# Patient Record
Sex: Male | Born: 1935
Health system: Southern US, Community
[De-identification: ages and names within clinical notes are randomized; demographics above are authoritative.]

## PROBLEM LIST (undated history)

## (undated) DIAGNOSIS — L309 Dermatitis, unspecified: Secondary | ICD-10-CM

## (undated) DIAGNOSIS — F419 Anxiety disorder, unspecified: Secondary | ICD-10-CM

## (undated) DIAGNOSIS — M1990 Unspecified osteoarthritis, unspecified site: Secondary | ICD-10-CM

## (undated) DIAGNOSIS — I739 Peripheral vascular disease, unspecified: Secondary | ICD-10-CM

## (undated) DIAGNOSIS — K219 Gastro-esophageal reflux disease without esophagitis: Secondary | ICD-10-CM

## (undated) DIAGNOSIS — M545 Low back pain, unspecified: Secondary | ICD-10-CM

## (undated) DIAGNOSIS — I447 Left bundle-branch block, unspecified: Secondary | ICD-10-CM

## (undated) DIAGNOSIS — C609 Malignant neoplasm of penis, unspecified: Secondary | ICD-10-CM

## (undated) DIAGNOSIS — D649 Anemia, unspecified: Secondary | ICD-10-CM

## (undated) DIAGNOSIS — H9193 Unspecified hearing loss, bilateral: Secondary | ICD-10-CM

## (undated) DIAGNOSIS — R351 Nocturia: Secondary | ICD-10-CM

## (undated) DIAGNOSIS — Z9289 Personal history of other medical treatment: Secondary | ICD-10-CM

## (undated) DIAGNOSIS — I442 Atrioventricular block, complete: Secondary | ICD-10-CM

## (undated) DIAGNOSIS — E039 Hypothyroidism, unspecified: Secondary | ICD-10-CM

## (undated) DIAGNOSIS — F329 Major depressive disorder, single episode, unspecified: Secondary | ICD-10-CM

## (undated) DIAGNOSIS — E785 Hyperlipidemia, unspecified: Secondary | ICD-10-CM

## (undated) DIAGNOSIS — S82002A Unspecified fracture of left patella, initial encounter for closed fracture: Secondary | ICD-10-CM

## (undated) DIAGNOSIS — G8929 Other chronic pain: Secondary | ICD-10-CM

## (undated) DIAGNOSIS — I10 Essential (primary) hypertension: Secondary | ICD-10-CM

## (undated) HISTORY — DX: Personal history of other medical treatment: Z92.89

## (undated) HISTORY — DX: Anemia, unspecified: D64.9

## (undated) HISTORY — DX: Unspecified fracture of left patella, initial encounter for closed fracture: S82.002A

## (undated) HISTORY — DX: Major depressive disorder, single episode, unspecified: F32.9

## (undated) HISTORY — DX: Gastro-esophageal reflux disease without esophagitis: K21.9

## (undated) HISTORY — DX: Dermatitis, unspecified: L30.9

## (undated) HISTORY — DX: Hyperlipidemia, unspecified: E78.5

## (undated) HISTORY — PX: OTHER SURGICAL HISTORY: SHX169

## (undated) HISTORY — DX: Essential (primary) hypertension: I10

## (undated) HISTORY — DX: Low back pain: M54.5

## (undated) HISTORY — PX: TONSILLECTOMY: SUR1361

## (undated) HISTORY — DX: Low back pain, unspecified: M54.50

## (undated) HISTORY — PX: INGUINAL HERNIA REPAIR: SUR1180

## (undated) HISTORY — DX: Other chronic pain: G89.29

## (undated) HISTORY — PX: INCISION AND DRAINAGE DEEP NECK ABSCESS: SHX1797

---

## 1997-11-05 ENCOUNTER — Other Ambulatory Visit: Admission: RE | Admit: 1997-11-05 | Discharge: 1997-11-05 | Payer: Self-pay | Admitting: Family Medicine

## 1998-10-31 ENCOUNTER — Other Ambulatory Visit: Admission: RE | Admit: 1998-10-31 | Discharge: 1998-10-31 | Payer: Self-pay | Admitting: Urology

## 2003-08-02 ENCOUNTER — Other Ambulatory Visit: Admission: RE | Admit: 2003-08-02 | Discharge: 2003-08-02 | Payer: Self-pay | Admitting: Dermatology

## 2004-05-29 ENCOUNTER — Ambulatory Visit: Payer: Self-pay | Admitting: Family Medicine

## 2004-06-11 ENCOUNTER — Ambulatory Visit: Payer: Self-pay | Admitting: Family Medicine

## 2004-09-17 ENCOUNTER — Ambulatory Visit: Payer: Self-pay | Admitting: Family Medicine

## 2005-04-28 ENCOUNTER — Ambulatory Visit: Payer: Self-pay | Admitting: Family Medicine

## 2005-05-01 ENCOUNTER — Ambulatory Visit: Payer: Self-pay

## 2006-06-01 ENCOUNTER — Ambulatory Visit: Payer: Self-pay | Admitting: Family Medicine

## 2006-06-01 LAB — CONVERTED CEMR LAB
ALT: 53 units/L — ABNORMAL HIGH (ref 0–40)
AST: 44 units/L — ABNORMAL HIGH (ref 0–37)
Albumin: 4.6 g/dL (ref 3.5–5.2)
Alkaline Phosphatase: 60 units/L (ref 39–117)
BUN: 17 mg/dL (ref 6–23)
Basophils Absolute: 0 10*3/uL (ref 0.0–0.1)
Basophils Relative: 0.5 % (ref 0.0–1.0)
CO2: 32 meq/L (ref 19–32)
Calcium: 9.8 mg/dL (ref 8.4–10.5)
Chloride: 101 meq/L (ref 96–112)
Chol/HDL Ratio, serum: 3.6
Cholesterol: 153 mg/dL (ref 0–200)
Creatinine, Ser: 1.3 mg/dL (ref 0.4–1.5)
Eosinophil percent: 1.5 % (ref 0.0–5.0)
GFR calc non Af Amer: 58 mL/min
Glomerular Filtration Rate, Af Am: 70 mL/min/{1.73_m2}
Glucose, Bld: 90 mg/dL (ref 70–99)
HCT: 48.5 % (ref 39.0–52.0)
HDL: 42.6 mg/dL (ref >39.0)
Hemoglobin: 16.1 g/dL (ref 13.0–17.0)
LDL Cholesterol: 85 mg/dL (ref 0–99)
Lymphocytes Relative: 19.3 % (ref 12.0–46.0)
MCHC: 33.3 g/dL (ref 30.0–36.0)
MCV: 81.1 fL (ref 78.0–100.0)
Monocytes Absolute: 0.7 10*3/uL (ref 0.2–0.7)
Monocytes Relative: 7.7 % (ref 3.0–11.0)
Neutro Abs: 6.3 10*3/uL (ref 1.4–7.7)
Neutrophils Relative %: 71 % (ref 43.0–77.0)
PSA: 3.55 ng/mL (ref 0.10–4.00)
Platelets: 166 10*3/uL (ref 150–400)
Potassium: 3.5 meq/L (ref 3.5–5.1)
RBC: 5.97 M/uL — ABNORMAL HIGH (ref 4.22–5.81)
RDW: 13.1 % (ref 11.5–14.6)
Sodium: 143 meq/L (ref 135–145)
TSH: 3.78 microintl units/mL (ref 0.35–5.50)
Total Bilirubin: 0.7 mg/dL (ref 0.3–1.2)
Total Protein: 7 g/dL (ref 6.0–8.3)
Triglyceride fasting, serum: 128 mg/dL (ref 0–149)
VLDL: 26 mg/dL (ref 0–40)
WBC: 8.8 10*3/uL (ref 4.5–10.5)

## 2006-06-09 ENCOUNTER — Ambulatory Visit: Payer: Self-pay | Admitting: Family Medicine

## 2006-06-09 ENCOUNTER — Encounter: Admission: RE | Admit: 2006-06-09 | Discharge: 2006-06-09 | Payer: Self-pay | Admitting: Family Medicine

## 2006-09-09 ENCOUNTER — Ambulatory Visit: Payer: Self-pay | Admitting: Family Medicine

## 2006-09-09 LAB — CONVERTED CEMR LAB
ALT: 40 units/L (ref 0–40)
AST: 38 units/L — ABNORMAL HIGH (ref 0–37)
Albumin: 4.3 g/dL (ref 3.5–5.2)
Alkaline Phosphatase: 56 units/L (ref 39–117)
Bilirubin, Direct: 0.2 mg/dL (ref 0.0–0.3)
Total Bilirubin: 0.8 mg/dL (ref 0.3–1.2)
Total Protein: 6.6 g/dL (ref 6.0–8.3)

## 2006-09-15 ENCOUNTER — Ambulatory Visit: Payer: Self-pay | Admitting: Family Medicine

## 2006-09-16 ENCOUNTER — Ambulatory Visit: Payer: Self-pay | Admitting: Family Medicine

## 2006-09-16 LAB — CONVERTED CEMR LAB
BUN: 15 mg/dL (ref 6–23)
Bacteria, UA: NEGATIVE
Bilirubin Urine: NEGATIVE
Creatinine, Ser: 1.3 mg/dL (ref 0.4–1.5)
Crystals: NEGATIVE
Hemoglobin, Urine: NEGATIVE
Ketones, ur: NEGATIVE mg/dL
Leukocytes, UA: NEGATIVE
Mucus, UA: NEGATIVE
Nitrite: NEGATIVE
RBC / HPF: NONE SEEN
Specific Gravity, Urine: <=1.005 (ref 1.000–1.03)
Total Protein, Urine: NEGATIVE mg/dL
Urine Glucose: NEGATIVE mg/dL
Urobilinogen, UA: 0.2 (ref 0.0–1.0)
pH: 6 (ref 5.0–8.0)

## 2006-09-17 ENCOUNTER — Ambulatory Visit: Payer: Self-pay | Admitting: Cardiology

## 2006-11-24 ENCOUNTER — Ambulatory Visit: Payer: Self-pay | Admitting: Family Medicine

## 2007-02-02 DIAGNOSIS — K219 Gastro-esophageal reflux disease without esophagitis: Secondary | ICD-10-CM | POA: Insufficient documentation

## 2007-02-02 DIAGNOSIS — E785 Hyperlipidemia, unspecified: Secondary | ICD-10-CM | POA: Insufficient documentation

## 2007-02-02 DIAGNOSIS — I10 Essential (primary) hypertension: Secondary | ICD-10-CM

## 2007-02-02 DIAGNOSIS — E782 Mixed hyperlipidemia: Secondary | ICD-10-CM | POA: Insufficient documentation

## 2007-02-02 DIAGNOSIS — F324 Major depressive disorder, single episode, in partial remission: Secondary | ICD-10-CM

## 2007-02-02 DIAGNOSIS — J309 Allergic rhinitis, unspecified: Secondary | ICD-10-CM | POA: Insufficient documentation

## 2007-02-02 DIAGNOSIS — F325 Major depressive disorder, single episode, in full remission: Secondary | ICD-10-CM | POA: Insufficient documentation

## 2007-02-02 HISTORY — DX: Essential (primary) hypertension: I10

## 2007-02-02 HISTORY — DX: Allergic rhinitis, unspecified: J30.9

## 2007-02-02 HISTORY — DX: Major depressive disorder, single episode, in partial remission: F32.4

## 2008-01-19 ENCOUNTER — Ambulatory Visit: Payer: Self-pay | Admitting: Family Medicine

## 2008-01-19 DIAGNOSIS — L2089 Other atopic dermatitis: Secondary | ICD-10-CM | POA: Insufficient documentation

## 2008-01-19 DIAGNOSIS — N410 Acute prostatitis: Secondary | ICD-10-CM | POA: Insufficient documentation

## 2008-01-19 DIAGNOSIS — R3915 Urgency of urination: Secondary | ICD-10-CM | POA: Insufficient documentation

## 2008-01-19 LAB — CONVERTED CEMR LAB
Bilirubin Urine: NEGATIVE
Blood in Urine, dipstick: NEGATIVE
Glucose, Urine, Semiquant: NEGATIVE
Ketones, urine, test strip: NEGATIVE
Nitrite: NEGATIVE
Protein, U semiquant: NEGATIVE
Specific Gravity, Urine: 1.015
Urobilinogen, UA: 0.2
WBC Urine, dipstick: NEGATIVE
pH: 7

## 2008-01-24 ENCOUNTER — Telehealth: Payer: Self-pay | Admitting: Family Medicine

## 2008-02-02 ENCOUNTER — Ambulatory Visit: Payer: Self-pay | Admitting: Family Medicine

## 2008-02-02 DIAGNOSIS — L259 Unspecified contact dermatitis, unspecified cause: Secondary | ICD-10-CM | POA: Insufficient documentation

## 2008-08-15 ENCOUNTER — Ambulatory Visit: Payer: Self-pay | Admitting: Family Medicine

## 2008-08-15 DIAGNOSIS — T50995A Adverse effect of other drugs, medicaments and biological substances, initial encounter: Secondary | ICD-10-CM | POA: Insufficient documentation

## 2008-08-15 DIAGNOSIS — D649 Anemia, unspecified: Secondary | ICD-10-CM | POA: Insufficient documentation

## 2008-08-15 DIAGNOSIS — D409 Neoplasm of uncertain behavior of male genital organ, unspecified: Secondary | ICD-10-CM | POA: Insufficient documentation

## 2008-08-15 DIAGNOSIS — E039 Hypothyroidism, unspecified: Secondary | ICD-10-CM | POA: Insufficient documentation

## 2008-08-15 DIAGNOSIS — E559 Vitamin D deficiency, unspecified: Secondary | ICD-10-CM | POA: Insufficient documentation

## 2008-08-15 DIAGNOSIS — I878 Other specified disorders of veins: Secondary | ICD-10-CM | POA: Insufficient documentation

## 2008-08-15 HISTORY — DX: Vitamin D deficiency, unspecified: E55.9

## 2008-09-26 ENCOUNTER — Telehealth: Payer: Self-pay | Admitting: Family Medicine

## 2009-08-13 ENCOUNTER — Ambulatory Visit: Payer: Self-pay | Admitting: Family Medicine

## 2009-08-13 DIAGNOSIS — H1030 Unspecified acute conjunctivitis, unspecified eye: Secondary | ICD-10-CM | POA: Insufficient documentation

## 2009-09-05 ENCOUNTER — Ambulatory Visit: Payer: Self-pay | Admitting: Family Medicine

## 2009-09-05 DIAGNOSIS — R350 Frequency of micturition: Secondary | ICD-10-CM

## 2009-09-05 HISTORY — DX: Frequency of micturition: R35.0

## 2009-10-10 ENCOUNTER — Telehealth: Payer: Self-pay | Admitting: Family Medicine

## 2009-11-20 ENCOUNTER — Telehealth: Payer: Self-pay | Admitting: Family Medicine

## 2010-03-19 ENCOUNTER — Ambulatory Visit: Payer: Self-pay | Admitting: Family Medicine

## 2010-03-19 DIAGNOSIS — K5900 Constipation, unspecified: Secondary | ICD-10-CM | POA: Insufficient documentation

## 2010-03-19 DIAGNOSIS — K1370 Unspecified lesions of oral mucosa: Secondary | ICD-10-CM | POA: Insufficient documentation

## 2010-03-19 DIAGNOSIS — M545 Low back pain: Secondary | ICD-10-CM

## 2010-03-19 DIAGNOSIS — M542 Cervicalgia: Secondary | ICD-10-CM | POA: Insufficient documentation

## 2010-03-21 DIAGNOSIS — M4802 Spinal stenosis, cervical region: Secondary | ICD-10-CM | POA: Insufficient documentation

## 2010-03-21 HISTORY — DX: Spinal stenosis, cervical region: M48.02

## 2010-04-23 ENCOUNTER — Ambulatory Visit: Payer: Self-pay | Admitting: Family Medicine

## 2010-04-23 DIAGNOSIS — B379 Candidiasis, unspecified: Secondary | ICD-10-CM | POA: Insufficient documentation

## 2010-04-23 DIAGNOSIS — R269 Unspecified abnormalities of gait and mobility: Secondary | ICD-10-CM | POA: Insufficient documentation

## 2010-04-30 ENCOUNTER — Encounter: Payer: Self-pay | Admitting: Family Medicine

## 2010-05-08 ENCOUNTER — Ambulatory Visit (HOSPITAL_BASED_OUTPATIENT_CLINIC_OR_DEPARTMENT_OTHER): Admission: RE | Admit: 2010-05-08 | Discharge: 2010-05-08 | Payer: Self-pay | Admitting: Urology

## 2010-05-09 HISTORY — PX: OTHER SURGICAL HISTORY: SHX169

## 2010-05-12 ENCOUNTER — Ambulatory Visit: Payer: Self-pay | Admitting: Family Medicine

## 2010-05-12 DIAGNOSIS — I447 Left bundle-branch block, unspecified: Secondary | ICD-10-CM | POA: Insufficient documentation

## 2010-05-13 DIAGNOSIS — C601 Malignant neoplasm of glans penis: Secondary | ICD-10-CM

## 2010-05-13 DIAGNOSIS — C609 Malignant neoplasm of penis, unspecified: Secondary | ICD-10-CM

## 2010-05-13 HISTORY — DX: Malignant neoplasm of penis, unspecified: C60.9

## 2010-05-13 HISTORY — DX: Malignant neoplasm of glans penis: C60.1

## 2010-05-13 LAB — CONVERTED CEMR LAB
ALT: 27 units/L (ref 0–53)
AST: 33 units/L (ref 0–37)
Albumin: 4.3 g/dL (ref 3.5–5.2)
Alkaline Phosphatase: 56 units/L (ref 39–117)
BUN: 20 mg/dL (ref 6–23)
Basophils Absolute: 0 10*3/uL (ref 0.0–0.1)
Basophils Relative: 0.3 % (ref 0.0–3.0)
Bilirubin, Direct: 0.1 mg/dL (ref 0.0–0.3)
CO2: 29 meq/L (ref 19–32)
Calcium: 9 mg/dL (ref 8.4–10.5)
Chloride: 98 meq/L (ref 96–112)
Creatinine, Ser: 1.3 mg/dL (ref 0.4–1.5)
Eosinophils Absolute: 0.1 10*3/uL (ref 0.0–0.7)
Eosinophils Relative: 0.8 % (ref 0.0–5.0)
GFR calc non Af Amer: 55.84 mL/min (ref >60)
Glucose, Bld: 81 mg/dL (ref 70–99)
HCT: 45.4 % (ref 39.0–52.0)
Hemoglobin: 15.7 g/dL (ref 13.0–17.0)
Lymphocytes Relative: 26.8 % (ref 12.0–46.0)
Lymphs Abs: 2.2 10*3/uL (ref 0.7–4.0)
MCHC: 34.5 g/dL (ref 30.0–36.0)
MCV: 80.3 fL (ref 78.0–100.0)
Monocytes Absolute: 0.6 10*3/uL (ref 0.1–1.0)
Monocytes Relative: 7.4 % (ref 3.0–12.0)
Neutro Abs: 5.4 10*3/uL (ref 1.4–7.7)
Neutrophils Relative %: 64.7 % (ref 43.0–77.0)
Phosphorus: 2.7 mg/dL (ref 2.3–4.6)
Platelets: 122 10*3/uL — ABNORMAL LOW (ref 150.0–400.0)
Potassium: 3.5 meq/L (ref 3.5–5.1)
RBC: 5.66 M/uL (ref 4.22–5.81)
RDW: 15 % — ABNORMAL HIGH (ref 11.5–14.6)
Sodium: 142 meq/L (ref 135–145)
TSH: 3.31 microintl units/mL (ref 0.35–5.50)
Total Bilirubin: 0.7 mg/dL (ref 0.3–1.2)
Total Protein: 6.8 g/dL (ref 6.0–8.3)
WBC: 8.3 10*3/uL (ref 4.5–10.5)

## 2010-05-14 ENCOUNTER — Encounter: Payer: Self-pay | Admitting: Family Medicine

## 2010-05-14 ENCOUNTER — Ambulatory Visit: Payer: Self-pay | Admitting: Cardiology

## 2010-05-14 DIAGNOSIS — R9431 Abnormal electrocardiogram [ECG] [EKG]: Secondary | ICD-10-CM | POA: Insufficient documentation

## 2010-05-15 ENCOUNTER — Encounter: Payer: Self-pay | Admitting: Cardiology

## 2010-05-16 ENCOUNTER — Telehealth: Payer: Self-pay | Admitting: Cardiology

## 2010-05-27 ENCOUNTER — Telehealth (INDEPENDENT_AMBULATORY_CARE_PROVIDER_SITE_OTHER): Payer: Self-pay | Admitting: *Deleted

## 2010-05-28 ENCOUNTER — Ambulatory Visit: Payer: Self-pay

## 2010-05-28 ENCOUNTER — Encounter (HOSPITAL_COMMUNITY)
Admission: RE | Admit: 2010-05-28 | Discharge: 2010-07-12 | Payer: Self-pay | Source: Home / Self Care | Attending: Cardiology | Admitting: Cardiology

## 2010-05-28 ENCOUNTER — Encounter: Payer: Self-pay | Admitting: Cardiovascular Disease

## 2010-05-28 ENCOUNTER — Ambulatory Visit: Payer: Self-pay | Admitting: Cardiovascular Disease

## 2010-05-29 ENCOUNTER — Encounter (INDEPENDENT_AMBULATORY_CARE_PROVIDER_SITE_OTHER): Payer: Self-pay | Admitting: *Deleted

## 2010-06-02 ENCOUNTER — Ambulatory Visit (HOSPITAL_BASED_OUTPATIENT_CLINIC_OR_DEPARTMENT_OTHER): Admission: RE | Admit: 2010-06-02 | Discharge: 2010-06-02 | Payer: Self-pay | Admitting: Urology

## 2010-06-02 HISTORY — PX: OTHER SURGICAL HISTORY: SHX169

## 2010-06-11 ENCOUNTER — Encounter: Payer: Self-pay | Admitting: Cardiology

## 2010-06-24 ENCOUNTER — Ambulatory Visit: Payer: Self-pay | Admitting: Internal Medicine

## 2010-06-24 DIAGNOSIS — L0390 Cellulitis, unspecified: Secondary | ICD-10-CM

## 2010-06-24 DIAGNOSIS — L0291 Cutaneous abscess, unspecified: Secondary | ICD-10-CM | POA: Insufficient documentation

## 2010-06-27 ENCOUNTER — Encounter
Admission: RE | Admit: 2010-06-27 | Discharge: 2010-06-27 | Payer: Self-pay | Source: Home / Self Care | Attending: Neurosurgery | Admitting: Neurosurgery

## 2010-07-03 ENCOUNTER — Encounter: Payer: Self-pay | Admitting: Family Medicine

## 2010-08-10 LAB — CONVERTED CEMR LAB
ALT: 31 units/L (ref 0–53)
ALT: 32 units/L (ref 0–53)
AST: 33 units/L (ref 0–37)
AST: 34 units/L (ref 0–37)
Albumin: 4.4 g/dL (ref 3.5–5.2)
Albumin: 4.9 g/dL (ref 3.5–5.2)
Alkaline Phosphatase: 55 units/L (ref 39–117)
Alkaline Phosphatase: 64 units/L (ref 39–117)
BUN: 15 mg/dL (ref 6–23)
BUN: 18 mg/dL (ref 6–23)
Basophils Absolute: 0 10*3/uL (ref 0.0–0.1)
Basophils Absolute: 0 10*3/uL (ref 0.0–0.1)
Basophils Relative: 0.2 % (ref 0.0–3.0)
Basophils Relative: 0.2 % (ref 0.0–3.0)
Bilirubin Urine: NEGATIVE
Bilirubin Urine: NEGATIVE
Bilirubin, Direct: 0.1 mg/dL (ref 0.0–0.3)
Bilirubin, Direct: 0.2 mg/dL (ref 0.0–0.3)
Blood in Urine, dipstick: NEGATIVE
Blood in Urine, dipstick: NEGATIVE
CO2: 29 meq/L (ref 19–32)
CO2: 30 meq/L (ref 19–32)
Calcium: 9.7 mg/dL (ref 8.4–10.5)
Calcium: 9.7 mg/dL (ref 8.4–10.5)
Chloride: 104 meq/L (ref 96–112)
Chloride: 105 meq/L (ref 96–112)
Cholesterol: 144 mg/dL (ref 0–200)
Cholesterol: 146 mg/dL (ref 0–200)
Creatinine, Ser: 1.3 mg/dL (ref 0.4–1.5)
Creatinine, Ser: 1.5 mg/dL (ref 0.4–1.5)
Direct LDL: 93.8 mg/dL
Eosinophils Absolute: 0.1 10*3/uL (ref 0.0–0.7)
Eosinophils Absolute: 0.1 10*3/uL (ref 0.0–0.7)
Eosinophils Relative: 0.9 % (ref 0.0–5.0)
Eosinophils Relative: 0.9 % (ref 0.0–5.0)
GFR calc Af Amer: 70 mL/min
GFR calc non Af Amer: 48.7 mL/min (ref >60)
GFR calc non Af Amer: 58 mL/min
Glucose, Bld: 116 mg/dL — ABNORMAL HIGH (ref 70–99)
Glucose, Bld: 95 mg/dL (ref 70–99)
Glucose, Urine, Semiquant: NEGATIVE
Glucose, Urine, Semiquant: NEGATIVE
HCT: 47 % (ref 39.0–52.0)
HCT: 48.6 % (ref 39.0–52.0)
HDL: 35.1 mg/dL — ABNORMAL LOW (ref >39.0)
HDL: 41.8 mg/dL (ref >39.00)
Hemoglobin: 15.7 g/dL (ref 13.0–17.0)
Hemoglobin: 16.2 g/dL (ref 13.0–17.0)
Ketones, urine, test strip: NEGATIVE
Ketones, urine, test strip: NEGATIVE
LDL Cholesterol: 73 mg/dL (ref 0–99)
Lymphocytes Relative: 19.6 % (ref 12.0–46.0)
Lymphocytes Relative: 23.8 % (ref 12.0–46.0)
Lymphs Abs: 1.6 10*3/uL (ref 0.7–4.0)
MCHC: 32.3 g/dL (ref 30.0–36.0)
MCHC: 34.6 g/dL (ref 30.0–36.0)
MCV: 78 fL (ref 78.0–100.0)
MCV: 82.6 fL (ref 78.0–100.0)
Monocytes Absolute: 0.6 10*3/uL (ref 0.1–1.0)
Monocytes Absolute: 0.7 10*3/uL (ref 0.1–1.0)
Monocytes Relative: 7.9 % (ref 3.0–12.0)
Monocytes Relative: 8.6 % (ref 3.0–12.0)
Neutro Abs: 4.7 10*3/uL (ref 1.4–7.7)
Neutro Abs: 5.7 10*3/uL (ref 1.4–7.7)
Neutrophils Relative %: 67.2 % (ref 43.0–77.0)
Neutrophils Relative %: 70.7 % (ref 43.0–77.0)
Nitrite: NEGATIVE
Nitrite: NEGATIVE
PSA: 3.21 ng/mL (ref 0.10–4.00)
PSA: 3.26 ng/mL (ref 0.10–4.00)
Platelets: 115 10*3/uL — ABNORMAL LOW (ref 150.0–400.0)
Platelets: 124 10*3/uL — ABNORMAL LOW (ref 150–400)
Potassium: 3.5 meq/L (ref 3.5–5.1)
Potassium: 3.6 meq/L (ref 3.5–5.1)
Protein, U semiquant: NEGATIVE
RBC: 5.88 M/uL — ABNORMAL HIGH (ref 4.22–5.81)
RBC: 6.03 M/uL — ABNORMAL HIGH (ref 4.22–5.81)
RDW: 13 % (ref 11.5–14.6)
RDW: 13.1 % (ref 11.5–14.6)
Sodium: 141 meq/L (ref 135–145)
Sodium: 143 meq/L (ref 135–145)
Specific Gravity, Urine: 1.015
Specific Gravity, Urine: 1.015
TSH: 2.97 microintl units/mL (ref 0.35–5.50)
TSH: 3.21 microintl units/mL (ref 0.35–5.50)
Total Bilirubin: 0.4 mg/dL (ref 0.3–1.2)
Total Bilirubin: 0.7 mg/dL (ref 0.3–1.2)
Total CHOL/HDL Ratio: 3
Total CHOL/HDL Ratio: 4.1
Total Protein: 6.8 g/dL (ref 6.0–8.3)
Total Protein: 7.4 g/dL (ref 6.0–8.3)
Triglycerides: 182 mg/dL — ABNORMAL HIGH (ref 0–149)
Triglycerides: 218 mg/dL — ABNORMAL HIGH (ref 0.0–149.0)
Urobilinogen, UA: 0.2
Urobilinogen, UA: 0.2
VLDL: 36 mg/dL (ref 0–40)
VLDL: 43.6 mg/dL — ABNORMAL HIGH (ref 0.0–40.0)
Vit D, 1,25-Dihydroxy: 29 — ABNORMAL LOW (ref 30–89)
Vit D, 25-Hydroxy: 40 ng/mL (ref 30–89)
WBC: 7.1 10*3/uL (ref 4.5–10.5)
WBC: 8.1 10*3/uL (ref 4.5–10.5)
pH: 6.5
pH: 7

## 2010-08-14 NOTE — Letter (Signed)
Summary: [facility] Office Visit Note   [facility] Office Visit Note   Imported By: Roderic Ovens 05/29/2010 16:28:34  _____________________________________________________________________  External Attachment:    Type:   Image     Comment:   External Document

## 2010-08-14 NOTE — Assessment & Plan Note (Signed)
Summary: np6/ LBBB ON EXAM -SURGERY CLEARANCE - PT HAS SECURE HORIZONE...   Visit Type:  Initial Consult   History of Present Illness: 77 year old male for evaluation of abnormal electrocardiogram and preoperatively. No prior cardiac history. Patient recently noted to have left bundle branch block on electrocardiogram. He does note increased dyspnea on exertion for one year. It is relieved with rest. It is not associated with chest pain. There is no orthopnea or PND but there is occasional mild pedal edema which improves with diuretics. He does not have exertional chest pain, palpitations or syncope. Note he may also require a urological procedure. Because of his abnormal electrocardiogram we were asked to further evaluate.  Current Medications (verified): 1)  Lorcet 10/650 10-650 Mg Tabs (Hydrocodone-Acetaminophen) .Marland Kitchen.. 1 Tablet By Mouth Every Four To Six Hours 2)  Zoloft 100 Mg  Tabs (Sertraline Hcl) .... Two Times A Day 3)  Amitriptyline Hcl 100 Mg  Tabs (Amitriptyline Hcl) .... Take 1 At Bedtime 4)  Meprobamate 400 Mg  Tabs (Meprobamate) .Marland Kitchen.. 1 By Mouth Four Times A Day As Needed 5)  Baby Aspirin 81 Mg  Chew (Aspirin) .Marland Kitchen.. 1 Once Daily 6)  Triamterene-Hctz 75-50 Mg  Tabs (Triamterene-Hctz) .Marland Kitchen.. 1 By Mouth Once Daily 7)  Prilosec 20 Mg  Cpdr (Omeprazole) .... Otc 8)  Tobramycin Sulfate 0.3 % Soln (Tobramycin Sulfate) .Marland Kitchen.. 1 Qtt in Left Eye Tid 9)  Bactroban 2 % Oint (Mupirocin) .... Apply To Skin Below Left Eye Tid 10)  Vesicare 5 Mg Tabs (Solifenacin Succinate) .Marland Kitchen.. 1 By Mouth Once Daily 11)  Valtrex 500 Mg Tabs (Valacyclovir Hcl) .... 2 Tabs By Mouth Three Times A Day  For Herpes Infection X 7 Days and Then Decrease Back To 1 Bid 12)  First-Dukes Mouthwash  Susp (Diphenhyd-Hydrocort-Nystatin) .... 5 Cc By Mouth Qid As Needed Lesions X 14 Days 13)  Ketoconazole 2 % Crea (Ketoconazole) .... Apply Two Times A Day As Needed Rash 14)  Simvastatin 40 Mg Tabs (Simvastatin) .... Take 1/2 At  Night 15)  Furosemide 20 Mg Tabs (Furosemide) .Marland Kitchen.. 1 Tab By Mouth Daily As Needed For Sob/edema/wt Gain > 3# in 24 Hr  Allergies: 1)  Sulfamethoxazole (Sulfamethoxazole)  Past History:  Past Medical History: HYPERTENSION  HYPERLIPIDEMIA LBBB  SPINAL STENOSIS, CERVICAL LOW BACK PAIN, CHRONIC ECZEMA  VITAMIN D DEFICIENCY ANEMIA  GERD  DEPRESSION  Penile lesion H[facility]  Past Surgical History: Rt wrist tumor hrnioraphy early 70s Neck abscess drained tonsillectomy  Family History: Reviewed history from 02/02/2007 and no changes required. Family History of Arthritis Family History High cholesterol Family History Hypertension Family History Other cancer-Prostate No premature CAD  Social History: Reviewed history from 02/02/2007 and no changes required. Retired Divorced Never Smoked Alcohol use-no Drug use-no Regular exercise-no  Review of Systems       Problems with back pain but no fevers or chills, productive cough, hemoptysis, dysphasia, odynophagia, melena, hematochezia, dysuria, hematuria, rash, seizure activity, orthopnea, PND,  claudication. Remaining systems are negative.   Vital Signs:  Patient profile:   77 year old male Height:      71 inches Weight:      234 pounds Pulse rate:   76 / minute Pulse rhythm:   regular BP sitting:   140 / 84  (left arm)  Vitals Entered By: Jacquelin Hawking, CMA (May 14, 2010 2:53 PM)  Physical Exam  General:  Well developed/well nourished in NAD Skin warm/dry; multiple moles Patient not depressed No peripheral clubbing Back-normal  HEENT-normal/normal eyelids Neck supple/normal carotid upstroke bilaterally; no bruits; no JVD; no thyromegaly chest - CTA/ normal expansion CV - RRR/normal S1 and S2; no murmurs, rubs or gallops;  PMI nondisplaced Abdomen -NT/ND, no HSM, no mass, + bowel sounds, no bruit 2+ femoral pulses, no bruits Ext-no edema, chords, 2+ DP Neuro-grossly  nonfocal     EKG  Procedure date:  05/14/2010  Findings:      Sinus rhythm at a rate of 78. Left bundle branch block.  Impression & Recommendations:  Problem # 1:  LBBB (ICD-426.3) Also with complaints of dyspnea. Schedule lexiscan myoview for risk stratification. His updated medication list for this problem includes:    Baby Aspirin 81 Mg Chew (Aspirin) .Marland Kitchen... 1 once daily  Problem # 2:  HYPERTENSION (ICD-401.9) Blood pressure controlled on present medications. Will continue. His updated medication list for this problem includes:    Baby Aspirin 81 Mg Chew (Aspirin) .Marland Kitchen... 1 once daily    Triamterene-hctz 75-50 Mg Tabs (Triamterene-hctz) .Marland Kitchen... 1 by mouth once daily    Furosemide 20 Mg Tabs (Furosemide) .Marland Kitchen... 1 tab by mouth daily as needed for sob/edema/wt gain > 3# in 24 hr  Orders: EKG w/ Interpretation (93000)  Problem # 3:  HYPERLIPIDEMIA (ICD-272.4) Management per primary care. His updated medication list for this problem includes:    Simvastatin 40 Mg Tabs (Simvastatin) .Marland Kitchen... Take 1/2 at night  Problem # 4:  GERD (ICD-530.81)  His updated medication list for this problem includes:    Prilosec 20 Mg Cpdr (Omeprazole) ..... Otc  His updated medication list for this problem includes:    Prilosec 20 Mg Cpdr (Omeprazole) ..... Otc  Other Orders: Nuclear Stress Test (Nuc Stress Test)  Patient Instructions: 1)  Your physician recommends that you schedule a follow-up appointment in: AS NEEDED PENDING TEST RESULTS 2)  Your physician has requested that you have an LEXISCAN myoview.  For further information please visit [email or web address]  Please follow instruction sheet, as given.

## 2010-08-14 NOTE — Assessment & Plan Note (Signed)
Summary: eye check/jlp   Vital Signs:  Patient profile:   78 year old male Weight:      245 pounds Temp:     98 degrees F BP sitting:   146 / 60  Vitals Entered By: Lynann Beaver CMA (August 13, 2009 5:17 PM) CC: Left eye pain Is Patient Diabetic? No Pain Assessment Patient in pain? no        History of Present Illness: This 77 year old white male patient with multiple problems comes in today cause of redness of the left eye as well as some pain and swollen area around the left eye and this is all occurred in the past 2 days His blood pressure has been fairly well controlled he has no other complaints at this time Discussed the fact that he needs a evaluation of his problems in the near future as necessary lab studies Is probable peripheral edema had been under good control Urinary didn't see as decreased  Allergies: 1)  Sulfamethoxazole (Sulfamethoxazole)  Past History:  Past Medical History: Last updated: 02/02/2007 Allergic rhinitis Depression GERD Hyperlipidemia Hypertension HOH-[facility]  Past Surgical History: Last updated: 02/02/2007 Rt wrist tumor  Social History: Last updated: 02/02/2007 Retired Divorced Never Smoked Alcohol use-no Drug use-no Regular exercise-no  Risk Factors: Smoking Status: never (02/02/2007)  Review of Systems  The patient denies anorexia, fever, weight loss, weight gain, vision loss, decreased hearing, hoarseness, chest pain, syncope, dyspnea on exertion, peripheral edema, prolonged cough, headaches, hemoptysis, abdominal pain, melena, hematochezia, severe indigestion/heartburn, hematuria, incontinence, genital sores, muscle weakness, suspicious skin lesions, transient blindness, difficulty walking, depression, unusual weight change, abnormal bleeding, enlarged lymph nodes, angioedema, breast masses, and testicular masses.    Physical Exam  General:  Well-developed,well-nourished,in no acute distress;  alert,appropriate and cooperative throughout examination Head:  Normocephalic and atraumatic without obvious abnormalities. No apparent alopecia or balding. Eyes:  right normal, can't talk, half of the left eye is erythematous with minimal amount of yellow drainage tissue around the left eye is swollen erythematous and tender Ears:  External ear exam shows no significant lesions or deformities.  Otoscopic examination reveals clear canals, tympanic membranes are intact bilaterally without bulging, retraction, inflammation or discharge. Hearing is grossly normal bilaterally. Nose:  External nasal examination shows no deformity or inflammation. Nasal mucosa are pink and moist without lesions or exudates. Mouth:  Oral mucosa and oropharynx without lesions or exudates.  Teeth in good repair. Lungs:  Normal respiratory effort, chest expands symmetrically. Lungs are clear to auscultation, no crackles or wheezes. Heart:  Normal rate and regular rhythm. S1 and S2 normal without gallop, murmur, click, rub or other extra sounds. Extremities:  No clubbing, cyanosis, edema, or deformity noted with normal full range of motion of all joints.     Impression & Recommendations:  Problem # 1:  CELLULITIS, FACE, LEFT (ICD-682.0) Assessment New  His updated medication list for this problem includes:    Doxycycline Hyclate 100 Mg Caps (Doxycycline hyclate) .Marland Kitchen... 1 two times a day for prostatitis    Doxycycline Hyclate 100 Mg Caps (Doxycycline hyclate) .Marland Kitchen... 1 two times a day for infection  Problem # 2:  CONJUNCTIVITIS, ACUTE, LEFT (ICD-372.00) Assessment: New  His updated medication list for this problem includes:    Tobramycin Sulfate 0.3 % Soln (Tobramycin sulfate) .Marland Kitchen... 1 qtt in left eye tid  Orders: Prescription Created Electronically [phone])  Problem # 3:  EDEMA (ICD-782.3) Assessment: Improved  His updated medication list for this problem includes:    Triamterene-hctz  75-50 Mg Tabs  (Triamterene-hctz) .Marland Kitchen... 1 by mouth once daily  Problem # 4:  HYPERTENSION (ICD-401.9) Assessment: Improved  His updated medication list for this problem includes:    Triamterene-hctz 75-50 Mg Tabs (Triamterene-hctz) .Marland Kitchen... 1 by mouth once daily  Complete Medication List: 1)  Lorcet 10/650 10-650 Mg Tabs (Hydrocodone-acetaminophen) .Marland Kitchen.. 1 tablet by mouth every four to six hours 2)  Zoloft 100 Mg Tabs (Sertraline hcl) .... Two times a day 3)  Amitriptyline Hcl 100 Mg Tabs (Amitriptyline hcl) .... Take 1 at bedtime 4)  Meprobamate 400 Mg Tabs (Meprobamate) .Marland Kitchen.. 1 by mouth four times a day 5)  Baby Aspirin 81 Mg Chew (Aspirin) .Marland Kitchen.. 1 once daily 6)  Triamterene-hctz 75-50 Mg Tabs (Triamterene-hctz) .Marland Kitchen.. 1 by mouth once daily 7)  Doxycycline Hyclate 100 Mg Caps (Doxycycline hyclate) .Marland Kitchen.. 1 two times a day for prostatitis 8)  Simvastatin 80 Mg Tabs (Simvastatin) .Marland Kitchen.. 1 hs for cholesterol elevation 9)  Prilosec 20 Mg Cpdr (Omeprazole) .Marland Kitchen.. 1 qd 10)  Lotrisone 1-0.05 % Crea (Clotrimazole-betamethasone) .... Apply to lesion two times a day for 14 days 11)  Vitamin D [address]4540 Unit Caps (Ergocalciferol) .Marland Kitchen.. 1 by mouth weekly. 12)  Detrol La 4 Mg Xr24h-cap (Tolterodine tartrate) .Marland Kitchen.. 1 by mouth once daily 13)  Tobramycin Sulfate 0.3 % Soln (Tobramycin sulfate) .Marland Kitchen.. 1 qtt in left eye tid 14)  Bactroban 2 % Oint (Mupirocin) .... Apply to skin below left eye tid 15)  Doxycycline Hyclate 100 Mg Caps (Doxycycline hyclate) .Marland Kitchen.. 1 two times a day for infection  Patient Instructions: 1)  conjunctivitis left eye and cellulitis under left eyelid 2)  tobramycin drops three times a day 3)  Bactroban oint three times a day under eye lid 4)  if swelling and rednes gets worse under eye get the doxycycline prescription filled 5)  return for full evaluation of problems Prescriptions: AMITRIPTYLINE HCL 100 MG  TABS (AMITRIPTYLINE HCL) take 1 at bedtime  #30 x 0   Entered by:   Pura Spice, RN   Authorized by:    Judithann Sheen MD   Signed by:   Pura Spice, RN on 08/22/2009   Method used:   Electronically to        [facility] (retail)       [address]       [place], [place]  [address]       Ph: [phone]       Fax: [phone]   RxID:   [id number] SIMVASTATIN 80 MG  TABS (SIMVASTATIN) 1 hs for cholesterol elevation  #30 x 1   Entered by:   Pura Spice, RN   Authorized by:   Judithann Sheen MD   Signed by:   Pura Spice, RN on 08/22/2009   Method used:   Electronically to        [facility].* (retail)       [address]       [place], [place]  [address]       Ph: [phone]       Fax: [phone]   RxID:   [id number] DOXYCYCLINE HYCLATE 100 MG CAPS (DOXYCYCLINE HYCLATE) 1 two times a day for infection  #20 x 0   Entered and Authorized by:   Judithann Sheen MD   Signed by:   Ivar Drape  Danice Goltz MD on 08/13/2009   Method used:   Print then Give to Patient   RxID:   [id number] BACTROBAN 2 % OINT (MUPIROCIN) apply to skin below left eye tid  #15 gms x 1   Entered and Authorized by:   Judithann Sheen MD   Signed by:   Judithann Sheen MD on 08/13/2009   Method used:   Electronically to        [facility] (retail)       [address]       [place], [place]  [address]       Ph: [phone]       Fax: [phone]   RxID:   [id number] TOBRAMYCIN SULFATE 0.3 % SOLN (TOBRAMYCIN SULFATE) 1 qtt in left eye tid  #5 cc x 1   Entered and Authorized by:   Judithann Sheen MD   Signed by:   Judithann Sheen MD on 08/13/2009   Method used:   Electronically to        [facility] (retail)       [address]       [place], [place]  [address]       Ph: [phone]       Fax: [phone]   RxID:   [id number]

## 2010-08-14 NOTE — Progress Notes (Signed)
Summary: nuc pre procedure  Phone Note Outgoing Call Call back at Home Phone [phone]   Call placed by: Cathlyn Parsons RN,  May 27, 2010 2:23 PM Call placed to: Patient Reason for Call: Confirm/change Appt Summary of Call: Reviewed information on Myoview Information Sheet (see scanned document for further details).  Spoke with patient.      Nuclear Med Background Indications for Stress Test: Evaluation for Ischemia, Surgical Clearance, Abnormal EKG  Indications Comments: Pending urological procedure    Symptoms: DOE    Nuclear Pre-Procedure Cardiac Risk Factors: Hypertension, LBBB, Lipids Height (in): 71

## 2010-08-14 NOTE — Progress Notes (Signed)
Summary: rx detrol la   Phone Note From Pharmacy   Caller: [facility] Summary of Call: fax report stating NDC not covered any more for vesicare and requesting to use detrol enablex oxybuturin oxytrol. Initial call taken by: Pura Spice, RN,  September 26, 2008 7:47 AM  Follow-up for Phone Call        per dr Alfonzo Feller may substitute to use detrol LA Follow-up by: Pura Spice, RN,  September 26, 2008 7:49 AM    New/Updated Medications: DETROL LA 4 MG XR24H-CAP (TOLTERODINE TARTRATE) 1 by mouth once daily   Prescriptions: DETROL LA 4 MG XR24H-CAP (TOLTERODINE TARTRATE) 1 by mouth once daily  #30 x 11   Entered by:   Pura Spice, RN   Authorized by:   Judithann Sheen MD   Signed by:   Pura Spice, RN on 09/26/2008   Method used:   Printed then faxed to ...       [facility] [address]* (retail)       [address]       [place], [place]  [address]       Ph: [phone]       Fax: [phone]   RxID:   [id number]

## 2010-08-14 NOTE — Progress Notes (Signed)
Summary: new rx needed  vesicare   Phone Note Call from Patient Call back at Home Phone [phone]   Caller: Patient-----walk in Summary of Call: Pt was on Vesicare 5mg  1qd. Would like a new rx called [facility] in [place]. Initial call taken by: Warnell Forester,  October 10, 2009 4:00 PM  Follow-up for Phone Call        OK PER DR STAFFORD. Follow-up by: Pura Spice, RN,  October 10, 2009 4:20 PM    New/Updated Medications: VESICARE 5 MG TABS (SOLIFENACIN SUCCINATE) 1 by mouth once daily Prescriptions: VESICARE 5 MG TABS (SOLIFENACIN SUCCINATE) 1 by mouth once daily  #90 x 3   Entered by:   Pura Spice, RN   Authorized by:   Judithann Sheen MD   Signed by:   Pura Spice, RN on 10/10/2009   Method used:   Electronically to        [facility] (retail)       [address]       [place], [place]  [address]       Ph: [phone]       Fax: [phone]   RxID:   [id number]

## 2010-08-14 NOTE — Consult Note (Signed)
Summary: [facility]  [facility]   Imported By: Maryln Gottron 05/06/2010 10:54:55  _____________________________________________________________________  External Attachment:    Type:   Image     Comment:   External Document

## 2010-08-14 NOTE — Assessment & Plan Note (Signed)
Summary: 2 month fup//ccm/pt rescd from bump//ccm   Vital Signs:  Patient profile:   77 year old male Height:      71 inches Weight:      230 pounds BMI:     32.19 Temp:     98.2 degrees F oral Pulse rate:   80 / minute Resp:     14 per minute BP sitting:   130 / 80  (left arm)  Vitals Entered By: Willy Eddy, LPN (June 24, 2010 1:33 PM) CC: this is a f/u from seeing dr blyth---had neck pain and was sent to dr Channing Mutters who did mri- to urgoloist for penis lesion Is Patient Diabetic? No   CC:  this is a f/u from seeing dr blyth---had neck pain and was sent to dr Channing Mutters who did mri- to urgoloist for penis lesion.  History of Present Illness: Patient presents to clinic for followup of multiple medical problems. Pt of Dr. Scotty Court.  Chart reviewed.  S/p urology procedure for [facility] of penis with reported 50month f/u for surveillance per pt.  Has concerns over healing since laser procedure. Underwent nuclear stress test interpreted as nl prior to procedure for clearance as well as evaluation of LBBB.  Currently being evaluated by neurosurgery with h/o chronic neck pain.  Reports undergoing cervical MRI with unknown results.     Preventive Screening-Counseling & Management  Alcohol-Tobacco     Smoking Status: never  Current Problems (verified): 1)  Abnormal Electrocardiogram  (ICD-794.31) 2)  Hypertension  (ICD-401.9) 3)  Hyperlipidemia  (ICD-272.4) 4)  Unspecified Pre-operative Examination  (ICD-V72.84) 5)  Penile Lesion  (ICD-236.6) 6)  Lbbb  (ICD-426.3) 7)  Candidiasis of Unspecified Site  (ICD-112.9) 8)  Gait Imbalance  (ICD-781.2) 9)  Spinal Stenosis, Cervical  (ICD-723.0) 10)  Constipation  (ICD-564.00) 11)  Low Back Pain, Chronic  (ICD-724.2) 12)  Oral Ulcer  (ICD-528.9) 13)  Neck Pain, Acute  (ICD-723.1) 14)  Urinary Frequency  (ICD-788.41) 15)  Conjunctivitis, Acute, Left  (ICD-372.00) 16)  Penile Lesion  (ICD-236.6) 17)  Edema  (ICD-782.3) 18)  Eczema   (ICD-692.9) 19)  Special Screening Malignant Neoplasm of Prostate  (ICD-V76.44) 20)  Vitamin D Deficiency  (ICD-268.9) 21)  Hypothyroidism  (ICD-244.9) 22)  Anemia  (ICD-285.9) 23)  Uns Advrs Eff Oth Rx Medicinal&biological Sbstnc  (ZOX-096.04) 24)  Contact Dermatitis  (ICD-692.9) 25)  Family Stress  (ICD-V61.9) 26)  Dermatitis, Atopic  (ICD-691.8) 27)  Acute Prostatitis  (ICD-601.0) 28)  Urinary Urgency  (ICD-788.63) 29)  Gerd  (ICD-530.81) 30)  Depression  (ICD-311) 31)  Allergic Rhinitis  (ICD-477.9)  Current Medications (verified): 1)  Lorcet 10/650 10-650 Mg Tabs (Hydrocodone-Acetaminophen) .Marland Kitchen.. 1 Tablet By Mouth Every Four To Six Hours 2)  Zoloft 100 Mg  Tabs (Sertraline Hcl) .... Two Times A Day 3)  Amitriptyline Hcl 100 Mg  Tabs (Amitriptyline Hcl) .... Take 1 At Bedtime 4)  Meprobamate 400 Mg  Tabs (Meprobamate) .Marland Kitchen.. 1 By Mouth Four Times A Day As Needed 5)  Baby Aspirin 81 Mg  Chew (Aspirin) .Marland Kitchen.. 1 Once Daily 6)  Triamterene-Hctz 75-50 Mg  Tabs (Triamterene-Hctz) .Marland Kitchen.. 1 By Mouth Once Daily 7)  Prilosec 20 Mg  Cpdr (Omeprazole) .... Otc 8)  Tobramycin Sulfate 0.3 % Soln (Tobramycin Sulfate) .Marland Kitchen.. 1 Qtt in Left Eye Tid 9)  Bactroban 2 % Oint (Mupirocin) .... Apply To Skin Below Left Eye Tid 10)  Vesicare 5 Mg Tabs (Solifenacin Succinate) .Marland Kitchen.. 1 By Mouth Once Daily 11)  Valtrex 500  Mg Tabs (Valacyclovir Hcl) .... 2 Tabs By Mouth Three Times A Day  For Herpes Infection X 7 Days and Then Decrease Back To 1 Bid 12)  First-Dukes Mouthwash  Susp (Diphenhyd-Hydrocort-Nystatin) .... 5 Cc By Mouth Qid As Needed Lesions X 14 Days 13)  Ketoconazole 2 % Crea (Ketoconazole) .... Apply Two Times A Day As Needed Rash 14)  Simvastatin 40 Mg Tabs (Simvastatin) .... Take 1/2 At Night 15)  Furosemide 20 Mg Tabs (Furosemide) .Marland Kitchen.. 1 Tab By Mouth Daily As Needed For Sob/edema/wt Gain > 3# in 24 Hr  Allergies (verified): 1)  Sulfamethoxazole (Sulfamethoxazole)  Review of Systems General:  Denies  chills and fever.  Physical Exam  General:  Well-developed,well-nourished,in no acute distress; alert,appropriate and cooperative throughout examination Head:  normocephalic.   Eyes:  pupils equal and pupils round.   Genitalia:  Examination of penis reveals mild distal shaft/head soft tissue swelling.  small suture line noted appears to be dissolvable suture material.  +small area of light tan discharge visible at wound. foreskin retractable   Impression & Recommendations:  Problem # 1:  CELLULITIS AND ABSCESS OF UNSPECIFIED SITE (ICD-682.9) Possible mild infxn at procedure site. Recommend oral course of abx and if no improvement f/u with urology for re-evaluation. Pt and family state understanding and agreement.   His updated medication list for this problem includes:    Keflex 500 Mg Caps (Cephalexin) ..... One by mouth bid  Complete Medication List: 1)  Lorcet 10/650 10-650 Mg Tabs (Hydrocodone-acetaminophen) .Marland Kitchen.. 1 tablet by mouth every four to six hours 2)  Zoloft 100 Mg Tabs (Sertraline hcl) .... Two times a day 3)  Amitriptyline Hcl 100 Mg Tabs (Amitriptyline hcl) .... Take 1 at bedtime 4)  Meprobamate 400 Mg Tabs (Meprobamate) .Marland Kitchen.. 1 by mouth four times a day as needed 5)  Baby Aspirin 81 Mg Chew (Aspirin) .Marland Kitchen.. 1 once daily 6)  Triamterene-hctz 75-50 Mg Tabs (Triamterene-hctz) .Marland Kitchen.. 1 by mouth once daily 7)  Prilosec 20 Mg Cpdr (Omeprazole) .... Otc 8)  Tobramycin Sulfate 0.3 % Soln (Tobramycin sulfate) .Marland Kitchen.. 1 qtt in left eye tid 9)  Bactroban 2 % Oint (Mupirocin) .... Apply to skin below left eye tid 10)  Vesicare 5 Mg Tabs (Solifenacin succinate) .Marland Kitchen.. 1 by mouth once daily 11)  Valtrex 500 Mg Tabs (Valacyclovir hcl) .... 2 tabs by mouth three times a day  for herpes infection x 7 days and then decrease back to 1 bid 12)  First-dukes Mouthwash Susp (Diphenhyd-hydrocort-nystatin) .... 5 cc by mouth qid as needed lesions x 14 days 13)  Ketoconazole 2 % Crea (Ketoconazole) ....  Apply two times a day as needed rash 14)  Simvastatin 40 Mg Tabs (Simvastatin) .... Take 1/2 at night 15)  Furosemide 20 Mg Tabs (Furosemide) .Marland Kitchen.. 1 tab by mouth daily as needed for sob/edema/wt gain > 3# in 24 hr 16)  Keflex 500 Mg Caps (Cephalexin) .... One by mouth bid  Patient Instructions: 1)  Please followup with Dr. Scotty Court or Dr. Rodena Medin in approximately 3 months Prescriptions: KEFLEX 500 MG CAPS (CEPHALEXIN) one by mouth bid  #14 x 0   Entered and Authorized by:   Edwyna Perfect MD   Signed by:   Edwyna Perfect MD on 06/24/2010   Method used:   Print then Give to Patient   RxID:   [id number]    Orders Added: 1)  Est. Patient Level IV [62[address]]

## 2010-08-14 NOTE — Assessment & Plan Note (Signed)
Summary: NECK PAIN (WORSE WITH MOVEMENT) // RS   Vital Signs:  Patient profile:   77 year old male Height:      71 inches (180.34 cm) Weight:      241 pounds (109.55 kg) BMI:     33.73 O2 Sat:      98 % on Room air Temp:     97.8 degrees F (36.56 degrees C) oral Pulse rate:   106 / minute BP sitting:   134 / 84  (left arm) Cuff size:   regular  Vitals Entered By: Josph Macho RMA (March 19, 2010 1:56 PM)  O2 Flow:  Room air CC: Neck pain X2 months/ CF Is Patient Diabetic? No   History of Present Illness: Patient in today with multiple complaints of. He has been struggling with left sided neck pain for about 2 months now. Initially he thought it might have been his pillow so he has changed it 3 times now but hte neck pain has not helped. He denies any trauma or falls. He also notes some recent increase in the oral ulcers he gets at times. He stopped acidic juices last week and has tried to gargle with hydrogen peroxide but this has not helped. He is also complaining of some persistent, mildly pruritic lesions on penile shaft 2 small lesions, under foreskin in a larger, red, scaly lesion roughly 3 mm in diameter and persistent despite topical treatment with Clotrimazole. Vesicare has helped his urinary frequency. No CP/palp/SOB.  Current Medications (verified): 1)  Lorcet 10/650 10-650 Mg Tabs (Hydrocodone-Acetaminophen) .Marland Kitchen.. 1 Tablet By Mouth Every Four To Six Hours 2)  Zoloft 100 Mg  Tabs (Sertraline Hcl) .... Two Times A Day 3)  Amitriptyline Hcl 100 Mg  Tabs (Amitriptyline Hcl) .... Take 1 At Bedtime 4)  Meprobamate 400 Mg  Tabs (Meprobamate) .Marland Kitchen.. 1 By Mouth Four Times A Day 5)  Baby Aspirin 81 Mg  Chew (Aspirin) .Marland Kitchen.. 1 Once Daily 6)  Triamterene-Hctz 75-50 Mg  Tabs (Triamterene-Hctz) .Marland Kitchen.. 1 By Mouth Once Daily 7)  Simvastatin 80 Mg  Tabs (Simvastatin) .Marland Kitchen.. 1 Hs For Cholesterol Elevation 8)  Prilosec 20 Mg  Cpdr (Omeprazole) .... Otc 9)  Tobramycin Sulfate 0.3 % Soln  (Tobramycin Sulfate) .Marland Kitchen.. 1 Qtt in Left Eye Tid 10)  Bactroban 2 % Oint (Mupirocin) .... Apply To Skin Below Left Eye Tid 11)  Vesicare 5 Mg Tabs (Solifenacin Succinate) .Marland Kitchen.. 1 By Mouth Once Daily 12)  Valtrex 500 Mg Tabs (Valacyclovir Hcl) .... 2 Tabs By Mouth Three Times A Day  For Herpes Infection X 7 Days and Then Decrease Back To 1 Bid 13)  First-Dukes Mouthwash  Susp (Diphenhyd-Hydrocort-Nystatin) .... 5 Cc By Mouth Qid As Needed Lesions X 14 Days 14)  Diflucan 100 Mg Tabs (Fluconazole) .Marland Kitchen.. 1 Tab By Mouth Q Wk X 2 Wk 15)  Nystatin 100000 Unit/gm Crea (Nystatin) .... Apply To Lesions Three Times A Day X 30 Days 16)  Baclofen 20 Mg Tabs (Baclofen) .Marland Kitchen.. 1 Tab Two Times A Day As Needed For Pain  Allergies (verified): 1)  Sulfamethoxazole (Sulfamethoxazole)  Past History:  Past medical history reviewed for relevance to current acute and chronic problems. Social history (including risk factors) reviewed for relevance to current acute and chronic problems.  Past Medical History: Reviewed history from 02/02/2007 and no changes required. Allergic rhinitis Depression GERD Hyperlipidemia Hypertension [facility]  Social History: Reviewed history from 02/02/2007 and no changes required. Retired Divorced Never Smoked Alcohol use-no Drug use-no  Regular exercise-no  Review of Systems      See HPI  Physical Exam  General:  Well-developed,well-nourished,in no acute distress; alert,appropriate and cooperative throughout examination Head:  Normocephalic and atraumatic without obvious abnormalities. No apparent alopecia or balding. Mouth:  dry mucus membranes, exaggerated bumps on top of tongue Neck:  muscle spasm over left sternocleidomastoid muscle.  FROM noted in neck. Lungs:  Normal respiratory effort, chest expands symmetrically. Lungs are clear to auscultation, no crackles or wheezes. Heart:  Normal rate and regular rhythm. S1 and S2 normal without gallop, murmur,  click, rub or other extra sounds. Abdomen:  Bowel sounds positive,abdomen soft and non-tender without masses, organomegaly or hernias noted. Extremities:  No clubbing, cyanosis, edema, or deformity noted  Skin:  dry scaly erythematous skin across face, 2 1-2 mm erythematous lesions on mid penile shaft and a 3-4 mm raised, erythematous lesion under foreskin, slight clear fluid oozing from site Cervical Nodes:  No lymphadenopathy noted Psych:  mildly anxiousOriented X3, memory intact for recent and remote, and normally interactive.     Impression & Recommendations:  Problem # 1:  NECK PAIN, ACUTE (ICD-723.1)  His updated medication list for this problem includes:    Lorcet 10/650 10-650 Mg Tabs (Hydrocodone-acetaminophen) .Marland Kitchen... 1 tablet by mouth every four to six hours    Baby Aspirin 81 Mg Chew (Aspirin) .Marland Kitchen... 1 once daily    Baclofen 20 Mg Tabs (Baclofen) .Marland Kitchen... 1 tab two times a day as needed for pain Add some Baclofen two times a day for the muscle spasm. Apply moist heat and stretch daily  Orders: T-Cervical Spine Comp 4 Views (72050TC) Apply moist heat with gentle stretching nightly  Problem # 2:  ORAL ULCER (ICD-528.9) Duke's Magic Mouthwash to use as needed and restart Valtrex at higher dose for one week and then reduce back to 500mg  by mouth bid  Problem # 3:  HYPERTENSION (ICD-401.9)  His updated medication list for this problem includes:    Triamterene-hctz 75-50 Mg Tabs (Triamterene-hctz) .Marland Kitchen... 1 by mouth once daily Well controlled today no change in meds  Problem # 4:  LOW BACK PAIN, CHRONIC (ICD-724.2)  His updated medication list for this problem includes:    Lorcet 10/650 10-650 Mg Tabs (Hydrocodone-acetaminophen) .Marland Kitchen... 1 tablet by mouth every four to six hours    Baby Aspirin 81 Mg Chew (Aspirin) .Marland Kitchen... 1 once daily    Baclofen 20 Mg Tabs (Baclofen) .Marland Kitchen... 1 tab two times a day as needed for pain Has been having trouble with his back for 5-6 years now since a ladder  broke and fell on him, this has not worsened recently  Problem # 5:  PENILE LESION (ICD-236.6) No response to the Lotrisone/Betamethasone despite using 2 tubes. If anything he thinks it is slightly worse no new lesions. Looks fungal in nature, Given Fluconazole and Nystatin and if no response will need referral to urology for possible circumcision  Problem # 6:  CONSTIPATION (ICD-564.00) Patient usually moves bowels well. Earlier this week he had a bout of constipation not moving his bowels for 3 days. He took a bottle of Mag Citrate and moved his bowels well, moved them again yesterday without difficulty. Add Benefiber 2 tsps by mouth daily with 8 oz of liquid.  and may use his stool softeners as needed still  Problem # 7:  URINARY FREQUENCY (ICD-788.41)  His updated medication list for this problem includes:    Vesicare 5 Mg Tabs (Solifenacin succinate) .Marland Kitchen... 1 by mouth once daily  Improved with Vesicare may continue the same  Complete Medication List: 1)  Lorcet 10/650 10-650 Mg Tabs (Hydrocodone-acetaminophen) .Marland Kitchen.. 1 tablet by mouth every four to six hours 2)  Zoloft 100 Mg Tabs (Sertraline hcl) .... Two times a day 3)  Amitriptyline Hcl 100 Mg Tabs (Amitriptyline hcl) .... Take 1 at bedtime 4)  Meprobamate 400 Mg Tabs (Meprobamate) .Marland Kitchen.. 1 by mouth four times a day 5)  Baby Aspirin 81 Mg Chew (Aspirin) .Marland Kitchen.. 1 once daily 6)  Triamterene-hctz 75-50 Mg Tabs (Triamterene-hctz) .Marland Kitchen.. 1 by mouth once daily 7)  Simvastatin 80 Mg Tabs (Simvastatin) .Marland Kitchen.. 1 hs for cholesterol elevation 8)  Prilosec 20 Mg Cpdr (Omeprazole) .... Otc 9)  Tobramycin Sulfate 0.3 % Soln (Tobramycin sulfate) .Marland Kitchen.. 1 qtt in left eye tid 10)  Bactroban 2 % Oint (Mupirocin) .... Apply to skin below left eye tid 11)  Vesicare 5 Mg Tabs (Solifenacin succinate) .Marland Kitchen.. 1 by mouth once daily 12)  Valtrex 500 Mg Tabs (Valacyclovir hcl) .... 2 tabs by mouth three times a day  for herpes infection x 7 days and then decrease back to 1  bid 13)  First-dukes Mouthwash Susp (Diphenhyd-hydrocort-nystatin) .... 5 cc by mouth qid as needed lesions x 14 days 14)  Diflucan 100 Mg Tabs (Fluconazole) .Marland Kitchen.. 1 tab by mouth q wk x 2 wk 15)  Nystatin 100000 Unit/gm Crea (Nystatin) .... Apply to lesions three times a day x 30 days 16)  Baclofen 20 Mg Tabs (Baclofen) .Marland Kitchen.. 1 tab two times a day as needed for pain  Patient Instructions: 1)  Please schedule a follow-up appointment in 1 month.  2)  Apply moist heat to neck two times a day and then gentle stretching.  3)  For bowels add Benefiber 2 tsp by mouth in liquid daily and consider a yogurt daily, may use Milk of Magnesia and stool softeners as needed Prescriptions: BACLOFEN 20 MG TABS (BACLOFEN) 1 tab two times a day as needed for pain  #60 x 1   Entered by:   Josph Macho RMA   Authorized by:   Danise Edge MD   Signed by:   Josph Macho RMA on 03/19/2010   Method used:   Faxed to ...       [facility] [address] (retail)       [address]       [place], [place]  [address]       Ph: [phone]       Fax: [phone]   RxID:   [id number] LORCET 10/650 10-650 MG TABS (HYDROCODONE-ACETAMINOPHEN) 1 tablet by mouth every four to six hours  #100 x 1   Entered and Authorized by:   Danise Edge MD   Signed by:   Danise Edge MD on 03/19/2010   Method used:   Print then Give to Patient   RxID:   [id number] LORCET 10/650 10-650 MG TABS (HYDROCODONE-ACETAMINOPHEN) 1 tablet by mouth every four to six hours  #100 x 1   Entered and Authorized by:   Danise Edge MD   Signed by:   Danise Edge MD on 03/19/2010   Method used:   Print then Give to Patient   RxID:   [id number] NYSTATIN 100000 UNIT/GM CREA (NYSTATIN) apply to lesions three times a day x 30 days  #60 gm tube x 1   Entered and Authorized by:   Danise Edge MD   Signed by:   Danise Edge  MD on 03/19/2010   Method used:   Electronically to        [facility].*  (retail)       [address]       [place], [place]  [address]       Ph: [phone]       Fax: [phone]   RxID:   [id number] DIFLUCAN 100 MG TABS (FLUCONAZOLE) 1 tab by mouth q wk x 2 wk  #2 x 0   Entered and Authorized by:   Danise Edge MD   Signed by:   Danise Edge MD on 03/19/2010   Method used:   Electronically to        [facility].* (retail)       [address]       [place], [place]  [address]       Ph: [phone]       Fax: [phone]   RxID:   [id number] FIRST-DUKES MOUTHWASH  SUSP (DIPHENHYD-HYDROCORT-NYSTATIN) 5 cc by mouth qid as needed lesions x 14 days  #1 bottle x 1   Entered and Authorized by:   Danise Edge MD   Signed by:   Danise Edge MD on 03/19/2010   Method used:   Electronically to        [facility].* (retail)       [address]       [place], [place]  [address]       Ph: [phone]       Fax: [phone]   RxID:   [id number] VALTREX 500 MG TABS (VALACYCLOVIR HCL) 2 tabs by mouth three times a day  for herpes infection x 7 days and then decrease back to 1 bid  #90 x 1   Entered and Authorized by:   Danise Edge MD   Signed by:   Danise Edge MD on 03/19/2010   Method used:   Electronically to        [facility].* (retail)       [address]       [place], [place]  [address]       Ph: [phone]       Fax: [phone]   RxID:   [id number]

## 2010-08-14 NOTE — Letter (Signed)
Summary: Clearance Letter  [facility], Main Office  [address]   [place], [place] [address]   Phone: [phone]  Fax: [phone]    May 29, 2010  Re:     [facility] Address:   [address]     [place], [place]  [address] DOB:     1934/01/27 MRN:     [id number]   Dear Dr Patsi Sears,       Mr. Jue is low risk cardiac wise for surgery. Please call with any questions or concerns.    Sincerely,  Deliah Goody, RN/Dr Brain Jens Som

## 2010-08-14 NOTE — Assessment & Plan Note (Signed)
Summary: emp-will fast/ccm   Vital Signs:  Patient profile:   77 year old male Weight:      240 pounds O2 Sat:      95 % Temp:     98.3 degrees F Pulse rate:   96 / minute Pulse rhythm:   regular BP sitting:   140 / 80  (left arm) Cuff size:   large  Vitals Entered By: Pura Spice, RN (September 05, 2009 8:47 AM) CC: go over problems fasting  refill meds   Is Patient Diabetic? No   History of Present Illness: This 77 year old white male hypertensive patient is in today to review his medical problems and refill as necessary medication. Recently he had fallen and caused some low back pain for low-grade severity but best served or hydrocodone to relieve his pain x-ray was not necessary. He had conjunctivitis 7-10 days ago and it is completely cleared after treatment The patient is very much concerned about the possibility of his having diabetes since he has a younger brother with diabetes as well as one other member of his family, all problems and will recheck Blood pressure is under good control Immunizations up to date Has had urinary urgency viral he went that for all but better with vesicare Indigestion is controlled with Prilosec on a p.r.n. basis We'll refill medication Patient has had chronic mild anxiety or stress which has been relieved with meprobamate as needed  Allergies: 1)  Sulfamethoxazole (Sulfamethoxazole)  Past History:  Past Medical History: Last updated: 02/02/2007 Allergic rhinitis Depression GERD Hyperlipidemia Hypertension [facility]  Social History: Last updated: 02/02/2007 Retired Divorced Never Smoked Alcohol use-no Drug use-no Regular exercise-no  Risk Factors: Smoking Status: never (02/02/2007)  Past Surgical History: Rt wrist tumor hrnioraphy early 70s  Review of Systems  The patient denies anorexia, fever, weight loss, weight gain, vision loss, decreased hearing, hoarseness, chest pain, syncope, dyspnea on exertion,  peripheral edema, prolonged cough, headaches, hemoptysis, abdominal pain, melena, hematochezia, severe indigestion/heartburn, hematuria, incontinence, genital sores, muscle weakness, suspicious skin lesions, transient blindness, difficulty walking, depression, unusual weight change, abnormal bleeding, enlarged lymph nodes, angioedema, breast masses, and testicular masses.    Physical Exam  General:  Well-developed,well-nourished,in no acute distress; alert,appropriate and cooperative throughout examination Head:  Normocephalic and atraumatic without obvious abnormalities. No apparent alopecia or balding. Eyes:  No corneal or conjunctival inflammation noted. EOMI. Perrla. Funduscopic exam benign, without hemorrhages, exudates or papilledema. Vision grossly normal. Ears:  External ear exam shows no significant lesions or deformities.  Otoscopic examination reveals clear canals, tympanic membranes are intact bilaterally without bulging, retraction, inflammation or discharge. Hearing is grossly normal bilaterally. Nose:  External nasal examination shows no deformity or inflammation. Nasal mucosa are pink and moist without lesions or exudates. Mouth:  Oral mucosa and oropharynx without lesions or exudates.  Teeth in good repair. Neck:  No deformities, masses, or tenderness noted. Chest Wall:  No deformities, masses, tenderness or gynecomastia noted. Breasts:  No masses or gynecomastia noted Lungs:  Normal respiratory effort, chest expands symmetrically. Lungs are clear to auscultation, no crackles or wheezes. Heart:  Normal rate and regular rhythm. S1 and S2 normal without gallop, murmur, click, rub or other extra sounds. Abdomen:  Bowel sounds positive,abdomen soft and non-tender without masses, organomegaly or hernias noted. Rectal:  No external abnormalities noted. Normal sphincter tone. No rectal masses or tenderness. Genitalia:  Testes bilaterally descended without nodularity, tenderness or masses.  No scrotal masses or lesions. No penis lesions or urethral  discharge. Prostate:  1+ enlarged.   Msk:  No deformity or scoliosis noted of thoracic or lumbar spine.   Pulses:  R and L carotid,radial,femoral,dorsalis pedis and posterior tibial pulses are full and equal bilaterally Extremities:  trace pretibial edema Neurologic:  No cranial nerve deficits noted. Station and gait are normal. Plantar reflexes are down-going bilaterally. DTRs are symmetrical throughout. Sensory, motor and coordinative functions appear intact. Skin:  Intact without suspicious lesions or rashes Cervical Nodes:  No lymphadenopathy noted Axillary Nodes:  No palpable lymphadenopathy Inguinal Nodes:  No significant adenopathy Psych:  Cognition and judgment appear intact. Alert and cooperative with normal attention span and concentration. No apparent delusions, illusions, hallucinations   Impression & Recommendations:  Problem # 1:  URINARY FREQUENCY (ICD-788.41) Assessment Deteriorated  The following medications were removed from the medication list:    Detrol La 4 Mg Xr24h-cap (Tolterodine tartrate) .Marland Kitchen... 1 by mouth once daily His updated medication list for this problem includes:    Vesicare 5 Mg Tabs (Solifenacin succinate)  Orders: UA Dipstick w/o Micro (automated)  (81003)  Problem # 2:  CONJUNCTIVITIS, ACUTE, LEFT (ICD-372.00) Assessment: Improved  His updated medication list for this problem includes:    Tobramycin Sulfate 0.3 % Soln (Tobramycin sulfate) .Marland Kitchen... 1 qtt in left eye tid  Problem # 3:  EDEMA (ICD-782.3) Assessment: Improved  His updated medication list for this problem includes:    Triamterene-hctz 75-50 Mg Tabs (Triamterene-hctz) .Marland Kitchen... 1 by mouth once daily  Orders: Prescription Created Electronically [phone])  Problem # 4:  PENILE LESION (ICD-236.6) Assessment: Improved  Problem # 5:  FAMILY STRESS (ICD-V61.9) Assessment: Unchanged  Problem # 6:  URINARY URGENCY  (JWJ-191.47) Assessment: Unchanged  Problem # 7:  HYPERTENSION (ICD-401.9) Assessment: Improved  His updated medication list for this problem includes:    Triamterene-hctz 75-50 Mg Tabs (Triamterene-hctz) .Marland Kitchen... 1 by mouth once daily  Orders: EKG w/ Interpretation (93000)  Problem # 8:  HYPERLIPIDEMIA (ICD-272.4) Assessment: Improved  His updated medication list for this problem includes:    Simvastatin 80 Mg Tabs (Simvastatin) .Marland Kitchen... 1 hs for cholesterol elevation  Orders: TLB-Lipid Panel (80061-LIPID) TLB-Hepatic/Liver Function Pnl (80076-HEPATIC)  Problem # 9:  GERD (ICD-530.81) Assessment: Improved  His updated medication list for this problem includes:    Prilosec 20 Mg Cpdr (Omeprazole) ..... Otc  Complete Medication List: 1)  Lorcet 10/650 10-650 Mg Tabs (Hydrocodone-acetaminophen) .Marland Kitchen.. 1 tablet by mouth every four to six hours 2)  Zoloft 100 Mg Tabs (Sertraline hcl) .... Two times a day 3)  Amitriptyline Hcl 100 Mg Tabs (Amitriptyline hcl) .... Take 1 at bedtime 4)  Meprobamate 400 Mg Tabs (Meprobamate) .Marland Kitchen.. 1 by mouth four times a day 5)  Baby Aspirin 81 Mg Chew (Aspirin) .Marland Kitchen.. 1 once daily 6)  Triamterene-hctz 75-50 Mg Tabs (Triamterene-hctz) .Marland Kitchen.. 1 by mouth once daily 7)  Simvastatin 80 Mg Tabs (Simvastatin) .Marland Kitchen.. 1 hs for cholesterol elevation 8)  Prilosec 20 Mg Cpdr (Omeprazole) .... Otc 9)  Lotrisone 1-0.05 % Crea (Clotrimazole-betamethasone) .... Apply to lesion two times a day for 14 days 10)  Vitamin D 82956 Unit Caps (Ergocalciferol) .Marland Kitchen.. 1 by mouth weekly. 11)  Tobramycin Sulfate 0.3 % Soln (Tobramycin sulfate) .Marland Kitchen.. 1 qtt in left eye tid 12)  Bactroban 2 % Oint (Mupirocin) .... Apply to skin below left eye tid 13)  Vesicare 5 Mg Tabs (Solifenacin succinate)  Other Orders: Venipuncture (21308) T-Vitamin D (25-Hydroxy) (65784-69629) TLB-BMP (Basic Metabolic Panel-BMET) (80048-METABOL) TLB-CBC Platelet - w/Differential (85025-CBCD) TLB-TSH (Thyroid  Stimulating Hormone) (84443-TSH) TLB-PSA (Prostate Specific Antigen) (84153-PSA)  Patient Instructions: 1)  doing very well., continue to keep calories down 2)  will call results of labs 3)  refilled medicationa 4)  Eye is well Prescriptions: SIMVASTATIN 80 MG  TABS (SIMVASTATIN) 1 hs for cholesterol elevation  #30 x 11   Entered and Authorized by:   Judithann Sheen MD   Signed by:   Judithann Sheen MD on 09/05/2009   Method used:   Electronically to        [facility].* (retail)       [address]       [place], [place]  [address]       Ph: [phone]       Fax: [phone]   RxID:   [id number] TRIAMTERENE-HCTZ 75-50 MG  TABS (TRIAMTERENE-HCTZ) 1 by mouth once daily  #180 x 3   Entered and Authorized by:   Judithann Sheen MD   Signed by:   Judithann Sheen MD on 09/05/2009   Method used:   Electronically to        [facility].* (retail)       [address]       [place], [place]  [address]       Ph: [phone]       Fax: [phone]   RxID:   [id number] MEPROBAMATE 400 MG  TABS (MEPROBAMATE) 1 by mouth four times a day  #100 x 11   Entered and Authorized by:   Judithann Sheen MD   Signed by:   Judithann Sheen MD on 09/05/2009   Method used:   Print then Give to Patient   RxID:   [id number] AMITRIPTYLINE HCL 100 MG  TABS (AMITRIPTYLINE HCL) take 1 at bedtime  #30 x 11   Entered and Authorized by:   Judithann Sheen MD   Signed by:   Judithann Sheen MD on 09/05/2009   Method used:   Electronically to        [facility].* (retail)       [address]       [place], [place]  [address]       Ph: [phone]       Fax: [phone]   RxID:   [id number] ZOLOFT 100 MG  TABS (SERTRALINE HCL) two times a day  #30 x 11   Entered and Authorized by:   Judithann Sheen MD   Signed by:   Judithann Sheen MD on 09/05/2009   Method used:   Electronically to        [facility].* (retail)       [address]       [place], [place]  [address]       Ph: [phone]       Fax: [phone]   RxID:   [id number] LORCET 10/650 10-650 MG TABS (HYDROCODONE-ACETAMINOPHEN) 1 tablet by mouth every four to six hours  #100 x 5   Entered and Authorized by:   Judithann Sheen MD   Signed by:   Judithann Sheen MD on 09/05/2009   Method used:   Print  then Give to Patient   RxID:   [id number]  [facility]  called   quesstioning quanity on meprobamate and zoloft .per dr Alfonzo Feller zoloft  #60 and meprobamate 120 .gh rn......Marland Kitchen  Laboratory Results   Urine Tests    Routine Urinalysis   Color: yellow Appearance: Clear Glucose: negative   (Normal Range: Negative) Bilirubin: negative   (Normal Range: Negative) Ketone: negative   (Normal Range: Negative) Spec. Gravity: 1.015   (Normal Range: 1.003-1.035) Blood: negative   (Normal Range: Negative) pH: 6.5   (Normal Range: 5.0-8.0) Protein: 1+   (Normal Range: Negative) Urobilinogen: 0.2   (Normal Range: 0-1) Nitrite: negative   (Normal Range: Negative) Leukocyte Esterace: trace   (Normal Range: Negative)    Comments: Rita Ohara  September 05, 2009 11:06 AM

## 2010-08-14 NOTE — Assessment & Plan Note (Signed)
Summary: Cardiology Nuclear Testing  Nuclear Med Background Indications for Stress Test: Evaluation for Ischemia, Surgical Clearance, Abnormal EKG  Indications Comments: Pending urological procedure  History: Myocardial Perfusion Study  History Comments: 06 MPS normal  Symptoms: DOE, Fatigue    Nuclear Pre-Procedure Cardiac Risk Factors: Hypertension, LBBB, Lipids Caffeine/Decaff Intake: none NPO After: 10:00 PM Lungs: clear IV 0.9% NS with Angio Cath: 22g     IV Site: R Hand IV Started by: Cathlyn Parsons, RN Chest Size (in) 44     Height (in): 71 Weight (lb): 230 BMI: 32.19  Nuclear Med Study 1 or 2 day study:  1 day     Stress Test Type:  Adenosine Reading MD:  Charlton Haws, MD     Referring MD:  B.Crenshaw Resting Radionuclide:  Technetium 57m Tetrofosmin     Resting Radionuclide Dose:  11 mCi  Stress Radionuclide:  Technetium 70m Tetrofosmin     Stress Radionuclide Dose:  33 mCi   Stress Protocol  Max Systolic BP: 144 mm HgDose of Adenosine:  58.5 mg    Stress Test Technologist:  Milana Na, EMT-P     Nuclear Technologist:  Doyne Keel, CNMT  Rest Procedure  Myocardial perfusion imaging was performed at rest 45 minutes following the intravenous administration of Technetium 56m Tetrofosmin.  Stress Procedure  The patient received IV adenosine at 140 mcg/kg/min for 4 minutes. 2nd degree avb with infusion. There were no significant changes and rare pvcs with infusion. Technetium 44m Tetrofosmin was injected at the 2 minute mark and quantitative spect images were obtained after a 45 minute delay.  QPS Raw Data Images:  Normal; no motion artifact; normal heart/lung ratio. Stress Images:  Normal homogeneous uptake in all areas of the myocardium. Rest Images:  Normal homogeneous uptake in all areas of the myocardium. Subtraction (SDS):  Normal Transient Ischemic Dilatation:  .93  (Normal <1.22)  Lung/Heart Ratio:  .42  (Normal <0.45)  Quantitative Gated  Spect Images QGS EDV:  87 ml QGS ESV:  35 ml QGS EF:  60 % QGS cine images:  normal  Findings Normal nuclear study      Overall Impression  Exercise Capacity: Adenosine study with no exercise. BP Response: Normal blood pressure response. Clinical Symptoms: Warm and Flushed ECG Impression: LBBB Overall Impression: Normal stress nuclear study.  Appended Document: Cardiology Nuclear Testing ok  Appended Document: Cardiology Nuclear Testing pt aware of results

## 2010-08-14 NOTE — Letter (Signed)
Summary: [facility]  [facility]   Imported By: Marylou Mccoy 06/30/2010 13:18:46  _____________________________________________________________________  External Attachment:    Type:   Image     Comment:   External Document

## 2010-08-14 NOTE — Progress Notes (Signed)
Summary: FYI   Phone Note From Pharmacy   Caller: cory,[facility], [phone] Call For: stafford  Summary of Call: Calling about Meprobamate.  Will treat this as new Rx because he's not had on his profile here. (#100/11rf).   Initial call taken by: Rudy Jew, RN,  January 24, 2008 12:19 PM  Follow-up for Phone Call        notified cory and vertified pt on meprobroamte Follow-up by: Pura Spice, RN,  January 24, 2008 12:45 PM

## 2010-08-14 NOTE — Assessment & Plan Note (Signed)
Summary: 1 MTH ROV // RS   Vital Signs:  Patient profile:   77 year old male Height:      71 inches (180.34 cm) Weight:      240 pounds (109.09 kg) O2 Sat:      96 % on Room air Temp:     98.1 degrees F (36.72 degrees C) oral Pulse rate:   102 / minute BP sitting:   152 / 82  (left arm) Cuff size:   regular  Vitals Entered By: Josph Macho RMA (April 23, 2010 2:16 PM)  O2 Flow:  Room air CC: 1 month follow up/ CF Is Patient Diabetic? No   History of Present Illness: patient is a 77 year old Caucasian male in today for reevaluation of multiple concerns. He continues to have chronic low back pain some intermittent radiculopathy and some episodes of recurrent falls. She is agreeing to neurosurgical evaluation today. Recent x-ray did show some spinal stenosis potentially symptomatic. No worsening of pain or symptoms since his last visit no bowel or bladder incontinence. No chest pain, palpitations, shortness of breath, GI concerns. He has been using his nystatin regularly on his candidal penile lesions but the symptoms persist and are not improved. No significant pain is associated with these lesions. He denies penile discharge, dysuria or hematuria  Current Medications (verified): 1)  Lorcet 10/650 10-650 Mg Tabs (Hydrocodone-Acetaminophen) .Marland Kitchen.. 1 Tablet By Mouth Every Four To Six Hours 2)  Zoloft 100 Mg  Tabs (Sertraline Hcl) .... Two Times A Day 3)  Amitriptyline Hcl 100 Mg  Tabs (Amitriptyline Hcl) .... Take 1 At Bedtime 4)  Meprobamate 400 Mg  Tabs (Meprobamate) .Marland Kitchen.. 1 By Mouth Four Times A Day 5)  Baby Aspirin 81 Mg  Chew (Aspirin) .Marland Kitchen.. 1 Once Daily 6)  Triamterene-Hctz 75-50 Mg  Tabs (Triamterene-Hctz) .Marland Kitchen.. 1 By Mouth Once Daily 7)  Simvastatin 80 Mg  Tabs (Simvastatin) .Marland Kitchen.. 1 Hs For Cholesterol Elevation 8)  Prilosec 20 Mg  Cpdr (Omeprazole) .... Otc 9)  Tobramycin Sulfate 0.3 % Soln (Tobramycin Sulfate) .Marland Kitchen.. 1 Qtt in Left Eye Tid 10)  Bactroban 2 % Oint (Mupirocin) ....  Apply To Skin Below Left Eye Tid 11)  Vesicare 5 Mg Tabs (Solifenacin Succinate) .Marland Kitchen.. 1 By Mouth Once Daily 12)  Valtrex 500 Mg Tabs (Valacyclovir Hcl) .... 2 Tabs By Mouth Three Times A Day  For Herpes Infection X 7 Days and Then Decrease Back To 1 Bid 13)  First-Dukes Mouthwash  Susp (Diphenhyd-Hydrocort-Nystatin) .... 5 Cc By Mouth Qid As Needed Lesions X 14 Days 14)  Diflucan 100 Mg Tabs (Fluconazole) .Marland Kitchen.. 1 Tab By Mouth Q Wk X 2 Wk 15)  Nystatin 100000 Unit/gm Crea (Nystatin) .... Apply To Lesions Three Times A Day X 30 Days 16)  Baclofen 20 Mg Tabs (Baclofen) .Marland Kitchen.. 1 Tab Two Times A Day As Needed For Pain  Allergies (verified): 1)  Sulfamethoxazole (Sulfamethoxazole)  Past History:  Past medical history reviewed for relevance to current acute and chronic problems. Social history (including risk factors) reviewed for relevance to current acute and chronic problems.  Past Medical History: Reviewed history from 02/02/2007 and no changes required. Allergic rhinitis Depression GERD Hyperlipidemia Hypertension HOH-Bil[facility]  Social History: Reviewed history from 02/02/2007 and no changes required. Retired Divorced Never Smoked Alcohol use-no Drug use-no Regular exercise-no  Review of Systems      See HPI  Physical Exam  General:  Well-developed,well-nourished,in no acute distress; alert,appropriate and cooperative throughout examination. Obese Head:  Normocephalic  and atraumatic without obvious abnormalities. No apparent alopecia or balding. Mouth:  Oral mucosa and oropharynx without lesions or exudates.  Teeth in good repair. Neck:  No deformities, masses, or tenderness noted. Lungs:  Normal respiratory effort, chest expands symmetrically. Lungs are clear to auscultation, no crackles or wheezes. Heart:  Normal rate and regular rhythm. S1 and S2 normal without gallop, click, rub or other extra sounds.extrasystoles noted and Grade  1 /6 systolic ejection murmur.     Abdomen:  Bowel sounds positive,abdomen soft and non-tender without masses, organomegaly or hernias noted. Genitalia:  foreskin is swollen and erythematous, penile shaft has a raised, scaly erythematous lesion at the dustak asoect, Extremities:  No clubbing, cyanosis, edema, or deformity noted with normal full range of motion of all joints.   Psych:  Cognition and judgment appear intact. Alert and cooperative with normal attention span and concentration. No apparent delusions, illusions, hallucinations   Impression & Recommendations:  Problem # 1:  GAIT IMBALANCE (ICD-781.2) Continues to struggle with falls intermittently recurrent weakness. Is going to see neurosurgery for further evaluation  Problem # 2:  SPINAL STENOSIS, CERVICAL (ICD-723.0) Continues to struggle with withlow back pain and radiculopathy. No change in meds today will await neurosurgery opinion.  Problem # 3:  PENILE LESION (ICD-236.6)  Orders: Urology Referral (Urology). Persistent candidal penile infection in an uncircumcised male, he will need to consider circumcision if lesions do not resolve.  Complete Medication List: 1)  Lorcet 10/650 10-650 Mg Tabs (Hydrocodone-acetaminophen) .Marland Kitchen.. 1 tablet by mouth every four to six hours 2)  Zoloft 100 Mg Tabs (Sertraline hcl) .... Two times a day 3)  Amitriptyline Hcl 100 Mg Tabs (Amitriptyline hcl) .... Take 1 at bedtime 4)  Meprobamate 400 Mg Tabs (Meprobamate) .Marland Kitchen.. 1 by mouth four times a day 5)  Baby Aspirin 81 Mg Chew (Aspirin) .Marland Kitchen.. 1 once daily 6)  Triamterene-hctz 75-50 Mg Tabs (Triamterene-hctz) .Marland Kitchen.. 1 by mouth once daily 7)  Simvastatin 80 Mg Tabs (Simvastatin) .Marland Kitchen.. 1 hs for cholesterol elevation 8)  Prilosec 20 Mg Cpdr (Omeprazole) .... Otc 9)  Tobramycin Sulfate 0.3 % Soln (Tobramycin sulfate) .Marland Kitchen.. 1 qtt in left eye tid 10)  Bactroban 2 % Oint (Mupirocin) .... Apply to skin below left eye tid 11)  Vesicare 5 Mg Tabs (Solifenacin succinate) .Marland Kitchen.. 1 by mouth once  daily 12)  Valtrex 500 Mg Tabs (Valacyclovir hcl) .... 2 tabs by mouth three times a day  for herpes infection x 7 days and then decrease back to 1 bid 13)  First-dukes Mouthwash Susp (Diphenhyd-hydrocort-nystatin) .... 5 cc by mouth qid as needed lesions x 14 days 14)  Diflucan 100 Mg Tabs (Fluconazole) .Marland Kitchen.. 1 tab by mouth q wk x 2 wk 15)  Baclofen 20 Mg Tabs (Baclofen) .Marland Kitchen.. 1 tab two times a day as needed for pain 16)  Ketoconazole 2 % Crea (Ketoconazole) .... Apply two times a day as needed rash  Patient Instructions: 1)  Please schedule a follow-up appointment in 2 months 2)  Limit your Sodium(salt) .  3)  Call if you are ready to start some physical therapy or see a neurologist 4)  Report worsening symptoms Prescriptions: KETOCONAZOLE 2 % CREA (KETOCONAZOLE) apply two times a day as needed rash  #60gm tube x 1   Entered and Authorized by:   Danise Edge MD   Signed by:   Danise Edge MD on 04/23/2010   Method used:   Electronically to        [facility] [address]* (retail)       [address]       [place], [place]  [address]       Ph: [phone]       Fax: [phone]   RxID:   [id number]   Preventive Care Screening  Last Flu Shot:    Date:  04/07/2010    Results:  historic

## 2010-08-14 NOTE — Medication Information (Signed)
Summary: PA & Approval for meprobamate  PA & Approval for meprobamate   Imported By: Maryln Gottron 07/21/2010 12:23:42  _____________________________________________________________________  External Attachment:    Type:   Image     Comment:   External Document

## 2010-08-14 NOTE — Assessment & Plan Note (Signed)
Summary: 2 WKS OV/NTA   Vital Signs:  Patient Profile:   77 Years Old Male Weight:      246 pounds Temp:     97.6 degrees F BP sitting:   136 / 80  (left arm) Cuff size:   large  Vitals Entered By: Pura Spice, RN (February 02, 2008 1:15 PM)                 Chief Complaint:  2 wk follow up .  History of Present Illness: Pt doing much better, repeat BP 138/88 Urinating easier and not as frequent, back pain subsided Need to increase enablex 15 mg. Having epigastric discomfort relieved with tums continues to be under stress    Current Allergies (reviewed today): SULFAMETHOXAZOLE (SULFAMETHOXAZOLE)     Review of Systems      See HPI  GU      Complains of urinary frequency.   Physical Exam  General:     Well-developed,well-nourished,in no acute distress; alert,appropriate and cooperative throughout examination Lungs:     Normal respiratory effort, chest expands symmetrically. Lungs are clear to auscultation, no crackles or wheezes. Heart:     Normal rate and regular rhythm. S1 and S2 normal without gallop, murmur, click, rub or other extra sounds. Abdomen:     epigastric tenderness Rectal:     No external abnormalities noted. Normal sphincter tone. No rectal masses or tenderness. Genitalia:     Testes bilaterally descended without nodularity, tenderness or masses. No scrotal masses or lesions. No penis lesions or urethral discharge. Prostate:     almost normal size , no tenderness Skin:     erythematous rash over lower extremities, appears as contac dermatitis    Impression & Recommendations:  Problem # 1:  ACUTE PROSTATITIS (ICD-601.0) Assessment: Improved  Problem # 2:  GERD (ICD-530.81) Assessment: Deteriorated  His updated medication list for this problem includes:    Prilosec 20 Mg Cpdr (Omeprazole) .Marland Kitchen... 2 per day for 2 days then 1 per day   Problem # 3:  URINARY URGENCY (ZOX-096.04) Assessment: Improved  Problem # 4:  HYPERTENSION  (ICD-401.9) Assessment: Unchanged  His updated medication list for this problem includes:    Triamterene-hctz 75-50 Mg Tabs (Triamterene-hctz) .Marland Kitchen... 1 by mouth once daily   Complete Medication List: 1)  Lorcet 10/650 10-650 Mg Tabs (Hydrocodone-acetaminophen) .Marland Kitchen.. 1 tablet by mouth every four to six hours 2)  Zoloft 100 Mg Tabs (Sertraline hcl) .... Two times a day 3)  Amitriptyline Hcl 100 Mg Tabs (Amitriptyline hcl) .... Take 1 at bedtime 4)  Meprobamate 400 Mg Tabs (Meprobamate) .Marland Kitchen.. 1 by mouth four times a day 5)  Baby Aspirin 81 Mg Chew (Aspirin) .Marland Kitchen.. 1 once daily 6)  Triamterene-hctz 75-50 Mg Tabs (Triamterene-hctz) .Marland Kitchen.. 1 by mouth once daily 7)  Doxycycline Hyclate 100 Mg Caps (Doxycycline hyclate) .Marland Kitchen.. 1 two times a day for prostatitis 8)  Simvastatin 80 Mg Tabs (Simvastatin) .Marland Kitchen.. 1 hs for cholesterol elevation 9)  Enablex 15 Mg Xr24h-tab (Darifenacin hydrobromide) .Marland Kitchen.. 1 per day 10)  Prilosec 20 Mg Cpdr (Omeprazole) .... 2 per day for 2 days then 1 per day 11)  Levaquin 500 Mg Tabs (Levofloxacin) .Marland Kitchen.. 1 qd  Other Orders: Depo- Medrol 80mg  (J1040) Admin of Therapeutic Inj  intramuscular or subcutaneous (54098)   Patient Instructions: 1)  Prostatiitis is much improved., finish Doxycycline3 thn take sple of Levaquin 500 mg for 5 days, 1 per day 2)  For urgency take enablex 15 mg once per  day 3)  Prilosec 2 per day for 2 days then 1 per day to stop ulcer pain 4)  Depomedrol 120 mg shot will help rash   Prescriptions: LEVAQUIN 500 MG  TABS (LEVOFLOXACIN) 1 qd  #5 x 0   Entered and Authorized by:   Judithann Sheen MD   Signed by:   Pura Spice, RN on 02/02/2008   Method used:   Samples Given   RxID:   Ella.Ego  ]  Medication Administration  Injection # 1:    Medication: Depo- Medrol 80mg     Diagnosis: DERMATITIS, ATOPIC (ICD-691.8)    Route: IM    Site: RUOQ gluteus    Exp Date: 02/2009    Lot #: [id number]    Mfr: sircor    Patient tolerated  injection without complications    Given by: Pura Spice, RN (February 02, 2008 2:41 PM)  Orders Added: 1)  Depo- Medrol 80mg  [J1040] 2)  Admin of Therapeutic Inj  intramuscular or subcutaneous [96372] 3)  Est. Patient Level III [13244]    Medication Administration  Injection # 1:    Medication: Depo- Medrol 80mg     Diagnosis: DERMATITIS, ATOPIC (ICD-691.8)    Route: IM    Site: RUOQ gluteus    Exp Date: 02/2009    Lot #: [id number]    Mfr: sircor    Patient tolerated injection without complications    Given by: Pura Spice, RN (February 02, 2008 2:41 PM)  Orders Added: 1)  Depo- Medrol 80mg  [J1040] 2)  Admin of Therapeutic Inj  intramuscular or subcutaneous [96372] 3)  Est. Patient Level III [66440]

## 2010-08-14 NOTE — Miscellaneous (Signed)
Summary: pi for cpx   ---- Converted from flag ---- ---- 09/05/2009 10:47 AM, Rita Ohara wrote: Patient could not leave urine. ------------------------------

## 2010-08-14 NOTE — Assessment & Plan Note (Signed)
Summary: follow up/3:15 per Gina/mhf   Vital Signs:  Patient Profile:   77 Years Old Male Weight:      251.6 pounds Pulse rate:   70 / minute Resp:     16 per minute BP sitting:   130 / 80  (left arm) Cuff size:   regular  Vitals Entered By: Pura Spice, RN (January 19, 2008 3:37 PM)                 Chief Complaint:  refill meprobamate  .  History of Present Illness: Pt returns for refill on meprobamate which he only takes ocasionally , 90 since last Oct Use occasional Lorcet but not ov=er 1 per day continues to have back pain and knee pain continues to be overweight and does not lose chronic dermatologial problem ,  under care of dermatologist who removes lesions ocasionally , no cancer Has had urinary urgency in past 2-3 monts, incintinent at times, no dysuria, to check urinalysis and prostate exam no other complaints    Current Allergies (reviewed today): SULFAMETHOXAZOLE (SULFAMETHOXAZOLE)     Review of Systems      See HPI  GU      Complains of incontinence and urinary frequency.      urgency  MS      See HPI      Complains of low back pain.      left knee is painful at times  Derm      chronic dry skin with hyperkeratotic lesions   Physical Exam  General:     Well-developed,well-nourished,in no acute distress; alert,appropriate and cooperative throughout examinationoverweight-appearing.   Head:     Normocephalic and atraumatic without obvious abnormalities. No apparent alopecia or balding. Eyes:     No corneal or conjunctival inflammation noted. EOMI. Perrla. Funduscopic exam benign, without hemorrhages, exudates or papilledema. Vision grossly normal. Ears:     decreased hearing to normal voice Nose:     External nasal examination shows no deformity or inflammation. Nasal mucosa are pink and moist without lesions or exudates. Mouth:     Oral mucosa and oropharynx without lesions or exudates.  Teeth in good repair. Lungs:     Normal respiratory  effort, chest expands symmetrically. Lungs are clear to auscultation, no crackles or wheezes. Heart:     Normal rate and regular rhythm. S1 and S2 normal without gallop, murmur, click, rub or other extra sounds. Abdomen:     obvesesoft, non-tender, no hepatomegaly, and no splenomegaly.   Prostate:     Prostate enlarge and tenderno nodules and no asymmetry.   Skin:     many hyperkeratotic lesions over body erythematous itch rash on lower legs Cervical Nodes:     No lymphadenopathy noted Inguinal Nodes:     No significant adenopathy Psych:     stressed considerably , tking care of an ill brother, who is 28 mos younger than pt    Impression & Recommendations:  Problem # 1:  URINARY URGENCY (ZOX-096.04) Assessment: New  Orders: UA Dipstick w/o Micro (manual) (54098)   Problem # 2:  ACUTE PROSTATITIS (ICD-601.0) Assessment: New  Problem # 3:  DERMATITIS, ATOPIC (ICD-691.8) Assessment: New  Problem # 4:  HYPERLIPIDEMIA (ICD-272.4) Assessment: Improved  The following medications were removed from the medication list:    Lipitor 40 Mg Tabs (Atorvastatin calcium) ..... Once daily  His updated medication list for this problem includes:    Simvastatin 80 Mg Tabs (Simvastatin) .Marland Kitchen... 1 hs for cholesterol  elevation   Complete Medication List: 1)  Lorcet 10/650 10-650 Mg Tabs (Hydrocodone-acetaminophen) .Marland Kitchen.. 1 tablet by mouth every four to six hours 2)  Zoloft 100 Mg Tabs (Sertraline hcl) .... Two times a day 3)  Amitriptyline Hcl 100 Mg Tabs (Amitriptyline hcl) .... Take 1 at bedtime 4)  Meprobamate 400 Mg Tabs (Meprobamate) .Marland Kitchen.. 1 by mouth four times a day 5)  Baby Aspirin 81 Mg Chew (Aspirin) .Marland Kitchen.. 1 once daily 6)  Triamterene-hctz 75-50 Mg Tabs (Triamterene-hctz) .Marland Kitchen.. 1 by mouth once daily 7)  Doxycycline Hyclate 100 Mg Caps (Doxycycline hyclate) .Marland Kitchen.. 1 two times a day for prostatitis 8)  Simvastatin 80 Mg Tabs (Simvastatin) .Marland Kitchen.. 1 hs for cholesterol elevation   Patient  Instructions: 1)  doxycycline  100 mg two times a day 2)   for prostatitis 3)  changed lipitor to simvastatin for high cholesterol 4)  enablex 7.5 mg each day for urinary urgency 5)  Xyal 5 mg each day for rash 6)  return 2 weeks for rechecking prostate 7)  refilled other medications   Prescriptions: LORCET 10/650 10-650 MG TABS (HYDROCODONE-ACETAMINOPHEN) 1 tablet by mouth every four to six hours  #100 x 5   Entered and Authorized by:   Judithann Sheen MD   Signed by:   Judithann Sheen MD on 01/19/2008   Method used:   Faxed to ...       [facility]       [address]       [place], [place]  [address]       Ph: [phone]       Fax: [phone]   RxID:   [id number] SIMVASTATIN 80 MG  TABS (SIMVASTATIN) 1 hs for cholesterol elevation  #30 x 11   Entered and Authorized by:   Judithann Sheen MD   Signed by:   Judithann Sheen MD on 01/19/2008   Method used:   Electronically sent to ...       [facility]       [address]       [place], [place]  [address]       Ph: [phone]       Fax: [phone]   RxID:   [id number] DOXYCYCLINE HYCLATE 100 MG  CAPS (DOXYCYCLINE HYCLATE) 1 two times a day for prostatitis  #30 x 1   Entered and Authorized by:   Judithann Sheen MD   Signed by:   Judithann Sheen MD on 01/19/2008   Method used:   Electronically sent to ...       [facility]       [address]       [place], [place]  [address]       Ph: [phone]       Fax: [phone]   RxID:   [id number] MEPROBAMATE 400 MG  TABS (MEPROBAMATE) 1 by mouth four times a day  #100 x 11   Entered and Authorized by:   Judithann Sheen MD   Signed by:   Judithann Sheen MD on 01/19/2008   Method used:   Electronically sent to ...       [facility]       [address]       [place], [place]   [address]       Ph: [phone]       Fax: [phone]   RxID:   [id number]  ]

## 2010-08-14 NOTE — Consult Note (Signed)
Summary: [facility]  [facility]   Imported By: Maryln Gottron 05/28/2010 15:33:07  _____________________________________________________________________  External Attachment:    Type:   Image     Comment:   External Document

## 2010-08-14 NOTE — Assessment & Plan Note (Signed)
Summary: EMP PATIENT FASTING/MHF   Vital Signs:  Patient Profile:   77 Years Old Male Weight:      248 pounds O2 Sat:      98 % Temp:     98 degrees F Pulse rate:   95 / minute BP sitting:   140 / 92  (left arm) Cuff size:   large  Vitals Entered By: Pura Spice, RN (August 15, 2008 10:27 AM)                 Chief Complaint:  yrly eval  .  History of Present Illness: Pt in for yearly evaluation Had accident in Nov, went to [facility] ER, had fracture left 7th rib, pain continues onset rash both lower legs has had previously which responded to tx Lesion on penis  past several weeksBP stable anxiety deopression under good controll    Current Allergies (reviewed today): SULFAMETHOXAZOLE (SULFAMETHOXAZOLE)     Review of Systems      See HPI  General      Denies chills, fatigue, fever, loss of appetite, malaise, sleep disorder, sweats, weakness, and weight loss.  Eyes      Denies blurring, discharge, double vision, eye irritation, eye pain, halos, itching, light sensitivity, red eye, vision loss-1 eye, and vision loss-both eyes.  ENT      Denies decreased hearing, difficulty swallowing, ear discharge, earache, hoarseness, nasal congestion, nosebleeds, postnasal drainage, ringing in ears, sinus pressure, and sore throat.  CV      Denies bluish discoloration of lips or nails, chest pain or discomfort, difficulty breathing at night, difficulty breathing while lying down, fainting, fatigue, leg cramps with exertion, lightheadness, near fainting, palpitations, shortness of breath with exertion, swelling of feet, swelling of hands, and weight gain.      hypertension controlled  Resp      Denies chest discomfort, chest pain with inspiration, cough, coughing up blood, excessive snoring, hypersomnolence, morning headaches, pleuritic, shortness of breath, sputum productive, and wheezing.  GI      Complains of indigestion.  GU      Complains of genital sores.   MS      Denies joint pain, joint redness, joint swelling, loss of strength, low back pain, mid back pain, muscle aches, muscle , cramps, muscle weakness, stiffness, and thoracic pain.  Derm      See HPI      Complains of rash.  Neuro      Denies brief paralysis, difficulty with concentration, disturbances in coordination, falling down, headaches, inability to speak, memory loss, numbness, poor balance, seizures, sensation of room spinning, tingling, tremors, visual disturbances, and weakness.  Psych      Complains of anxiety and depression.   Physical Exam  General:     Well-developed,well-nourished,in no acute distress; alert,appropriate and cooperative throughout examinationoverweight-appearing.   Head:     Normocephalic and atraumatic without obvious abnormalities. No apparent alopecia or balding. Eyes:     No corneal or conjunctival inflammation noted. EOMI. Perrla. Funduscopic exam benign, without hemorrhages, exudates or papilledema. Vision grossly normal. Ears:     External ear exam shows no significant lesions or deformities.  Otoscopic examination reveals clear canals, tympanic membranes are intact bilaterally without bulging, retraction, inflammation or discharge. Hearing is grossly normal bilaterally. Nose:     External nasal examination shows no deformity or inflammation. Nasal mucosa are pink and moist without lesions or exudates. Mouth:     Oral mucosa and oropharynx without lesions or exudates.  Teeth in good repair. Neck:     No deformities, masses, or tenderness noted. Chest Wall:     tenderness over lateral 7 rib on left Breasts:     No masses or gynecomastia noted Lungs:     Normal respiratory effort, chest expands symmetrically. Lungs are clear to auscultation, no crackles or wheezes. Heart:     Normal rate and regular rhythm. S1 and S2 normal without gallop, murmur, click, rub or other extra sounds. Abdomen:     Bowel sounds positive,abdomen soft and  non-tender without masses, organomegaly or hernias noted. Rectal:     No external abnormalities noted. Normal sphincter tone. No rectal masses or tenderness. Genitalia:     .5cm shallow ulceration rt base of glans, Ag nitrat5e applied Prostate:     no nodules, no asymmetry, and 1+ enlarged.   Msk:     No deformity or scoliosis noted of thoracic or lumbar spine.   Pulses:     R and L carotid,radial,femoral,dorsalis pedis and posterior tibial pulses are full and equal bilaterally Extremities:     2 plus pretibial edema Neurologic:     No cranial nerve deficits noted. Station and gait are normal. Plantar reflexes are down-going bilaterally. DTRs are symmetrical throughout. Sensory, motor and coordinative functions appear intact.    Impression & Recommendations:  Problem # 1:  ECZEMA (ICD-692.9) Assessment: New  Problem # 2:  CLOSED FRACTURE OF ONE RIB (ICD-807.01) Assessment: Improved  Problem # 3:  FAMILY STRESS (ICD-V61.9) Assessment: Unchanged  Problem # 4:  GERD (ICD-530.81) Assessment: Improved  His updated medication list for this problem includes:    Prilosec 20 Mg Cpdr (Omeprazole) .Marland Kitchen... 1 qd   Problem # 5:  HYPERTENSION (ICD-401.9) Assessment: Improved  His updated medication list for this problem includes:    Triamterene-hctz 75-50 Mg Tabs (Triamterene-hctz) .Marland Kitchen... 1 by mouth once daily  Orders: EKG w/ Interpretation (93000)   Problem # 6:  DEPRESSION (ICD-311) Assessment: Improved  Problem # 7:  PENILE LESION (ICD-236.6) Assessment: New  Complete Medication List: 1)  Lorcet 10/650 10-650 Mg Tabs (Hydrocodone-acetaminophen) .Marland Kitchen.. 1 tablet by mouth every four to six hours 2)  Zoloft 100 Mg Tabs (Sertraline hcl) .... Two times a day 3)  Amitriptyline Hcl 100 Mg Tabs (Amitriptyline hcl) .... Take 1 at bedtime 4)  Meprobamate 400 Mg Tabs (Meprobamate) .Marland Kitchen.. 1 by mouth four times a day 5)  Baby Aspirin 81 Mg Chew (Aspirin) .Marland Kitchen.. 1 once daily 6)   Triamterene-hctz 75-50 Mg Tabs (Triamterene-hctz) .Marland Kitchen.. 1 by mouth once daily 7)  Doxycycline Hyclate 100 Mg Caps (Doxycycline hyclate) .Marland Kitchen.. 1 two times a day for prostatitis 8)  Simvastatin 80 Mg Tabs (Simvastatin) .Marland Kitchen.. 1 hs for cholesterol elevation 9)  Prilosec 20 Mg Cpdr (Omeprazole) .Marland Kitchen.. 1 qd 10)  Lotrisone 1-0.05 % Crea (Clotrimazole-betamethasone) .... Apply to lesion two times a day for 14 days 11)  Vesicare 10 Mg Tabs (Solifenacin succinate) .Marland Kitchen.. 1 am and  pm for urinary urgency 12)  Vitamin D 16109 Unit Caps (Ergocalciferol) .Marland Kitchen.. 1 by mouth weekly.  Other Orders: UA Dipstick w/o Micro (automated)  (81003) Venipuncture (60454) T-Vitamin D (25-Hydroxy) (09811-91478) Tetanus Toxoid w/Dx (29562) Pneumoccal Vaccine Adm- Medicare (Z3086) Admin 1st Vaccine (57846) Admin of Any Addtl Vaccine (96295) Depo- Medrol 80mg  (J1040) Depo- Medrol 40mg  (J1030) Admin of Therapeutic Inj  intramuscular or subcutaneous (28413) TLB-Lipid Panel (80061-LIPID) TLB-BMP (Basic Metabolic Panel-BMET) (80048-METABOL) TLB-CBC Platelet - w/Differential (85025-CBCD) TLB-Hepatic/Liver Function Pnl (80076-HEPATIC) TLB-TSH (Thyroid Stimulating Hormone) (84443-TSH) TLB-PSA (  Prostate Specific Antigen) (84153-PSA)   Patient Instructions: 1)  continue medications as prescribe 2)  apply cream to penis two times a day 3)  for 14 days, return if does not heal 4)  after we get the lab reports I will tell you what to do about your rt sided pain 5)  take diuretic twice a day until edema is cleared   Prescriptions: VITAMIN D 04540 UNIT CAPS (ERGOCALCIFEROL) 1 by mouth weekly.  #12 x 0   Entered by:   Pura Spice, RN   Authorized by:   Judithann Sheen MD   Signed by:   Pura Spice, RN on 08/21/2008   Method used:   Electronically to        [facility].* (retail)       [address]       [place], [place]  [address]       Ph: [phone]       Fax: [phone]   RxID:    [id number] VESICARE 10 MG TABS (SOLIFENACIN SUCCINATE) 1 AM and  PM for urinary urgency  #60 x 11   Entered and Authorized by:   Judithann Sheen MD   Signed by:   Judithann Sheen MD on 08/15/2008   Method used:   Electronically to        [facility].* (retail)       [address]       [place], [place]  [address]       Ph: [phone]       Fax: [phone]   RxID:   [id number] LOTRISONE 1-0.05 % CREA (CLOTRIMAZOLE-BETAMETHASONE) apply to lesion two times a day for 14 days  #30 gm x 5   Entered and Authorized by:   Judithann Sheen MD   Signed by:   Judithann Sheen MD on 08/15/2008   Method used:   Electronically to        [facility].* (retail)       [address]       [place], [place]  [address]       Ph: [phone]       Fax: [phone]   RxID:   [id number] MEPROBAMATE 400 MG  TABS (MEPROBAMATE) 1 by mouth four times a day  #100 x 11   Entered and Authorized by:   Judithann Sheen MD   Signed by:   Judithann Sheen MD on 08/15/2008   Method used:   Print then Give to Patient   RxID:   [id number] PRILOSEC 20 MG  CPDR (OMEPRAZOLE) 1 qd  #30 x 11   Entered and Authorized by:   Judithann Sheen MD   Signed by:   Judithann Sheen MD on 08/15/2008   Method used:   Electronically to        [facility].* (retail)       [address]       [place], [place]  [address]       Ph: [phone]       Fax: [phone]   RxID:   [id number] SIMVASTATIN 80 MG  TABS (SIMVASTATIN) 1 hs for cholesterol elevation  #  30 x 11   Entered and Authorized by:   Judithann Sheen MD   Signed by:   Judithann Sheen MD on 08/15/2008   Method used:   Electronically to        [facility].* (retail)       [address]       [place], [place]  [address]       Ph: [phone]       Fax: [phone]    RxID:   [id number] TRIAMTERENE-HCTZ 75-50 MG  TABS (TRIAMTERENE-HCTZ) 1 by mouth once daily  #180 x 3   Entered and Authorized by:   Judithann Sheen MD   Signed by:   Judithann Sheen MD on 08/15/2008   Method used:   Electronically to        [facility].* (retail)       [address]       [place], [place]  [address]       Ph: [phone]       Fax: [phone]   RxID:   [id number] AMITRIPTYLINE HCL 100 MG  TABS (AMITRIPTYLINE HCL) take 1 at bedtime  #30 x 11   Entered and Authorized by:   Judithann Sheen MD   Signed by:   Judithann Sheen MD on 08/15/2008   Method used:   Electronically to        [facility].* (retail)       [address]       [place], [place]  [address]       Ph: [phone]       Fax: [phone]   RxID:   [id number] ZOLOFT 100 MG  TABS (SERTRALINE HCL) two times a day  #60 x 11   Entered and Authorized by:   Judithann Sheen MD   Signed by:   Judithann Sheen MD on 08/15/2008   Method used:   Electronically to        [facility].* (retail)       [address]       [place], [place]  [address]       Ph: [phone]       Fax: [phone]   RxID:   [id number] LORCET 10/650 10-650 MG TABS (HYDROCODONE-ACETAMINOPHEN) 1 tablet by mouth every four to six hours  #100 x 5   Entered and Authorized by:   Judithann Sheen MD   Signed by:   Judithann Sheen MD on 08/15/2008   Method used:   Print then Give to Patient   RxID:   [id number]    Tetanus/Td Vaccine    Vaccine Type: Td    Site: left deltoid    Mfr: Sanofi Pasteur    Dose: 0.5 ml    Route: IM    Given by: Pura Spice, RN    Exp. Date: 12/12/2009    Lot #: [id number]  Pneumovax Vaccine    Vaccine Type: Pneumovax (Medicare)    Site: right deltoid    Mfr: Merck    Dose: 0.5 ml    Route: IM    Given by: Pura Spice, RN    Exp.  Date: 08/10/2009    Lot #: [id number]   Laboratory Results   Urine Tests   Date/Time Reported: August 15, 2008 12:04 PM   Routine Urinalysis   Color: yellow Appearance: Clear Glucose: negative   (Normal Range: Negative) Bilirubin: negative   (Normal Range: Negative) Ketone: negative   (Normal Range: Negative) Spec. Gravity: 1.015   (Normal Range: 1.003-1.035) Blood: negative   (Normal Range: Negative) pH: 7.0   (Normal Range: 5.0-8.0) Protein: negative   (Normal Range: Negative) Urobilinogen: 0.2   (Normal Range: 0-1) Nitrite: negative   (Normal Range: Negative) Leukocyte Esterace: trace   (Normal Range: Negative)    Comments: Wynona Canes, CMA  August 15, 2008 12:04 PM       Medication Administration  Injection # 1:    Medication: Depo- Medrol 80mg     Diagnosis: CONTACT DERMATITIS (ICD-692.9)    Route: IM    Site: RUOQ gluteus    Exp Date: 08/2009    Lot #: [id number]    Mfr: teva    Comments: 120 mg given    Patient tolerated injection without complications    Given by: Pura Spice, RN (August 15, 2008 1:46 PM)  Injection # 2:    Medication: Depo- Medrol 40mg     Diagnosis: CONTACT DERMATITIS (ICD-692.9)    Route: IM    Site: RUOQ gluteus    Exp Date: 08/2009    Lot #: [id number]    Mfr: teva    Patient tolerated injection without complications    Given by: Pura Spice, RN (August 15, 2008 1:47 PM)  Orders Added: 1)  UA Dipstick w/o Micro (automated)  [81003] 2)  EKG w/ Interpretation [93000] 3)  Venipuncture [14782] 4)  T-Vitamin D (25-Hydroxy) [95621-30865] 5)  Tetanus Toxoid w/Dx [78469] 6)  Pneumoccal Vaccine Adm- Medicare [G0009] 7)  Admin 1st Vaccine [90471] 8)  Admin of Any Addtl Vaccine [90472] 9)  Depo- Medrol 80mg  [J1040] 10)  Depo- Medrol 40mg  [J1030] 11)  Admin of Therapeutic Inj  intramuscular or subcutaneous [96372] 12)  TLB-Lipid Panel [80061-LIPID] 13)  TLB-BMP (Basic Metabolic Panel-BMET) [80048-METABOL] 14)  TLB-CBC  Platelet - w/Differential [85025-CBCD] 15)  TLB-Hepatic/Liver Function Pnl [80076-HEPATIC] 16)  TLB-TSH (Thyroid Stimulating Hormone) [84443-TSH] 17)  TLB-PSA (Prostate Specific Antigen) [84153-PSA] 18)  Est. Patient Level IV [62952]

## 2010-08-14 NOTE — Assessment & Plan Note (Signed)
Summary: F/U POST (BIOPSY) // RS   Vital Signs:  Patient profile:   77 year old male Height:      71 inches (180.34 cm) Weight:      238 pounds (108.18 kg) O2 Sat:      97 % on Room air Temp:     98.3 degrees F (36.83 degrees C) oral Pulse rate:   92 / minute BP sitting:   138 / 78  (left arm) Cuff size:   large  Vitals Entered By: Josph Macho RMA (May 12, 2010 11:49 AM)  O2 Flow:  Room air CC: Follow-up visit on biopsy/ CF Is Patient Diabetic? No   History of Present Illness: this 76 year old Caucasian male in today for followup after seeing urology last week. He had some persistent penile lesion lesions and was uncircumcised so was sent to urology where he actually underwent a penile biopsy. Results are still pending but they're concerned he will need to undergo a circumcision if not further excision of both lesions once results are available. On EKG prior to procedure they did see a left bundle branch block after her visit Dr. Charmian Muff chart he has had that for reviewing his chart with notes dating back to 2007 and  there is no stress test available at this moment. Urology is requesting cardiac clearance so we will refer him. He is a accompanied by his son Jasson Siegmann and daughter-in-law,  Louie Boston. Air in the process of moving a patient in with him as he is becoming more forgetful and more anxious when he drives and forgetting to take his medications.they are concerned that he is starting to have some memory deficiencies at this time. He is denying any recent illness, fevers, chills, chest pain, palpitations, shortness of breath, GI or GU complaints. His son is noting to recent with edema, he is very inactive and does not move the day and yesterday in particular his lower extremities were notably swollen today they are somewhat improved  Current Medications (verified): 1)  Lorcet 10/650 10-650 Mg Tabs (Hydrocodone-Acetaminophen) .Marland Kitchen.. 1 Tablet By Mouth Every Four To Six  Hours 2)  Zoloft 100 Mg  Tabs (Sertraline Hcl) .... Two Times A Day 3)  Amitriptyline Hcl 100 Mg  Tabs (Amitriptyline Hcl) .... Take 1 At Bedtime 4)  Meprobamate 400 Mg  Tabs (Meprobamate) .Marland Kitchen.. 1 By Mouth Four Times A Day 5)  Baby Aspirin 81 Mg  Chew (Aspirin) .Marland Kitchen.. 1 Once Daily 6)  Triamterene-Hctz 75-50 Mg  Tabs (Triamterene-Hctz) .Marland Kitchen.. 1 By Mouth Once Daily 7)  Simvastatin 80 Mg  Tabs (Simvastatin) .Marland Kitchen.. 1 Hs For Cholesterol Elevation 8)  Prilosec 20 Mg  Cpdr (Omeprazole) .... Otc 9)  Tobramycin Sulfate 0.3 % Soln (Tobramycin Sulfate) .Marland Kitchen.. 1 Qtt in Left Eye Tid 10)  Bactroban 2 % Oint (Mupirocin) .... Apply To Skin Below Left Eye Tid 11)  Vesicare 5 Mg Tabs (Solifenacin Succinate) .Marland Kitchen.. 1 By Mouth Once Daily 12)  Valtrex 500 Mg Tabs (Valacyclovir Hcl) .... 2 Tabs By Mouth Three Times A Day  For Herpes Infection X 7 Days and Then Decrease Back To 1 Bid 13)  First-Dukes Mouthwash  Susp (Diphenhyd-Hydrocort-Nystatin) .... 5 Cc By Mouth Qid As Needed Lesions X 14 Days 14)  Diflucan 100 Mg Tabs (Fluconazole) .Marland Kitchen.. 1 Tab By Mouth Q Wk X 2 Wk 15)  Baclofen 20 Mg Tabs (Baclofen) .Marland Kitchen.. 1 Tab Two Times A Day As Needed For Pain 16)  Ketoconazole 2 % Crea (Ketoconazole) .... Apply Two  Times A Day As Needed Rash  Allergies (verified): 1)  Sulfamethoxazole (Sulfamethoxazole)  Past History:  Past medical history reviewed for relevance to current acute and chronic problems. Social history (including risk factors) reviewed for relevance to current acute and chronic problems.  Past Medical History: Reviewed history from 02/02/2007 and no changes required. Allergic rhinitis Depression GERD Hyperlipidemia Hypertension HOH-[facility]  Social History: Reviewed history from 02/02/2007 and no changes required. Retired Divorced Never Smoked Alcohol use-no Drug use-no Regular exercise-no  Review of Systems      See HPI  Physical Exam  General:  Well-developed,well-nourished,in no acute  distress; alert,appropriate and cooperative throughout examination Neck:  No deformities, masses, or tenderness noted. Lungs:  Normal respiratory effort, chest expands symmetrically. Lungs are clear to auscultation, no crackles or wheezes. Heart:  Normal rate and regular rhythm. S1 and S2 normal without gallop, murmur, click, rub or other extra sounds. Abdomen:  Bowel sounds positive,abdomen soft and non-tender without masses, organomegaly or hernias noted. Extremities:  trace left pedal edema and trace right pedal edema.   Psych:  Cognition and judgment appear intact. Alert and cooperative with normal attention span and concentration. No apparent delusions, illusions, hallucinations   Impression & Recommendations:  Problem # 1:  PENILE LESION (ICD-236.6)  Orders: UA Dipstick w/o Micro (automated)  (81003) Will likely require surgical correction, will refer to [facility] for PreOp clearance due to his complicated history.  Problem # 2:  LBBB (ICD-426.3)  Orders: Cardiology Referral (Cardiology) Asymptomatic  Problem # 3:  ANEMIA (ICD-285.9)  Orders: Cardiology Referral (Cardiology) CBC checked today along with BMP, hepatic and TSH  Problem # 4:  HYPERTENSION (ICD-401.9)  His updated medication list for this problem includes:    Triamterene-hctz 75-50 Mg Tabs (Triamterene-hctz) .Marland Kitchen... 1 by mouth once daily    Furosemide 20 Mg Tabs (Furosemide) .Marland Kitchen... 1 tab by mouth daily as needed for sob/edema/wt gain > 3# in 24 hr  Orders: Cardiology Referral (Cardiology) TLB-Renal Function Panel (80069-RENAL) TLB-CBC Platelet - w/Differential (85025-CBCD) TLB-Hepatic/Liver Function Pnl (80076-HEPATIC) Venipuncture (16109) Specimen Handling (60454) Mild elevation along with some pedal edema will start Lasix as needed and reeval at next visit  Complete Medication List: 1)  Lorcet 10/650 10-650 Mg Tabs (Hydrocodone-acetaminophen) .Marland Kitchen.. 1 tablet by mouth every four to six hours 2)   Zoloft 100 Mg Tabs (Sertraline hcl) .... Two times a day 3)  Amitriptyline Hcl 100 Mg Tabs (Amitriptyline hcl) .... Take 1 at bedtime 4)  Meprobamate 400 Mg Tabs (Meprobamate) .Marland Kitchen.. 1 by mouth four times a day 5)  Baby Aspirin 81 Mg Chew (Aspirin) .Marland Kitchen.. 1 once daily 6)  Triamterene-hctz 75-50 Mg Tabs (Triamterene-hctz) .Marland Kitchen.. 1 by mouth once daily 7)  Prilosec 20 Mg Cpdr (Omeprazole) .... Otc 8)  Tobramycin Sulfate 0.3 % Soln (Tobramycin sulfate) .Marland Kitchen.. 1 qtt in left eye tid 9)  Bactroban 2 % Oint (Mupirocin) .... Apply to skin below left eye tid 10)  Vesicare 5 Mg Tabs (Solifenacin succinate) .Marland Kitchen.. 1 by mouth once daily 11)  Valtrex 500 Mg Tabs (Valacyclovir hcl) .... 2 tabs by mouth three times a day  for herpes infection x 7 days and then decrease back to 1 bid 12)  First-dukes Mouthwash Susp (Diphenhyd-hydrocort-nystatin) .... 5 cc by mouth qid as needed lesions x 14 days 13)  Diflucan 100 Mg Tabs (Fluconazole) .Marland Kitchen.. 1 tab by mouth q wk x 2 wk 14)  Baclofen 20 Mg Tabs (Baclofen) .Marland Kitchen.. 1 tab two times a day as needed for  pain 15)  Ketoconazole 2 % Crea (Ketoconazole) .... Apply two times a day as needed rash 16)  Simvastatin 40 Mg Tabs (Simvastatin) .Marland Kitchen.. 1 tab by mouth at bedtime 17)  Furosemide 20 Mg Tabs (Furosemide) .Marland Kitchen.. 1 tab by mouth daily as needed for sob/edema/wt gain > 3# in 24 hr  Other Orders: TLB-TSH (Thyroid Stimulating Hormone) (16109-UEA)  Patient Instructions: 1)  Please schedule a follow-up appointment in 2 weeks, needs MMSE 2)  Add Benefiber 2 tsp by mouth daily and/or yogurt daily 3)  For Edema elevate feet above heart 15 minutes two times a day and move feet frequently when sitting, minimize salt. Try compression stockings on in am and off in pm, use Furosemied for extra fluid. 4)  Recommend against driving against driving at least until Cardiac clearance is complete. Prescriptions: FUROSEMIDE 20 MG TABS (FUROSEMIDE) 1 tab by mouth daily as needed for SOB/edema/wt gain > 3# in  24 hr  #30 x 1   Entered and Authorized by:   Danise Edge MD   Signed by:   Danise Edge MD on 05/12/2010   Method used:   Electronically to        [facility].* (retail)       [address]       [place], [place]  [address]       Ph: [phone]       Fax: [phone]   RxID:   [id number] SIMVASTATIN 40 MG TABS (SIMVASTATIN) 1 tab by mouth at bedtime  #30 x 3   Entered and Authorized by:   Danise Edge MD   Signed by:   Danise Edge MD on 05/12/2010   Method used:   Electronically to        [facility].* (retail)       [address]       [place], [place]  [address]       Ph: [phone]       Fax: [phone]   RxID:   [id number]    Orders Added: 1)  Cardiology Referral [Cardiology] 2)  TLB-Renal Function Panel [80069-RENAL] 3)  TLB-CBC Platelet - w/Differential [85025-CBCD] 4)  TLB-Hepatic/Liver Function Pnl [80076-HEPATIC] 5)  TLB-TSH (Thyroid Stimulating Hormone) [84443-TSH] 6)  UA Dipstick w/o Micro (automated)  [81003] 7)  Venipuncture [30160] 8)  Specimen Handling [99000]  Appended Document: F/U POST (BIOPSY) // RS  Laboratory Results   Urine Tests    Routine Urinalysis   Color: yellow Appearance: Clear Glucose: negative   (Normal Range: Negative) Bilirubin: negative   (Normal Range: Negative) Ketone: negative   (Normal Range: Negative) Spec. Gravity: 1.015   (Normal Range: 1.003-1.035) Blood: negative   (Normal Range: Negative) pH: 6.0   (Normal Range: 5.0-8.0) Protein: negative   (Normal Range: Negative) Urobilinogen: 0.2   (Normal Range: 0-1) Nitrite: negative   (Normal Range: Negative) Leukocyte Esterace: negative   (Normal Range: Negative)    Comments: Rita Ohara  May 13, 2010 8:29 AM

## 2010-08-14 NOTE — Progress Notes (Signed)
Summary: rx for valtrex 500 mg   Phone Note From Pharmacy   Caller: [facility].* Reason for Call: Needs renewal Summary of Call: requesting rex for zovirax cream  Initial call taken by: Pura Spice, RN,  Nov 20, 2009 1:54 PM  Follow-up for Phone Call        not on pt med list. Dr Scotty Court called pt and to call in rx for valtrex  Follow-up by: Pura Spice, RN,  Nov 20, 2009 1:55 PM    New/Updated Medications: VALTREX 500 MG TABS (VALACYCLOVIR HCL) 1 by mouth two times a day  for herpes infection Prescriptions: VALTREX 500 MG TABS (VALACYCLOVIR HCL) 1 by mouth two times a day  for herpes infection  #60 x 11   Entered by:   Pura Spice, RN   Authorized by:   Judithann Sheen MD   Signed by:   Pura Spice, RN on 11/20/2009   Method used:   Electronically to        [facility].* (retail)       [address]       [place], [place]  [address]       Ph: [phone]       Fax: [phone]   RxID:   [id number]   Appended Document: rx for valtrex 500 mg  verify with patient he takes it regularly and for what and then can have same sig, #60, no rf  Appended Document: rx for valtrex 500 mg  Think this was looked at on accident. Pts last refill was with 11 addt refills.

## 2010-08-14 NOTE — Progress Notes (Signed)
  Phone Note From Other Clinic   Summary of Call: per pam Dr Volney American pt needs clearance for penial cancer. ofc [id number] [phone] fax [phone] Initial call taken by: Edman Circle,  May 16, 2010 4:23 PM  Follow-up for Phone Call        spoke with pam, pt scheduled for stress test 05-28-10 and then clearence will be decided Deliah Goody, RN  May 16, 2010 4:37 PM

## 2010-08-22 ENCOUNTER — Encounter: Payer: Self-pay | Admitting: Family Medicine

## 2010-09-11 ENCOUNTER — Encounter: Payer: Self-pay | Admitting: Family Medicine

## 2010-09-11 ENCOUNTER — Ambulatory Visit (INDEPENDENT_AMBULATORY_CARE_PROVIDER_SITE_OTHER): Payer: PRIVATE HEALTH INSURANCE | Admitting: Family Medicine

## 2010-09-11 VITALS — BP 130/70 | HR 92 | Temp 98.2°F | Wt 230.0 lb

## 2010-09-11 DIAGNOSIS — E785 Hyperlipidemia, unspecified: Secondary | ICD-10-CM

## 2010-09-11 DIAGNOSIS — I10 Essential (primary) hypertension: Secondary | ICD-10-CM

## 2010-09-11 DIAGNOSIS — K120 Recurrent oral aphthae: Secondary | ICD-10-CM

## 2010-09-11 DIAGNOSIS — R350 Frequency of micturition: Secondary | ICD-10-CM

## 2010-09-11 DIAGNOSIS — F419 Anxiety disorder, unspecified: Secondary | ICD-10-CM

## 2010-09-11 DIAGNOSIS — F341 Dysthymic disorder: Secondary | ICD-10-CM

## 2010-09-11 DIAGNOSIS — R609 Edema, unspecified: Secondary | ICD-10-CM

## 2010-09-11 DIAGNOSIS — F329 Major depressive disorder, single episode, unspecified: Secondary | ICD-10-CM

## 2010-09-11 MED ORDER — TRIAMTERENE-HCTZ 75-50 MG PO TABS: 1.0000 | ORAL_TABLET | Freq: Every day | ORAL | Status: DC

## 2010-09-11 MED ORDER — AMITRIPTYLINE HCL 100 MG PO TABS: 100.0000 mg | ORAL_TABLET | Freq: Every day | ORAL | Status: DC

## 2010-09-11 MED ORDER — HYDROCODONE-ACETAMINOPHEN 10-650 MG PO TABS: 1.0000 | ORAL_TABLET | Freq: Four times a day (QID) | ORAL | Status: DC | PRN

## 2010-09-11 MED ORDER — SERTRALINE HCL 100 MG PO TABS: 100.0000 mg | ORAL_TABLET | Freq: Two times a day (BID) | ORAL | Status: DC

## 2010-09-11 MED ORDER — SIMVASTATIN 40 MG PO TABS: 40.0000 mg | ORAL_TABLET | Freq: Every day | ORAL | Status: DC

## 2010-09-12 ENCOUNTER — Ambulatory Visit: Payer: PRIVATE HEALTH INSURANCE | Admitting: Internal Medicine

## 2010-09-22 NOTE — Progress Notes (Signed)
  Subjective:    Patient ID: Hunter [name], male    DOB: 12/28/1933, 77 y.o.   MRN: [id number] This 77 year old white available for him of her cooperative is in today to review his medications and medical problems.newprob Morrie Sheldon and referred regarding a penile lesion he was referred to Dr. Patsi Sears has found him to have carcinoma for which he had surgery and is doing fine, has healed her nicely hypertension has been well controlled he continues to have oral viral stomatitis and uses Magic mouth wash as needed continues to have chronic back pain with some gait imbalance One of his new complaint is that of urinary frequency and heard a for her total treat him with VESIcare He relates that episode of right upper quadrant abdominal tenderness which she had fever, with same but no diagnosis was ever made but the patient improved. He continues to have episodes of anxiety which Bactroban might have control HPI    Review of Systems see H&P     Objective:   Physical Exam the patient is a well-developed well-nourished male who appear to be in no distress and non-complaining HEENT negative except for small viral lesions over the tongue and appear to be healing Chronic pulses are good thyroid normal Chest lungs clear to palpation percussion and auscultation\ Heart pulse size no murmurs regular rhythm Abdomen liver spleen and kidneys are nonpalpable normal bowel sounds Examination the penis reveals a portion of the glans penis to be removed but is healing well Extremities trace pretibial edema      Assessment & Plan:   patient is doing her well and will continue medications as prescribed previously he continues to need the amitriptyline and meprobamate 4 depression and anxiety need to continue omeprazole for GERD documented he should keep on the sertraline for depression. Continue Maxide for dental, Valtrex for his herpetic lesions and simvastatin for hyperlipidemia

## 2010-09-22 NOTE — Patient Instructions (Signed)
Past findings you're doing her well physically he check out fine You continue to have the viral stomatitis continue the Magic mouthwash and CT scan of the Valtrex take one 500 mg 3 times daily. Continue her medications for anxiety depression as well as for hyperlipidemia I will refill her hydrocodone since you do have chronic back pain

## 2010-09-23 LAB — POCT I-STAT 4, (NA,K, GLUC, HGB,HCT)
Glucose, Bld: 113 mg/dL — ABNORMAL HIGH (ref 70–99)
HCT: 46 % (ref 39.0–52.0)
Hemoglobin: 15.6 g/dL (ref 13.0–17.0)
Potassium: 3.4 mEq/L — ABNORMAL LOW (ref 3.5–5.1)
Sodium: 142 mEq/L (ref 135–145)

## 2010-09-24 LAB — POCT I-STAT 4, (NA,K, GLUC, HGB,HCT)
Glucose, Bld: 115 mg/dL — ABNORMAL HIGH (ref 70–99)
HCT: 45 % (ref 39.0–52.0)
Hemoglobin: 15.3 g/dL (ref 13.0–17.0)
Potassium: 3.3 mEq/L — ABNORMAL LOW (ref 3.5–5.1)
Sodium: 139 mEq/L (ref 135–145)

## 2010-11-28 ENCOUNTER — Telehealth: Payer: Self-pay

## 2010-11-28 MED ORDER — SOLIFENACIN SUCCINATE 5 MG PO TABS: 5.0000 mg | ORAL_TABLET | Freq: Every day | ORAL | Status: DC

## 2010-11-28 NOTE — Telephone Encounter (Signed)
[facility] pharmacy called pt said rx for vesicare had been sent in request received on  11/27/2010; ok per Dr. Scotty Court to refill

## 2011-04-08 ENCOUNTER — Encounter: Payer: Self-pay | Admitting: Internal Medicine

## 2011-04-08 ENCOUNTER — Ambulatory Visit: Payer: PRIVATE HEALTH INSURANCE | Admitting: Family Medicine

## 2011-04-08 ENCOUNTER — Ambulatory Visit (INDEPENDENT_AMBULATORY_CARE_PROVIDER_SITE_OTHER): Payer: Medicare Other | Admitting: Internal Medicine

## 2011-04-08 DIAGNOSIS — E039 Hypothyroidism, unspecified: Secondary | ICD-10-CM

## 2011-04-08 DIAGNOSIS — R413 Other amnesia: Secondary | ICD-10-CM

## 2011-04-08 DIAGNOSIS — M545 Low back pain: Secondary | ICD-10-CM

## 2011-04-08 DIAGNOSIS — R270 Ataxia, unspecified: Secondary | ICD-10-CM

## 2011-04-08 DIAGNOSIS — R279 Unspecified lack of coordination: Secondary | ICD-10-CM

## 2011-04-08 DIAGNOSIS — R269 Unspecified abnormalities of gait and mobility: Secondary | ICD-10-CM

## 2011-04-08 DIAGNOSIS — R2681 Unsteadiness on feet: Secondary | ICD-10-CM

## 2011-04-08 DIAGNOSIS — E785 Hyperlipidemia, unspecified: Secondary | ICD-10-CM

## 2011-04-08 DIAGNOSIS — I10 Essential (primary) hypertension: Secondary | ICD-10-CM

## 2011-04-08 NOTE — Assessment & Plan Note (Signed)
Pt complains of chronic low back pain.   Symptoms started months ago after one of his many falls.  Check lumbar x ray -- rule out compression fx.

## 2011-04-08 NOTE — Assessment & Plan Note (Addendum)
Pt has hx of multiple falls.  I suspect multifactorial etiology.  Rule out previous cerebellar stroke - check CT of head His medications may be contributing factor.  Pt advised to reduce dose of elavil and narcotic pain meds Arrange PT eval

## 2011-04-08 NOTE — Progress Notes (Signed)
Subjective:    Patient ID: Hunter [name], male    DOB: 1934-03-18, 78 y.o.   MRN: [id number]  HPI  77 y/o male presents with hx of multiple falls.  He is accompanied by his daughter in law.  He recently returned from visiting his brother.  1 month ago he injured left lower leg after missing a step.  He has kept area clean.  He still has scab and mild surrounding redness.  He notes other falls in the past resulted in right rib injury.  Also pt fell on left side and he has chronic left back /lower rib pain when he turns in bed.  Family members have noticed unsteady gait for months / years.    Review of Systems Negative for focal weakness,  No change in speech.  Memory loss Past Medical History  Diagnosis Date  . Hypertension   . GERD (gastroesophageal reflux disease)   . Anemia   . Depression   . Hyperlipidemia   . Vitamin D deficiency   . Chronic lower back pain   . Eczema     History   Social History  . Marital Status: Single    Spouse Name: N/A    Number of Children: N/A  . Years of Education: N/A   Occupational History  . Not on file.   Social History Main Topics  . Smoking status: Never Smoker   . Smokeless tobacco: Not on file  . Alcohol Use: No  . Drug Use: No  . Sexually Active: Not on file   Other Topics Concern  . Not on file   Social History Narrative  . No narrative on file    Past Surgical History  Procedure Date  . Rhinoplasty   . Tonsillectomy     Family History  Problem Relation Age of Onset  . Arthritis      family hx  . Hyperlipidemia      family hx  . Hypertension      family hx  . Prostate cancer      family hx  . Lung cancer Mother     Allergies  Allergen Reactions  . Sulfamethoxazole     REACTION: unspecified    Current Outpatient Prescriptions on File Prior to Visit  Medication Sig Dispense Refill  . amitriptyline (ELAVIL) 100 MG tablet Take 1 tablet (100 mg total) by mouth at bedtime.  30 tablet  11  . aspirin 81 MG  tablet Take 81 mg by mouth daily.        Marland Kitchen HYDROcodone-acetaminophen (LORCET) 10-650 MG per tablet Take 1 tablet by mouth every 6 (six) hours as needed.  100 tablet  5  . meprobamate (EQUANIL) 400 MG tablet Take 400 mg by mouth 4 (four) times daily as needed.        Marland Kitchen omeprazole (PRILOSEC) 20 MG capsule Take 20 mg by mouth daily.        . sertraline (ZOLOFT) 100 MG tablet Take 1 tablet (100 mg total) by mouth 2 (two) times daily.  60 tablet  11  . simvastatin (ZOCOR) 40 MG tablet Take 1 tablet (40 mg total) by mouth at bedtime. NEW DIRECTIONS, 1 HS  30 tablet  11  . solifenacin (VESICARE) 5 MG tablet Take 1 tablet (5 mg total) by mouth daily.  90 tablet  3  . triamterene-hydrochlorothiazide (MAXZIDE) 75-50 MG per tablet Take 1 tablet by mouth daily.  30 tablet  11  . Diphenhyd-Hydrocort-Nystatin (FIRST-DUKES MOUTHWASH MT) Use as directed  in the mouth or throat.          BP 124/76  Pulse 92  Temp(Src) 98.3 F (36.8 C) (Oral)  Ht 5\' 11"  (1.803 m)  Wt 223 lb (101.152 kg)  BMI 31.10 kg/m2        Objective:   Physical Exam   Constitutional: 77 y/o,  Appears older than stated age Head: Normocephalic and atraumatic.  Ears:  Hearing loss bilaterally Mouth/Throat: Oropharynx is clear and moist.  Eyes: Conjunctivae are normal. Pupils are equal, round, and reactive to light.  Neck: Normal range of motion. Neck supple. No thyromegaly present. No carotid bruit Cardiovascular: Normal rate, regular rhythm and normal heart sounds.  Exam reveals no gallop and no friction rub.   Pulmonary/Chest: Effort normal and breath sounds normal.  No wheezes. No rales.  Abdominal: Soft. Bowel sounds are normal. No mass. There is no tenderness.  Neurological: Alert. No cranial nerve deficit. oriented to person, place, month, year and date Unsteady gait,  Neg cerebellar signs.  Negative pronator drift,  Normal sensation to vibration of lower ext Skin: Skin is warm and dry.  Psychiatric: Normal mood and  affect. Behavior is normal.       Assessment & Plan:

## 2011-04-08 NOTE — Assessment & Plan Note (Signed)
Check B12 and TSH Consider check mini mental at next OV

## 2011-04-10 ENCOUNTER — Ambulatory Visit (INDEPENDENT_AMBULATORY_CARE_PROVIDER_SITE_OTHER)
Admission: RE | Admit: 2011-04-10 | Discharge: 2011-04-10 | Disposition: A | Discharge status: Home / Self Care | Payer: Medicare Other | Source: Ambulatory Visit | Attending: Internal Medicine | Admitting: Internal Medicine

## 2011-04-10 DIAGNOSIS — R2681 Unsteadiness on feet: Secondary | ICD-10-CM

## 2011-04-10 DIAGNOSIS — R279 Unspecified lack of coordination: Secondary | ICD-10-CM

## 2011-04-10 DIAGNOSIS — R270 Ataxia, unspecified: Secondary | ICD-10-CM

## 2011-04-10 DIAGNOSIS — R269 Unspecified abnormalities of gait and mobility: Secondary | ICD-10-CM

## 2011-04-14 ENCOUNTER — Ambulatory Visit (INDEPENDENT_AMBULATORY_CARE_PROVIDER_SITE_OTHER)
Admission: RE | Admit: 2011-04-14 | Discharge: 2011-04-14 | Disposition: A | Discharge status: Home / Self Care | Payer: Medicare Other | Source: Ambulatory Visit | Attending: Internal Medicine | Admitting: Internal Medicine

## 2011-04-14 DIAGNOSIS — M545 Low back pain, unspecified: Secondary | ICD-10-CM

## 2011-04-20 ENCOUNTER — Other Ambulatory Visit (INDEPENDENT_AMBULATORY_CARE_PROVIDER_SITE_OTHER): Payer: Medicare Other

## 2011-04-20 DIAGNOSIS — D649 Anemia, unspecified: Secondary | ICD-10-CM

## 2011-04-20 DIAGNOSIS — Z79899 Other long term (current) drug therapy: Secondary | ICD-10-CM

## 2011-04-20 DIAGNOSIS — E039 Hypothyroidism, unspecified: Secondary | ICD-10-CM

## 2011-04-20 DIAGNOSIS — E785 Hyperlipidemia, unspecified: Secondary | ICD-10-CM

## 2011-04-20 LAB — HEPATIC FUNCTION PANEL
ALT: 25 U/L (ref 0–53)
AST: 29 U/L (ref 0–37)
Albumin: 5 g/dL (ref 3.5–5.2)
Alkaline Phosphatase: 74 U/L (ref 39–117)
Bilirubin, Direct: 0 mg/dL (ref 0.0–0.3)
Total Bilirubin: 0.7 mg/dL (ref 0.3–1.2)
Total Protein: 7.8 g/dL (ref 6.0–8.3)

## 2011-04-20 LAB — VITAMIN B12: Vitamin B-12: 266 pg/mL (ref 211–911)

## 2011-04-20 LAB — LDL CHOLESTEROL, DIRECT: Direct LDL: 121.7 mg/dL

## 2011-04-20 LAB — LIPID PANEL
Cholesterol: 194 mg/dL (ref 0–200)
HDL: 43.1 mg/dL (ref >39.00)
Total CHOL/HDL Ratio: 5
Triglycerides: 262 mg/dL — ABNORMAL HIGH (ref 0.0–149.0)
VLDL: 52.4 mg/dL — ABNORMAL HIGH (ref 0.0–40.0)

## 2011-04-20 LAB — BASIC METABOLIC PANEL
BUN: 22 mg/dL (ref 6–23)
CO2: 30 mEq/L (ref 19–32)
Calcium: 9.4 mg/dL (ref 8.4–10.5)
Chloride: 98 mEq/L (ref 96–112)
Creatinine, Ser: 1.6 mg/dL — ABNORMAL HIGH (ref 0.4–1.5)
GFR: 45.33 mL/min — ABNORMAL LOW (ref >60.00)
Glucose, Bld: 115 mg/dL — ABNORMAL HIGH (ref 70–99)
Potassium: 3.5 mEq/L (ref 3.5–5.1)
Sodium: 141 mEq/L (ref 135–145)

## 2011-04-20 LAB — TSH: TSH: 3 u[IU]/mL (ref 0.35–5.50)

## 2011-04-27 ENCOUNTER — Other Ambulatory Visit: Payer: Self-pay | Admitting: Family Medicine

## 2011-04-27 DIAGNOSIS — E538 Deficiency of other specified B group vitamins: Secondary | ICD-10-CM

## 2011-05-04 ENCOUNTER — Telehealth: Payer: Self-pay | Admitting: Family Medicine

## 2011-05-04 NOTE — Telephone Encounter (Signed)
Refill  X 1 but pt  Sig should change to 1/2 to 1 tab.  Pt should try to take lower dose if possible due to hx of falls

## 2011-05-04 NOTE — Telephone Encounter (Signed)
Refill Hydrocodone to [facility]. Thanks.

## 2011-05-05 MED ORDER — HYDROCODONE-ACETAMINOPHEN 10-650 MG PO TABS: 0.5000 | ORAL_TABLET | Freq: Three times a day (TID) | ORAL | Status: DC | PRN

## 2011-05-05 NOTE — Telephone Encounter (Signed)
rx called in, pt aware 

## 2011-05-07 ENCOUNTER — Ambulatory Visit: Payer: Medicare Other | Admitting: Internal Medicine

## 2011-05-27 ENCOUNTER — Encounter: Payer: Self-pay | Admitting: Internal Medicine

## 2011-05-27 ENCOUNTER — Ambulatory Visit (INDEPENDENT_AMBULATORY_CARE_PROVIDER_SITE_OTHER): Payer: Medicare Other | Admitting: Internal Medicine

## 2011-05-27 VITALS — BP 154/80 | HR 80 | Temp 98.2°F | Wt 230.0 lb

## 2011-05-27 DIAGNOSIS — N189 Chronic kidney disease, unspecified: Secondary | ICD-10-CM | POA: Insufficient documentation

## 2011-05-27 DIAGNOSIS — R413 Other amnesia: Secondary | ICD-10-CM

## 2011-05-27 DIAGNOSIS — N183 Chronic kidney disease, stage 3 unspecified: Secondary | ICD-10-CM

## 2011-05-27 DIAGNOSIS — N289 Disorder of kidney and ureter, unspecified: Secondary | ICD-10-CM

## 2011-05-27 DIAGNOSIS — R269 Unspecified abnormalities of gait and mobility: Secondary | ICD-10-CM

## 2011-05-27 HISTORY — DX: Chronic kidney disease, stage 3 unspecified: N18.30

## 2011-05-27 MED ORDER — AMITRIPTYLINE HCL 25 MG PO TABS: 25.0000 mg | ORAL_TABLET | Freq: Every evening | ORAL | Status: DC | PRN

## 2011-05-27 MED ORDER — SERTRALINE HCL 100 MG PO TABS: 100.0000 mg | ORAL_TABLET | Freq: Two times a day (BID) | ORAL | Status: DC

## 2011-05-27 MED ORDER — OMEPRAZOLE 20 MG PO CPDR: 20.0000 mg | DELAYED_RELEASE_CAPSULE | Freq: Every day | ORAL | Status: DC

## 2011-05-27 MED ORDER — TRIAMTERENE-HCTZ 75-50 MG PO TABS: 1.0000 | ORAL_TABLET | Freq: Every day | ORAL | Status: DC

## 2011-05-27 MED ORDER — SIMVASTATIN 40 MG PO TABS: 40.0000 mg | ORAL_TABLET | Freq: Every day | ORAL | Status: DC

## 2011-05-27 MED ORDER — SOLIFENACIN SUCCINATE 5 MG PO TABS: 5.0000 mg | ORAL_TABLET | Freq: Every day | ORAL | Status: DC

## 2011-05-27 NOTE — Progress Notes (Signed)
Subjective:    Patient ID: Hunter [name], male    DOB: [birth year], 77 y.o.   MRN: [id number]  HPI  77 year old white male for followup. Patient previously seen for multiple falls and unsteadiness. At previous visit and we closely reviewed his medication list. It included 100 mg of amitriptyline and 10/500 mg and Norco for low back pain.  I strongly suspected that polypharmacy was constricting to patient's falls. Since then patient has decreased his amitriptyline to 50 mg and is using one half dose of Norco for low back pain. His family member reports that he has not had any recurrence of falls since previous visit.  His insomnia has not significantly worsened since lowering amitriptyline dose.  Data reviewed: CT of brain was negative for acute process. It showed mild atrophy. Lumbar x-ray was notable for mild arthritis.  Blood tests for results were also reviewed with patient. Patient has chronic mild renal insufficiency with creatinine of 1.6. He denies chronic NSAID use.  No report of previous renal insults or illness.   Review of Systems Negative for chest pain   Past Medical History  Diagnosis Date  . Hypertension   . GERD (gastroesophageal reflux disease)   . Anemia   . Depression   . Hyperlipidemia   . Vitamin D deficiency   . Chronic lower back pain   . Eczema     History   Social History  . Marital Status: Single    Spouse Name: N/A    Number of Children: N/A  . Years of Education: N/A   Occupational History  . Not on file.   Social History Main Topics  . Smoking status: Never Smoker   . Smokeless tobacco: Not on file  . Alcohol Use: No  . Drug Use: No  . Sexually Active: Not on file   Other Topics Concern  . Not on file   Social History Narrative  . No narrative on file    Past Surgical History  Procedure Date  . Rhinoplasty   . Tonsillectomy     Family History  Problem Relation Age of Onset  . Arthritis      family hx  . Hyperlipidemia     family hx  . Hypertension      family hx  . Prostate cancer      family hx  . Lung cancer Mother     Allergies  Allergen Reactions  . Sulfamethoxazole     REACTION: unspecified    Current Outpatient Prescriptions on File Prior to Visit  Medication Sig Dispense Refill  . aspirin 81 MG tablet Take 81 mg by mouth daily.        Marland Kitchen HYDROcodone-acetaminophen (LORCET) 10-650 MG per tablet Take 0.5 tablets by mouth every 8 (eight) hours as needed.  100 tablet  0    BP 154/80  Pulse 80  Temp(Src) 98.2 F (36.8 C) (Oral)  Wt 230 lb (104.327 kg)      Objective:   Physical Exam   Constitutional: Appears well-developed and well-nourished. No distress.  Head: Normocephalic and atraumatic.  Neck: Normal range of motion. Neck supple. No thyromegaly present. No carotid bruit Cardiovascular: Normal rate, regular rhythm and normal heart sounds.  Exam reveals no gallop and no friction rub.  No murmur heard. Pulmonary/Chest: Effort normal and breath sounds normal.  No wheezes. No rales.  Abdominal: Soft. Bowel sounds are normal. No mass. There is no tenderness.  Neurological: Alert. No cranial nerve deficit.  Skin: Skin is warm  and dry.  Psychiatric: Normal mood and affect. Behavior is normal.       Assessment & Plan:

## 2011-05-27 NOTE — Assessment & Plan Note (Signed)
CT of brain was negative for acute process. His instability improved with decreasing Elavil dose and using less narcotic pain medications. I suggest continuing to lower dose of amitriptyline to 25 mg.

## 2011-05-27 NOTE — Assessment & Plan Note (Signed)
77 year old white male with mild chronic renal insufficiency. His creatinine is 1.6. He denies previous history of renal disease. I suspect medical renal disease. I stressed the importance of avoiding NSAIDs and dehydration. Obtain renal ultrasound before next office visit.

## 2011-05-27 NOTE — Assessment & Plan Note (Signed)
Thyroid function is normal. B12 was low-normal. Patient advised to start over-the-counter B12 supplement.

## 2011-06-01 ENCOUNTER — Ambulatory Visit
Admission: RE | Admit: 2011-06-01 | Discharge: 2011-06-01 | Disposition: A | Discharge status: Home / Self Care | Payer: Medicare Other | Source: Ambulatory Visit | Attending: Internal Medicine | Admitting: Internal Medicine

## 2011-06-01 DIAGNOSIS — N289 Disorder of kidney and ureter, unspecified: Secondary | ICD-10-CM

## 2011-06-30 ENCOUNTER — Other Ambulatory Visit: Payer: Self-pay | Admitting: *Deleted

## 2011-06-30 NOTE — Telephone Encounter (Signed)
Refill hydrocodone 10/650 1/2-1 tabs every 8 hours prn #100.  Last filled 05/05/11

## 2011-07-01 MED ORDER — HYDROCODONE-ACETAMINOPHEN 10-650 MG PO TABS: 0.5000 | ORAL_TABLET | Freq: Three times a day (TID) | ORAL | Status: DC | PRN

## 2011-07-01 NOTE — Telephone Encounter (Signed)
Refill x 1 only.  Pt needs to make appt to discuss tapering off this pain medication within 1 month

## 2011-07-01 NOTE — Telephone Encounter (Signed)
rx called in

## 2011-07-20 ENCOUNTER — Other Ambulatory Visit (INDEPENDENT_AMBULATORY_CARE_PROVIDER_SITE_OTHER): Payer: Medicare Other

## 2011-07-20 DIAGNOSIS — I10 Essential (primary) hypertension: Secondary | ICD-10-CM

## 2011-07-20 DIAGNOSIS — E538 Deficiency of other specified B group vitamins: Secondary | ICD-10-CM

## 2011-07-20 LAB — BASIC METABOLIC PANEL
BUN: 23 mg/dL (ref 6–23)
CO2: 27 mEq/L (ref 19–32)
Calcium: 9.7 mg/dL (ref 8.4–10.5)
Chloride: 103 mEq/L (ref 96–112)
Creatinine, Ser: 1.7 mg/dL — ABNORMAL HIGH (ref 0.4–1.5)
GFR: 41.37 mL/min — ABNORMAL LOW (ref >60.00)
Glucose, Bld: 115 mg/dL — ABNORMAL HIGH (ref 70–99)
Potassium: 3.6 mEq/L (ref 3.5–5.1)
Sodium: 141 mEq/L (ref 135–145)

## 2011-07-20 LAB — VITAMIN B12: Vitamin B-12: 467 pg/mL (ref 211–911)

## 2011-07-27 ENCOUNTER — Encounter: Payer: Self-pay | Admitting: Internal Medicine

## 2011-07-27 ENCOUNTER — Ambulatory Visit (INDEPENDENT_AMBULATORY_CARE_PROVIDER_SITE_OTHER): Payer: Medicare Other | Admitting: Internal Medicine

## 2011-07-27 DIAGNOSIS — N289 Disorder of kidney and ureter, unspecified: Secondary | ICD-10-CM

## 2011-07-27 DIAGNOSIS — I10 Essential (primary) hypertension: Secondary | ICD-10-CM

## 2011-07-27 MED ORDER — TRIAMTERENE-HCTZ 37.5-25 MG PO TABS: 0.5000 | ORAL_TABLET | Freq: Every day | ORAL | Status: DC

## 2011-07-27 MED ORDER — SIMVASTATIN 40 MG PO TABS: 40.0000 mg | ORAL_TABLET | Freq: Every day | ORAL | Status: DC

## 2011-07-27 MED ORDER — SOLIFENACIN SUCCINATE 5 MG PO TABS: 5.0000 mg | ORAL_TABLET | Freq: Every day | ORAL | Status: DC

## 2011-07-27 NOTE — Assessment & Plan Note (Signed)
Renal ultrasound was normal. Mild renal insufficiency likely from medical renal disease. Decrease Maxzide dose. I reiterated the importance of avoiding over-the-counter NSAIDs and dehydration.

## 2011-07-27 NOTE — Patient Instructions (Signed)
Please complete the following lab tests before your next follow up appointment: BMET - 401.9 

## 2011-07-27 NOTE — Assessment & Plan Note (Signed)
Stable.  Decrease maxzide dose.  BP: 126/74 mmHg

## 2011-07-27 NOTE — Progress Notes (Signed)
  Subjective:    Patient ID: Hunter [name], male    DOB: Jan 27, 1934, 77 y.o.   MRN: [id number]  HPI  78 year old white male previously seen for gait instability and renal insufficiency for followup. Renal ultrasound was negative. Kidneys were normal size. His serum creatinine is stable at 1.7. He has reduced his dose of Maxzide to one half tablet as directed.  He has also reduced his dose of amitriptyline and hydrocodone. He denies falls or feeling unstable.Review of Systems He denies use of over-the-counter NSAIDs  Past Medical History  Diagnosis Date  . Hypertension   . GERD (gastroesophageal reflux disease)   . Anemia   . Depression   . Hyperlipidemia   . Vitamin D deficiency   . Chronic lower back pain   . Eczema     History   Social History  . Marital Status: Single    Spouse Name: N/A    Number of Children: N/A  . Years of Education: N/A   Occupational History  . Not on file.   Social History Main Topics  . Smoking status: Never Smoker   . Smokeless tobacco: Not on file  . Alcohol Use: No  . Drug Use: No  . Sexually Active: Not on file   Other Topics Concern  . Not on file   Social History Narrative  . No narrative on file    Past Surgical History  Procedure Date  . Rhinoplasty   . Tonsillectomy     Family History  Problem Relation Age of Onset  . Arthritis      family hx  . Hyperlipidemia      family hx  . Hypertension      family hx  . Prostate cancer      family hx  . Lung cancer Mother     Allergies  Allergen Reactions  . Sulfamethoxazole     REACTION: unspecified    Current Outpatient Prescriptions on File Prior to Visit  Medication Sig Dispense Refill  . amitriptyline (ELAVIL) 25 MG tablet Take 1 tablet (25 mg total) by mouth at bedtime as needed for sleep.  30 tablet  5  . aspirin 81 MG tablet Take 81 mg by mouth daily.        Marland Kitchen HYDROcodone-acetaminophen (LORCET) 10-650 MG per tablet Take 0.5 tablets by mouth every 8 (eight)  hours as needed.  100 tablet  0  . omeprazole (PRILOSEC) 20 MG capsule Take 1 capsule (20 mg total) by mouth daily.  90 capsule  1  . sertraline (ZOLOFT) 100 MG tablet Take 1 tablet (100 mg total) by mouth 2 (two) times daily.  60 tablet  5  . tobramycin-dexamethasone (TOBRADEX) ophthalmic ointment Place 1 application into the left eye daily as needed.          BP 126/74  Pulse 72  Temp(Src) 98.7 F (37.1 C) (Oral)  Wt 237 lb (107.502 kg)     Objective:   Physical Exam  Constitutional: He is oriented to person, place, and time. He appears well-developed and well-nourished.  Cardiovascular: Normal rate, regular rhythm and normal heart sounds.   Pulmonary/Chest: Effort normal and breath sounds normal. He has no wheezes. He has no rales.  Neurological: He is alert and oriented to person, place, and time.  Psychiatric: He has a normal mood and affect. His behavior is normal.          Assessment & Plan:

## 2011-09-18 ENCOUNTER — Other Ambulatory Visit: Payer: Self-pay | Admitting: *Deleted

## 2011-09-18 MED ORDER — HYDROCODONE-ACETAMINOPHEN 10-650 MG PO TABS: 0.5000 | ORAL_TABLET | Freq: Three times a day (TID) | ORAL | Status: DC | PRN

## 2011-09-21 ENCOUNTER — Telehealth: Payer: Self-pay | Admitting: Internal Medicine

## 2011-09-21 NOTE — Telephone Encounter (Signed)
Pt requesting refill on HYDROcodone-acetaminophen (LORCET) 10-650 MG per tablet  tobramycin-dexamethasone (TOBRADEX) ophthalmic ointment  [facility] [place]

## 2011-09-21 NOTE — Telephone Encounter (Signed)
Already done

## 2011-10-01 ENCOUNTER — Ambulatory Visit (INDEPENDENT_AMBULATORY_CARE_PROVIDER_SITE_OTHER): Payer: Medicare Other | Admitting: Internal Medicine

## 2011-10-01 ENCOUNTER — Encounter: Payer: Self-pay | Admitting: Internal Medicine

## 2011-10-01 VITALS — BP 140/80 | Temp 97.9°F | Wt 241.0 lb

## 2011-10-01 DIAGNOSIS — M545 Low back pain: Secondary | ICD-10-CM

## 2011-10-01 DIAGNOSIS — R609 Edema, unspecified: Secondary | ICD-10-CM

## 2011-10-01 DIAGNOSIS — E785 Hyperlipidemia, unspecified: Secondary | ICD-10-CM

## 2011-10-01 DIAGNOSIS — H1030 Unspecified acute conjunctivitis, unspecified eye: Secondary | ICD-10-CM

## 2011-10-01 DIAGNOSIS — N189 Chronic kidney disease, unspecified: Secondary | ICD-10-CM

## 2011-10-01 DIAGNOSIS — I10 Essential (primary) hypertension: Secondary | ICD-10-CM

## 2011-10-01 MED ORDER — FUROSEMIDE 20 MG PO TABS: 20.0000 mg | ORAL_TABLET | Freq: Every day | ORAL | Status: DC

## 2011-10-01 NOTE — Progress Notes (Signed)
  Subjective:    Patient ID: Hunter [name], male    DOB: Mar 18, 1934, 78 y.o.   MRN: [id number]  HPI  78 year old patient who is seen today with a chief complaint of pedal edema. This has been an intermittent problem over the years and he has been on higher diuretic dosing. He has been on Maxide and the dose has been down titrated. He does have chronic renal insufficiency. He had a [place]  Screen performed recently and concerns were raised about possible dementia and his ability to safely drive. Medical regimen includes 2 medications with anti-cholinergic effects. He denies any shortness of breath or history of congestive heart failure. He does have a history of depression and memory loss is listed in his problem list. He also has a significant hearing impairment. He is scheduled for hearing aids next month    Review of Systems  Constitutional: Negative for fever, chills, appetite change and fatigue.  HENT: Positive for hearing loss. Negative for ear pain, congestion, sore throat, trouble swallowing, neck stiffness, dental problem, voice change and tinnitus.   Eyes: Negative for pain, discharge and visual disturbance.  Respiratory: Negative for cough, chest tightness, wheezing and stridor.   Cardiovascular: Positive for leg swelling. Negative for chest pain and palpitations.  Gastrointestinal: Negative for nausea, vomiting, abdominal pain, diarrhea, constipation, blood in stool and abdominal distention.  Genitourinary: Negative for urgency, hematuria, flank pain, discharge, difficulty urinating and genital sores.  Musculoskeletal: Positive for back pain and gait problem. Negative for myalgias, joint swelling and arthralgias.  Skin: Negative for rash.  Neurological: Negative for dizziness, syncope, speech difficulty, weakness, numbness and headaches.  Hematological: Negative for adenopathy. Does not bruise/bleed easily.  Psychiatric/Behavioral: Negative for behavioral problems and  dysphoric mood. The patient is not nervous/anxious.        Objective:   Physical Exam  Constitutional: He is oriented to person, place, and time. He appears well-developed.       Overweight weight 241 Repeat blood pressure 120/70 O2 saturation 97%  HENT:  Head: Normocephalic.  Right Ear: External ear normal.  Left Ear: External ear normal.       Extremely hard of hearing  Eyes: Conjunctivae and EOM are normal.  Neck: Normal range of motion.  Cardiovascular: Normal rate, regular rhythm and normal heart sounds.   Pulmonary/Chest: Effort normal and breath sounds normal. No respiratory distress. He has no wheezes. He has no rales.  Abdominal: Soft. Bowel sounds are normal.  Musculoskeletal: Normal range of motion. He exhibits edema. He exhibits no tenderness.       +2 edema left foot and ankle affect is slightly more than the right  Neurological: He is alert and oriented to person, place, and time.  Psychiatric: He has a normal mood and affect. His behavior is normal.          Assessment & Plan:     Pedal edema. We'll add Lasix short-term and then when necessary. Salt restriction encouraged. We'll check in one month and followup renal indices at that time Hypertension well controlled Chronic kidney disease  Consider MMSE next visit Consider discontinuation of anti-cholinergic medications

## 2011-10-01 NOTE — Patient Instructions (Signed)
Limit your sodium (Salt) intake  Furosemide 20 mg daily  until swelling in the lower extremity is controlled then just use as necessary Return office visit one month  Keep legs elevated as much as possible

## 2011-10-13 ENCOUNTER — Other Ambulatory Visit: Payer: Self-pay | Admitting: Family Medicine

## 2011-11-02 ENCOUNTER — Ambulatory Visit (INDEPENDENT_AMBULATORY_CARE_PROVIDER_SITE_OTHER): Payer: Medicare Other | Admitting: Internal Medicine

## 2011-11-02 ENCOUNTER — Encounter: Payer: Self-pay | Admitting: Internal Medicine

## 2011-11-02 VITALS — BP 134/80 | HR 88 | Temp 98.0°F | Wt 231.0 lb

## 2011-11-02 DIAGNOSIS — G629 Polyneuropathy, unspecified: Secondary | ICD-10-CM

## 2011-11-02 DIAGNOSIS — R0989 Other specified symptoms and signs involving the circulatory and respiratory systems: Secondary | ICD-10-CM

## 2011-11-02 DIAGNOSIS — R413 Other amnesia: Secondary | ICD-10-CM

## 2011-11-02 DIAGNOSIS — G589 Mononeuropathy, unspecified: Secondary | ICD-10-CM

## 2011-11-02 HISTORY — DX: Polyneuropathy, unspecified: G62.9

## 2011-11-02 MED ORDER — AMITRIPTYLINE HCL 100 MG PO TABS: 100.0000 mg | ORAL_TABLET | Freq: Every day | ORAL | Status: DC

## 2011-11-02 MED ORDER — AMITRIPTYLINE HCL 25 MG PO TABS: 25.0000 mg | ORAL_TABLET | Freq: Every evening | ORAL | Status: DC | PRN

## 2011-11-02 NOTE — Assessment & Plan Note (Signed)
Patient has mild memory loss. Observe for now.  I do not feel patient has significant dementia that would preclude him from driving.

## 2011-11-02 NOTE — Assessment & Plan Note (Addendum)
Home care nurse has concerns about patient's driving ability. He has difficulty feeling the pedals in his car. I suspect this is related more to his neuropathy versus possible arterial insufficiency. Arrange nerve conduction study. His TSH and B12 are normal. Check RPR.  Continue amitriptyline.  I suggest PT evaluation re: patient physical ability to operate a motor vehicle safely.

## 2011-11-02 NOTE — Progress Notes (Signed)
Subjective:    Patient ID: Hunter [name], male    DOB: 29-Aug-1933, 78 y.o.   MRN: [id number]  HPI  78 year old white male with history of deep instability and renal insufficiency for followup. Since previous visit patient was evaluated by health care home care nurse. There was concern for possible memory loss. There is also concerned he may not be safe to drive. He has difficulty feeling the peddles with his feet.  Nursing staff was concerned about possible arterial insufficiency.  His family members have not noticed significant change in his memory. He is still able to perform routine calculations. He manages his own finances.    He does have significant hearing loss.  Patient has not been able to afford hearing aids.  Review of Systems  Chronic intermittent insomnia, occasional back pain  Past Medical History  Diagnosis Date  . Hypertension   . GERD (gastroesophageal reflux disease)   . Anemia   . Depression   . Hyperlipidemia   . Vitamin d deficiency   . Chronic lower back pain   . Eczema     History   Social History  . Marital Status: Single    Spouse Name: N/A    Number of Children: N/A  . Years of Education: N/A   Occupational History  . Not on file.   Social History Main Topics  . Smoking status: Never Smoker   . Smokeless tobacco: Not on file  . Alcohol Use: No  . Drug Use: No  . Sexually Active: Not on file   Other Topics Concern  . Not on file   Social History Narrative  . No narrative on file    Past Surgical History  Procedure Date  . Rhinoplasty   . Tonsillectomy     Family History  Problem Relation Age of Onset  . Arthritis      family hx  . Hyperlipidemia      family hx  . Hypertension      family hx  . Prostate cancer      family hx  . Lung cancer Mother     Allergies  Allergen Reactions  . Sulfamethoxazole     REACTION: unspecified    Current Outpatient Prescriptions on File Prior to Visit  Medication Sig Dispense Refill   . aspirin 81 MG tablet Take 81 mg by mouth daily.        . cyanocobalamin 1000 MCG tablet Take 100 mcg by mouth daily.      . furosemide (LASIX) 20 MG tablet Take 1 tablet (20 mg total) by mouth daily.  30 tablet  3  . HYDROcodone-acetaminophen (LORCET) 10-650 MG per tablet Take 0.5 tablets by mouth every 8 (eight) hours as needed.  90 tablet  0  . omeprazole (PRILOSEC) 20 MG capsule Take 1 capsule (20 mg total) by mouth daily.  90 capsule  1  . sertraline (ZOLOFT) 100 MG tablet Take 1 tablet (100 mg total) by mouth 2 (two) times daily.  60 tablet  5  . simvastatin (ZOCOR) 40 MG tablet Take 1 tablet (40 mg total) by mouth at bedtime. NEW DIRECTIONS, 1 HS  90 tablet  1  . solifenacin (VESICARE) 5 MG tablet Take 1 tablet (5 mg total) by mouth daily.  90 tablet  1  . tobramycin-dexamethasone (TOBRADEX) ophthalmic ointment Place 1 application into the left eye daily as needed.        . triamterene-hydrochlorothiazide (MAXZIDE-25) 37.5-25 MG per tablet Take 0.5 each (0.5 tablets  total) by mouth daily.  90 tablet  1    BP 134/80  Pulse 88  Temp(Src) 98 F (36.7 C) (Oral)  Wt 231 lb (104.781 kg)       Objective:   Physical Exam  Constitutional: He is oriented to person, place, and time. He appears well-developed and well-nourished.  HENT:  Head: Normocephalic and atraumatic.       Gross hearing loss  Cardiovascular: Normal rate and regular rhythm.  Exam reveals no gallop.        Diminished lower extremity pulses.  Pulmonary/Chest: Effort normal and breath sounds normal.  Neurological: He is alert and oriented to person, place, and time. No cranial nerve deficit.       Poor vibratory and temperature sense in his lower extremities.  Skin: Skin is warm and dry.  Psychiatric:       Patient alert and oriented x3. Able to provide year, month, and date.  Recalled 2 out of 3 meds.  Able to count backwards from 100 by 7.  Clock drawing normal.  Able to draw intersecting pentagons.         Assessment & Plan:

## 2011-11-02 NOTE — Patient Instructions (Addendum)
Please complete the following lab tests before your next follow up appointment: BMET - 401.9 RPR - 780.93 HgA1c - 790.29

## 2011-11-04 ENCOUNTER — Other Ambulatory Visit: Payer: Self-pay | Admitting: Internal Medicine

## 2011-11-04 DIAGNOSIS — R0989 Other specified symptoms and signs involving the circulatory and respiratory systems: Secondary | ICD-10-CM

## 2011-11-10 ENCOUNTER — Encounter (INDEPENDENT_AMBULATORY_CARE_PROVIDER_SITE_OTHER): Payer: Medicare Other

## 2011-11-10 DIAGNOSIS — I739 Peripheral vascular disease, unspecified: Secondary | ICD-10-CM

## 2011-11-10 DIAGNOSIS — R0989 Other specified symptoms and signs involving the circulatory and respiratory systems: Secondary | ICD-10-CM

## 2011-11-13 ENCOUNTER — Other Ambulatory Visit: Payer: Self-pay | Admitting: *Deleted

## 2011-11-13 MED ORDER — HYDROCODONE-ACETAMINOPHEN 10-650 MG PO TABS: 0.5000 | ORAL_TABLET | Freq: Three times a day (TID) | ORAL | Status: DC | PRN

## 2011-11-30 ENCOUNTER — Ambulatory Visit (INDEPENDENT_AMBULATORY_CARE_PROVIDER_SITE_OTHER): Payer: Medicare Other | Admitting: Internal Medicine

## 2011-11-30 ENCOUNTER — Encounter: Payer: Self-pay | Admitting: Internal Medicine

## 2011-11-30 VITALS — BP 122/72 | HR 92 | Temp 97.0°F | Wt 230.0 lb

## 2011-11-30 DIAGNOSIS — E785 Hyperlipidemia, unspecified: Secondary | ICD-10-CM

## 2011-11-30 DIAGNOSIS — R413 Other amnesia: Secondary | ICD-10-CM

## 2011-11-30 DIAGNOSIS — G629 Polyneuropathy, unspecified: Secondary | ICD-10-CM

## 2011-11-30 DIAGNOSIS — G589 Mononeuropathy, unspecified: Secondary | ICD-10-CM

## 2011-11-30 LAB — HEPATIC FUNCTION PANEL
ALT: 20 U/L (ref 0–53)
AST: 24 U/L (ref 0–37)
Albumin: 4.6 g/dL (ref 3.5–5.2)
Alkaline Phosphatase: 62 U/L (ref 39–117)
Bilirubin, Direct: 0 mg/dL (ref 0.0–0.3)
Total Bilirubin: 0.8 mg/dL (ref 0.3–1.2)
Total Protein: 7.2 g/dL (ref 6.0–8.3)

## 2011-11-30 LAB — LIPID PANEL
Cholesterol: 172 mg/dL (ref 0–200)
HDL: 31 mg/dL — ABNORMAL LOW (ref >39.00)
Total CHOL/HDL Ratio: 6
Triglycerides: 326 mg/dL — ABNORMAL HIGH (ref 0.0–149.0)
VLDL: 65.2 mg/dL — ABNORMAL HIGH (ref 0.0–40.0)

## 2011-11-30 NOTE — Assessment & Plan Note (Signed)
I suspect his family is concerned about his memory  because of his hearing loss.  Patient encouraged to follow up with audiologist for possible hearing aid.

## 2011-11-30 NOTE — Patient Instructions (Signed)
Please make sure you have completed your referrals and nerve conduction testing before your next follow up appointment.

## 2011-11-30 NOTE — Assessment & Plan Note (Signed)
Patient has PVD in his feet.  TBI  Right (.48)  Left (.53).  Continue statin and aspirin therapy.  Goal LDL < 100.

## 2011-11-30 NOTE — Assessment & Plan Note (Signed)
Patient denies any difficulty with sensing gas or brake pedal.  Arrange nerve conduction study.

## 2011-11-30 NOTE — Progress Notes (Signed)
Subjective:    Patient ID: Hunter [name], male    DOB: [birth year], 78 y.o.   MRN: [id number]  HPI  78 year old white male with history of hypertension and possible neuropathy for followup. His daughter-in-law and son raised the question whether patient can safely operate a motor vehicle at previous office visit. He felt he could not feel the pedals with his feet. Home nurse questioned whether there was a possibility of peripheral vascular disease.   Arterial Doppler of his lower extremities were performed. His ABI was normal but his TBI are abnormal but adequate for tissue healing.  Patient was referred to physical therapy to determine whether he can safely operate a motor vehicle but this was not completed.  Results of arterial Doppler were reviewed with the patient. History of hyperlipidemia. He reports taking simvastatin on a regular basis. He report following a fairly healthy diet.   Review of Systems Negative for chest pain, he reports no history of MVAs, He denies any difficulty with feeling pedals in a vehicle  Past Medical History  Diagnosis Date  . Hypertension   . GERD (gastroesophageal reflux disease)   . Anemia   . Depression   . Hyperlipidemia   . Vitamin d deficiency   . Chronic lower back pain   . Eczema     History   Social History  . Marital Status: Single    Spouse Name: N/A    Number of Children: N/A  . Years of Education: N/A   Occupational History  . Not on file.   Social History Main Topics  . Smoking status: Never Smoker   . Smokeless tobacco: Not on file  . Alcohol Use: No  . Drug Use: No  . Sexually Active: Not on file   Other Topics Concern  . Not on file   Social History Narrative  . No narrative on file    Past Surgical History  Procedure Date  . Rhinoplasty   . Tonsillectomy     Family History  Problem Relation Age of Onset  . Arthritis      family hx  . Hyperlipidemia      family hx  . Hypertension      family hx  .  Prostate cancer      family hx  . Lung cancer Mother     Allergies  Allergen Reactions  . Sulfamethoxazole     REACTION: unspecified    Current Outpatient Prescriptions on File Prior to Visit  Medication Sig Dispense Refill  . amitriptyline (ELAVIL) 25 MG tablet Take 1 tablet (25 mg total) by mouth at bedtime as needed for sleep.  30 tablet  5  . aspirin 81 MG tablet Take 81 mg by mouth daily.        . cyanocobalamin 1000 MCG tablet Take 100 mcg by mouth daily.      . furosemide (LASIX) 20 MG tablet Take 1 tablet (20 mg total) by mouth daily.  30 tablet  3  . HYDROcodone-acetaminophen (LORCET) 10-650 MG per tablet Take 0.5 tablets by mouth every 8 (eight) hours as needed.  90 tablet  1  . omeprazole (PRILOSEC) 20 MG capsule Take 1 capsule (20 mg total) by mouth daily.  90 capsule  1  . sertraline (ZOLOFT) 100 MG tablet Take 1 tablet (100 mg total) by mouth 2 (two) times daily.  60 tablet  5  . simvastatin (ZOCOR) 40 MG tablet Take 1 tablet (40 mg total) by mouth at bedtime. NEW DIRECTIONS,  1 HS  90 tablet  1  . solifenacin (VESICARE) 5 MG tablet Take 1 tablet (5 mg total) by mouth daily.  90 tablet  1  . tobramycin-dexamethasone (TOBRADEX) ophthalmic ointment Place 1 application into the left eye daily as needed.        . triamterene-hydrochlorothiazide (MAXZIDE-25) 37.5-25 MG per tablet Take 0.5 each (0.5 tablets total) by mouth daily.  90 tablet  1    BP 122/72  Pulse 92  Temp(Src) 97 F (36.1 C) (Oral)  Wt 230 lb (104.327 kg)  SpO2 95%       Objective:   Physical Exam  Constitutional: He appears well-developed and well-nourished.  HENT:       Gross hearing loss but able to converse normally  Neck:       No carotid bruit  Cardiovascular: Normal rate and regular rhythm.   Pulmonary/Chest: Effort normal and breath sounds normal.  Musculoskeletal:       Mild lower extremity edema left greater than right  Neurological:       Patient able to sense vibration in both feet    Skin: Skin is warm and dry.  Psychiatric: He has a normal mood and affect. His behavior is normal.          Assessment & Plan:

## 2011-12-01 LAB — LDL CHOLESTEROL, DIRECT: Direct LDL: 100.2 mg/dL

## 2011-12-21 ENCOUNTER — Other Ambulatory Visit: Payer: Self-pay | Admitting: Family Medicine

## 2012-01-04 ENCOUNTER — Ambulatory Visit: Payer: Medicare Other | Admitting: Internal Medicine

## 2012-03-01 ENCOUNTER — Other Ambulatory Visit: Payer: Self-pay | Admitting: Urology

## 2012-03-24 ENCOUNTER — Encounter (HOSPITAL_BASED_OUTPATIENT_CLINIC_OR_DEPARTMENT_OTHER): Payer: Self-pay | Admitting: *Deleted

## 2012-03-25 ENCOUNTER — Encounter (HOSPITAL_BASED_OUTPATIENT_CLINIC_OR_DEPARTMENT_OTHER): Payer: Self-pay | Admitting: *Deleted

## 2012-03-25 NOTE — Progress Notes (Addendum)
SPOKE W/ DAUGHTER-N-LAW , Hunter [name]. PT UNAVAILABLE , HE IS AT HIS BROTHER'S UNTIL Sunday. MEDICATIONS NEED TO BE VERIFIED, LAST REVEIWED AT DR YOO OFFICE, INSTRUCTIONS GIVEN FOR PT TO BRING LIST. VERIFY MEDICAL / SURG HX W/ PT. INSTRUCTIONS GIVEN NPO AFTER MN . ARRIVE AT 0700. NEEDS ISTAT AND EKG. AND DO HIBICLENS SHOWER AM OF SURG. PT CALLED BACK.  INSTRUCTIONS GIVEN , NPO AFTER MN. MEDICATION AND HX VERIFIED. WILL ARRIVE AT 0730.

## 2012-03-28 ENCOUNTER — Ambulatory Visit (HOSPITAL_BASED_OUTPATIENT_CLINIC_OR_DEPARTMENT_OTHER): Payer: Medicare Other | Admitting: Anesthesiology

## 2012-03-28 ENCOUNTER — Encounter (HOSPITAL_BASED_OUTPATIENT_CLINIC_OR_DEPARTMENT_OTHER): Payer: Self-pay | Admitting: Anesthesiology

## 2012-03-28 ENCOUNTER — Encounter (HOSPITAL_BASED_OUTPATIENT_CLINIC_OR_DEPARTMENT_OTHER)
Admission: RE | Disposition: A | Discharge status: Home / Self Care | Payer: Self-pay | Source: Ambulatory Visit | Attending: Urology

## 2012-03-28 ENCOUNTER — Ambulatory Visit (HOSPITAL_BASED_OUTPATIENT_CLINIC_OR_DEPARTMENT_OTHER)
Admission: RE | Admit: 2012-03-28 | Discharge: 2012-03-28 | Disposition: A | Discharge status: Home / Self Care | Payer: Medicare Other | Source: Ambulatory Visit | Attending: Urology | Admitting: Urology

## 2012-03-28 ENCOUNTER — Encounter (HOSPITAL_BASED_OUTPATIENT_CLINIC_OR_DEPARTMENT_OTHER): Payer: Self-pay | Admitting: *Deleted

## 2012-03-28 DIAGNOSIS — C609 Malignant neoplasm of penis, unspecified: Secondary | ICD-10-CM | POA: Insufficient documentation

## 2012-03-28 DIAGNOSIS — I10 Essential (primary) hypertension: Secondary | ICD-10-CM | POA: Insufficient documentation

## 2012-03-28 DIAGNOSIS — K219 Gastro-esophageal reflux disease without esophagitis: Secondary | ICD-10-CM | POA: Insufficient documentation

## 2012-03-28 DIAGNOSIS — Z79899 Other long term (current) drug therapy: Secondary | ICD-10-CM | POA: Insufficient documentation

## 2012-03-28 DIAGNOSIS — R310 Gross hematuria: Secondary | ICD-10-CM | POA: Insufficient documentation

## 2012-03-28 DIAGNOSIS — E7800 Pure hypercholesterolemia, unspecified: Secondary | ICD-10-CM | POA: Insufficient documentation

## 2012-03-28 DIAGNOSIS — R32 Unspecified urinary incontinence: Secondary | ICD-10-CM | POA: Insufficient documentation

## 2012-03-28 DIAGNOSIS — N400 Enlarged prostate without lower urinary tract symptoms: Secondary | ICD-10-CM | POA: Insufficient documentation

## 2012-03-28 HISTORY — DX: Peripheral vascular disease, unspecified: I73.9

## 2012-03-28 HISTORY — DX: Nocturia: R35.1

## 2012-03-28 HISTORY — DX: Unspecified osteoarthritis, unspecified site: M19.90

## 2012-03-28 HISTORY — DX: Left bundle-branch block, unspecified: I44.7

## 2012-03-28 HISTORY — DX: Unspecified hearing loss, bilateral: H91.93

## 2012-03-28 HISTORY — PX: CYSTOSCOPY WITH URETHRAL DILATATION: SHX5125

## 2012-03-28 LAB — POCT I-STAT 4, (NA,K, GLUC, HGB,HCT)
Glucose, Bld: 115 mg/dL — ABNORMAL HIGH (ref 70–99)
HCT: 44 % (ref 39.0–52.0)
Hemoglobin: 15 g/dL (ref 13.0–17.0)
Potassium: 3.5 mEq/L (ref 3.5–5.1)
Sodium: 141 mEq/L (ref 135–145)

## 2012-03-28 SURGERY — BIOPSY, PENIS
Anesthesia: General | Site: Penis | Wound class: Clean

## 2012-03-28 MED ORDER — KETOROLAC TROMETHAMINE 30 MG/ML IJ SOLN: 15.0000 mg | Freq: Once | INTRAMUSCULAR | Status: DC | PRN

## 2012-03-28 MED ORDER — ACETAMINOPHEN 650 MG RE SUPP: 650.0000 mg | RECTAL | Status: DC | PRN

## 2012-03-28 MED ORDER — CEFAZOLIN SODIUM 1-5 GM-% IV SOLN: 1.0000 g | INTRAVENOUS | Status: DC

## 2012-03-28 MED ORDER — LACTATED RINGERS IV SOLN
INTRAVENOUS | Status: DC | PRN
  Administered 2012-03-28 (×2): via INTRAVENOUS

## 2012-03-28 MED ORDER — PROMETHAZINE HCL 25 MG/ML IJ SOLN: 6.2500 mg | INTRAMUSCULAR | Status: DC | PRN

## 2012-03-28 MED ORDER — SODIUM CHLORIDE 0.9 % IJ SOLN: 3.0000 mL | INTRAMUSCULAR | Status: DC | PRN

## 2012-03-28 MED ORDER — SODIUM CHLORIDE 0.45 % IV SOLN: INTRAVENOUS | Status: DC

## 2012-03-28 MED ORDER — DEXAMETHASONE SODIUM PHOSPHATE 4 MG/ML IJ SOLN
INTRAMUSCULAR | Status: DC | PRN
  Administered 2012-03-28: 10 mg via INTRAVENOUS

## 2012-03-28 MED ORDER — ACETAMINOPHEN 325 MG PO TABS: 650.0000 mg | ORAL_TABLET | ORAL | Status: DC | PRN

## 2012-03-28 MED ORDER — CEFAZOLIN SODIUM-DEXTROSE 2-3 GM-% IV SOLR
INTRAVENOUS | Status: DC | PRN
  Administered 2012-03-28: 2 g via INTRAVENOUS

## 2012-03-28 MED ORDER — PROPOFOL 10 MG/ML IV BOLUS
INTRAVENOUS | Status: DC | PRN
  Administered 2012-03-28: 200 mg via INTRAVENOUS

## 2012-03-28 MED ORDER — ACETAMINOPHEN 10 MG/ML IV SOLN
1000.0000 mg | Freq: Four times a day (QID) | INTRAVENOUS | Status: DC
  Administered 2012-03-28: 1000 mg via INTRAVENOUS

## 2012-03-28 MED ORDER — HYDROCODONE-ACETAMINOPHEN 7.5-650 MG PO TABS: 1.0000 | ORAL_TABLET | Freq: Four times a day (QID) | ORAL | Status: DC | PRN

## 2012-03-28 MED ORDER — FENTANYL CITRATE 0.05 MG/ML IJ SOLN
INTRAMUSCULAR | Status: DC | PRN
  Administered 2012-03-28 (×2): 25 ug via INTRAVENOUS
  Administered 2012-03-28: 100 ug via INTRAVENOUS
  Administered 2012-03-28: 50 ug via INTRAVENOUS

## 2012-03-28 MED ORDER — ONDANSETRON HCL 4 MG/2ML IJ SOLN: 4.0000 mg | Freq: Four times a day (QID) | INTRAMUSCULAR | Status: DC | PRN

## 2012-03-28 MED ORDER — FENTANYL CITRATE 0.05 MG/ML IJ SOLN: 25.0000 ug | INTRAMUSCULAR | Status: DC | PRN

## 2012-03-28 MED ORDER — CEPHALEXIN 500 MG PO CAPS: 500.0000 mg | ORAL_CAPSULE | Freq: Two times a day (BID) | ORAL | Status: DC

## 2012-03-28 MED ORDER — OXYCODONE HCL 5 MG PO TABS: 5.0000 mg | ORAL_TABLET | ORAL | Status: DC | PRN

## 2012-03-28 MED ORDER — ONDANSETRON HCL 4 MG/2ML IJ SOLN
INTRAMUSCULAR | Status: DC | PRN
  Administered 2012-03-28: 4 mg via INTRAVENOUS

## 2012-03-28 MED ORDER — LACTATED RINGERS IV SOLN
INTRAVENOUS | Status: DC
  Administered 2012-03-28: 08:00:00 via INTRAVENOUS

## 2012-03-28 MED ORDER — KETOROLAC TROMETHAMINE 30 MG/ML IJ SOLN
INTRAMUSCULAR | Status: DC | PRN
  Administered 2012-03-28: 30 mg via INTRAVENOUS

## 2012-03-28 MED ORDER — CEFAZOLIN SODIUM-DEXTROSE 2-3 GM-% IV SOLR: 2.0000 g | INTRAVENOUS | Status: DC

## 2012-03-28 MED ORDER — SODIUM CHLORIDE 0.9 % IV SOLN: 250.0000 mL | INTRAVENOUS | Status: DC | PRN

## 2012-03-28 MED ORDER — STERILE WATER FOR IRRIGATION IR SOLN
Status: DC | PRN
  Administered 2012-03-28: 3000 mL

## 2012-03-28 MED ORDER — SODIUM CHLORIDE 0.9 % IJ SOLN: 3.0000 mL | Freq: Two times a day (BID) | INTRAMUSCULAR | Status: DC

## 2012-03-28 MED ORDER — LIDOCAINE HCL (CARDIAC) 20 MG/ML IV SOLN
INTRAVENOUS | Status: DC | PRN
  Administered 2012-03-28: 100 mg via INTRAVENOUS

## 2012-03-28 MED ORDER — CHLORHEXIDINE GLUCONATE 4 % EX LIQD: Freq: Once | CUTANEOUS | Status: DC

## 2012-03-28 SURGICAL SUPPLY — 58 items
ADAPTER CATH URET PLST 4-6FR (CATHETERS) IMPLANT
ADPR CATH URET STRL DISP 4-6FR (CATHETERS)
BAG DRAIN URO-CYSTO SKYTR STRL (DRAIN) ×3 IMPLANT
BAG URINE LEG 500ML (DRAIN) ×3 IMPLANT
BALLN NEPHROSTOMY (BALLOONS)
BALLOON NEPHROSTOMY (BALLOONS) IMPLANT
BANDAGE CO FLEX L/F 2IN X 5YD (GAUZE/BANDAGES/DRESSINGS) ×3 IMPLANT
BANDAGE COBAN STERILE 2 (GAUZE/BANDAGES/DRESSINGS) ×3 IMPLANT
BLADE SURG 15 STRL LF DISP TIS (BLADE) ×2 IMPLANT
BLADE SURG 15 STRL SS (BLADE) ×2
BLADE SURG ROTATE 9660 (MISCELLANEOUS) ×3 IMPLANT
BOOTIES KNEE HIGH SLOAN (MISCELLANEOUS) ×3 IMPLANT
CANISTER SUCT LVC 12 LTR MEDI- (MISCELLANEOUS) IMPLANT
CATH FOLEY 2W COUNCIL 20FR 5CC (CATHETERS) IMPLANT
CATH FOLEY 2WAY SLVR  5CC 16FR (CATHETERS) ×1
CATH FOLEY 2WAY SLVR 5CC 16FR (CATHETERS) ×2 IMPLANT
CATH INTERMIT  6FR 70CM (CATHETERS) IMPLANT
CATH URET 5FR 28IN CONE TIP (BALLOONS)
CATH URET 5FR 28IN OPEN ENDED (CATHETERS) IMPLANT
CATH URET 5FR 70CM CONE TIP (BALLOONS) IMPLANT
CLOTH BEACON ORANGE TIMEOUT ST (SAFETY) ×3 IMPLANT
COVER MAYO STAND STRL (DRAPES) ×3 IMPLANT
COVER TABLE BACK 60X90 (DRAPES) ×3 IMPLANT
DERMABOND ADVANCED (GAUZE/BANDAGES/DRESSINGS)
DERMABOND ADVANCED .7 DNX12 (GAUZE/BANDAGES/DRESSINGS) IMPLANT
DRAPE CAMERA CLOSED 9X96 (DRAPES) ×3 IMPLANT
DRAPE PED LAPAROTOMY (DRAPES) ×3 IMPLANT
DRESSING TELFA 8X3 (GAUZE/BANDAGES/DRESSINGS) ×3 IMPLANT
ELECT NEEDLE TIP 2.8 STRL (NEEDLE) ×3 IMPLANT
ELECT REM PT RETURN 9FT ADLT (ELECTROSURGICAL) ×3
ELECTRODE REM PT RTRN 9FT ADLT (ELECTROSURGICAL) ×2 IMPLANT
GLOVE BIO SURGEON STRL SZ7.5 (GLOVE) ×6 IMPLANT
GLOVE BIOGEL PI IND STRL 6.5 (GLOVE) ×2 IMPLANT
GLOVE BIOGEL PI IND STRL 7.0 (GLOVE) ×2 IMPLANT
GLOVE BIOGEL PI INDICATOR 6.5 (GLOVE) ×1
GLOVE BIOGEL PI INDICATOR 7.0 (GLOVE) ×1
GOWN PREVENTION PLUS LG XLONG (DISPOSABLE) ×3 IMPLANT
GOWN PREVENTION PLUS XLARGE (GOWN DISPOSABLE) ×3 IMPLANT
GOWN STRL REIN XL XLG (GOWN DISPOSABLE) ×3 IMPLANT
GOWN XL W/COTTON TOWEL STD (GOWNS) IMPLANT
GUIDEWIRE 0.038 PTFE COATED (WIRE) IMPLANT
GUIDEWIRE ANG ZIPWIRE 038X150 (WIRE) IMPLANT
GUIDEWIRE STR DUAL SENSOR (WIRE) ×3 IMPLANT
NEEDLE HYPO 25X5/8 SAFETYGLIDE (NEEDLE) ×3 IMPLANT
NS IRRIG 500ML POUR BTL (IV SOLUTION) ×3 IMPLANT
PACK BASIN DAY SURGERY FS (CUSTOM PROCEDURE TRAY) ×3 IMPLANT
PACK CYSTOSCOPY (CUSTOM PROCEDURE TRAY) ×3 IMPLANT
PENCIL BUTTON HOLSTER BLD 10FT (ELECTRODE) ×3 IMPLANT
SPONGE GAUZE 4X4 12PLY (GAUZE/BANDAGES/DRESSINGS) ×3 IMPLANT
SUT MON AB 4-0 PC3 18 (SUTURE) ×6 IMPLANT
SYR CONTROL 10ML LL (SYRINGE) ×3 IMPLANT
SYRINGE 10CC LL (SYRINGE) ×3 IMPLANT
SYRINGE IRR TOOMEY STRL 70CC (SYRINGE) IMPLANT
TOWEL NATURAL 6PK STERILE (DISPOSABLE) ×6 IMPLANT
TOWEL OR 17X24 6PK STRL BLUE (TOWEL DISPOSABLE) ×6 IMPLANT
TRAY DSU PREP LF (CUSTOM PROCEDURE TRAY) ×3 IMPLANT
WATER STERILE IRR 3000ML UROMA (IV SOLUTION) ×3 IMPLANT
WATER STERILE IRR 500ML POUR (IV SOLUTION) ×3 IMPLANT

## 2012-03-28 NOTE — Anesthesia Procedure Notes (Signed)
Procedure Name: LMA Insertion Date/Time: 03/28/2012 9:12 AM Performed by: Jessica Priest Pre-anesthesia Checklist: Patient identified, Emergency Drugs available, Suction available and Patient being monitored Patient Re-evaluated:Patient Re-evaluated prior to inductionOxygen Delivery Method: Circle System Utilized Preoxygenation: Pre-oxygenation with 100% oxygen Intubation Type: IV induction Ventilation: Mask ventilation without difficulty LMA: LMA with gastric port inserted LMA Size: 5.0 Number of attempts: 1 Airway Equipment and Method: bite block Placement Confirmation: positive ETCO2 Tube secured with: Tape Dental Injury: Teeth and Oropharynx as per pre-operative assessment

## 2012-03-28 NOTE — H&P (Signed)
Chief Complaint  cc: Dr. Thomos Lemons, [place] [facility]   Reason For Visit  Yearly f/u   Active Problems Problems  1. Gross Hematuria 599.71 2. Male Erectile Disorder Due To Physical Condition 607.84 3. Neoplasm Of The Glans Penis 187.2 4. Squamous Cell Carcinoma In Situ Of The Penis 233.5 5. 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  He states today that he has a lesion at the meatal opening causing some difficulty voiding.  He had gross hematuria at the beginning of his flow about 2 weeks ago.  He has urgency, intermittant, weak flow & nocturia x 3-4.  Today's IPSS = 15-16/7.  He is s/p circumcision and partial resection of the penile glans on 06/02/10.      He is sexually inactive, and has a hx of herpes type-2, taking antiviral meds (when needed).  He also takes Vesicare 5mg  for successful Rx of overactive bladder symptoms.       Originally referred by Dr. Abner Greenspan for evaluation of a penile lesion, with hx of a penile rash, thought to be a fungus, but resistrant to creams.   Past Medical History Problems  1. History of  Anxiety (Symptom) 300.00 2. History of  Depression 311 3. History of  Esophageal Reflux 530.81 4. History of  Hypercholesterolemia 272.0 5. History of  Hypertension 401.9 6. History of  Skin Cancer V10.83  Surgical History Problems  1. History of  Biopsy Penis Cutaneous 2. History of  Hernia Repair 3. History of  Orthopedic Surgery 4. History of  Surgery Penis Destruction Of Lesion(S) Extensive  Current Meds 1. Amitriptyline HCl 25 MG Oral Tablet; Therapy: 24Feb2011 to 2. Aspirin 81 MG Oral Tablet; Therapy: (Recorded:19Oct2011) to 3. Bactroban 2 % External Cream; Therapy: (Recorded:19Oct2011) to 4. Ketoconazole 2 % External Cream; Therapy: (Recorded:19Oct2011) to 5. Lorcet 10/650 TABS; Therapy:  (Recorded:19Oct2011) to 6. PriLOSEC 20 MG Oral Capsule Delayed Release; Therapy: (Recorded:19Oct2011) to 7. Sertraline HCl 25 MG Oral Tablet; Therapy: (Recorded:15Aug2013) to 8. Simvastatin 80 MG Oral Tablet; Therapy: (Recorded:19Oct2011) to 9. Tobramycin Sulfate 0.3 % Ophthalmic Solution; Therapy: (Recorded:19Oct2011) to 10. Triamterene-HCTZ 75-50 MG Oral Tablet; Therapy: (Recorded:19Oct2011) to 11. VESIcare 5 MG Oral Tablet; Therapy: (Recorded:19Oct2011) to  Allergies Medication  1. No Known Drug Allergies  Family History Problems  1. Paternal history of  Family Health Status Number Of Children 2 sons 2. Paternal history of  Father Deceased At Age ____ 63, heart disease 3. Paternal history of  Heart Disease V17.49 4. Maternal history of  Lung Cancer V16.1 5. Paternal history of  Mother Deceased At Age ____ 62, lung cancer  Social History Problems  1. Caffeine Use 2-3 per wk 2. Marital History - Divorced V61.03 3. Never A Smoker 4. Occupation: Retired Nurse, children's  5. History of  Alcohol Use 6. History of  Tobacco Use  Review of Systems Genitourinary, constitutional, skin, eye, otolaryngeal, hematologic/lymphatic, cardiovascular, pulmonary, endocrine, musculoskeletal, gastrointestinal, neurological and psychiatric system(s) were reviewed and pertinent findings if present are noted.  Genitourinary: feelings of urinary urgency, nocturia, incontinence, weak urinary stream, urinary stream starts and stops, hematuria and penile lesion.    Vitals Vital Signs [Data Includes: Last 1 Day]  15Aug2013 01:43PM  BMI Calculated: 31.5 BSA Calculated: 2.21 Height: 5 ft 11 in Weight: 225 lb  Blood Pressure: 161 /  74 Heart Rate: 87  Physical Exam Genitourinary: Examination of the penis demonstrates tenderness on palpation, meatal stenosis and redness and irritation of the meatus, but no swelling, no discharge, no masses and no adherence of the prepuce. The penis is circumcised. Penile  lesion: A single 2 cm tender, but not exudative, non-indurated and non-malodorous ulcer(s) noted. The scrotum is normal in appearance. The right testis is palpably normal. The left testis is normal.  Lymphatics: The femoral and inguinal nodes are not enlarged or tender.  Skin: Normal skin turgor, no visible rash and no visible skin lesions.  Neuro/Psych:. Mood and affect are appropriate.    Results/Data Urine [Data Includes: Last 1 Day]   15Aug2013  COLOR YELLOW   APPEARANCE CLEAR   SPECIFIC GRAVITY 1.020   pH 6.0   GLUCOSE NEG mg/dL  BILIRUBIN NEG   KETONE NEG mg/dL  BLOOD NEG   PROTEIN NEG mg/dL  UROBILINOGEN 0.2 mg/dL  NITRITE NEG   LEUKOCYTE ESTERASE NEG    Assessment Assessed  1. Gross Hematuria 599.71 2. Squamous Cell Carcinoma In Situ Of The Penis 233.5   78 yo male with hx of Sq Cell Ca-In-Situ of the glans in2011. He now has recent gross hematuria, and a lesion on the glans in thesame area as his CIS, measuring 2cm and involving the urethral meatus. Needs CT, and bx, and Ho laser of lesion.   Plan Gross Hematuria (599.71)  1. AU CT-HEMATURIA PROTOCOL  Requested for: 15Aug2013 2. CREATININE with eGFR  Requested for: 15Aug2013   1. Cr/gfr, and CT hematuria protocol 2. penile bx and Holmuim laser of glans, cystoscopy and urethral dilation.   Signatures Electronically signed by : Jethro Bolus, M.D.; Feb 25 2012  2:24PM He reports new onset difficulty voiding with the sensation of obstruction at the urethral opening. He wil need cystoscopy, and possible biopsy, and possible dilation also. Discussed with him the AM of surgery.

## 2012-03-28 NOTE — Op Note (Signed)
Pre-operative diagnosis : Carcinoma the penile meatus and glans  Postoperative diagnosis: Same  Operation: Excisional biopsy carcinoma of the penile glans and meatus, laser vaporization of excised carcinoma the base, meatoplasty, cystoscopy.  Surgeon:  [name]. Patsi Sears, MD  First assistant: None  Anesthesia:  general  Preparation: After appropriate preanesthesia, the patient was brought to the operating room, and placed on the operating table in the dorsal supine position where general LMA anesthesia was introduced. The penis was then prepped with Betadine solution and draped in usual fashion. The armband was rechecked. Timeout was observed.  Review history:77 yo divorced male returns today for yearly f/u with a hx of squamous cell carcinoma in situ of the penis, ED & incontinence. He states today that he has a lesion at the meatal opening causing some difficulty voiding. He had gross hematuria at the beginning of his flow about 2 weeks ago. He has urgency, intermittant, weak flow & nocturia x 3-4. Today's IPSS = 15-16/7. He is s/p circumcision and partial resection of the penile glans on 06/02/10.  He is sexually inactive, and has a hx of herpes type-2, taking antiviral meds (when needed). He also takes Vesicare 5mg  for successful Rx of overactive bladder symptoms.  Originally referred by Dr. Abner Greenspan for evaluation of a penile lesion, with hx of a penile rash, thought to be a fungus, but resistrant to creams.      Statement of  Likelihood of Success: Excellent. TIME-OUT observed.:  Procedure: Examination of the penis revealed a circumcised male. The penile glans showed a horseshoe shaped erythematous area, with demarcated borders on the penile shaft shaft. The erythematous area extended onto the glans around the meatus bilaterally. On the left side of the meatus, there was a firm hard nodule within the erythematous area.  After consideration of simple biopsy and lasing of the area, I outlined  excisional biopsy, and elected to proceed with an excisional biopsy which included a heart-shaped excision involving the lateral portion of the meatus bilaterally, from the 12:00 to the 6 clock positions, and then extending over the glans bilaterally, and meeting at the 6:00 position. This heart-shaped excisional biopsy was accomplished using sharp dissection, pin- point Bovie, and then the Yag laser, to gain better vascular control, and for more penetrating cancer treatment.   Following this, a meatal plasty was performed, using 5-0 Monocryl suture, and U-shaped sutures were placed, with care taken to place the multi-knotted sutures on the outside of the incision. Carefully, size 16, 18, and 20 dilators were placed and the urethra, without resistance. Flexible cystoscopy was accomplished, which showed enlarged prostate, and median lobe, but no evidence of bladder tumor, or urethral stricture, or other meatal abnormality. Although the patient has a history of GU viral disease, there was no evidence of condyloma.  I elected to place a 46 French Foley catheter, and this was placed atraumatically. Antibiotic one was placed, and sterile dressing was placed. The patient was awakened and taken to recovery room in good condition.

## 2012-03-28 NOTE — Anesthesia Preprocedure Evaluation (Addendum)
Anesthesia Evaluation  Patient identified by MRN, date of birth, ID band Patient awake    Reviewed: Allergy & Precautions, H&P , NPO status , Patient's Chart, lab work & pertinent test results  Airway Mallampati: II TM Distance: <3 FB Neck ROM: Limited    Dental No notable dental hx.    Pulmonary neg pulmonary ROS,  breath sounds clear to auscultation  Pulmonary exam normal       Cardiovascular hypertension, Pt. on medications Rhythm:Regular Rate:Normal     Neuro/Psych negative neurological ROS  negative psych ROS   GI/Hepatic Neg liver ROS, GERD-  Medicated,  Endo/Other  Hypothyroidism   Renal/GU negative Renal ROS  negative genitourinary   Musculoskeletal negative musculoskeletal ROS (+)   Abdominal   Peds negative pediatric ROS (+)  Hematology negative hematology ROS (+)   Anesthesia Other Findings   Reproductive/Obstetrics negative OB ROS                          Anesthesia Physical Anesthesia Plan  ASA: III  Anesthesia Plan: General   Post-op Pain Management:    Induction: Intravenous  Airway Management Planned: LMA  Additional Equipment:   Intra-op Plan:   Post-operative Plan:   Informed Consent: I have reviewed the patients History and Physical, chart, labs and discussed the procedure including the risks, benefits and alternatives for the proposed anesthesia with the patient or authorized representative who has indicated his/her understanding and acceptance.   Dental advisory given  Plan Discussed with: CRNA and Surgeon  Anesthesia Plan Comments:         Anesthesia Quick Evaluation

## 2012-03-28 NOTE — Transfer of Care (Signed)
Immediate Anesthesia Transfer of Care Note  Patient: Hunter [name]  Procedure(s) Performed: Procedure(s) (LRB): PENILE BIOPSY (N/A) HOLMIUM LASER APPLICATION (N/A) CYSTOSCOPY WITH URETHRAL DILATATION (N/A)  Patient Location: PACU  Anesthesia Type: General  Level of Consciousness: awake, sedated, patient cooperative and responds to stimulation  Airway & Oxygen Therapy: Patient Spontanous Breathing and Patient connected to face mask oxygen  Post-op Assessment: Report given to PACU RN, Post -op Vital signs reviewed and stable and Patient moving all extremities  Post vital signs: Reviewed and stable  Complications: No apparent anesthesia complications

## 2012-03-28 NOTE — Interval H&P Note (Signed)
History and Physical Interval Note:  03/28/2012 8:57 AM  Hunter [name]  has presented today for surgery, with the diagnosis of Squamous Cell Carcinoma in Situ of Penis  The various methods of treatment have been discussed with the patient and family. After consideration of risks, benefits and other options for treatment, the patient has consented to  Procedure(s) (LRB) with comments: PENILE BIOPSY (N/A) - 1 hour requested for this case  BIOPSY PENILE GLANS HOLMIUM LASER APPLICATION (N/A) - HOLMIUM LASER OF GLANS AND PENIS CYSTOSCOPY WITH URETHRAL DILATATION (N/A) - FLEX CYSTOSCOPY as a surgical intervention .  The patient's history has been reviewed, patient examined, no change in status, stable for surgery.  I have reviewed the patient's chart and labs.  Questions were answered to the patient's satisfaction.     Jethro Bolus I

## 2012-03-29 ENCOUNTER — Encounter (HOSPITAL_BASED_OUTPATIENT_CLINIC_OR_DEPARTMENT_OTHER): Payer: Self-pay | Admitting: Urology

## 2012-03-29 NOTE — Anesthesia Postprocedure Evaluation (Signed)
  Anesthesia Post-op Note  Patient: Hunter [name]  Procedure(s) Performed: Procedure(s) (LRB): PENILE BIOPSY (N/A) HOLMIUM LASER APPLICATION (N/A) CYSTOSCOPY WITH URETHRAL DILATATION (N/A)  Patient Location: PACU  Anesthesia Type: General  Level of Consciousness: awake and alert   Airway and Oxygen Therapy: Patient Spontanous Breathing  Post-op Pain: mild  Post-op Assessment: Post-op Vital signs reviewed, Patient's Cardiovascular Status Stable, Respiratory Function Stable, Patent Airway and No signs of Nausea or vomiting  Post-op Vital Signs: stable  Complications: No apparent anesthesia complications

## 2012-04-29 ENCOUNTER — Other Ambulatory Visit: Payer: Self-pay | Admitting: Internal Medicine

## 2012-05-17 ENCOUNTER — Other Ambulatory Visit (INDEPENDENT_AMBULATORY_CARE_PROVIDER_SITE_OTHER): Payer: Medicare Other

## 2012-05-17 DIAGNOSIS — Z79899 Other long term (current) drug therapy: Secondary | ICD-10-CM

## 2012-05-17 DIAGNOSIS — Z125 Encounter for screening for malignant neoplasm of prostate: Secondary | ICD-10-CM

## 2012-05-17 DIAGNOSIS — Z0000 Encounter for general adult medical examination without abnormal findings: Secondary | ICD-10-CM

## 2012-05-17 LAB — BASIC METABOLIC PANEL
BUN: 24 mg/dL — ABNORMAL HIGH (ref 6–23)
CO2: 27 mEq/L (ref 19–32)
Calcium: 9.8 mg/dL (ref 8.4–10.5)
Chloride: 101 mEq/L (ref 96–112)
Creatinine, Ser: 1.3 mg/dL (ref 0.4–1.5)
GFR: 56.52 mL/min — ABNORMAL LOW (ref >60.00)
Glucose, Bld: 108 mg/dL — ABNORMAL HIGH (ref 70–99)
Potassium: 4.5 mEq/L (ref 3.5–5.1)
Sodium: 139 mEq/L (ref 135–145)

## 2012-05-17 LAB — CBC WITH DIFFERENTIAL/PLATELET
Basophils Absolute: 0 10*3/uL (ref 0.0–0.1)
Basophils Relative: 0.3 % (ref 0.0–3.0)
Eosinophils Absolute: 0.1 10*3/uL (ref 0.0–0.7)
Eosinophils Relative: 1.3 % (ref 0.0–5.0)
HCT: 48.9 % (ref 39.0–52.0)
Hemoglobin: 16.1 g/dL (ref 13.0–17.0)
Lymphocytes Relative: 26.6 % (ref 12.0–46.0)
Lymphs Abs: 2.1 10*3/uL (ref 0.7–4.0)
MCHC: 32.9 g/dL (ref 30.0–36.0)
MCV: 78.9 fl (ref 78.0–100.0)
Monocytes Absolute: 0.6 10*3/uL (ref 0.1–1.0)
Monocytes Relative: 7.7 % (ref 3.0–12.0)
Neutro Abs: 5 10*3/uL (ref 1.4–7.7)
Neutrophils Relative %: 64.1 % (ref 43.0–77.0)
Platelets: 108 10*3/uL — ABNORMAL LOW (ref 150.0–400.0)
RBC: 6.2 Mil/uL — ABNORMAL HIGH (ref 4.22–5.81)
RDW: 14.7 % — ABNORMAL HIGH (ref 11.5–14.6)
WBC: 7.9 10*3/uL (ref 4.5–10.5)

## 2012-05-17 LAB — LIPID PANEL
Cholesterol: 187 mg/dL (ref 0–200)
HDL: 32.1 mg/dL — ABNORMAL LOW (ref >39.00)
Total CHOL/HDL Ratio: 6
Triglycerides: 215 mg/dL — ABNORMAL HIGH (ref 0.0–149.0)
VLDL: 43 mg/dL — ABNORMAL HIGH (ref 0.0–40.0)

## 2012-05-17 LAB — POCT URINALYSIS DIPSTICK
Bilirubin, UA: NEGATIVE
Blood, UA: NEGATIVE
Glucose, UA: NEGATIVE
Ketones, UA: NEGATIVE
Leukocytes, UA: NEGATIVE
Nitrite, UA: NEGATIVE
Protein, UA: NEGATIVE
Spec Grav, UA: 1.025
Urobilinogen, UA: 0.2
pH, UA: 6

## 2012-05-17 LAB — HEPATIC FUNCTION PANEL
ALT: 21 U/L (ref 0–53)
AST: 25 U/L (ref 0–37)
Albumin: 4.4 g/dL (ref 3.5–5.2)
Alkaline Phosphatase: 55 U/L (ref 39–117)
Bilirubin, Direct: 0.1 mg/dL (ref 0.0–0.3)
Total Bilirubin: 0.6 mg/dL (ref 0.3–1.2)
Total Protein: 6.8 g/dL (ref 6.0–8.3)

## 2012-05-17 LAB — TSH: TSH: 3.21 u[IU]/mL (ref 0.35–5.50)

## 2012-05-17 LAB — LDL CHOLESTEROL, DIRECT: Direct LDL: 128.3 mg/dL

## 2012-05-17 LAB — PSA: PSA: 3.31 ng/mL (ref 0.10–4.00)

## 2012-05-19 ENCOUNTER — Other Ambulatory Visit: Payer: Self-pay | Admitting: *Deleted

## 2012-05-19 MED ORDER — TRIAMTERENE-HCTZ 37.5-25 MG PO TABS: 1.0000 | ORAL_TABLET | Freq: Every day | ORAL | Status: DC

## 2012-05-23 ENCOUNTER — Ambulatory Visit (INDEPENDENT_AMBULATORY_CARE_PROVIDER_SITE_OTHER): Payer: Medicare Other | Admitting: Internal Medicine

## 2012-05-23 ENCOUNTER — Encounter: Payer: Self-pay | Admitting: Internal Medicine

## 2012-05-23 VITALS — BP 138/88 | HR 103 | Temp 97.9°F | Ht 70.5 in | Wt 235.0 lb

## 2012-05-23 DIAGNOSIS — I10 Essential (primary) hypertension: Secondary | ICD-10-CM

## 2012-05-23 DIAGNOSIS — D696 Thrombocytopenia, unspecified: Secondary | ICD-10-CM

## 2012-05-23 DIAGNOSIS — Z2911 Encounter for prophylactic immunotherapy for respiratory syncytial virus (RSV): Secondary | ICD-10-CM

## 2012-05-23 DIAGNOSIS — R413 Other amnesia: Secondary | ICD-10-CM

## 2012-05-23 DIAGNOSIS — D409 Neoplasm of uncertain behavior of male genital organ, unspecified: Secondary | ICD-10-CM

## 2012-05-23 DIAGNOSIS — Z23 Encounter for immunization: Secondary | ICD-10-CM

## 2012-05-23 HISTORY — DX: Thrombocytopenia, unspecified: D69.6

## 2012-05-23 MED ORDER — SOLIFENACIN SUCCINATE 5 MG PO TABS: 5.0000 mg | ORAL_TABLET | Freq: Every day | ORAL | Status: DC

## 2012-05-23 MED ORDER — TRIAMTERENE-HCTZ 37.5-25 MG PO TABS: 1.0000 | ORAL_TABLET | Freq: Every day | ORAL | Status: DC

## 2012-05-23 MED ORDER — SIMVASTATIN 40 MG PO TABS: 40.0000 mg | ORAL_TABLET | Freq: Every day | ORAL | Status: DC

## 2012-05-23 MED ORDER — HYDROCODONE-ACETAMINOPHEN 10-650 MG PO TABS: 0.5000 | ORAL_TABLET | Freq: Three times a day (TID) | ORAL | Status: DC | PRN

## 2012-05-23 MED ORDER — AMITRIPTYLINE HCL 25 MG PO TABS: 25.0000 mg | ORAL_TABLET | Freq: Every evening | ORAL | Status: DC | PRN

## 2012-05-23 MED ORDER — SERTRALINE HCL 100 MG PO TABS: 100.0000 mg | ORAL_TABLET | Freq: Two times a day (BID) | ORAL | Status: DC

## 2012-05-23 MED ORDER — FUROSEMIDE 20 MG PO TABS: 20.0000 mg | ORAL_TABLET | Freq: Every day | ORAL | Status: DC

## 2012-05-23 NOTE — Assessment & Plan Note (Signed)
S/P excision by urology.  Pathology showed squamous cell carcinoma. Patient understands to follow up with urologist.

## 2012-05-23 NOTE — Progress Notes (Signed)
Subjective:    Patient ID: Hunter [name], male    DOB: 06-15-1934, 78 y.o.   MRN: [id number]  HPI  78 year old white male with history of hyperlipidemia, hypertension, chronic insomnia for routine followup. Overall, patient reports he's been doing well. He denies any history of falls. He denies any issues with depression. His brother is having significant health issues and he is currently residing with his brother in [place], [place].  Hypertension-stable  He was seen by urologists for penile lesion. Patient underwent excision. He has followup appointment with urologist.  Lab results were reviewed in detail with patient. He has chronic mild thrombocytopenia. He denies any unusual bleeding or bruising.  Review of Systems  Constitutional: Negative for activity change, appetite change and unexpected weight change.  Eyes: Negative for visual disturbance.  Respiratory: Negative for cough, chest tightness and shortness of breath.   Cardiovascular: Negative for chest pain.  Genitourinary: Negative for difficulty urinating.  Neurological: Negative for headaches.  Gastrointestinal: Negative for abdominal pain, heartburn melena or hematochezia Psych: Negative for depression or anxiety   Past Medical History  Diagnosis Date  . Hypertension   . Anemia   . Depression   . Hyperlipidemia   . Chronic lower back pain   . Eczema   . LBBB (left bundle branch block)   . Peripheral vascular disease LOWER EXTREMITIES  . 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 Arthritis   . Nocturia   . GERD (gastroesophageal reflux disease) occasional  . Hearing loss of both ears     History   Social History  . Marital Status: Single    Spouse Name: N/A    Number of Children: N/A  . Years of Education: N/A   Occupational History  . Not on file.   Social History Main Topics  . Smoking status: Never Smoker   . Smokeless tobacco: Never Used  . Alcohol Use: No  . Drug Use: No  .  Sexually Active: Not on file   Other Topics Concern  . Not on file   Social History Narrative  . No narrative on file    Past Surgical History  Procedure Date  . Tonsillectomy   . Hernia repair 1970'S  . Excision right wrist benign tumor 2002 (APPROX)  . Incision and drainage deep neck abscess   . Penile bx 05-09-2010  . Circumcision/ laser dissection penile glans cancer 06-02-2010  . Cystoscopy with urethral dilatation 03/28/2012    Procedure: CYSTOSCOPY WITH URETHRAL DILATATION;  Surgeon: Kathi Ludwig, MD;  Location: [facility];  Service: Urology;  Laterality: N/A;  excision biopsy extensive meatal penile carcinoma meatoplasty    Family History  Problem Relation Age of Onset  . Arthritis      family hx  . Hyperlipidemia      family hx  . Hypertension      family hx  . Prostate cancer      family hx  . Lung cancer Mother     No Known Allergies  Current Outpatient Prescriptions on File Prior to Visit  Medication Sig Dispense Refill  . aspirin 81 MG tablet Take 81 mg by mouth daily.       . cyanocobalamin 1000 MCG tablet Take 100 mcg by mouth daily.      Marland Kitchen omeprazole (PRILOSEC) 20 MG capsule Take 20 mg by mouth as needed.      . tobramycin-dexamethasone (TOBRADEX) ophthalmic ointment Place 1 application into the left eye daily  as needed.       . [DISCONTINUED] furosemide (LASIX) 20 MG tablet Take 1 tablet (20 mg total) by mouth daily.  30 tablet  3  . [DISCONTINUED] sertraline (ZOLOFT) 100 MG tablet Take 1 tablet (100 mg total) by mouth 2 (two) times daily.  60 tablet  5  . [DISCONTINUED] simvastatin (ZOCOR) 40 MG tablet Take 1 tablet (40 mg total) by mouth at bedtime. NEW DIRECTIONS, 1 HS  90 tablet  1  . [DISCONTINUED] triamterene-hydrochlorothiazide (MAXZIDE-25) 37.5-25 MG per tablet Take 1 each (1 tablet total) by mouth daily.  90 tablet  1  . [DISCONTINUED] VESICARE 5 MG tablet TAKE ONE TABLET BY MOUTH EVERY DAY  90 each  1    BP 138/88   Pulse 103  Temp 97.9 F (36.6 C) (Oral)  Ht 5' 10.5" (1.791 m)  Wt 235 lb (106.595 kg)  BMI 33.24 kg/m2  SpO2 98%       Objective:   Physical Exam  Constitutional: He is oriented to person, place, and time. He appears well-developed and well-nourished.  HENT:  Head: Normocephalic and atraumatic.  Right Ear: External ear normal.  Left Ear: External ear normal.  Mouth/Throat: Oropharynx is clear and moist.       Bilateral hearing aids  Neck: Neck supple.  Cardiovascular: Normal rate, regular rhythm and normal heart sounds.   Pulmonary/Chest: Effort normal and breath sounds normal. He has no wheezes.  Abdominal: Soft. Bowel sounds are normal.       No organomegaly  Genitourinary: Rectum normal and prostate normal. Guaiac negative stool.  Lymphadenopathy:    He has no cervical adenopathy.  Neurological: He is oriented to person, place, and time. No cranial nerve deficit.  Skin: Skin is warm and dry.  Psychiatric: He has a normal mood and affect. His behavior is normal.          Assessment & Plan:

## 2012-05-23 NOTE — Assessment & Plan Note (Signed)
78 year old white male has mild thrombocytopenia of unclear etiology. This has been chronic issue - > 3 yrs. Refer to hematology for further evaluation.

## 2012-05-23 NOTE — Assessment & Plan Note (Signed)
Stable.  No change in medication regimen.  BP: 138/88 mmHg  Lab Results  Component Value Date   NA 139 05/17/2012   K 4.5 05/17/2012   CL 101 05/17/2012   CO2 27 05/17/2012   Lab Results  Component Value Date   CREATININE 1.3 05/17/2012

## 2012-05-23 NOTE — Assessment & Plan Note (Signed)
No change 

## 2012-05-24 ENCOUNTER — Telehealth: Payer: Self-pay | Admitting: Oncology

## 2012-05-24 NOTE — Telephone Encounter (Signed)
C/D 05/24/12 for appt.06/15/12

## 2012-05-24 NOTE — Telephone Encounter (Signed)
S/W Chauncey Fischer in re NP appt 12/04 @ 1:30 w/Dr. Cyndie Chime.  Referring Dr. Artist Pais Dx-Thrombocytopenia Welcome packet mailed.

## 2012-05-29 ENCOUNTER — Other Ambulatory Visit: Payer: Self-pay | Admitting: Oncology

## 2012-05-29 DIAGNOSIS — D696 Thrombocytopenia, unspecified: Secondary | ICD-10-CM

## 2012-06-15 ENCOUNTER — Encounter: Payer: Self-pay | Admitting: Oncology

## 2012-06-15 ENCOUNTER — Ambulatory Visit (HOSPITAL_BASED_OUTPATIENT_CLINIC_OR_DEPARTMENT_OTHER): Payer: Medicare Other

## 2012-06-15 ENCOUNTER — Other Ambulatory Visit (HOSPITAL_BASED_OUTPATIENT_CLINIC_OR_DEPARTMENT_OTHER): Payer: Medicare Other | Admitting: Lab

## 2012-06-15 ENCOUNTER — Ambulatory Visit (HOSPITAL_BASED_OUTPATIENT_CLINIC_OR_DEPARTMENT_OTHER): Payer: Medicare Other | Admitting: Oncology

## 2012-06-15 VITALS — BP 170/77 | HR 80 | Temp 98.1°F | Resp 18 | Ht 70.0 in | Wt 239.4 lb

## 2012-06-15 DIAGNOSIS — D074 Carcinoma in situ of penis: Secondary | ICD-10-CM

## 2012-06-15 DIAGNOSIS — D696 Thrombocytopenia, unspecified: Secondary | ICD-10-CM

## 2012-06-15 LAB — CBC WITH DIFFERENTIAL/PLATELET
BASO%: 0.2 % (ref 0.0–2.0)
Basophils Absolute: 0 10*3/uL (ref 0.0–0.1)
EOS%: 1.2 % (ref 0.0–7.0)
Eosinophils Absolute: 0.1 10*3/uL (ref 0.0–0.5)
HCT: 46.9 % (ref 38.4–49.9)
HGB: 16 g/dL (ref 13.0–17.1)
LYMPH%: 24.4 % (ref 14.0–49.0)
MCH: 26.3 pg — ABNORMAL LOW (ref 27.2–33.4)
MCHC: 34.1 g/dL (ref 32.0–36.0)
MCV: 77.3 fL — ABNORMAL LOW (ref 79.3–98.0)
MONO#: 0.7 10*3/uL (ref 0.1–0.9)
MONO%: 8.2 % (ref 0.0–14.0)
NEUT#: 5.8 10*3/uL (ref 1.5–6.5)
NEUT%: 66 % (ref 39.0–75.0)
Platelets: 118 10*3/uL — ABNORMAL LOW (ref 140–400)
RBC: 6.07 10*6/uL — ABNORMAL HIGH (ref 4.20–5.82)
RDW: 14.5 % (ref 11.0–14.6)
WBC: 8.8 10*3/uL (ref 4.0–10.3)
lymph#: 2.1 10*3/uL (ref 0.9–3.3)

## 2012-06-15 LAB — MORPHOLOGY
PLT EST: DECREASED
RBC Comments: NORMAL
White Cell Comments: 6

## 2012-06-15 LAB — CHCC SMEAR

## 2012-06-15 NOTE — Progress Notes (Signed)
New Patient Hematology-Oncology Evaluation   Hunter [name] [id number] [birth year] 78 y.o. 06/15/2012  CC: Dr. Arby Barrette; Dr. Harlin Heys   Reason for referral: Mild thrombocytopenia   HPI: Pleasant 78 year old man who is been in overall good health. He was found to have squamous cell carcinoma in situ of the glans penis in November 2011 and was treated with circumcision and partial resection.Marland Kitchen He presented again with gross hematuria in September of this year. He underwent a second surgical procedure with an excisional biopsy of the glans and meatus as well as laser vaporization at the base of the surgical excision.on September 16. Pathology showed squamous cell carcinoma in situ with focal margin involvement but no invasion.  Lab studies done 3 years ago on 08/15/2008 showed a slight decrease in his platelet count of 124,000 with no anemia and a normal white count and white count differential. Counts have changed little over the years. On 05/17/2012 hemoglobin 16 hematocrit 49 white count 7900 and platelet count 108,000. In our office today hemoglobin 16 hematocrit 47 MCV 77 white count 8866 neutrophils 24 lymphocytes 8 monocytes , platelet count 118,000.  He used to drink moderately in the past but none for the last 15 years. He is a never smoker. He worked in Airline pilot. He has no history of exposure to any toxic chemicals, organic solvents, or radiation. He is on  medications for depression including low dose Elavil 25 mg at bedtime when necessary and Zoloft 100 mg daily. He has no signs or symptoms of a collagen vascular disease. He has some degenerative arthritis. He has not noted any increased bleeding or bruising. He has not had any hematuria since recent surgery. He has no history of any liver disease and denies history of hepatitis or yellow jaundice.   PMH: Past Medical History  Diagnosis Date  . Hypertension   . Anemia   . Depression   . Hyperlipidemia   . Chronic lower back  pain   . Eczema   . LBBB (left bundle branch block)   . Peripheral vascular disease LOWER EXTREMITIES  . 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 Arthritis   . Nocturia   . GERD (gastroesophageal reflux disease) occasional  . Hearing loss of both ears     Past Surgical History  Procedure Date  . Tonsillectomy   . Hernia repair 1970'S  . Excision right wrist benign tumor 2002 (APPROX)  . Incision and drainage deep neck abscess   . Penile bx 05-09-2010  . Circumcision/ laser dissection penile glans cancer 06-02-2010  . Cystoscopy with urethral dilatation 03/28/2012    Procedure: CYSTOSCOPY WITH URETHRAL DILATATION;  Surgeon: Kathi Ludwig, MD;  Location: [facility];  Service: Urology;  Laterality: N/A;  excision biopsy extensive meatal penile carcinoma meatoplasty    Allergies: No Known Allergies  Medications: Elavil 25 mg at bedtime when necessary, aspirin 81 mg daily, B12 1000 mcg by mouth daily, Lasix 20 mg by mouth daily, Lorcet 10-650 one half tablet q. 8 hours when necessary pain, Prilosec 20 mg daily when necessary, Zoloft 100 mg by mouth twice a day, Zocor 40 mg daily, VESIcare at 5 mg 1-2 daily, Maxzide 37.5-25 one daily   Social History: See history of present illness. He is divorced. He has 2 sons. One is healthy age 14 accompanies him today. The other has health problems diabetes and alcohol abuse. He worked in Airline pilot.   Family History: Family History  Problem Relation Age  of Onset  . Arthritis      family hx  . Hyperlipidemia      family hx  . Hypertension      family hx  . Prostate cancer      family hx  . Lung cancer Mother     Review of Systems: Constitutional symptoms: No constitutional symptoms he gets positive exercise HEENT: No sore throat Respiratory: No cough or dyspnea  Cardiovascular: No chest pain or palpitations  Gastrointestinal ROS: No change in bowel habits, no melena or hematochezia, Genito-Urinary ROS: See  above Hematological and Lymphatic: Musculoskeletal: Some arthritis pain Neurologic: No headache or change in vision Dermatologic: Chronic dermatitis. History of removal of a number of skin cancers from his face. Remaining ROS negative.  Physical Exam: Blood pressure 170/77, pulse 80, temperature 98.1 F (36.7 C), temperature source Oral, resp. rate 18, height 5\' 10"  (1.778 m), weight 239 lb 6.4 oz (108.591 kg). Wt Readings from Last 3 Encounters:  06/15/12 239 lb 6.4 oz (108.591 kg)  05/23/12 235 lb (106.595 kg)  03/28/12 232 lb (105.235 kg)    General appearance: A jovial loquacious Caucasian man Head: Normal Neck: Normal Lymph nodes: No adenopathy Breasts: Lungs: Clear to auscultation resonant to percussion Heart: Regular rhythm no murmur Abdominal: Soft, nontender, no mass, no organomegaly GU: Glans penis with signs of previous surgery but no palpable mass. No testicular mass. No inguinal adenopathy. Extremities: Trace edema Neurologic: He is hard of hearing. Otherwise no focal deficit Skin: Chronic dermatitis face and chest    Lab Results: Lab Results  Component Value Date   WBC 8.8 06/15/2012   HGB 16.0 06/15/2012   HCT 46.9 06/15/2012   MCV 77.3* 06/15/2012   PLT 118* 06/15/2012     Chemistry      Component Value Date/Time   NA 139 05/17/2012 0933   K 4.5 05/17/2012 0933   CL 101 05/17/2012 0933   CO2 27 05/17/2012 0933   BUN 24* 05/17/2012 0933   CREATININE 1.3 05/17/2012 0933      Component Value Date/Time   CALCIUM 9.8 05/17/2012 0933   ALKPHOS 55 05/17/2012 0933   AST 25 05/17/2012 0933   ALT 21 05/17/2012 0933   BILITOT 0.6 05/17/2012 0933       Review of peripheral blood film: Normochromic normocytic red cells, mature white cells both neutrophils and lymphocytes with occasional benign reactive lymphocytes. Platelets appear normal in number and morphology. There are some large platelets     Impression and Plan: #1. Borderline decreased platelet count  which is consistently over 100,000 and stable over 4 years of observation. No associated anemia or leukopenia. Subpopulation of large platelets on review of the peripheral blood film which may spuriously lowere the machine count. Recommendation: Observation alone. At his current platelet count he can have any kind of surgery that is deemed necessary for his penile cancer if that recurs.  #2. Recurrent squamous cell carcinoma in situ of the penis. Status post second surgical procedure and laser ablation. Not clear that anything else needs to be done at this time other than observation by his urologist. He understands that if the cancer becomes invasive that he will need a partial resection of his penis. I'm not sure if there is any role for radiation at this point. He had a  positive surgical margin but no invasive disease. He is currently voiding without difficulty and is not having any hematuria.      Levert Feinstein, MD 06/15/2012, 3:17 PM

## 2012-06-15 NOTE — Progress Notes (Signed)
Checked in new patient. No financial issues. °

## 2012-06-16 ENCOUNTER — Telehealth: Payer: Self-pay | Admitting: Oncology

## 2012-06-16 NOTE — Telephone Encounter (Signed)
Per 12/4 pof return PRN.

## 2012-07-15 ENCOUNTER — Other Ambulatory Visit: Payer: Self-pay | Admitting: *Deleted

## 2012-07-15 MED ORDER — HYDROCODONE-ACETAMINOPHEN 5-325 MG PO TABS: 1.0000 | ORAL_TABLET | Freq: Three times a day (TID) | ORAL | Status: DC | PRN

## 2012-10-17 ENCOUNTER — Encounter: Payer: Self-pay | Admitting: Internal Medicine

## 2012-10-17 ENCOUNTER — Ambulatory Visit (INDEPENDENT_AMBULATORY_CARE_PROVIDER_SITE_OTHER): Payer: Medicare Other | Admitting: Internal Medicine

## 2012-10-17 VITALS — BP 138/68 | Temp 97.9°F | Wt 233.0 lb

## 2012-10-17 DIAGNOSIS — J209 Acute bronchitis, unspecified: Secondary | ICD-10-CM | POA: Insufficient documentation

## 2012-10-17 MED ORDER — DOXYCYCLINE HYCLATE 100 MG PO TABS: 100.0000 mg | ORAL_TABLET | Freq: Two times a day (BID) | ORAL | Status: DC

## 2012-10-17 MED ORDER — MOMETASONE FURO-FORMOTEROL FUM 200-5 MCG/ACT IN AERO: 2.0000 | INHALATION_SPRAY | Freq: Two times a day (BID) | RESPIRATORY_TRACT | Status: DC

## 2012-10-17 NOTE — Progress Notes (Signed)
Subjective:    Patient ID: Hunter [name], male    DOB: 05/24/1934, 79 y.o.   MRN: [id number]  HPI  79 year old white male with history of hypertension, mild memory loss and renal insufficiency complains of cough and congestion for 1 week. Patient reports associated wheezing. He reports cough is nonproductive. He self feverish but no documented temperature.  Review of Systems Negative for shortness of breath  Past Medical History  Diagnosis Date  . Hypertension   . Anemia   . Depression   . Hyperlipidemia   . Chronic lower back pain   . Eczema   . LBBB (left bundle branch block)   . Peripheral vascular disease LOWER EXTREMITIES  . 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 Arthritis   . Nocturia   . GERD (gastroesophageal reflux disease) occasional  . Hearing loss of both ears   . 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 Marital Status: Single    Spouse Name: N/A    Number of Children: N/A  . Years of Education: N/A   Occupational History  . Not on file.   Social History Main Topics  . Smoking status: Never Smoker   . Smokeless tobacco: Never Used  . Alcohol Use: No  . Drug Use: No  . Sexually Active: Not on file   Other Topics Concern  . Not on file   Social History Narrative  . No narrative on file    Past Surgical History  Procedure Laterality Date  . Tonsillectomy    . Hernia repair  1970'S  . Excision right wrist benign tumor  2002 (APPROX)  . Incision and drainage deep neck abscess    . Penile bx  05-09-2010  . Circumcision/ laser dissection penile glans cancer  06-02-2010  . Cystoscopy with urethral dilatation  03/28/2012    Procedure: CYSTOSCOPY WITH URETHRAL DILATATION;  Surgeon: Kathi Ludwig, MD;  Location: [facility];  Service: Urology;  Laterality: N/A;  excision biopsy extensive meatal penile  carcinoma meatoplasty    Family History  Problem Relation Age of Onset  . Arthritis      family hx  . Hyperlipidemia      family hx  . Hypertension      family hx  . Prostate cancer      family hx  . Lung cancer Mother     No Known Allergies  Current Outpatient Prescriptions on File Prior to Visit  Medication Sig Dispense Refill  . amitriptyline (ELAVIL) 25 MG tablet Take 1 tablet (25 mg total) by mouth at bedtime as needed for sleep.  90 tablet  1  . aspirin 81 MG tablet Take 81 mg by mouth daily.       . cyanocobalamin 1000 MCG tablet Take 100 mcg by mouth daily.      . furosemide (LASIX) 20 MG tablet Take 1 tablet (20 mg total) by mouth daily.  90 tablet  1  . HYDROcodone-acetaminophen (NORCO/VICODIN) 5-325 MG per tablet Take 1 tablet by mouth every 8 (eight) hours as needed for pain.  30 tablet  2  . omeprazole (PRILOSEC) 20 MG capsule Take 20 mg by mouth as needed.      . sertraline (ZOLOFT) 100 MG tablet Take 1 tablet (100 mg total) by mouth 2 (two) times daily.  60 tablet  5  .  simvastatin (ZOCOR) 40 MG tablet Take 1 tablet (40 mg total) by mouth at bedtime. NEW DIRECTIONS, 1 HS  90 tablet  1  . solifenacin (VESICARE) 5 MG tablet Take 5 mg by mouth daily. Pt takes 5-10 mg daily      . tobramycin-dexamethasone (TOBRADEX) ophthalmic ointment Place 1 application into the left eye daily as needed.       . triamterene-hydrochlorothiazide (MAXZIDE-25) 37.5-25 MG per tablet Take 1 each (1 tablet total) by mouth daily.  90 tablet  1  . [DISCONTINUED] VESICARE 5 MG tablet TAKE ONE TABLET BY MOUTH EVERY DAY  90 each  1   No current facility-administered medications on file prior to visit.    BP 138/68  Temp(Src) 97.9 F (36.6 C) (Oral)  Wt 233 lb (105.688 kg)  BMI 33.43 kg/m2  SpO2 99%       Objective:   Physical Exam  Constitutional: He is oriented to person, place, and time. He appears well-developed and well-nourished.  HENT:  Head: Normocephalic and atraumatic.   Right Ear: External ear normal.  Left Ear: External ear normal.  Mouth/Throat: No oropharyngeal exudate.  Oropharyngeal erythema  Cardiovascular: Normal rate, regular rhythm and normal heart sounds.   Pulmonary/Chest: Effort normal. He has no rales.  Scattered expiratory wheeze bilaterally  Neurological: He is alert and oriented to person, place, and time.  Skin: Skin is warm and dry.  Psychiatric: He has a normal mood and affect. His behavior is normal.          Assessment & Plan:

## 2012-10-17 NOTE — Assessment & Plan Note (Signed)
79 year old white male presents with signs and symptoms of acute bronchitis. Treat with doxycycline 100 mg twice daily for 10 days. Sample of [facility] provided. Patient to use 2 puffs twice daily as directed. Patient advised to call office if symptoms persist or worsen.  Reassess in 2 weeks.

## 2012-10-17 NOTE — Patient Instructions (Addendum)
Please contact our office if your symptoms do not improve or gets worse.  

## 2012-10-31 ENCOUNTER — Ambulatory Visit: Payer: Medicare Other | Admitting: Internal Medicine

## 2012-10-31 DIAGNOSIS — Z0289 Encounter for other administrative examinations: Secondary | ICD-10-CM

## 2012-11-21 ENCOUNTER — Ambulatory Visit: Payer: Medicare Other | Admitting: Internal Medicine

## 2012-11-21 DIAGNOSIS — Z0289 Encounter for other administrative examinations: Secondary | ICD-10-CM

## 2013-02-24 ENCOUNTER — Other Ambulatory Visit: Payer: Self-pay | Admitting: Internal Medicine

## 2013-04-07 ENCOUNTER — Other Ambulatory Visit: Payer: Self-pay | Admitting: *Deleted

## 2013-04-07 MED ORDER — HYDROCODONE-ACETAMINOPHEN 5-325 MG PO TABS: 1.0000 | ORAL_TABLET | Freq: Three times a day (TID) | ORAL | Status: DC | PRN

## 2013-06-01 ENCOUNTER — Ambulatory Visit: Payer: Medicare Other | Admitting: Family Medicine

## 2013-06-02 ENCOUNTER — Ambulatory Visit (INDEPENDENT_AMBULATORY_CARE_PROVIDER_SITE_OTHER): Payer: Medicare Other | Admitting: Family Medicine

## 2013-06-02 ENCOUNTER — Encounter: Payer: Self-pay | Admitting: Family Medicine

## 2013-06-02 VITALS — BP 122/70 | HR 77 | Temp 98.0°F | Wt 234.0 lb

## 2013-06-02 DIAGNOSIS — R55 Syncope and collapse: Secondary | ICD-10-CM

## 2013-06-02 DIAGNOSIS — E039 Hypothyroidism, unspecified: Secondary | ICD-10-CM

## 2013-06-02 LAB — CBC WITH DIFFERENTIAL/PLATELET
Basophils Absolute: 0 10*3/uL (ref 0.0–0.1)
Basophils Relative: 0.3 % (ref 0.0–3.0)
Eosinophils Absolute: 0.1 10*3/uL (ref 0.0–0.7)
Eosinophils Relative: 0.8 % (ref 0.0–5.0)
HCT: 43.9 % (ref 39.0–52.0)
Hemoglobin: 14.8 g/dL (ref 13.0–17.0)
Lymphocytes Relative: 24 % (ref 12.0–46.0)
Lymphs Abs: 1.8 10*3/uL (ref 0.7–4.0)
MCHC: 33.6 g/dL (ref 30.0–36.0)
MCV: 77.6 fl — ABNORMAL LOW (ref 78.0–100.0)
Monocytes Absolute: 0.6 10*3/uL (ref 0.1–1.0)
Monocytes Relative: 7.7 % (ref 3.0–12.0)
Neutro Abs: 4.9 10*3/uL (ref 1.4–7.7)
Neutrophils Relative %: 67.2 % (ref 43.0–77.0)
Platelets: 119 10*3/uL — ABNORMAL LOW (ref 150.0–400.0)
RBC: 5.66 Mil/uL (ref 4.22–5.81)
RDW: 14.8 % — ABNORMAL HIGH (ref 11.5–14.6)
WBC: 7.3 10*3/uL (ref 4.5–10.5)

## 2013-06-02 LAB — BASIC METABOLIC PANEL
BUN: 16 mg/dL (ref 6–23)
CO2: 27 mEq/L (ref 19–32)
Calcium: 9.4 mg/dL (ref 8.4–10.5)
Chloride: 104 mEq/L (ref 96–112)
Creatinine, Ser: 1.3 mg/dL (ref 0.4–1.5)
GFR: 55.87 mL/min — ABNORMAL LOW (ref >60.00)
Glucose, Bld: 77 mg/dL (ref 70–99)
Potassium: 3.8 mEq/L (ref 3.5–5.1)
Sodium: 138 mEq/L (ref 135–145)

## 2013-06-02 LAB — HEPATIC FUNCTION PANEL
ALT: 22 U/L (ref 0–53)
AST: 24 U/L (ref 0–37)
Albumin: 4.3 g/dL (ref 3.5–5.2)
Alkaline Phosphatase: 65 U/L (ref 39–117)
Bilirubin, Direct: 0.1 mg/dL (ref 0.0–0.3)
Total Bilirubin: 0.6 mg/dL (ref 0.3–1.2)
Total Protein: 6.9 g/dL (ref 6.0–8.3)

## 2013-06-02 LAB — TSH: TSH: 3.37 u[IU]/mL (ref 0.35–5.50)

## 2013-06-02 NOTE — Progress Notes (Signed)
Subjective:    Patient ID: GRAIDEN HENES, male    DOB: [birth year], 79 y.o.   MRN: [id number]  HPI Patient seen as a work in with syncopal episode which occurred 2 nights ago. No further episodes since then. This is the first time he ever experienced syncope. He has chronic problems including history of urinary urgency, chronic renal sufficiency, cervical spinal stenosis, chronic low back pain,, GERD  He relates lying down Wednesday night and estimates he was sleep about 15 minutes. He then woke up, was at the side of his bed and after a few seconds recalls feeling very dizzy and then subsequently woke up on the floor. He had hit right temporal area side of head on the bed. Unknown duration of syncope. No episodes since then. He had minimal lightheadedness with sitting yesterday but none since. He denies any recent confusion, headaches, history of seizure, nausea, vomiting, focal weakness, chest pains, dyspnea, or any recent palpitations.  He does take amitriptyline low-dose 25 mg at my for sleep but denies taking this over the past few days. He also takes VESIcare 10 mg each bedtime. He denies any recent diarrhea and states he has been drinking fluids well. He has occasional constipation issues. He denies any recent abdominal pain. No history of vasovagal syncope. He did not describe any urine or stool incontinence following recent episode. When he regained consciousness, he did not have any sedation or extreme lethargy  Past Medical History  Diagnosis Date  . Hypertension   . Anemia   . Depression   . Hyperlipidemia   . Chronic lower back pain   . Eczema   . LBBB (left bundle branch block)   . Peripheral vascular disease LOWER EXTREMITIES  . 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 Arthritis   . Nocturia   . GERD (gastroesophageal reflux disease) occasional  . Hearing loss of both ears   . 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 Tonsillectomy    . Hernia repair  1970'S  . Excision right wrist benign tumor  2002 (APPROX)  . Incision and drainage deep neck abscess    . Penile bx  05-09-2010  . Circumcision/ laser dissection penile glans cancer  06-02-2010  . Cystoscopy with urethral dilatation  03/28/2012    Procedure: CYSTOSCOPY WITH URETHRAL DILATATION;  Surgeon: Kathi Ludwig, MD;  Location: [facility];  Service: Urology;  Laterality: N/A;  excision biopsy extensive meatal penile carcinoma meatoplasty    reports that he has never smoked. He has never used smokeless tobacco. He reports that he does not drink alcohol or use illicit drugs. family history includes Arthritis in an other family member; Hyperlipidemia in an other family member; Hypertension in an other family member; Lung cancer in his mother; Prostate cancer in an other family member. No Known Allergies    Review of Systems  Constitutional: Negative for fever, chills, appetite change and unexpected weight change.  Eyes: Negative for visual disturbance.  Respiratory: Negative for cough and shortness of breath.   Cardiovascular: Negative for chest pain.  Gastrointestinal: Positive for constipation. Negative for nausea, vomiting, abdominal pain and diarrhea.  Endocrine: Negative for polydipsia and polyuria.  Neurological: Positive for dizziness and syncope. Negative for seizures, speech difficulty and headaches.  Hematological: Negative for adenopathy.  Objective:   Physical Exam  Constitutional: He is oriented to person, place, and time. He appears well-developed and well-nourished.  HENT:  Right Ear: External ear normal.  Left Ear: External ear normal.  Mouth/Throat: Oropharynx is clear and moist.  Eyes: Pupils are equal, round, and reactive to light.  Neck: Neck supple.  No carotid bruit  Cardiovascular: Normal rate.   Exam reveals no gallop.   Pulmonary/Chest: Effort normal and breath sounds normal. No respiratory distress. He has no wheezes. He has no rales.  Abdominal: Soft. He exhibits no mass. There is no tenderness. There is no rebound and no guarding.  Musculoskeletal: He exhibits no edema.  Lymphadenopathy:    He has no cervical adenopathy.  Neurological: He is alert and oriented to person, place, and time. No cranial nerve deficit. Coordination normal.  No focal weakness.  Psychiatric: He has a normal mood and affect.          Assessment & Plan:  Syncopal episode. Etiology unclear. He is not orthostatic today. No history of seizures. No history of vasovagal syncope. He does take anticholinergics with Elavil and VESIcare but did not take Elavil prior to this episode. Obtain labs with CBC, basic metabolic panel, hepatic panel, TSH. Check EKG. Consider event monitor. Schedule followup with primary within one to 2 weeks to reassess. We'll have him reduce anticholinergics and hold Elavil for now. Reduce VESIcare back to 5 mg each bedtime. He does not demonstrate orthostasis today.

## 2013-06-02 NOTE — Patient Instructions (Signed)
Hold amitriptyline for now Reduce VESIcare back to 5 mg daily Stay well hydrated and change positions slowly Follow up immediately for any recurrent episodes of syncope/loss of consciousness

## 2013-06-13 ENCOUNTER — Encounter: Payer: Medicare Other | Admitting: Internal Medicine

## 2013-06-14 ENCOUNTER — Encounter (INDEPENDENT_AMBULATORY_CARE_PROVIDER_SITE_OTHER): Payer: Medicare Other

## 2013-06-14 ENCOUNTER — Encounter: Payer: Self-pay | Admitting: *Deleted

## 2013-06-14 DIAGNOSIS — R55 Syncope and collapse: Secondary | ICD-10-CM

## 2013-06-14 NOTE — Progress Notes (Signed)
Patient ID: Hunter [name], male   DOB: 03-Mar-1934, 79 y.o.   MRN: [id number] E-Cardio verite 14 day cardiac event monitor applied to patient.

## 2013-06-28 ENCOUNTER — Encounter: Payer: Self-pay | Admitting: Internal Medicine

## 2013-06-30 ENCOUNTER — Encounter: Payer: Medicare Other | Admitting: Internal Medicine

## 2013-07-03 ENCOUNTER — Other Ambulatory Visit: Payer: Self-pay | Admitting: Internal Medicine

## 2013-07-03 DIAGNOSIS — R55 Syncope and collapse: Secondary | ICD-10-CM

## 2013-07-19 ENCOUNTER — Ambulatory Visit (INDEPENDENT_AMBULATORY_CARE_PROVIDER_SITE_OTHER): Payer: Medicare Other | Admitting: Physician Assistant

## 2013-07-19 ENCOUNTER — Encounter: Payer: Self-pay | Admitting: Physician Assistant

## 2013-07-19 VITALS — BP 150/80 | Ht 71.0 in | Wt 230.0 lb

## 2013-07-19 DIAGNOSIS — I10 Essential (primary) hypertension: Secondary | ICD-10-CM

## 2013-07-19 DIAGNOSIS — R55 Syncope and collapse: Secondary | ICD-10-CM

## 2013-07-19 DIAGNOSIS — E785 Hyperlipidemia, unspecified: Secondary | ICD-10-CM

## 2013-07-19 NOTE — Progress Notes (Signed)
[address], [place], [place]  [address] Phone: [phone] Fax:  [phone]  Date:  07/19/2013   ID:  Hunter [name], DOB [birth year], MRN [id number]  PCP:  Drema Pry, DO  Cardiologist:  Dr. Kirk Ruths     History of Present Illness: Hunter [name] is a 80 y.o. male with a history of HTN, HL, GERD, CKD, LBBB, penile cancer status post excision, spinal stenosis. Patient was evaluated by Dr. Stanford Breed in 05/2010 for surgical clearance and left bundle branch block. He underwent stress testing.  Lexiscan Myoview (05/2010): No ischemia, EF 60%, normal study.  Patient had ABIs in 10/2011 that were normal. He recently had a syncopal episode and was seen by primary care. He has been set up for an event monitor and presents today for follow up.  I just received a copy of the event monitor. This demonstrated NSR and no significant arrhythmia.    Patient tells me that he helps take care of his brother who recently had his leg amputated.  It is not unusual for him to get up several times a night. In November, he had apparently been up for several minutes and was probably on his way to the bathroom when he began to feel dizzy. He then proceeded to pass out. He denies any confusion. He denies evidence of tongue biting or loss of bowel or bladder function. He denies any palpitations. He denies any associated chest pain. He has been fairly active. He denies exertional chest discomfort or shortness of breath. He denies orthopnea, PND or significant edema. He typically has left lower extremity edema that is controlled by diuretics.  Recent Labs: 06/02/2013: ALT 22; Creatinine 1.3; Hemoglobin 14.8; Potassium 3.8; TSH 3.37   Wt Readings from Last 3 Encounters:  07/19/13 230 lb (104.327 kg)  06/02/13 234 lb (106.142 kg)  10/17/12 233 lb (105.688 kg)     Past Medical History  Diagnosis Date  . Hypertension   . Anemia   . Depression   . Hyperlipidemia   . Chronic lower back pain   . Eczema    . LBBB (left bundle branch block)   . Peripheral vascular disease LOWER EXTREMITIES  . 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 Arthritis   . Nocturia   . GERD (gastroesophageal reflux disease) occasional  . Hearing loss of both ears   . 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 amitriptyline (ELAVIL) 25 MG tablet Take 50 mg by mouth at bedtime as needed for sleep.       Marland Kitchen aspirin 81 MG tablet Take 81 mg by mouth daily.       Marland Kitchen HYDROcodone-acetaminophen (NORCO/VICODIN) 5-325 MG per tablet Take 1 tablet by mouth every 8 (eight) hours as needed for pain.  30 tablet  0  . ranitidine (ZANTAC) 150 MG tablet Take 150 mg by mouth as needed for heartburn.      . sertraline (ZOLOFT) 100 MG tablet Take 1 tablet (100 mg total) by mouth 2 (two) times daily.  60 tablet  5  . simvastatin (ZOCOR) 40 MG tablet TAKE ONE TABLET BY MOUTH AT BEDTIME. NEW DIRECTIONS  90 tablet  0  . solifenacin (VESICARE) 5 MG tablet Take 5 mg by mouth daily. Pt takes 5-10 mg daily      .  triamterene-hydrochlorothiazide (MAXZIDE-25) 37.5-25 MG per tablet Take 1 each (1 tablet total) by mouth daily.  90 tablet  1  . [DISCONTINUED] VESICARE 5 MG tablet TAKE ONE TABLET BY MOUTH EVERY DAY  90 each  1   No current facility-administered medications for this visit.    Allergies:   Review of patient's allergies indicates no known allergies.   Social History:  The patient  reports that he has never smoked. He has never used smokeless tobacco. He reports that he does not drink alcohol or use illicit drugs.   Family History:  The patient's family history includes Arthritis in an other family member; Hyperlipidemia in an other family member; Hypertension in an other family member; Lung cancer in his mother; Prostate cancer in an other family member.   ROS:  Please see the  history of present illness.   He has some residual fatigue after recent URI.   All other systems reviewed and negative.   PHYSICAL EXAM: VS:  BP 150/80  Ht 5\' 11"  (1.803 m)  Wt 230 lb (104.327 kg)  BMI 32.09 kg/m2 Well nourished, well developed, in no acute distress HEENT: normal Neck: no JVD Vascular: No carotid bruits Cardiac:  normal S1, S2; RRR; no murmur Lungs:  clear to auscultation bilaterally, no wheezing, rhonchi or rales Abd: soft, nontender, no hepatomegaly Ext: no edema Skin: warm and dry Neuro:  CNs 2-12 intact, no focal abnormalities noted  EKG:  NSR, HR 94, LBBB     ASSESSMENT AND PLAN:  1. Syncope: The description of his syncopal episode seems to be consistent with vasovagal syncope. He had a recent event monitor. This demonstrated NSR and no significant arrhythmia. He has a chronic left bundle branch block. He had a negative stress test in 2011. I will set him up for an echocardiogram to assess LV function. If his ejection fraction is normal, he will require no further cardiac workup, unless he has recurrent syncope. 2. Hypertension:  Borderline control.  Continue to monitor.   3. Hyperlipidemia: Continue statin. 4. Disposition: Follow up with Dr. Stanford Breed in 2 months.  Signed, Richardson Dopp, PA-C  07/19/2013 11:33 AM

## 2013-07-19 NOTE — Patient Instructions (Signed)
PLEASE SCHEDULE FOR AN ECHO; DX SYNCOPE  Your physician recommends that you continue on your current medications as directed. Please refer to the Current Medication list given to you today.  YOU WILL NEED TO FOLLOW UP WITH DR. CRENSHAW IN 2 MONTHS

## 2013-07-25 ENCOUNTER — Other Ambulatory Visit (HOSPITAL_COMMUNITY): Payer: Medicare Other

## 2013-08-02 ENCOUNTER — Other Ambulatory Visit: Payer: Self-pay | Admitting: Internal Medicine

## 2013-08-09 ENCOUNTER — Ambulatory Visit (HOSPITAL_COMMUNITY): Payer: Medicare Other | Attending: Physician Assistant | Admitting: Cardiology

## 2013-08-09 DIAGNOSIS — I10 Essential (primary) hypertension: Secondary | ICD-10-CM | POA: Insufficient documentation

## 2013-08-09 DIAGNOSIS — I447 Left bundle-branch block, unspecified: Secondary | ICD-10-CM | POA: Insufficient documentation

## 2013-08-09 DIAGNOSIS — E785 Hyperlipidemia, unspecified: Secondary | ICD-10-CM | POA: Insufficient documentation

## 2013-08-09 DIAGNOSIS — R55 Syncope and collapse: Secondary | ICD-10-CM

## 2013-08-09 NOTE — Progress Notes (Signed)
Echo performed. 

## 2013-08-10 ENCOUNTER — Encounter: Payer: Self-pay | Admitting: Physician Assistant

## 2013-08-14 ENCOUNTER — Ambulatory Visit (INDEPENDENT_AMBULATORY_CARE_PROVIDER_SITE_OTHER): Payer: Medicare Other | Admitting: Internal Medicine

## 2013-08-14 ENCOUNTER — Encounter: Payer: Self-pay | Admitting: Internal Medicine

## 2013-08-14 VITALS — BP 162/82 | HR 88 | Temp 98.3°F | Ht 71.0 in | Wt 238.0 lb

## 2013-08-14 DIAGNOSIS — F329 Major depressive disorder, single episode, unspecified: Secondary | ICD-10-CM

## 2013-08-14 DIAGNOSIS — F3289 Other specified depressive episodes: Secondary | ICD-10-CM

## 2013-08-14 DIAGNOSIS — Z0000 Encounter for general adult medical examination without abnormal findings: Secondary | ICD-10-CM

## 2013-08-14 DIAGNOSIS — E785 Hyperlipidemia, unspecified: Secondary | ICD-10-CM

## 2013-08-14 DIAGNOSIS — Z515 Encounter for palliative care: Secondary | ICD-10-CM | POA: Insufficient documentation

## 2013-08-14 DIAGNOSIS — R55 Syncope and collapse: Secondary | ICD-10-CM

## 2013-08-14 DIAGNOSIS — I10 Essential (primary) hypertension: Secondary | ICD-10-CM

## 2013-08-14 MED ORDER — TRIAMTERENE-HCTZ 37.5-25 MG PO TABS: 1.0000 | ORAL_TABLET | Freq: Every day | ORAL | Status: DC

## 2013-08-14 MED ORDER — SIMVASTATIN 40 MG PO TABS: 40.0000 mg | ORAL_TABLET | Freq: Every day | ORAL | Status: DC

## 2013-08-14 MED ORDER — SERTRALINE HCL 100 MG PO TABS: 100.0000 mg | ORAL_TABLET | Freq: Two times a day (BID) | ORAL | Status: DC

## 2013-08-14 MED ORDER — LOSARTAN POTASSIUM 50 MG PO TABS: 50.0000 mg | ORAL_TABLET | Freq: Every day | ORAL | Status: DC

## 2013-08-14 MED ORDER — RANITIDINE HCL 150 MG PO TABS: 150.0000 mg | ORAL_TABLET | Freq: Two times a day (BID) | ORAL | Status: DC | PRN

## 2013-08-14 NOTE — Assessment & Plan Note (Signed)
Reviewed adult health maintenance protocols.  Patient recently had syncopal episode of unclear etiology. It may be secondary to polypharmacy. Patient's amitriptyline and VESIcare were discontinued. No difficulty with memory or depression. Medicare questionnaire reviewed in detail. See attached form. Patient encouraged to get his hearing aids replaced.

## 2013-08-14 NOTE — Assessment & Plan Note (Signed)
Continue same dose of sertraline.

## 2013-08-14 NOTE — Patient Instructions (Signed)
Please complete the following lab tests before your next follow up appointment: BMET - 401.9 FLP, LFTs, TSH - 401.9

## 2013-08-14 NOTE — Progress Notes (Signed)
Pre visit review using our clinic review tool, if applicable. No additional management support is needed unless otherwise documented below in the visit note. 

## 2013-08-14 NOTE — Assessment & Plan Note (Signed)
Poorly controlled.  Add losartan 50 mg.  BP: 162/82 mmHg

## 2013-08-14 NOTE — Progress Notes (Signed)
Subjective:    Patient ID: Hunter [name], male    DOB: 11-Aug-1933, 80 y.o.   MRN: [id number]  HPI  80 year old white male with history of hypertension, depression chronic insomnia for Medicare physical. Interval medical history-patient seen by Dr. Elease Hashimoto 1 to 2 months ago for syncopal episode. His symptoms attributed to possible polypharmacy. He was referred to cardiology. His echocardiogram was unremarkable.  Medicare was questionnaire reviewed in detail. Patient followed by Dr. Gaynelle Arabian for squamous cell carcinoma was cleansed. See attached questionnaire for details.  He has chronic hearing loss. He is having difficulty with his hearing aides.  Standardized Mini-Mental status exam performed. Patient scored 30 of 30.  Hypertension - BP elevated.  He is only taking Maxzide.  Review of Systems  Constitutional: Negative for activity change, appetite change and unexpected weight change.  Eyes: Negative for visual disturbance.  Respiratory: Negative for cough, chest tightness and shortness of breath.   Cardiovascular: Negative for chest pain.  Genitourinary: Negative for difficulty urinating.  Neurological: Negative for headaches.  Gastrointestinal: Negative for abdominal pain, heartburn melena or hematochezia Psych: Negative for depression or anxiety Endo:  No polyuria or polydypsia        Past Medical History  Diagnosis Date  . Hypertension   . Anemia   . Depression   . Hyperlipidemia   . Chronic lower back pain   . Eczema   . LBBB (left bundle branch block)   . Peripheral vascular disease LOWER EXTREMITIES  . 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 Arthritis   . Nocturia   . GERD (gastroesophageal reflux disease) occasional  . Hearing loss of both ears   . 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 Hx of echocardiogram     Echo (07/2013): Mild LVH, EF 55-60%, grade 1  diastolic dysfunction, mild LAE, PASP 23    History   Social History  . Marital Status: Single    Spouse Name: N/A    Number of Children: N/A  . Years of Education: N/A   Occupational History  . Not on file.   Social History Main Topics  . Smoking status: Never Smoker   . Smokeless tobacco: Never Used  . Alcohol Use: No  . Drug Use: No  . Sexual Activity: Not on file   Other Topics Concern  . Not on file   Social History Narrative  . No narrative on file    Past Surgical History  Procedure Laterality Date  . Tonsillectomy    . Hernia repair  1970'S  . Excision right wrist benign tumor  2002 (APPROX)  . Incision and drainage deep neck abscess    . Penile bx  05-09-2010  . Circumcision/ laser dissection penile glans cancer  06-02-2010  . Cystoscopy with urethral dilatation  03/28/2012    Procedure: CYSTOSCOPY WITH URETHRAL DILATATION;  Surgeon: Ailene Rud, MD;  Location: [facility];  Service: Urology;  Laterality: N/A;  excision biopsy extensive meatal penile carcinoma meatoplasty    Family History  Problem Relation Age of Onset  . Arthritis      family hx  . Hyperlipidemia      family hx  . Hypertension      family hx  . Prostate cancer      family hx  . Lung cancer Mother     No Known Allergies  Current Outpatient  Prescriptions on File Prior to Visit  Medication Sig Dispense Refill  . aspirin 81 MG tablet Take 81 mg by mouth daily.       Marland Kitchen HYDROcodone-acetaminophen (NORCO/VICODIN) 5-325 MG per tablet Take 1 tablet by mouth every 8 (eight) hours as needed for pain.  30 tablet  0  . [DISCONTINUED] VESICARE 5 MG tablet TAKE ONE TABLET BY MOUTH EVERY DAY  90 each  1   No current facility-administered medications on file prior to visit.    BP 162/82  Pulse 88  Temp(Src) 98.3 F (36.8 C) (Oral)  Ht 5\' 11"  (1.803 m)  Wt 238 lb (107.956 kg)  BMI 33.21 kg/m2    Objective:   Physical Exam  Constitutional: He is oriented to  person, place, and time. He appears well-developed and well-nourished. No distress.  HENT:  Head: Normocephalic and atraumatic.  Right Ear: External ear normal.  Left Ear: External ear normal.  Mouth/Throat: Oropharynx is clear and moist.  Gross hearing loss bilaterally  Eyes: Conjunctivae and EOM are normal. Pupils are equal, round, and reactive to light.  Neck: Neck supple.  No carotid bruit  Cardiovascular: Normal rate, regular rhythm and normal heart sounds.   No murmur heard. Pulmonary/Chest: Effort normal and breath sounds normal. He has no wheezes.  Abdominal: Soft. Bowel sounds are normal. There is no tenderness.  Musculoskeletal: He exhibits no edema.  Neurological: He is alert and oriented to person, place, and time. He displays normal reflexes. No cranial nerve deficit. He exhibits normal muscle tone.  Slight unsteady gait, negative Romberg  Skin: Skin is warm and dry.  Psychiatric: He has a normal mood and affect. His behavior is normal.          Assessment & Plan:

## 2013-08-14 NOTE — Assessment & Plan Note (Signed)
Cardiac work up negative.  2D Echo normal.  Syncope presumed secondary to possible polypharmacy. Amitriptyline and VESIcare discontinued

## 2013-08-15 ENCOUNTER — Telehealth: Payer: Self-pay | Admitting: Internal Medicine

## 2013-08-15 DIAGNOSIS — I10 Essential (primary) hypertension: Secondary | ICD-10-CM

## 2013-08-15 NOTE — Telephone Encounter (Signed)
Relevant patient education mailed to patient.  

## 2013-09-25 ENCOUNTER — Other Ambulatory Visit (INDEPENDENT_AMBULATORY_CARE_PROVIDER_SITE_OTHER): Payer: Medicare Other

## 2013-09-25 ENCOUNTER — Encounter: Payer: Self-pay | Admitting: Cardiology

## 2013-09-25 ENCOUNTER — Ambulatory Visit (INDEPENDENT_AMBULATORY_CARE_PROVIDER_SITE_OTHER): Payer: Medicare Other | Admitting: Cardiology

## 2013-09-25 VITALS — BP 126/70 | HR 80 | Ht 71.0 in | Wt 239.1 lb

## 2013-09-25 DIAGNOSIS — I10 Essential (primary) hypertension: Secondary | ICD-10-CM

## 2013-09-25 DIAGNOSIS — R55 Syncope and collapse: Secondary | ICD-10-CM

## 2013-09-25 DIAGNOSIS — E785 Hyperlipidemia, unspecified: Secondary | ICD-10-CM

## 2013-09-25 LAB — HEPATIC FUNCTION PANEL
ALT: 21 U/L (ref 0–53)
AST: 21 U/L (ref 0–37)
Albumin: 4.8 g/dL (ref 3.5–5.2)
Alkaline Phosphatase: 61 U/L (ref 39–117)
Bilirubin, Direct: 0 mg/dL (ref 0.0–0.3)
Total Bilirubin: 0.5 mg/dL (ref 0.3–1.2)
Total Protein: 7.4 g/dL (ref 6.0–8.3)

## 2013-09-25 LAB — LIPID PANEL
Cholesterol: 140 mg/dL (ref 0–200)
HDL: 34.2 mg/dL — ABNORMAL LOW (ref >39.00)
LDL Cholesterol: 52 mg/dL (ref 0–99)
Total CHOL/HDL Ratio: 4
Triglycerides: 267 mg/dL — ABNORMAL HIGH (ref 0.0–149.0)
VLDL: 53.4 mg/dL — ABNORMAL HIGH (ref 0.0–40.0)

## 2013-09-25 LAB — BASIC METABOLIC PANEL
BUN: 23 mg/dL (ref 6–23)
CO2: 29 mEq/L (ref 19–32)
Calcium: 9.9 mg/dL (ref 8.4–10.5)
Chloride: 105 mEq/L (ref 96–112)
Creatinine, Ser: 1.4 mg/dL (ref 0.4–1.5)
GFR: 50.9 mL/min — ABNORMAL LOW (ref >60.00)
Glucose, Bld: 92 mg/dL (ref 70–99)
Potassium: 4.2 mEq/L (ref 3.5–5.1)
Sodium: 140 mEq/L (ref 135–145)

## 2013-09-25 LAB — TSH: TSH: 4.97 u[IU]/mL (ref 0.35–5.50)

## 2013-09-25 NOTE — Progress Notes (Signed)
HPI: FU syncope; also history of HTN, HL, GERD, CKD, LBBB, penile cancer status post excision, spinal stenosis. Lexiscan Myoview (05/2010): No ischemia, EF 60%, normal study. Patient had ABIs in 10/2011 that were normal. He recently had a syncopal episode; monitor in December of 2014 showed sinus rhythm. Echocardiogram in January of 2015 showed normal LV function, grade 1 diastolic dysfunction, mild left atrial enlargement and trace mitral regurgitation. Since he was last seen, the patient has dyspnea with more extreme activities but not with routine activities. It is relieved with rest. It is not associated with chest pain. There is no orthopnea, PND or pedal edema. There is no syncope or palpitations. There is no exertional chest pain.    Current Outpatient Prescriptions  Medication Sig Dispense Refill  . aspirin 81 MG tablet Take 81 mg by mouth daily.       Marland Kitchen HYDROcodone-acetaminophen (NORCO/VICODIN) 5-325 MG per tablet Take 1 tablet by mouth every 8 (eight) hours as needed for pain.  30 tablet  0  . losartan (COZAAR) 50 MG tablet Take 1 tablet (50 mg total) by mouth daily.  90 tablet  1  . ranitidine (ZANTAC) 150 MG tablet Take 1 tablet (150 mg total) by mouth 2 (two) times daily as needed for heartburn.  180 tablet  1  . sertraline (ZOLOFT) 100 MG tablet Take 1 tablet (100 mg total) by mouth 2 (two) times daily.  180 tablet  1  . simvastatin (ZOCOR) 40 MG tablet Take 1 tablet (40 mg total) by mouth daily at 6 PM.  90 tablet  1  . triamterene-hydrochlorothiazide (MAXZIDE-25) 37.5-25 MG per tablet Take 1 tablet by mouth daily.  90 tablet  1  . [DISCONTINUED] VESICARE 5 MG tablet TAKE ONE TABLET BY MOUTH EVERY DAY  90 each  1   No current facility-administered medications for this visit.     Past Medical History  Diagnosis Date  . Hypertension   . Anemia   . Depression   . Hyperlipidemia   . Chronic lower back pain   . Eczema   . LBBB (left bundle branch block)   . Peripheral  vascular disease LOWER EXTREMITIES  . 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 Arthritis   . Nocturia   . GERD (gastroesophageal reflux disease) occasional  . Hearing loss of both ears   . 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 Hx of echocardiogram     Echo (07/2013): Mild LVH, EF 17-51%, grade 1 diastolic dysfunction, mild LAE, PASP 23    Past Surgical History  Procedure Laterality Date  . Tonsillectomy    . Hernia repair  1970'S  . Excision right wrist benign tumor  2002 (APPROX)  . Incision and drainage deep neck abscess    . Penile bx  05-09-2010  . Circumcision/ laser dissection penile glans cancer  06-02-2010  . Cystoscopy with urethral dilatation  03/28/2012    Procedure: CYSTOSCOPY WITH URETHRAL DILATATION;  Surgeon: Ailene Rud, MD;  Location: [facility];  Service: Urology;  Laterality: N/A;  excision biopsy extensive meatal penile carcinoma meatoplasty    History   Social History  . Marital Status: Single    Spouse Name: N/A    Number of Children: N/A  . Years of Education: N/A   Occupational History  . Not on file.   Social History Main  Topics  . Smoking status: Never Smoker   . Smokeless tobacco: Never Used  . Alcohol Use: No  . Drug Use: No  . Sexual Activity: Not on file   Other Topics Concern  . Not on file   Social History Narrative  . No narrative on file    ROS: no fevers or chills, productive cough, hemoptysis, dysphasia, odynophagia, melena, hematochezia, dysuria, hematuria, rash, seizure activity, orthopnea, PND, pedal edema, claudication. Remaining systems are negative.  Physical Exam: Well-developed well-nourished in no acute distress.  Skin is warm and dry.  HEENT is normal.  Neck is supple.  Chest is clear to auscultation with normal expansion.  Cardiovascular exam is regular rate and rhythm.  Abdominal exam  nontender or distended. No masses palpated. Extremities show no edema. neuro grossly intact

## 2013-09-25 NOTE — Addendum Note (Signed)
Addended by: Townsend Roger D on: 09/25/2013 12:40 PM   Modules accepted: Orders

## 2013-09-25 NOTE — Patient Instructions (Signed)
Your physician wants you to follow-up in: 6 MONTHS WITH DR CRENSHAW You will receive a reminder letter in the mail two months in advance. If you don't receive a letter, please call our office to schedule the follow-up appointment.  

## 2013-09-25 NOTE — Assessment & Plan Note (Signed)
Blood pressure controlled. Continue present medications. 

## 2013-09-25 NOTE — Assessment & Plan Note (Signed)
Possibly orthostatic mediated. No recurrent episodes. LV function normal.

## 2013-10-06 ENCOUNTER — Ambulatory Visit (INDEPENDENT_AMBULATORY_CARE_PROVIDER_SITE_OTHER): Payer: Medicare Other | Admitting: Internal Medicine

## 2013-10-06 ENCOUNTER — Encounter: Payer: Self-pay | Admitting: Internal Medicine

## 2013-10-06 VITALS — BP 132/62 | HR 84 | Temp 98.5°F | Ht 71.0 in | Wt 241.0 lb

## 2013-10-06 DIAGNOSIS — I10 Essential (primary) hypertension: Secondary | ICD-10-CM

## 2013-10-06 DIAGNOSIS — R350 Frequency of micturition: Secondary | ICD-10-CM

## 2013-10-06 DIAGNOSIS — M545 Low back pain, unspecified: Secondary | ICD-10-CM

## 2013-10-06 DIAGNOSIS — R55 Syncope and collapse: Secondary | ICD-10-CM

## 2013-10-06 MED ORDER — LEVOTHYROXINE SODIUM 25 MCG PO TABS: 25.0000 ug | ORAL_TABLET | Freq: Every day | ORAL | Status: DC

## 2013-10-06 MED ORDER — HYDROCODONE-ACETAMINOPHEN 5-325 MG PO TABS: 1.0000 | ORAL_TABLET | Freq: Three times a day (TID) | ORAL | Status: DC | PRN

## 2013-10-06 NOTE — Assessment & Plan Note (Addendum)
BP is well controlled. No change in medication.  Monitor electrolytes and kidney function.  BP: 132/62 mmHg  Lab Results  Component Value Date   CREATININE 1.4 09/25/2013   Lab Results  Component Value Date   NA 140 09/25/2013   K 4.2 09/25/2013   CL 105 09/25/2013   CO2 29 09/25/2013

## 2013-10-06 NOTE — Assessment & Plan Note (Signed)
Amitriptyline was discontinued. He has not had had any recurrent episodes.

## 2013-10-06 NOTE — Progress Notes (Signed)
Pre visit review using our clinic review tool, if applicable. No additional management support is needed unless otherwise documented below in the visit note. 

## 2013-10-06 NOTE — Progress Notes (Signed)
Subjective:    Patient ID: Hunter [name], male    DOB: 1934-03-25, 80 y.o.   MRN: [id number]  HPI  80 year old white male with history of hypertension, urinary frequency and syncope for followup. Interval medical history-patient was seen by cardiology. Cardiac workup unremarkable. He reports no further episodes of presyncope or syncope.  Hypertension - stable  Urinary frequency-VESIcare was discontinued. He continues to have symptoms of urinary frequency. He reports recent evaluation by urologist who started tamsulosin.  Chronic low back pain - he reports intermittent symptoms.  He is not interested in surgical intervention.  He uses vicodin sparingly.    Review of Systems Negative for chest pain.  Weak urine stream    Past Medical History  Diagnosis Date  . Hypertension   . Anemia   . Depression   . Hyperlipidemia   . Chronic lower back pain   . Eczema   . LBBB (left bundle branch block)   . Peripheral vascular disease LOWER EXTREMITIES  . 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 Arthritis   . Nocturia   . GERD (gastroesophageal reflux disease) occasional  . Hearing loss of both ears   . 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 Hx of echocardiogram     Echo (07/2013): Mild LVH, EF 61-44%, grade 1 diastolic dysfunction, mild LAE, PASP 23    History   Social History  . Marital Status: Single    Spouse Name: N/A    Number of Children: N/A  . Years of Education: N/A   Occupational History  . Not on file.   Social History Main Topics  . Smoking status: Never Smoker   . Smokeless tobacco: Never Used  . Alcohol Use: No  . Drug Use: No  . Sexual Activity: Not on file   Other Topics Concern  . Not on file   Social History Narrative  . No narrative on file    Past Surgical History  Procedure Laterality Date  . Tonsillectomy    . Hernia repair  1970'S  . Excision  right wrist benign tumor  2002 (APPROX)  . Incision and drainage deep neck abscess    . Penile bx  05-09-2010  . Circumcision/ laser dissection penile glans cancer  06-02-2010  . Cystoscopy with urethral dilatation  03/28/2012    Procedure: CYSTOSCOPY WITH URETHRAL DILATATION;  Surgeon: Ailene Rud, MD;  Location: [facility];  Service: Urology;  Laterality: N/A;  excision biopsy extensive meatal penile carcinoma meatoplasty    Family History  Problem Relation Age of Onset  . Arthritis      family hx  . Hyperlipidemia      family hx  . Hypertension      family hx  . Prostate cancer      family hx  . Lung cancer Mother     No Known Allergies  Current Outpatient Prescriptions on File Prior to Visit  Medication Sig Dispense Refill  . aspirin 81 MG tablet Take 81 mg by mouth daily.       Marland Kitchen HYDROcodone-acetaminophen (NORCO/VICODIN) 5-325 MG per tablet Take 1 tablet by mouth every 8 (eight) hours as needed for pain.  30 tablet  0  . losartan (COZAAR) 50 MG tablet Take 1 tablet (50 mg total) by mouth daily.  90 tablet  1  . ranitidine (ZANTAC) 150 MG tablet Take 1  tablet (150 mg total) by mouth 2 (two) times daily as needed for heartburn.  180 tablet  1  . sertraline (ZOLOFT) 100 MG tablet Take 1 tablet (100 mg total) by mouth 2 (two) times daily.  180 tablet  1  . simvastatin (ZOCOR) 40 MG tablet Take 1 tablet (40 mg total) by mouth daily at 6 PM.  90 tablet  1  . triamterene-hydrochlorothiazide (MAXZIDE-25) 37.5-25 MG per tablet Take 1 tablet by mouth daily.  90 tablet  1  . [DISCONTINUED] VESICARE 5 MG tablet TAKE ONE TABLET BY MOUTH EVERY DAY  90 each  1   No current facility-administered medications on file prior to visit.    BP 132/62  Pulse 84  Temp(Src) 98.5 F (36.9 C) (Oral)  Ht 5\' 11"  (1.803 m)  Wt 241 lb (109.317 kg)  BMI 33.63 kg/m2    Objective:   Physical Exam  Constitutional: He is oriented to person, place, and time. He appears  well-developed and well-nourished.  HENT:  Head: Normocephalic and atraumatic.  Mild oropharyngeal erythema  Neck: Neck supple.  No carotid bruit  Cardiovascular: Normal rate, regular rhythm and normal heart sounds.   No murmur heard. Pulmonary/Chest: Effort normal and breath sounds normal. He has no wheezes.  Musculoskeletal: He exhibits no edema.  Neurological: He is alert and oriented to person, place, and time. No cranial nerve deficit. He exhibits normal muscle tone.  Skin: Skin is warm and dry.  Psychiatric: He has a normal mood and affect. His behavior is normal.          Assessment & Plan:

## 2013-10-06 NOTE — Assessment & Plan Note (Addendum)
VESIcare discontinued. It is cost prohibitive. He was recently seen by his urologist. Patient reports she was started on tamsulosin 0.4 mg.

## 2013-10-06 NOTE — Assessment & Plan Note (Signed)
Patient has chronic spondylitic changes of lumbar spine.  He also reports lumbar disc disease.  He is not interested in surgical treatment.  He understands to use vicodin sparingly.

## 2013-10-06 NOTE — Patient Instructions (Signed)
Please complete the following lab tests before your next follow up appointment: BMET - 401.9 TSH - 244.9 

## 2013-12-06 ENCOUNTER — Encounter: Payer: Self-pay | Admitting: Internal Medicine

## 2013-12-06 ENCOUNTER — Ambulatory Visit (INDEPENDENT_AMBULATORY_CARE_PROVIDER_SITE_OTHER)
Admission: RE | Admit: 2013-12-06 | Discharge: 2013-12-06 | Disposition: A | Discharge status: Home / Self Care | Payer: Medicare Other | Source: Ambulatory Visit | Attending: Internal Medicine | Admitting: Internal Medicine

## 2013-12-06 ENCOUNTER — Ambulatory Visit (INDEPENDENT_AMBULATORY_CARE_PROVIDER_SITE_OTHER): Payer: Medicare Other | Admitting: Internal Medicine

## 2013-12-06 VITALS — BP 134/62 | HR 77 | Temp 98.3°F | Ht 71.0 in | Wt 241.0 lb

## 2013-12-06 DIAGNOSIS — E039 Hypothyroidism, unspecified: Secondary | ICD-10-CM

## 2013-12-06 DIAGNOSIS — R0602 Shortness of breath: Secondary | ICD-10-CM

## 2013-12-06 DIAGNOSIS — I5030 Unspecified diastolic (congestive) heart failure: Secondary | ICD-10-CM | POA: Insufficient documentation

## 2013-12-06 HISTORY — DX: Unspecified diastolic (congestive) heart failure: I50.30

## 2013-12-06 LAB — CBC WITH DIFFERENTIAL/PLATELET
Basophils Absolute: 0 10*3/uL (ref 0.0–0.1)
Basophils Relative: 0.3 % (ref 0.0–3.0)
Eosinophils Absolute: 0.1 10*3/uL (ref 0.0–0.7)
Eosinophils Relative: 1.2 % (ref 0.0–5.0)
HCT: 45.3 % (ref 39.0–52.0)
Hemoglobin: 15.2 g/dL (ref 13.0–17.0)
Lymphocytes Relative: 26.5 % (ref 12.0–46.0)
Lymphs Abs: 2 10*3/uL (ref 0.7–4.0)
MCHC: 33.5 g/dL (ref 30.0–36.0)
MCV: 79.8 fl (ref 78.0–100.0)
Monocytes Absolute: 0.6 10*3/uL (ref 0.1–1.0)
Monocytes Relative: 7.4 % (ref 3.0–12.0)
Neutro Abs: 4.9 10*3/uL (ref 1.4–7.7)
Neutrophils Relative %: 64.6 % (ref 43.0–77.0)
Platelets: 124 10*3/uL — ABNORMAL LOW (ref 150.0–400.0)
RBC: 5.67 Mil/uL (ref 4.22–5.81)
RDW: 14.5 % (ref 11.5–15.5)
WBC: 7.7 10*3/uL (ref 4.0–10.5)

## 2013-12-06 LAB — TSH: TSH: 2.26 u[IU]/mL (ref 0.35–4.50)

## 2013-12-06 LAB — BASIC METABOLIC PANEL
BUN: 23 mg/dL (ref 6–23)
CO2: 25 mEq/L (ref 19–32)
Calcium: 9.8 mg/dL (ref 8.4–10.5)
Chloride: 105 mEq/L (ref 96–112)
Creatinine, Ser: 1.4 mg/dL (ref 0.4–1.5)
GFR: 50.47 mL/min — ABNORMAL LOW (ref >60.00)
Glucose, Bld: 94 mg/dL (ref 70–99)
Potassium: 4.3 mEq/L (ref 3.5–5.1)
Sodium: 139 mEq/L (ref 135–145)

## 2013-12-06 LAB — BRAIN NATRIURETIC PEPTIDE: Pro B Natriuretic peptide (BNP): 19 pg/mL (ref 0.0–100.0)

## 2013-12-06 NOTE — Progress Notes (Signed)
Subjective:    Patient ID: Hunter [name], male    DOB: [birth year], 80 y.o.   MRN: [id number]  HPI  80 year old white male with history of hypertension, anemia and chronic low back pain complains of progressive shortness of breath over last couple of months. Patient denies any specific trigger. He denies any symptoms of chest pain or palpitations.  He has noticed increased swelling especially in his left lower extremity.  He has never been a smoker. He denies cough.   Review of Systems Negative for chest pain,  Dyspnea with exertion.  Negative for orthopnea.    Past Medical History  Diagnosis Date  . Hypertension   . Anemia   . Depression   . Hyperlipidemia   . Chronic lower back pain   . Eczema   . LBBB (left bundle branch block)   . Peripheral vascular disease LOWER EXTREMITIES  . 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 Arthritis   . Nocturia   . GERD (gastroesophageal reflux disease) occasional  . Hearing loss of both ears   . 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 Hx of echocardiogram     Echo (07/2013): Mild LVH, EF 97-67%, grade 1 diastolic dysfunction, mild LAE, PASP 23    History   Social History  . Marital Status: Single    Spouse Name: N/A    Number of Children: N/A  . Years of Education: N/A   Occupational History  . Not on file.   Social History Main Topics  . Smoking status: Never Smoker   . Smokeless tobacco: Never Used  . Alcohol Use: No  . Drug Use: No  . Sexual Activity: Not on file   Other Topics Concern  . Not on file   Social History Narrative  . No narrative on file    Past Surgical History  Procedure Laterality Date  . Tonsillectomy    . Hernia repair  1970'S  . Excision right wrist benign tumor  2002 (APPROX)  . Incision and drainage deep neck abscess    . Penile bx  05-09-2010  . Circumcision/ laser dissection penile glans  cancer  06-02-2010  . Cystoscopy with urethral dilatation  03/28/2012    Procedure: CYSTOSCOPY WITH URETHRAL DILATATION;  Surgeon: Ailene Rud, MD;  Location: [facility];  Service: Urology;  Laterality: N/A;  excision biopsy extensive meatal penile carcinoma meatoplasty    Family History  Problem Relation Age of Onset  . Arthritis      family hx  . Hyperlipidemia      family hx  . Hypertension      family hx  . Prostate cancer      family hx  . Lung cancer Mother     No Known Allergies  Current Outpatient Prescriptions on File Prior to Visit  Medication Sig Dispense Refill  . aspirin 81 MG tablet Take 81 mg by mouth daily.       Marland Kitchen HYDROcodone-acetaminophen (NORCO/VICODIN) 5-325 MG per tablet Take 1 tablet by mouth every 8 (eight) hours as needed.  60 tablet  0  . levothyroxine (SYNTHROID, LEVOTHROID) 25 MCG tablet Take 1 tablet (25 mcg total) by mouth daily before breakfast.  90 tablet  1  . losartan (COZAAR) 50 MG tablet Take 1 tablet (50 mg total) by mouth daily.  90 tablet  1  . ranitidine (ZANTAC) 150  MG tablet Take 1 tablet (150 mg total) by mouth 2 (two) times daily as needed for heartburn.  180 tablet  1  . sertraline (ZOLOFT) 100 MG tablet Take 1 tablet (100 mg total) by mouth 2 (two) times daily.  180 tablet  1  . simvastatin (ZOCOR) 40 MG tablet Take 1 tablet (40 mg total) by mouth daily at 6 PM.  90 tablet  1  . tamsulosin (FLOMAX) 0.4 MG CAPS capsule Take 1 capsule (0.4 mg total) by mouth daily.  30 capsule  3  . triamterene-hydrochlorothiazide (MAXZIDE-25) 37.5-25 MG per tablet Take 1 tablet by mouth daily.  90 tablet  1  . [DISCONTINUED] VESICARE 5 MG tablet TAKE ONE TABLET BY MOUTH EVERY DAY  90 each  1   No current facility-administered medications on file prior to visit.    BP 134/62  Pulse 77  Temp(Src) 98.3 F (36.8 C) (Oral)  Ht 5\' 11"  (1.803 m)  Wt 241 lb (109.317 kg)  BMI 33.63 kg/m2    Objective:   Physical Exam    Constitutional: He is oriented to person, place, and time. He appears well-developed and well-nourished. No distress.  HENT:  Head: Normocephalic and atraumatic.  Mouth/Throat: Oropharynx is clear and moist.  Cardiovascular: Normal rate, regular rhythm and normal heart sounds.   Pulmonary/Chest: Effort normal and breath sounds normal. He has no wheezes.  Abdominal: Soft. Bowel sounds are normal. He exhibits no distension.  Musculoskeletal: He exhibits no edema.  Neurological: He is alert and oriented to person, place, and time. No cranial nerve deficit.  Skin: Skin is warm and dry.  Erythematous macular papular rash and over chest and lower neck  Psychiatric: He has a normal mood and affect. His behavior is normal.          Assessment & Plan:

## 2013-12-06 NOTE — Assessment & Plan Note (Signed)
80 year old white male with shortness of breath of unclear etiology. He underwent cardiac workup for syncope in January of 2015. 2-D echocardiogram showed mild LVH with normal systolic function in the range of 55-60%. There was noted septal-lateral dyssynchrony. Doppler parameters consistent with grade 1 diastolic dysfunction..    Obtain CXR, CBCD, and BNP.  He has left lower extremity swelling.  Rule out DVT.  Obtain venous dopplers.  Lab Results  Component Value Date   WBC 7.7 12/06/2013   HGB 15.2 12/06/2013   HCT 45.3 12/06/2013   MCV 79.8 12/06/2013   PLT 124.0* 12/06/2013

## 2013-12-06 NOTE — Progress Notes (Signed)
Pre visit review using our clinic review tool, if applicable. No additional management support is needed unless otherwise documented below in the visit note. 

## 2013-12-06 NOTE — Assessment & Plan Note (Signed)
Stable.  Continue same dose of levothyroxine. Lab Results  Component Value Date   TSH 2.26 12/06/2013

## 2013-12-14 ENCOUNTER — Ambulatory Visit (HOSPITAL_COMMUNITY): Payer: Medicare Other | Attending: Cardiovascular Disease | Admitting: Cardiology

## 2013-12-14 DIAGNOSIS — E785 Hyperlipidemia, unspecified: Secondary | ICD-10-CM | POA: Insufficient documentation

## 2013-12-14 DIAGNOSIS — R0602 Shortness of breath: Secondary | ICD-10-CM | POA: Insufficient documentation

## 2013-12-14 DIAGNOSIS — M7989 Other specified soft tissue disorders: Secondary | ICD-10-CM

## 2013-12-14 DIAGNOSIS — I10 Essential (primary) hypertension: Secondary | ICD-10-CM | POA: Insufficient documentation

## 2013-12-14 NOTE — Progress Notes (Signed)
Lower venous duplex bilateral completed. 

## 2014-03-26 ENCOUNTER — Ambulatory Visit (INDEPENDENT_AMBULATORY_CARE_PROVIDER_SITE_OTHER): Payer: Medicare Other | Admitting: Internal Medicine

## 2014-03-26 ENCOUNTER — Encounter: Payer: Self-pay | Admitting: Internal Medicine

## 2014-03-26 VITALS — BP 148/58 | Temp 97.7°F | Wt 243.0 lb

## 2014-03-26 DIAGNOSIS — I872 Venous insufficiency (chronic) (peripheral): Secondary | ICD-10-CM

## 2014-03-26 DIAGNOSIS — Z85828 Personal history of other malignant neoplasm of skin: Secondary | ICD-10-CM | POA: Insufficient documentation

## 2014-03-26 DIAGNOSIS — I8310 Varicose veins of unspecified lower extremity with inflammation: Secondary | ICD-10-CM

## 2014-03-26 DIAGNOSIS — L989 Disorder of the skin and subcutaneous tissue, unspecified: Secondary | ICD-10-CM

## 2014-03-26 HISTORY — DX: Personal history of other malignant neoplasm of skin: Z85.828

## 2014-03-26 NOTE — Progress Notes (Signed)
Pre visit review using our clinic review tool, if applicable. No additional management support is needed unless otherwise documented below in the visit note.  Chief Complaint  Patient presents with  . Follow-up    leg sores?    HPI: Patient Hunter [name]  comes in today for SDA for  problem evaluation. Doc of the day ? Follow up PCP NA he is also hard of hearing so interview took a bit more time Worried about leg sores   Swelling ? If could have diabetes   Dr Amalia Hailey treated him for  [facility] left great toe  Following and wearing a knee brace  After a month... think he is better Brother lost legs from diabetes  And smokes  Lots of ets so  Changes  Filters .   Under cooper adn abis  2 years ago and ok but of course he gets concerned about developing a problem. He has seen dermatologist in the past Dr. Nevada Crane but nothing recently. He is followed for history of penile cancer. He apparently has had swelling in his legs for a while and uses the cream for itching or hydration. Sometimes the area is sore. ROS: See pertinent positives and negatives per HPI. No current cough or falling or acute bleeding  Past Medical History  Diagnosis Date  . Hypertension   . Anemia   . Depression   . Hyperlipidemia   . Chronic lower back pain   . Eczema   . LBBB (left bundle branch block)   . Peripheral vascular disease LOWER EXTREMITIES  . 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 Arthritis   . Nocturia   . GERD (gastroesophageal reflux disease) occasional  . Hearing loss of both ears   . 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 Hx of echocardiogram     Echo (07/2013): Mild LVH, EF 21-22%, grade 1 diastolic dysfunction, mild LAE, PASP 23    Family History  Problem Relation Age of Onset  . Arthritis      family hx  . Hyperlipidemia      family hx  . Hypertension      family hx  . Prostate cancer     family hx  . Lung cancer Mother     History   Social History  . Marital Status: Single    Spouse Name: N/A    Number of Children: N/A  . Years of Education: N/A   Social History Main Topics  . Smoking status: Never Smoker   . Smokeless tobacco: Never Used  . Alcohol Use: No  . Drug Use: No  . Sexual Activity: None   Other Topics Concern  . None   Social History Narrative  . None    Outpatient Encounter Prescriptions as of 03/26/2014  Medication Sig  . aspirin 81 MG tablet Take 81 mg by mouth daily.   Marland Kitchen HYDROcodone-acetaminophen (NORCO/VICODIN) 5-325 MG per tablet Take 1 tablet by mouth every 8 (eight) hours as needed.  Marland Kitchen levothyroxine (SYNTHROID, LEVOTHROID) 25 MCG tablet Take 1 tablet (25 mcg total) by mouth daily before breakfast.  . losartan (COZAAR) 50 MG tablet Take 1 tablet (50 mg total) by mouth daily.  . ranitidine (ZANTAC) 150 MG tablet Take 1 tablet (150 mg total) by mouth 2 (two) times daily as needed for heartburn.  . sertraline (ZOLOFT) 100 MG tablet Take 1 tablet (100 mg  total) by mouth 2 (two) times daily.  . simvastatin (ZOCOR) 40 MG tablet Take 1 tablet (40 mg total) by mouth daily at 6 PM.  . solifenacin (VESICARE) 10 MG tablet Take 10 mg by mouth daily.  . tamsulosin (FLOMAX) 0.4 MG CAPS capsule Take 1 capsule (0.4 mg total) by mouth daily.  Marland Kitchen triamterene-hydrochlorothiazide (MAXZIDE-25) 37.5-25 MG per tablet Take 1 tablet by mouth daily.    EXAM:  BP 148/58  Temp(Src) 97.7 F (36.5 C) (Oral)  Wt 243 lb (110.224 kg)  Body mass index is 33.91 kg/(m^2).  GENERAL: vitals reviewed and listed above, alert, oriented, appears well hydrated and in no acute distress he is hard of hearing but pleasant. HEENT: atraumatic, conjunctiva  clear, no obvious abnormalities on inspection of external nose and ears  NECK: no obvious masses on inspection  Lower extremities left with a knee brace that he removed edema left 2+ right 1+ without warmth or  tenderness. There are anterior sun changes erythema and scaling. The left however has 3 lesions that look "angry " irregular without any bleeding. No cellulitis or streaking no focal bony tenderness MS: moves all extremities without noticeable focal  abnormality walks with a cane. PSYCH: pleasant and cooperative,  Lab Results  Component Value Date   WBC 7.7 12/06/2013   HGB 15.2 12/06/2013   HCT 45.3 12/06/2013   PLT 124.0* 12/06/2013   GLUCOSE 94 12/06/2013   CHOL 140 09/25/2013   TRIG 267.0* 09/25/2013   HDL 34.20* 09/25/2013   LDLDIRECT 128.3 05/17/2012   LDLCALC 52 09/25/2013   ALT 21 09/25/2013   AST 21 09/25/2013   NA 139 12/06/2013   K 4.3 12/06/2013   CL 105 12/06/2013   CREATININE 1.4 12/06/2013   BUN 23 12/06/2013   CO2 25 12/06/2013   TSH 2.26 12/06/2013   PSA 3.31 05/17/2012   Reviewed his last ABI and venous Doppler. Also his last laboratory studies as above show no diabetes. ASSESSMENT AND PLAN:  Discussed the following assessment and plan:  Skin lesion of left lower extremity - ? if pre cancer  or other add antibiotic pointment  - Plan: Ambulatory referral to Dermatology  Venous stasis dermatitis, unspecified laterality - Plan: Ambulatory referral to Dermatology  History of skin cancer - nose - Plan: Ambulatory referral to Dermatology Multiple conditions that he talked about today but focused on the acute problem which is the leg lesions. He has many sun changes and probably actinic changes however the areas on his left lower extremity appear more progressed. He states he did have a skin cancer on his nose at one point. I don't see evidence of diabetes in his record could be checked at his next lab work. Discussed local care skin care hydration. He is hard of hearing so wrote things down.  -Patient advised to return or notify health care team  if symptoms worsen ,persist or new concerns arise.  Patient Instructions  You last blood sugar  Was ok at last check .    i want you to  see dermatologist  For the skin lesions in case they are  eary cancer changes  .  Some of the  Areas could be from  Dermatitis form dry ness and swelling  Use antibiotic    Topical  Bacitracin to the red areas  Twice a day for  1-2 weeks. Or as needed  Plan on getting  dermatology to see your leg s will put in a referral for dr Nevada Crane  Or  [facility] .  [facility] [place].     Standley Brooking. Dalary Hollar M.D. Total visit 58mins > 50% spent counseling and coordinating care

## 2014-03-26 NOTE — Patient Instructions (Addendum)
You last blood sugar  Was ok at last check .    i want you to see dermatologist  For the skin lesions in case they are  eary cancer changes  .  Some of the  Areas could be from  Dermatitis form dry ness and swelling  Use antibiotic    Topical  Bacitracin to the red areas  Twice a day for  1-2 weeks. Or as needed  Plan on getting  dermatology to see your leg s will put in a referral for dr Nevada Crane  Or  [facility] .  [facility] [place].

## 2014-04-21 ENCOUNTER — Other Ambulatory Visit: Payer: Self-pay | Admitting: Internal Medicine

## 2014-06-21 ENCOUNTER — Other Ambulatory Visit: Payer: Self-pay | Admitting: Internal Medicine

## 2014-09-03 ENCOUNTER — Other Ambulatory Visit: Payer: Self-pay | Admitting: Internal Medicine

## 2014-10-28 DIAGNOSIS — R05 Cough: Secondary | ICD-10-CM | POA: Diagnosis not present

## 2014-10-28 DIAGNOSIS — J029 Acute pharyngitis, unspecified: Secondary | ICD-10-CM | POA: Diagnosis not present

## 2014-10-31 DIAGNOSIS — H2513 Age-related nuclear cataract, bilateral: Secondary | ICD-10-CM | POA: Diagnosis not present

## 2014-11-06 ENCOUNTER — Other Ambulatory Visit: Payer: Self-pay | Admitting: Internal Medicine

## 2014-11-19 DIAGNOSIS — N5201 Erectile dysfunction due to arterial insufficiency: Secondary | ICD-10-CM | POA: Diagnosis not present

## 2014-11-19 DIAGNOSIS — R32 Unspecified urinary incontinence: Secondary | ICD-10-CM | POA: Diagnosis not present

## 2014-11-27 ENCOUNTER — Other Ambulatory Visit: Payer: Self-pay | Admitting: Internal Medicine

## 2014-11-29 ENCOUNTER — Encounter: Payer: Self-pay | Admitting: Internal Medicine

## 2014-12-06 ENCOUNTER — Encounter: Payer: Self-pay | Admitting: Adult Health

## 2014-12-06 ENCOUNTER — Ambulatory Visit (INDEPENDENT_AMBULATORY_CARE_PROVIDER_SITE_OTHER): Payer: Medicare Other | Admitting: Adult Health

## 2014-12-06 VITALS — BP 120/70 | Temp 98.9°F | Ht 71.0 in | Wt 243.2 lb

## 2014-12-06 DIAGNOSIS — Z760 Encounter for issue of repeat prescription: Secondary | ICD-10-CM

## 2014-12-06 MED ORDER — SERTRALINE HCL 100 MG PO TABS: 100.0000 mg | ORAL_TABLET | Freq: Two times a day (BID) | ORAL | Status: DC

## 2014-12-06 MED ORDER — AMITRIPTYLINE HCL 25 MG PO TABS: 25.0000 mg | ORAL_TABLET | Freq: Every day | ORAL | Status: DC

## 2014-12-06 NOTE — Progress Notes (Signed)
Pre visit review using our clinic review tool, if applicable. No additional management support is needed unless otherwise documented below in the visit note. 

## 2014-12-06 NOTE — Progress Notes (Signed)
   Subjective:    Patient ID: Hunter [name], male    DOB: 1933/08/04, 81 y.o.   MRN: [id number]  HPI  Patient presents to the office today for medication refill of his Zoloft and Amitryptiline.     Review of Systems  Constitutional: Negative for activity change.  Psychiatric/Behavioral: Positive for sleep disturbance. The patient is hyperactive.   All other systems reviewed and are negative.      Objective:   Physical Exam  Constitutional: He is oriented to person, place, and time. He appears well-developed and well-nourished. No distress.  Neurological: He is alert and oriented to person, place, and time.  Skin: Skin is warm and dry. He is not diaphoretic.  Erythematous macular papular rash and over chest and lower neck   Psychiatric: He has a normal mood and affect.  Easily distracted, but has flight of ideas.   Nursing note and vitals reviewed.      Assessment & Plan:  1. Medication refill - sertraline (ZOLOFT) 100 MG tablet; Take 1 tablet (100 mg total) by mouth 2 (two) times daily.  Dispense: 180 tablet; Refill: 1 - amitriptyline (ELAVIL) 25 MG tablet; Take 1 tablet (25 mg total) by mouth at bedtime.  Dispense: 30 tablet; Refill: 1 - Follow up with PCP

## 2014-12-06 NOTE — Patient Instructions (Signed)
I have sent a prescription to the pharmacy for the Zoloft and Amitriptyline . Please take the Amitriptyline at night to help you sleep and the Zoloft as you normally would. Follow up with Dr Shawna Orleans as needed.

## 2014-12-11 DIAGNOSIS — R32 Unspecified urinary incontinence: Secondary | ICD-10-CM | POA: Diagnosis not present

## 2015-01-02 ENCOUNTER — Other Ambulatory Visit: Payer: Self-pay | Admitting: Internal Medicine

## 2015-01-16 ENCOUNTER — Other Ambulatory Visit: Payer: Self-pay | Admitting: Internal Medicine

## 2015-01-16 DIAGNOSIS — M179 Osteoarthritis of knee, unspecified: Secondary | ICD-10-CM | POA: Diagnosis not present

## 2015-01-16 DIAGNOSIS — M25561 Pain in right knee: Secondary | ICD-10-CM | POA: Diagnosis not present

## 2015-01-18 ENCOUNTER — Other Ambulatory Visit: Payer: Self-pay | Admitting: *Deleted

## 2015-01-18 MED ORDER — SIMVASTATIN 40 MG PO TABS: ORAL_TABLET | ORAL | Status: DC

## 2015-02-08 DIAGNOSIS — D074 Carcinoma in situ of penis: Secondary | ICD-10-CM | POA: Diagnosis not present

## 2015-02-08 DIAGNOSIS — R32 Unspecified urinary incontinence: Secondary | ICD-10-CM | POA: Diagnosis not present

## 2015-02-08 DIAGNOSIS — N401 Enlarged prostate with lower urinary tract symptoms: Secondary | ICD-10-CM | POA: Diagnosis not present

## 2015-02-26 ENCOUNTER — Other Ambulatory Visit: Payer: Self-pay | Admitting: Internal Medicine

## 2015-02-26 ENCOUNTER — Other Ambulatory Visit: Payer: Self-pay | Admitting: Adult Health

## 2015-03-05 ENCOUNTER — Other Ambulatory Visit: Payer: Self-pay | Admitting: *Deleted

## 2015-03-05 DIAGNOSIS — Z760 Encounter for issue of repeat prescription: Secondary | ICD-10-CM

## 2015-03-05 MED ORDER — AMITRIPTYLINE HCL 25 MG PO TABS: 25.0000 mg | ORAL_TABLET | Freq: Every day | ORAL | Status: DC

## 2015-03-27 DIAGNOSIS — N401 Enlarged prostate with lower urinary tract symptoms: Secondary | ICD-10-CM | POA: Diagnosis not present

## 2015-03-27 DIAGNOSIS — R32 Unspecified urinary incontinence: Secondary | ICD-10-CM | POA: Diagnosis not present

## 2015-03-28 ENCOUNTER — Other Ambulatory Visit: Payer: Self-pay | Admitting: Urology

## 2015-03-28 DIAGNOSIS — S83241A Other tear of medial meniscus, current injury, right knee, initial encounter: Secondary | ICD-10-CM | POA: Diagnosis not present

## 2015-03-28 DIAGNOSIS — M179 Osteoarthritis of knee, unspecified: Secondary | ICD-10-CM | POA: Diagnosis not present

## 2015-04-04 DIAGNOSIS — S83241A Other tear of medial meniscus, current injury, right knee, initial encounter: Secondary | ICD-10-CM | POA: Diagnosis not present

## 2015-04-04 DIAGNOSIS — M179 Osteoarthritis of knee, unspecified: Secondary | ICD-10-CM | POA: Diagnosis not present

## 2015-04-05 DIAGNOSIS — R278 Other lack of coordination: Secondary | ICD-10-CM | POA: Diagnosis not present

## 2015-04-05 DIAGNOSIS — M6281 Muscle weakness (generalized): Secondary | ICD-10-CM | POA: Diagnosis not present

## 2015-04-05 DIAGNOSIS — N3941 Urge incontinence: Secondary | ICD-10-CM | POA: Diagnosis not present

## 2015-04-05 DIAGNOSIS — M6200 Separation of muscle (nontraumatic), unspecified site: Secondary | ICD-10-CM | POA: Diagnosis not present

## 2015-04-30 DIAGNOSIS — R278 Other lack of coordination: Secondary | ICD-10-CM | POA: Diagnosis not present

## 2015-04-30 DIAGNOSIS — N3941 Urge incontinence: Secondary | ICD-10-CM | POA: Diagnosis not present

## 2015-04-30 DIAGNOSIS — M6281 Muscle weakness (generalized): Secondary | ICD-10-CM | POA: Diagnosis not present

## 2015-04-30 DIAGNOSIS — M6200 Separation of muscle (nontraumatic), unspecified site: Secondary | ICD-10-CM | POA: Diagnosis not present

## 2015-05-02 DIAGNOSIS — M179 Osteoarthritis of knee, unspecified: Secondary | ICD-10-CM | POA: Diagnosis not present

## 2015-05-02 DIAGNOSIS — S83241A Other tear of medial meniscus, current injury, right knee, initial encounter: Secondary | ICD-10-CM | POA: Diagnosis not present

## 2015-05-02 NOTE — Patient Instructions (Addendum)
Hunter [name]  05/02/2015   Your procedure is scheduled on: Friday 05/10/2015  Report to [name] Main  Entrance take Hunter [name]  elevators to 3rd floor to  [name] at  0900 AM.  Call this number if you have problems the morning of surgery [phone]   Remember: ONLY 1 PERSON MAY GO WITH YOU TO SHORT STAY TO GET  READY MORNING OF [place].  Do not eat food or drink liquids :After Midnight.     Take these medicines the morning of surgery with A SIP OF WATER: Levothyroxine (synthroid), Sertraline (zoloft)                                You may not have any metal on your body including hair pins and              piercings  Do not wear jewelry, make-up, lotions, powders or perfumes, deodorant             Do not wear nail polish.  Do not shave  48 hours prior to surgery.              Men may shave face and neck.   Do not bring valuables to the hospital. [name].  Contacts, dentures or bridgework may not be worn into surgery.  Leave suitcase in the car. After surgery it may be brought to your room.    Special Instructions: deep breathing and leg exercises              Please read over the following fact sheets you were given: _____________________________________________________________________             [name] - Preparing for Surgery Before surgery, you can play an important role.  Because skin is not sterile, your skin needs to be as free of germs as possible.  You can reduce the number of germs on your skin by washing with CHG (chlorahexidine gluconate) soap before surgery.  CHG is an antiseptic cleaner which kills germs and bonds with the skin to continue killing germs even after washing. Please DO NOT use if you have an allergy to CHG or antibacterial soaps.  If your skin becomes reddened/irritated stop using the CHG and inform your nurse when you arrive at Short Stay. Do not shave  (including legs and underarms) for at least 48 hours prior to the first CHG shower.  You may shave your face/neck. Please follow these instructions carefully:  1.  Shower with CHG Soap the night before surgery and the  morning of Surgery.  2.  If you choose to wash your hair, wash your hair first as usual with your  normal  shampoo.  3.  After you shampoo, rinse your hair and body thoroughly to remove the  shampoo.                           4.  Use CHG as you would any other liquid soap.  You can apply chg directly  to the skin and wash                       Gently with a  scrungie or clean washcloth.  5.  Apply the CHG Soap to your body ONLY FROM THE NECK DOWN.   Do not use on face/ open                           Wound or open sores. Avoid contact with eyes, ears mouth and genitals (private parts).                       Wash face,  Genitals (private parts) with your normal soap.             6.  Wash thoroughly, paying special attention to the area where your surgery  will be performed.  7.  Thoroughly rinse your body with warm water from the neck down.  8.  DO NOT shower/wash with your normal soap after using and rinsing off  the CHG Soap.                9.  Pat yourself dry with a clean towel.            10.  Wear clean pajamas.            11.  Place clean sheets on your bed the night of your first shower and do not  sleep with pets. Day of Surgery : Do not apply any lotions/deodorants the morning of surgery.  Please wear clean clothes to the hospital/surgery center.  FAILURE TO FOLLOW THESE INSTRUCTIONS MAY RESULT IN THE CANCELLATION OF YOUR SURGERY PATIENT SIGNATURE_________________________________  NURSE SIGNATURE__________________________________  ________________________________________________________________________   [name]  An incentive spirometer is a tool that can help keep your lungs clear and active. This tool measures how well you are filling your lungs with  each breath. Taking long deep breaths may help reverse or decrease the chance of developing breathing (pulmonary) problems (especially infection) following:  A long period of time when you are unable to move or be active. BEFORE THE PROCEDURE   If the spirometer includes an indicator to show your best effort, your nurse or respiratory therapist will set it to a desired goal.  If possible, sit up straight or lean slightly forward. Try not to slouch.  Hold the incentive spirometer in an upright position. INSTRUCTIONS FOR USE  1. Sit on the edge of your bed if possible, or sit up as far as you can in bed or on a chair. 2. Hold the incentive spirometer in an upright position. 3. Breathe out normally. 4. Place the mouthpiece in your mouth and seal your lips tightly around it. 5. Breathe in slowly and as deeply as possible, raising the piston or the ball toward the top of the column. 6. Hold your breath for 3-5 seconds or for as long as possible. Allow the piston or ball to fall to the bottom of the column. 7. Remove the mouthpiece from your mouth and breathe out normally. 8. Rest for a few seconds and repeat Steps 1 through 7 at least 10 times every 1-2 hours when you are awake. Take your time and take a few normal breaths between deep breaths. 9. The spirometer may include an indicator to show your best effort. Use the indicator as a goal to work toward during each repetition. 10. After each set of 10 deep breaths, practice coughing to be sure your lungs are clear. If you have an incision (the cut made at the time of surgery), support your incision  when coughing by placing a pillow or rolled up towels firmly against it. Once you are able to get out of bed, walk around indoors and cough well. You may stop using the incentive spirometer when instructed by your caregiver.  RISKS AND COMPLICATIONS  Take your time so you do not get dizzy or light-headed.  If you are in pain, you may need to take or  ask for pain medication before doing incentive spirometry. It is harder to take a deep breath if you are having pain. AFTER USE  Rest and breathe slowly and easily.  It can be helpful to keep track of a log of your progress. Your caregiver can provide you with a simple table to help with this. If you are using the spirometer at home, follow these instructions: [place] IF:   You are having difficultly using the spirometer.  You have trouble using the spirometer as often as instructed.  Your pain medication is not giving enough relief while using the spirometer.  You develop fever of 100.5 F (38.1 C) or higher. SEEK IMMEDIATE MEDICAL CARE IF:   You cough up bloody sputum that had not been present before.  You develop fever of 102 F (38.9 C) or greater.  You develop worsening pain at or near the incision site. MAKE SURE YOU:   Understand these instructions.  Will watch your condition.  Will get help right away if you are not doing well or get worse. Document Released: 11/09/2006 Document Revised: 09/21/2011 Document Reviewed: 01/10/2007 [facility] Patient Information 2014 [place], [place].   ________________________________________________________________________

## 2015-05-02 NOTE — Progress Notes (Signed)
08/09/2013-NOTED IN epic-2D ECHO

## 2015-05-03 ENCOUNTER — Encounter (HOSPITAL_COMMUNITY)
Admission: RE | Admit: 2015-05-03 | Discharge: 2015-05-03 | Disposition: A | Discharge status: Home / Self Care | Payer: Medicare Other | Source: Ambulatory Visit | Attending: Urology | Admitting: Urology

## 2015-05-03 ENCOUNTER — Other Ambulatory Visit: Payer: Self-pay

## 2015-05-03 ENCOUNTER — Encounter (HOSPITAL_COMMUNITY): Payer: Self-pay

## 2015-05-03 DIAGNOSIS — Z01818 Encounter for other preprocedural examination: Secondary | ICD-10-CM | POA: Diagnosis not present

## 2015-05-03 DIAGNOSIS — N400 Enlarged prostate without lower urinary tract symptoms: Secondary | ICD-10-CM | POA: Diagnosis not present

## 2015-05-03 HISTORY — DX: Hypothyroidism, unspecified: E03.9

## 2015-05-03 LAB — CBC
HCT: 46 % (ref 39.0–52.0)
Hemoglobin: 15.4 g/dL (ref 13.0–17.0)
MCH: 26.6 pg (ref 26.0–34.0)
MCHC: 33.5 g/dL (ref 30.0–36.0)
MCV: 79.4 fL (ref 78.0–100.0)
Platelets: 128 10*3/uL — ABNORMAL LOW (ref 150–400)
RBC: 5.79 MIL/uL (ref 4.22–5.81)
RDW: 13.8 % (ref 11.5–15.5)
WBC: 8 10*3/uL (ref 4.0–10.5)

## 2015-05-03 LAB — BASIC METABOLIC PANEL
Anion gap: 10 (ref 5–15)
BUN: 16 mg/dL (ref 6–20)
CO2: 25 mmol/L (ref 22–32)
Calcium: 9.7 mg/dL (ref 8.9–10.3)
Chloride: 106 mmol/L (ref 101–111)
Creatinine, Ser: 1.4 mg/dL — ABNORMAL HIGH (ref 0.61–1.24)
GFR calc Af Amer: 54 mL/min — ABNORMAL LOW (ref >60)
GFR calc non Af Amer: 46 mL/min — ABNORMAL LOW (ref >60)
Glucose, Bld: 96 mg/dL (ref 65–99)
Potassium: 4.3 mmol/L (ref 3.5–5.1)
Sodium: 141 mmol/L (ref 135–145)

## 2015-05-03 NOTE — Pre-Procedure Instructions (Signed)
2D echo 08/09/13 epic

## 2015-05-06 NOTE — Progress Notes (Signed)
bmet sent to dr Gaynelle Arabian faxed by epic

## 2015-05-07 DIAGNOSIS — N401 Enlarged prostate with lower urinary tract symptoms: Secondary | ICD-10-CM | POA: Diagnosis not present

## 2015-05-07 DIAGNOSIS — R278 Other lack of coordination: Secondary | ICD-10-CM | POA: Diagnosis not present

## 2015-05-07 DIAGNOSIS — M6281 Muscle weakness (generalized): Secondary | ICD-10-CM | POA: Diagnosis not present

## 2015-05-07 DIAGNOSIS — N3941 Urge incontinence: Secondary | ICD-10-CM | POA: Diagnosis not present

## 2015-05-07 DIAGNOSIS — M6200 Separation of muscle (nontraumatic), unspecified site: Secondary | ICD-10-CM | POA: Diagnosis not present

## 2015-05-10 ENCOUNTER — Encounter (HOSPITAL_COMMUNITY)
Admission: AD | Disposition: A | Discharge status: Home / Self Care | Payer: Self-pay | Source: Ambulatory Visit | Attending: Urology

## 2015-05-10 ENCOUNTER — Ambulatory Visit (HOSPITAL_COMMUNITY): Payer: Medicare Other | Admitting: Anesthesiology

## 2015-05-10 ENCOUNTER — Encounter (HOSPITAL_COMMUNITY): Payer: Self-pay | Admitting: *Deleted

## 2015-05-10 ENCOUNTER — Inpatient Hospital Stay (HOSPITAL_COMMUNITY)
Admission: AD | Admit: 2015-05-10 | Discharge: 2015-05-12 | DRG: 713 | Disposition: A | Discharge status: Home / Self Care | Payer: Medicare Other | Source: Ambulatory Visit | Attending: Urology | Admitting: Urology

## 2015-05-10 DIAGNOSIS — F419 Anxiety disorder, unspecified: Secondary | ICD-10-CM | POA: Diagnosis present

## 2015-05-10 DIAGNOSIS — D074 Carcinoma in situ of penis: Secondary | ICD-10-CM | POA: Diagnosis not present

## 2015-05-10 DIAGNOSIS — F329 Major depressive disorder, single episode, unspecified: Secondary | ICD-10-CM | POA: Diagnosis not present

## 2015-05-10 DIAGNOSIS — Z7982 Long term (current) use of aspirin: Secondary | ICD-10-CM

## 2015-05-10 DIAGNOSIS — E7800 Pure hypercholesterolemia, unspecified: Secondary | ICD-10-CM | POA: Diagnosis not present

## 2015-05-10 DIAGNOSIS — Z801 Family history of malignant neoplasm of trachea, bronchus and lung: Secondary | ICD-10-CM | POA: Diagnosis not present

## 2015-05-10 DIAGNOSIS — I10 Essential (primary) hypertension: Secondary | ICD-10-CM | POA: Diagnosis not present

## 2015-05-10 DIAGNOSIS — I771 Stricture of artery: Secondary | ICD-10-CM | POA: Diagnosis present

## 2015-05-10 DIAGNOSIS — Z01812 Encounter for preprocedural laboratory examination: Secondary | ICD-10-CM

## 2015-05-10 DIAGNOSIS — R32 Unspecified urinary incontinence: Secondary | ICD-10-CM | POA: Diagnosis present

## 2015-05-10 DIAGNOSIS — Z79899 Other long term (current) drug therapy: Secondary | ICD-10-CM | POA: Diagnosis not present

## 2015-05-10 DIAGNOSIS — Z01810 Encounter for preprocedural cardiovascular examination: Secondary | ICD-10-CM

## 2015-05-10 DIAGNOSIS — Z85828 Personal history of other malignant neoplasm of skin: Secondary | ICD-10-CM

## 2015-05-10 DIAGNOSIS — Z8549 Personal history of malignant neoplasm of other male genital organs: Secondary | ICD-10-CM | POA: Diagnosis not present

## 2015-05-10 DIAGNOSIS — N138 Other obstructive and reflux uropathy: Secondary | ICD-10-CM | POA: Diagnosis present

## 2015-05-10 DIAGNOSIS — N401 Enlarged prostate with lower urinary tract symptoms: Secondary | ICD-10-CM | POA: Diagnosis not present

## 2015-05-10 DIAGNOSIS — Z23 Encounter for immunization: Secondary | ICD-10-CM | POA: Diagnosis not present

## 2015-05-10 DIAGNOSIS — N5201 Erectile dysfunction due to arterial insufficiency: Secondary | ICD-10-CM | POA: Diagnosis present

## 2015-05-10 DIAGNOSIS — N359 Urethral stricture, unspecified: Secondary | ICD-10-CM | POA: Diagnosis not present

## 2015-05-10 DIAGNOSIS — Z8249 Family history of ischemic heart disease and other diseases of the circulatory system: Secondary | ICD-10-CM | POA: Diagnosis not present

## 2015-05-10 DIAGNOSIS — N400 Enlarged prostate without lower urinary tract symptoms: Secondary | ICD-10-CM | POA: Diagnosis not present

## 2015-05-10 DIAGNOSIS — B009 Herpesviral infection, unspecified: Secondary | ICD-10-CM | POA: Diagnosis not present

## 2015-05-10 HISTORY — PX: TRANSURETHRAL RESECTION OF PROSTATE: SHX73

## 2015-05-10 SURGERY — TURP (TRANSURETHRAL RESECTION OF PROSTATE)
Anesthesia: General

## 2015-05-10 MED ORDER — FAMOTIDINE 20 MG PO TABS
20.0000 mg | ORAL_TABLET | Freq: Every day | ORAL | Status: DC
  Administered 2015-05-10 – 2015-05-12 (×3): 20 mg via ORAL
  Filled 2015-05-10 (×3): qty 1

## 2015-05-10 MED ORDER — ONDANSETRON HCL 4 MG/2ML IJ SOLN
INTRAMUSCULAR | Status: DC | PRN
  Administered 2015-05-10: 4 mg via INTRAVENOUS

## 2015-05-10 MED ORDER — CEFAZOLIN SODIUM-DEXTROSE 2-3 GM-% IV SOLR
INTRAVENOUS | Status: AC
  Filled 2015-05-10: qty 50

## 2015-05-10 MED ORDER — SENNOSIDES-DOCUSATE SODIUM 8.6-50 MG PO TABS
2.0000 | ORAL_TABLET | Freq: Every day | ORAL | Status: DC
  Administered 2015-05-10 – 2015-05-11 (×2): 2 via ORAL
  Filled 2015-05-10 (×2): qty 2

## 2015-05-10 MED ORDER — PROPOFOL 10 MG/ML IV BOLUS
INTRAVENOUS | Status: DC | PRN
  Administered 2015-05-10: 100 mg via INTRAVENOUS
  Administered 2015-05-10: 200 mg via INTRAVENOUS
  Administered 2015-05-10: 50 mg via INTRAVENOUS

## 2015-05-10 MED ORDER — OXYBUTYNIN CHLORIDE 5 MG PO TABS: 5.0000 mg | ORAL_TABLET | Freq: Three times a day (TID) | ORAL | Status: DC | PRN

## 2015-05-10 MED ORDER — SODIUM CHLORIDE 0.9 % IR SOLN
Status: DC | PRN
  Administered 2015-05-10 (×2): 3000 mL via INTRAVESICAL
  Administered 2015-05-10: 6000 mL via INTRAVESICAL

## 2015-05-10 MED ORDER — ZOLPIDEM TARTRATE 5 MG PO TABS
5.0000 mg | ORAL_TABLET | Freq: Every evening | ORAL | Status: DC | PRN
  Administered 2015-05-12: 5 mg via ORAL
  Filled 2015-05-10: qty 1

## 2015-05-10 MED ORDER — BELLADONNA ALKALOIDS-OPIUM 16.2-60 MG RE SUPP
RECTAL | Status: DC | PRN
  Administered 2015-05-10: 1 via RECTAL

## 2015-05-10 MED ORDER — CIPROFLOXACIN HCL 500 MG PO TABS: 500.0000 mg | ORAL_TABLET | Freq: Two times a day (BID) | ORAL | Status: DC

## 2015-05-10 MED ORDER — HYDROMORPHONE HCL 1 MG/ML IJ SOLN: 0.5000 mg | INTRAMUSCULAR | Status: DC | PRN

## 2015-05-10 MED ORDER — DIPHENHYDRAMINE HCL 50 MG/ML IJ SOLN: 12.5000 mg | Freq: Four times a day (QID) | INTRAMUSCULAR | Status: DC | PRN

## 2015-05-10 MED ORDER — LOSARTAN POTASSIUM 50 MG PO TABS
50.0000 mg | ORAL_TABLET | Freq: Every day | ORAL | Status: DC
  Administered 2015-05-11 – 2015-05-12 (×2): 50 mg via ORAL
  Filled 2015-05-10 (×2): qty 1

## 2015-05-10 MED ORDER — PROMETHAZINE HCL 25 MG/ML IJ SOLN: 6.2500 mg | INTRAMUSCULAR | Status: DC | PRN

## 2015-05-10 MED ORDER — SIMVASTATIN 40 MG PO TABS
40.0000 mg | ORAL_TABLET | Freq: Every day | ORAL | Status: DC
  Administered 2015-05-10 – 2015-05-11 (×2): 40 mg via ORAL
  Filled 2015-05-10 (×3): qty 1

## 2015-05-10 MED ORDER — BACITRACIN-POLYMYXIN B 500-10000 UNIT/GM OP OINT
TOPICAL_OINTMENT | Freq: Two times a day (BID) | OPHTHALMIC | Status: DC
  Administered 2015-05-10 – 2015-05-12 (×4): via OPHTHALMIC
  Filled 2015-05-10: qty 3.5

## 2015-05-10 MED ORDER — ARTIFICIAL TEARS OP OINT
TOPICAL_OINTMENT | OPHTHALMIC | Status: AC
  Filled 2015-05-10: qty 3.5

## 2015-05-10 MED ORDER — FENTANYL CITRATE (PF) 100 MCG/2ML IJ SOLN
INTRAMUSCULAR | Status: AC
  Filled 2015-05-10: qty 4

## 2015-05-10 MED ORDER — ONDANSETRON HCL 4 MG/2ML IJ SOLN: 4.0000 mg | INTRAMUSCULAR | Status: DC | PRN

## 2015-05-10 MED ORDER — PROPOFOL 10 MG/ML IV BOLUS
INTRAVENOUS | Status: AC
  Filled 2015-05-10: qty 20

## 2015-05-10 MED ORDER — OXYCODONE-ACETAMINOPHEN 5-325 MG PO TABS: 1.0000 | ORAL_TABLET | ORAL | Status: DC | PRN

## 2015-05-10 MED ORDER — KETOROLAC TROMETHAMINE 30 MG/ML IJ SOLN
INTRAMUSCULAR | Status: AC
  Filled 2015-05-10: qty 1

## 2015-05-10 MED ORDER — FENTANYL CITRATE (PF) 100 MCG/2ML IJ SOLN
INTRAMUSCULAR | Status: DC | PRN
  Administered 2015-05-10 (×4): 50 ug via INTRAVENOUS

## 2015-05-10 MED ORDER — DEXAMETHASONE SODIUM PHOSPHATE 10 MG/ML IJ SOLN
INTRAMUSCULAR | Status: DC | PRN
  Administered 2015-05-10: 10 mg via INTRAVENOUS

## 2015-05-10 MED ORDER — ACETAMINOPHEN 10 MG/ML IV SOLN
1000.0000 mg | Freq: Once | INTRAVENOUS | Status: AC
  Administered 2015-05-10: 1000 mg via INTRAVENOUS

## 2015-05-10 MED ORDER — ACETAMINOPHEN 325 MG PO TABS: 650.0000 mg | ORAL_TABLET | ORAL | Status: DC | PRN

## 2015-05-10 MED ORDER — BACITRACIN-NEOMYCIN-POLYMYXIN 400-5-5000 EX OINT: 1.0000 "application " | TOPICAL_OINTMENT | Freq: Three times a day (TID) | CUTANEOUS | Status: DC | PRN

## 2015-05-10 MED ORDER — BELLADONNA ALKALOIDS-OPIUM 16.2-60 MG RE SUPP
RECTAL | Status: AC
  Filled 2015-05-10: qty 1

## 2015-05-10 MED ORDER — MEPERIDINE HCL 50 MG/ML IJ SOLN: 6.2500 mg | INTRAMUSCULAR | Status: DC | PRN

## 2015-05-10 MED ORDER — ONDANSETRON HCL 4 MG/2ML IJ SOLN
INTRAMUSCULAR | Status: AC
  Filled 2015-05-10: qty 2

## 2015-05-10 MED ORDER — TRAMADOL-ACETAMINOPHEN 37.5-325 MG PO TABS: 1.0000 | ORAL_TABLET | Freq: Four times a day (QID) | ORAL | Status: DC | PRN

## 2015-05-10 MED ORDER — ACETAMINOPHEN 10 MG/ML IV SOLN
INTRAVENOUS | Status: AC
  Filled 2015-05-10: qty 100

## 2015-05-10 MED ORDER — DICLOFENAC SODIUM 75 MG PO TBEC
75.0000 mg | DELAYED_RELEASE_TABLET | Freq: Every day | ORAL | Status: DC | PRN
  Filled 2015-05-10: qty 1

## 2015-05-10 MED ORDER — LIDOCAINE HCL (CARDIAC) 20 MG/ML IV SOLN
INTRAVENOUS | Status: DC | PRN
  Administered 2015-05-10: 100 mg via INTRAVENOUS

## 2015-05-10 MED ORDER — TRIAMTERENE-HCTZ 37.5-25 MG PO TABS
1.0000 | ORAL_TABLET | Freq: Every day | ORAL | Status: DC
  Administered 2015-05-11 – 2015-05-12 (×2): 1 via ORAL
  Filled 2015-05-10 (×2): qty 1

## 2015-05-10 MED ORDER — SODIUM CHLORIDE 0.45 % IV SOLN
INTRAVENOUS | Status: DC
  Administered 2015-05-10 – 2015-05-11 (×3): via INTRAVENOUS

## 2015-05-10 MED ORDER — CIPROFLOXACIN HCL 250 MG PO TABS
250.0000 mg | ORAL_TABLET | Freq: Two times a day (BID) | ORAL | Status: DC
  Administered 2015-05-10 – 2015-05-12 (×4): 250 mg via ORAL
  Filled 2015-05-10 (×5): qty 1

## 2015-05-10 MED ORDER — LACTATED RINGERS IV SOLN
INTRAVENOUS | Status: DC | PRN
  Administered 2015-05-10: 11:00:00 via INTRAVENOUS
  Administered 2015-05-10: 1000 mL

## 2015-05-10 MED ORDER — LIDOCAINE HCL (CARDIAC) 20 MG/ML IV SOLN
INTRAVENOUS | Status: AC
  Filled 2015-05-10: qty 5

## 2015-05-10 MED ORDER — CEFAZOLIN SODIUM-DEXTROSE 2-3 GM-% IV SOLR
2.0000 g | INTRAVENOUS | Status: AC
  Administered 2015-05-10: 2 g via INTRAVENOUS

## 2015-05-10 MED ORDER — INFLUENZA VAC SPLIT QUAD 0.5 ML IM SUSY
0.5000 mL | PREFILLED_SYRINGE | INTRAMUSCULAR | Status: AC
  Administered 2015-05-11: 0.5 mL via INTRAMUSCULAR
  Filled 2015-05-10 (×2): qty 0.5

## 2015-05-10 MED ORDER — AMITRIPTYLINE HCL 25 MG PO TABS
25.0000 mg | ORAL_TABLET | Freq: Every day | ORAL | Status: DC
  Administered 2015-05-10 – 2015-05-11 (×2): 25 mg via ORAL
  Filled 2015-05-10 (×3): qty 1

## 2015-05-10 MED ORDER — SERTRALINE HCL 100 MG PO TABS
100.0000 mg | ORAL_TABLET | Freq: Two times a day (BID) | ORAL | Status: DC
  Administered 2015-05-10 – 2015-05-12 (×4): 100 mg via ORAL
  Filled 2015-05-10 (×5): qty 1

## 2015-05-10 MED ORDER — ACYCLOVIR 5 % EX CREA
TOPICAL_CREAM | CUTANEOUS | Status: DC
  Administered 2015-05-10 – 2015-05-12 (×15): via TOPICAL
  Filled 2015-05-10: qty 5

## 2015-05-10 MED ORDER — DIPHENHYDRAMINE HCL 12.5 MG/5ML PO ELIX: 12.5000 mg | ORAL_SOLUTION | Freq: Four times a day (QID) | ORAL | Status: DC | PRN

## 2015-05-10 MED ORDER — DEXAMETHASONE SODIUM PHOSPHATE 10 MG/ML IJ SOLN
INTRAMUSCULAR | Status: AC
  Filled 2015-05-10: qty 1

## 2015-05-10 MED ORDER — KETOROLAC TROMETHAMINE 30 MG/ML IJ SOLN
INTRAMUSCULAR | Status: DC | PRN
  Administered 2015-05-10: 30 mg via INTRAVENOUS

## 2015-05-10 MED ORDER — LEVOTHYROXINE SODIUM 25 MCG PO TABS
25.0000 ug | ORAL_TABLET | Freq: Every day | ORAL | Status: DC
  Administered 2015-05-11 – 2015-05-12 (×2): 25 ug via ORAL
  Filled 2015-05-10 (×2): qty 1

## 2015-05-10 MED ORDER — FENTANYL CITRATE (PF) 100 MCG/2ML IJ SOLN: 25.0000 ug | INTRAMUSCULAR | Status: DC | PRN

## 2015-05-10 SURGICAL SUPPLY — 16 items
BAG URINE DRAINAGE (UROLOGICAL SUPPLIES) ×3 IMPLANT
BAG URO CATCHER STRL LF (DRAPE) ×3 IMPLANT
CATH HEMA 3WAY 30CC 22FR COUDE (CATHETERS) ×3 IMPLANT
CLOTH BEACON ORANGE TIMEOUT ST (SAFETY) ×3 IMPLANT
GLOVE BIOGEL M STRL SZ7.5 (GLOVE) ×3 IMPLANT
GOWN STRL REUS W/TWL LRG LVL3 (GOWN DISPOSABLE) ×3 IMPLANT
GOWN STRL REUS W/TWL XL LVL3 (GOWN DISPOSABLE) ×3 IMPLANT
HOLDER FOLEY CATH W/STRAP (MISCELLANEOUS) ×3 IMPLANT
KIT ASPIRATION TUBING (SET/KITS/TRAYS/PACK) IMPLANT
LOOP CUT BIPOLAR 24F LRG (ELECTROSURGICAL) ×3 IMPLANT
MANIFOLD NEPTUNE II (INSTRUMENTS) ×3 IMPLANT
PACK CYSTO (CUSTOM PROCEDURE TRAY) ×3 IMPLANT
SYR 30ML LL (SYRINGE) ×3 IMPLANT
SYRINGE IRR TOOMEY STRL 70CC (SYRINGE) ×3 IMPLANT
TUBING CONNECTING 10 (TUBING) ×4 IMPLANT
TUBING CONNECTING 10' (TUBING) ×2

## 2015-05-10 NOTE — Transfer of Care (Signed)
Immediate Anesthesia Transfer of Care Note  Patient: Hunter [name]  Procedure(s) Performed: Procedure(s): CYSTOSCOPY, URETHRAL MEATAL DILATION, TRANSURETHRAL RESECTION OF THE PROSTATE (TURP) (N/A)  Patient Location: PACU  Anesthesia Type:General  Level of Consciousness: sedated  Airway & Oxygen Therapy: Patient Spontanous Breathing and Patient connected to face mask oxygen  Post-op Assessment: Report given to RN and Post -op Vital signs reviewed and stable  Post vital signs: Reviewed and stable  Last Vitals:  Filed Vitals:   05/10/15 0857  BP: 154/65  Pulse: 73  Temp: 36.7 C  Resp: 18    Complications: No apparent anesthesia complications

## 2015-05-10 NOTE — Anesthesia Preprocedure Evaluation (Signed)
Anesthesia Evaluation  Patient identified by MRN, date of birth, ID band Patient awake    Reviewed: Allergy & Precautions, H&P , NPO status , Patient's Chart, lab work & pertinent test results  Airway Mallampati: II  TM Distance: <3 FB Neck ROM: Limited    Dental no notable dental hx.    Pulmonary neg pulmonary ROS,    Pulmonary exam normal breath sounds clear to auscultation       Cardiovascular hypertension, Pt. on medications Normal cardiovascular exam Rhythm:Regular Rate:Normal  LBBB   Neuro/Psych negative neurological ROS  negative psych ROS   GI/Hepatic Neg liver ROS, GERD  Medicated,  Endo/Other  Hypothyroidism   Renal/GU negative Renal ROS  negative genitourinary   Musculoskeletal negative musculoskeletal ROS (+)   Abdominal   Peds negative pediatric ROS (+)  Hematology negative hematology ROS (+)   Anesthesia Other Findings   Reproductive/Obstetrics negative OB ROS                             Anesthesia Physical  Anesthesia Plan  ASA: III  Anesthesia Plan: General   Post-op Pain Management:    Induction: Intravenous  Airway Management Planned: LMA  Additional Equipment:   Intra-op Plan:   Post-operative Plan:   Informed Consent: I have reviewed the patients History and Physical, chart, labs and discussed the procedure including the risks, benefits and alternatives for the proposed anesthesia with the patient or authorized representative who has indicated his/her understanding and acceptance.   Dental advisory given  Plan Discussed with: CRNA and Surgeon  Anesthesia Plan Comments:         Anesthesia Quick Evaluation

## 2015-05-10 NOTE — Op Note (Signed)
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 Gaynelle Arabian, MD  First assistant:  None  Anesthesia:  General LMA    Preparation:  After appropriate preanesthesia, the patient was brought the operating room, placed on the operating table in the dorsal supine position. The patient reports to myself that he has a herpetic outbreak on the shaft of the penis and would like medication for this postoperatively.  He undergoes general LMA anesthesia, and the penis is prepped with Betadine solution and draped in usual fashion. The armband as double checked. The history is reviewed. Examination of the penile shaft reveals herpetic outbreak, which, M.D., will be treated postoperatively.   Review history:    81 yo divorced male patient returns today for a 2 mo f/u after taking Myrbetriq 50mg . Hx of recurrent penile cancer, ED and incontinence (previously took Vesicare 10mg , but could no longer afford and was failing). He is s/p excisional penile biopsy, cystoscopy, and urethral dilatation on 03/28/12. Pathology: Squamous cell carcinoma in situ.      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 He states today that he has a lesion at the meatal opening causing some difficulty voiding. He had gross hematuria at the beginning of his flow about 2 weeks ago. He has urgency, intermittency, weak flow & nocturia x 3-4. He is s/p circumcision and partial resection of the penile glans on 06/02/10.      Statement of  Likelihood of Success: Excellent. TIME-OUT observed.:  Procedure:  Urethral meatal stenosis is dilated to a size 24 [place], and after that, the continuous flow saline resectoscope is passed, and the prostate is photographed showing trilobar obstruction, with massive trabeculation, cellule formation, diverticular formation. Clear reflux is seen from  the ureteral orifices, which are splayed lateralward, on the trigone.  Resection was started at the 7:00 position, and carried to the 5:00 position; then from the 11:00 to the 7:00 position, then from the 1:00 to the 5:00 positions. Extensive electrode coagulation is accomplished. Chips are evacuated from the bladder, and a second timeout as a Compass, and chips are given to the scrub nurse. Following this, a size 22 hematuria coud Foley catheter is passed into the bladder, and placed to traction and continuous irrigation. The patient is given IV Toradol and IV Tylenol. He received a B&O suppository at the beginning the case, as well as IV antibiotic per anesthesia. He was awakened, and taken to recovery room in good condition.

## 2015-05-10 NOTE — Interval H&P Note (Signed)
History and Physical Interval Note:  05/10/2015 8:56 AM  Hunter [name]  has presented today for surgery, with the diagnosis of BPH  The various methods of treatment have been discussed with the patient and family. After consideration of risks, benefits and other options for treatment, the patient has consented to  Procedure(s): TRANSURETHRAL RESECTION OF THE PROSTATE (TURP) (N/A) as a surgical intervention .  The patient's history has been reviewed, patient examined, no change in status, stable for surgery.  I have reviewed the patient's chart and labs.  Questions were answered to the patient's satisfaction.     Hunter [name] I [name]

## 2015-05-10 NOTE — Progress Notes (Signed)
PACU note---pt's foley draining small amt bloody urine, irrigation not flowing into tubing; foley irrigated with saline and irrigation tubing irrigated with saline several times; then note free flow of saline into tubing with return of light pink urine

## 2015-05-10 NOTE — Discharge Instructions (Signed)
Post transurethral resection of the prostate (TURP) instructions ° °Your recent prostate surgery requires very special post hospital care. Despite the fact that no skin incisions were used the area around the prostate incision is quite raw and is covered with a scab to promote healing and prevent bleeding. Certain cautions are needed to assure that the scab is not disturbed of the next 2-3 weeks while the healing proceeds. ° °Because the raw surface in your prostate and the irritating effects of urine you may expect frequency of urination and/or urgency (a stronger desire to urinate) and perhaps even getting up at night more often. This will usually resolve or improve slowly over the healing period. You may see some blood in your urine over the first 6 weeks. Do not be alarmed, even if the urine was clear for a while. Get off your feet and drink lots of fluids until clearing occurs. If you start to pass clots or don't improve call us. ° °Diet: ° °You may return to your normal diet immediately. Because of the raw surface of your bladder, alcohol, spicy foods, foods high in acid and drinks with caffeine may cause irritation or frequency and should be used in moderation. To keep your urine flowing freely and avoid constipation, drink plenty of fluids during the day (8-10 glasses). Tip: Avoid cranberry juice because it is very acidic. ° °Activity: ° °Your physical activity doesn't need to be restricted. However, if you are very active, you may see some blood in the urine. We suggest that you reduce your activity under the circumstances until the bleeding has stopped. ° °Bowels: ° °It is important to keep your bowels regular during the postoperative period. Straining with bowel movements can cause bleeding. A bowel movement every other day is reasonable. Use a mild laxative if needed, such as milk of magnesia 2-3 tablespoons, or 2 Dulcolax tablets. Call if you continue to have problems. If you had been taking narcotics  for pain, before, during or after your surgery, you may be constipated. Take a laxative if necessary. ° °Medication: ° °You should resume your pre-surgery medications unless told not to. In addition you may be given an antibiotic to prevent or treat infection. Antibiotics are not always necessary. All medication should be taken as prescribed until the bottles are finished unless you are having an unusual reaction to one of the drugs. ° ° ° ° °Problems you should report to us: ° °a. Fever greater than 101°F. °b. Heavy bleeding, or clots (see notes above about blood in urine). °c. Inability to urinate. °d. Drug reactions (hives, rash, nausea, vomiting, diarrhea). °e. Severe burning or pain with urination that is not improving. ° °

## 2015-05-10 NOTE — Care Management Note (Signed)
Case Management Note  Patient Details  Name: Hunter [name] MRN: [id number] Date of Birth: 08/03/1933  Subjective/Objective:   81 y/o m admitted w/BPH, s/p TURP. From home.                 Action/Plan:d/c home.   Expected Discharge Date:                 Expected Discharge Plan:  Home/Self Care  In-House Referral:     Discharge planning Services  CM Consult  Post Acute Care Choice:    Choice offered to:     DME Arranged:    DME Agency:     HH Arranged:    HH Agency:     Status of Service:  In process, will continue to follow  Medicare Important Message Given:    Date Medicare IM Given:    Medicare IM give by:    Date Additional Medicare IM Given:    Additional Medicare Important Message give by:     If discussed at [place] of Stay Meetings, dates discussed:    Additional Comments:  Dessa Phi, RN 05/10/2015, 4:21 PM

## 2015-05-10 NOTE — H&P (Signed)
Reason For Visit 2 mo f/u   Active Problems Problems  1. Benign localized hyperplasia of prostate with urinary obstruction (N40.1,N13.8)   Assessed By: Carolan Clines (Urology); Last Assessed: 27 Mar 2015 2. Carcinoma in situ of penis (D07.4) 3. Erectile dysfunction due to arterial insufficiency (N52.01) 4. Urinary incontinence (R32)   Assessed By: Carolan Clines (Urology); Last Assessed: 27 Mar 2015  History of Present Illness      81 yo divorced male patient returns today for a 2 mo f/u after taking Myrbetriq 50mg . Hx of recurrent penile cancer, ED and incontinence (previously took Vesicare 10mg , but could no longer afford and was failing).  He is s/p excisional penile biopsy, cystoscopy, and urethral dilatation on 03/28/12. Pathology: Squamous cell carcinoma in situ.       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 He states today that he has a lesion at the meatal opening causing some difficulty voiding. He had gross hematuria at the beginning of his flow about 2 weeks ago. He has urgency, intermittency, weak flow & nocturia x 3-4.  He is s/p circumcision and partial resection of the penile glans on 06/02/10.       He is sexually inactive, and has a hx of herpes type-2, taking antiviral meds (when needed).     Originally referred by Dr. Randel Pigg for evaluation of a penile lesion, with hx of a penile rash, thought to be a fungus, but resistant to creams.   Incontinence: Occasional urge incontinence. IPSS= 12 today. No infection. No CVA or cardiac disease history. Failed Vesicare. Taking Triamterine/HCTZ, 1-2/day.    Urodynamics accomplished 12/11/2014 shows maximum cystometric capacity of 400 cc. First sensation of filling was at 255 cc, with strong desire to void at 311 cc. There is a maximum unstable contraction of 86 cm of water at 255 cc, with severe urinary leakage. The patient voids office unstable contraction. He is then refilled for  cystogram.    The patient did not leak for leak point pressure determination.    Pressure flow study: Patient voided 382 cc, with maximum flow rate of 15 cc/s, and detrusor pressure at maximum flow of 33 cm of water pressure. The maximum detrusor pressure is 35 cm of water, and PVR is 20-30 cc. EMG is normal. VCUG shows no reflux. The bladder neck is closed at rest. There is elevation of the bladder base.    Note the patient has significant bladder instability with pressures reaching up to 86cm of water pressure with little or no warning. He does generate a voluntary contraction, and voids off the bladder instability. His flow is suggestive of obstruction. He appears to have trabeculation on cystogram, and may have diverticula as well. There is elevation of the bladder base. There is no reflux.   Past Medical History Problems  1. History of Anxiety (F41.9) 2. History of depression (Z86.59) 3. History of esophageal reflux (Z87.19) 4. History of hypercholesterolemia (Z86.39) 5. History of hypertension (Z86.79) 6. History of Skin Cancer  Surgical History Problems  1. History of Biopsy Penis Cutaneous 2. History of Biopsy Penis Deep 3. History of Cystoscopy For Urethral Stricture 4. History of Hernia Repair 5. History of Orthopedic Surgery 6. History of Surgery Penis Destruction Of Lesion(S) Extensive 7. History of Urethromeatoplasty With Mucosal Advancement  Current Meds 1. Amitriptyline HCl - 25 MG Oral Tablet;  Therapy: 24Feb2011 to Recorded 2. Aspirin 81 MG TABS;  Therapy: (Recorded:19Oct2011) to Recorded 3. Bactroban 2 % External Cream;  Therapy: (Recorded:19Oct2011) to Recorded 4. Ketoconazole 2 % External Cream;  Therapy: (Recorded:19Oct2011) to Recorded 5. Lorcet 10/650 TABS;  Therapy: (Recorded:19Oct2011) to Recorded 6. PriLOSEC 20 MG Oral Capsule Delayed Release;  Therapy: (Recorded:19Oct2011) to Recorded 7. Sertraline HCl - 25 MG Oral Tablet;  Therapy:  (Recorded:15Aug2013) to Recorded 8. Simvastatin 80 MG Oral Tablet;  Therapy: (Recorded:19Oct2011) to Recorded 9. Tamsulosin HCl - 0.4 MG Oral Capsule; TAKE ONE CAPSULE BY MOUTH AT BEDTIME;  Therapy: [id number] to (Evaluate:12Aug2017)  Requested for: 17Aug2016; Last  Rx:17Aug2016 Ordered 10. Tobramycin Sulfate 0.3 % SOLN;   Therapy: (Recorded:19Oct2011) to Recorded 11. Triamterene-HCTZ 75-50 MG Oral Tablet;   Therapy: (Recorded:19Oct2011) to Recorded  Allergies Medication  1. No Known Drug Allergies  Family History Problems  1. Family history of Family Health Status Number Of Children : Father   2 sons 2. Family history of Father Deceased At Age ____ : Father   68, heart disease 3. Family history of Heart Disease : Father 4. Family history of Lung Cancer : Mother 5. Family history of Mother Deceased At Age ____ : Father   52, lung cancer  Social History Problems  1. Denied: History of Alcohol Use 2. Caffeine Use   2-3 per wk 3. Marital History - Divorced 4. Never A Smoker 5. Occupation: Retired 37. Denied: History of Tobacco Use  Review of Systems Constitutional, skin, eye, otolaryngeal, hematologic/lymphatic, cardiovascular, pulmonary, endocrine, musculoskeletal, gastrointestinal, neurological and psychiatric system(s) were reviewed and pertinent findings if present are noted and are otherwise negative.  Genitourinary: feelings of urinary urgency, nocturia, incontinence, weak urinary stream, urinary stream starts and stops, hematuria, erectile dysfunction, penile mass and penile lesion, but no urinary frequency, no dysuria, no difficulty starting the urinary stream, no incomplete emptying of bladder, no post-void dribbling, no urethral discharge, urine is not foul-smelling, urine not cloudy, no oliguria, no pelvic pain, no suprapubic pain, preserved libido, no penile pain, no penile curvature, no penile erythema, no testicular pain, no testicular mass and initiating urination  does not require straining.  Gastrointestinal: no diarrhea and no constipation.    Physical Exam Constitutional: Well nourished and well developed . No acute distress.  ENT:. The ears and nose are normal in appearance. Examination of the teeth show poor dentition. Hearing loss is noted.  Neck: The appearance of the neck is normal and no neck mass is present.  Pulmonary: No respiratory distress and normal respiratory rhythm and effort.  Cardiovascular: Heart rate and rhythm are normal . No peripheral edema.  Abdomen: The abdomen is mildly obese. The abdomen is soft and nontender. No masses are palpated. No CVA tenderness. No hernias are palpable. No hepatosplenomegaly noted.  Rectal: Rectal exam demonstrates normal sphincter tone, no tenderness and no masses. Estimated prostate size is 4+. Resolved erythema. Minimal scarring on ventral glans surface. Normal rectal tone, no rectal masses, prostate is smooth, symmetric and non-tender. The prostate has no nodularity and is not tender. The left seminal vesicle is nonpalpable. The right seminal vesicle is nonpalpable. The perineum is normal on inspection.  Genitourinary: Examination of the penis demonstrates no swelling, no tenderness, no discharge, no masses, no adherence of the prepuce, no meatal stenosis and no redness or irritation of the meatus. The penis is circumcised. Penile lesion: A single 2 cm tender, not exudative, non-indurated and non-malodorous ulcer(s) noted. The scrotum is normal in appearance. The right testis is palpably normal. The left testis is normal. Scar on ventral surface.  Lymphatics: The femoral and inguinal nodes are not enlarged  or tender.  Skin: Normal skin turgor, no visible rash and no visible skin lesions.  Neuro/Psych:. Mood and affect are appropriate. HOH.    Results/Data Urine [Data Includes: Last 1 Day]   14Sep2016  COLOR YELLOW   APPEARANCE CLEAR   SPECIFIC GRAVITY 1.010   pH 6.0   GLUCOSE NEGATIVE   BILIRUBIN  NEGATIVE   KETONE NEGATIVE   BLOOD NEGATIVE   PROTEIN NEGATIVE   NITRITE NEGATIVE   LEUKOCYTE ESTERASE NEGATIVE    Assessment Assessed  1. Benign localized hyperplasia of prostate with urinary obstruction (N40.1,N13.8) 2. Carcinoma in situ of penis (D07.4) 3. Urinary incontinence (R32)  81 yo male, living by himself. His brother with whom he was living, has passed away 1 month ago.    Mr Yun has significant BPH, and continues to fail Rx for his bladder outlet obstruction and urge incontinence. He has had urodynamics, and has failed 2 anticholinergic medications. He will need pelvic floor PT, pre-op, and then TURP, followed by PT again until he is dry and voiding well.   Plan Benign localized hyperplasia of prostate with urinary obstruction  1. Follow-up Schedule Surgery Office  Follow-up  Status: Hold For - Appointment   Requested for: 14Sep2016 2. PT/OT Referral Referral  Referral: Pre and post TURP for urinary control.. ( Pt is Hard of  hearing. He drives from [place]).  Status: Hold For - Appointment,PreCert,Date of  Service,Physical Therapy  Requested for: 14Sep2016 Health Maintenance  3. UA With REFLEX; [Do Not Release]; Status:Resulted - Requires Verification;   Done:  [id number] 02:16PM  P.T. consult  Schedule TURP with overnight stay  PT to continue after surgery.   Signatures Electronically signed by : Carolan Clines, M.D.; Mar 27 2015  3:16PM EST

## 2015-05-10 NOTE — Anesthesia Postprocedure Evaluation (Signed)
  Anesthesia Post-op Note  Patient: Hunter [name]  Procedure(s) Performed: Procedure(s) (LRB): CYSTOSCOPY, URETHRAL MEATAL DILATION, TRANSURETHRAL RESECTION OF THE PROSTATE (TURP) (N/A)  Patient Location: PACU  Anesthesia Type: General  Level of Consciousness: awake and alert   Airway and Oxygen Therapy: Patient Spontanous Breathing  Post-op Pain: mild  Post-op Assessment: Post-op Vital signs reviewed, Patient's Cardiovascular Status Stable, Respiratory Function Stable, Patent Airway and No signs of Nausea or vomiting  Last Vitals:  Filed Vitals:   05/10/15 1336  BP: 130/67  Pulse: 74  Temp: 36.6 C  Resp:     Post-op Vital Signs: stable   Complications: No apparent anesthesia complications

## 2015-05-10 NOTE — OR Nursing (Signed)
Catheter from 2013 not recorded as removed

## 2015-05-11 DIAGNOSIS — B009 Herpesviral infection, unspecified: Secondary | ICD-10-CM | POA: Diagnosis not present

## 2015-05-11 DIAGNOSIS — D074 Carcinoma in situ of penis: Secondary | ICD-10-CM | POA: Diagnosis not present

## 2015-05-11 DIAGNOSIS — Z801 Family history of malignant neoplasm of trachea, bronchus and lung: Secondary | ICD-10-CM | POA: Diagnosis not present

## 2015-05-11 DIAGNOSIS — Z85828 Personal history of other malignant neoplasm of skin: Secondary | ICD-10-CM | POA: Diagnosis not present

## 2015-05-11 DIAGNOSIS — R32 Unspecified urinary incontinence: Secondary | ICD-10-CM | POA: Diagnosis not present

## 2015-05-11 DIAGNOSIS — N359 Urethral stricture, unspecified: Secondary | ICD-10-CM | POA: Diagnosis not present

## 2015-05-11 DIAGNOSIS — N138 Other obstructive and reflux uropathy: Secondary | ICD-10-CM | POA: Diagnosis not present

## 2015-05-11 DIAGNOSIS — F329 Major depressive disorder, single episode, unspecified: Secondary | ICD-10-CM | POA: Diagnosis not present

## 2015-05-11 DIAGNOSIS — N5201 Erectile dysfunction due to arterial insufficiency: Secondary | ICD-10-CM | POA: Diagnosis not present

## 2015-05-11 DIAGNOSIS — E7800 Pure hypercholesterolemia, unspecified: Secondary | ICD-10-CM | POA: Diagnosis not present

## 2015-05-11 DIAGNOSIS — Z01810 Encounter for preprocedural cardiovascular examination: Secondary | ICD-10-CM | POA: Diagnosis not present

## 2015-05-11 DIAGNOSIS — Z8249 Family history of ischemic heart disease and other diseases of the circulatory system: Secondary | ICD-10-CM | POA: Diagnosis not present

## 2015-05-11 DIAGNOSIS — Z8549 Personal history of malignant neoplasm of other male genital organs: Secondary | ICD-10-CM | POA: Diagnosis not present

## 2015-05-11 DIAGNOSIS — I10 Essential (primary) hypertension: Secondary | ICD-10-CM | POA: Diagnosis not present

## 2015-05-11 DIAGNOSIS — Z79899 Other long term (current) drug therapy: Secondary | ICD-10-CM | POA: Diagnosis not present

## 2015-05-11 DIAGNOSIS — Z7982 Long term (current) use of aspirin: Secondary | ICD-10-CM | POA: Diagnosis not present

## 2015-05-11 DIAGNOSIS — N401 Enlarged prostate with lower urinary tract symptoms: Secondary | ICD-10-CM | POA: Diagnosis not present

## 2015-05-11 DIAGNOSIS — Z23 Encounter for immunization: Secondary | ICD-10-CM | POA: Diagnosis not present

## 2015-05-11 DIAGNOSIS — I771 Stricture of artery: Secondary | ICD-10-CM | POA: Diagnosis not present

## 2015-05-11 DIAGNOSIS — Z01812 Encounter for preprocedural laboratory examination: Secondary | ICD-10-CM | POA: Diagnosis not present

## 2015-05-11 DIAGNOSIS — F419 Anxiety disorder, unspecified: Secondary | ICD-10-CM | POA: Diagnosis not present

## 2015-05-11 NOTE — Progress Notes (Signed)
1 Day Post-Op Subjective: Patient reports passing flatus, tolerating PO and pain control good. Urine clear on slow drip CBI  Objective: Vital signs in last 24 hours: Temp:  [97.7 F (36.5 C)-98.4 F (36.9 C)] 97.8 F (36.6 C) (10/29 1517) Pulse Rate:  [53-69] 53 (10/29 1517) Resp:  [14-18] 18 (10/29 1517) BP: (104-138)/(40-76) 104/40 mmHg (10/29 1517) SpO2:  [96 %-100 %] 100 % (10/29 1517)  Intake/Output from previous day: 10/28 0701 - 10/29 0700 In: 4128.8 [I.V.:1078.8] Out: 5975 [Urine:5975] Intake/Output this shift: Total I/O In: 1560 [P.O.:960; I.V.:600] Out: 3700 [Urine:3700]  Physical Exam:  General:alert, cooperative and appears stated age GI: soft, non tender, normal bowel sounds, no palpable masses, no organomegaly, no inguinal hernia Male genitalia: Penis: normal, no lesions Extremities: extremities normal, atraumatic, no cyanosis or edema  Lab Results: No results for input(s): HGB, HCT in the last 72 hours. BMET No results for input(s): NA, K, CL, CO2, GLUCOSE, BUN, CREATININE, CALCIUM in the last 72 hours. No results for input(s): LABPT, INR in the last 72 hours. No results for input(s): LABURIN in the last 72 hours. No results found for this or any previous visit.  Studies/Results: No results found.  Assessment/Plan: 81 yo with BPH with LUTS s/p TURP POD#1  1. D/c CBI 2. Continue ambulation in halls 3. D/C home tomorrow and followup in 2 days for foley removal  LOS: 1 day   Helder Crisafulli L 05/11/2015, 5:05 PM

## 2015-05-13 NOTE — Discharge Summary (Signed)
Physician Discharge Summary  Patient ID: Hunter [name] MRN: [id number] DOB/AGE: [birth year] 81 y.o.  Admit date: 05/10/2015 Discharge date: 05/12/2015  Admission Diagnoses: BPH  Discharge Diagnoses:  Active Problems:   Benign hypertrophy of prostate   Discharged Condition: good  Hospital Course: The patient tolerated the procedure well and was transferred to the floor on IV pain meds, IV fluid. On POD#1 pt was started on clear liquid diet and they ambulated in the halls. On POD#2 the patient was transitioned to a regular diet, IVFs were discontinued, and the patient passed flatus. Prior to discharge the pt was tolerating a regular diet, pain was controlled on PO pain meds, they were ambulating without difficulty, and they had normal bowel function.   Consults: None  Significant Diagnostic Studies: none  Treatments: surgery: TURP  Discharge Exam: Blood pressure 117/44, pulse 64, temperature 98.3 F (36.8 C), temperature source Oral, resp. rate 18, height 5\' 11"  (1.803 m), weight 109.317 kg (241 lb), SpO2 100 %. General appearance: alert, cooperative and appears stated age Head: Normocephalic, without obvious abnormality, atraumatic Neck: no adenopathy, no carotid bruit, no JVD, supple, symmetrical, trachea midline and thyroid not enlarged, symmetric, no tenderness/mass/nodules Resp: clear to auscultation bilaterally Cardio: regular rate and rhythm, S1, S2 normal, no murmur, click, rub or gallop GI: soft, non-tender; bowel sounds normal; no masses,  no organomegaly Male genitalia: normal Extremities: extremities normal, atraumatic, no cyanosis or edema Neurologic: Alert and oriented X 3, normal strength and tone. Normal symmetric reflexes. Normal coordination and gait  Disposition: 01-Home or Self Care        Follow-up Information    Follow up with Ailene Rud, MD.   Specialty:  Urology   Why:  per appointment   Contact information:   [place]  [place] [phone] [phone]       Follow up with Ailene Rud, MD. Call on 05/14/2015.   Specialty:  Urology   Why:  voiding trial   Contact information:   [place] [id number] [phone]       Signed: Cleon Gustin 05/13/2015, 1:03 PM

## 2015-05-21 DIAGNOSIS — M179 Osteoarthritis of knee, unspecified: Secondary | ICD-10-CM | POA: Diagnosis not present

## 2015-05-21 DIAGNOSIS — S83241A Other tear of medial meniscus, current injury, right knee, initial encounter: Secondary | ICD-10-CM | POA: Diagnosis not present

## 2015-05-31 DIAGNOSIS — M6281 Muscle weakness (generalized): Secondary | ICD-10-CM | POA: Diagnosis not present

## 2015-05-31 DIAGNOSIS — M6200 Separation of muscle (nontraumatic), unspecified site: Secondary | ICD-10-CM | POA: Diagnosis not present

## 2015-05-31 DIAGNOSIS — N3946 Mixed incontinence: Secondary | ICD-10-CM | POA: Diagnosis not present

## 2015-05-31 DIAGNOSIS — R278 Other lack of coordination: Secondary | ICD-10-CM | POA: Diagnosis not present

## 2015-06-12 DIAGNOSIS — N3946 Mixed incontinence: Secondary | ICD-10-CM | POA: Diagnosis not present

## 2015-06-12 DIAGNOSIS — M6281 Muscle weakness (generalized): Secondary | ICD-10-CM | POA: Diagnosis not present

## 2015-06-12 DIAGNOSIS — M6200 Separation of muscle (nontraumatic), unspecified site: Secondary | ICD-10-CM | POA: Diagnosis not present

## 2015-06-12 DIAGNOSIS — R278 Other lack of coordination: Secondary | ICD-10-CM | POA: Diagnosis not present

## 2015-06-14 DIAGNOSIS — F329 Major depressive disorder, single episode, unspecified: Secondary | ICD-10-CM | POA: Diagnosis not present

## 2015-06-14 DIAGNOSIS — G4700 Insomnia, unspecified: Secondary | ICD-10-CM | POA: Diagnosis not present

## 2015-06-14 DIAGNOSIS — E669 Obesity, unspecified: Secondary | ICD-10-CM | POA: Diagnosis not present

## 2015-06-14 DIAGNOSIS — H9190 Unspecified hearing loss, unspecified ear: Secondary | ICD-10-CM | POA: Diagnosis not present

## 2015-06-14 DIAGNOSIS — S83241A Other tear of medial meniscus, current injury, right knee, initial encounter: Secondary | ICD-10-CM | POA: Diagnosis not present

## 2015-06-14 DIAGNOSIS — K219 Gastro-esophageal reflux disease without esophagitis: Secondary | ICD-10-CM | POA: Diagnosis not present

## 2015-06-14 DIAGNOSIS — Z79899 Other long term (current) drug therapy: Secondary | ICD-10-CM | POA: Diagnosis not present

## 2015-06-14 DIAGNOSIS — Z808 Family history of malignant neoplasm of other organs or systems: Secondary | ICD-10-CM | POA: Diagnosis not present

## 2015-06-14 DIAGNOSIS — Z6833 Body mass index (BMI) 33.0-33.9, adult: Secondary | ICD-10-CM | POA: Diagnosis not present

## 2015-06-14 DIAGNOSIS — S83281A Other tear of lateral meniscus, current injury, right knee, initial encounter: Secondary | ICD-10-CM | POA: Diagnosis not present

## 2015-06-14 DIAGNOSIS — Z8249 Family history of ischemic heart disease and other diseases of the circulatory system: Secondary | ICD-10-CM | POA: Diagnosis not present

## 2015-06-14 DIAGNOSIS — Z801 Family history of malignant neoplasm of trachea, bronchus and lung: Secondary | ICD-10-CM | POA: Diagnosis not present

## 2015-06-14 DIAGNOSIS — I10 Essential (primary) hypertension: Secondary | ICD-10-CM | POA: Diagnosis not present

## 2015-06-14 DIAGNOSIS — M179 Osteoarthritis of knee, unspecified: Secondary | ICD-10-CM | POA: Diagnosis not present

## 2015-06-14 DIAGNOSIS — M1711 Unilateral primary osteoarthritis, right knee: Secondary | ICD-10-CM | POA: Diagnosis not present

## 2015-06-21 DIAGNOSIS — N3941 Urge incontinence: Secondary | ICD-10-CM | POA: Diagnosis not present

## 2015-07-12 DIAGNOSIS — M170 Bilateral primary osteoarthritis of knee: Secondary | ICD-10-CM | POA: Diagnosis not present

## 2015-07-12 DIAGNOSIS — N400 Enlarged prostate without lower urinary tract symptoms: Secondary | ICD-10-CM | POA: Diagnosis not present

## 2015-07-12 DIAGNOSIS — R079 Chest pain, unspecified: Secondary | ICD-10-CM | POA: Diagnosis not present

## 2015-07-12 DIAGNOSIS — R0789 Other chest pain: Secondary | ICD-10-CM | POA: Diagnosis not present

## 2015-07-12 DIAGNOSIS — Z6834 Body mass index (BMI) 34.0-34.9, adult: Secondary | ICD-10-CM | POA: Diagnosis not present

## 2015-07-12 DIAGNOSIS — Z7982 Long term (current) use of aspirin: Secondary | ICD-10-CM | POA: Diagnosis not present

## 2015-07-12 DIAGNOSIS — E6609 Other obesity due to excess calories: Secondary | ICD-10-CM | POA: Diagnosis not present

## 2015-07-12 DIAGNOSIS — R0602 Shortness of breath: Secondary | ICD-10-CM | POA: Diagnosis not present

## 2015-07-12 DIAGNOSIS — K219 Gastro-esophageal reflux disease without esophagitis: Secondary | ICD-10-CM | POA: Diagnosis not present

## 2015-07-12 DIAGNOSIS — Z79899 Other long term (current) drug therapy: Secondary | ICD-10-CM | POA: Diagnosis not present

## 2015-07-13 DIAGNOSIS — K219 Gastro-esophageal reflux disease without esophagitis: Secondary | ICD-10-CM | POA: Diagnosis not present

## 2015-07-13 DIAGNOSIS — R0789 Other chest pain: Secondary | ICD-10-CM | POA: Diagnosis not present

## 2015-07-13 DIAGNOSIS — M170 Bilateral primary osteoarthritis of knee: Secondary | ICD-10-CM | POA: Diagnosis not present

## 2015-07-14 HISTORY — PX: KNEE ARTHROSCOPY: SHX127

## 2015-07-17 ENCOUNTER — Other Ambulatory Visit: Payer: Self-pay | Admitting: Internal Medicine

## 2015-07-17 DIAGNOSIS — R0609 Other forms of dyspnea: Secondary | ICD-10-CM | POA: Diagnosis not present

## 2015-07-17 DIAGNOSIS — R0602 Shortness of breath: Secondary | ICD-10-CM | POA: Diagnosis not present

## 2015-07-17 DIAGNOSIS — I519 Heart disease, unspecified: Secondary | ICD-10-CM | POA: Diagnosis not present

## 2015-07-31 ENCOUNTER — Ambulatory Visit (INDEPENDENT_AMBULATORY_CARE_PROVIDER_SITE_OTHER): Payer: Medicare Other | Admitting: Internal Medicine

## 2015-07-31 ENCOUNTER — Encounter: Payer: Self-pay | Admitting: Internal Medicine

## 2015-07-31 VITALS — BP 150/80 | HR 56 | Temp 98.5°F | Wt 239.0 lb

## 2015-07-31 DIAGNOSIS — N2889 Other specified disorders of kidney and ureter: Secondary | ICD-10-CM

## 2015-07-31 DIAGNOSIS — N183 Chronic kidney disease, stage 3 unspecified: Secondary | ICD-10-CM

## 2015-07-31 DIAGNOSIS — E038 Other specified hypothyroidism: Secondary | ICD-10-CM

## 2015-07-31 DIAGNOSIS — R0789 Other chest pain: Secondary | ICD-10-CM

## 2015-07-31 DIAGNOSIS — R0602 Shortness of breath: Secondary | ICD-10-CM

## 2015-07-31 LAB — TSH: TSH: 4.37 u[IU]/mL (ref 0.35–4.50)

## 2015-07-31 MED ORDER — LEVOTHYROXINE SODIUM 50 MCG PO TABS: 50.0000 ug | ORAL_TABLET | Freq: Every day | ORAL | Status: DC

## 2015-07-31 MED ORDER — SIMVASTATIN 40 MG PO TABS: ORAL_TABLET | ORAL | Status: DC

## 2015-07-31 MED ORDER — LOSARTAN POTASSIUM 50 MG PO TABS: ORAL_TABLET | ORAL | Status: DC

## 2015-07-31 MED ORDER — TRIAMTERENE-HCTZ 37.5-25 MG PO TABS: 1.0000 | ORAL_TABLET | Freq: Every day | ORAL | Status: DC

## 2015-07-31 NOTE — Progress Notes (Signed)
Pre visit review using our clinic review tool, if applicable. No additional management support is needed unless otherwise documented below in the visit note. 

## 2015-07-31 NOTE — Progress Notes (Signed)
Subjective:    Patient ID: Hunter [name], male    DOB: 06-Feb-1934, 82 y.o.   MRN: [id number]  HPI  82 year old white male with history of hypertension, hyperlipidemia and diastolic dysfunction presents for hospital follow-up. Patient seen at [facility] on 07/13/2015 secondary to complaints of chest pain and shortness of breath. Patient describes discomfort that went across his chest and associated shortness of breath.  His shortness of breath has been going on for at least 1-2 weeks. Since emergency room evaluation, patient has limited his physical activity. He denies current chest pain or shortness of breath. Emergency room workup included chest x-ray which was reported normal. Actual chest x-ray unavailable for review. Patient's BNP was 74.71. Troponin was negative 1. His CBC was negative for anemia.  History difficult to obtain due to patient's hearing loss.  Interval medical hx - he had right knee arthroscopy at [facility] with Dr. Case.  He underwent TURP in Oct, 2016.  Hypertension -  His blood pressure is elevated today. Medication reconciliation completed. He reports usually taking losartan and triamterene at bedtime.  Never smoked.   Review of Systems Intermittent cough,  No fever or chills    Past Medical History  Diagnosis Date  . Hypertension   . Anemia   . Depression   . Hyperlipidemia   . Chronic lower back pain   . Eczema   . Peripheral vascular disease (HCC) LOWER EXTREMITIES  . 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 Arthritis   . Nocturia   . GERD (gastroesophageal reflux disease) occasional  . Hearing loss of both ears   . 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 Hx of echocardiogram     Echo (07/2013): Mild LVH, EF 0000000, grade 1 diastolic dysfunction, mild LAE, PASP 23  . LBBB (left bundle branch block)   . Hypothyroidism   . Cancer [facility])     skin  cancer    Social History   Social History  . Marital Status: Single    Spouse Name: N/A  . Number of Children: N/A  . Years of Education: N/A   Occupational History  . Not on file.   Social History Main Topics  . Smoking status: Never Smoker   . Smokeless tobacco: Never Used  . Alcohol Use: No  . Drug Use: No  . Sexual Activity: Not on file   Other Topics Concern  . Not on file   Social History Narrative    Past Surgical History  Procedure Laterality Date  . Tonsillectomy    . Hernia repair  1970'S  . Excision right wrist benign tumor  2002 (APPROX)  . Incision and drainage deep neck abscess    . Penile bx  05-09-2010  . Circumcision/ laser dissection penile glans cancer  06-02-2010  . Cystoscopy with urethral dilatation  03/28/2012    Procedure: CYSTOSCOPY WITH URETHRAL DILATATION;  Surgeon: Ailene Rud, MD;  Location: [facility];  Service: Urology;  Laterality: N/A;  excision biopsy extensive meatal penile carcinoma meatoplasty  . Transurethral resection of prostate N/A 05/10/2015    Procedure: CYSTOSCOPY, URETHRAL MEATAL DILATION, TRANSURETHRAL RESECTION OF THE PROSTATE (TURP);  Surgeon: Carolan Clines, MD;  Location: [facility];  Service: Urology;  Laterality: N/A;    Family History  Problem Relation Age of Onset  . Arthritis      family hx  .  Hyperlipidemia      family hx  . Hypertension      family hx  . Prostate cancer      family hx  . Lung cancer Mother     No Known Allergies  Medications reviewed  BP 150/80 mmHg  Pulse 56  Temp(Src) 98.5 F (36.9 C) (Oral)  Wt 239 lb (108.41 kg)  EKG shows normal sinus rhythm at 97 bpm. He has left bundle branch block versus nonspecific QRS widening. No significant change when compared to EKG completed 05/02/2005.  Objective:   Physical Exam  Constitutional: He is oriented to person, place, and time. He appears well-developed and well-nourished.  Ruddy complexion  HENT:  Head:  Normocephalic and atraumatic.  Mouth/Throat: Oropharynx is clear and moist.  Bilateral hearing loss  Neck: Neck supple.  Cardiovascular: Normal rate, regular rhythm and normal heart sounds.  Exam reveals no gallop.   No murmur heard. Pulmonary/Chest: Effort normal and breath sounds normal. He has no wheezes.  Abdominal: Soft. Bowel sounds are normal. There is no tenderness.  Musculoskeletal:  Trace bilateral lower ext edema No calf tenderness  Lymphadenopathy:    He has no cervical adenopathy.  Neurological: He is alert and oriented to person, place, and time. No cranial nerve deficit.  Skin: Skin is warm and dry.  Psychiatric: He has a normal mood and affect. His behavior is normal.        Assessment & Plan:    1.  Chest pain 2.  Shortness of breath 3.  Chronic renal insufficiency 4.  Hypertension  5.  Hyperlipidemia 6.  Hypothyroidism   Chest pain and shortness of breath and 82 year old male of unclear etiology. No significant change in his EKG. X-ray reported normal. CBCD from [facility] hospital normal.  I doubt PE.  Obtain d-dimer.  If positive, consider VQ scan.  He has CRI and I would avoid IV contrast.    Refer to cardiology for further evaluation/testing.  Monitor TFTs

## 2015-07-31 NOTE — Patient Instructions (Signed)
You will have blood test today - D Dimer.  We will contact you will results. Our office will arrange referral to cardiologist. Contact our office if you have persistent or worsening chest pain or shortness of breath

## 2015-08-01 ENCOUNTER — Telehealth: Payer: Self-pay | Admitting: *Deleted

## 2015-08-01 LAB — D-DIMER, QUANTITATIVE (NOT AT ARMC): D-Dimer, Quant: 0.4 ug/mL-FEU (ref 0.00–0.48)

## 2015-08-01 NOTE — Telephone Encounter (Signed)
-----   Message from [place], DO sent at 07/31/2015  5:38 PM EST ----- Call pt -  I increase synthroid to 50 mcg.  He needs repeat TSH in 2 months

## 2015-08-02 MED ORDER — LEVOTHYROXINE SODIUM 50 MCG PO TABS: 50.0000 ug | ORAL_TABLET | Freq: Every day | ORAL | Status: DC

## 2015-08-02 NOTE — Telephone Encounter (Signed)
Rx sent and lab appointment made.  Spoke with [employer].

## 2015-08-13 DIAGNOSIS — Z9889 Other specified postprocedural states: Secondary | ICD-10-CM | POA: Diagnosis not present

## 2015-08-13 DIAGNOSIS — M25561 Pain in right knee: Secondary | ICD-10-CM | POA: Diagnosis not present

## 2015-08-13 DIAGNOSIS — R262 Difficulty in walking, not elsewhere classified: Secondary | ICD-10-CM | POA: Diagnosis not present

## 2015-08-20 DIAGNOSIS — M25561 Pain in right knee: Secondary | ICD-10-CM | POA: Diagnosis not present

## 2015-08-20 DIAGNOSIS — Z9889 Other specified postprocedural states: Secondary | ICD-10-CM | POA: Diagnosis not present

## 2015-08-20 DIAGNOSIS — R262 Difficulty in walking, not elsewhere classified: Secondary | ICD-10-CM | POA: Diagnosis not present

## 2015-08-29 DIAGNOSIS — M25561 Pain in right knee: Secondary | ICD-10-CM | POA: Diagnosis not present

## 2015-08-29 DIAGNOSIS — R262 Difficulty in walking, not elsewhere classified: Secondary | ICD-10-CM | POA: Diagnosis not present

## 2015-08-29 DIAGNOSIS — Z9889 Other specified postprocedural states: Secondary | ICD-10-CM | POA: Diagnosis not present

## 2015-09-05 DIAGNOSIS — R262 Difficulty in walking, not elsewhere classified: Secondary | ICD-10-CM | POA: Diagnosis not present

## 2015-09-05 DIAGNOSIS — M25561 Pain in right knee: Secondary | ICD-10-CM | POA: Diagnosis not present

## 2015-09-05 DIAGNOSIS — Z9889 Other specified postprocedural states: Secondary | ICD-10-CM | POA: Diagnosis not present

## 2015-09-12 DIAGNOSIS — M25561 Pain in right knee: Secondary | ICD-10-CM | POA: Diagnosis not present

## 2015-09-12 DIAGNOSIS — R262 Difficulty in walking, not elsewhere classified: Secondary | ICD-10-CM | POA: Diagnosis not present

## 2015-09-20 NOTE — Progress Notes (Signed)
HPI: FU syncope; also history of HTN, HL, GERD, CKD, LBBB, penile cancer status post excision, spinal stenosis. Lexiscan Myoview (05/2010): No ischemia, EF 60%, normal study. Patient had ABIs in 10/2011 that were normal. He previously had a syncopal episode; monitor in December of 2014 showed sinus rhythm. Echocardiogram in January of 2015 showed normal LV function, grade 1 diastolic dysfunction, mild left atrial enlargement and trace mitral regurgitation. Since he was last seen 3/15, the patient denies any dyspnea on exertion, orthopnea, PND, pedal edema, palpitations, syncope or chest pain.   Current Outpatient Prescriptions  Medication Sig Dispense Refill  . amitriptyline (ELAVIL) 25 MG tablet Take 1 tablet (25 mg total) by mouth at bedtime. 30 tablet 1  . aspirin 81 MG tablet Take 81 mg by mouth daily.     . bacitracin ophthalmic ointment Place 1 application into the left eye 2 (two) times daily.     . ciprofloxacin (CIPRO) 500 MG tablet Take 1 tablet (500 mg total) by mouth 2 (two) times daily. 10 tablet 0  . diclofenac (VOLTAREN) 75 MG EC tablet Take 75 mg by mouth daily.    Marland Kitchen levothyroxine (SYNTHROID, LEVOTHROID) 50 MCG tablet Take 1 tablet (50 mcg total) by mouth daily before breakfast. 30 tablet 3  . losartan (COZAAR) 50 MG tablet TAKE ONE TABLET BY MOUTH ONCE DAILY **NEED APPOINTMENT** 90 tablet 0  . ranitidine (ZANTAC) 150 MG tablet Take 1 tablet (150 mg total) by mouth 2 (two) times daily as needed for heartburn. 180 tablet 1  . sertraline (ZOLOFT) 100 MG tablet Take 1 tablet (100 mg total) by mouth 2 (two) times daily. 180 tablet 1  . simvastatin (ZOCOR) 40 MG tablet TAKE ONE TABLET BY MOUTH ONCE DAILY AT  6PM 90 tablet 0  . tamsulosin (FLOMAX) 0.4 MG CAPS capsule Take 0.4 mg by mouth at bedtime.  30 capsule 3  . traMADol-acetaminophen (ULTRACET) 37.5-325 MG tablet Take 1 tablet by mouth every 6 (six) hours as needed. 30 tablet 2  . triamterene-hydrochlorothiazide (MAXZIDE-25)  37.5-25 MG tablet Take 1 tablet by mouth daily. 90 tablet 0   No current facility-administered medications for this visit.     Past Medical History  Diagnosis Date  . Hypertension   . Anemia   . Depression   . Hyperlipidemia   . Chronic lower back pain   . Eczema   . Peripheral vascular disease (HCC) LOWER EXTREMITIES  . 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 Arthritis   . Nocturia   . GERD (gastroesophageal reflux disease) occasional  . Hearing loss of both ears   . 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 Hx of echocardiogram     Echo (07/2013): Mild LVH, EF 0000000, grade 1 diastolic dysfunction, mild LAE, PASP 23  . LBBB (left bundle branch block)   . Hypothyroidism   . Cancer [facility])     skin cancer    Past Surgical History  Procedure Laterality Date  . Tonsillectomy    . Hernia repair  1970'S  . Excision right wrist benign tumor  2002 (APPROX)  . Incision and drainage deep neck abscess    . Penile bx  05-09-2010  . Circumcision/ laser dissection penile glans cancer  06-02-2010  . Cystoscopy with urethral dilatation  03/28/2012    Procedure: CYSTOSCOPY WITH URETHRAL DILATATION;  Surgeon: Ailene Rud, MD;  Location: [place];  Service: Urology;  Laterality: N/A;  excision biopsy extensive meatal penile carcinoma meatoplasty  . Transurethral resection of prostate N/A 05/10/2015    Procedure: CYSTOSCOPY, URETHRAL MEATAL DILATION, TRANSURETHRAL RESECTION OF THE PROSTATE (TURP);  Surgeon: Carolan Clines, MD;  Location: [facility];  Service: Urology;  Laterality: N/A;    Social History   Social History  . Marital Status: Single    Spouse Name: N/A  . Number of Children: N/A  . Years of Education: N/A   Occupational History  . Not on file.   Social History Main Topics  . Smoking status: Never Smoker   . Smokeless tobacco: Never Used  . Alcohol  Use: No  . Drug Use: No  . Sexual Activity: Not on file   Other Topics Concern  . Not on file   Social History Narrative    Family History  Problem Relation Age of Onset  . Arthritis      family hx  . Hyperlipidemia      family hx  . Hypertension      family hx  . Prostate cancer      family hx  . Lung cancer Mother     ROS: Knee arthralgias but no fevers or chills, productive cough, hemoptysis, dysphasia, odynophagia, melena, hematochezia, dysuria, hematuria, rash, seizure activity, orthopnea, PND, pedal edema, claudication. Remaining systems are negative.  Physical Exam: Well-developed well-nourished in no acute distress.  Skin is warm and dry.  HEENT is normal.  Neck is supple.  Chest is clear to auscultation with normal expansion.  Cardiovascular exam is regular rate and rhythm.  Abdominal exam nontender or distended. No masses palpated. Extremities show no edema. neuro grossly intact  ECG Sinus rhythm, Left bundle branch block.

## 2015-09-24 DIAGNOSIS — R32 Unspecified urinary incontinence: Secondary | ICD-10-CM | POA: Diagnosis not present

## 2015-09-24 DIAGNOSIS — N138 Other obstructive and reflux uropathy: Secondary | ICD-10-CM | POA: Diagnosis not present

## 2015-09-24 DIAGNOSIS — N3946 Mixed incontinence: Secondary | ICD-10-CM | POA: Diagnosis not present

## 2015-09-24 DIAGNOSIS — N401 Enlarged prostate with lower urinary tract symptoms: Secondary | ICD-10-CM | POA: Diagnosis not present

## 2015-09-24 DIAGNOSIS — Z0000 Encounter for general adult medical examination without abnormal findings: Secondary | ICD-10-CM | POA: Diagnosis not present

## 2015-09-26 ENCOUNTER — Encounter: Payer: Self-pay | Admitting: Cardiology

## 2015-09-26 ENCOUNTER — Ambulatory Visit (INDEPENDENT_AMBULATORY_CARE_PROVIDER_SITE_OTHER): Payer: Medicare Other | Admitting: Cardiology

## 2015-09-26 VITALS — BP 122/72 | HR 85 | Ht 71.0 in | Wt 241.0 lb

## 2015-09-26 DIAGNOSIS — R55 Syncope and collapse: Secondary | ICD-10-CM

## 2015-09-26 NOTE — Assessment & Plan Note (Signed)
No recurrent episodes. 

## 2015-09-26 NOTE — Assessment & Plan Note (Addendum)
Blood pressure controlled. Continue present medications. 

## 2015-09-26 NOTE — Patient Instructions (Signed)
Your physician recommends that you schedule a follow-up appointment in: AS NEEDED  

## 2015-09-26 NOTE — Assessment & Plan Note (Signed)
Continue statin. 

## 2015-09-30 ENCOUNTER — Other Ambulatory Visit: Payer: Medicare Other

## 2015-10-04 ENCOUNTER — Ambulatory Visit: Payer: Medicare Other | Admitting: Internal Medicine

## 2016-02-10 DIAGNOSIS — H905 Unspecified sensorineural hearing loss: Secondary | ICD-10-CM | POA: Diagnosis not present

## 2016-03-13 ENCOUNTER — Other Ambulatory Visit: Payer: Self-pay | Admitting: Adult Health

## 2016-03-13 DIAGNOSIS — Z760 Encounter for issue of repeat prescription: Secondary | ICD-10-CM

## 2016-03-13 NOTE — Telephone Encounter (Signed)
Ok to refill 

## 2016-03-13 NOTE — Telephone Encounter (Signed)
He can have 30 days, but he has not been seen in over a year and he needs to establish with another provider

## 2016-03-18 ENCOUNTER — Ambulatory Visit (INDEPENDENT_AMBULATORY_CARE_PROVIDER_SITE_OTHER): Payer: Medicare Other | Admitting: Family Medicine

## 2016-03-18 ENCOUNTER — Encounter: Payer: Self-pay | Admitting: Family Medicine

## 2016-03-18 VITALS — BP 158/72 | HR 74 | Temp 97.7°F | Ht 69.0 in | Wt 238.4 lb

## 2016-03-18 DIAGNOSIS — F324 Major depressive disorder, single episode, in partial remission: Secondary | ICD-10-CM

## 2016-03-18 DIAGNOSIS — I8310 Varicose veins of unspecified lower extremity with inflammation: Secondary | ICD-10-CM | POA: Diagnosis not present

## 2016-03-18 DIAGNOSIS — R0989 Other specified symptoms and signs involving the circulatory and respiratory systems: Secondary | ICD-10-CM

## 2016-03-18 DIAGNOSIS — Z760 Encounter for issue of repeat prescription: Secondary | ICD-10-CM

## 2016-03-18 DIAGNOSIS — I10 Essential (primary) hypertension: Secondary | ICD-10-CM

## 2016-03-18 DIAGNOSIS — I872 Venous insufficiency (chronic) (peripheral): Secondary | ICD-10-CM

## 2016-03-18 DIAGNOSIS — E039 Hypothyroidism, unspecified: Secondary | ICD-10-CM | POA: Diagnosis not present

## 2016-03-18 MED ORDER — LOSARTAN POTASSIUM 50 MG PO TABS: ORAL_TABLET | ORAL | 1 refills | Status: DC

## 2016-03-18 MED ORDER — TRIAMTERENE-HCTZ 37.5-25 MG PO TABS: 1.0000 | ORAL_TABLET | Freq: Every day | ORAL | 1 refills | Status: DC

## 2016-03-18 MED ORDER — SERTRALINE HCL 100 MG PO TABS: 100.0000 mg | ORAL_TABLET | Freq: Every day | ORAL | 1 refills | Status: DC

## 2016-03-18 MED ORDER — SIMVASTATIN 40 MG PO TABS: ORAL_TABLET | ORAL | 1 refills | Status: DC

## 2016-03-18 MED ORDER — LEVOTHYROXINE SODIUM 50 MCG PO TABS: 50.0000 ug | ORAL_TABLET | Freq: Every day | ORAL | 1 refills | Status: DC

## 2016-03-18 NOTE — Progress Notes (Signed)
Pre visit review using our clinic review tool, if applicable. No additional management support is needed unless otherwise documented below in the visit note. 

## 2016-03-18 NOTE — Progress Notes (Signed)
Subjective:  Hunter [name] is a 82 y.o. year old very pleasant male patient who presents for/with See problem oriented charting.   I will plan on accepting patient- we plan on working through acute issues then finding a time to review full history/establish.   ROS- No chest pain or shortness of breath. No headache or blurry vision. Denies calf pain or tenderness. Has edema but equal bilaterally.see any ROS included in HPI as well.   Past Medical History-  Patient Active Problem List   Diagnosis Date Noted  . Benign hypertrophy of prostate 05/10/2015    Priority: High  . Shortness of breath 12/06/2013    Priority: High  . 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 Thrombocytopenia ([place]) 05/23/2012    Priority: Medium  . Neuropathy ([place]) 11/02/2011    Priority: Medium  . CKD (chronic kidney disease), stage III 05/27/2011    Priority: Medium  . Memory loss 04/08/2011    Priority: Medium  . GAIT IMBALANCE 04/23/2010    Priority: Medium  . SPINAL STENOSIS, CERVICAL 03/21/2010    Priority: Medium  . HYPOTHYROIDISM 08/15/2008    Priority: Medium  . ANEMIA 08/15/2008    Priority: Medium  . HYPERLIPIDEMIA 02/02/2007    Priority: Medium  . DEPRESSION 02/02/2007    Priority: Medium  . HYPERTENSION 02/02/2007    Priority: Medium  . Skin lesion of left lower extremity 03/26/2014    Priority: Low  . Stasis eczema 03/26/2014    Priority: Low  . History of skin cancer 03/26/2014    Priority: Low  . Syncope 08/14/2013    Priority: Low  . Low back pain 04/08/2011    Priority: Low  . LBBB 05/12/2010    Priority: Low  . CONSTIPATION 03/19/2010    Priority: Low  . Urinary frequency 09/05/2009    Priority: Low  . VITAMIN D DEFICIENCY 08/15/2008    Priority: Low  . DERMATITIS, ATOPIC 01/19/2008    Priority: Low  . ALLERGIC RHINITIS 02/02/2007    Priority: Low  . GERD 02/02/2007    Priority: Low    Medications- reviewed and updated Current  Outpatient Prescriptions  Medication Sig Dispense Refill  . aspirin 81 MG tablet Take 81 mg by mouth daily.     Marland Kitchen levothyroxine (SYNTHROID, LEVOTHROID) 50 MCG tablet Take 1 tablet (50 mcg total) by mouth daily before breakfast. 90 tablet 1  . losartan (COZAAR) 50 MG tablet TAKE ONE TABLET BY MOUTH ONCE DAILY 90 tablet 1  . oxybutynin (DITROPAN) 5 MG tablet Take 5 mg by mouth 3 (three) times daily.    . ranitidine (ZANTAC) 150 MG tablet Take 1 tablet (150 mg total) by mouth 2 (two) times daily as needed for heartburn. 180 tablet 1  . sertraline (ZOLOFT) 100 MG tablet Take 1 tablet (100 mg total) by mouth daily. 90 tablet 1  . simvastatin (ZOCOR) 40 MG tablet TAKE ONE TABLET BY MOUTH ONCE DAILY AT  6PM 90 tablet 1  . triamterene-hydrochlorothiazide (MAXZIDE-25) 37.5-25 MG tablet Take 1 tablet by mouth daily. 90 tablet 1   No current facility-administered medications for this visit.     Objective: BP (!) 158/72 (BP Location: Left Arm, Patient Position: Sitting, Cuff Size: Large)   Pulse 74   Temp 97.7 F (36.5 C) (Oral)   Ht 5\' 9"  (1.753 m)   Wt 238 lb 6.4 oz (108.1 kg)   SpO2 96%   BMI 35.21 kg/m  Gen:  NAD, resting comfortably CV: RRR no murmurs rubs or gallops Lungs: CTAB no crackles, wheeze, rhonchi Abdomen: soft/nontender/nondistended/normal bowel sounds. No rebound or guarding.  Ext: 2+ bilateral edema Skin: warm, dry, erythema with some macules on legs as well Neuro: grossly normal, moves all extremities, normal gait, is hard of hearing  Assessment/Plan:  Hypertension- poorly controlled Hypothyroidism- unknown control Depression- admits to being grouchier since stopping meds but no SI/HI Venous stasis- likely cause of skin changes as well as edema (echo [id number]- EF [id number], diastolic grade I) S: Patient has multiple concerns today chief of which is concern with swelling in bilateral legs which seems to make his legs stiff and hard to walk. He states this is due to poor flexibility  in ankles.  right knee surgery- arthroscopic in December 2016. He is very concerned about risk of amputation due to son having bilateral AKA after similar knee surgery but patient is unable to fully explain situation surrounding this. He also notes some red bumps on his legs but states this started after a few days ago was spraying for fleas in his yard - got some of chemical on his legs- some erythema on legs.   Of note, patient states that he moved out of his son's house 3 months ago and stopped taking several medications though his history is unclear on when he stopped these. It is clear that he is out of amitriptyline and zoloft for at least a week if not more. States he only ever took 1 dose a day. Appears to have a few levothyroxine left which does not line up with prior rx. Last tsh was normal while on medication. He admits stopping maxzide some time ago. BP poorly controlled today  Does appear he is taking per bottles today: ranitidine, simvastatin, oxybutynin, aspirin, levothyroxine 50, losartan  A/P:  HTN- continue losartan, restart maxzide due to poor control. Consider bmet at 2 week follow up Hypothyroidism- refill levothyroxine 50 mcg, consider tsh at follow up Depression- restart zoloft at 100mg  only since that is what he was taking. Consider adding back amitriptyline at follow up 2 weeks Venous stasis/leg discomfort/edema- Get ABIs as pulses difficult to detect. If these are normal, consider compression stockings. Starting maxzide back may help some as well. Leg changes are likely due to stasis eczema- but recent chemical exposure could contribute  2 week follow up  Meds ordered this encounter  Medications  . oxybutynin (DITROPAN) 5 MG tablet    Sig: Take 5 mg by mouth 3 (three) times daily.  Marland Kitchen levothyroxine (SYNTHROID, LEVOTHROID) 50 MCG tablet    Sig: Take 1 tablet (50 mcg total) by mouth daily before breakfast.    Dispense:  90 tablet    Refill:  1  . losartan (COZAAR) 50 MG  tablet    Sig: TAKE ONE TABLET BY MOUTH ONCE DAILY    Dispense:  90 tablet    Refill:  1  . simvastatin (ZOCOR) 40 MG tablet    Sig: TAKE ONE TABLET BY MOUTH ONCE DAILY AT  6PM    Dispense:  90 tablet    Refill:  1  . triamterene-hydrochlorothiazide (MAXZIDE-25) 37.5-25 MG tablet    Sig: Take 1 tablet by mouth daily.    Dispense:  90 tablet    Refill:  1  . sertraline (ZOLOFT) 100 MG tablet    Sig: Take 1 tablet (100 mg total) by mouth daily.    Dispense:  90 tablet    Refill:  1  Return precautions advised.  Garret Reddish, MD

## 2016-03-18 NOTE — Patient Instructions (Signed)
We will call you within a week about your referral for studies of leg to check on blood flow. If you do not hear within 2 weeks, give [place] a call.   Meds ordered this encounter  Medications  . oxybutynin (DITROPAN) 5 MG tablet    Sig: Take 5 mg by mouth 3 (three) times daily.  Marland Kitchen levothyroxine (SYNTHROID, LEVOTHROID) 50 MCG tablet    Sig: Take 1 tablet (50 mcg total) by mouth daily before breakfast.    Dispense:  90 tablet    Refill:  1  . losartan (COZAAR) 50 MG tablet    Sig: TAKE ONE TABLET BY MOUTH ONCE DAILY    Dispense:  90 tablet    Refill:  1  . simvastatin (ZOCOR) 40 MG tablet    Sig: TAKE ONE TABLET BY MOUTH ONCE DAILY AT  6PM    Dispense:  90 tablet    Refill:  1  . triamterene-hydrochlorothiazide (MAXZIDE-25) 37.5-25 MG tablet    Sig: Take 1 tablet by mouth daily.    Dispense:  90 tablet    Refill:  1  . sertraline (ZOLOFT) 100 MG tablet    Sig: Take 1 tablet (100 mg total) by mouth daily.    Dispense:  90 tablet    Refill:  1   I only refilled zoloft at 100mg  since you have been out for sometime. Let's check back in 2 weeks from now on blood pressure and depression. Contact us or call 911 immediately if thoughts of hurting yourself. Did not start amitriptyline yet- let's titrate zoloft back up first to see if this will help. Generally we do not like to combine different antidepressants.   Start back on maxzide- may help with swelling  Start back on thyroid medicine  Hopefully you have the leg studies by the time we see you back

## 2016-03-19 ENCOUNTER — Other Ambulatory Visit: Payer: Self-pay | Admitting: Family Medicine

## 2016-03-19 DIAGNOSIS — R0989 Other specified symptoms and signs involving the circulatory and respiratory systems: Secondary | ICD-10-CM

## 2016-03-27 ENCOUNTER — Other Ambulatory Visit: Payer: Self-pay | Admitting: Family Medicine

## 2016-03-27 ENCOUNTER — Ambulatory Visit (HOSPITAL_COMMUNITY)
Admission: RE | Admit: 2016-03-27 | Discharge: 2016-03-27 | Disposition: A | Discharge status: Home / Self Care | Payer: Medicare Other | Source: Ambulatory Visit | Attending: Cardiology | Admitting: Cardiology

## 2016-03-27 DIAGNOSIS — R0989 Other specified symptoms and signs involving the circulatory and respiratory systems: Secondary | ICD-10-CM

## 2016-03-30 ENCOUNTER — Other Ambulatory Visit: Payer: Self-pay | Admitting: Family Medicine

## 2016-03-30 DIAGNOSIS — R0989 Other specified symptoms and signs involving the circulatory and respiratory systems: Secondary | ICD-10-CM

## 2016-04-01 ENCOUNTER — Other Ambulatory Visit: Payer: Self-pay

## 2016-04-01 DIAGNOSIS — I739 Peripheral vascular disease, unspecified: Secondary | ICD-10-CM

## 2016-04-03 ENCOUNTER — Encounter: Payer: Self-pay | Admitting: Family Medicine

## 2016-04-03 ENCOUNTER — Ambulatory Visit (INDEPENDENT_AMBULATORY_CARE_PROVIDER_SITE_OTHER): Payer: Medicare Other | Admitting: Family Medicine

## 2016-04-03 VITALS — BP 122/68 | HR 73 | Temp 97.9°F | Resp 20 | Ht 69.0 in | Wt 238.4 lb

## 2016-04-03 DIAGNOSIS — I10 Essential (primary) hypertension: Secondary | ICD-10-CM | POA: Diagnosis not present

## 2016-04-03 DIAGNOSIS — E785 Hyperlipidemia, unspecified: Secondary | ICD-10-CM

## 2016-04-03 DIAGNOSIS — F324 Major depressive disorder, single episode, in partial remission: Secondary | ICD-10-CM | POA: Diagnosis not present

## 2016-04-03 DIAGNOSIS — D696 Thrombocytopenia, unspecified: Secondary | ICD-10-CM

## 2016-04-03 DIAGNOSIS — E039 Hypothyroidism, unspecified: Secondary | ICD-10-CM | POA: Diagnosis not present

## 2016-04-03 DIAGNOSIS — R600 Localized edema: Secondary | ICD-10-CM

## 2016-04-03 DIAGNOSIS — Z23 Encounter for immunization: Secondary | ICD-10-CM

## 2016-04-03 LAB — CBC WITH DIFFERENTIAL/PLATELET
Basophils Absolute: 0 cells/uL (ref 0–200)
Basophils Relative: 0 %
Eosinophils Absolute: 83 cells/uL (ref 15–500)
Eosinophils Relative: 1 %
HCT: 44.4 % (ref 38.5–50.0)
Hemoglobin: 14.9 g/dL (ref 13.2–17.1)
Lymphocytes Relative: 33 %
Lymphs Abs: 2739 cells/uL (ref 850–3900)
MCH: 25.9 pg — ABNORMAL LOW (ref 27.0–33.0)
MCHC: 33.6 g/dL (ref 32.0–36.0)
MCV: 77.2 fL — ABNORMAL LOW (ref 80.0–100.0)
MPV: 11.1 fL (ref 7.5–12.5)
Monocytes Absolute: 415 cells/uL (ref 200–950)
Monocytes Relative: 5 %
Neutro Abs: 5063 cells/uL (ref 1500–7800)
Neutrophils Relative %: 61 %
Platelets: 124 10*3/uL — ABNORMAL LOW (ref 140–400)
RBC: 5.75 MIL/uL (ref 4.20–5.80)
RDW: 15.2 % — ABNORMAL HIGH (ref 11.0–15.0)
WBC: 8.3 10*3/uL (ref 3.8–10.8)

## 2016-04-03 MED ORDER — AMITRIPTYLINE HCL 25 MG PO TABS: 25.0000 mg | ORAL_TABLET | Freq: Every day | ORAL | 5 refills | Status: DC

## 2016-04-03 NOTE — Progress Notes (Signed)
Pre visit review using our clinic review tool, if applicable. No additional management support is needed unless otherwise documented below in the visit note. 

## 2016-04-03 NOTE — Progress Notes (Signed)
Subjective:  Hunter [name] is a 82 y.o. year old very pleasant male patient who presents for/with See problem oriented charting ROS- edema improved on maxzide, poor sleep but no SI, no chest pain.see any ROS included in HPI as well.   Past Medical History-  Patient Active Problem List   Diagnosis Date Noted  . Benign hypertrophy of prostate 05/10/2015    Priority: High  . Shortness of breath 12/06/2013    Priority: High  . 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 Edema 08/15/2008    Priority: High  . Thrombocytopenia ([place]) 05/23/2012    Priority: Medium  . Neuropathy ([place]) 11/02/2011    Priority: Medium  . CKD (chronic kidney disease), stage III 05/27/2011    Priority: Medium  . Memory loss 04/08/2011    Priority: Medium  . Hypothyroidism 08/15/2008    Priority: Medium  . ANEMIA 08/15/2008    Priority: Medium  . Hyperlipidemia 02/02/2007    Priority: Medium  . Major depression in partial remission ([place]) 02/02/2007    Priority: Medium  . Essential hypertension 02/02/2007    Priority: Medium  . Skin lesion of left lower extremity 03/26/2014    Priority: Low  . Stasis eczema 03/26/2014    Priority: Low  . History of skin cancer 03/26/2014    Priority: Low  . Syncope 08/14/2013    Priority: Low  . Low back pain 04/08/2011    Priority: Low  . LBBB 05/12/2010    Priority: Low  . GAIT IMBALANCE 04/23/2010    Priority: Low  . SPINAL STENOSIS, CERVICAL 03/21/2010    Priority: Low  . CONSTIPATION 03/19/2010    Priority: Low  . Urinary frequency 09/05/2009    Priority: Low  . VITAMIN D DEFICIENCY 08/15/2008    Priority: Low  . DERMATITIS, ATOPIC 01/19/2008    Priority: Low  . ALLERGIC RHINITIS 02/02/2007    Priority: Low  . GERD 02/02/2007    Priority: Low    Medications- reviewed and updated Current Outpatient Prescriptions  Medication Sig Dispense Refill  . aspirin 81 MG tablet Take 81 mg by mouth daily.     Marland Kitchen  levothyroxine (SYNTHROID, LEVOTHROID) 50 MCG tablet Take 1 tablet (50 mcg total) by mouth daily before breakfast. 90 tablet 1  . losartan (COZAAR) 50 MG tablet TAKE ONE TABLET BY MOUTH ONCE DAILY 90 tablet 1  . oxybutynin (DITROPAN) 5 MG tablet Take 5 mg by mouth 3 (three) times daily.    . ranitidine (ZANTAC) 150 MG tablet Take 1 tablet (150 mg total) by mouth 2 (two) times daily as needed for heartburn. 180 tablet 1  . sertraline (ZOLOFT) 100 MG tablet Take 1 tablet (100 mg total) by mouth daily. 90 tablet 1  . simvastatin (ZOCOR) 40 MG tablet TAKE ONE TABLET BY MOUTH ONCE DAILY AT  6PM 90 tablet 1  . triamterene-hydrochlorothiazide (MAXZIDE-25) 37.5-25 MG tablet Take 1 tablet by mouth daily. 90 tablet 1  . amitriptyline (ELAVIL) 25 MG tablet Take 1 tablet (25 mg total) by mouth at bedtime. 30 tablet 5   No current facility-administered medications for this visit.     Objective: BP 122/68   Pulse 73   Temp 97.9 F (36.6 C) (Oral)   Resp 20   Ht 5' 9"  (1.753 m)   Wt 238 lb 6.1 oz (108.1 kg)   SpO2 98%   BMI 35.20 kg/m  Gen: NAD, resting comfortably CV: RRR no murmurs  rubs or gallops Lungs: CTAB no crackles, wheeze, rhonchi Abdomen: soft/nontender/nondistended/normal bowel sounds.  obese Ext: 1+ edema left, 2+ but mildly improved on right Skin: warm, dry Neuro: grossly normal, moves all extremities  Assessment/Plan:  Essential hypertension S: controlled on losartan with recent restart maxzide BP Readings from Last 3 Encounters:  04/03/16 122/68  03/18/16 (!) 158/72  09/26/15 122/72  A/P:Continue current meds:  Much improved today. Update met today.    Hypothyroidism S: Lab Results  Component Value Date   TSH 4.37 07/31/2015   On thyroid medication-levothyroxine 50 mcg ROS-No hair or nail changes. No heat/cold intolerance. No constipation or diarrhea. Denies shakiness or anxiety.  A/P: update TSH today- controlled, continue current medications   Major depression in  partial remission ([place]) S: depression restarted zoloft 156m last visit. He states resonable control and no SI/HI but is having sleep issues A/P: amitriptyline 234madd back in at this point. Discussed potentially using 5048mt follow up- will get phq9 at that time.   [place] noted mild thrombocytopenia stable for several years A/P: stable again today- continue oto monitor   4 weeks  Orders Placed This Encounter  Procedures  . Pneumococcal conjugate vaccine 13-valent IM  . Flu vaccine HIGH DOSE PF  . Basic metabolic panel    Standing Status:   Future    Number of Occurrences:   1    Standing Expiration Date:   05/03/2016  . CBC with Differential/Platelet  . TSH    Meds ordered this encounter  Medications  . amitriptyline (ELAVIL) 25 MG tablet    Sig: Take 1 tablet (25 mg total) by mouth at bedtime.    Dispense:  30 tablet    Refill:  5    Return precautions advised.  SteGarret ReddishD

## 2016-04-03 NOTE — Patient Instructions (Addendum)
High dose flu shot before you leave  Blood pressure looks better! Continue current medicines  Labs before you leave  Restart amitriptyline at bedtime. Follow up 4 weeks to recheck depression

## 2016-04-04 LAB — BASIC METABOLIC PANEL
BUN: 23 mg/dL (ref 7–25)
CO2: 25 mmol/L (ref 20–31)
Calcium: 9.5 mg/dL (ref 8.6–10.3)
Chloride: 104 mmol/L (ref 98–110)
Creat: 1.46 mg/dL — ABNORMAL HIGH (ref 0.70–1.11)
Glucose, Bld: 89 mg/dL (ref 65–99)
Potassium: 4.5 mmol/L (ref 3.5–5.3)
Sodium: 140 mmol/L (ref 135–146)

## 2016-04-04 LAB — TSH: TSH: 2.68 mIU/L (ref 0.40–4.50)

## 2016-04-04 NOTE — Assessment & Plan Note (Signed)
Suspect venous stasis. ABI abnormal in one of toes though so before compression stockings want to get vasculars opinion if this is safe- have already referred.

## 2016-04-04 NOTE — Assessment & Plan Note (Signed)
S: Lab Results  Component Value Date   TSH 4.37 07/31/2015   On thyroid medication-levothyroxine 50 mcg ROS-No hair or nail changes. No heat/cold intolerance. No constipation or diarrhea. Denies shakiness or anxiety.  A/P: update TSH today- controlled, continue current medications

## 2016-04-04 NOTE — Assessment & Plan Note (Signed)
S: noted mild thrombocytopenia stable for several years A/P: stable again today- continue oto monitor

## 2016-04-04 NOTE — Assessment & Plan Note (Signed)
S: controlled on losartan with recent restart maxzide BP Readings from Last 3 Encounters:  04/03/16 122/68  03/18/16 (!) 158/72  09/26/15 122/72  A/P:Continue current meds:  Much improved today. Update met today.

## 2016-04-04 NOTE — Assessment & Plan Note (Signed)
S: depression restarted zoloft 100mg  last visit. He states resonable control and no SI/HI but is having sleep issues A/P: amitriptyline 25mg  add back in at this point. Discussed potentially using 50mg  at follow up- will get phq9 at that time.

## 2016-05-01 ENCOUNTER — Ambulatory Visit (INDEPENDENT_AMBULATORY_CARE_PROVIDER_SITE_OTHER): Payer: Medicare Other | Admitting: Family Medicine

## 2016-05-01 ENCOUNTER — Encounter: Payer: Self-pay | Admitting: Family Medicine

## 2016-05-01 VITALS — BP 120/64 | HR 76 | Temp 98.4°F | Wt 241.6 lb

## 2016-05-01 DIAGNOSIS — N183 Chronic kidney disease, stage 3 unspecified: Secondary | ICD-10-CM

## 2016-05-01 DIAGNOSIS — Z85828 Personal history of other malignant neoplasm of skin: Secondary | ICD-10-CM

## 2016-05-01 DIAGNOSIS — I10 Essential (primary) hypertension: Secondary | ICD-10-CM

## 2016-05-01 DIAGNOSIS — F324 Major depressive disorder, single episode, in partial remission: Secondary | ICD-10-CM

## 2016-05-01 DIAGNOSIS — Z760 Encounter for issue of repeat prescription: Secondary | ICD-10-CM | POA: Diagnosis not present

## 2016-05-01 MED ORDER — SERTRALINE HCL 100 MG PO TABS: 150.0000 mg | ORAL_TABLET | Freq: Every day | ORAL | 1 refills | Status: DC

## 2016-05-01 NOTE — Assessment & Plan Note (Addendum)
S: has now been on zoloft since 03/18/16 at 100mg  and added amitripytyline last visit with consideration of upping to 50mg  from 25mg  A/P: PHQ9 score is 9 with no SI/HI. Advised titrate to 150mg  zoloft. He is not using amitripytline nightly and working well for sleep so does not want to trial 50mg  regularly at this time. History of syncope on amitriptyline and vesicare combined- will have to be cautious on the amitriptyline but not using regularly

## 2016-05-01 NOTE — Progress Notes (Signed)
Subjective:  Hunter [name] is a 82 y.o. year old very pleasant male patient who presents for/with See problem oriented charting ROS- continued leg discomfort bilaterally due to swelling, no toe pain (did have old fracture apparently, no worsening edema. No chest pain. No blurry vision.see any ROS included in HPI as well.   Past Medical History-  Patient Active Problem List   Diagnosis Date Noted  . Benign hypertrophy of prostate 05/10/2015    Priority: High  . Shortness of breath 12/06/2013    Priority: High  . 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 Edema 08/15/2008    Priority: High  . Thrombocytopenia ([place]) 05/23/2012    Priority: Medium  . Neuropathy ([place]) 11/02/2011    Priority: Medium  . CKD (chronic kidney disease), stage III 05/27/2011    Priority: Medium  . Memory loss 04/08/2011    Priority: Medium  . Hypothyroidism 08/15/2008    Priority: Medium  . ANEMIA 08/15/2008    Priority: Medium  . Hyperlipidemia 02/02/2007    Priority: Medium  . Major depression in partial remission ([place]) 02/02/2007    Priority: Medium  . Essential hypertension 02/02/2007    Priority: Medium  . Skin lesion of left lower extremity 03/26/2014    Priority: Low  . Stasis eczema 03/26/2014    Priority: Low  . History of skin cancer 03/26/2014    Priority: Low  . Syncope 08/14/2013    Priority: Low  . Low back pain 04/08/2011    Priority: Low  . LBBB (left bundle branch block) 05/12/2010    Priority: Low  . GAIT IMBALANCE 04/23/2010    Priority: Low  . SPINAL STENOSIS, CERVICAL 03/21/2010    Priority: Low  . Constipation 03/19/2010    Priority: Low  . Urinary frequency 09/05/2009    Priority: Low  . Vitamin D deficiency 08/15/2008    Priority: Low  . Allergic rhinitis 02/02/2007    Priority: Low  . GERD 02/02/2007    Priority: Low    Medications- reviewed and updated Current Outpatient Prescriptions  Medication Sig Dispense Refill  .  amitriptyline (ELAVIL) 25 MG tablet Take 1 tablet (25 mg total) by mouth at bedtime. 30 tablet 5  . aspirin 81 MG tablet Take 81 mg by mouth daily.     Marland Kitchen levothyroxine (SYNTHROID, LEVOTHROID) 50 MCG tablet Take 1 tablet (50 mcg total) by mouth daily before breakfast. 90 tablet 1  . losartan (COZAAR) 50 MG tablet TAKE ONE TABLET BY MOUTH ONCE DAILY 90 tablet 1  . oxybutynin (DITROPAN) 5 MG tablet Take 5 mg by mouth 3 (three) times daily.    . ranitidine (ZANTAC) 150 MG tablet Take 1 tablet (150 mg total) by mouth 2 (two) times daily as needed for heartburn. 180 tablet 1  . sertraline (ZOLOFT) 100 MG tablet Take 1.5 tablets (150 mg total) by mouth daily. 135 tablet 1  . simvastatin (ZOCOR) 40 MG tablet TAKE ONE TABLET BY MOUTH ONCE DAILY AT  6PM 90 tablet 1  . triamterene-hydrochlorothiazide (MAXZIDE-25) 37.5-25 MG tablet Take 1 tablet by mouth daily. 90 tablet 1   No current facility-administered medications for this visit.     Objective: BP 120/64   Pulse 76   Temp 98.4 F (36.9 C) (Oral)   Wt 241 lb 9.6 oz (109.6 kg)   SpO2 97%   BMI 35.68 kg/m  Gen: NAD, resting comfortably CV: RRR no murmurs rubs or gallops Lungs: CTAB  no crackles, wheeze, rhonchi Abdomen: soft/nontender/nondistended/normal bowel sounds. No rebound or guarding.  Ext: 1+ edema bilaterally at least Skin: warm, dry, venous stasis change on bilateral legs  Assessment/Plan:  Major depression in partial remission (HCC) S: has now been on zoloft since 03/18/16 at 100mg  and added amitripytyline last visit with consideration of upping to 50mg  from 25mg  A/P: PHQ9 score is 9 with no SI/HI. Advised titrate to 150mg  zoloft. He is not using amitripytline nightly and working well for sleep so does not want to trial 50mg  regularly at this time. History of syncope on amitriptyline and vesicare combined- will have to be cautious on the amitriptyline but not using regularly  Essential hypertension S: controlled on losartan 50mg  and  maxzide 37.5-25mg  BP Readings from Last 3 Encounters:  05/01/16 140/62--> 120/64  04/03/16 122/68  03/18/16 (!) 158/72  A/P:Continue current meds:  Doing well on repeat  CKD (chronic kidney disease), stage III S: discussed mild elevation of creatinine at 1.46 A/P: discussed CKD and avoidance of nsaids, imporance of BP control. No change in medication   History of skin cancer Has seen Dr. Nevada Crane in past but wants new dermatologist. Referral placed today. Patient wants dermatology to look at shins specifically. He apparently thinks he has a chemical burn from working in the yard previously.   Stasis eczema I suspect leg discomfort from swelling and changes to skin are related to venous stasis. I would like to start him on compressoin stockings but toe exam on ABI not completely normal and have advised vascular follow up- printed # from referral for patient to call as they have not bene able to reach him.   6 week depression follow up verbally communicated  Orders Placed This Encounter  Procedures  . Ambulatory referral to Dermatology    Referral Priority:   Routine    Referral Type:   Consultation    Referral Reason:   Specialty Services Required    Requested Specialty:   Dermatology    Number of Visits Requested:   1    Meds ordered this encounter  Medications  . sertraline (ZOLOFT) 100 MG tablet    Sig: Take 1.5 tablets (150 mg total) by mouth daily.    Dispense:  135 tablet    Refill:  1    Return precautions advised.  Garret Reddish, MD

## 2016-05-01 NOTE — Assessment & Plan Note (Signed)
S: controlled on losartan 50mg  and maxzide 37.5-25mg  BP Readings from Last 3 Encounters:  05/01/16 140/62--> 120/64  04/03/16 122/68  03/18/16 (!) 158/72  A/P:Continue current meds:  Doing well on repeat

## 2016-05-01 NOTE — Assessment & Plan Note (Signed)
S: discussed mild elevation of creatinine at 1.46 A/P: discussed CKD and avoidance of nsaids, imporance of BP control. No change in medication

## 2016-05-01 NOTE — Assessment & Plan Note (Signed)
I suspect leg discomfort from swelling and changes to skin are related to venous stasis. I would like to start him on compressoin stockings but toe exam on ABI not completely normal and have advised vascular follow up- printed # from referral for patient to call as they have not bene able to reach him.

## 2016-05-01 NOTE — Patient Instructions (Addendum)
Increase zoloft to 1.5 pills per day. Continue amitriptyline as needed for sleep.   We will call you within a week about your referral to dermatology. If you  do not hear within 2 weeks, give [place] a call. Requested this not to be Allyn Kenner.   Call the vascular surgeons to get follow up on the leg issues. I want to put you in compression stockings but want to make sure this is safe with your blood flow

## 2016-05-01 NOTE — Progress Notes (Signed)
Pre visit review using our clinic review tool, if applicable. No additional management support is needed unless otherwise documented below in the visit note. 

## 2016-05-01 NOTE — Assessment & Plan Note (Signed)
Has seen Dr. Nevada Crane in past but wants new dermatologist. Referral placed today. Patient wants dermatology to look at shins specifically. He apparently thinks he has a chemical burn from working in the yard previously.

## 2016-05-13 ENCOUNTER — Other Ambulatory Visit: Payer: Self-pay

## 2016-05-13 DIAGNOSIS — I872 Venous insufficiency (chronic) (peripheral): Secondary | ICD-10-CM

## 2016-05-26 DIAGNOSIS — L820 Inflamed seborrheic keratosis: Secondary | ICD-10-CM | POA: Diagnosis not present

## 2016-05-26 DIAGNOSIS — I8310 Varicose veins of unspecified lower extremity with inflammation: Secondary | ICD-10-CM | POA: Diagnosis not present

## 2016-05-26 DIAGNOSIS — L814 Other melanin hyperpigmentation: Secondary | ICD-10-CM | POA: Diagnosis not present

## 2016-05-26 DIAGNOSIS — D1801 Hemangioma of skin and subcutaneous tissue: Secondary | ICD-10-CM | POA: Diagnosis not present

## 2016-05-26 DIAGNOSIS — L821 Other seborrheic keratosis: Secondary | ICD-10-CM | POA: Diagnosis not present

## 2016-05-26 DIAGNOSIS — L570 Actinic keratosis: Secondary | ICD-10-CM | POA: Diagnosis not present

## 2016-05-26 DIAGNOSIS — D235 Other benign neoplasm of skin of trunk: Secondary | ICD-10-CM | POA: Diagnosis not present

## 2016-06-18 ENCOUNTER — Encounter: Payer: Self-pay | Admitting: Vascular Surgery

## 2016-06-19 ENCOUNTER — Ambulatory Visit (HOSPITAL_COMMUNITY)
Admission: RE | Admit: 2016-06-19 | Discharge: 2016-06-19 | Disposition: A | Discharge status: Home / Self Care | Payer: Medicare Other | Source: Ambulatory Visit | Attending: Vascular Surgery | Admitting: Vascular Surgery

## 2016-06-19 ENCOUNTER — Ambulatory Visit (INDEPENDENT_AMBULATORY_CARE_PROVIDER_SITE_OTHER): Payer: Medicare Other | Admitting: Vascular Surgery

## 2016-06-19 ENCOUNTER — Encounter: Payer: Self-pay | Admitting: Vascular Surgery

## 2016-06-19 VITALS — BP 136/76 | HR 66 | Temp 98.0°F | Resp 18 | Ht 70.0 in | Wt 240.0 lb

## 2016-06-19 DIAGNOSIS — I872 Venous insufficiency (chronic) (peripheral): Secondary | ICD-10-CM

## 2016-06-19 DIAGNOSIS — R609 Edema, unspecified: Secondary | ICD-10-CM | POA: Diagnosis not present

## 2016-06-19 NOTE — Progress Notes (Signed)
Patient ID: Hunter [name], male   DOB: 1934/04/03, 82 y.o.   MRN: [id number]  Reason for Consult: Venous Insufficiency (Ref from Dr. Yong Channel  BLE venous stasis )   Referred by [name], MD  Subjective:     HPI:  Hunter [name] is a 82 y.o. male with history of CKD 3 hypertension and now with leg swelling. He is sent here for evaluation given stasis eczema with recent ABI study demonstrating ABIs greater than 1 with a TBI on the right that is 0.7. He does have chronic swelling of his legs does not have history of DVT or significant injury to either leg all though he does have surgery on the right knee recently. He is not having significant pain in either leg and specifically denies any pain with walking. States that the swelling does improve with leg elevation and when he takes his diuretic although he dislikes taking it given urinary frequency and urgency. He has no specific complaints related to today's visit and does demonstrate some flight of ideas on exam.  Past Medical History:  Diagnosis Date  . Anemia   . Arthritis   . Cancer ([place])    skin cancer  . Chronic lower back pain   . Depression   . Eczema   . GERD (gastroesophageal reflux disease) occasional  . Hearing loss of both ears   . Hx of echocardiogram    Echo (07/2013): Mild LVH, EF 0000000, grade 1 diastolic dysfunction, mild LAE, PASP 23  . Hyperlipidemia   . Hypertension   . Hypothyroidism   . LBBB (left bundle branch block)   . Nocturia   . Peripheral vascular disease (HCC) LOWER EXTREMITIES  . 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 Arthritis      family hx  . Hyperlipidemia      family hx  . Hypertension      family hx  . Prostate cancer      family hx  . Lung cancer Mother    Past Surgical History:  Procedure  Laterality Date  . CIRCUMCISION/ LASER DISSECTION PENILE GLANS CANCER  06-02-2010  . CYSTOSCOPY WITH URETHRAL DILATATION  03/28/2012   Procedure: CYSTOSCOPY WITH URETHRAL DILATATION;  Surgeon: Ailene Rud, MD;  Location: [facility];  Service: Urology;  Laterality: N/A;  excision biopsy extensive meatal penile carcinoma meatoplasty  . EXCISION RIGHT WRIST BENIGN TUMOR  2002 (APPROX)  . HERNIA REPAIR  1970'S  . INCISION AND DRAINAGE DEEP NECK ABSCESS    . PENILE BX  05-09-2010  . TONSILLECTOMY    . TRANSURETHRAL RESECTION OF PROSTATE N/A 05/10/2015   Procedure: CYSTOSCOPY, URETHRAL MEATAL DILATION, TRANSURETHRAL RESECTION OF THE PROSTATE (TURP);  Surgeon: Carolan Clines, MD;  Location: [facility];  Service: Urology;  Laterality: N/A;    Short Social History:  Social History  Substance Use Topics  . Smoking status: Never Smoker  . Smokeless tobacco: Never Used  . Alcohol use No    No Known Allergies  Current Outpatient Prescriptions  Medication Sig Dispense Refill  . amitriptyline (ELAVIL) 25 MG tablet Take 1 tablet (25 mg total) by mouth at bedtime. 30 tablet 5  . aspirin 81 MG tablet Take 81 mg by mouth daily.     Marland Kitchen levothyroxine (SYNTHROID,  LEVOTHROID) 50 MCG tablet Take 1 tablet (50 mcg total) by mouth daily before breakfast. 90 tablet 1  . losartan (COZAAR) 50 MG tablet TAKE ONE TABLET BY MOUTH ONCE DAILY 90 tablet 1  . oxybutynin (DITROPAN) 5 MG tablet Take 5 mg by mouth 3 (three) times daily.    . ranitidine (ZANTAC) 150 MG tablet Take 1 tablet (150 mg total) by mouth 2 (two) times daily as needed for heartburn. 180 tablet 1  . sertraline (ZOLOFT) 100 MG tablet Take 1.5 tablets (150 mg total) by mouth daily. 135 tablet 1  . simvastatin (ZOCOR) 40 MG tablet TAKE ONE TABLET BY MOUTH ONCE DAILY AT  6PM 90 tablet 1  . triamterene-hydrochlorothiazide (MAXZIDE-25) 37.5-25 MG tablet Take 1 tablet by mouth daily. 90 tablet 1   No current facility-administered  medications for this visit.     Review of Systems  Constitutional:  Constitutional negative. HENT: HENT negative.  Eyes: Eyes negative.  Respiratory: Respiratory negative.  Cardiovascular: Positive for leg swelling.  GI: Gastrointestinal negative.  GU: Positive for frequency and hematuria.  Musculoskeletal: Positive for back pain and joint pain.  Neurological: Neurological negative. Hematologic: Hematologic/lymphatic negative.  Psychiatric: Psychiatric negative.        Objective:  Objective   Vitals:   06/19/16 1530  BP: 136/76  Pulse: 66  Resp: 18  Temp: 98 F (36.7 C)  TempSrc: Oral  SpO2: 97%  Weight: 240 lb (108.9 kg)  Height: 5\' 10"  (1.778 m)   Body mass index is 34.44 kg/m.  Physical Exam  Constitutional: He is oriented to person, place, and time. He appears well-developed.  HENT:  Head: Atraumatic.  Eyes: EOM are normal.  Neck: Normal range of motion.  Cardiovascular: Normal rate.   Pulses:      Carotid pulses are 2+ on the right side, and 2+ on the left side.      Radial pulses are 2+ on the right side, and 2+ on the left side.       Popliteal pulses are 2+ on the right side, and 2+ on the left side.       Posterior tibial pulses are 2+ on the right side, and 2+ on the left side.  Musculoskeletal: Normal range of motion.  Lymphadenopathy:    He has no cervical adenopathy.  Neurological: He is alert and oriented to person, place, and time.  Skin: Skin is warm and dry.  Psychiatric: He has a normal mood and affect. His behavior is normal. Judgment and thought content normal.    Data: ABIs bilaterally greater than 1.  Venous insufficiency testing demonstrates bilateral common femoral vein reflux right greater saphenous reflux with greatest diameter 0.6 cm at the saphenofemoral junction.     Assessment/Plan:    82 year old white male with bilateral lower extremity edema that is likely multifactorial in nature although he does have common femoral  reflux bilaterally and greater saphenous reflux on the right proximally. His ABIs are normal with palpable pulses although he does have mildly depressed TBI on the right. I counseled him that I do not think saphenous vein ablation is likely answered to his swelling and he should remain on his diuretic as prescribed. He is also safe for compression therapy which I would recommend thigh-high 20-30 mmHg compression stockings to be worn when patient is out of bed and respiratory. At night he should elevate his legs above the level of his heart and his compression stockings can be removed. I will not schedule him follow-up  but should he need to be seen we can certainly do that in the future.     Waynetta Sandy MD [facility]

## 2016-06-23 DIAGNOSIS — L308 Other specified dermatitis: Secondary | ICD-10-CM | POA: Diagnosis not present

## 2016-06-23 DIAGNOSIS — L570 Actinic keratosis: Secondary | ICD-10-CM | POA: Diagnosis not present

## 2016-06-23 DIAGNOSIS — L219 Seborrheic dermatitis, unspecified: Secondary | ICD-10-CM | POA: Diagnosis not present

## 2016-07-13 DIAGNOSIS — S82002A Unspecified fracture of left patella, initial encounter for closed fracture: Secondary | ICD-10-CM

## 2016-07-13 HISTORY — DX: Unspecified fracture of left patella, initial encounter for closed fracture: S82.002A

## 2016-09-22 DIAGNOSIS — L219 Seborrheic dermatitis, unspecified: Secondary | ICD-10-CM | POA: Diagnosis not present

## 2016-09-22 DIAGNOSIS — L578 Other skin changes due to chronic exposure to nonionizing radiation: Secondary | ICD-10-CM | POA: Diagnosis not present

## 2016-09-22 DIAGNOSIS — L570 Actinic keratosis: Secondary | ICD-10-CM | POA: Diagnosis not present

## 2016-10-08 DIAGNOSIS — W010XXA Fall on same level from slipping, tripping and stumbling without subsequent striking against object, initial encounter: Secondary | ICD-10-CM | POA: Diagnosis not present

## 2016-10-08 DIAGNOSIS — Z79899 Other long term (current) drug therapy: Secondary | ICD-10-CM | POA: Diagnosis not present

## 2016-10-08 DIAGNOSIS — E7800 Pure hypercholesterolemia, unspecified: Secondary | ICD-10-CM | POA: Diagnosis not present

## 2016-10-08 DIAGNOSIS — Z7982 Long term (current) use of aspirin: Secondary | ICD-10-CM | POA: Diagnosis not present

## 2016-10-08 DIAGNOSIS — N400 Enlarged prostate without lower urinary tract symptoms: Secondary | ICD-10-CM | POA: Diagnosis not present

## 2016-10-08 DIAGNOSIS — K219 Gastro-esophageal reflux disease without esophagitis: Secondary | ICD-10-CM | POA: Diagnosis not present

## 2016-10-08 DIAGNOSIS — I10 Essential (primary) hypertension: Secondary | ICD-10-CM | POA: Diagnosis not present

## 2016-10-08 DIAGNOSIS — S82002A Unspecified fracture of left patella, initial encounter for closed fracture: Secondary | ICD-10-CM | POA: Diagnosis not present

## 2016-10-16 DIAGNOSIS — S82002D Unspecified fracture of left patella, subsequent encounter for closed fracture with routine healing: Secondary | ICD-10-CM | POA: Diagnosis not present

## 2016-10-30 DIAGNOSIS — S82002D Unspecified fracture of left patella, subsequent encounter for closed fracture with routine healing: Secondary | ICD-10-CM | POA: Diagnosis not present

## 2016-11-20 DIAGNOSIS — S82002D Unspecified fracture of left patella, subsequent encounter for closed fracture with routine healing: Secondary | ICD-10-CM | POA: Diagnosis not present

## 2016-12-18 DIAGNOSIS — S82002D Unspecified fracture of left patella, subsequent encounter for closed fracture with routine healing: Secondary | ICD-10-CM | POA: Diagnosis not present

## 2016-12-24 NOTE — Progress Notes (Signed)
Subjective:   Hunter [name] is a 83 y.o. male who presents for Medicare Annual/Subsequent preventive examination.  Review of Systems:  No ROS.  Medicare Wellness Visit. Additional risk factors are reflected in the social history.  Cardiac Risk Factors include: advanced age (>63men, >3 women);dyslipidemia;hypertension;male gender;obesity (BMI >30kg/m2);sedentary lifestyle  Sleep patterns: has restless sleep, has daytime sleepiness, sleeps 4-5 hours nightly. Takes frequent daytime naps. Keeps a urinal beside the bed and voids 2-3x nightly. Home Safety/Smoke Alarms: Feels safe in home. No smoke alarms in place.  Living environment; residence and Firearm Safety: Lives alone w/ his dog and cat. 1-story house/ trailer, tub-shower, no firearms.  Counseling:   Eye Exam- Does not follow w/ eye doctor currently. Would like referral to an ophthalmologist (not optometrist) for eye exam.  Dental- Follows w/ Dr. Andree Elk in [facility]  Male:   CCS-Aged out     PSA-  Lab Results  Component Value Date   PSA 3.31 05/17/2012   PSA 3.21 09/05/2009   PSA 3.26 08/15/2008       Objective:    Vitals: BP 130/72   Pulse 69   Ht 5\' 9"  (1.753 m)   Wt 232 lb 6.4 oz (105.4 kg)   SpO2 97%   BMI 34.32 kg/m   Body mass index is 34.32 kg/m.  Wt Readings from Last 3 Encounters:  12/28/16 232 lb 6.4 oz (105.4 kg)  06/19/16 240 lb (108.9 kg)  05/01/16 241 lb 9.6 oz (109.6 kg)    Tobacco History  Smoking Status  . Never Smoker  Smokeless Tobacco  . Never Used     Counseling given: Not Answered   Past Medical History:  Diagnosis Date  . Anemia   . Arthritis   . Cancer ([place])    skin cancer  . Chronic lower back pain   . Depression   . Eczema   . GERD (gastroesophageal reflux disease) occasional  . Hearing loss of both ears   . Hx of echocardiogram    Echo (07/2013): Mild LVH, EF 09-47%, grade 1 diastolic dysfunction, mild LAE, PASP 23  . Hyperlipidemia   . Hypertension   .  Hypothyroidism   . LBBB (left bundle branch block)   . Left patella fracture 2018  . Nocturia   . Peripheral vascular disease (HCC) LOWER EXTREMITIES  . 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 CIRCUMCISION/ LASER DISSECTION PENILE GLANS CANCER  06-02-2010  . CYSTOSCOPY WITH URETHRAL DILATATION  03/28/2012   Procedure: CYSTOSCOPY WITH URETHRAL DILATATION;  Surgeon: Ailene Rud, MD;  Location: [facility];  Service: Urology;  Laterality: N/A;  excision biopsy extensive meatal penile carcinoma meatoplasty  . EXCISION RIGHT WRIST BENIGN TUMOR  2002 (APPROX)  . HERNIA REPAIR  1970'S  . INCISION AND DRAINAGE DEEP NECK ABSCESS    . PENILE BX  05-09-2010  . TONSILLECTOMY    . TRANSURETHRAL RESECTION OF PROSTATE N/A 05/10/2015   Procedure: CYSTOSCOPY, URETHRAL MEATAL DILATION, TRANSURETHRAL RESECTION OF THE PROSTATE (TURP);  Surgeon: Carolan Clines, MD;  Location: [facility];  Service: Urology;  Laterality: N/A;   Family History  Problem Relation Age of Onset  . Lung cancer Mother   . Arthritis Unknown        family hx  . Hyperlipidemia Unknown  family hx  . Hypertension Unknown        family hx  . Prostate cancer Unknown        family hx   History  Sexual Activity  . Sexual activity: Not on file    Outpatient Encounter Prescriptions as of 12/28/2016  Medication Sig  . aspirin 81 MG tablet Take 81 mg by mouth daily.   Marland Kitchen levothyroxine (SYNTHROID, LEVOTHROID) 50 MCG tablet Take 1 tablet (50 mcg total) by mouth daily before breakfast.  . losartan (COZAAR) 50 MG tablet TAKE ONE TABLET BY MOUTH ONCE DAILY  . naproxen sodium (ANAPROX) 220 MG tablet Take 220 mg by mouth as needed.  Marland Kitchen oxybutynin (DITROPAN) 5 MG tablet Take 5 mg by mouth 3 (three) times daily.  . ranitidine  (ZANTAC) 150 MG tablet Take 1 tablet (150 mg total) by mouth 2 (two) times daily as needed for heartburn.  . sertraline (ZOLOFT) 100 MG tablet Take 1.5 tablets (150 mg total) by mouth daily.  . simvastatin (ZOCOR) 40 MG tablet TAKE ONE TABLET BY MOUTH ONCE DAILY AT  6PM  . triamterene-hydrochlorothiazide (MAXZIDE-25) 37.5-25 MG tablet Take 1 tablet by mouth daily.  Marland Kitchen amitriptyline (ELAVIL) 25 MG tablet Take 1 tablet (25 mg total) by mouth at bedtime. (Patient not taking: Reported on 12/28/2016)   No facility-administered encounter medications on file as of 12/28/2016.     Activities of Daily Living In your present state of health, do you have any difficulty performing the following activities: 12/28/2016  Hearing? Y  Vision? Y  Difficulty concentrating or making decisions? N  Walking or climbing stairs? Y  Dressing or bathing? N  Doing errands, shopping? N  Preparing Food and eating ? N  Using the Toilet? N  In the past six months, have you accidently leaked urine? Y  Do you have problems with loss of bowel control? N  Managing your Medications? N  Managing your Finances? Y  Housekeeping or managing your Housekeeping? N  Some recent data might be hidden    Patient Care Team: [name], MD as PCP - General (Family Medicine) Carolan Clines, MD as Consulting Physician (Urology) Stanford Breed Denice Bors, MD as Consulting Physician (Cardiology) Marchia Bond, MD as Consulting Physician (Orthopedic Surgery) [facility] Druscilla Brownie, MD as Consulting Physician (Dermatology)   Assessment:    Physical assessment deferred to PCP.  Exercise Activities and Dietary recommendations Current Exercise Habits: The patient does not participate in regular exercise at present, Exercise limited by: orthopedic condition(s) (L knee injury)  Diet (meal preparation, eat out, water intake, caffeinated beverages, dairy products, fruits and vegetables): on average,  1-2 meals per day.  Regular diet. Prepares most meals himself. Meals vary. Often eats soup. Breakfast today was Frosted Flakes and a banana.    Goals    . Increase physical activity      Fall Risk Fall Risk  12/28/2016 07/31/2015 08/14/2013  Falls in the past year? Yes No Yes  Number falls in past yr: 1 - 1  Injury with Fall? Yes - -  Risk for fall due to : Impaired mobility;History of fall(s) - -   Depression Screen PHQ 2/9 Scores 12/28/2016 07/31/2015 08/14/2013 05/23/2012  PHQ - 2 Score 2 0 0 0  PHQ- 9 Score 7 - - -    Cognitive Function MMSE - Mini Mental State Exam 12/28/2016  Orientation to time 5  Orientation to Place 5  Registration 3  Attention/ Calculation 5  Recall 3  Language- name 2 objects 2  Language- repeat 1  Language- follow 3 step command 3  Language- read & follow direction 1  Write a sentence 1  Copy design 1  Total score 30        Immunization History  Administered Date(s) Administered  . Influenza Whole 07/13/2005  . Influenza, High Dose Seasonal PF 04/03/2016  . Influenza,inj,Quad PF,36+ Mos 05/11/2015  . Pneumococcal Conjugate-13 04/03/2016  . Pneumococcal Polysaccharide-23 08/15/2008  . Td 08/15/2008  . Zoster 05/23/2012   Screening Tests Health Maintenance  Topic Date Due  . INFLUENZA VACCINE  02/10/2017  . TETANUS/TDAP  08/15/2018  . PNA vac Low Risk Adult  Completed      Plan:    Follow-up w/ PCP as scheduled.  Schedule a follow-up appointment with Dr. Gaynelle Arabian.   We will contact you regarding your opthalmology referral.   Call [facility] Department at [phone]. They can come out and install your smoke detectors.  Create and bring a copy of your advance directives to your next office visit.  I have personally reviewed and noted the following in the patient's chart:   . Medical and social history . Use of alcohol, tobacco or illicit drugs  . Current medications and supplements . Functional ability and  status . Nutritional status . Physical activity . Advanced directives . List of other physicians . Vitals . Screenings to include cognitive, depression, and falls . Referrals and appointments  In addition, I have reviewed and discussed with patient certain preventive protocols, quality metrics, and best practice recommendations. A written personalized care plan for preventive services as well as general preventive health recommendations were provided to patient.     Dorrene German, RN  12/28/2016

## 2016-12-28 ENCOUNTER — Ambulatory Visit (INDEPENDENT_AMBULATORY_CARE_PROVIDER_SITE_OTHER): Payer: Medicare Other

## 2016-12-28 VITALS — BP 130/72 | HR 69 | Ht 69.0 in | Wt 232.4 lb

## 2016-12-28 DIAGNOSIS — Z0100 Encounter for examination of eyes and vision without abnormal findings: Secondary | ICD-10-CM

## 2016-12-28 DIAGNOSIS — Z0000 Encounter for general adult medical examination without abnormal findings: Secondary | ICD-10-CM

## 2016-12-28 NOTE — Patient Instructions (Signed)
Hunter [name] , Thank you for taking time to come for your Medicare Wellness Visit. I appreciate your ongoing commitment to your health goals. Please review the following plan we discussed and let me know if I can assist you in the future.   Schedule a follow-up appointment with Dr. Gaynelle Arabian.   We will contact you regarding your opthalmology referral.   Call [facility] Department at [phone]. They can come out and install your smoke detectors.  Create and bring a copy of your advance directives to your next office visit.  These are the goals we discussed: Goals    . Increase physical activity       This is a list of the screening recommended for you and due dates:  Health Maintenance  Topic Date Due  . Flu Shot  02/10/2017  . Tetanus Vaccine  08/15/2018  . Pneumonia vaccines  Completed    Preventive Care 80 Years and Older, Male Preventive care refers to lifestyle choices and visits with your health care provider that can promote health and wellness. What does preventive care include?  A yearly physical exam. This is also called an annual well check.  Dental exams once or twice a year.  Routine eye exams. Ask your health care provider how often you should have your eyes checked.  Personal lifestyle choices, including: ? Daily care of your teeth and gums. ? Regular physical activity. ? Eating a healthy diet. ? Avoiding tobacco and drug use. ? Limiting alcohol use. ? Practicing safe sex. ? Taking low doses of aspirin every day. ? Taking vitamin and mineral supplements as recommended by your health care provider. What happens during an annual well check? The services and screenings done by your health care provider during your annual well check will depend on your age, overall health, lifestyle risk factors, and family history of disease. Counseling Your health care provider may ask you questions about your:  Alcohol use.  Tobacco use.  Drug use.  Emotional  well-being.  Home and relationship well-being.  Sexual activity.  Eating habits.  History of falls.  Memory and ability to understand (cognition).  Work and work Statistician.  Screening You may have the following tests or measurements:  Height, weight, and BMI.  Blood pressure.  Lipid and cholesterol levels. These may be checked every 5 years, or more frequently if you are over 68 years old.  Skin check.  Lung cancer screening. You may have this screening every year starting at age 49 if you have a 30-pack-year history of smoking and currently smoke or have quit within the past 15 years.  Fecal occult blood test (FOBT) of the stool. You may have this test every year starting at age 9.  Flexible sigmoidoscopy or colonoscopy. You may have a sigmoidoscopy every 5 years or a colonoscopy every 10 years starting at age 61.  Prostate cancer screening. Recommendations will vary depending on your family history and other risks.  Hepatitis C blood test.  Hepatitis B blood test.  Sexually transmitted disease (STD) testing.  Diabetes screening. This is done by checking your blood sugar (glucose) after you have not eaten for a while (fasting). You may have this done every 1-3 years.  Abdominal aortic aneurysm (AAA) screening. You may need this if you are a current or former smoker.  Osteoporosis. You may be screened starting at age 30 if you are at high risk.  Talk with your health care provider about your test results, treatment options, and if necessary,  the need for more tests. Vaccines Your health care provider may recommend certain vaccines, such as:  Influenza vaccine. This is recommended every year.  Tetanus, diphtheria, and acellular pertussis (Tdap, Td) vaccine. You may need a Td booster every 10 years.  Varicella vaccine. You may need this if you have not been vaccinated.  Zoster vaccine. You may need this after age 13.  Measles, mumps, and rubella (MMR)  vaccine. You may need at least one dose of MMR if you were born in 1957 or later. You may also need a second dose.  Pneumococcal 13-valent conjugate (PCV13) vaccine. One dose is recommended after age 91.  Pneumococcal polysaccharide (PPSV23) vaccine. One dose is recommended after age 58.  Meningococcal vaccine. You may need this if you have certain conditions.  Hepatitis A vaccine. You may need this if you have certain conditions or if you travel or work in places where you may be exposed to hepatitis A.  Hepatitis B vaccine. You may need this if you have certain conditions or if you travel or work in places where you may be exposed to hepatitis B.  Haemophilus influenzae type b (Hib) vaccine. You may need this if you have certain risk factors.  Talk to your health care provider about which screenings and vaccines you need and how often you need them. This information is not intended to replace advice given to you by your health care provider. Make sure you discuss any questions you have with your health care provider. Document Released: 07/26/2015 Document Revised: 03/18/2016 Document Reviewed: 04/30/2015 Elsevier Interactive Patient Education  2017 [employer].

## 2016-12-28 NOTE — Progress Notes (Signed)
I have reviewed and agree with note, evaluation, plan.   Camiah Humm, MD  

## 2017-01-05 ENCOUNTER — Encounter: Payer: Self-pay | Admitting: Family Medicine

## 2017-01-05 ENCOUNTER — Ambulatory Visit (INDEPENDENT_AMBULATORY_CARE_PROVIDER_SITE_OTHER): Payer: Medicare Other | Admitting: Family Medicine

## 2017-01-05 VITALS — BP 134/64 | HR 69 | Temp 98.5°F | Ht 69.0 in | Wt 227.2 lb

## 2017-01-05 DIAGNOSIS — R718 Other abnormality of red blood cells: Secondary | ICD-10-CM | POA: Diagnosis not present

## 2017-01-05 DIAGNOSIS — N183 Chronic kidney disease, stage 3 unspecified: Secondary | ICD-10-CM

## 2017-01-05 DIAGNOSIS — F324 Major depressive disorder, single episode, in partial remission: Secondary | ICD-10-CM

## 2017-01-05 DIAGNOSIS — D696 Thrombocytopenia, unspecified: Secondary | ICD-10-CM | POA: Diagnosis not present

## 2017-01-05 DIAGNOSIS — Z760 Encounter for issue of repeat prescription: Secondary | ICD-10-CM

## 2017-01-05 DIAGNOSIS — E039 Hypothyroidism, unspecified: Secondary | ICD-10-CM

## 2017-01-05 DIAGNOSIS — I10 Essential (primary) hypertension: Secondary | ICD-10-CM

## 2017-01-05 LAB — COMPREHENSIVE METABOLIC PANEL
ALT: 13 U/L (ref 0–53)
AST: 16 U/L (ref 0–37)
Albumin: 4.7 g/dL (ref 3.5–5.2)
Alkaline Phosphatase: 67 U/L (ref 39–117)
BUN: 22 mg/dL (ref 6–23)
CO2: 27 mEq/L (ref 19–32)
Calcium: 9.8 mg/dL (ref 8.4–10.5)
Chloride: 107 mEq/L (ref 96–112)
Creatinine, Ser: 1.36 mg/dL (ref 0.40–1.50)
GFR: 53.48 mL/min — ABNORMAL LOW (ref >60.00)
Glucose, Bld: 76 mg/dL (ref 70–99)
Potassium: 4.5 mEq/L (ref 3.5–5.1)
Sodium: 141 mEq/L (ref 135–145)
Total Bilirubin: 0.5 mg/dL (ref 0.2–1.2)
Total Protein: 6.6 g/dL (ref 6.0–8.3)

## 2017-01-05 LAB — TSH: TSH: 3.26 u[IU]/mL (ref 0.35–4.50)

## 2017-01-05 LAB — CBC
HCT: 44.8 % (ref 39.0–52.0)
Hemoglobin: 15.2 g/dL (ref 13.0–17.0)
MCHC: 33.9 g/dL (ref 30.0–36.0)
MCV: 76.2 fl — ABNORMAL LOW (ref 78.0–100.0)
Platelets: 127 10*3/uL — ABNORMAL LOW (ref 150.0–400.0)
RBC: 5.88 Mil/uL — ABNORMAL HIGH (ref 4.22–5.81)
RDW: 15.2 % (ref 11.5–15.5)
WBC: 6.9 10*3/uL (ref 4.0–10.5)

## 2017-01-05 LAB — FERRITIN: Ferritin: 215 ng/mL (ref 22.0–322.0)

## 2017-01-05 MED ORDER — ACYCLOVIR 5 % EX OINT: 1.0000 "application " | TOPICAL_OINTMENT | CUTANEOUS | 1 refills | Status: DC

## 2017-01-05 MED ORDER — AMITRIPTYLINE HCL 25 MG PO TABS: 25.0000 mg | ORAL_TABLET | Freq: Every day | ORAL | 1 refills | Status: DC

## 2017-01-05 MED ORDER — SERTRALINE HCL 100 MG PO TABS: 200.0000 mg | ORAL_TABLET | Freq: Every day | ORAL | 0 refills | Status: DC

## 2017-01-05 NOTE — Patient Instructions (Addendum)
Please stop by lab before you go  I reviewed your chart and you have tolerated the zoloft 200mg  along with amitriptyline well in the past- I did prescribe it as such but if you have any falls/confusion/or other issues with combo lets plan to go back to 150mg .   See me back in 6 weeks to reasssess  Zovirax for 5 days for probable herpes on penis

## 2017-01-05 NOTE — Assessment & Plan Note (Signed)
Lab Results  Component Value Date   WBC 8.3 04/03/2016   HGB 14.9 04/03/2016   HCT 44.4 04/03/2016   MCV 77.2 (L) 04/03/2016   PLT 124 (L) 04/03/2016  mild- update labs today along with ferritin for low mcv

## 2017-01-05 NOTE — Assessment & Plan Note (Signed)
S: last visit was in oct 2017 and started on zoloft 150mg  (from 100mg ) and continued on amitriptyline at 25mg  (for sleep)\- had history of orthostasis on amitriptyline and vesicare so cautious about increasing amitriptyline.   PHQ9 last visit was 9 and now 12. He states he went up to 2 zoloft a day from 1.5 because 1.5 wasn't helping much. Also ran out of amitriptyline and sleep has been worse A/P: suspect some of his issues are related to poor sleep as out of amitriptyline. I am hesitant to use 200mg  zoloft with 25mg  amitriptyline but he has tolerated this well in the past so agreed to retrial.

## 2017-01-05 NOTE — Progress Notes (Signed)
Subjective:  Hunter [name] is a 83 y.o. year old very pleasant male patient who presents for/with See problem oriented charting ROS- has some depressed mood and anhedonia. Poor sleep as ran out of amitriptyline. Sore on penis.    Past Medical History-  Patient Active Problem List   Diagnosis Date Noted  . Benign hypertrophy of prostate 05/10/2015    Priority: High  . Shortness of breath 12/06/2013    Priority: High  . 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 Edema 08/15/2008    Priority: High  . Thrombocytopenia ([place]) 05/23/2012    Priority: Medium  . Neuropathy ([place]) 11/02/2011    Priority: Medium  . CKD (chronic kidney disease), stage III 05/27/2011    Priority: Medium  . Memory loss 04/08/2011    Priority: Medium  . Hypothyroidism 08/15/2008    Priority: Medium  . ANEMIA 08/15/2008    Priority: Medium  . Hyperlipidemia 02/02/2007    Priority: Medium  . Major depression in partial remission ([place]) 02/02/2007    Priority: Medium  . Essential hypertension 02/02/2007    Priority: Medium  . Skin lesion of left lower extremity 03/26/2014    Priority: Low  . Stasis eczema 03/26/2014    Priority: Low  . History of skin cancer 03/26/2014    Priority: Low  . Syncope 08/14/2013    Priority: Low  . Low back pain 04/08/2011    Priority: Low  . LBBB (left bundle branch block) 05/12/2010    Priority: Low  . GAIT IMBALANCE 04/23/2010    Priority: Low  . SPINAL STENOSIS, CERVICAL 03/21/2010    Priority: Low  . Constipation 03/19/2010    Priority: Low  . Urinary frequency 09/05/2009    Priority: Low  . Vitamin D deficiency 08/15/2008    Priority: Low  . Allergic rhinitis 02/02/2007    Priority: Low  . GERD 02/02/2007    Priority: Low    Medications- reviewed and updated Current Outpatient Prescriptions  Medication Sig Dispense Refill  . amitriptyline (ELAVIL) 25 MG tablet Take 1 tablet (25 mg total) by mouth at bedtime. 90 tablet  1  . aspirin 81 MG tablet Take 81 mg by mouth daily.     . Cyanocobalamin (B-12 PO) Take 1 tablet by mouth daily.    Marland Kitchen levothyroxine (SYNTHROID, LEVOTHROID) 50 MCG tablet Take 1 tablet (50 mcg total) by mouth daily before breakfast. 90 tablet 1  . losartan (COZAAR) 50 MG tablet TAKE ONE TABLET BY MOUTH ONCE DAILY 90 tablet 1  . naproxen sodium (ANAPROX) 220 MG tablet Take 220 mg by mouth as needed.    Marland Kitchen oxybutynin (DITROPAN) 5 MG tablet Take 5 mg by mouth 3 (three) times daily.    . ranitidine (ZANTAC) 150 MG tablet Take 1 tablet (150 mg total) by mouth 2 (two) times daily as needed for heartburn. 180 tablet 1  . sertraline (ZOLOFT) 100 MG tablet Take 2 tablets (200 mg total) by mouth daily. 180 tablet 0  . simvastatin (ZOCOR) 40 MG tablet TAKE ONE TABLET BY MOUTH ONCE DAILY AT  6PM 90 tablet 1  . triamterene-hydrochlorothiazide (MAXZIDE-25) 37.5-25 MG tablet Take 1 tablet by mouth daily. 90 tablet 1  . acyclovir ointment (ZOVIRAX) 5 % Apply 1 application topically every 3 (three) hours. 5x a day For 5 days for genital herpes 30 g 1   No current facility-administered medications for this visit.     Objective: BP 134/64  Pulse 69   Temp 98.5 F (36.9 C) (Oral)   Ht 5\' 9"  (1.753 m)   Wt 227 lb 3.2 oz (103.1 kg)   SpO2 96%   BMI 33.55 kg/m  Gen: NAD, resting comfortably CV: RRR no murmurs rubs or gallops Lungs: CTAB no crackles, wheeze, rhonchi Abdomen: soft/nontender/nondistended/normal bowel sounds. No rebound or guarding.  Ext: 1+ edema Skin: warm, dry GU: on top of his penis there is a erythematous lesion that is slightly tender to palpation  Assessment/Plan:  Had advised vascular follow up before starting compression stockings for chronic venous insufficiency/stasis- he needs to go get these measured/started  Has had a fracture in left patella- healing well- treated at an outside hospital  Essential hypertension S: controlled on losartan 50mg  and maxzide 37.5-25mg .  ASCVD  10 year risk calculation if age 57-79: outside of age bracket BP Readings from Last 3 Encounters:  01/05/17 134/64  12/28/16 130/72  06/19/16 136/76  A/P: We discussed blood pressure goal of <140/90. Continue current meds  CKD (chronic kidney disease), stage III S: last creatinine was 1.46 and GFR decreased- discussed importance of avoiding nsaids and BP control A/P: update GFR today  Major depression in partial remission ([place]) S: last visit was in oct 2017 and started on zoloft 150mg  (from 100mg ) and continued on amitriptyline at 25mg  (for sleep)\- had history of orthostasis on amitriptyline and vesicare so cautious about increasing amitriptyline.   PHQ9 last visit was 9 and now 12. He states he went up to 2 zoloft a day from 1.5 because 1.5 wasn't helping much. Also ran out of amitriptyline and sleep has been worse A/P: suspect some of his issues are related to poor sleep as out of amitriptyline. I am hesitant to use 200mg  zoloft with 25mg  amitriptyline but he has tolerated this well in the past so agreed to retrial.   Thrombocytopenia ([place]) Lab Results  Component Value Date   WBC 8.3 04/03/2016   HGB 14.9 04/03/2016   HCT 44.4 04/03/2016   MCV 77.2 (L) 04/03/2016   PLT 124 (L) 04/03/2016  mild- update labs today along with ferritin for low mcv  6 weeks  Orders Placed This Encounter  Procedures  . CBC    [place]  . Comprehensive metabolic panel    [place]  . TSH    [place]  . Ferritin    Meds ordered this encounter  Medications  . amitriptyline (ELAVIL) 25 MG tablet    Sig: Take 1 tablet (25 mg total) by mouth at bedtime.    Dispense:  90 tablet    Refill:  1  . sertraline (ZOLOFT) 100 MG tablet    Sig: Take 2 tablets (200 mg total) by mouth daily.    Dispense:  180 tablet    Refill:  0  . acyclovir ointment (ZOVIRAX) 5 %    Sig: Apply 1 application topically every 3 (three) hours. 5x a day For 5 days for genital herpes    Dispense:  30 g    Refill:  1     Return precautions advised.  Garret Reddish, MD

## 2017-01-05 NOTE — Assessment & Plan Note (Signed)
S: controlled on losartan 50mg  and maxzide 37.5-25mg .  ASCVD 10 year risk calculation if age 81-79: outside of age bracket BP Readings from Last 3 Encounters:  01/05/17 134/64  12/28/16 130/72  06/19/16 136/76  A/P: We discussed blood pressure goal of <140/90. Continue current meds

## 2017-01-05 NOTE — Assessment & Plan Note (Signed)
S: last creatinine was 1.46 and GFR decreased- discussed importance of avoiding nsaids and BP control A/P: update GFR today

## 2017-01-06 ENCOUNTER — Telehealth: Payer: Self-pay

## 2017-01-06 NOTE — Telephone Encounter (Signed)
Received request for Acyclovir. PA submitted & is pending. Key: [id number]

## 2017-01-08 NOTE — Telephone Encounter (Signed)
PA approved, form faxed back to pharmacy. 

## 2017-02-05 ENCOUNTER — Other Ambulatory Visit: Payer: Self-pay | Admitting: Family Medicine

## 2017-02-08 ENCOUNTER — Encounter (HOSPITAL_COMMUNITY): Payer: Self-pay | Admitting: General Practice

## 2017-02-08 ENCOUNTER — Inpatient Hospital Stay (HOSPITAL_COMMUNITY)
Admission: AD | Disposition: A | Discharge status: Home Health | Payer: Self-pay | Source: Other Acute Inpatient Hospital | Attending: Internal Medicine

## 2017-02-08 ENCOUNTER — Inpatient Hospital Stay (HOSPITAL_COMMUNITY)
Admission: AD | Admit: 2017-02-08 | Discharge: 2017-02-10 | DRG: 244 | Disposition: A | Discharge status: Home Health | Payer: Medicare Other | Source: Other Acute Inpatient Hospital | Attending: Internal Medicine | Admitting: Internal Medicine

## 2017-02-08 ENCOUNTER — Ambulatory Visit (HOSPITAL_COMMUNITY): Admit: 2017-02-08 | Payer: Medicare Other | Admitting: Internal Medicine

## 2017-02-08 DIAGNOSIS — I442 Atrioventricular block, complete: Secondary | ICD-10-CM | POA: Diagnosis not present

## 2017-02-08 DIAGNOSIS — Z959 Presence of cardiac and vascular implant and graft, unspecified: Secondary | ICD-10-CM

## 2017-02-08 DIAGNOSIS — I739 Peripheral vascular disease, unspecified: Secondary | ICD-10-CM | POA: Diagnosis not present

## 2017-02-08 DIAGNOSIS — Z8549 Personal history of malignant neoplasm of other male genital organs: Secondary | ICD-10-CM | POA: Diagnosis not present

## 2017-02-08 DIAGNOSIS — I452 Bifascicular block: Principal | ICD-10-CM | POA: Diagnosis present

## 2017-02-08 DIAGNOSIS — N189 Chronic kidney disease, unspecified: Secondary | ICD-10-CM | POA: Diagnosis present

## 2017-02-08 DIAGNOSIS — F329 Major depressive disorder, single episode, unspecified: Secondary | ICD-10-CM | POA: Diagnosis present

## 2017-02-08 DIAGNOSIS — Z85828 Personal history of other malignant neoplasm of skin: Secondary | ICD-10-CM | POA: Diagnosis not present

## 2017-02-08 DIAGNOSIS — K219 Gastro-esophageal reflux disease without esophagitis: Secondary | ICD-10-CM | POA: Diagnosis present

## 2017-02-08 DIAGNOSIS — E785 Hyperlipidemia, unspecified: Secondary | ICD-10-CM | POA: Diagnosis present

## 2017-02-08 DIAGNOSIS — E039 Hypothyroidism, unspecified: Secondary | ICD-10-CM | POA: Diagnosis not present

## 2017-02-08 DIAGNOSIS — S51001A Unspecified open wound of right elbow, initial encounter: Secondary | ICD-10-CM | POA: Diagnosis not present

## 2017-02-08 DIAGNOSIS — Z7982 Long term (current) use of aspirin: Secondary | ICD-10-CM

## 2017-02-08 DIAGNOSIS — M1990 Unspecified osteoarthritis, unspecified site: Secondary | ICD-10-CM | POA: Diagnosis not present

## 2017-02-08 DIAGNOSIS — R55 Syncope and collapse: Secondary | ICD-10-CM | POA: Diagnosis not present

## 2017-02-08 DIAGNOSIS — R918 Other nonspecific abnormal finding of lung field: Secondary | ICD-10-CM | POA: Diagnosis not present

## 2017-02-08 DIAGNOSIS — Z79899 Other long term (current) drug therapy: Secondary | ICD-10-CM

## 2017-02-08 DIAGNOSIS — I129 Hypertensive chronic kidney disease with stage 1 through stage 4 chronic kidney disease, or unspecified chronic kidney disease: Secondary | ICD-10-CM | POA: Diagnosis not present

## 2017-02-08 DIAGNOSIS — R001 Bradycardia, unspecified: Secondary | ICD-10-CM | POA: Diagnosis not present

## 2017-02-08 DIAGNOSIS — I10 Essential (primary) hypertension: Secondary | ICD-10-CM | POA: Diagnosis not present

## 2017-02-08 DIAGNOSIS — R42 Dizziness and giddiness: Secondary | ICD-10-CM | POA: Diagnosis not present

## 2017-02-08 DIAGNOSIS — I130 Hypertensive heart and chronic kidney disease with heart failure and stage 1 through stage 4 chronic kidney disease, or unspecified chronic kidney disease: Secondary | ICD-10-CM | POA: Diagnosis not present

## 2017-02-08 DIAGNOSIS — E7800 Pure hypercholesterolemia, unspecified: Secondary | ICD-10-CM | POA: Diagnosis not present

## 2017-02-08 HISTORY — PX: PACEMAKER IMPLANT: EP1218

## 2017-02-08 HISTORY — DX: Atrioventricular block, complete: I44.2

## 2017-02-08 HISTORY — DX: Anxiety disorder, unspecified: F41.9

## 2017-02-08 HISTORY — DX: Malignant neoplasm of penis, unspecified: C60.9

## 2017-02-08 LAB — SURGICAL PCR SCREEN
MRSA, PCR: POSITIVE — AB
Staphylococcus aureus: POSITIVE — AB

## 2017-02-08 SURGERY — PACEMAKER IMPLANT

## 2017-02-08 MED ORDER — LOSARTAN POTASSIUM 50 MG PO TABS
50.0000 mg | ORAL_TABLET | Freq: Every day | ORAL | Status: DC
  Administered 2017-02-09 – 2017-02-10 (×2): 50 mg via ORAL
  Filled 2017-02-08 (×2): qty 1

## 2017-02-08 MED ORDER — HEPARIN (PORCINE) IN NACL 2-0.9 UNIT/ML-% IJ SOLN
INTRAMUSCULAR | Status: AC
  Filled 2017-02-08: qty 500

## 2017-02-08 MED ORDER — LIDOCAINE HCL (PF) 1 % IJ SOLN
INTRAMUSCULAR | Status: DC | PRN
  Administered 2017-02-08: 45 mL

## 2017-02-08 MED ORDER — FENTANYL CITRATE (PF) 100 MCG/2ML IJ SOLN
INTRAMUSCULAR | Status: AC
  Filled 2017-02-08: qty 2

## 2017-02-08 MED ORDER — NITROGLYCERIN 0.4 MG SL SUBL: 0.4000 mg | SUBLINGUAL_TABLET | SUBLINGUAL | Status: DC | PRN

## 2017-02-08 MED ORDER — MUPIROCIN 2 % EX OINT
TOPICAL_OINTMENT | CUTANEOUS | Status: AC
  Filled 2017-02-08: qty 22

## 2017-02-08 MED ORDER — ACETAMINOPHEN 325 MG PO TABS: 650.0000 mg | ORAL_TABLET | ORAL | Status: DC | PRN

## 2017-02-08 MED ORDER — CEFAZOLIN SODIUM-DEXTROSE 2-4 GM/100ML-% IV SOLN
2.0000 g | INTRAVENOUS | Status: AC
  Administered 2017-02-08: 2 g via INTRAVENOUS
  Filled 2017-02-08: qty 100

## 2017-02-08 MED ORDER — HEPARIN (PORCINE) IN NACL 2-0.9 UNIT/ML-% IJ SOLN
INTRAMUSCULAR | Status: AC | PRN
  Administered 2017-02-08: 500 mL

## 2017-02-08 MED ORDER — ACETAMINOPHEN 325 MG PO TABS
325.0000 mg | ORAL_TABLET | ORAL | Status: DC | PRN
  Administered 2017-02-09: 10:00:00 650 mg via ORAL
  Filled 2017-02-08: qty 2

## 2017-02-08 MED ORDER — MUPIROCIN 2 % EX OINT
1.0000 "application " | TOPICAL_OINTMENT | Freq: Two times a day (BID) | CUTANEOUS | Status: DC
  Administered 2017-02-08 – 2017-02-10 (×4): 1 via NASAL
  Filled 2017-02-08: qty 22

## 2017-02-08 MED ORDER — SIMVASTATIN 40 MG PO TABS
40.0000 mg | ORAL_TABLET | Freq: Every day | ORAL | Status: DC
  Administered 2017-02-09: 40 mg via ORAL
  Filled 2017-02-08 (×3): qty 1
  Filled 2017-02-08: qty 2

## 2017-02-08 MED ORDER — SERTRALINE HCL 100 MG PO TABS
200.0000 mg | ORAL_TABLET | Freq: Every day | ORAL | Status: DC
  Administered 2017-02-09 – 2017-02-10 (×2): 200 mg via ORAL
  Filled 2017-02-08 (×3): qty 2
  Filled 2017-02-08: qty 4

## 2017-02-08 MED ORDER — CEFAZOLIN SODIUM-DEXTROSE 1-4 GM/50ML-% IV SOLN
1.0000 g | Freq: Four times a day (QID) | INTRAVENOUS | Status: AC
  Administered 2017-02-08 – 2017-02-09 (×3): 1 g via INTRAVENOUS
  Filled 2017-02-08 (×3): qty 50

## 2017-02-08 MED ORDER — MIDAZOLAM HCL 5 MG/5ML IJ SOLN
INTRAMUSCULAR | Status: AC
  Filled 2017-02-08: qty 5

## 2017-02-08 MED ORDER — MUPIROCIN 2 % EX OINT
1.0000 "application " | TOPICAL_OINTMENT | Freq: Once | CUTANEOUS | Status: AC
  Administered 2017-02-08: 1 via TOPICAL
  Filled 2017-02-08: qty 22

## 2017-02-08 MED ORDER — SODIUM CHLORIDE 0.9 % IR SOLN
Status: AC
  Filled 2017-02-08: qty 2

## 2017-02-08 MED ORDER — CHLORHEXIDINE GLUCONATE CLOTH 2 % EX PADS
6.0000 | MEDICATED_PAD | Freq: Every day | CUTANEOUS | Status: DC
  Administered 2017-02-09 (×2): 6 via TOPICAL

## 2017-02-08 MED ORDER — SODIUM CHLORIDE 0.9 % IV SOLN: INTRAVENOUS | Status: AC

## 2017-02-08 MED ORDER — LIDOCAINE HCL (PF) 1 % IJ SOLN
INTRAMUSCULAR | Status: AC
  Filled 2017-02-08: qty 60

## 2017-02-08 MED ORDER — CEFAZOLIN SODIUM-DEXTROSE 2-4 GM/100ML-% IV SOLN
INTRAVENOUS | Status: AC
  Filled 2017-02-08: qty 100

## 2017-02-08 MED ORDER — LEVOTHYROXINE SODIUM 50 MCG PO TABS
50.0000 ug | ORAL_TABLET | Freq: Every day | ORAL | Status: DC
  Administered 2017-02-09 – 2017-02-10 (×2): 50 ug via ORAL
  Filled 2017-02-08 (×2): qty 1

## 2017-02-08 MED ORDER — ONDANSETRON HCL 4 MG/2ML IJ SOLN: 4.0000 mg | Freq: Four times a day (QID) | INTRAMUSCULAR | Status: DC | PRN

## 2017-02-08 MED ORDER — SODIUM CHLORIDE 0.9 % IR SOLN
80.0000 mg | Status: AC
  Administered 2017-02-08: 80 mg
  Filled 2017-02-08: qty 2

## 2017-02-08 MED ORDER — TRIAMTERENE-HCTZ 37.5-25 MG PO TABS
1.0000 | ORAL_TABLET | Freq: Every day | ORAL | Status: DC
  Administered 2017-02-09 – 2017-02-10 (×2): 1 via ORAL
  Filled 2017-02-08 (×2): qty 1

## 2017-02-08 MED ORDER — YOU HAVE A PACEMAKER BOOK
Freq: Once | Status: AC
  Administered 2017-02-08: 20:00:00
  Filled 2017-02-08: qty 1

## 2017-02-08 SURGICAL SUPPLY — 8 items
CABLE SURGICAL S-101-97-12 (CABLE) ×3 IMPLANT
HEMOSTAT SURGICEL 2X4 FIBR (HEMOSTASIS) ×3 IMPLANT
LEAD CAPSURE NOVUS 5076-52CM (Lead) ×3 IMPLANT
LEAD CAPSURE NOVUS 5076-58CM (Lead) ×3 IMPLANT
PACEMAKER ASSURITY DR-RF (Pacemaker) ×3 IMPLANT
PAD DEFIB LIFELINK (PAD) ×3 IMPLANT
SHEATH CLASSIC 7F (SHEATH) ×6 IMPLANT
TRAY PACEMAKER INSERTION (PACKS) ×3 IMPLANT

## 2017-02-08 NOTE — H&P (Signed)
ELECTROPHYSIOLOGY CONSULT NOTE    Patient ID: Hunter [name] MRN: [id number], DOB/AGE: September 30, 1933 83 y.o.  Admit date: 02/08/2017 Date of Consult: 02/08/2017   Primary Physician: [name], MD Primary Cardiologist: Dr. Stanford Breed  Reason for Admit: CHB, syncope There were no vitals taken for this visit. near syncope  HPI: Hunter [name] is a 83 y.o. male with PMH of HTN, HLD, LBBB, CKD, GERD, CBP spinal stenosis, was at home feeling well when he was adjusting the light switch and suddenly fainted, stated came out of nowhere suddenly fainted, woke hitting the floor.  Says luckily did not strike anything.  He denies any CP or symptoms prior, no warning, at rest here with HR 35 he is asymptomatic.  [place] labs: 7/30/18K+ 4.0 BUN/Creat 23/1.56 WBC 6.3 H/H 13.6/40.9 Plts 86 Trop T <0.01 TSH 4.92  Past Medical History:  Diagnosis Date  . Anemia   . Arthritis   . Cancer ([place])    skin cancer  . Chronic lower back pain   . Depression   . Eczema   . GERD (gastroesophageal reflux disease) occasional  . Hearing loss of both ears   . Hx of echocardiogram    Echo (07/2013): Mild LVH, EF 73-41%, grade 1 diastolic dysfunction, mild LAE, PASP 23  . Hyperlipidemia   . Hypertension   . Hypothyroidism   . LBBB (left bundle branch block)   . Left patella fracture 2018  . Nocturia   . Peripheral vascular disease ([facility]) LOWER EXTREMITIES  . 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 CIRCUMCISION/ LASER DISSECTION PENILE GLANS CANCER  06-02-2010  . CYSTOSCOPY WITH URETHRAL DILATATION  03/28/2012   Procedure: CYSTOSCOPY WITH URETHRAL DILATATION;  Surgeon: Ailene Rud, MD;  Location: [facility];  Service: Urology;  Laterality: N/A;  excision biopsy  extensive meatal penile carcinoma meatoplasty  . EXCISION RIGHT WRIST BENIGN TUMOR  2002 (APPROX)  . HERNIA REPAIR  1970'S  . INCISION AND DRAINAGE DEEP NECK ABSCESS    . PENILE BX  05-09-2010  . TONSILLECTOMY    . TRANSURETHRAL RESECTION OF PROSTATE N/A 05/10/2015   Procedure: CYSTOSCOPY, URETHRAL MEATAL DILATION, TRANSURETHRAL RESECTION OF THE PROSTATE (TURP);  Surgeon: Carolan Clines, MD;  Location: [facility];  Service: Urology;  Laterality: N/A;     Prescriptions Prior to Admission  Medication Sig Dispense Refill Last Dose  . acyclovir ointment (ZOVIRAX) 5 % Apply 1 application topically every 3 (three) hours. 5x a day For 5 days for genital herpes 30 g 1   . amitriptyline (ELAVIL) 25 MG tablet Take 1 tablet (25 mg total) by mouth at bedtime. 90 tablet 1   . aspirin 81 MG tablet Take 81 mg by mouth daily.    Taking  . Cyanocobalamin (B-12 PO) Take 1 tablet by mouth daily.   Taking  . levothyroxine (SYNTHROID, LEVOTHROID) 50 MCG tablet TAKE ONE TABLET BY MOUTH ONCE DAILY BEFORE BREAKFAST 90 tablet 1   . losartan (COZAAR) 50 MG tablet TAKE ONE TABLET BY MOUTH ONCE DAILY 90 tablet 1 Taking  . naproxen sodium (ANAPROX) 220 MG tablet Take 220 mg by mouth as needed.   Taking  . oxybutynin (DITROPAN) 5 MG tablet Take 5 mg by mouth 3 (three) times daily.  Taking  . ranitidine (ZANTAC) 150 MG tablet Take 1 tablet (150 mg total) by mouth 2 (two) times daily as needed for heartburn. 180 tablet 1 Taking  . sertraline (ZOLOFT) 100 MG tablet Take 2 tablets (200 mg total) by mouth daily. 180 tablet 0   . simvastatin (ZOCOR) 40 MG tablet TAKE ONE TABLET BY MOUTH ONCE DAILY AT  6PM 90 tablet 1 Taking  . triamterene-hydrochlorothiazide (MAXZIDE-25) 37.5-25 MG tablet Take 1 tablet by mouth daily. 90 tablet 1 Taking    Inpatient Medications:   Allergies: No Known Allergies  Social History   Social History  . Marital status: Single    Spouse name: N/A  . Number of children: N/A  . Years of  education: N/A   Occupational History  . Not on file.   Social History Main Topics  . Smoking status: Never Smoker  . Smokeless tobacco: Never Used  . Alcohol use No  . Drug use: No  . Sexual activity: Not on file   Other Topics Concern  . Not on file   Social History Narrative  . No narrative on file     Family History  Problem Relation Age of Onset  . Lung cancer Mother   . Arthritis Unknown        family hx  . Hyperlipidemia Unknown        family hx  . Hypertension Unknown        family hx  . Prostate cancer Unknown        family hx     Review of Systems: All other systems reviewed and are otherwise negative except as noted above.  Physical Exam: There were no vitals filed for this visit.  GEN- The patient is well appearing, somewhat unkept appearance, alert and oriented x 3 today.   HEENT: normocephalic, atraumatic; sclera clear, conjunctiva pink; hearing intact; oropharynx clear; neck supple, no JVP Lymph- no cervical lymphadenopathy Lungs- CTA b/l, normal work of breathing.  No wheezes, rales, rhonchi Heart- RRR, bradycardic, no murmurs, rubs or gallops, PMI not laterally displaced GI- soft, non-tender, non-distended Extremities- no clubbing, cyanosis, or edema MS- no significant deformity or atrophy Skin- warm and dry, no rash or lesion Psych- euthymic mood, full affect Neuro- no gross deficits observed  Labs:   Lab Results  Component Value Date   WBC 6.9 01/05/2017   HGB 15.2 01/05/2017   HCT 44.8 01/05/2017   MCV 76.2 (L) 01/05/2017   PLT 127.0 (L) 01/05/2017   No results for input(s): NA, K, CL, CO2, BUN, CREATININE, CALCIUM, PROT, BILITOT, ALKPHOS, ALT, AST, GLUCOSE in the last 168 hours.  Invalid input(s): LABALBU    Radiology/Studies: No results found.  Reviewed by myself: EKG:  02/08/17 from [facility]: CHB, RBBB, V rate 31 TELEMETRY: CHB V rate 30's  08/09/13: TTE Study Conclusions - Left ventricle: The cavity size was normal. Wall  thickness was increased in a pattern of mild LVH. Systolic function was normal. The estimated ejection fraction was in the range of 55% to 60%. There was septal-lateral dyssynchrony. Doppler parameters are consistent with abnormal left ventricular relaxation (grade 1 diastolic dysfunction). - Aortic valve: There was no stenosis. - Mitral valve: Trivial regurgitation. - Left atrium: The atrium was mildly dilated. - Right ventricle: The cavity size was normal. Systolic function was normal. - Tricuspid valve: Peak RV-RA gradient: 43mm Hg (S). - Pulmonary arteries: PA peak pressure: 43mm Hg (S). - Inferior vena cava: The vessel was normal in size; the respirophasic diameter  changes were in the normal range (= 50%); findings are consistent with normal central venous pressure. Impressions: - Normal LV size with mild LV hypertrophy. EF 55-60% with septal-lateral dyssynchrony consistent with LBBB. Normal RV size and systolic function. No significant valvular abnormalities.   Assessment and Plan:   1. Syncope vs near syncope, CHB     RBBB escape rhythm, LBBB baseline     Home meds reviewed, no nodal blocking/rate limiting meds  Dr. Caryl Comes discussed with the patient PPM implant procedure, risks, benefits, the patient reported they mentioned in [place] he may need one and is agreeable to proceed.  The patient's pharmacy ([facility] [address]) reported the current list as the only active prescriptions: Patient confirms this is the only pharmacy he uses Zoloft 100mg  2 tabs daily Simvastatin 40mg  daily Levothyroxine 50mg  daily Losartan 50mg  daily  2. HTN    Continue home ARB  Signed, Tommye Standard, PA-C 02/08/2017 3:27 PM  Pt seen and examined Hx of syncope and then abrupt on set of dyspnea and weakness and found to be in CHB Prior Hx LBBB and previously normal LVfunction Tn -  At [facility] TSH nl  The benefits and risks were reviewed including but not  limited to death,  perforation, infection, lead dislodgement and device malfunction.  The patient understands agrees and is willing to proceed.

## 2017-02-08 NOTE — Discharge Instructions (Signed)
° ° °  Supplemental Discharge Instructions for  Pacemaker/Defibrillator Patients  Activity No heavy lifting or vigorous activity with your left/right arm for 6 to 8 weeks.  Do not raise your left/right arm above your head for one week.  Gradually raise your affected arm as drawn below.              02/12/17                      02/13/17                      02/14/17                     02/15/17 __  NO DRIVING until cleared to at your wound check visit  [place] the wound area clean and dry.  Do not get this area wet, no showers for one week; you may shower on  02/09/17 evening   . - The tape/steri-strips on your wound will fall off; do not pull them off.  No bandage is needed on the site.  DO  NOT apply any creams, oils, or ointments to the wound area. - If you notice any drainage or discharge from the wound, any swelling or bruising at the site, or you develop a fever > 101? F after you are discharged home, call the office at once.  Special Instructions - You are still able to use cellular telephones; use the ear opposite the side where you have your pacemaker/defibrillator.  Avoid carrying your cellular phone near your device. - When traveling through airports, show security personnel your identification card to avoid being screened in the metal detectors.  Ask the security personnel to use the hand wand. - Avoid arc welding equipment, MRI testing (magnetic resonance imaging), TENS units (transcutaneous nerve stimulators).  Call the office for questions about other devices. - Avoid electrical appliances that are in poor condition or are not properly grounded. - Microwave ovens are safe to be near or to operate.  Additional information for defibrillator patients should your device go off: - If your device goes off ONCE and you feel fine afterward, notify the device clinic nurses. - If your device goes off ONCE and you do not feel well afterward, call 911. - If your device goes off TWICE,  call 911. - If your device goes off THREE times in one day, call 911.  DO NOT DRIVE YOURSELF OR A FAMILY MEMBER WITH A DEFIBRILLATOR TO THE HOSPITAL--CALL 911.

## 2017-02-09 ENCOUNTER — Inpatient Hospital Stay (HOSPITAL_COMMUNITY): Payer: Medicare Other

## 2017-02-09 ENCOUNTER — Encounter (HOSPITAL_COMMUNITY): Payer: Self-pay | Admitting: Internal Medicine

## 2017-02-09 MED ORDER — AMITRIPTYLINE HCL 25 MG PO TABS
25.0000 mg | ORAL_TABLET | Freq: Every day | ORAL | Status: DC
  Administered 2017-02-09: 25 mg via ORAL
  Filled 2017-02-09: qty 1

## 2017-02-09 MED FILL — Fentanyl Citrate Preservative Free (PF) Inj 100 MCG/2ML: INTRAMUSCULAR | Qty: 2 | Status: AC

## 2017-02-09 MED FILL — Midazolam HCl Inj 5 MG/5ML (Base Equivalent): INTRAMUSCULAR | Qty: 5 | Status: AC

## 2017-02-09 NOTE — Discharge Summary (Signed)
ELECTROPHYSIOLOGY PROCEDURE DISCHARGE SUMMARY    Patient ID: Hunter [name],  MRN: [id number], DOB/AGE: 81-07-1933 81 y.o.  Admit date: 02/08/2017 Discharge date: 02/10/17  Primary Care Physician: [name], MD  Primary Cardiologist: Dr. Stanford Breed Electrophysiologist: Dr. Caryl Comes this admission with have f/u in [facility], Dr. Rayann Heman will be new to him  Primary Discharge Diagnosis:  1. Near Syncope, CHB  Secondary Discharge Diagnosis:  1. HTN 2. HLD  No Known Allergies   Procedures This Admission:  1.  Implantation of a  STM dual chamber PPM on 02/08/17 by Dr Caryl Comes.  The patient received a St Jude MRI compatible pulse generator serial number [id number], Medtronic MRI compatible 5076 ventricular lead serial [id number] and a Medtronic MRI compatible 5076 atrial lead serial number [id number]  There were no immediate post procedure complications. 2.  CXR on 02/09/17 demonstrated no pneumothorax status post device implantation.   Brief HPI: Hunter [name] is a 83 y.o. male  with PMH of HTN, HLD, LBBB, CKD, GERD, CBP spinal stenosis, was at home feeling well when he was adjusting the light switch and suddenly fainted, stated came out of nowhere suddenly fainted, woke hitting the floor, he initially was evaluated at [facility] ER found in [place] and transferred to [facility].  Hospital Course:  The patient was admitted, home list of medicines was uncertain, his only current pharmacy he reports using was called and confirmed the current in-patient list as accurate, noting no reversible cause for the bradycardia were found, he had baseline LBBB, escape was RBBB and underwent implantation of a PPM with details as outlined above.  His was device dependent on POD#1 and monitored another 24 hours.  He maintained SR/Vpacing on telemetry.  Left chest was without hematoma or ecchymosis.  The device was interrogated POD #1 and found to be functioning normally.  CXR was obtained and demonstrated no  pneumothorax status post device implantation, pulmonary exam is negative, no complaints of SOB.  Rn felt the patient was somewhat unsteady ambulating, PT consult was obtained and recommended home health PT, case management evaluate for home needs as well, recommended aid, RN initially at least, all were ordered  Wound care, arm mobility, and restrictions were reviewed with the patient.  The patient was examined by Dr.  Caryl Comes and considered stable for discharge to home.  Will add amlodipine for added BP control.   Physical Exam: Vitals:   02/09/17 1300 02/09/17 2014 02/10/17 0653 02/10/17 1112  BP: (!) 114/92 (!) 166/60 (!) 162/33 (!) 175/58  Pulse: 63 (!) 59 63 63  Resp: 16 (!) 25 (!) 23 (!) 21  Temp: 98.6 F (37 C) 97.8 F (36.6 C) (!) 96.4 F (35.8 C) (!) 96.7 F (35.9 C)  TempSrc: Oral Oral Oral Oral  SpO2: 97% 97% 93% 100%  Weight:  229 lb 0.9 oz (103.9 kg) 225 lb 12 oz (102.4 kg)   Height:  5\' 11"  (1.803 m)      GEN- The patient is well appearing, alert and oriented x 3 today.   HEENT: normocephalic, atraumatic; sclera clear, conjunctiva pink; hearing intact; oropharynx clear; neck supple, no JVP Lungs- CTA b/l, normal work of breathing.  No wheezes, rales, rhonchi Heart- RRR, no murmurs, rubs or gallops, PMI not laterally displaced GI- soft, non-tender, non-distended Extremities- no clubbing, cyanosis, or edema MS- no significant deformity or atrophy Skin- warm and dry, no rash or lesion, left chest without hematoma/ecchymosis Psych- euthymic mood, full affect Neuro- no gross deficits  Labs:   Lab Results  Component Value Date   WBC 6.9 01/05/2017   HGB 15.2 01/05/2017   HCT 44.8 01/05/2017   MCV 76.2 (L) 01/05/2017   PLT 127.0 (L) 01/05/2017   No results for input(s): NA, K, CL, CO2, BUN, CREATININE, CALCIUM, PROT, BILITOT, ALKPHOS, ALT, AST, GLUCOSE in the last 168 hours.  Invalid input(s): LABALBU  Discharge Medications:  Allergies as of 02/10/2017   No Known  Allergies     Medication List    TAKE these medications   acyclovir ointment 5 % Commonly known as:  ZOVIRAX Apply 1 application topically every 3 (three) hours. 5x a day For 5 days for genital herpes Notes to patient:  Confirm this medication with the prescribing doctor   amitriptyline 25 MG tablet Commonly known as:  ELAVIL Take 1 tablet (25 mg total) by mouth at bedtime. Notes to patient:  Confirm this medicine with the prescribing doctor   amLODipine 5 MG tablet Commonly known as:  NORVASC Take 1 tablet (5 mg total) by mouth daily.   aspirin 81 MG tablet Take 81 mg by mouth daily.   B-12 PO Take 1 tablet by mouth daily.   levothyroxine 50 MCG tablet Commonly known as:  SYNTHROID, LEVOTHROID TAKE ONE TABLET BY MOUTH ONCE DAILY BEFORE BREAKFAST What changed:  See the new instructions.   losartan 50 MG tablet Commonly known as:  COZAAR TAKE ONE TABLET BY MOUTH ONCE DAILY What changed:  how much to take  how to take this  when to take this  additional instructions   mupirocin ointment 2 % Commonly known as:  BACTROBAN Place 1 application into the nose 2 (two) times daily.   naproxen sodium 220 MG tablet Commonly known as:  ANAPROX Take 220 mg by mouth daily as needed (for pain or headache). Notes to patient:  Confirm the medication with the prescribing doctor    oxybutynin 5 MG tablet Commonly known as:  DITROPAN Take 5 mg by mouth 3 (three) times daily. Notes to patient:  Confirm this medication with the prescribing doctor   ranitidine 150 MG tablet Commonly known as:  ZANTAC Take 1 tablet (150 mg total) by mouth 2 (two) times daily as needed for heartburn.   sertraline 100 MG tablet Commonly known as:  ZOLOFT Take 2 tablets (200 mg total) by mouth daily.   simvastatin 40 MG tablet Commonly known as:  ZOCOR TAKE ONE TABLET BY MOUTH ONCE DAILY AT  6PM What changed:  how much to take  how to take this  when to take this  additional  instructions   triamterene-hydrochlorothiazide 37.5-25 MG tablet Commonly known as:  MAXZIDE-25 Take 1 tablet by mouth daily.       Disposition: Home Discharge Instructions    Diet - low sodium heart healthy    Complete by:  As directed    Increase activity slowly    Complete by:  As directed      Follow-up Information    [place] [facility] Follow up on 02/22/2017.   Specialty:  Cardiology Why:  4:30PM, wound check/lab Contact information: [address], [place]       [name], MD Follow up on 05/21/2017.   Specialty:  Cardiology Why:  11:30AM Contact information: [address] [place] [address] [place] Follow up.   Why:  HHPT, HHaide, HHRN Contact  information: [address] [place]  [address] [phone]           Duration of Discharge Encounter: Greater than 30 minutes including physician time.  Venetia Night, PA-C 02/10/2017 12:39 PM

## 2017-02-09 NOTE — Progress Notes (Signed)
Progress Note  Patient Name: Hunter [name] Date of Encounter: 02/09/2017  Primary Cardiologist: Dr. Stanford Breed  Subjective   Patient is feeling well this AM, no SOB, cough, no CP, palpitations, denies pain at site  Inpatient Medications    Scheduled Meds: . Chlorhexidine Gluconate Cloth  6 each Topical Q0600  . levothyroxine  50 mcg Oral QAC breakfast  . losartan  50 mg Oral Daily  . mupirocin ointment  1 application Nasal BID  . sertraline  200 mg Oral Daily  . simvastatin  40 mg Oral q1800  . triamterene-hydrochlorothiazide  1 tablet Oral Daily   Continuous Infusions: .  ceFAZolin (ANCEF) IV Stopped (02/09/17 0432)   PRN Meds: acetaminophen, nitroGLYCERIN, ondansetron (ZOFRAN) IV   Vital Signs    Vitals:   02/08/17 1930 02/08/17 2000 02/09/17 0100 02/09/17 0800  BP: (!) 175/63 (!) 167/70 (!) 153/48 (!) 167/58  Pulse:  65 63 62  Resp: 16 20 (!) 24 (!) 24  Temp:  98.3 F (36.8 C) 98.3 F (36.8 C) 97.8 F (36.6 C)  TempSrc:  Oral Oral Oral  SpO2:  98% 95% 99%  Weight:   229 lb 0.9 oz (103.9 kg)     Intake/Output Summary (Last 24 hours) at 02/09/17 1011 Last data filed at 02/09/17 0900  Gross per 24 hour  Intake              840 ml  Output              300 ml  Net              540 ml   Filed Weights   02/09/17 0100  Weight: 229 lb 0.9 oz (103.9 kg)    Telemetry    SR, V paced - Personally Reviewed  ECG    SR, V paced - Personally Reviewed  Physical Exam   GEN: No acute distress.   Neck: No JVD Cardiac: RRR, no murmurs, rubs, or gallops.  Respiratory: Clear to auscultation bilaterally. GI: Soft, nontender, non-distended  MS: 1+ edema; No deformity. Neuro:  Nonfocal  Psych: Normal affect   PPM site is stable, no hematoma, bleeding  Labs    [place] labs: 7/30/18K+ 4.0 BUN/Creat 23/1.56 WBC 6.3 H/H 13.6/40.9 Plts 86 Trop T <0.01 TSH 4.92  Radiology    Dg Chest 2 View Result Date: 02/09/2017 CLINICAL DATA:  Pacemaker placement.  EXAM: CHEST  2 VIEW COMPARISON:  12/06/2013 . FINDINGS: Cardiac pacer with lead tips in the right atrium and right ventricle. Heart size normal. Mild bilateral pulmonary interstitial prominence. Mild pneumonitis cannot be excluded. No pleural effusion or pneumothorax. IMPRESSION: 1. Cardiac pacer with lead tips in the right atrium and right ventricle. Stable mild cardiomegaly . 2. Mild bilateral pulmonary interstitial prominence. Mild interstitial edema and/or pneumonitis cannot be excluded. Electronically Signed   By: Marcello Moores  Register   On: 02/09/2017 07:27    Cardiac Studies   08/09/13: TTE Study Conclusions - Left ventricle: The cavity size was normal. Wall thickness was increased in a pattern of mild LVH. Systolic function was normal. The estimated ejection fraction was in the range of 55% to 60%. There was septal-lateral dyssynchrony. Doppler parameters are consistent with abnormal left ventricular relaxation (grade 1 diastolic dysfunction). - Aortic valve: There was no stenosis. - Mitral valve: Trivial regurgitation. - Left atrium: The atrium was mildly dilated. - Right ventricle: The cavity size was normal. Systolic function was normal. - Tricuspid valve: Peak RV-RA gradient: 17mm Hg (S). -  Pulmonary arteries: PA peak pressure: 22mm Hg (S). - Inferior vena cava: The vessel was normal in size; the respirophasic diameter changes were in the normal range (= 50%); findings are consistent with normal central venous pressure. Impressions: - Normal LV size with mild LV hypertrophy. EF 55-60% with septal-lateral dyssynchrony consistent with LBBB. Normal RV size and systolic function. No significant valvular abnormalities.  Patient Profile     83 y.o. male with PMH of HTN, HLD, LBBB, CKD, GERD, CBP spinal stenosis, initially seen at [facility] hospital with near syncopal, possibly syncope found in CHB, transferred to [facility] and is now s/p PPM  Assessment & Plan      1. Syncope vs near syncope, CHB     now s/p PPM, POD #1     Site is stable     This AM CXR without ptx, device check is stable  Given CHB, and pacer dependence this AM, will plan for discharge tomorrow  2. HTN     No meds yesterday, follow today  3. Edema     continue diuretic, anticipate improvement with improved pulse   Signed, Baldwin Jamaica, PA-C  02/09/2017, 10:11 AM

## 2017-02-09 NOTE — Care Management Note (Addendum)
Case Management Note  Patient Details  Name: Hunter [name] MRN: [id number] Date of Birth: 1933/08/07  Subjective/Objective:    From home alone, s/p pacemaker implant,  he has a neighbor , Theda Belfast that he says will be able to assist him with things like going to the store and checking on him and he will also transport him home from hospital.  He states he has a son who lives in [place], but he is a bil amputee, and he also have two other sons that live in [place]. He may  Benefit from pt eval.                   Action/Plan: NCM will cont to follow for dc needs.   Expected Discharge Date:                  Expected Discharge Plan:  [place]  In-House Referral:     Discharge planning Services  CM Consult  Post Acute Care Choice:    Choice offered to:     DME Arranged:    DME Agency:     HH Arranged:    [place] Agency:     Status of Service:  In process, will continue to follow  If discussed at Long Length of Stay Meetings, dates discussed:    Additional Comments:  Hunter Mayo, RN 02/09/2017, 10:58 AM

## 2017-02-09 NOTE — Consult Note (Signed)
[facility] CM Primary Care Navigator  02/09/2017  Hunter [name] [birth year] [id number]   Met with patientat the bedside to identify possible discharge needs. Patient reports having weakness ("no stamina"), shortness of breath and fainting which had led to this admission/ surgery. Patient endorses Dr.Stephen Yong Channel with [employer] [facility] at [place] as the primary care provider.   [facility] [place] to obtain [facility] without any problem.   Patient reports managing his medications at home straight out of the containers using his own organizing system.  He lives alone and was driving prior to admission. His next door neighbor Hunter Brow R.) will be providingtransportation to his doctors'appointments after discharge.  Patient has been living independently (self-sufficient) before this hospitalization. He has a son in [place] (bilateral amputee) and other sons live in [place] list of personal care services provided as hisresourcewhen needed, with the understanding that he will pay out of the pocket for it.Patient was grateful for the resource list provided.  Anticipated discharge plan is home per patient.Marland Kitchen His neighbor will be checking on him as stated.  Will await for therapy evaluation and discharge recommendation for patient.   Patient voiced understanding to call primary care provider's officewhen he returns back home,for a post discharge follow-up appointment within a week or sooner if needs arise.Patient letter (with PCP's contact number) was provided as hisreminder.  Explained to patient about [facility] CM services available for health management and he communicated no further needs or concerns at this time.  [facility] care management information provided for future needs that he may have.   For questions, please contact:  Dannielle Huh, BSN, RN- [facility] Primary Care Navigator  Telephone: [phone] [place]

## 2017-02-10 ENCOUNTER — Other Ambulatory Visit: Payer: Self-pay | Admitting: Physician Assistant

## 2017-02-10 DIAGNOSIS — I130 Hypertensive heart and chronic kidney disease with heart failure and stage 1 through stage 4 chronic kidney disease, or unspecified chronic kidney disease: Secondary | ICD-10-CM

## 2017-02-10 DIAGNOSIS — Z79899 Other long term (current) drug therapy: Secondary | ICD-10-CM

## 2017-02-10 MED ORDER — MUPIROCIN 2 % EX OINT: 1.0000 "application " | TOPICAL_OINTMENT | Freq: Two times a day (BID) | CUTANEOUS | 0 refills | Status: AC

## 2017-02-10 MED ORDER — AMLODIPINE BESYLATE 5 MG PO TABS: 5.0000 mg | ORAL_TABLET | Freq: Every day | ORAL | 6 refills | Status: DC

## 2017-02-10 MED ORDER — TRIAMTERENE-HCTZ 37.5-25 MG PO TABS
1.0000 | ORAL_TABLET | Freq: Every day | ORAL | 3 refills | Status: DC
Start: 2017-02-10 — End: 2018-05-03

## 2017-02-10 MED ORDER — AMLODIPINE BESYLATE 5 MG PO TABS
5.0000 mg | ORAL_TABLET | Freq: Every day | ORAL | Status: DC
  Administered 2017-02-10: 13:00:00 5 mg via ORAL
  Filled 2017-02-10: qty 1

## 2017-02-10 NOTE — Evaluation (Signed)
Physical Therapy Evaluation Patient Details Name: Hunter [name] MRN: [id number] DOB: [birth year] Today's Date: 02/10/2017   History of Present Illness  Hunter [name] is a 83 y.o. male with PMH of HTN, HLD, LBBB, CKD, GERD, CBP spinal stenosis, was at home feeling well when he was adjusting the light switch and suddenly fainted, stated came out of nowhere suddenly fainted, woke hitting the floor. Pt is now s/p pacemaker placement (02/08/17)  Clinical Impression  Pt admitted with above diagnosis. Pt currently with functional limitations due to the deficits listed below (see PT Problem List). Pt will benefit from skilled PT to increase their independence and safety with mobility to allow discharge to the venue listed below.  Pt with limited support system at home and would benefit from HHPT and HHaide if possible.  He has all DME.  Pt was educated on limitations with L UE due to pacemaker and will need continued education.     Follow Up Recommendations Home health PT;Supervision - Intermittent ([place] aide if possible)    Equipment Recommendations  None recommended by PT    Recommendations for Other Services       Precautions / Restrictions Precautions Precautions: Fall Precaution Comments: pacemaker precautions Restrictions Weight Bearing Restrictions: No Other Position/Activity Restrictions: pacemaker precautions      Mobility  Bed Mobility Overal bed mobility: Needs Assistance Bed Mobility: Rolling;Sidelying to Sit Rolling: Min guard Sidelying to sit: Min guard       General bed mobility comments: Pt rolled to R Side and able to push up without L UE when given cues  Transfers Overall transfer level: Needs assistance Equipment used: Rolling walker (2 wheeled) Transfers: Sit to/from Stand Sit to Stand: Supervision         General transfer comment: S for safety, no LOB  Ambulation/Gait Ambulation/Gait assistance: Min guard Ambulation Distance (Feet): 115 Feet Assistive  device: Rolling walker (2 wheeled) Gait Pattern/deviations: Step-through pattern Gait velocity: decreased   General Gait Details: Cues to not WB heavy through L UE and just use RW for steadying.  Pt able to demonstrate that he was using it properly.  Pt fatigue in legs with gait. Pt did not want to attempt ambulation with cane.  Stairs            Wheelchair Mobility    Modified Rankin (Stroke Patients Only)       Balance Overall balance assessment: Needs assistance   Sitting balance-Leahy Scale: Fair     Standing balance support: During functional activity Standing balance-Leahy Scale: Fair Standing balance comment: Pt able to stand and use urinal without UE support                             Pertinent Vitals/Pain Pain Assessment: 0-10 Faces Pain Scale: Hurts a little bit Pain Location: L arm Pain Descriptors / Indicators: Sore Pain Intervention(s): Monitored during session;Limited activity within patient's tolerance    Home Living Family/patient expects to be discharged to:: Private residence Living Arrangements: Alone Available Help at Discharge: Family;Friend(s);Available PRN/intermittently Type of Home: House Home Access: Stairs to enter Entrance Stairs-Rails:  (post) Entrance Stairs-Number of Steps: 3 Home Layout: One level Home Equipment: Walker - 2 wheels;Cane - single point;Wheelchair - manual;Bedside commode      Prior Function Level of Independence: Independent               Hand Dominance        Extremity/Trunk Assessment  Upper Extremity Assessment Upper Extremity Assessment: Overall WFL for tasks assessed    Lower Extremity Assessment Lower Extremity Assessment: Overall WFL for tasks assessed    Cervical / Trunk Assessment Cervical / Trunk Assessment: Normal  Communication   Communication: Expressive difficulties (reports this is because he just woke up)  Cognition Arousal/Alertness: Awake/alert Behavior During  Therapy: WFL for tasks assessed/performed Overall Cognitive Status: No family/caregiver present to determine baseline cognitive functioning Area of Impairment: Problem solving                             Problem Solving: Slow processing General Comments: Pt alert and oriented, but seems a bit delayed with responses.  Difficulty finding words, but pt reports this is because he just woke up.      General Comments      Exercises     Assessment/Plan    PT Assessment Patient needs continued PT services  PT Problem List Decreased strength;Decreased activity tolerance;Decreased balance;Decreased mobility;Decreased knowledge of precautions       PT Treatment Interventions DME instruction;Gait training;Stair training;Functional mobility training;Therapeutic activities;Therapeutic exercise;Balance training;Patient/family education    PT Goals (Current goals can be found in the Care Plan section)  Acute Rehab PT Goals Patient Stated Goal: go home PT Goal Formulation: With patient Time For Goal Achievement: 02/17/17 Potential to Achieve Goals: Good    Frequency Min 3X/week   Barriers to discharge Decreased caregiver support      Co-evaluation               AM-PAC PT "6 Clicks" Daily Activity  Outcome Measure Difficulty turning over in bed (including adjusting bedclothes, sheets and blankets)?: A Little Difficulty moving from lying on back to sitting on the side of the bed? : A Little Difficulty sitting down on and standing up from a chair with arms (e.g., wheelchair, bedside commode, etc,.)?: A Little Help needed moving to and from a bed to chair (including a wheelchair)?: A Little Help needed walking in hospital room?: A Little Help needed climbing 3-5 steps with a railing? : A Little 6 Click Score: 18    End of Session Equipment Utilized During Treatment: Gait belt Activity Tolerance: Patient tolerated treatment well Patient left: Other (comment);with call  bell/phone within reach (on toilet to have BM) Nurse Communication: Mobility status PT Visit Diagnosis: Unsteadiness on feet (R26.81);Difficulty in walking, not elsewhere classified (R26.2)    Time: 2409-7353 PT Time Calculation (min) (ACUTE ONLY): 24 min   Charges:   PT Evaluation $PT Eval Moderate Complexity: 1 Mod PT Treatments $Gait Training: 8-22 mins   PT G Codes:        Phoua Hoadley L. Tamala Julian, [place] Pager [phone] 02/10/2017   Galen Manila 02/10/2017, 11:21 AM

## 2017-02-10 NOTE — Progress Notes (Signed)
Progress Note  Patient Name: Hunter [name] Date of Encounter: 02/10/2017  Primary Cardiologist: Dr. Stanford Breed  Subjective  Feels better with no sob or chest pain   Inpatient Medications    Scheduled Meds: . amitriptyline  25 mg Oral QHS  . Chlorhexidine Gluconate Cloth  6 each Topical Q0600  . levothyroxine  50 mcg Oral QAC breakfast  . losartan  50 mg Oral Daily  . mupirocin ointment  1 application Nasal BID  . sertraline  200 mg Oral Daily  . simvastatin  40 mg Oral q1800  . triamterene-hydrochlorothiazide  1 tablet Oral Daily   Continuous Infusions:  PRN Meds: acetaminophen, nitroGLYCERIN, ondansetron (ZOFRAN) IV   Vital Signs    Vitals:   02/09/17 0800 02/09/17 1300 02/09/17 2014 02/10/17 0653  BP: (!) 167/58 (!) 114/92 (!) 166/60 (!) 162/33  Pulse: 62 63 (!) 59 63  Resp: (!) 24 16 (!) 25 (!) 23  Temp: 97.8 F (36.6 C) 98.6 F (37 C) 97.8 F (36.6 C) (!) 96.4 F (35.8 C)  TempSrc: Oral Oral Oral Oral  SpO2: 99% 97% 97% 93%  Weight:   229 lb 0.9 oz (103.9 kg) 225 lb 12 oz (102.4 kg)  Height:   5\' 11"  (1.803 m)     Intake/Output Summary (Last 24 hours) at 02/10/17 0815 Last data filed at 02/10/17 0657  Gross per 24 hour  Intake              720 ml  Output             5500 ml  Net            -4780 ml   Filed Weights   02/09/17 0100 02/09/17 2014 02/10/17 0653  Weight: 229 lb 0.9 oz (103.9 kg) 229 lb 0.9 oz (103.9 kg) 225 lb 12 oz (102.4 kg)    Telemetry    SR, V paced - Personally Reviewed  ECG    SR, V paced - Personally Reviewed  Physical Exam   GEN: No acute distress.   Neck: No JVD Cardiac: RRR, no murmurs, rubs, or gallops.  Respiratory: Clear to auscultation bilaterally. GI: Soft, nontender, non-distended  MS: 1+ edema; No deformity. Neuro:  Nonfocal  Psych: Normal affect   PPM site is stable, no hematoma, bleeding  Labs    [place] labs: 7/30/18K+ 4.0 BUN/Creat 23/1.56 WBC 6.3 H/H 13.6/40.9 Plts 86 Trop T <0.01 TSH  4.92  Radiology    Dg Chest 2 View Result Date: 02/09/2017 CLINICAL DATA:  Pacemaker placement. EXAM: CHEST  2 VIEW COMPARISON:  12/06/2013 . FINDINGS: Cardiac pacer with lead tips in the right atrium and right ventricle. Heart size normal. Mild bilateral pulmonary interstitial prominence. Mild pneumonitis cannot be excluded. No pleural effusion or pneumothorax. IMPRESSION: 1. Cardiac pacer with lead tips in the right atrium and right ventricle. Stable mild cardiomegaly . 2. Mild bilateral pulmonary interstitial prominence. Mild interstitial edema and/or pneumonitis cannot be excluded. Electronically Signed   By: Marcello Moores  Register   On: 02/09/2017 07:27    Cardiac Studies   08/09/13: TTE Study Conclusions - Left ventricle: The cavity size was normal. Wall thickness was increased in a pattern of mild LVH. Systolic function was normal. The estimated ejection fraction was in the range of 55% to 60%. There was septal-lateral dyssynchrony. Doppler parameters are consistent with abnormal left ventricular relaxation (grade 1 diastolic dysfunction). - Aortic valve: There was no stenosis. - Mitral valve: Trivial regurgitation. - Left atrium: The atrium  was mildly dilated. - Right ventricle: The cavity size was normal. Systolic function was normal. - Tricuspid valve: Peak RV-RA gradient: 65mm Hg (S). - Pulmonary arteries: PA peak pressure: 90mm Hg (S). - Inferior vena cava: The vessel was normal in size; the respirophasic diameter changes were in the normal range (= 50%); findings are consistent with normal central venous pressure. Impressions: - Normal LV size with mild LV hypertrophy. EF 55-60% with septal-lateral dyssynchrony consistent with LBBB. Normal RV size and systolic function. No significant valvular abnormalities.  Patient Profile     83 y.o. male with PMH of HTN, HLD, LBBB, CKD, GERD, CBP spinal stenosis, initially seen at [facility] hospital with near  syncopal, possibly syncope found in CHB, transferred to [facility] and is now s/p PPM  Assessment & Plan    1. Syncope vs near syncope, CHB   2. HTN  3. Edema     Still elevated Will add amlodipine to losartan    Signed, Virl Axe, MD  02/10/2017, 8:15 AM

## 2017-02-10 NOTE — Care Management Note (Addendum)
Case Management Note  Patient Details  Name: NAREK KNISS MRN: [id number] Date of Birth: 09-05-1933  Subjective/Objective:  From home alone, s/p pacemaker implant,  he has a neighbor , Theda Belfast that he says will be able to assist him with things like going to the store and checking on him and he will also transport him home from hospital.  He states he has a son who lives in [place], but he is a bil amputee, and he also have two other sons that live in [place]. He may  Benefit from pt eval. Per pt eval rec HHPT, he will need an aide also, he chose [facility] , referral made to [place], [place] and [place].  Soc will begin 24-48 hrs post dc.                   Action/Plan: NCM will follow for dc.    Expected Discharge Date:                  Expected Discharge Plan:  [place]  In-House Referral:     Discharge planning Services  CM Consult  Post Acute Care Choice:  Home Health Choice offered to:  Patient  DME Arranged:    DME Agency:  [facility]  HH Arranged:  Nurse's Aide, PT [place] Agency:  [place]  Status of Service:  Completed, signed off  If discussed at [place] of Stay Meetings, dates discussed:    Additional Comments:  Zenon Mayo, RN 02/10/2017, 11:46 AM

## 2017-02-11 DIAGNOSIS — M545 Low back pain: Secondary | ICD-10-CM | POA: Diagnosis not present

## 2017-02-11 DIAGNOSIS — I129 Hypertensive chronic kidney disease with stage 1 through stage 4 chronic kidney disease, or unspecified chronic kidney disease: Secondary | ICD-10-CM | POA: Diagnosis not present

## 2017-02-11 DIAGNOSIS — L309 Dermatitis, unspecified: Secondary | ICD-10-CM | POA: Diagnosis not present

## 2017-02-11 DIAGNOSIS — Z95810 Presence of automatic (implantable) cardiac defibrillator: Secondary | ICD-10-CM | POA: Diagnosis not present

## 2017-02-11 DIAGNOSIS — K219 Gastro-esophageal reflux disease without esophagitis: Secondary | ICD-10-CM | POA: Diagnosis not present

## 2017-02-11 DIAGNOSIS — Z85828 Personal history of other malignant neoplasm of skin: Secondary | ICD-10-CM | POA: Diagnosis not present

## 2017-02-11 DIAGNOSIS — M1991 Primary osteoarthritis, unspecified site: Secondary | ICD-10-CM | POA: Diagnosis not present

## 2017-02-11 DIAGNOSIS — E039 Hypothyroidism, unspecified: Secondary | ICD-10-CM | POA: Diagnosis not present

## 2017-02-11 DIAGNOSIS — Z791 Long term (current) use of non-steroidal anti-inflammatories (NSAID): Secondary | ICD-10-CM | POA: Diagnosis not present

## 2017-02-11 DIAGNOSIS — Z7982 Long term (current) use of aspirin: Secondary | ICD-10-CM | POA: Diagnosis not present

## 2017-02-11 DIAGNOSIS — I739 Peripheral vascular disease, unspecified: Secondary | ICD-10-CM | POA: Diagnosis not present

## 2017-02-11 DIAGNOSIS — N189 Chronic kidney disease, unspecified: Secondary | ICD-10-CM | POA: Diagnosis not present

## 2017-02-11 DIAGNOSIS — S41001D Unspecified open wound of right shoulder, subsequent encounter: Secondary | ICD-10-CM | POA: Diagnosis not present

## 2017-02-11 DIAGNOSIS — E785 Hyperlipidemia, unspecified: Secondary | ICD-10-CM | POA: Diagnosis not present

## 2017-02-12 ENCOUNTER — Telehealth: Payer: Self-pay | Admitting: Family Medicine

## 2017-02-12 DIAGNOSIS — L309 Dermatitis, unspecified: Secondary | ICD-10-CM | POA: Diagnosis not present

## 2017-02-12 DIAGNOSIS — K219 Gastro-esophageal reflux disease without esophagitis: Secondary | ICD-10-CM | POA: Diagnosis not present

## 2017-02-12 DIAGNOSIS — S41001D Unspecified open wound of right shoulder, subsequent encounter: Secondary | ICD-10-CM | POA: Diagnosis not present

## 2017-02-12 DIAGNOSIS — E785 Hyperlipidemia, unspecified: Secondary | ICD-10-CM | POA: Diagnosis not present

## 2017-02-12 DIAGNOSIS — I739 Peripheral vascular disease, unspecified: Secondary | ICD-10-CM | POA: Diagnosis not present

## 2017-02-12 DIAGNOSIS — Z7982 Long term (current) use of aspirin: Secondary | ICD-10-CM | POA: Diagnosis not present

## 2017-02-12 DIAGNOSIS — M1991 Primary osteoarthritis, unspecified site: Secondary | ICD-10-CM | POA: Diagnosis not present

## 2017-02-12 DIAGNOSIS — M545 Low back pain: Secondary | ICD-10-CM | POA: Diagnosis not present

## 2017-02-12 DIAGNOSIS — E039 Hypothyroidism, unspecified: Secondary | ICD-10-CM | POA: Diagnosis not present

## 2017-02-12 DIAGNOSIS — Z85828 Personal history of other malignant neoplasm of skin: Secondary | ICD-10-CM | POA: Diagnosis not present

## 2017-02-12 DIAGNOSIS — Z95810 Presence of automatic (implantable) cardiac defibrillator: Secondary | ICD-10-CM | POA: Diagnosis not present

## 2017-02-12 DIAGNOSIS — I129 Hypertensive chronic kidney disease with stage 1 through stage 4 chronic kidney disease, or unspecified chronic kidney disease: Secondary | ICD-10-CM | POA: Diagnosis not present

## 2017-02-12 DIAGNOSIS — Z791 Long term (current) use of non-steroidal anti-inflammatories (NSAID): Secondary | ICD-10-CM | POA: Diagnosis not present

## 2017-02-12 DIAGNOSIS — N189 Chronic kidney disease, unspecified: Secondary | ICD-10-CM | POA: Diagnosis not present

## 2017-02-12 NOTE — Telephone Encounter (Signed)
He has a couple of medication that interact with each other and they we needing if they need to change the medication or just leave it the same?  amLODipine (NORVASC) 5 MG tablet   simvastatin (ZOCOR) 40 MG tablet    Please advise

## 2017-02-12 NOTE — Telephone Encounter (Signed)
Yes thanks- please change simvastatin 40mg  to atorvastatin 20mg  daily #90 with 3 refills (as long as no allergy) and inform patient

## 2017-02-13 DIAGNOSIS — E039 Hypothyroidism, unspecified: Secondary | ICD-10-CM | POA: Diagnosis not present

## 2017-02-13 DIAGNOSIS — I739 Peripheral vascular disease, unspecified: Secondary | ICD-10-CM | POA: Diagnosis not present

## 2017-02-13 DIAGNOSIS — E785 Hyperlipidemia, unspecified: Secondary | ICD-10-CM | POA: Diagnosis not present

## 2017-02-13 DIAGNOSIS — N189 Chronic kidney disease, unspecified: Secondary | ICD-10-CM | POA: Diagnosis not present

## 2017-02-13 DIAGNOSIS — Z95810 Presence of automatic (implantable) cardiac defibrillator: Secondary | ICD-10-CM | POA: Diagnosis not present

## 2017-02-13 DIAGNOSIS — Z791 Long term (current) use of non-steroidal anti-inflammatories (NSAID): Secondary | ICD-10-CM | POA: Diagnosis not present

## 2017-02-13 DIAGNOSIS — Z85828 Personal history of other malignant neoplasm of skin: Secondary | ICD-10-CM | POA: Diagnosis not present

## 2017-02-13 DIAGNOSIS — S41001D Unspecified open wound of right shoulder, subsequent encounter: Secondary | ICD-10-CM | POA: Diagnosis not present

## 2017-02-13 DIAGNOSIS — K219 Gastro-esophageal reflux disease without esophagitis: Secondary | ICD-10-CM | POA: Diagnosis not present

## 2017-02-13 DIAGNOSIS — M1991 Primary osteoarthritis, unspecified site: Secondary | ICD-10-CM | POA: Diagnosis not present

## 2017-02-13 DIAGNOSIS — I129 Hypertensive chronic kidney disease with stage 1 through stage 4 chronic kidney disease, or unspecified chronic kidney disease: Secondary | ICD-10-CM | POA: Diagnosis not present

## 2017-02-13 DIAGNOSIS — Z7982 Long term (current) use of aspirin: Secondary | ICD-10-CM | POA: Diagnosis not present

## 2017-02-13 DIAGNOSIS — L309 Dermatitis, unspecified: Secondary | ICD-10-CM | POA: Diagnosis not present

## 2017-02-13 DIAGNOSIS — M545 Low back pain: Secondary | ICD-10-CM | POA: Diagnosis not present

## 2017-02-15 DIAGNOSIS — Z95810 Presence of automatic (implantable) cardiac defibrillator: Secondary | ICD-10-CM | POA: Diagnosis not present

## 2017-02-15 DIAGNOSIS — I129 Hypertensive chronic kidney disease with stage 1 through stage 4 chronic kidney disease, or unspecified chronic kidney disease: Secondary | ICD-10-CM | POA: Diagnosis not present

## 2017-02-16 ENCOUNTER — Encounter: Payer: Self-pay | Admitting: Family Medicine

## 2017-02-16 ENCOUNTER — Ambulatory Visit (INDEPENDENT_AMBULATORY_CARE_PROVIDER_SITE_OTHER): Payer: Medicare Other | Admitting: Family Medicine

## 2017-02-16 VITALS — BP 136/68 | HR 78 | Temp 98.2°F | Ht 71.0 in | Wt 227.4 lb

## 2017-02-16 DIAGNOSIS — E785 Hyperlipidemia, unspecified: Secondary | ICD-10-CM | POA: Diagnosis not present

## 2017-02-16 DIAGNOSIS — M545 Low back pain: Secondary | ICD-10-CM | POA: Diagnosis not present

## 2017-02-16 DIAGNOSIS — Z95810 Presence of automatic (implantable) cardiac defibrillator: Secondary | ICD-10-CM | POA: Diagnosis not present

## 2017-02-16 DIAGNOSIS — I129 Hypertensive chronic kidney disease with stage 1 through stage 4 chronic kidney disease, or unspecified chronic kidney disease: Secondary | ICD-10-CM | POA: Diagnosis not present

## 2017-02-16 DIAGNOSIS — M1991 Primary osteoarthritis, unspecified site: Secondary | ICD-10-CM | POA: Diagnosis not present

## 2017-02-16 DIAGNOSIS — F324 Major depressive disorder, single episode, in partial remission: Secondary | ICD-10-CM | POA: Diagnosis not present

## 2017-02-16 DIAGNOSIS — I10 Essential (primary) hypertension: Secondary | ICD-10-CM | POA: Diagnosis not present

## 2017-02-16 DIAGNOSIS — N189 Chronic kidney disease, unspecified: Secondary | ICD-10-CM | POA: Diagnosis not present

## 2017-02-16 DIAGNOSIS — I442 Atrioventricular block, complete: Secondary | ICD-10-CM

## 2017-02-16 DIAGNOSIS — Z85828 Personal history of other malignant neoplasm of skin: Secondary | ICD-10-CM | POA: Diagnosis not present

## 2017-02-16 DIAGNOSIS — Z7982 Long term (current) use of aspirin: Secondary | ICD-10-CM | POA: Diagnosis not present

## 2017-02-16 DIAGNOSIS — E039 Hypothyroidism, unspecified: Secondary | ICD-10-CM | POA: Diagnosis not present

## 2017-02-16 DIAGNOSIS — Z791 Long term (current) use of non-steroidal anti-inflammatories (NSAID): Secondary | ICD-10-CM | POA: Diagnosis not present

## 2017-02-16 DIAGNOSIS — K219 Gastro-esophageal reflux disease without esophagitis: Secondary | ICD-10-CM | POA: Diagnosis not present

## 2017-02-16 DIAGNOSIS — L309 Dermatitis, unspecified: Secondary | ICD-10-CM | POA: Diagnosis not present

## 2017-02-16 DIAGNOSIS — S41001D Unspecified open wound of right shoulder, subsequent encounter: Secondary | ICD-10-CM | POA: Diagnosis not present

## 2017-02-16 DIAGNOSIS — I739 Peripheral vascular disease, unspecified: Secondary | ICD-10-CM | POA: Diagnosis not present

## 2017-02-16 NOTE — Telephone Encounter (Signed)
Addressed in an office visit today with Dr. Yong Channel

## 2017-02-16 NOTE — Progress Notes (Signed)
Subjective:  Hunter [name] is a 83 y.o. year old very pleasant male patient who presents for/with See problem oriented charting ROS- no more syncopal episodes. Occasional itchy scalp. Stable edema. No chest pain.    Past Medical History-  Patient Active Problem List   Diagnosis Date Noted  . Complete heart block ([place]) 02/08/2017    Priority: High  . Benign hypertrophy of prostate 05/10/2015    Priority: High  . Shortness of breath 12/06/2013    Priority: High  . 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 Edema 08/15/2008    Priority: High  . Thrombocytopenia ([place]) 05/23/2012    Priority: Medium  . Neuropathy ([place]) 11/02/2011    Priority: Medium  . CKD (chronic kidney disease), stage III 05/27/2011    Priority: Medium  . Memory loss 04/08/2011    Priority: Medium  . Hypothyroidism 08/15/2008    Priority: Medium  . ANEMIA 08/15/2008    Priority: Medium  . Hyperlipidemia 02/02/2007    Priority: Medium  . Major depression in partial remission ([place]) 02/02/2007    Priority: Medium  . Essential hypertension 02/02/2007    Priority: Medium  . Skin lesion of left lower extremity 03/26/2014    Priority: Low  . Stasis eczema 03/26/2014    Priority: Low  . History of skin cancer 03/26/2014    Priority: Low  . Syncope 08/14/2013    Priority: Low  . Low back pain 04/08/2011    Priority: Low  . LBBB (left bundle branch block) 05/12/2010    Priority: Low  . GAIT IMBALANCE 04/23/2010    Priority: Low  . SPINAL STENOSIS, CERVICAL 03/21/2010    Priority: Low  . Constipation 03/19/2010    Priority: Low  . Urinary frequency 09/05/2009    Priority: Low  . Vitamin D deficiency 08/15/2008    Priority: Low  . Allergic rhinitis 02/02/2007    Priority: Low  . GERD 02/02/2007    Priority: Low    Medications- reviewed and updated Current Outpatient Prescriptions  Medication Sig Dispense Refill  . acyclovir ointment (ZOVIRAX) 5 % Apply 1  application topically every 3 (three) hours. 5x a day For 5 days for genital herpes 30 g 1  . amitriptyline (ELAVIL) 25 MG tablet Take 1 tablet (25 mg total) by mouth at bedtime. 90 tablet 1  . amLODipine (NORVASC) 5 MG tablet Take 1 tablet (5 mg total) by mouth daily. 30 tablet 6  . aspirin 81 MG tablet Take 81 mg by mouth daily.     . Cyanocobalamin (B-12 PO) Take 1 tablet by mouth daily.    Marland Kitchen levothyroxine (SYNTHROID, LEVOTHROID) 50 MCG tablet TAKE ONE TABLET BY MOUTH ONCE DAILY BEFORE BREAKFAST (Patient taking differently: TAKE 50 MG BY MOUTH ONCE DAILY BEFORE BREAKFAST) 90 tablet 1  . losartan (COZAAR) 50 MG tablet TAKE ONE TABLET BY MOUTH ONCE DAILY (Patient taking differently: Take 50 mg by mouth daily. ) 90 tablet 1  . naproxen sodium (ANAPROX) 220 MG tablet Take 220 mg by mouth daily as needed (for pain or headache).     Marland Kitchen oxybutynin (DITROPAN) 5 MG tablet Take 5 mg by mouth 3 (three) times daily.    . ranitidine (ZANTAC) 150 MG tablet Take 1 tablet (150 mg total) by mouth 2 (two) times daily as needed for heartburn. 180 tablet 1  . sertraline (ZOLOFT) 100 MG tablet Take 2 tablets (200 mg total) by mouth daily. 180 tablet 0  .  simvastatin (ZOCOR) 40 MG tablet TAKE ONE TABLET BY MOUTH ONCE DAILY AT  6PM (Patient taking differently: Take 40 mg by mouth daily at 6 PM. ) 90 tablet 1  . triamterene-hydrochlorothiazide (MAXZIDE-25) 37.5-25 MG tablet Take 1 tablet by mouth daily. 30 tablet 3   No current facility-administered medications for this visit.     Objective: BP 136/68 (BP Location: Left Arm, Patient Position: Sitting, Cuff Size: Large)   Pulse 78   Temp 98.2 F (36.8 C) (Oral)   Ht 5\' 11"  (1.803 m)   Wt 227 lb 6.4 oz (103.1 kg)   SpO2 97%   BMI 31.72 kg/m  Gen: NAD, resting comfortably Hair: no nits. Does have some dandruff at base of hairs.  CV: RRR no murmurs rubs or gallops Lungs: CTAB no crackles, wheeze, rhonchi Abdomen: soft/nontender/nondistended/normal bowel sounds.  obese Ext: 1+ pitting edema Skin: warm, dry  Assessment/Plan:  Advised to Go to vascular for compression stockings. Declines again  Mentions "lice for months". Has never tried permethrin. I do not see any visible but discussed trial of treatment then repeat in 10 days. He does not have nits or lice on exam but instead appears to have dandruff- encouraged use of selsun blue on regular basis.    Essential hypertension S: controlled on losartan 50mg  and maxzide 37.5-25mg . Amlodipine was added as well during hospitalizatoin BP Readings from Last 3 Encounters:  02/16/17 136/68  02/10/17 (!) 175/58  01/05/17 134/64  A/P: We discussed blood pressure goal of <140/90. Continue current meds  Hyperlipidemia S: unclear level controlled on simvastatin 40mg - was changed to atorvastastatin 20mg  last week- Roselyn Reef had not yet called him so I informed him today. No myalgias.   A/P: will need to update lipids future visit as not yet changed medicine- he will go pick up today  Major depression in partial remission ([place]) S: depression has improved with better sleep on amitriptyline 25mg  (has only used a few times) in addition to zoloft 200mg . Still some anhedonia at times. No SI A/P: continue zoloft 200mg  with prn amitriptyline for sleep. No signs serotonin syndrome  Complete heart block (HCC) S: also updates me seen in ED in Late July and ultimately had STM dual chamber PPM placed by Dr. Caryl Comes. After syncopal event and being found in complete heart block at outside hospital.  A/P: doing well with pacemaker- continue follow up with Dr. Caryl Comes  3 months  Return precautions advised.  Garret Reddish, MD

## 2017-02-16 NOTE — Assessment & Plan Note (Signed)
S: controlled on losartan 50mg  and maxzide 37.5-25mg . Amlodipine was added as well during hospitalizatoin BP Readings from Last 3 Encounters:  02/16/17 136/68  02/10/17 (!) 175/58  01/05/17 134/64  A/P: We discussed blood pressure goal of <140/90. Continue current meds

## 2017-02-16 NOTE — Patient Instructions (Signed)
No changes today  Would use selsun blue at least every other day. Use permethrin from pharmacy today and then repeat in 10 days. Use on cat as well if ok with vet.   Permethrin lotion What is this medicine? PERMETHRIN (per METH rin) is used to treat head lice infestations. It acts by destroying both the lice and their eggs. This medicine may be used for other purposes; ask your health care provider or pharmacist if you have questions. COMMON BRAND NAME(S): Nix Complete Lice Elimination Kit, Nix Lice Killing Creme Rinse What should I tell my health care provider before I take this medicine? They need to know if you have any of these conditions: -asthma -an unusual or allergic reaction to permethrin, veterinary or household insecticides, other medicines, chrysanthemums, foods, dyes, or preservatives -pregnant or trying to get pregnant -breast-feeding How should I use this medicine? This medicine is for external use only. Do not take by mouth. Follow the directions on the product label. Do not wash hair with conditioner or a combination shampoo-conditioner immediately before applying this medicine. Follow product-specific instructions for length of application and product removal. All products should be rinsed from the hair over a sink or tub to limit skin exposure; do not use hot water. In general, do not re-wash the hair for 1 to 2 days after use. Talk to your pediatrician regarding the use of this medicine in children. While this drug may be prescribed for children as young as 15 months old for selected conditions, precautions do apply. Overdosage: If you think you have taken too much of this medicine contact a poison control center or emergency room at once. NOTE: This medicine is only for you. Do not share this medicine with others. What if I miss a dose? This does not apply. What may interact with this medicine? Interactions are not expected. Do not use any other skin products on the  affected area without telling your doctor or health care professional. This list may not describe all possible interactions. Give your health care provider a list of all the medicines, herbs, non-prescription drugs, or dietary supplements you use. Also tell them if you smoke, drink alcohol, or use illegal drugs. Some items may interact with your medicine. What should I watch for while using this medicine? This medicine is used as a single application treatment. If live lice are observed 7 or more days after initial application, a second treatment may be needed. Head lice can be spread from one person to another by direct contact with clothing, hats, scarves, bedding, towels, washcloths, hairbrushes, and combs. All members of your household should be examined for head lice and should receive treatment if they are found to be infected. If you have any questions about this, check with your doctor or health care professional. To prevent reinfection or spreading of the infection, the following steps should be taken: Machine wash all clothing, bedding, towels, and washcloths in very hot water and dry them using the hot cycle of a dryer for at least 20 minutes. Clothing or bedding that cannot be washed should be dry cleaned or sealed in an airtight plastic bag for 2 weeks. Shampoo any wigs or hairpieces. You should also wash all hairbrushes and combs in very hot soapy water (above 130 degrees F) for 5 to 10 minutes. Do not share your hairbrushes or combs with other people. Wash all toys in very hot water (above 130 degrees F) for 5 to 10 minutes or seal in an airtight  plastic bag for 2 weeks. Also, clean the house or room by vacuuming furniture, rugs, and floors. What side effects may I notice from receiving this medicine? Side effects that usually do not require medical attention (report to your doctor or health care professional if they continue or are bothersome): -itching -redness or mild swelling of the  scalp -stinging or burning -tingling sensation This list may not describe all possible side effects. Call your doctor for medical advice about side effects. You may report side effects to FDA at 1-800-FDA-1088. Where should I keep my medicine? Keep out of the reach of children. Store at room temperature away from heat and direct light. Do not refrigerate or freeze. After treatment, throw away any unused medicine. NOTE: This sheet is a summary. It may not cover all possible information. If you have questions about this medicine, talk to your doctor, pharmacist, or health care provider.  2018 Elsevier/Gold Standard (2016-01-24 11:51:24)

## 2017-02-16 NOTE — Assessment & Plan Note (Signed)
S: depression has improved with better sleep on amitriptyline 25mg  (has only used a few times) in addition to zoloft 200mg . Still some anhedonia at times. No SI A/P: continue zoloft 200mg  with prn amitriptyline for sleep. No signs serotonin syndrome

## 2017-02-16 NOTE — Assessment & Plan Note (Signed)
S: unclear level controlled on simvastatin 40mg - was changed to atorvastastatin 20mg  last week- Hunter [name] had not yet called him so I informed him today. No myalgias.   A/P: will need to update lipids future visit as not yet changed medicine- he will go pick up today

## 2017-02-16 NOTE — Assessment & Plan Note (Signed)
S: also updates me seen in ED in Late July and ultimately had STM dual chamber PPM placed by Dr. Caryl Comes. After syncopal event and being found in complete heart block at outside hospital.  A/P: doing well with pacemaker- continue follow up with Dr. Caryl Comes

## 2017-02-17 DIAGNOSIS — M545 Low back pain: Secondary | ICD-10-CM | POA: Diagnosis not present

## 2017-02-17 DIAGNOSIS — S41001D Unspecified open wound of right shoulder, subsequent encounter: Secondary | ICD-10-CM | POA: Diagnosis not present

## 2017-02-17 DIAGNOSIS — Z85828 Personal history of other malignant neoplasm of skin: Secondary | ICD-10-CM | POA: Diagnosis not present

## 2017-02-17 DIAGNOSIS — I739 Peripheral vascular disease, unspecified: Secondary | ICD-10-CM | POA: Diagnosis not present

## 2017-02-17 DIAGNOSIS — L309 Dermatitis, unspecified: Secondary | ICD-10-CM | POA: Diagnosis not present

## 2017-02-17 DIAGNOSIS — E785 Hyperlipidemia, unspecified: Secondary | ICD-10-CM | POA: Diagnosis not present

## 2017-02-17 DIAGNOSIS — N189 Chronic kidney disease, unspecified: Secondary | ICD-10-CM | POA: Diagnosis not present

## 2017-02-17 DIAGNOSIS — M1991 Primary osteoarthritis, unspecified site: Secondary | ICD-10-CM | POA: Diagnosis not present

## 2017-02-17 DIAGNOSIS — Z7982 Long term (current) use of aspirin: Secondary | ICD-10-CM | POA: Diagnosis not present

## 2017-02-17 DIAGNOSIS — K219 Gastro-esophageal reflux disease without esophagitis: Secondary | ICD-10-CM | POA: Diagnosis not present

## 2017-02-17 DIAGNOSIS — Z791 Long term (current) use of non-steroidal anti-inflammatories (NSAID): Secondary | ICD-10-CM | POA: Diagnosis not present

## 2017-02-17 DIAGNOSIS — E039 Hypothyroidism, unspecified: Secondary | ICD-10-CM | POA: Diagnosis not present

## 2017-02-17 DIAGNOSIS — Z95810 Presence of automatic (implantable) cardiac defibrillator: Secondary | ICD-10-CM | POA: Diagnosis not present

## 2017-02-17 DIAGNOSIS — I129 Hypertensive chronic kidney disease with stage 1 through stage 4 chronic kidney disease, or unspecified chronic kidney disease: Secondary | ICD-10-CM | POA: Diagnosis not present

## 2017-02-18 DIAGNOSIS — I739 Peripheral vascular disease, unspecified: Secondary | ICD-10-CM | POA: Diagnosis not present

## 2017-02-18 DIAGNOSIS — S41001D Unspecified open wound of right shoulder, subsequent encounter: Secondary | ICD-10-CM | POA: Diagnosis not present

## 2017-02-18 DIAGNOSIS — M1991 Primary osteoarthritis, unspecified site: Secondary | ICD-10-CM | POA: Diagnosis not present

## 2017-02-18 DIAGNOSIS — E039 Hypothyroidism, unspecified: Secondary | ICD-10-CM | POA: Diagnosis not present

## 2017-02-18 DIAGNOSIS — Z95810 Presence of automatic (implantable) cardiac defibrillator: Secondary | ICD-10-CM | POA: Diagnosis not present

## 2017-02-18 DIAGNOSIS — E785 Hyperlipidemia, unspecified: Secondary | ICD-10-CM | POA: Diagnosis not present

## 2017-02-18 DIAGNOSIS — Z791 Long term (current) use of non-steroidal anti-inflammatories (NSAID): Secondary | ICD-10-CM | POA: Diagnosis not present

## 2017-02-18 DIAGNOSIS — K219 Gastro-esophageal reflux disease without esophagitis: Secondary | ICD-10-CM | POA: Diagnosis not present

## 2017-02-18 DIAGNOSIS — Z7982 Long term (current) use of aspirin: Secondary | ICD-10-CM | POA: Diagnosis not present

## 2017-02-18 DIAGNOSIS — N189 Chronic kidney disease, unspecified: Secondary | ICD-10-CM | POA: Diagnosis not present

## 2017-02-18 DIAGNOSIS — M545 Low back pain: Secondary | ICD-10-CM | POA: Diagnosis not present

## 2017-02-18 DIAGNOSIS — L309 Dermatitis, unspecified: Secondary | ICD-10-CM | POA: Diagnosis not present

## 2017-02-18 DIAGNOSIS — Z85828 Personal history of other malignant neoplasm of skin: Secondary | ICD-10-CM | POA: Diagnosis not present

## 2017-02-18 DIAGNOSIS — I129 Hypertensive chronic kidney disease with stage 1 through stage 4 chronic kidney disease, or unspecified chronic kidney disease: Secondary | ICD-10-CM | POA: Diagnosis not present

## 2017-02-22 ENCOUNTER — Ambulatory Visit (INDEPENDENT_AMBULATORY_CARE_PROVIDER_SITE_OTHER): Payer: Medicare Other | Admitting: *Deleted

## 2017-02-22 ENCOUNTER — Other Ambulatory Visit: Payer: Medicare Other | Admitting: *Deleted

## 2017-02-22 DIAGNOSIS — I442 Atrioventricular block, complete: Secondary | ICD-10-CM | POA: Diagnosis not present

## 2017-02-22 DIAGNOSIS — H2513 Age-related nuclear cataract, bilateral: Secondary | ICD-10-CM | POA: Diagnosis not present

## 2017-02-22 DIAGNOSIS — H04123 Dry eye syndrome of bilateral lacrimal glands: Secondary | ICD-10-CM | POA: Diagnosis not present

## 2017-02-22 DIAGNOSIS — Z79899 Other long term (current) drug therapy: Secondary | ICD-10-CM

## 2017-02-22 DIAGNOSIS — H524 Presbyopia: Secondary | ICD-10-CM | POA: Diagnosis not present

## 2017-02-22 LAB — BASIC METABOLIC PANEL
BUN/Creatinine Ratio: 16 (ref 10–24)
BUN: 20 mg/dL (ref 8–27)
CO2: 23 mmol/L (ref 20–29)
Calcium: 9.7 mg/dL (ref 8.6–10.2)
Chloride: 104 mmol/L (ref 96–106)
Creatinine, Ser: 1.26 mg/dL (ref 0.76–1.27)
GFR calc Af Amer: 62 mL/min/{1.73_m2} (ref >59)
GFR calc non Af Amer: 54 mL/min/{1.73_m2} — ABNORMAL LOW (ref >59)
Glucose: 120 mg/dL — ABNORMAL HIGH (ref 65–99)
Potassium: 4.4 mmol/L (ref 3.5–5.2)
Sodium: 143 mmol/L (ref 134–144)

## 2017-02-22 LAB — CUP PACEART INCLINIC DEVICE CHECK
Battery Remaining Longevity: 64 mo
Battery Voltage: 3.04 V
Brady Statistic RA Percent Paced: 44 %
Brady Statistic RV Percent Paced: 99.95 %
Date Time Interrogation Session: 20180813171102
Implantable Lead Implant Date: 20180730
Implantable Lead Implant Date: 20180730
Implantable Lead Location: 753859
Implantable Lead Location: 753860
Implantable Lead Model: 5076
Implantable Lead Model: 5076
Implantable Pulse Generator Implant Date: 20180730
Lead Channel Impedance Value: 425 Ohm
Lead Channel Impedance Value: 487.5 Ohm
Lead Channel Pacing Threshold Amplitude: 0.5 V
Lead Channel Pacing Threshold Amplitude: 0.5 V
Lead Channel Pacing Threshold Amplitude: 0.75 V
Lead Channel Pacing Threshold Amplitude: 0.75 V
Lead Channel Pacing Threshold Pulse Width: 0.5 ms
Lead Channel Pacing Threshold Pulse Width: 0.5 ms
Lead Channel Pacing Threshold Pulse Width: 0.5 ms
Lead Channel Pacing Threshold Pulse Width: 0.5 ms
Lead Channel Sensing Intrinsic Amplitude: 1.9 mV
Lead Channel Setting Pacing Amplitude: 3.5 V
Lead Channel Setting Pacing Amplitude: 3.5 V
Lead Channel Setting Pacing Pulse Width: 0.5 ms
Lead Channel Setting Sensing Sensitivity: 4 mV
Pulse Gen Model: 2272
Pulse Gen Serial Number: 8930553

## 2017-02-22 NOTE — Progress Notes (Signed)
Wound check appointment. Dermabond removed. Wound without redness or edema. Incision edges approximated, wound well healed. Normal device function. Thresholds, sensing, and impedances consistent with implant measurements. Device programmed at 3.5V for extra safety margin until 3 month visit. Histogram distribution appropriate for patient and level of activity. No mode switches or high ventricular rates noted. Patient educated about wound care, arm mobility, lifting restrictions. ROV in 3 months with JA/E.

## 2017-02-23 DIAGNOSIS — Z791 Long term (current) use of non-steroidal anti-inflammatories (NSAID): Secondary | ICD-10-CM | POA: Diagnosis not present

## 2017-02-23 DIAGNOSIS — E039 Hypothyroidism, unspecified: Secondary | ICD-10-CM | POA: Diagnosis not present

## 2017-02-23 DIAGNOSIS — Z85828 Personal history of other malignant neoplasm of skin: Secondary | ICD-10-CM | POA: Diagnosis not present

## 2017-02-23 DIAGNOSIS — K219 Gastro-esophageal reflux disease without esophagitis: Secondary | ICD-10-CM | POA: Diagnosis not present

## 2017-02-23 DIAGNOSIS — E785 Hyperlipidemia, unspecified: Secondary | ICD-10-CM | POA: Diagnosis not present

## 2017-02-23 DIAGNOSIS — M1991 Primary osteoarthritis, unspecified site: Secondary | ICD-10-CM | POA: Diagnosis not present

## 2017-02-23 DIAGNOSIS — S41001D Unspecified open wound of right shoulder, subsequent encounter: Secondary | ICD-10-CM | POA: Diagnosis not present

## 2017-02-23 DIAGNOSIS — L309 Dermatitis, unspecified: Secondary | ICD-10-CM | POA: Diagnosis not present

## 2017-02-23 DIAGNOSIS — I129 Hypertensive chronic kidney disease with stage 1 through stage 4 chronic kidney disease, or unspecified chronic kidney disease: Secondary | ICD-10-CM | POA: Diagnosis not present

## 2017-02-23 DIAGNOSIS — Z95810 Presence of automatic (implantable) cardiac defibrillator: Secondary | ICD-10-CM | POA: Diagnosis not present

## 2017-02-23 DIAGNOSIS — N189 Chronic kidney disease, unspecified: Secondary | ICD-10-CM | POA: Diagnosis not present

## 2017-02-23 DIAGNOSIS — M545 Low back pain: Secondary | ICD-10-CM | POA: Diagnosis not present

## 2017-02-23 DIAGNOSIS — Z7982 Long term (current) use of aspirin: Secondary | ICD-10-CM | POA: Diagnosis not present

## 2017-02-23 DIAGNOSIS — I739 Peripheral vascular disease, unspecified: Secondary | ICD-10-CM | POA: Diagnosis not present

## 2017-04-02 DIAGNOSIS — H04123 Dry eye syndrome of bilateral lacrimal glands: Secondary | ICD-10-CM | POA: Diagnosis not present

## 2017-05-20 ENCOUNTER — Encounter: Payer: Self-pay | Admitting: *Deleted

## 2017-05-21 ENCOUNTER — Other Ambulatory Visit: Payer: Self-pay

## 2017-05-21 ENCOUNTER — Encounter: Payer: Self-pay | Admitting: Internal Medicine

## 2017-05-21 ENCOUNTER — Ambulatory Visit: Payer: Medicare Other | Admitting: Internal Medicine

## 2017-05-21 VITALS — BP 120/74 | HR 76 | Ht 70.0 in | Wt 233.0 lb

## 2017-05-21 DIAGNOSIS — I442 Atrioventricular block, complete: Secondary | ICD-10-CM

## 2017-05-21 DIAGNOSIS — I10 Essential (primary) hypertension: Secondary | ICD-10-CM | POA: Diagnosis not present

## 2017-05-21 NOTE — Progress Notes (Signed)
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  Since his recent PPM implantation, the patient reports doing very well.  Denies procedure related complications.  Today, he denies symptoms of palpitations, chest pain, shortness of breath,  lower extremity edema, dizziness, presyncope, or syncope.  The patient is otherwise without complaint today.   Past Medical History:  Diagnosis Date  . Anemia   . Anxiety   . Arthritis    "knees" (02/08/2017)  . Chronic lower back pain   . Complete heart block ([place]) 02/08/2017  . Depression   . Eczema   . GERD (gastroesophageal reflux disease) occasional  . Hearing loss of both ears   . Hx of echocardiogram    Echo (07/2013): Mild LVH, EF 19-41%, grade 1 diastolic dysfunction, mild LAE, PASP 23  . Hyperlipidemia   . Hypertension   . Hypothyroidism   . LBBB (left bundle branch block)   . Left patella fracture 2018   "no OR" (02/08/2017)  . Nocturia   . Peripheral vascular disease (HCC) LOWER EXTREMITIES  . Squamous cell skin cancer, penis: glans ([place]) 05/2010   Initial excision 11/11; recurrence, excision and laser Rx 9/13   Past Surgical History:  Procedure Laterality Date  . CIRCUMCISION/ LASER DISSECTION PENILE GLANS CANCER  06-02-2010  . EXCISION RIGHT WRIST BENIGN TUMOR  2002 (APPROX)  . INCISION AND DRAINAGE DEEP NECK ABSCESS    . INGUINAL HERNIA REPAIR  1970's  . INSERT / REPLACE / REMOVE PACEMAKER  02/08/2017  . KNEE ARTHROSCOPY Right 2017  . PENILE BX  05-09-2010  . TONSILLECTOMY      ROS- all systems are reviewed and negative except as per HPI above  Current Outpatient Medications  Medication Sig Dispense Refill  . acyclovir ointment (ZOVIRAX) 5 % Apply 1 application topically every 3 (three) hours. 5x a day For 5 days for genital herpes 30 g 1  . amLODipine (NORVASC) 5 MG tablet Take 1 tablet (5 mg total) by mouth daily. 30  tablet 6  . aspirin 81 MG tablet Take 81 mg by mouth daily.     . Cyanocobalamin (B-12 PO) Take 1 tablet by mouth daily.    Marland Kitchen levothyroxine (SYNTHROID, LEVOTHROID) 50 MCG tablet TAKE ONE TABLET BY MOUTH ONCE DAILY BEFORE BREAKFAST (Patient taking differently: TAKE 50 MG BY MOUTH ONCE DAILY BEFORE BREAKFAST) 90 tablet 1  . losartan (COZAAR) 50 MG tablet TAKE ONE TABLET BY MOUTH ONCE DAILY (Patient taking differently: Take 50 mg by mouth daily. ) 90 tablet 1  . naproxen sodium (ANAPROX) 220 MG tablet Take 220 mg by mouth daily as needed (for pain or headache).     Marland Kitchen oxybutynin (DITROPAN) 5 MG tablet Take 5 mg by mouth 3 (three) times daily.    . ranitidine (ZANTAC) 150 MG tablet Take 1 tablet (150 mg total) by mouth 2 (two) times daily as needed for heartburn. 180 tablet 1  . sertraline (ZOLOFT) 100 MG tablet Take 2 tablets (200 mg total) by mouth daily. 180 tablet 0  . simvastatin (ZOCOR) 40 MG tablet TAKE ONE TABLET BY MOUTH ONCE DAILY AT  6PM (Patient taking differently: Take 40 mg by mouth daily at 6 PM. ) 90 tablet 1  . triamterene-hydrochlorothiazide (MAXZIDE-25) 37.5-25 MG tablet Take 1 tablet by mouth daily. 30 tablet 3   No current facility-administered medications for this visit.     Physical  Exam: Vitals:   05/21/17 1126  BP: 120/74  Pulse: 76  SpO2: 95%  Weight: 105.7 kg (233 lb)  Height: 5\' 10"  (1.778 m)    GEN- The patient is well appearing, alert and oriented x 3 today.   Head- normocephalic, atraumatic Eyes-  Sclera clear, conjunctiva pink Ears- hearing intact Oropharynx- clear Lungs- Clear to ausculation bilaterally, normal work of breathing Chest- pacemaker pocket is well healed Heart- Regular rate and rhythm, no murmurs, rubs or gallops, PMI not laterally displaced GI- soft, NT, ND, + BS Extremities- no clubbing, cyanosis, or edema  Pacemaker interrogation- reviewed in detail today,  See PACEART report    Assessment and Plan:  1. Symptomatic complete heart  block with syncope Normal pacemaker function See Pace Art report No changes today No further syncope  2. HTN Stable No change required today  [place] Return in a year  [name] MD, [facility] 05/21/2017 12:06 PM

## 2017-05-21 NOTE — Patient Instructions (Signed)
Medication Instructions:  Continue all current medications.  Labwork: none  Testing/Procedures: none  Follow-Up: Your physician wants you to follow up in:  1 year.  You will receive a reminder letter in the mail one-two months in advance.  If you don't receive a letter, please call our office to schedule the follow up appointment   Any Other Special Instructions Will Be Listed Below (If Applicable). Remote monitoring is used to monitor your Pacemaker of ICD from home. This monitoring reduces the number of office visits required to check your device to one time per year. It allows [place] to keep an eye on the functioning of your device to ensure it is working properly. You are scheduled for a device check from home on 08-23-2017. You may send your transmission at any time that day. If you have a wireless device, the transmission will be sent automatically. After your physician reviews your transmission, you will receive a postcard with your next transmission date.  If you need a refill on your cardiac medications before your next appointment, please call your pharmacy.

## 2017-05-28 ENCOUNTER — Ambulatory Visit: Payer: Medicare Other | Admitting: Family Medicine

## 2017-06-17 ENCOUNTER — Ambulatory Visit (INDEPENDENT_AMBULATORY_CARE_PROVIDER_SITE_OTHER): Payer: Medicare Other | Admitting: Family Medicine

## 2017-06-17 ENCOUNTER — Encounter: Payer: Self-pay | Admitting: Family Medicine

## 2017-06-17 VITALS — BP 108/68 | HR 79 | Temp 98.2°F | Ht 70.0 in | Wt 237.6 lb

## 2017-06-17 DIAGNOSIS — E785 Hyperlipidemia, unspecified: Secondary | ICD-10-CM | POA: Diagnosis not present

## 2017-06-17 DIAGNOSIS — F324 Major depressive disorder, single episode, in partial remission: Secondary | ICD-10-CM | POA: Diagnosis not present

## 2017-06-17 DIAGNOSIS — I10 Essential (primary) hypertension: Secondary | ICD-10-CM | POA: Diagnosis not present

## 2017-06-17 MED ORDER — OXYBUTYNIN CHLORIDE 5 MG PO TABS: 5.0000 mg | ORAL_TABLET | Freq: Three times a day (TID) | ORAL | 2 refills | Status: DC

## 2017-06-17 MED ORDER — ATORVASTATIN CALCIUM 20 MG PO TABS: 20.0000 mg | ORAL_TABLET | Freq: Every day | ORAL | 3 refills | Status: DC

## 2017-06-17 MED ORDER — LEVOTHYROXINE SODIUM 50 MCG PO TABS: ORAL_TABLET | ORAL | 1 refills | Status: DC

## 2017-06-17 MED ORDER — SIMVASTATIN 40 MG PO TABS: ORAL_TABLET | ORAL | 1 refills | Status: DC

## 2017-06-17 NOTE — Progress Notes (Signed)
Subjective:  Hunter [name] is a 83 y.o. year old very pleasant male patient who presents for/with See problem oriented charting ROS- difficulty hearing today. No chest pain. Edema better with even just light compression stockings  8-15 mmhg. Denies SI  Past Medical History-  Patient Active Problem List   Diagnosis Date Noted  . Complete heart block ([place]) 02/08/2017    Priority: High  . Benign hypertrophy of prostate 05/10/2015    Priority: High  . Shortness of breath 12/06/2013    Priority: High  . 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 Edema 08/15/2008    Priority: High  . Thrombocytopenia ([place]) 05/23/2012    Priority: Medium  . Neuropathy ([place]) 11/02/2011    Priority: Medium  . CKD (chronic kidney disease), stage III ([place]) 05/27/2011    Priority: Medium  . Memory loss 04/08/2011    Priority: Medium  . Hypothyroidism 08/15/2008    Priority: Medium  . ANEMIA 08/15/2008    Priority: Medium  . Hyperlipidemia 02/02/2007    Priority: Medium  . Major depression in partial remission ([place]) 02/02/2007    Priority: Medium  . Essential hypertension 02/02/2007    Priority: Medium  . Skin lesion of left lower extremity 03/26/2014    Priority: Low  . Stasis eczema 03/26/2014    Priority: Low  . History of skin cancer 03/26/2014    Priority: Low  . Syncope 08/14/2013    Priority: Low  . Low back pain 04/08/2011    Priority: Low  . LBBB (left bundle branch block) 05/12/2010    Priority: Low  . GAIT IMBALANCE 04/23/2010    Priority: Low  . SPINAL STENOSIS, CERVICAL 03/21/2010    Priority: Low  . Constipation 03/19/2010    Priority: Low  . Urinary frequency 09/05/2009    Priority: Low  . Vitamin D deficiency 08/15/2008    Priority: Low  . Allergic rhinitis 02/02/2007    Priority: Low  . GERD 02/02/2007    Priority: Low    Medications- reviewed and updated Current Outpatient Medications  Medication Sig Dispense Refill  .  acyclovir ointment (ZOVIRAX) 5 % Apply 1 application topically every 3 (three) hours. 5x a day For 5 days for genital herpes 30 g 1  . amLODipine (NORVASC) 5 MG tablet Take 1 tablet (5 mg total) by mouth daily. 30 tablet 6  . aspirin 81 MG tablet Take 81 mg by mouth daily.     . Cyanocobalamin (B-12 PO) Take 1 tablet by mouth daily.    Marland Kitchen levothyroxine (SYNTHROID, LEVOTHROID) 50 MCG tablet TAKE ONE TABLET BY MOUTH ONCE DAILY BEFORE BREAKFAST 90 tablet 1  . losartan (COZAAR) 50 MG tablet TAKE ONE TABLET BY MOUTH ONCE DAILY (Patient taking differently: Take 50 mg by mouth daily. ) 90 tablet 1  . naproxen sodium (ANAPROX) 220 MG tablet Take 220 mg by mouth daily as needed (for pain or headache).     Marland Kitchen oxybutynin (DITROPAN) 5 MG tablet Take 1 tablet (5 mg total) by mouth 3 (three) times daily. 90 tablet 2  . ranitidine (ZANTAC) 150 MG tablet Take 1 tablet (150 mg total) by mouth 2 (two) times daily as needed for heartburn. 180 tablet 1  . sertraline (ZOLOFT) 100 MG tablet Take 2 tablets (200 mg total) by mouth daily. 180 tablet 0  . triamterene-hydrochlorothiazide (MAXZIDE-25) 37.5-25 MG tablet Take 1 tablet by mouth daily. 30 tablet 3  . atorvastatin (LIPITOR) 20 MG  tablet Take 1 tablet (20 mg total) by mouth daily. 90 tablet 3   No current facility-administered medications for this visit.     Objective: BP 108/68 (BP Location: Left Arm, Patient Position: Sitting, Cuff Size: Large)   Pulse 79   Temp 98.2 F (36.8 C) (Oral)   Ht 5\' 10"  (1.778 m)   Wt 237 lb 9.6 oz (107.8 kg)   SpO2 98%   BMI 34.09 kg/m  Gen: NAD, resting comfortably CV: RRR no murmurs rubs or gallops Lungs: CTAB no crackles, wheeze, rhonchi Abdomen: soft/nontender/nondistended/normal bowel sounds.obese Ext: 1+ edema under low grade compression stockings Skin: warm, dry Neuro: very hard of hearing  Assessment/Plan  Essential hypertension S: controlled on losartan 50mg , triamterene-hctz 37.5-25mg , amlodipine 5mg  BP  Readings from Last 3 Encounters:  06/17/17 108/68  05/21/17 120/74  02/16/17 136/68  A/P: We discussed blood pressure goal of <140/90. Continue current meds:  Amlodipine not ideal with edema but really has helped BP- if BP remains this low though would reduce this first- this is the most recent addition and thus had to change simvastatatin   Hyperlipidemia S: poorly controlled on last check on simvastatin 40mg . No myalgias.  Lab Results  Component Value Date   CHOL 140 09/25/2013   HDL 34.20 (L) 09/25/2013   LDLCALC 52 09/25/2013   LDLDIRECT 128.3 05/17/2012   TRIG 267.0 (H) 09/25/2013   CHOLHDL 4 09/25/2013   A/P: change to atorvastatin 20mg  due to amlodipine start- at future visit we need to update his lipids- perhaps have him come in fasting sometime after visit.    Major depression in partial remission ([place]) S: PHQ9 of 5 today. No SI. Zoloft 200mg  plus amitriptyline- the amitriptyline elps with his sleep A/P: continue current medicines- have made him aware of serotonin syndrome risks in past- if has his hearing aid may update him again on this in future  Sent meds to mail order to help with patient cost  Future Appointments  Date Time Provider [place]  05/13/2018 11:30 AM Allred, Jeneen Rinks, MD [facility]   Meds ordered this encounter  Medications  . levothyroxine (SYNTHROID, LEVOTHROID) 50 MCG tablet    Sig: TAKE ONE TABLET BY MOUTH ONCE DAILY BEFORE BREAKFAST    Dispense:  90 tablet    Refill:  1  . oxybutynin (DITROPAN) 5 MG tablet    Sig: Take 1 tablet (5 mg total) by mouth 3 (three) times daily.    Dispense:  90 tablet    Refill:  2  . DISCONTD: simvastatin (ZOCOR) 40 MG tablet    Sig: TAKE ONE TABLET BY MOUTH ONCE DAILY AT  6PM    Dispense:  90 tablet    Refill:  1  . atorvastatin (LIPITOR) 20 MG tablet    Sig: Take 1 tablet (20 mg total) by mouth daily.    Dispense:  90 tablet    Refill:  3    Return precautions advised.  Garret Reddish,  MD

## 2017-06-17 NOTE — Assessment & Plan Note (Signed)
S: poorly controlled on last check on simvastatin 40mg . No myalgias.  Lab Results  Component Value Date   CHOL 140 09/25/2013   HDL 34.20 (L) 09/25/2013   LDLCALC 52 09/25/2013   LDLDIRECT 128.3 05/17/2012   TRIG 267.0 (H) 09/25/2013   CHOLHDL 4 09/25/2013   A/P: change to atorvastatin 20mg  due to amlodipine start- at future visit we need to update his lipids- perhaps have him come in fasting sometime after visit.

## 2017-06-17 NOTE — Patient Instructions (Signed)
4 month written in for follow up as well as reminder to change to atorvastatin

## 2017-06-17 NOTE — Assessment & Plan Note (Signed)
S: PHQ9 of 5 today. No SI. Zoloft 200mg  plus amitriptyline- the amitriptyline elps with his sleep A/P: continue current medicines- have made him aware of serotonin syndrome risks in past- if has his hearing aid may update him again on this in future

## 2017-06-17 NOTE — Assessment & Plan Note (Signed)
S: controlled on losartan 50mg , triamterene-hctz 37.5-25mg , amlodipine 5mg  BP Readings from Last 3 Encounters:  06/17/17 108/68  05/21/17 120/74  02/16/17 136/68  A/P: We discussed blood pressure goal of <140/90. Continue current meds:  Amlodipine not ideal with edema but really has helped BP- if BP remains this low though would reduce this first- this is the most recent addition and thus had to change simvastatatin

## 2017-07-07 LAB — CUP PACEART INCLINIC DEVICE CHECK
Date Time Interrogation Session: 20181226131924
Implantable Lead Implant Date: 20180730
Implantable Lead Implant Date: 20180730
Implantable Lead Location: 753859
Implantable Lead Location: 753860
Implantable Lead Model: 5076
Implantable Lead Model: 5076
Implantable Pulse Generator Implant Date: 20180730
Pulse Gen Model: 2272
Pulse Gen Serial Number: 8930553

## 2017-08-11 ENCOUNTER — Other Ambulatory Visit: Payer: Self-pay | Admitting: Family Medicine

## 2017-08-11 ENCOUNTER — Telehealth: Payer: Self-pay | Admitting: Family Medicine

## 2017-08-11 NOTE — Telephone Encounter (Signed)
Copied from [place]. Topic: Quick Communication - See Telephone Encounter >> Aug 11, 2017  4:54 PM Conception Chancy, NT wrote: CRM for notification. See Telephone encounter for:  08/11/17.  Junie Panning is calling from [employer] and states the Levothyroxine therapeutic drug and she needs the ok to Human resources officer.  CB# [phone]  Fax # [phone]

## 2017-08-12 NOTE — Telephone Encounter (Signed)
Called pharmacy and let them know to go ahead and fill his prescription with the brand they have in stock. Please advise time frame  for follow up TSH

## 2017-08-12 NOTE — Telephone Encounter (Signed)
Perfect- 6 week repeat TSH please

## 2017-08-16 ENCOUNTER — Other Ambulatory Visit: Payer: Self-pay

## 2017-08-16 DIAGNOSIS — E039 Hypothyroidism, unspecified: Secondary | ICD-10-CM

## 2017-08-16 NOTE — Telephone Encounter (Signed)
Called patient and left a voicemail message asking patient to call office and schedule a lab appointment in 6 weeks. I am entering the order for future TSH

## 2017-08-23 ENCOUNTER — Ambulatory Visit (INDEPENDENT_AMBULATORY_CARE_PROVIDER_SITE_OTHER): Payer: Medicare Other | Admitting: *Deleted

## 2017-08-23 ENCOUNTER — Telehealth: Payer: Self-pay | Admitting: Cardiology

## 2017-08-23 DIAGNOSIS — I442 Atrioventricular block, complete: Secondary | ICD-10-CM | POA: Diagnosis not present

## 2017-08-23 NOTE — Telephone Encounter (Signed)
Spoke with pt and reminded pt of remote transmission that is due today. Pt verbalized understanding.   

## 2017-08-23 NOTE — Progress Notes (Signed)
Remote pacemaker transmission.   

## 2017-08-25 ENCOUNTER — Encounter: Payer: Self-pay | Admitting: Cardiology

## 2017-08-31 LAB — CUP PACEART REMOTE DEVICE CHECK
Battery Remaining Longevity: 107 mo
Battery Remaining Percentage: 95.5 %
Battery Voltage: 2.99 V
Brady Statistic AP VP Percent: 33 %
Brady Statistic AP VS Percent: 1 %
Brady Statistic AS VP Percent: 67 %
Brady Statistic AS VS Percent: 1 %
Brady Statistic RA Percent Paced: 33 %
Brady Statistic RV Percent Paced: 99 %
Date Time Interrogation Session: 20190211192649
Implantable Lead Implant Date: 20180730
Implantable Lead Implant Date: 20180730
Implantable Lead Location: 753859
Implantable Lead Location: 753860
Implantable Lead Model: 5076
Implantable Lead Model: 5076
Implantable Pulse Generator Implant Date: 20180730
Lead Channel Impedance Value: 450 Ohm
Lead Channel Impedance Value: 660 Ohm
Lead Channel Pacing Threshold Amplitude: 0.5 V
Lead Channel Pacing Threshold Amplitude: 0.5 V
Lead Channel Pacing Threshold Pulse Width: 0.3 ms
Lead Channel Pacing Threshold Pulse Width: 0.5 ms
Lead Channel Sensing Intrinsic Amplitude: 1.9 mV
Lead Channel Sensing Intrinsic Amplitude: 12 mV
Lead Channel Setting Pacing Amplitude: 2 V
Lead Channel Setting Pacing Amplitude: 2.5 V
Lead Channel Setting Pacing Pulse Width: 0.5 ms
Lead Channel Setting Sensing Sensitivity: 4 mV
Pulse Gen Model: 2272
Pulse Gen Serial Number: 8930553

## 2017-10-01 DIAGNOSIS — H2513 Age-related nuclear cataract, bilateral: Secondary | ICD-10-CM | POA: Diagnosis not present

## 2017-10-01 DIAGNOSIS — H5203 Hypermetropia, bilateral: Secondary | ICD-10-CM | POA: Diagnosis not present

## 2017-10-21 ENCOUNTER — Encounter (HOSPITAL_COMMUNITY): Payer: Self-pay | Admitting: *Deleted

## 2017-10-21 ENCOUNTER — Inpatient Hospital Stay
Admission: AD | Admit: 2017-10-21 | Payer: Self-pay | Source: Other Acute Inpatient Hospital | Admitting: Family Medicine

## 2017-10-21 DIAGNOSIS — R262 Difficulty in walking, not elsewhere classified: Secondary | ICD-10-CM | POA: Diagnosis not present

## 2017-10-21 DIAGNOSIS — Z7401 Bed confinement status: Secondary | ICD-10-CM | POA: Diagnosis not present

## 2017-10-21 DIAGNOSIS — J90 Pleural effusion, not elsewhere classified: Secondary | ICD-10-CM | POA: Diagnosis not present

## 2017-10-21 DIAGNOSIS — T148XXA Other injury of unspecified body region, initial encounter: Secondary | ICD-10-CM | POA: Diagnosis not present

## 2017-10-21 DIAGNOSIS — Y998 Other external cause status: Secondary | ICD-10-CM | POA: Diagnosis not present

## 2017-10-21 DIAGNOSIS — Z79899 Other long term (current) drug therapy: Secondary | ICD-10-CM | POA: Diagnosis not present

## 2017-10-21 DIAGNOSIS — I272 Pulmonary hypertension, unspecified: Secondary | ICD-10-CM | POA: Diagnosis not present

## 2017-10-21 DIAGNOSIS — H9190 Unspecified hearing loss, unspecified ear: Secondary | ICD-10-CM | POA: Diagnosis not present

## 2017-10-21 DIAGNOSIS — I493 Ventricular premature depolarization: Secondary | ICD-10-CM | POA: Diagnosis not present

## 2017-10-21 DIAGNOSIS — N183 Chronic kidney disease, stage 3 (moderate): Secondary | ICD-10-CM | POA: Diagnosis not present

## 2017-10-21 DIAGNOSIS — Z743 Need for continuous supervision: Secondary | ICD-10-CM | POA: Diagnosis not present

## 2017-10-21 DIAGNOSIS — S72012D Unspecified intracapsular fracture of left femur, subsequent encounter for closed fracture with routine healing: Secondary | ICD-10-CM | POA: Diagnosis not present

## 2017-10-21 DIAGNOSIS — R410 Disorientation, unspecified: Secondary | ICD-10-CM | POA: Diagnosis not present

## 2017-10-21 DIAGNOSIS — E876 Hypokalemia: Secondary | ICD-10-CM | POA: Diagnosis not present

## 2017-10-21 DIAGNOSIS — R1312 Dysphagia, oropharyngeal phase: Secondary | ICD-10-CM | POA: Diagnosis not present

## 2017-10-21 DIAGNOSIS — E786 Lipoprotein deficiency: Secondary | ICD-10-CM | POA: Diagnosis not present

## 2017-10-21 DIAGNOSIS — Z950 Presence of cardiac pacemaker: Secondary | ICD-10-CM | POA: Diagnosis not present

## 2017-10-21 DIAGNOSIS — J811 Chronic pulmonary edema: Secondary | ICD-10-CM | POA: Diagnosis not present

## 2017-10-21 DIAGNOSIS — Z7982 Long term (current) use of aspirin: Secondary | ICD-10-CM | POA: Diagnosis not present

## 2017-10-21 DIAGNOSIS — S72045A Nondisplaced fracture of base of neck of left femur, initial encounter for closed fracture: Secondary | ICD-10-CM | POA: Diagnosis not present

## 2017-10-21 DIAGNOSIS — D696 Thrombocytopenia, unspecified: Secondary | ICD-10-CM | POA: Diagnosis not present

## 2017-10-21 DIAGNOSIS — S7292XA Unspecified fracture of left femur, initial encounter for closed fracture: Secondary | ICD-10-CM | POA: Diagnosis not present

## 2017-10-21 DIAGNOSIS — N401 Enlarged prostate with lower urinary tract symptoms: Secondary | ICD-10-CM | POA: Diagnosis not present

## 2017-10-21 DIAGNOSIS — S72102A Unspecified trochanteric fracture of left femur, initial encounter for closed fracture: Secondary | ICD-10-CM | POA: Diagnosis not present

## 2017-10-21 DIAGNOSIS — E039 Hypothyroidism, unspecified: Secondary | ICD-10-CM | POA: Diagnosis not present

## 2017-10-21 DIAGNOSIS — E785 Hyperlipidemia, unspecified: Secondary | ICD-10-CM | POA: Diagnosis not present

## 2017-10-21 DIAGNOSIS — W1839XA Other fall on same level, initial encounter: Secondary | ICD-10-CM | POA: Diagnosis not present

## 2017-10-21 DIAGNOSIS — N289 Disorder of kidney and ureter, unspecified: Secondary | ICD-10-CM | POA: Diagnosis not present

## 2017-10-21 DIAGNOSIS — D62 Acute posthemorrhagic anemia: Secondary | ICD-10-CM | POA: Diagnosis not present

## 2017-10-21 DIAGNOSIS — M810 Age-related osteoporosis without current pathological fracture: Secondary | ICD-10-CM | POA: Diagnosis not present

## 2017-10-21 DIAGNOSIS — W19XXXA Unspecified fall, initial encounter: Secondary | ICD-10-CM | POA: Insufficient documentation

## 2017-10-21 DIAGNOSIS — I130 Hypertensive heart and chronic kidney disease with heart failure and stage 1 through stage 4 chronic kidney disease, or unspecified chronic kidney disease: Secondary | ICD-10-CM | POA: Diagnosis not present

## 2017-10-21 DIAGNOSIS — M25552 Pain in left hip: Secondary | ICD-10-CM | POA: Diagnosis not present

## 2017-10-21 DIAGNOSIS — Z01818 Encounter for other preprocedural examination: Secondary | ICD-10-CM | POA: Diagnosis not present

## 2017-10-21 DIAGNOSIS — I5031 Acute diastolic (congestive) heart failure: Secondary | ICD-10-CM | POA: Diagnosis not present

## 2017-10-21 DIAGNOSIS — G4700 Insomnia, unspecified: Secondary | ICD-10-CM | POA: Diagnosis not present

## 2017-10-21 DIAGNOSIS — N400 Enlarged prostate without lower urinary tract symptoms: Secondary | ICD-10-CM | POA: Diagnosis not present

## 2017-10-21 DIAGNOSIS — S50312A Abrasion of left elbow, initial encounter: Secondary | ICD-10-CM | POA: Diagnosis not present

## 2017-10-21 DIAGNOSIS — I10 Essential (primary) hypertension: Secondary | ICD-10-CM | POA: Diagnosis not present

## 2017-10-21 DIAGNOSIS — S72145C Nondisplaced intertrochanteric fracture of left femur, initial encounter for open fracture type IIIA, IIIB, or IIIC: Secondary | ICD-10-CM | POA: Diagnosis not present

## 2017-10-21 DIAGNOSIS — W19XXXD Unspecified fall, subsequent encounter: Secondary | ICD-10-CM | POA: Diagnosis not present

## 2017-10-21 DIAGNOSIS — M6281 Muscle weakness (generalized): Secondary | ICD-10-CM | POA: Diagnosis not present

## 2017-10-21 DIAGNOSIS — S72002A Fracture of unspecified part of neck of left femur, initial encounter for closed fracture: Secondary | ICD-10-CM | POA: Insufficient documentation

## 2017-10-21 DIAGNOSIS — R279 Unspecified lack of coordination: Secondary | ICD-10-CM | POA: Diagnosis not present

## 2017-10-21 DIAGNOSIS — I071 Rheumatic tricuspid insufficiency: Secondary | ICD-10-CM | POA: Diagnosis not present

## 2017-10-21 DIAGNOSIS — S72145A Nondisplaced intertrochanteric fracture of left femur, initial encounter for closed fracture: Secondary | ICD-10-CM | POA: Diagnosis not present

## 2017-10-21 DIAGNOSIS — W010XXA Fall on same level from slipping, tripping and stumbling without subsequent striking against object, initial encounter: Secondary | ICD-10-CM | POA: Diagnosis not present

## 2017-10-21 DIAGNOSIS — R2681 Unsteadiness on feet: Secondary | ICD-10-CM | POA: Diagnosis not present

## 2017-10-21 DIAGNOSIS — M25462 Effusion, left knee: Secondary | ICD-10-CM | POA: Diagnosis not present

## 2017-10-21 DIAGNOSIS — N179 Acute kidney failure, unspecified: Secondary | ICD-10-CM | POA: Diagnosis not present

## 2017-10-21 DIAGNOSIS — S299XXA Unspecified injury of thorax, initial encounter: Secondary | ICD-10-CM | POA: Diagnosis not present

## 2017-10-21 NOTE — Progress Notes (Signed)
Unable to reach pt by phone. Left pre-op instructions on pt's voicemail

## 2017-10-22 ENCOUNTER — Telehealth: Payer: Self-pay | Admitting: *Deleted

## 2017-10-22 ENCOUNTER — Encounter (HOSPITAL_COMMUNITY): Payer: Self-pay | Admitting: Anesthesiology

## 2017-10-22 ENCOUNTER — Inpatient Hospital Stay (HOSPITAL_COMMUNITY): Admission: RE | Admit: 2017-10-22 | Payer: Medicare Other | Source: Ambulatory Visit | Admitting: Orthopedic Surgery

## 2017-10-22 DIAGNOSIS — Z950 Presence of cardiac pacemaker: Secondary | ICD-10-CM | POA: Insufficient documentation

## 2017-10-22 SURGERY — FIXATION, FRACTURE, INTERTROCHANTERIC, WITH INTRAMEDULLARY ROD
Anesthesia: General | Laterality: Left

## 2017-10-22 NOTE — Telephone Encounter (Signed)
ORIF of hip upcoming, patient poor historian. Calling to get device management recommendations. CHB, dependent, DDDR, apply magnet for asynchronous pacing, no post op interrogation needed. He is appreciative of help.

## 2017-10-24 DIAGNOSIS — J811 Chronic pulmonary edema: Secondary | ICD-10-CM | POA: Insufficient documentation

## 2017-10-24 DIAGNOSIS — E876 Hypokalemia: Secondary | ICD-10-CM | POA: Insufficient documentation

## 2017-10-24 DIAGNOSIS — R410 Disorientation, unspecified: Secondary | ICD-10-CM | POA: Insufficient documentation

## 2017-10-26 DIAGNOSIS — D62 Acute posthemorrhagic anemia: Secondary | ICD-10-CM | POA: Insufficient documentation

## 2017-10-27 DIAGNOSIS — S72143D Displaced intertrochanteric fracture of unspecified femur, subsequent encounter for closed fracture with routine healing: Secondary | ICD-10-CM | POA: Diagnosis not present

## 2017-10-27 DIAGNOSIS — D62 Acute posthemorrhagic anemia: Secondary | ICD-10-CM | POA: Diagnosis not present

## 2017-10-27 DIAGNOSIS — R1312 Dysphagia, oropharyngeal phase: Secondary | ICD-10-CM | POA: Diagnosis not present

## 2017-10-27 DIAGNOSIS — R2681 Unsteadiness on feet: Secondary | ICD-10-CM | POA: Diagnosis not present

## 2017-10-27 DIAGNOSIS — I130 Hypertensive heart and chronic kidney disease with heart failure and stage 1 through stage 4 chronic kidney disease, or unspecified chronic kidney disease: Secondary | ICD-10-CM | POA: Diagnosis not present

## 2017-10-27 DIAGNOSIS — S72012D Unspecified intracapsular fracture of left femur, subsequent encounter for closed fracture with routine healing: Secondary | ICD-10-CM | POA: Diagnosis not present

## 2017-10-27 DIAGNOSIS — E786 Lipoprotein deficiency: Secondary | ICD-10-CM | POA: Diagnosis not present

## 2017-10-27 DIAGNOSIS — E039 Hypothyroidism, unspecified: Secondary | ICD-10-CM | POA: Diagnosis not present

## 2017-10-27 DIAGNOSIS — R262 Difficulty in walking, not elsewhere classified: Secondary | ICD-10-CM | POA: Diagnosis not present

## 2017-10-27 DIAGNOSIS — Z950 Presence of cardiac pacemaker: Secondary | ICD-10-CM | POA: Diagnosis not present

## 2017-10-27 DIAGNOSIS — I5031 Acute diastolic (congestive) heart failure: Secondary | ICD-10-CM | POA: Diagnosis not present

## 2017-10-27 DIAGNOSIS — D696 Thrombocytopenia, unspecified: Secondary | ICD-10-CM | POA: Diagnosis not present

## 2017-10-27 DIAGNOSIS — I10 Essential (primary) hypertension: Secondary | ICD-10-CM | POA: Diagnosis not present

## 2017-10-27 DIAGNOSIS — N401 Enlarged prostate with lower urinary tract symptoms: Secondary | ICD-10-CM | POA: Diagnosis not present

## 2017-10-27 DIAGNOSIS — Z743 Need for continuous supervision: Secondary | ICD-10-CM | POA: Diagnosis not present

## 2017-10-27 DIAGNOSIS — W19XXXD Unspecified fall, subsequent encounter: Secondary | ICD-10-CM | POA: Diagnosis not present

## 2017-10-27 DIAGNOSIS — M6281 Muscle weakness (generalized): Secondary | ICD-10-CM | POA: Diagnosis not present

## 2017-10-27 DIAGNOSIS — N183 Chronic kidney disease, stage 3 (moderate): Secondary | ICD-10-CM | POA: Diagnosis not present

## 2017-10-27 DIAGNOSIS — R279 Unspecified lack of coordination: Secondary | ICD-10-CM | POA: Diagnosis not present

## 2017-10-27 DIAGNOSIS — N179 Acute kidney failure, unspecified: Secondary | ICD-10-CM | POA: Diagnosis not present

## 2017-10-28 DIAGNOSIS — S72143D Displaced intertrochanteric fracture of unspecified femur, subsequent encounter for closed fracture with routine healing: Secondary | ICD-10-CM | POA: Diagnosis not present

## 2017-10-28 DIAGNOSIS — E039 Hypothyroidism, unspecified: Secondary | ICD-10-CM | POA: Diagnosis not present

## 2017-10-28 DIAGNOSIS — I5031 Acute diastolic (congestive) heart failure: Secondary | ICD-10-CM | POA: Diagnosis not present

## 2017-11-03 DIAGNOSIS — D696 Thrombocytopenia, unspecified: Secondary | ICD-10-CM | POA: Diagnosis not present

## 2017-11-03 DIAGNOSIS — E039 Hypothyroidism, unspecified: Secondary | ICD-10-CM | POA: Diagnosis not present

## 2017-11-03 DIAGNOSIS — N401 Enlarged prostate with lower urinary tract symptoms: Secondary | ICD-10-CM | POA: Diagnosis not present

## 2017-11-03 DIAGNOSIS — S72143D Displaced intertrochanteric fracture of unspecified femur, subsequent encounter for closed fracture with routine healing: Secondary | ICD-10-CM | POA: Diagnosis not present

## 2017-11-08 DIAGNOSIS — S72143D Displaced intertrochanteric fracture of unspecified femur, subsequent encounter for closed fracture with routine healing: Secondary | ICD-10-CM | POA: Diagnosis not present

## 2017-11-11 DIAGNOSIS — S72143D Displaced intertrochanteric fracture of unspecified femur, subsequent encounter for closed fracture with routine healing: Secondary | ICD-10-CM | POA: Diagnosis not present

## 2017-11-11 DIAGNOSIS — N401 Enlarged prostate with lower urinary tract symptoms: Secondary | ICD-10-CM | POA: Diagnosis not present

## 2017-11-11 DIAGNOSIS — E039 Hypothyroidism, unspecified: Secondary | ICD-10-CM | POA: Diagnosis not present

## 2017-11-11 DIAGNOSIS — D696 Thrombocytopenia, unspecified: Secondary | ICD-10-CM | POA: Diagnosis not present

## 2017-11-17 DIAGNOSIS — B379 Candidiasis, unspecified: Secondary | ICD-10-CM | POA: Diagnosis not present

## 2017-11-17 DIAGNOSIS — M25552 Pain in left hip: Secondary | ICD-10-CM | POA: Diagnosis not present

## 2017-11-17 DIAGNOSIS — R29898 Other symptoms and signs involving the musculoskeletal system: Secondary | ICD-10-CM | POA: Diagnosis not present

## 2017-11-17 DIAGNOSIS — B372 Candidiasis of skin and nail: Secondary | ICD-10-CM | POA: Diagnosis not present

## 2017-11-17 DIAGNOSIS — I10 Essential (primary) hypertension: Secondary | ICD-10-CM | POA: Diagnosis not present

## 2017-11-17 DIAGNOSIS — S72102D Unspecified trochanteric fracture of left femur, subsequent encounter for closed fracture with routine healing: Secondary | ICD-10-CM | POA: Diagnosis not present

## 2017-11-21 ENCOUNTER — Other Ambulatory Visit: Payer: Self-pay

## 2017-11-21 ENCOUNTER — Emergency Department (HOSPITAL_COMMUNITY): Payer: Medicare Other

## 2017-11-21 ENCOUNTER — Encounter (HOSPITAL_COMMUNITY): Payer: Self-pay

## 2017-11-21 ENCOUNTER — Emergency Department (HOSPITAL_COMMUNITY)
Admission: EM | Admit: 2017-11-21 | Discharge: 2017-11-22 | Disposition: A | Discharge status: Home / Self Care | Payer: Medicare Other | Attending: Emergency Medicine | Admitting: Emergency Medicine

## 2017-11-21 DIAGNOSIS — M25552 Pain in left hip: Secondary | ICD-10-CM | POA: Insufficient documentation

## 2017-11-21 DIAGNOSIS — R05 Cough: Secondary | ICD-10-CM

## 2017-11-21 DIAGNOSIS — Z79899 Other long term (current) drug therapy: Secondary | ICD-10-CM | POA: Insufficient documentation

## 2017-11-21 DIAGNOSIS — E039 Hypothyroidism, unspecified: Secondary | ICD-10-CM | POA: Insufficient documentation

## 2017-11-21 DIAGNOSIS — Z950 Presence of cardiac pacemaker: Secondary | ICD-10-CM | POA: Insufficient documentation

## 2017-11-21 DIAGNOSIS — N183 Chronic kidney disease, stage 3 (moderate): Secondary | ICD-10-CM | POA: Insufficient documentation

## 2017-11-21 DIAGNOSIS — I129 Hypertensive chronic kidney disease with stage 1 through stage 4 chronic kidney disease, or unspecified chronic kidney disease: Secondary | ICD-10-CM | POA: Diagnosis not present

## 2017-11-21 DIAGNOSIS — R404 Transient alteration of awareness: Secondary | ICD-10-CM | POA: Diagnosis not present

## 2017-11-21 DIAGNOSIS — R059 Cough, unspecified: Secondary | ICD-10-CM

## 2017-11-21 DIAGNOSIS — R531 Weakness: Secondary | ICD-10-CM

## 2017-11-21 LAB — CBC WITH DIFFERENTIAL/PLATELET
Basophils Absolute: 0 10*3/uL (ref 0.0–0.1)
Basophils Relative: 0 %
Eosinophils Absolute: 0.1 10*3/uL (ref 0.0–0.7)
Eosinophils Relative: 1 %
HCT: 36.5 % — ABNORMAL LOW (ref 39.0–52.0)
Hemoglobin: 11.9 g/dL — ABNORMAL LOW (ref 13.0–17.0)
Lymphocytes Relative: 19 %
Lymphs Abs: 1.4 10*3/uL (ref 0.7–4.0)
MCH: 25.1 pg — ABNORMAL LOW (ref 26.0–34.0)
MCHC: 32.6 g/dL (ref 30.0–36.0)
MCV: 77 fL — ABNORMAL LOW (ref 78.0–100.0)
Monocytes Absolute: 0.6 10*3/uL (ref 0.1–1.0)
Monocytes Relative: 8 %
Neutro Abs: 5.1 10*3/uL (ref 1.7–7.7)
Neutrophils Relative %: 72 %
Platelets: 141 10*3/uL — ABNORMAL LOW (ref 150–400)
RBC: 4.74 MIL/uL (ref 4.22–5.81)
RDW: 13.9 % (ref 11.5–15.5)
WBC: 7.1 10*3/uL (ref 4.0–10.5)

## 2017-11-21 LAB — COMPREHENSIVE METABOLIC PANEL
ALT: 18 U/L (ref 17–63)
AST: 26 U/L (ref 15–41)
Albumin: 3.6 g/dL (ref 3.5–5.0)
Alkaline Phosphatase: 144 U/L — ABNORMAL HIGH (ref 38–126)
Anion gap: 10 (ref 5–15)
BUN: 15 mg/dL (ref 6–20)
CO2: 25 mmol/L (ref 22–32)
Calcium: 9.1 mg/dL (ref 8.9–10.3)
Chloride: 104 mmol/L (ref 101–111)
Creatinine, Ser: 1.06 mg/dL (ref 0.61–1.24)
GFR calc Af Amer: >60 mL/min (ref >60)
GFR calc non Af Amer: >60 mL/min (ref >60)
Glucose, Bld: 98 mg/dL (ref 65–99)
Potassium: 3.5 mmol/L (ref 3.5–5.1)
Sodium: 139 mmol/L (ref 135–145)
Total Bilirubin: 0.8 mg/dL (ref 0.3–1.2)
Total Protein: 6 g/dL — ABNORMAL LOW (ref 6.5–8.1)

## 2017-11-21 LAB — CK: Total CK: 255 U/L (ref 49–397)

## 2017-11-21 LAB — RAPID URINE DRUG SCREEN, HOSP PERFORMED
Amphetamines: NOT DETECTED
Barbiturates: NOT DETECTED
Benzodiazepines: NOT DETECTED
Cocaine: NOT DETECTED
Opiates: NOT DETECTED
Tetrahydrocannabinol: NOT DETECTED

## 2017-11-21 LAB — URINALYSIS, ROUTINE W REFLEX MICROSCOPIC
Bilirubin Urine: NEGATIVE
Glucose, UA: NEGATIVE mg/dL
Hgb urine dipstick: NEGATIVE
Ketones, ur: 5 mg/dL — AB
Leukocytes, UA: NEGATIVE
Nitrite: NEGATIVE
Protein, ur: NEGATIVE mg/dL
Specific Gravity, Urine: 1.008 (ref 1.005–1.030)
pH: 6 (ref 5.0–8.0)

## 2017-11-21 MED ORDER — SODIUM CHLORIDE 0.9 % IV BOLUS
500.0000 mL | Freq: Once | INTRAVENOUS | Status: AC
  Administered 2017-11-21: 1000 mL via INTRAVENOUS

## 2017-11-21 NOTE — ED Triage Notes (Signed)
Pt reports had left hip surgery and was in [facility] for rehab.  Pt was dishcarged from nursing facility 2 days ago.  Pt says has been getting weaker and weaker and now unable to walk.  Left leg appears shorter than r leg.  Left foot and leg swollen.  EMS says pt wants to go to another nursing home for more rehab but doesn't want to go back to [facility].

## 2017-11-21 NOTE — Discharge Instructions (Addendum)
Call your orthopedist for follow-up tomorrow. A home health care referral has been made and someone should be contacting you soon to assist with your needs.

## 2017-11-21 NOTE — ED Provider Notes (Addendum)
[facility] EMERGENCY DEPARTMENT Provider Note   CSN: [id number] Arrival date & time: 11/21/17  1611     History   Chief Complaint Chief Complaint  Patient presents with  . Hip Pain    HPI Hunter [name] is a 84 y.o. male.  HPI 84 year old man status post intramedullary left hip repair approximately 2 weeks ago at [facility].  He was discharged to a nursing home but was unhappy with his care there.  He returned home on Friday.  He was using a wheelchair and walker.  He states that yesterday evening he became too weak to transfer to the wheelchair and had to crawl to the door to let his dogs out.  He remained on the ground during the night and called EMS for transport today.  He continues to complain of left hip pain.  He did denies any chest pain, cough, or fever.  He states he has had some chills.  Denies any new injury and continues to complain of some pain in his left hip. Past Medical History:  Diagnosis Date  . Anemia   . Anxiety   . Arthritis    "knees" (02/08/2017)  . Chronic lower back pain   . Complete heart block ([place]) 02/08/2017  . Depression   . Eczema   . GERD (gastroesophageal reflux disease) occasional  . Hearing loss of both ears   . Hx of echocardiogram    Echo (07/2013): Mild LVH, EF 78-67%, grade 1 diastolic dysfunction, mild LAE, PASP 23  . Hyperlipidemia   . Hypertension   . Hypothyroidism   . LBBB (left bundle branch block)   . Left patella fracture 2018   "no OR" (02/08/2017)  . Nocturia   . Peripheral vascular disease (HCC) LOWER EXTREMITIES  . Squamous cell skin cancer, penis: glans ([place]) 05/2010   Initial excision 11/11; recurrence, excision and laser Rx 9/13    Patient Active Problem List   Diagnosis Date Noted  . Complete heart block ([place]) 02/08/2017  . Benign hypertrophy of prostate 05/10/2015  . Skin lesion of left lower extremity 03/26/2014  . Stasis eczema 03/26/2014  . History of skin cancer 03/26/2014  . Shortness of breath  12/06/2013  . Syncope 08/14/2013  . 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 Thrombocytopenia ([place]) 05/23/2012  . Neuropathy ([place]) 11/02/2011  . CKD (chronic kidney disease), stage III ([place]) 05/27/2011  . Memory loss 04/08/2011  . Low back pain 04/08/2011  . LBBB (left bundle branch block) 05/12/2010  . GAIT IMBALANCE 04/23/2010  . SPINAL STENOSIS, CERVICAL 03/21/2010  . Constipation 03/19/2010  . Urinary frequency 09/05/2009  . Hypothyroidism 08/15/2008  . Vitamin D deficiency 08/15/2008  . ANEMIA 08/15/2008  . Edema 08/15/2008  . Hyperlipidemia 02/02/2007  . Major depression in partial remission ([place]) 02/02/2007  . Essential hypertension 02/02/2007  . Allergic rhinitis 02/02/2007  . GERD 02/02/2007    Past Surgical History:  Procedure Laterality Date  . CIRCUMCISION/ LASER DISSECTION PENILE GLANS CANCER  06-02-2010  . CYSTOSCOPY WITH URETHRAL DILATATION  03/28/2012   Procedure: CYSTOSCOPY WITH URETHRAL DILATATION;  Surgeon: Ailene Rud, MD;  Location: [facility];  Service: Urology;  Laterality: N/A;  excision biopsy extensive meatal penile carcinoma meatoplasty  . EXCISION RIGHT WRIST BENIGN TUMOR  2002 (APPROX)  . hip surgery    . INCISION AND DRAINAGE DEEP NECK ABSCESS    . INGUINAL HERNIA REPAIR  1970's  . KNEE ARTHROSCOPY Right 2017  . PACEMAKER  IMPLANT N/A 02/08/2017   Procedure: Pacemaker Implant;  Surgeon: Deboraha Sprang, MD;  Location: [facility];  Service: Cardiovascular;  Laterality: N/A;  . PACEMAKER IMPLANT  02/08/2017   SJM Assurity MRI dual chamber PPM implanted by Dr Caryl Comes for complete heart block  . PENILE BX  05-09-2010  . TONSILLECTOMY    . TRANSURETHRAL RESECTION OF PROSTATE N/A 05/10/2015   Procedure: CYSTOSCOPY, URETHRAL MEATAL DILATION, TRANSURETHRAL RESECTION OF THE PROSTATE (TURP);  Surgeon: Carolan Clines, MD;  Location: [facility];  Service: Urology;  Laterality: N/A;        Home  Medications    Prior to Admission medications   Medication Sig Start Date End Date Taking? Authorizing Provider  acyclovir ointment (ZOVIRAX) 5 % Apply 1 application topically every 3 (three) hours. 5x a day For 5 days for genital herpes 01/05/17   [name], MD  amLODipine (NORVASC) 5 MG tablet Take 1 tablet (5 mg total) by mouth daily. 02/10/17 02/10/18  Baldwin Jamaica, PA-C  aspirin 81 MG tablet Take 81 mg by mouth daily.     [provider]  atorvastatin (LIPITOR) 20 MG tablet Take 1 tablet (20 mg total) by mouth daily. 06/17/17   [name], MD  Cyanocobalamin (B-12 PO) Take 1 tablet by mouth daily.    [provider]  levothyroxine (SYNTHROID, LEVOTHROID) 50 MCG tablet TAKE ONE TABLET BY MOUTH ONCE DAILY BEFORE BREAKFAST 06/17/17   [name], MD  losartan (COZAAR) 50 MG tablet TAKE ONE TABLET BY MOUTH ONCE DAILY 08/12/17   [name], MD  naproxen sodium (ANAPROX) 220 MG tablet Take 220 mg by mouth daily as needed (for pain or headache).     [provider]  oxybutynin (DITROPAN) 5 MG tablet Take 1 tablet (5 mg total) by mouth 3 (three) times daily. 06/17/17   [name], MD  ranitidine (ZANTAC) 150 MG tablet Take 1 tablet (150 mg total) by mouth 2 (two) times daily as needed for heartburn. 08/14/13   Shawna Orleans, Doe-Hyun R, DO  sertraline (ZOLOFT) 100 MG tablet TAKE 2 TABLETS BY MOUTH ONCE DAILY 08/12/17   [name], MD  triamterene-hydrochlorothiazide (MAXZIDE-25) 37.5-25 MG tablet Take 1 tablet by mouth daily. 02/10/17   Baldwin Jamaica, PA-C    Family History Family History  Problem Relation Age of Onset  . Lung cancer Mother   . Arthritis Unknown        family hx  . Hyperlipidemia Unknown        family hx  . Hypertension Unknown        family hx  . Prostate cancer Unknown        family hx    Social History Social History   Tobacco Use  . Smoking status: Never Smoker  . Smokeless tobacco: Never Used  Substance Use  Topics  . Alcohol use: Yes    Comment: 02/08/2017 "nothing in the 2000s"  . Drug use: No     Allergies   Patient has no known allergies.   Review of Systems Review of Systems   Physical Exam Updated Vital Signs BP (!) 147/62 (BP Location: Left Arm)   Pulse 73   Temp 98.2 F (36.8 C) (Rectal)   Resp 14   Ht 1.803 m (5\' 11" )   Wt 99.8 kg (220 lb)   SpO2 100%   BMI 30.68 kg/m   Physical Exam  Constitutional: He appears well-developed and well-nourished. No distress.  HENT:  Head: Normocephalic and atraumatic.  Right Ear: External ear normal.  Left Ear: External ear normal.  Mouth/Throat: Oropharynx is clear and moist.  Eyes: Pupils are equal, round, and reactive to light. EOM are normal.  Neck: Normal range of motion. Neck supple.  Cardiovascular: Normal rate and regular rhythm.  Pulmonary/Chest: Effort normal and breath sounds normal.  Abdominal: Soft.  Musculoskeletal: Normal range of motion.  Well healing hip incision without surrounding erythema, redness, or fluctuance Distal pulses, motor, and sensory intact  Nursing note and vitals reviewed.    ED Treatments / Results  Labs (all labs ordered are listed, but only abnormal results are displayed) Labs Reviewed - No data to display  EKG   ED ECG REPORT   Date: 11/21/2017  Rate: 75  Rhythm: dual chamber paced rhythm  QRS Axis: indeterminate  Intervals: no further interpretation  ST/T Wave abnormalities: no futher interpretation due to pacemaker  Conduction Disutrbances:no futher interpretation due to pacemaker  Narrative Interpretation:   Old EKG Reviewed: none available  I have personally reviewed the EKG tracing and agree with the computerized printout as noted.  Radiology No results found. cxr and hip reviewed without acute changes Procedures Procedures (including critical care time)  Medications Ordered in ED Medications - No data to display   Initial Impression / Assessment and Plan /  ED Course  I have reviewed the triage vital signs and the nursing notes.  Pertinent labs & imaging results that were available during my care of the patient were reviewed by me and considered in my medical decision making (see chart for details).     This is an 85 year old man with recent hip surgery.  Presents today after he unable to get himself into his wheelchair yesterday.  He stayed on the ground overnight and came in via EMS today.  He has been placed in a nursing home but had opted to leave.  Here his work-up is normal.  He was able to ambulate here with a walker.  Plan Home health care referral he is advised regarding follow-up and is discharged home in stable condition.  Final Clinical Impressions(s) / ED Diagnoses   Final diagnoses:  Weakness    ED Discharge Orders    None       Pattricia Boss, MD 11/21/17 2214    Pattricia Boss, MD 12/01/17 1850

## 2017-11-21 NOTE — ED Notes (Signed)
Walked pt with walker,pt did good,steady with walker

## 2017-11-22 ENCOUNTER — Telehealth: Payer: Self-pay | Admitting: Cardiology

## 2017-11-22 ENCOUNTER — Ambulatory Visit: Payer: Medicare Other | Admitting: *Deleted

## 2017-11-22 ENCOUNTER — Ambulatory Visit (INDEPENDENT_AMBULATORY_CARE_PROVIDER_SITE_OTHER): Payer: Medicare Other | Admitting: *Deleted

## 2017-11-22 DIAGNOSIS — R279 Unspecified lack of coordination: Secondary | ICD-10-CM | POA: Diagnosis not present

## 2017-11-22 DIAGNOSIS — I442 Atrioventricular block, complete: Secondary | ICD-10-CM

## 2017-11-22 DIAGNOSIS — Z7401 Bed confinement status: Secondary | ICD-10-CM | POA: Diagnosis not present

## 2017-11-22 NOTE — Telephone Encounter (Signed)
LMOVM reminding pt to send remote transmission.   

## 2017-11-23 NOTE — Progress Notes (Signed)
Remote pacemaker transmission.   

## 2017-11-24 ENCOUNTER — Inpatient Hospital Stay: Payer: Medicare Other | Admitting: Family Medicine

## 2017-11-25 ENCOUNTER — Encounter: Payer: Self-pay | Admitting: Cardiology

## 2017-11-26 DIAGNOSIS — S72012D Unspecified intracapsular fracture of left femur, subsequent encounter for closed fracture with routine healing: Secondary | ICD-10-CM | POA: Diagnosis not present

## 2017-12-07 ENCOUNTER — Encounter: Payer: Self-pay | Admitting: Cardiology

## 2017-12-07 LAB — CUP PACEART REMOTE DEVICE CHECK
Battery Remaining Longevity: 104 mo
Battery Remaining Percentage: 95.5 %
Battery Voltage: 2.99 V
Brady Statistic AP VP Percent: 29 %
Brady Statistic AP VS Percent: 1 %
Brady Statistic AS VP Percent: 70 %
Brady Statistic AS VS Percent: 1 %
Brady Statistic RA Percent Paced: 29 %
Brady Statistic RV Percent Paced: 99 %
Date Time Interrogation Session: 20190513104010
Implantable Lead Implant Date: 20180730
Implantable Lead Implant Date: 20180730
Implantable Lead Location: 753859
Implantable Lead Location: 753860
Implantable Lead Model: 5076
Implantable Lead Model: 5076
Implantable Pulse Generator Implant Date: 20180730
Lead Channel Impedance Value: 440 Ohm
Lead Channel Impedance Value: 690 Ohm
Lead Channel Pacing Threshold Amplitude: 0.5 V
Lead Channel Pacing Threshold Amplitude: 0.5 V
Lead Channel Pacing Threshold Pulse Width: 0.3 ms
Lead Channel Pacing Threshold Pulse Width: 0.5 ms
Lead Channel Sensing Intrinsic Amplitude: 1.6 mV
Lead Channel Sensing Intrinsic Amplitude: 12 mV
Lead Channel Setting Pacing Amplitude: 2 V
Lead Channel Setting Pacing Amplitude: 2.5 V
Lead Channel Setting Pacing Pulse Width: 0.5 ms
Lead Channel Setting Sensing Sensitivity: 4 mV
Pulse Gen Model: 2272
Pulse Gen Serial Number: 8930553

## 2017-12-07 NOTE — Progress Notes (Signed)
Remote pacemaker transmission.   

## 2017-12-15 DIAGNOSIS — R918 Other nonspecific abnormal finding of lung field: Secondary | ICD-10-CM | POA: Diagnosis not present

## 2017-12-15 DIAGNOSIS — R609 Edema, unspecified: Secondary | ICD-10-CM | POA: Diagnosis not present

## 2017-12-15 DIAGNOSIS — L03115 Cellulitis of right lower limb: Secondary | ICD-10-CM | POA: Diagnosis not present

## 2017-12-15 DIAGNOSIS — L03116 Cellulitis of left lower limb: Secondary | ICD-10-CM | POA: Diagnosis not present

## 2017-12-15 DIAGNOSIS — H9190 Unspecified hearing loss, unspecified ear: Secondary | ICD-10-CM | POA: Diagnosis not present

## 2017-12-15 DIAGNOSIS — Z79899 Other long term (current) drug therapy: Secondary | ICD-10-CM | POA: Diagnosis not present

## 2017-12-15 DIAGNOSIS — I2510 Atherosclerotic heart disease of native coronary artery without angina pectoris: Secondary | ICD-10-CM | POA: Diagnosis not present

## 2017-12-15 DIAGNOSIS — B9620 Unspecified Escherichia coli [E. coli] as the cause of diseases classified elsewhere: Secondary | ICD-10-CM | POA: Diagnosis not present

## 2017-12-15 DIAGNOSIS — I119 Hypertensive heart disease without heart failure: Secondary | ICD-10-CM | POA: Diagnosis not present

## 2017-12-15 DIAGNOSIS — E785 Hyperlipidemia, unspecified: Secondary | ICD-10-CM | POA: Diagnosis not present

## 2017-12-15 DIAGNOSIS — I700 Atherosclerosis of aorta: Secondary | ICD-10-CM | POA: Diagnosis not present

## 2017-12-15 DIAGNOSIS — R262 Difficulty in walking, not elsewhere classified: Secondary | ICD-10-CM | POA: Diagnosis not present

## 2017-12-15 DIAGNOSIS — N390 Urinary tract infection, site not specified: Secondary | ICD-10-CM | POA: Diagnosis not present

## 2017-12-15 DIAGNOSIS — R791 Abnormal coagulation profile: Secondary | ICD-10-CM | POA: Diagnosis not present

## 2017-12-15 DIAGNOSIS — I872 Venous insufficiency (chronic) (peripheral): Secondary | ICD-10-CM | POA: Diagnosis not present

## 2017-12-15 DIAGNOSIS — Z7982 Long term (current) use of aspirin: Secondary | ICD-10-CM | POA: Diagnosis not present

## 2017-12-15 DIAGNOSIS — Z9181 History of falling: Secondary | ICD-10-CM | POA: Diagnosis not present

## 2017-12-15 DIAGNOSIS — R2242 Localized swelling, mass and lump, left lower limb: Secondary | ICD-10-CM | POA: Diagnosis not present

## 2017-12-15 DIAGNOSIS — R52 Pain, unspecified: Secondary | ICD-10-CM | POA: Diagnosis not present

## 2017-12-15 DIAGNOSIS — M79606 Pain in leg, unspecified: Secondary | ICD-10-CM | POA: Diagnosis not present

## 2017-12-15 DIAGNOSIS — R600 Localized edema: Secondary | ICD-10-CM | POA: Diagnosis not present

## 2017-12-16 DIAGNOSIS — R791 Abnormal coagulation profile: Secondary | ICD-10-CM | POA: Diagnosis not present

## 2017-12-16 DIAGNOSIS — L03116 Cellulitis of left lower limb: Secondary | ICD-10-CM | POA: Diagnosis not present

## 2017-12-16 DIAGNOSIS — I872 Venous insufficiency (chronic) (peripheral): Secondary | ICD-10-CM | POA: Diagnosis not present

## 2017-12-16 DIAGNOSIS — L03115 Cellulitis of right lower limb: Secondary | ICD-10-CM | POA: Diagnosis not present

## 2017-12-16 DIAGNOSIS — N390 Urinary tract infection, site not specified: Secondary | ICD-10-CM | POA: Diagnosis not present

## 2017-12-16 DIAGNOSIS — H9190 Unspecified hearing loss, unspecified ear: Secondary | ICD-10-CM | POA: Diagnosis not present

## 2017-12-17 DIAGNOSIS — I10 Essential (primary) hypertension: Secondary | ICD-10-CM | POA: Diagnosis not present

## 2017-12-17 DIAGNOSIS — I872 Venous insufficiency (chronic) (peripheral): Secondary | ICD-10-CM | POA: Diagnosis not present

## 2017-12-17 DIAGNOSIS — L03116 Cellulitis of left lower limb: Secondary | ICD-10-CM | POA: Diagnosis not present

## 2017-12-17 DIAGNOSIS — L03115 Cellulitis of right lower limb: Secondary | ICD-10-CM | POA: Diagnosis not present

## 2017-12-17 DIAGNOSIS — E785 Hyperlipidemia, unspecified: Secondary | ICD-10-CM | POA: Diagnosis not present

## 2017-12-17 DIAGNOSIS — N390 Urinary tract infection, site not specified: Secondary | ICD-10-CM | POA: Diagnosis not present

## 2017-12-18 DIAGNOSIS — N390 Urinary tract infection, site not specified: Secondary | ICD-10-CM | POA: Diagnosis not present

## 2017-12-18 DIAGNOSIS — L03115 Cellulitis of right lower limb: Secondary | ICD-10-CM | POA: Diagnosis not present

## 2017-12-18 DIAGNOSIS — E785 Hyperlipidemia, unspecified: Secondary | ICD-10-CM | POA: Diagnosis not present

## 2017-12-18 DIAGNOSIS — I10 Essential (primary) hypertension: Secondary | ICD-10-CM | POA: Diagnosis not present

## 2017-12-18 DIAGNOSIS — L03116 Cellulitis of left lower limb: Secondary | ICD-10-CM | POA: Diagnosis not present

## 2017-12-24 DIAGNOSIS — M1712 Unilateral primary osteoarthritis, left knee: Secondary | ICD-10-CM | POA: Diagnosis not present

## 2017-12-24 DIAGNOSIS — S72002D Fracture of unspecified part of neck of left femur, subsequent encounter for closed fracture with routine healing: Secondary | ICD-10-CM | POA: Diagnosis not present

## 2017-12-31 ENCOUNTER — Other Ambulatory Visit: Payer: Self-pay | Admitting: Family Medicine

## 2018-01-06 ENCOUNTER — Ambulatory Visit: Payer: Medicare Other | Admitting: Family Medicine

## 2018-02-16 ENCOUNTER — Ambulatory Visit (INDEPENDENT_AMBULATORY_CARE_PROVIDER_SITE_OTHER): Payer: Medicare Other | Admitting: Family Medicine

## 2018-02-16 ENCOUNTER — Encounter: Payer: Self-pay | Admitting: Family Medicine

## 2018-02-16 VITALS — BP 122/62 | HR 68 | Temp 98.1°F | Ht 71.0 in | Wt 225.4 lb

## 2018-02-16 DIAGNOSIS — F324 Major depressive disorder, single episode, in partial remission: Secondary | ICD-10-CM

## 2018-02-16 DIAGNOSIS — E785 Hyperlipidemia, unspecified: Secondary | ICD-10-CM

## 2018-02-16 DIAGNOSIS — R350 Frequency of micturition: Secondary | ICD-10-CM | POA: Diagnosis not present

## 2018-02-16 DIAGNOSIS — D696 Thrombocytopenia, unspecified: Secondary | ICD-10-CM

## 2018-02-16 DIAGNOSIS — I10 Essential (primary) hypertension: Secondary | ICD-10-CM

## 2018-02-16 DIAGNOSIS — N183 Chronic kidney disease, stage 3 unspecified: Secondary | ICD-10-CM

## 2018-02-16 DIAGNOSIS — E039 Hypothyroidism, unspecified: Secondary | ICD-10-CM | POA: Diagnosis not present

## 2018-02-16 DIAGNOSIS — R269 Unspecified abnormalities of gait and mobility: Secondary | ICD-10-CM

## 2018-02-16 LAB — COMPREHENSIVE METABOLIC PANEL
ALT: 10 U/L (ref 0–53)
AST: 13 U/L (ref 0–37)
Albumin: 4.2 g/dL (ref 3.5–5.2)
Alkaline Phosphatase: 98 U/L (ref 39–117)
BUN: 16 mg/dL (ref 6–23)
CO2: 27 mEq/L (ref 19–32)
Calcium: 9.6 mg/dL (ref 8.4–10.5)
Chloride: 107 mEq/L (ref 96–112)
Creatinine, Ser: 1.21 mg/dL (ref 0.40–1.50)
GFR: 61.04 mL/min (ref >60.00)
Glucose, Bld: 105 mg/dL — ABNORMAL HIGH (ref 70–99)
Potassium: 4.1 mEq/L (ref 3.5–5.1)
Sodium: 141 mEq/L (ref 135–145)
Total Bilirubin: 0.5 mg/dL (ref 0.2–1.2)
Total Protein: 6.5 g/dL (ref 6.0–8.3)

## 2018-02-16 LAB — LIPID PANEL
Cholesterol: 179 mg/dL (ref 0–200)
HDL: 28.4 mg/dL — ABNORMAL LOW (ref >39.00)
LDL Cholesterol: 113 mg/dL — ABNORMAL HIGH (ref 0–99)
NonHDL: 151
Total CHOL/HDL Ratio: 6
Triglycerides: 188 mg/dL — ABNORMAL HIGH (ref 0.0–149.0)
VLDL: 37.6 mg/dL (ref 0.0–40.0)

## 2018-02-16 LAB — CBC
HCT: 38.2 % — ABNORMAL LOW (ref 39.0–52.0)
Hemoglobin: 12.6 g/dL — ABNORMAL LOW (ref 13.0–17.0)
MCHC: 33.1 g/dL (ref 30.0–36.0)
MCV: 71.1 fl — ABNORMAL LOW (ref 78.0–100.0)
Platelets: 156 10*3/uL (ref 150.0–400.0)
RBC: 5.37 Mil/uL (ref 4.22–5.81)
RDW: 17.3 % — ABNORMAL HIGH (ref 11.5–15.5)
WBC: 6.1 10*3/uL (ref 4.0–10.5)

## 2018-02-16 LAB — TSH: TSH: 3.34 u[IU]/mL (ref 0.35–4.50)

## 2018-02-16 NOTE — Progress Notes (Signed)
Subjective:  Hunter [name] is a 84 y.o. year old very pleasant male patient who presents for/with See problem oriented charting ROS- patient with difficulty walking due to stability issues. Edema is stable. Having some incontinence.    Past Medical History-  Patient Active Problem List   Diagnosis Date Noted  . Complete heart block ([place]) 02/08/2017    Priority: High  . Benign hypertrophy of prostate 05/10/2015    Priority: High  . Shortness of breath 12/06/2013    Priority: High  . 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 Edema 08/15/2008    Priority: High  . Thrombocytopenia ([place]) 05/23/2012    Priority: Medium  . Neuropathy ([place]) 11/02/2011    Priority: Medium  . CKD (chronic kidney disease), stage III ([place]) 05/27/2011    Priority: Medium  . Memory loss 04/08/2011    Priority: Medium  . Hypothyroidism 08/15/2008    Priority: Medium  . ANEMIA 08/15/2008    Priority: Medium  . Hyperlipidemia 02/02/2007    Priority: Medium  . Major depression in partial remission ([place]) 02/02/2007    Priority: Medium  . Essential hypertension 02/02/2007    Priority: Medium  . Skin lesion of left lower extremity 03/26/2014    Priority: Low  . Stasis eczema 03/26/2014    Priority: Low  . History of skin cancer 03/26/2014    Priority: Low  . Syncope 08/14/2013    Priority: Low  . Low back pain 04/08/2011    Priority: Low  . LBBB (left bundle branch block) 05/12/2010    Priority: Low  . GAIT IMBALANCE 04/23/2010    Priority: Low  . SPINAL STENOSIS, CERVICAL 03/21/2010    Priority: Low  . Constipation 03/19/2010    Priority: Low  . Urinary frequency 09/05/2009    Priority: Low  . Vitamin D deficiency 08/15/2008    Priority: Low  . Allergic rhinitis 02/02/2007    Priority: Low  . GERD 02/02/2007    Priority: Low    Medications- reviewed and updated Current Outpatient Medications  Medication Sig Dispense Refill  . amLODipine (NORVASC) 5  MG tablet TAKE 1 TABLET BY MOUTH EVERY DAY 30 tablet 6  . aspirin 81 MG tablet Take 81 mg by mouth daily.     Marland Kitchen atorvastatin (LIPITOR) 20 MG tablet Take 1 tablet (20 mg total) by mouth daily. 90 tablet 3  . Cyanocobalamin (B-12 PO) Take 1 tablet by mouth daily.    . furosemide (LASIX) 20 MG tablet TAKE 1 TABLET BY MOUTH EVERY DAY 90 tablet 1  . levothyroxine (SYNTHROID, LEVOTHROID) 75 MCG tablet TAKE 1 TABLET BY MOUTH EVERY MORNING 90 tablet 1  . losartan (COZAAR) 50 MG tablet TAKE 1 TABLET BY MOUTH EVERY DAY 90 tablet 1  . naproxen sodium (ANAPROX) 220 MG tablet Take 220 mg by mouth daily as needed (for pain or headache).     Marland Kitchen oxybutynin (DITROPAN) 5 MG tablet Take 1 tablet (5 mg total) by mouth 3 (three) times daily. 90 tablet 2  . ranitidine (ZANTAC) 150 MG tablet Take 1 tablet (150 mg total) by mouth 2 (two) times daily as needed for heartburn. 180 tablet 1  . sertraline (ZOLOFT) 100 MG tablet TAKE TWO TABLETS BY MOUTH EVERY DAY 180 tablet 1  . triamterene-hydrochlorothiazide (MAXZIDE-25) 37.5-25 MG tablet Take 1 tablet by mouth daily. 30 tablet 3   No current facility-administered medications for this visit.     Objective: BP 122/62 (  BP Location: Left Arm, Patient Position: Sitting, Cuff Size: Normal)   Pulse 68   Temp 98.1 F (36.7 C) (Oral)   Ht 5\' 11"  (1.803 m)   Wt 225 lb 6.4 oz (102.2 kg)   SpO2 98%   BMI 31.44 kg/m  Gen: NAD, resting comfortably CV: RRR no murmurs rubs or gallops Lungs: CTAB no crackles, wheeze, rhonchi Abdomen: soft/nontender/nondistended/normal bowel sounds.  Ext: trace edema, venous stasis changes extensive on legs Skin: warm, dry Neuro: walks with cane, very hard of hearing, antalgic gait  Assessment/Plan:  From home health RN- need med management- he is not sure he is taking everything on list  Abnormal gait S: since hip fracture trouble walking since his left hip surgery. Using cane for now. Has to use an electric cart to shop and needs family  help for transport.   Spoke with Education officer, museum from adult protective services- patient has made huge strides at home in being able to care for himself- had been thought that he was not safe at home but now A/P: will refer to PT/OT with [facility] for gait issues. Patient is not clear about all medicines so will refer nursing for [facility] as well to help with med management/making sure he is taking medicines listed  Essential hypertension S: controlled on amlodipine 5 mg, losartan 50mg , lasix 20mg , maxzide 25mg , lasix 20mg  BP Readings from Last 3 Encounters:  02/16/18 122/62  11/22/17 (!) 170/62  06/17/17 108/68  A/P:  blood pressure goal of <140/90. Continue current meds  Hyperlipidemia S: poorly controlled on last check.  Lab Results  Component Value Date   CHOL 140 09/25/2013   HDL 34.20 (L) 09/25/2013   LDLCALC 52 09/25/2013   LDLDIRECT 128.3 05/17/2012   TRIG 267.0 (H) 09/25/2013   CHOLHDL 4 09/25/2013   A/P: update lipids on atorvastatin 20mg  now  Hypothyroidism S: On thyroid medication- levothyroxine 75 mcg Lab Results  Component Value Date   TSH 3.26 01/05/2017  A/P: update tsh today  Major depression in partial remission ([place]) S: controlled poorly on zoloft 200mg . appears he is off amitriptyline per his ist Depression screen [facility] 2/9 02/16/2018  Decreased Interest 1  Down, Depressed, Hopeless 2  PHQ - 2 Score 3  Altered sleeping 1  Tired, decreased energy 2  Change in appetite 2  Feeling bad or failure about yourself  2  Trouble concentrating 1  Moving slowly or fidgety/restless 1  Suicidal thoughts 1  PHQ-9 Score 13  Difficult doing work/chores Somewhat difficult  A/P: patient tells me his son had recent overdose and this has been hard on him. We will check back in 3 months from now- may need to consider altering regimen if symptoms remain poorly controlled  Urinary frequency After visit and labs- patient was checking out and saw me in the hall. He mentions he has had  incontinence issues for some time. He has seen Dr. Gaynelle Arabian in past and would like to follow up with him- we discussed that's reasonable as he knows his history- he plans to give him a call. BPH is listed and could be cause. Also thought to have OAB on oxybutynin though if fhas falls this would need to be changed likely  Thrombocytopenia ([place]) Not noted on labs today but low normal. Will trend and if issue does not recur- will remove from list  CKD (chronic kidney disease), stage III Update BMET/GFR today. Avoid nsaids  Future Appointments  Date Time Provider [place]  02/21/2018  1:50 PM  [facility] DEVICE REMOTES [facility]  05/13/2018 11:30 AM [name], MD [facility]  05/19/2018  2:30 PM [name], MD [facility]   Return in about 3 months (around 05/19/2018) for follow up- or sooner if needed if strength not improving.  Lab/Order associations: Essential hypertension - Plan: CBC, Comprehensive metabolic panel, Lipid panel  Hypothyroidism, unspecified type - Plan: TSH  Hyperlipidemia, unspecified hyperlipidemia type - Plan: CBC, Comprehensive metabolic panel, TSH, Lipid panel  Major depressive disorder with single episode, in partial remission (HCC)  Urinary frequency  Thrombocytopenia (HCC)  CKD (chronic kidney disease), stage III (HCC)  Abnormal gait - Plan: Ambulatory referral to Home Health  Return precautions advised.  Garret Reddish, MD

## 2018-02-16 NOTE — Assessment & Plan Note (Signed)
S: controlled poorly on zoloft 200mg . appears he is off amitriptyline per his ist Depression screen [facility] 2/9 02/16/2018  Decreased Interest 1  Down, Depressed, Hopeless 2  PHQ - 2 Score 3  Altered sleeping 1  Tired, decreased energy 2  Change in appetite 2  Feeling bad or failure about yourself  2  Trouble concentrating 1  Moving slowly or fidgety/restless 1  Suicidal thoughts 1  PHQ-9 Score 13  Difficult doing work/chores Somewhat difficult  A/P: patient tells me his son had recent overdose and this has been hard on him. We will check back in 3 months from now- may need to consider altering regimen if symptoms remain poorly controlled

## 2018-02-16 NOTE — Assessment & Plan Note (Signed)
S: poorly controlled on last check.  Lab Results  Component Value Date   CHOL 140 09/25/2013   HDL 34.20 (L) 09/25/2013   LDLCALC 52 09/25/2013   LDLDIRECT 128.3 05/17/2012   TRIG 267.0 (H) 09/25/2013   CHOLHDL 4 09/25/2013   A/P: update lipids on atorvastatin 20mg  now

## 2018-02-16 NOTE — Assessment & Plan Note (Addendum)
After visit and labs- patient was checking out and saw me in the hall. He mentions he has had incontinence issues for some time. He has seen Dr. Gaynelle Arabian in past and would like to follow up with him- we discussed that's reasonable as he knows his history- he plans to give him a call. BPH is listed and could be cause. Also thought to have OAB on oxybutynin though if fhas falls this would need to be changed likely

## 2018-02-16 NOTE — Assessment & Plan Note (Signed)
S: On thyroid medication- levothyroxine 75 mcg Lab Results  Component Value Date   TSH 3.26 01/05/2017  A/P: update tsh today

## 2018-02-16 NOTE — Assessment & Plan Note (Signed)
Update BMET/GFR today. Avoid nsaids

## 2018-02-16 NOTE — Assessment & Plan Note (Signed)
S: controlled on amlodipine 5 mg, losartan 50mg , lasix 20mg , maxzide 25mg , lasix 20mg  BP Readings from Last 3 Encounters:  02/16/18 122/62  11/22/17 (!) 170/62  06/17/17 108/68  A/P:  blood pressure goal of <140/90. Continue current meds

## 2018-02-16 NOTE — Assessment & Plan Note (Signed)
Not noted on labs today but low normal. Will trend and if issue does not recur- will remove from list

## 2018-02-16 NOTE — Patient Instructions (Addendum)
Health Maintenance Due  Topic Date Due  . INFLUENZA VACCINE - lets do this at next visit 02/10/2018  . I would also like for you to sign up for an annual wellness visit with one of our nurses, Cassie or Manuela Schwartz, who both specialize in the annual wellness visit. This is a free benefit under medicare that may help [place] find additional ways to help you. Some highlights are reviewing medications, lifestyle, and doing a dementia screen.    . Please check with your pharmacy to see if they have the shingrix vaccine. If they do- please get this immunization and update [place] by phone call or mychart with dates you receive the vaccine.    We will get you set up with home health  Stop aspirin 325 twice a day and change to 81mg  of aspirin once a day. We may be able to stop aspirin 81mg  eventually as well- I dont see a clear indication on your list  Please stop by lab before you go

## 2018-02-21 ENCOUNTER — Telehealth: Payer: Self-pay | Admitting: Cardiology

## 2018-02-21 ENCOUNTER — Encounter: Payer: Medicare Other | Admitting: *Deleted

## 2018-02-21 NOTE — Telephone Encounter (Signed)
LMOVM reminding pt to send remote transmission.   

## 2018-02-23 ENCOUNTER — Encounter: Payer: Self-pay | Admitting: Cardiology

## 2018-03-24 DIAGNOSIS — I509 Heart failure, unspecified: Secondary | ICD-10-CM | POA: Diagnosis not present

## 2018-03-24 DIAGNOSIS — I739 Peripheral vascular disease, unspecified: Secondary | ICD-10-CM | POA: Diagnosis not present

## 2018-03-24 DIAGNOSIS — I130 Hypertensive heart and chronic kidney disease with heart failure and stage 1 through stage 4 chronic kidney disease, or unspecified chronic kidney disease: Secondary | ICD-10-CM | POA: Diagnosis not present

## 2018-03-24 DIAGNOSIS — Z95810 Presence of automatic (implantable) cardiac defibrillator: Secondary | ICD-10-CM | POA: Diagnosis not present

## 2018-03-24 DIAGNOSIS — Z85828 Personal history of other malignant neoplasm of skin: Secondary | ICD-10-CM | POA: Diagnosis not present

## 2018-03-24 DIAGNOSIS — I447 Left bundle-branch block, unspecified: Secondary | ICD-10-CM | POA: Diagnosis not present

## 2018-03-24 DIAGNOSIS — N183 Chronic kidney disease, stage 3 (moderate): Secondary | ICD-10-CM | POA: Diagnosis not present

## 2018-03-24 DIAGNOSIS — M4802 Spinal stenosis, cervical region: Secondary | ICD-10-CM | POA: Diagnosis not present

## 2018-03-24 DIAGNOSIS — S81802D Unspecified open wound, left lower leg, subsequent encounter: Secondary | ICD-10-CM | POA: Diagnosis not present

## 2018-03-24 DIAGNOSIS — D631 Anemia in chronic kidney disease: Secondary | ICD-10-CM | POA: Diagnosis not present

## 2018-03-25 ENCOUNTER — Telehealth: Payer: Self-pay | Admitting: Family Medicine

## 2018-03-25 NOTE — Telephone Encounter (Signed)
Spoke to [employer] order given for Zerofoam and gauze wrap, change 2 x's a week for wound on lower left leg okay per Dr. Yong Channel. Hunter [name] verbalized understanding. Also went over pt's medication list. Maxide 25 pt did not have a Rx for Hunter [name] said and will need one if suppose to be taking. Told Hunter [name] not sure if pt should be taking last was written 2018. Told her will check with Dr. Yong Channel and call her right back. Hunter [name] verbalized understanding. Called Hunter [name] back told her discussed Maxide with Dr. Yong Channel and he said being as pt has not been on it to stay off was prescribed by Cardiology and blood pressure has been okay off medication. If pt develops increase in edema needs to be seen. Hunter [name] verbalized understanding.

## 2018-03-25 NOTE — Telephone Encounter (Signed)
Copied from [place] [id number]. Topic: General - Other >> Mar 25, 2018 12:00 PM Margot Ables wrote: Reason for CRM: wound on lower left leg where he bumped it - requesting VO for zeroform with gauze wrap. Changing 2x week. Pts medications don't seem lined up - please call with clarification on pt med list. She thinks pt is not aware of how to take medications.

## 2018-03-25 NOTE — Telephone Encounter (Signed)
See note

## 2018-03-29 DIAGNOSIS — M4802 Spinal stenosis, cervical region: Secondary | ICD-10-CM | POA: Diagnosis not present

## 2018-03-29 DIAGNOSIS — N183 Chronic kidney disease, stage 3 (moderate): Secondary | ICD-10-CM | POA: Diagnosis not present

## 2018-03-29 DIAGNOSIS — S81802D Unspecified open wound, left lower leg, subsequent encounter: Secondary | ICD-10-CM | POA: Diagnosis not present

## 2018-03-29 DIAGNOSIS — Z85828 Personal history of other malignant neoplasm of skin: Secondary | ICD-10-CM | POA: Diagnosis not present

## 2018-03-29 DIAGNOSIS — I447 Left bundle-branch block, unspecified: Secondary | ICD-10-CM | POA: Diagnosis not present

## 2018-03-29 DIAGNOSIS — I509 Heart failure, unspecified: Secondary | ICD-10-CM | POA: Diagnosis not present

## 2018-03-29 DIAGNOSIS — I130 Hypertensive heart and chronic kidney disease with heart failure and stage 1 through stage 4 chronic kidney disease, or unspecified chronic kidney disease: Secondary | ICD-10-CM | POA: Diagnosis not present

## 2018-03-29 DIAGNOSIS — D631 Anemia in chronic kidney disease: Secondary | ICD-10-CM | POA: Diagnosis not present

## 2018-03-29 DIAGNOSIS — I739 Peripheral vascular disease, unspecified: Secondary | ICD-10-CM | POA: Diagnosis not present

## 2018-03-29 DIAGNOSIS — Z95810 Presence of automatic (implantable) cardiac defibrillator: Secondary | ICD-10-CM | POA: Diagnosis not present

## 2018-03-30 ENCOUNTER — Other Ambulatory Visit: Payer: Self-pay

## 2018-03-30 NOTE — Patient Outreach (Signed)
[facility]) Care Management  03/30/2018  Hunter [name] 1934/05/11 [id number]   Medication Adherence call to Hunter [name] left a message for patient to call back patient is due on Losartan 50 mg. Hunter [name] is showing past due under [place].  [facility] Direct Dial [phone]  Fax [phone] [email or web address]

## 2018-03-31 DIAGNOSIS — I509 Heart failure, unspecified: Secondary | ICD-10-CM | POA: Diagnosis not present

## 2018-03-31 DIAGNOSIS — S81802D Unspecified open wound, left lower leg, subsequent encounter: Secondary | ICD-10-CM | POA: Diagnosis not present

## 2018-03-31 DIAGNOSIS — Z85828 Personal history of other malignant neoplasm of skin: Secondary | ICD-10-CM | POA: Diagnosis not present

## 2018-03-31 DIAGNOSIS — I447 Left bundle-branch block, unspecified: Secondary | ICD-10-CM | POA: Diagnosis not present

## 2018-03-31 DIAGNOSIS — N183 Chronic kidney disease, stage 3 (moderate): Secondary | ICD-10-CM | POA: Diagnosis not present

## 2018-03-31 DIAGNOSIS — I739 Peripheral vascular disease, unspecified: Secondary | ICD-10-CM | POA: Diagnosis not present

## 2018-03-31 DIAGNOSIS — Z95810 Presence of automatic (implantable) cardiac defibrillator: Secondary | ICD-10-CM | POA: Diagnosis not present

## 2018-03-31 DIAGNOSIS — M4802 Spinal stenosis, cervical region: Secondary | ICD-10-CM | POA: Diagnosis not present

## 2018-03-31 DIAGNOSIS — D631 Anemia in chronic kidney disease: Secondary | ICD-10-CM | POA: Diagnosis not present

## 2018-03-31 DIAGNOSIS — I130 Hypertensive heart and chronic kidney disease with heart failure and stage 1 through stage 4 chronic kidney disease, or unspecified chronic kidney disease: Secondary | ICD-10-CM | POA: Diagnosis not present

## 2018-04-06 DIAGNOSIS — I447 Left bundle-branch block, unspecified: Secondary | ICD-10-CM | POA: Diagnosis not present

## 2018-04-06 DIAGNOSIS — M4802 Spinal stenosis, cervical region: Secondary | ICD-10-CM | POA: Diagnosis not present

## 2018-04-06 DIAGNOSIS — N183 Chronic kidney disease, stage 3 (moderate): Secondary | ICD-10-CM | POA: Diagnosis not present

## 2018-04-06 DIAGNOSIS — Z85828 Personal history of other malignant neoplasm of skin: Secondary | ICD-10-CM | POA: Diagnosis not present

## 2018-04-06 DIAGNOSIS — D631 Anemia in chronic kidney disease: Secondary | ICD-10-CM | POA: Diagnosis not present

## 2018-04-06 DIAGNOSIS — I130 Hypertensive heart and chronic kidney disease with heart failure and stage 1 through stage 4 chronic kidney disease, or unspecified chronic kidney disease: Secondary | ICD-10-CM | POA: Diagnosis not present

## 2018-04-06 DIAGNOSIS — I739 Peripheral vascular disease, unspecified: Secondary | ICD-10-CM | POA: Diagnosis not present

## 2018-04-06 DIAGNOSIS — S81802D Unspecified open wound, left lower leg, subsequent encounter: Secondary | ICD-10-CM | POA: Diagnosis not present

## 2018-04-06 DIAGNOSIS — Z95810 Presence of automatic (implantable) cardiac defibrillator: Secondary | ICD-10-CM | POA: Diagnosis not present

## 2018-04-06 DIAGNOSIS — I509 Heart failure, unspecified: Secondary | ICD-10-CM | POA: Diagnosis not present

## 2018-04-07 ENCOUNTER — Telehealth: Payer: Self-pay | Admitting: Family Medicine

## 2018-04-07 DIAGNOSIS — I447 Left bundle-branch block, unspecified: Secondary | ICD-10-CM | POA: Diagnosis not present

## 2018-04-07 DIAGNOSIS — I130 Hypertensive heart and chronic kidney disease with heart failure and stage 1 through stage 4 chronic kidney disease, or unspecified chronic kidney disease: Secondary | ICD-10-CM | POA: Diagnosis not present

## 2018-04-07 DIAGNOSIS — D631 Anemia in chronic kidney disease: Secondary | ICD-10-CM | POA: Diagnosis not present

## 2018-04-07 DIAGNOSIS — Z95810 Presence of automatic (implantable) cardiac defibrillator: Secondary | ICD-10-CM | POA: Diagnosis not present

## 2018-04-07 DIAGNOSIS — M4802 Spinal stenosis, cervical region: Secondary | ICD-10-CM | POA: Diagnosis not present

## 2018-04-07 DIAGNOSIS — I509 Heart failure, unspecified: Secondary | ICD-10-CM | POA: Diagnosis not present

## 2018-04-07 DIAGNOSIS — Z85828 Personal history of other malignant neoplasm of skin: Secondary | ICD-10-CM | POA: Diagnosis not present

## 2018-04-07 DIAGNOSIS — I739 Peripheral vascular disease, unspecified: Secondary | ICD-10-CM | POA: Diagnosis not present

## 2018-04-07 DIAGNOSIS — S81802D Unspecified open wound, left lower leg, subsequent encounter: Secondary | ICD-10-CM | POA: Diagnosis not present

## 2018-04-07 DIAGNOSIS — N183 Chronic kidney disease, stage 3 (moderate): Secondary | ICD-10-CM | POA: Diagnosis not present

## 2018-04-07 NOTE — Telephone Encounter (Signed)
Copied from [place] [id number]. Topic: Quick Communication - See Telephone Encounter >> Apr 07, 2018 10:40 AM Bea Graff, NT wrote: CRM for notification. See Telephone encounter for: 04/07/18. Elsie [name], PT with [facility] calling to get verbal orders for a 1 time visit with this pt. CB#: [phone]

## 2018-04-07 NOTE — Telephone Encounter (Signed)
See note

## 2018-04-08 NOTE — Telephone Encounter (Signed)
Spoke to [employer] verbal order given for PT x 1 visit, okay per Dr. Yong Channel. Ronalee Belts verbalized understanding and will send order over to sign.

## 2018-04-12 DIAGNOSIS — Z95810 Presence of automatic (implantable) cardiac defibrillator: Secondary | ICD-10-CM

## 2018-04-12 DIAGNOSIS — I447 Left bundle-branch block, unspecified: Secondary | ICD-10-CM

## 2018-04-12 DIAGNOSIS — F329 Major depressive disorder, single episode, unspecified: Secondary | ICD-10-CM

## 2018-04-12 DIAGNOSIS — Z85828 Personal history of other malignant neoplasm of skin: Secondary | ICD-10-CM

## 2018-04-12 DIAGNOSIS — S81802D Unspecified open wound, left lower leg, subsequent encounter: Secondary | ICD-10-CM | POA: Diagnosis not present

## 2018-04-12 DIAGNOSIS — I509 Heart failure, unspecified: Secondary | ICD-10-CM | POA: Diagnosis not present

## 2018-04-12 DIAGNOSIS — I739 Peripheral vascular disease, unspecified: Secondary | ICD-10-CM

## 2018-04-12 DIAGNOSIS — M4802 Spinal stenosis, cervical region: Secondary | ICD-10-CM

## 2018-04-12 DIAGNOSIS — D631 Anemia in chronic kidney disease: Secondary | ICD-10-CM | POA: Diagnosis not present

## 2018-04-12 DIAGNOSIS — N183 Chronic kidney disease, stage 3 (moderate): Secondary | ICD-10-CM | POA: Diagnosis not present

## 2018-04-12 DIAGNOSIS — I130 Hypertensive heart and chronic kidney disease with heart failure and stage 1 through stage 4 chronic kidney disease, or unspecified chronic kidney disease: Secondary | ICD-10-CM | POA: Diagnosis not present

## 2018-04-13 DIAGNOSIS — Z95810 Presence of automatic (implantable) cardiac defibrillator: Secondary | ICD-10-CM | POA: Diagnosis not present

## 2018-04-13 DIAGNOSIS — N183 Chronic kidney disease, stage 3 (moderate): Secondary | ICD-10-CM | POA: Diagnosis not present

## 2018-04-13 DIAGNOSIS — D631 Anemia in chronic kidney disease: Secondary | ICD-10-CM | POA: Diagnosis not present

## 2018-04-13 DIAGNOSIS — I739 Peripheral vascular disease, unspecified: Secondary | ICD-10-CM | POA: Diagnosis not present

## 2018-04-13 DIAGNOSIS — I130 Hypertensive heart and chronic kidney disease with heart failure and stage 1 through stage 4 chronic kidney disease, or unspecified chronic kidney disease: Secondary | ICD-10-CM | POA: Diagnosis not present

## 2018-04-13 DIAGNOSIS — M4802 Spinal stenosis, cervical region: Secondary | ICD-10-CM | POA: Diagnosis not present

## 2018-04-13 DIAGNOSIS — Z85828 Personal history of other malignant neoplasm of skin: Secondary | ICD-10-CM | POA: Diagnosis not present

## 2018-04-13 DIAGNOSIS — S81802D Unspecified open wound, left lower leg, subsequent encounter: Secondary | ICD-10-CM | POA: Diagnosis not present

## 2018-04-13 DIAGNOSIS — I509 Heart failure, unspecified: Secondary | ICD-10-CM | POA: Diagnosis not present

## 2018-04-13 DIAGNOSIS — I447 Left bundle-branch block, unspecified: Secondary | ICD-10-CM | POA: Diagnosis not present

## 2018-04-19 DIAGNOSIS — D631 Anemia in chronic kidney disease: Secondary | ICD-10-CM | POA: Diagnosis not present

## 2018-04-19 DIAGNOSIS — N183 Chronic kidney disease, stage 3 (moderate): Secondary | ICD-10-CM | POA: Diagnosis not present

## 2018-04-19 DIAGNOSIS — I130 Hypertensive heart and chronic kidney disease with heart failure and stage 1 through stage 4 chronic kidney disease, or unspecified chronic kidney disease: Secondary | ICD-10-CM | POA: Diagnosis not present

## 2018-04-19 DIAGNOSIS — I739 Peripheral vascular disease, unspecified: Secondary | ICD-10-CM | POA: Diagnosis not present

## 2018-04-19 DIAGNOSIS — Z85828 Personal history of other malignant neoplasm of skin: Secondary | ICD-10-CM | POA: Diagnosis not present

## 2018-04-19 DIAGNOSIS — S81802D Unspecified open wound, left lower leg, subsequent encounter: Secondary | ICD-10-CM | POA: Diagnosis not present

## 2018-04-19 DIAGNOSIS — M4802 Spinal stenosis, cervical region: Secondary | ICD-10-CM | POA: Diagnosis not present

## 2018-04-19 DIAGNOSIS — Z95810 Presence of automatic (implantable) cardiac defibrillator: Secondary | ICD-10-CM | POA: Diagnosis not present

## 2018-04-19 DIAGNOSIS — I509 Heart failure, unspecified: Secondary | ICD-10-CM | POA: Diagnosis not present

## 2018-04-19 DIAGNOSIS — I447 Left bundle-branch block, unspecified: Secondary | ICD-10-CM | POA: Diagnosis not present

## 2018-05-03 ENCOUNTER — Encounter: Payer: Self-pay | Admitting: Family Medicine

## 2018-05-03 ENCOUNTER — Ambulatory Visit (INDEPENDENT_AMBULATORY_CARE_PROVIDER_SITE_OTHER): Payer: Medicare Other | Admitting: Family Medicine

## 2018-05-03 VITALS — BP 134/72 | HR 75 | Temp 97.8°F | Ht 71.0 in | Wt 235.6 lb

## 2018-05-03 DIAGNOSIS — E785 Hyperlipidemia, unspecified: Secondary | ICD-10-CM

## 2018-05-03 DIAGNOSIS — Z23 Encounter for immunization: Secondary | ICD-10-CM

## 2018-05-03 DIAGNOSIS — F324 Major depressive disorder, single episode, in partial remission: Secondary | ICD-10-CM

## 2018-05-03 DIAGNOSIS — E039 Hypothyroidism, unspecified: Secondary | ICD-10-CM

## 2018-05-03 DIAGNOSIS — I10 Essential (primary) hypertension: Secondary | ICD-10-CM

## 2018-05-03 LAB — CBC
HCT: 41.3 % (ref 39.0–52.0)
Hemoglobin: 13.9 g/dL (ref 13.0–17.0)
MCHC: 33.7 g/dL (ref 30.0–36.0)
MCV: 73.1 fl — ABNORMAL LOW (ref 78.0–100.0)
Platelets: 122 10*3/uL — ABNORMAL LOW (ref 150.0–400.0)
RBC: 5.65 Mil/uL (ref 4.22–5.81)
RDW: 17.7 % — ABNORMAL HIGH (ref 11.5–15.5)
WBC: 6.9 10*3/uL (ref 4.0–10.5)

## 2018-05-03 LAB — TSH: TSH: 5.46 u[IU]/mL — ABNORMAL HIGH (ref 0.35–4.50)

## 2018-05-03 LAB — COMPREHENSIVE METABOLIC PANEL
ALT: 13 U/L (ref 0–53)
AST: 15 U/L (ref 0–37)
Albumin: 4.5 g/dL (ref 3.5–5.2)
Alkaline Phosphatase: 82 U/L (ref 39–117)
BUN: 30 mg/dL — ABNORMAL HIGH (ref 6–23)
CO2: 26 mEq/L (ref 19–32)
Calcium: 9.5 mg/dL (ref 8.4–10.5)
Chloride: 106 mEq/L (ref 96–112)
Creatinine, Ser: 1.19 mg/dL (ref 0.40–1.50)
GFR: 62.19 mL/min (ref >60.00)
Glucose, Bld: 109 mg/dL — ABNORMAL HIGH (ref 70–99)
Potassium: 4 mEq/L (ref 3.5–5.1)
Sodium: 140 mEq/L (ref 135–145)
Total Bilirubin: 0.5 mg/dL (ref 0.2–1.2)
Total Protein: 6.8 g/dL (ref 6.0–8.3)

## 2018-05-03 MED ORDER — LEVOTHYROXINE SODIUM 75 MCG PO TABS: 75.0000 ug | ORAL_TABLET | Freq: Every morning | ORAL | 1 refills | Status: DC

## 2018-05-03 MED ORDER — SERTRALINE HCL 100 MG PO TABS: 100.0000 mg | ORAL_TABLET | Freq: Every day | ORAL | 1 refills | Status: DC

## 2018-05-03 MED ORDER — FUROSEMIDE 20 MG PO TABS: 20.0000 mg | ORAL_TABLET | Freq: Every day | ORAL | 1 refills | Status: DC

## 2018-05-03 MED ORDER — AMLODIPINE BESYLATE 5 MG PO TABS: 5.0000 mg | ORAL_TABLET | Freq: Every day | ORAL | 1 refills | Status: DC

## 2018-05-03 MED ORDER — LOSARTAN POTASSIUM 50 MG PO TABS: ORAL_TABLET | ORAL | 1 refills | Status: DC

## 2018-05-03 MED ORDER — ATORVASTATIN CALCIUM 20 MG PO TABS: 20.0000 mg | ORAL_TABLET | Freq: Every day | ORAL | 3 refills | Status: DC

## 2018-05-03 NOTE — Progress Notes (Signed)
Subjective:  Hunter [name] is a 84 y.o. year old very pleasant male patient who presents for/with See problem oriented charting ROS- No chest pain or shortness of breath. No headache or blurry vision. Still with edema   Past Medical History-  Patient Active Problem List   Diagnosis Date Noted  . Complete heart block ([place]) 02/08/2017    Priority: High  . Benign hypertrophy of prostate 05/10/2015    Priority: High  . Shortness of breath 12/06/2013    Priority: High  . 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 Edema 08/15/2008    Priority: High  . Thrombocytopenia ([place]) 05/23/2012    Priority: Medium  . Neuropathy ([place]) 11/02/2011    Priority: Medium  . CKD (chronic kidney disease), stage III ([place]) 05/27/2011    Priority: Medium  . Memory loss 04/08/2011    Priority: Medium  . Hypothyroidism 08/15/2008    Priority: Medium  . ANEMIA 08/15/2008    Priority: Medium  . Hyperlipidemia 02/02/2007    Priority: Medium  . Major depression in partial remission ([place]) 02/02/2007    Priority: Medium  . Essential hypertension 02/02/2007    Priority: Medium  . Skin lesion of left lower extremity 03/26/2014    Priority: Low  . Stasis eczema 03/26/2014    Priority: Low  . History of skin cancer 03/26/2014    Priority: Low  . Syncope 08/14/2013    Priority: Low  . Low back pain 04/08/2011    Priority: Low  . LBBB (left bundle branch block) 05/12/2010    Priority: Low  . GAIT IMBALANCE 04/23/2010    Priority: Low  . SPINAL STENOSIS, CERVICAL 03/21/2010    Priority: Low  . Constipation 03/19/2010    Priority: Low  . Urinary frequency 09/05/2009    Priority: Low  . Vitamin D deficiency 08/15/2008    Priority: Low  . Allergic rhinitis 02/02/2007    Priority: Low  . GERD 02/02/2007    Priority: Low    Medications- reviewed and updated Current Outpatient Medications  Medication Sig Dispense Refill  . amLODipine (NORVASC) 5 MG tablet Take 1  tablet (5 mg total) by mouth daily. 90 tablet 1  . furosemide (LASIX) 20 MG tablet Take 1 tablet (20 mg total) by mouth daily. 90 tablet 1  . levothyroxine (SYNTHROID, LEVOTHROID) 75 MCG tablet Take 1 tablet (75 mcg total) by mouth every morning. 90 tablet 1  . losartan (COZAAR) 50 MG tablet TAKE 1 TABLET BY MOUTH EVERY DAY 90 tablet 1  . sertraline (ZOLOFT) 100 MG tablet Take 1 tablet (100 mg total) by mouth daily. 90 tablet 1  . atorvastatin (LIPITOR) 20 MG tablet Take 1 tablet (20 mg total) by mouth daily. 90 tablet 3   No current facility-administered medications for this visit.     Objective: BP 134/72 (BP Location: Left Arm, Patient Position: Sitting, Cuff Size: Large)   Pulse 75   Temp 97.8 F (36.6 C) (Oral)   Ht 5\' 11"  (1.803 m)   Wt 235 lb 9.6 oz (106.9 kg)   SpO2 98%   BMI 32.86 kg/m  Gen: NAD, resting comfortably CV: RRR no murmurs rubs or gallops Lungs: CTAB no crackles, wheeze, rhonchi Abdomen: soft/nontender/nondistended/normal bowel sounds. No rebound or guarding.  Ext: 1+ pitting edema Skin: warm, dry    Packets of medication patient has been taking. Not shown- bottles that he is not particularly sure when/how to take.   He  also has amitriptyline 25 mg and simvastatin 40mg  not taking  Assessment/Plan:  Other notes: 1.patient with left hip fracture and surgery earlier this year. Last visit we referred him to PT/OT to help with gait. Spoke with adult protective services at time of last visit who stated he had made huge stride in caring for himself at home. Patient comes in today for follow up and has a hard time explaining his medication regimen. Does not use pill box- instead using packages he received from [employer].  2. Not on b12- will stop b12 on his list. dont see obvious low b12 history- consider checking b12 in future due to confusion 3. Advised AWV so he can get memory evaluated- he reports some mild issues- likely will need to follow up on this at  future visit 4. No home or cell phone so difficult to reach- will mail lab results 5. Asking Roselyn Reef to refer to care connection if they will accept- if not refer to [facility] case management- he really needs medication assistane.   Essential hypertension s: controlled on amlodipine 5 mg, losartan 50 mg, lasix 20mg   Not on maxzide since 03/2018- has bottle but not taking  BP Readings from Last 3 Encounters:  05/03/18 134/72  02/16/18 122/62  11/22/17 (!) 170/62  A/P: We discussed blood pressure goal of <140/90. Continue current meds  Hypothyroidism S: On thyroid medication-levothyroxine 75 mcg but taking in pm Lab Results  Component Value Date   TSH 5.46 (H) 05/03/2018  A/P: mild poor control-continue current medications but taking in the PM instead of AM- encouraged change to morning doses- hoping this normalizes level- can increase dose if needed- likely recheck in 6-12 month range with only minor TSH elevation  Hyperlipidemia S: improved controlled on atorvastatin 20mg  at last visit but LDL still over 100 in august. Considered increase atorvastatin to 40mg  - unfortunately even though he has simvastatin he is not taking it A/P:  We will restart atorvastatin 20mg  and I took his simvastatin to be disposed of- given to nurse manager Cassie Drummond Needs to be on atorvastatin instead of simvastatin due to also being on amlodipine  Major depression in partial remission ([place]) S: patient taking what is in his pill pack which is only 100mg  zoloft. Had been on 200mg  previously. He does not report tremendous difference in depression control. hasnt been using amitriptyline for sleep at all- has about 10 left.  A/P: trying to line up his med list as well as the packets he has at home to reduce confusion - then hoping to get care connection or [facility] case management out to his house. Barriers include needs to pick up new glasses (poor vision) as well as poor hearing (didn't bring hearing aids).  - he may  finish 10 remaining amitriptyline- takes half to help with sleep. With gait issues, fall risk, reported mild confusion- discussed would not want to refill - update phq9 at next visit in under month - need to check in with him about son who overdosed before last visit.   He is concerned about edema but doesn't have SOB- may consider lasix 40mg  but also encouraged compression stockings.   Future Appointments  Date Time Provider [place]  05/13/2018 11:30 AM [name], MD [facility]  05/19/2018  2:30 PM [name], MD [facility]  05/19/2018  4:00 PM [facility] HEALTH COACH [facility]   Lab/Order associations: Hypothyroidism, unspecified type - Plan: TSH  Essential hypertension - Plan: CBC, Comprehensive metabolic panel  Need for prophylactic vaccination and inoculation against influenza - Plan: Flu vaccine HIGH DOSE PF  Hyperlipidemia, unspecified hyperlipidemia type  Major depressive disorder with single episode, in partial remission ([place])  Meds ordered this encounter  Medications  . atorvastatin (LIPITOR) 20 MG tablet    Sig: Take 1 tablet (20 mg total) by mouth daily.    Dispense:  90 tablet    Refill:  3  . levothyroxine (SYNTHROID, LEVOTHROID) 75 MCG tablet    Sig: Take 1 tablet (75 mcg total) by mouth every morning.    Dispense:  90 tablet    Refill:  1  . losartan (COZAAR) 50 MG tablet    Sig: TAKE 1 TABLET BY MOUTH EVERY DAY    Dispense:  90 tablet    Refill:  1  . amLODipine (NORVASC) 5 MG tablet    Sig: Take 1 tablet (5 mg total) by mouth daily.    Dispense:  90 tablet    Refill:  1  . furosemide (LASIX) 20 MG tablet    Sig: Take 1 tablet (20 mg total) by mouth daily.    Dispense:  90 tablet    Refill:  1  . sertraline (ZOLOFT) 100 MG tablet    Sig: Take 1 tablet (100 mg total) by mouth daily.    Dispense:  90 tablet    Refill:  1   Time Stamp The duration of face-to-face time during this visit was greater than 40 minutes.  Greater than 50% of this time was spent in counseling, explanation of diagnosis, planning of further management, and/or coordination of care including primarily medication reconciliation- trying to understand how/why patient takes medications- reinforcing reasons for each meds as result, counseling on need for certain medications, importance of follow up, reason for care connection advice.    Return precautions advised.  Garret Reddish, MD

## 2018-05-03 NOTE — Assessment & Plan Note (Addendum)
s: controlled on amlodipine 5 mg, losartan 50 mg, lasix 20mg   Not on maxzide since 03/2018- has bottle but not taking  BP Readings from Last 3 Encounters:  05/03/18 134/72  02/16/18 122/62  11/22/17 (!) 170/62  A/P: We discussed blood pressure goal of <140/90. Continue current meds

## 2018-05-03 NOTE — Patient Instructions (Addendum)
I only want you taking the medicines on your new med list.   Continue your packets of medicine until they are out except 1. Stop  Senna-S  2. Stop aspirin 325 mg 3. Stop calcium  This will mean you are taking   In morning  1. Levothyroxine 75 mcg (sen tnew refill for you) 2. Lasix 20mg   In evening 1. Amlodipine 5 mg 2. Losartan 50mg  3. Vitamin D  4. Sertraline 100mg    In addition to your packets want you to take  1. Atorvastatin 20mg  in evening  Ideally do not want you to take amitritipyline as can increase risk of falls and confusion  I want you to sign up for a wellness visit with one of our nurse specialists and keep your visit with me on 05/19/18.   I am going to have someone try to come out to your home to help you with medication management

## 2018-05-03 NOTE — Assessment & Plan Note (Signed)
S: On thyroid medication-levothyroxine 75 mcg but taking in pm Lab Results  Component Value Date   TSH 5.46 (H) 05/03/2018  A/P: mild poor control-continue current medications but taking in the PM instead of AM- encouraged change to morning doses- hoping this normalizes level- can increase dose if needed- likely recheck in 6-12 month range with only minor TSH elevation

## 2018-05-03 NOTE — Assessment & Plan Note (Signed)
S: patient taking what is in his pill pack which is only 100mg  zoloft. Had been on 200mg  previously. He does not report tremendous difference in depression control. hasnt been using amitriptyline for sleep at all- has about 10 left.  A/P: trying to line up his med list as well as the packets he has at home to reduce confusion - then hoping to get care connection or [facility] case management out to his house. Barriers include needs to pick up new glasses (poor vision) as well as poor hearing (didn't bring hearing aids).  - he may finish 10 remaining amitriptyline- takes half to help with sleep. With gait issues, fall risk, reported mild confusion- discussed would not want to refill - update phq9 at next visit in under month - need to check in with him about son who overdosed before last visit.

## 2018-05-03 NOTE — Assessment & Plan Note (Signed)
S: improved controlled on atorvastatin 20mg  at last visit but LDL still over 100 in august. Considered increase atorvastatin to 40mg  - unfortunately even though he has simvastatin he is not taking it A/P:  We will restart atorvastatin 20mg  and I took his simvastatin to be disposed of- given to nurse manager Paxton Needs to be on atorvastatin instead of simvastatin due to also being on amlodipine

## 2018-05-04 ENCOUNTER — Other Ambulatory Visit: Payer: Self-pay

## 2018-05-09 ENCOUNTER — Telehealth: Payer: Self-pay

## 2018-05-09 NOTE — Telephone Encounter (Signed)
Spoke with Manus Gunning from [facility]. She stated that when they reviewed his chart he lives about 50 miles from their office. Their max distance is 30 miles.   Per OV note 05/03/2018:  Asking Jamie to refer to care connection if they will accept- if not refer to [facility] case management- he really needs medication assistane.   I am placing a referral to [facility] case management

## 2018-05-09 NOTE — Telephone Encounter (Signed)
Thanks jamie- we may run into the same issue unfortunately. We may need to get him to a physician closer to him to get his local benefits/resources. Will see how [facility] case referral goes

## 2018-05-13 ENCOUNTER — Encounter: Payer: Self-pay | Admitting: Internal Medicine

## 2018-05-13 ENCOUNTER — Ambulatory Visit (INDEPENDENT_AMBULATORY_CARE_PROVIDER_SITE_OTHER): Payer: Medicare Other | Admitting: Internal Medicine

## 2018-05-13 VITALS — BP 138/72 | HR 65 | Ht 71.0 in | Wt 233.2 lb

## 2018-05-13 DIAGNOSIS — R0602 Shortness of breath: Secondary | ICD-10-CM

## 2018-05-13 DIAGNOSIS — I10 Essential (primary) hypertension: Secondary | ICD-10-CM | POA: Diagnosis not present

## 2018-05-13 DIAGNOSIS — R609 Edema, unspecified: Secondary | ICD-10-CM | POA: Diagnosis not present

## 2018-05-13 DIAGNOSIS — I442 Atrioventricular block, complete: Secondary | ICD-10-CM

## 2018-05-13 LAB — CUP PACEART INCLINIC DEVICE CHECK
Battery Remaining Longevity: 103 mo
Battery Voltage: 2.98 V
Brady Statistic RA Percent Paced: 21 %
Brady Statistic RV Percent Paced: 99.46 %
Date Time Interrogation Session: 20191101152708
Implantable Lead Implant Date: 20180730
Implantable Lead Implant Date: 20180730
Implantable Lead Location: 753859
Implantable Lead Location: 753860
Implantable Lead Model: 5076
Implantable Lead Model: 5076
Implantable Pulse Generator Implant Date: 20180730
Lead Channel Impedance Value: 412.5 Ohm
Lead Channel Impedance Value: 450 Ohm
Lead Channel Pacing Threshold Amplitude: 0.5 V
Lead Channel Pacing Threshold Amplitude: 0.5 V
Lead Channel Pacing Threshold Amplitude: 0.75 V
Lead Channel Pacing Threshold Amplitude: 0.75 V
Lead Channel Pacing Threshold Pulse Width: 0.5 ms
Lead Channel Pacing Threshold Pulse Width: 0.5 ms
Lead Channel Pacing Threshold Pulse Width: 0.5 ms
Lead Channel Pacing Threshold Pulse Width: 0.5 ms
Lead Channel Sensing Intrinsic Amplitude: 0.8 mV
Lead Channel Sensing Intrinsic Amplitude: 12 mV
Lead Channel Setting Pacing Amplitude: 2 V
Lead Channel Setting Pacing Amplitude: 2.5 V
Lead Channel Setting Pacing Pulse Width: 0.5 ms
Lead Channel Setting Sensing Sensitivity: 4 mV
Pulse Gen Model: 2272
Pulse Gen Serial Number: 8930553

## 2018-05-13 NOTE — Patient Instructions (Addendum)
Medication Instructions:  Continue all current medications.  Labwork: none  Testing/Procedures:  Your physician has requested that you have an echocardiogram. Echocardiography is a painless test that uses sound waves to create images of your heart. It provides your doctor with information about the size and shape of your heart and how well your heart's chambers and valves are working. This procedure takes approximately one hour. There are no restrictions for this procedure.  Office will contact with results via phone or letter.    Follow-Up: 1 year   Any Other Special Instructions Will Be Listed Below (If Applicable). Remote monitoring is used to monitor your Pacemaker of ICD from home. This monitoring reduces the number of office visits required to check your device to one time per year. It allows [place] to keep an eye on the functioning of your device to ensure it is working properly. You are scheduled for a device check from home on 08/15/18. You may send your transmission at any time that day. If you have a wireless device, the transmission will be sent automatically. After your physician reviews your transmission, you will receive a postcard with your next transmission date.  If you need a refill on your cardiac medications before your next appointment, please call your pharmacy.

## 2018-05-13 NOTE — Progress Notes (Signed)
PCP: [name], MD   Primary EP:  Dr Caryl Comes implanted PPM,  Dr Rayann Heman follows the device  Hunter [name] is a 84 y.o. male who presents today for routine electrophysiology followup.  Since last being seen in our clinic, the patient reports doing reasonably well.  He has chronic venous stasis changes.  He has some SOB.  Today, he denies symptoms of palpitations, chest pain,  dizziness, presyncope, or syncope.  The patient is otherwise without complaint today.   Past Medical History:  Diagnosis Date  . Anemia   . Anxiety   . Arthritis    "knees" (02/08/2017)  . Chronic lower back pain   . Complete heart block ([place]) 02/08/2017  . Depression   . Eczema   . GERD (gastroesophageal reflux disease) occasional  . Hearing loss of both ears   . Hx of echocardiogram    Echo (07/2013): Mild LVH, EF 96-75%, grade 1 diastolic dysfunction, mild LAE, PASP 23  . Hyperlipidemia   . Hypertension   . Hypothyroidism   . LBBB (left bundle branch block)   . Left patella fracture 2018   "no OR" (02/08/2017)  . Nocturia   . Peripheral vascular disease (HCC) LOWER EXTREMITIES  . Squamous cell skin cancer, penis: glans ([place]) 05/2010   Initial excision 11/11; recurrence, excision and laser Rx 9/13   Past Surgical History:  Procedure Laterality Date  . CIRCUMCISION/ LASER DISSECTION PENILE GLANS CANCER  06-02-2010  . CYSTOSCOPY WITH URETHRAL DILATATION  03/28/2012   Procedure: CYSTOSCOPY WITH URETHRAL DILATATION;  Surgeon: Ailene Rud, MD;  Location: [facility];  Service: Urology;  Laterality: N/A;  excision biopsy extensive meatal penile carcinoma meatoplasty  . EXCISION RIGHT WRIST BENIGN TUMOR  2002 (APPROX)  . hip surgery    . INCISION AND DRAINAGE DEEP NECK ABSCESS    . INGUINAL HERNIA REPAIR  1970's  . KNEE ARTHROSCOPY Right 2017  . PACEMAKER IMPLANT N/A 02/08/2017   Procedure: Pacemaker Implant;  Surgeon: Deboraha Sprang, MD;  Location: [place] [facility];   Service: Cardiovascular;  Laterality: N/A;  . PACEMAKER IMPLANT  02/08/2017   SJM Assurity MRI dual chamber PPM implanted by Dr Caryl Comes for complete heart block  . PENILE BX  05-09-2010  . TONSILLECTOMY    . TRANSURETHRAL RESECTION OF PROSTATE N/A 05/10/2015   Procedure: CYSTOSCOPY, URETHRAL MEATAL DILATION, TRANSURETHRAL RESECTION OF THE PROSTATE (TURP);  Surgeon: Carolan Clines, MD;  [facility];  Service: Urology;  Laterality: N/A;    ROS- all systems are reviewed and negative except as per HPI above  Current Outpatient Medications  Medication Sig Dispense Refill  . amLODipine (NORVASC) 5 MG tablet Take 1 tablet (5 mg total) by mouth daily. 90 tablet 1  . atorvastatin (LIPITOR) 20 MG tablet Take 1 tablet (20 mg total) by mouth daily. 90 tablet 3  . furosemide (LASIX) 20 MG tablet Take 1 tablet (20 mg total) by mouth daily. 90 tablet 1  . levothyroxine (SYNTHROID, LEVOTHROID) 75 MCG tablet Take 1 tablet (75 mcg total) by mouth every morning. 90 tablet 1  . losartan (COZAAR) 50 MG tablet TAKE 1 TABLET BY MOUTH EVERY DAY 90 tablet 1  . sertraline (ZOLOFT) 100 MG tablet Take 1 tablet (100 mg total) by mouth daily. 90 tablet 1   No current facility-administered medications for this visit.     Physical Exam: Vitals:   05/13/18 1148  BP: 138/72  Pulse: 65  SpO2: 96%  Weight:  233 lb 3.2 oz (105.8 kg)  Height: 5\' 11"  (1.803 m)    GEN- The patient is elderly appearing, alert, requires frequent redirection during exam Head- normocephalic, atraumatic Eyes-  Sclera clear, conjunctiva pink Ears- hearing intact Oropharynx- clear Lungs- Clear to ausculation bilaterally, normal work of breathing Chest- pacemaker pocket is well healed Heart- Regular rate and rhythm, no murmurs, rubs or gallops, PMI not laterally displaced GI- soft, NT, ND, + BS Extremities- no clubbing, cyanosis, + edema with venous stasis changes  Pacemaker interrogation- reviewed in detail today,  See PACEART  report   Assessment and Plan:  1. Symptomatic complete heart block with syncope Normal pacemaker function See Pace Art report No changes today  2. HTN Stable No change required today  3. SOB/ edema (chronic) Obtain echo to evaluate for cardiac causes.  If echo is ok, no further CV workup is planned  Merlin Return in a year  [name] MD, [facility] 05/13/2018 12:24 PM

## 2018-05-18 ENCOUNTER — Other Ambulatory Visit: Payer: Self-pay

## 2018-05-18 NOTE — Progress Notes (Signed)
Subjective:   Hunter [name] is a 84 y.o. male who presents for Medicare Annual/Subsequent preventive examination.  Reports health as fair but poor compliance reported  Dr. Yong Channel at 2:30 Call to Jed Limerick ([facility]) to see if she can meet with the patient here today. Has been seen by APS  Care connection can not help as he is in [place] Poor vision; but has new glasses and sees well   Living environment; residence and Firearm Safety: Lives alone w/ his dog and cat. 1-story house/ trailer, tub-shower, no firearms.   There are no preventive care reminders to display for this patient.      Objective:    Vitals: BP 110/60   Pulse 97   Ht 5\' 11"  (1.803 m)   Wt 233 lb (105.7 kg)   BMI 32.50 kg/m   Body mass index is 32.5 kg/m.  Advanced Directives 05/19/2018 02/08/2017 12/28/2016 06/19/2016 05/10/2015 05/03/2015 03/28/2012  Does Patient Have a Medical Advance Directive? No No No No No No Patient does not have advance directive;Patient would like information  Does patient want to make changes to medical advance directive? - - - - Yes - information given Yes - information given -  Would patient like information on creating a medical advance directive? - No - Patient declined Yes (MAU/Ambulatory/Procedural Areas - Information given) - No - patient declined information - Advance directive packet given  Pre-existing out of facility DNR order (yellow form or pink MOST form) - - - - - - No    Tobacco Social History   Tobacco Use  Smoking Status Never Smoker  Smokeless Tobacco Never Used     Counseling given: Yes   Clinical Intake:        Past Medical History:  Diagnosis Date  . Anemia   . Anxiety   . Arthritis    "knees" (02/08/2017)  . Chronic lower back pain   . Complete heart block ([place]) 02/08/2017  . Depression   . Eczema   . GERD (gastroesophageal reflux disease) occasional  . Hearing loss of both ears   . Hx of echocardiogram    Echo (07/2013): Mild LVH, EF  70-96%, grade 1 diastolic dysfunction, mild LAE, PASP 23  . Hyperlipidemia   . Hypertension   . Hypothyroidism   . LBBB (left bundle branch block)   . Left patella fracture 2018   "no OR" (02/08/2017)  . Nocturia   . Peripheral vascular disease (HCC) LOWER EXTREMITIES  . Squamous cell skin cancer, penis: glans ([place]) 05/2010   Initial excision 11/11; recurrence, excision and laser Rx 9/13   Past Surgical History:  Procedure Laterality Date  . CIRCUMCISION/ LASER DISSECTION PENILE GLANS CANCER  06-02-2010  . CYSTOSCOPY WITH URETHRAL DILATATION  03/28/2012   Procedure: CYSTOSCOPY WITH URETHRAL DILATATION;  Surgeon: Ailene Rud, MD;  Location: [facility];  Service: Urology;  Laterality: N/A;  excision biopsy extensive meatal penile carcinoma meatoplasty  . EXCISION RIGHT WRIST BENIGN TUMOR  2002 (APPROX)  . hip surgery    . INCISION AND DRAINAGE DEEP NECK ABSCESS    . INGUINAL HERNIA REPAIR  1970's  . KNEE ARTHROSCOPY Right 2017  . PACEMAKER IMPLANT N/A 02/08/2017   Procedure: Pacemaker Implant;  Surgeon: Deboraha Sprang, MD;  Location: [place] [facility];  Service: Cardiovascular;  Laterality: N/A;  . PACEMAKER IMPLANT  02/08/2017   SJM Assurity MRI dual chamber PPM implanted by Dr Caryl Comes for complete heart block  . PENILE BX  05-09-2010  . TONSILLECTOMY    . TRANSURETHRAL RESECTION OF PROSTATE N/A 05/10/2015   Procedure: CYSTOSCOPY, URETHRAL MEATAL DILATION, TRANSURETHRAL RESECTION OF THE PROSTATE (TURP);  Surgeon: Carolan Clines, MD;  [facility];  Service: Urology;  Laterality: N/A;   Family History  Problem Relation Age of Onset  . Lung cancer Mother   . Arthritis Unknown        family hx  . Hyperlipidemia Unknown        family hx  . Hypertension Unknown        family hx  . Prostate cancer Unknown        family hx   Social History   Socioeconomic History  . Marital status: Divorced    Spouse name: Not on file  . Number of children: Not on  file  . Years of education: Not on file  . Highest education level: Not on file  Occupational History  . Not on file  Social Needs  . Financial resource strain: Not on file  . Food insecurity:    Worry: Not on file    Inability: Not on file  . Transportation needs:    Medical: Not on file    Non-medical: Not on file  Tobacco Use  . Smoking status: Never Smoker  . Smokeless tobacco: Never Used  Substance and Sexual Activity  . Alcohol use: Yes    Comment: 02/08/2017 "nothing in the 2000s"  . Drug use: No  . Sexual activity: Not Currently  Lifestyle  . Physical activity:    Days per week: Not on file    Minutes per session: Not on file  . Stress: Not on file  Relationships  . Social connections:    Talks on phone: Not on file    Gets together: Not on file    Attends religious service: Not on file    Active member of club or organization: Not on file    Attends meetings of clubs or organizations: Not on file    Relationship status: Not on file  Other Topics Concern  . Not on file  Social History Narrative  . Not on file    Outpatient Encounter Medications as of 05/19/2018  Medication Sig  . amLODipine (NORVASC) 5 MG tablet Take 1 tablet (5 mg total) by mouth daily.  Marland Kitchen atorvastatin (LIPITOR) 20 MG tablet Take 1 tablet (20 mg total) by mouth daily.  . furosemide (LASIX) 40 MG tablet Take 1 tablet (40 mg total) by mouth daily.  Marland Kitchen levothyroxine (SYNTHROID, LEVOTHROID) 75 MCG tablet Take 1 tablet (75 mcg total) by mouth every morning.  Marland Kitchen losartan (COZAAR) 50 MG tablet TAKE 1 TABLET BY MOUTH EVERY DAY  . sertraline (ZOLOFT) 100 MG tablet Take 2 tablets (200 mg total) by mouth daily.  . [DISCONTINUED] furosemide (LASIX) 20 MG tablet Take 1 tablet (20 mg total) by mouth daily.  . [DISCONTINUED] sertraline (ZOLOFT) 100 MG tablet Take 1 tablet (100 mg total) by mouth daily.   No facility-administered encounter medications on file as of 05/19/2018.     Activities of Daily  Living In your present state of health, do you have any difficulty performing the following activities: 05/19/2018  Hearing? N  Vision? N  Difficulty concentrating or making decisions? N  Walking or climbing stairs? Y  Dressing or bathing? N  Doing errands, shopping? N  Preparing Food and eating ? N  Using the Toilet? N  In the past six months, have you accidently leaked urine? N  Do you have problems with loss of bowel control? N  Managing your Medications? N  Managing your Finances? N  Housekeeping or managing your Housekeeping? N  Some recent data might be hidden    Patient Care Team: [name], MD as PCP - General (Family Medicine) Carolan Clines, MD as Consulting Physician (Urology) Stanford Breed, Denice Bors, MD as Consulting Physician (Cardiology) [facility] Druscilla Brownie, MD as Consulting Physician (Dermatology) Dannielle Karvonen, RN as [place] Management   Assessment:   This is a routine wellness examination for Estefano.  Exercise Activities and Dietary recommendations Current Exercise Habits: Home exercise routine  Goals    . Increase physical activity       Fall Risk Fall Risk  05/19/2018 05/03/2018 12/28/2016 07/31/2015 08/14/2013  Falls in the past year? 1 No Yes No Yes  Number falls in past yr: 0 - 1 - 1  Injury with Fall? - - Yes - -  Risk for fall due to : - - Impaired mobility;History of fall(s) - -     Depression Screen PHQ 2/9 Scores 05/19/2018 05/19/2018 02/16/2018 06/17/2017  PHQ - 2 Score 0 2 3 2   PHQ- 9 Score - 10 13 7    Moved from [place] to [place] now using drugs;  Son left and now he is living in son's cousin's house   Cognitive Function MMSE - Alex Exam 05/19/2018 05/19/2018 05/19/2018 12/28/2016  Orientation to time - - 4 5  Orientation to Place - - 5 5  Registration - - 3 3  Attention/ Calculation - - 5 5  Recall - - 3 3  Language- name 2 objects - - 2 2  Language-  repeat - - 1 1  Language- follow 3 step command - 3 - 3  Language- read & follow direction - 1 - 1  Write a sentence 1 1 - 1  Copy design 1 1 - 1  Total score - - - 30        Immunization History  Administered Date(s) Administered  . Influenza Whole 07/13/2005  . Influenza, High Dose Seasonal PF 04/03/2016, 05/03/2018  . Influenza,inj,Quad PF,6+ Mos 05/11/2015  . Pneumococcal Conjugate-13 04/03/2016  . Pneumococcal Polysaccharide-23 08/15/2008  . Td 08/15/2008  . Zoster 05/23/2012      Screening Tests Health Maintenance  Topic Date Due  . TETANUS/TDAP  08/15/2018  . INFLUENZA VACCINE  Completed  . PNA vac Low Risk Adult  Completed       Plan:      PCP Notes    Health Maintenance Has hearing aids but does not wear them Today, he is deaf. Notes had to be written for him to understand. Printed out Division of hard of hearing and adaptive equipment, including TTY and agreed Manuela Schwartz to try to outreach next week  Ilda Mori ([facility]) called while Mr. Kossman was in the office. The patient agreed to fup. Questions written down and the patient responded. Apt set up for 11/15 at 11am. The patient verbalized understanding  Abnormal Screens  HOH; may be deaf, not sure how much hearing aids are helping. Had change in vendor and lost TV adaptive equipment.  Will see if he can access services.   MMSE 29/30; stated it was the 8th. Very social; 90% of the conversation was not heard. Very alert and functioning mentally. Able to discuss changes in Medication and what he needs to take. Not sure how much is non-compliance vs  hearing issues    Referrals  [facility] access gained and will try to go out Friday 10/15 /11am  Patient concerns; States he is depressed but is engaged today, smiles but does discuss his son who is on "drugs" per his report and left the home.  [facility] to evaluate needs as well   Nurse Concerns; As noted   Next PCP apt 12/5/at 3:30        I have  personally reviewed and noted the following in the patient's chart:   . Medical and social history . Use of alcohol, tobacco or illicit drugs  . Current medications and supplements . Functional ability and status . Nutritional status . Physical activity . Advanced directives . List of other physicians . Hospitalizations, surgeries, and ER visits in previous 12 months . Vitals . Screenings to include cognitive, depression, and falls . Referrals and appointments  In addition, I have reviewed and discussed with patient certain preventive protocols, quality metrics, and best practice recommendations. A written personalized care plan for preventive services as well as general preventive health recommendations were provided to patient.     Wynetta Fines, RN  05/19/2018

## 2018-05-18 NOTE — Patient Outreach (Addendum)
[name]) Care Management  05/18/2018  Hunter [name] Aug 22, 1933 [id number]  TELEPHONE SCREENING Referral date: 05/09/18 Referral source: primary MD referral  Referral reason: medication management/ education  Insurance: united health care Attempt #1  Telephone call to patient regarding referral. Unable to reach patient. HIPAA compliant voice message left with call back phone number.   PLAN: RNCM will attempt 2nd telephone call to patient within 4 business days. RNCM will send outreach letter.   Quinn Plowman RN,BSN, [place] Telephonic  [phone]

## 2018-05-19 ENCOUNTER — Encounter: Payer: Self-pay | Admitting: Family Medicine

## 2018-05-19 ENCOUNTER — Other Ambulatory Visit: Payer: Self-pay

## 2018-05-19 ENCOUNTER — Ambulatory Visit (INDEPENDENT_AMBULATORY_CARE_PROVIDER_SITE_OTHER): Payer: Medicare Other | Admitting: Family Medicine

## 2018-05-19 ENCOUNTER — Ambulatory Visit (INDEPENDENT_AMBULATORY_CARE_PROVIDER_SITE_OTHER): Payer: Medicare Other

## 2018-05-19 VITALS — BP 110/60 | HR 70 | Temp 97.7°F | Ht 71.0 in | Wt 233.0 lb

## 2018-05-19 VITALS — BP 110/60 | HR 97 | Ht 71.0 in | Wt 233.0 lb

## 2018-05-19 DIAGNOSIS — E785 Hyperlipidemia, unspecified: Secondary | ICD-10-CM | POA: Diagnosis not present

## 2018-05-19 DIAGNOSIS — Z0000 Encounter for general adult medical examination without abnormal findings: Secondary | ICD-10-CM

## 2018-05-19 DIAGNOSIS — F324 Major depressive disorder, single episode, in partial remission: Secondary | ICD-10-CM

## 2018-05-19 DIAGNOSIS — I10 Essential (primary) hypertension: Secondary | ICD-10-CM

## 2018-05-19 DIAGNOSIS — E039 Hypothyroidism, unspecified: Secondary | ICD-10-CM | POA: Diagnosis not present

## 2018-05-19 MED ORDER — SERTRALINE HCL 100 MG PO TABS: 200.0000 mg | ORAL_TABLET | Freq: Every day | ORAL | 1 refills | Status: DC

## 2018-05-19 MED ORDER — FUROSEMIDE 40 MG PO TABS: 40.0000 mg | ORAL_TABLET | Freq: Every day | ORAL | 1 refills | Status: DC

## 2018-05-19 NOTE — Assessment & Plan Note (Signed)
S: he states he has felt somewhat down. Weather is tough on him plus joints hurting more. He is compliant with zoloft 100mg  Depression screen [facility] 2/9 05/19/2018  Decreased Interest 0  Down, Depressed, Hopeless 2  PHQ - 2 Score 2  Altered sleeping 2  Tired, decreased energy 2  Change in appetite 1  Feeling bad or failure about yourself  1  Trouble concentrating 1  Moving slowly or fidgety/restless 1  Suicidal thoughts 0  PHQ-9 Score 10  Difficult doing work/chores Somewhat difficult  A/P: Poor control- will increase to zoloft 200mg  (had been on this in past and done better)  Amitriptyline- not prescribing more of this due to increased fall risk

## 2018-05-19 NOTE — Assessment & Plan Note (Signed)
S: Patient had his simvastatin stopped last visit by [place].  We restarted his atorvastatin 20 mg. A/P:  compliant with atorvastatin 20mg - update LDL at follow up

## 2018-05-19 NOTE — Progress Notes (Signed)
Subjective:  Hunter [name] is a 84 y.o. year old very pleasant male patient who presents for/with See problem oriented charting ROS- still with swelling in legs, arthritic pains. Some shortness of breath but not worsening. No chest pain reported.    Past Medical History-  Patient Active Problem List   Diagnosis Date Noted  . Complete heart block ([place]) 02/08/2017    Priority: High  . Benign hypertrophy of prostate 05/10/2015    Priority: High  . Shortness of breath 12/06/2013    Priority: High  . 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 Edema 08/15/2008    Priority: High  . Thrombocytopenia ([place]) 05/23/2012    Priority: Medium  . Neuropathy ([place]) 11/02/2011    Priority: Medium  . CKD (chronic kidney disease), stage III ([place]) 05/27/2011    Priority: Medium  . Memory loss 04/08/2011    Priority: Medium  . Hypothyroidism 08/15/2008    Priority: Medium  . ANEMIA 08/15/2008    Priority: Medium  . Hyperlipidemia 02/02/2007    Priority: Medium  . Major depression in partial remission ([place]) 02/02/2007    Priority: Medium  . Essential hypertension 02/02/2007    Priority: Medium  . Skin lesion of left lower extremity 03/26/2014    Priority: Low  . Stasis eczema 03/26/2014    Priority: Low  . History of skin cancer 03/26/2014    Priority: Low  . Syncope 08/14/2013    Priority: Low  . Low back pain 04/08/2011    Priority: Low  . LBBB (left bundle branch block) 05/12/2010    Priority: Low  . GAIT IMBALANCE 04/23/2010    Priority: Low  . SPINAL STENOSIS, CERVICAL 03/21/2010    Priority: Low  . Constipation 03/19/2010    Priority: Low  . Urinary frequency 09/05/2009    Priority: Low  . Vitamin D deficiency 08/15/2008    Priority: Low  . Allergic rhinitis 02/02/2007    Priority: Low  . GERD 02/02/2007    Priority: Low    Medications- reviewed and updated Current Outpatient Medications  Medication Sig Dispense Refill  . amLODipine  (NORVASC) 5 MG tablet Take 1 tablet (5 mg total) by mouth daily. 90 tablet 1  . atorvastatin (LIPITOR) 20 MG tablet Take 1 tablet (20 mg total) by mouth daily. 90 tablet 3  . furosemide (LASIX) 40 MG tablet Take 1 tablet (40 mg total) by mouth daily. 90 tablet 1  . levothyroxine (SYNTHROID, LEVOTHROID) 75 MCG tablet Take 1 tablet (75 mcg total) by mouth every morning. 90 tablet 1  . losartan (COZAAR) 50 MG tablet TAKE 1 TABLET BY MOUTH EVERY DAY 90 tablet 1  . sertraline (ZOLOFT) 100 MG tablet Take 2 tablets (200 mg total) by mouth daily. 180 tablet 1   No current facility-administered medications for this visit.     Objective: BP 110/60 (BP Location: Left Arm, Patient Position: Sitting, Cuff Size: Large)   Pulse 70   Temp 97.7 F (36.5 C) (Oral)   Ht 5\' 11"  (1.803 m)   Wt 233 lb (105.7 kg)   SpO2 96%   BMI 32.50 kg/m  Gen: NAD, resting comfortably CV: RRR no murmurs rubs or gallops Lungs: CTAB no crackles, wheeze, rhonchi Abdomen: soft/nontender/nondistended Ext: 1+ edema with venous stasis changes- light weeping right leg Skin: warm, dry Neuro: Loud speech-short of hearing, moves all extremities  Assessment/Plan:  Other notes: 1. Cardiology has ordered echocardiogram to evaluate shortness of breath  further.  If echo is normal no further CV work-up is planned per Dr. Rayann Heman 2. Handicap placard  Given 3. Moved thyroid medicine to the morning  Essential hypertension S: controlled on amlodipine 5 mg, losartan 50 mg, Lasix 20 mg  Continued edema- some weeping form legs.  BP Readings from Last 3 Encounters:  05/19/18 110/60  05/13/18 138/72  05/03/18 134/72  A/P: We discussed blood pressure goal of <140/90. Continue current meds  Hypothyroidism S: On thyroid medication-. levothyroxine 75 mcg-encouraged him to move this to morning dose Lab Results  Component Value Date   TSH 5.46 (H) 05/03/2018  A/P: mild elevation of tsh- too early for recheck with him being on morning  dose- check this next visit and continue present dose  Hyperlipidemia S: Patient had his simvastatin stopped last visit by [place].  We restarted his atorvastatin 20 mg. A/P:  compliant with atorvastatin 20mg - update LDL at follow up    Major depression in partial remission ([place]) S: he states he has felt somewhat down. Weather is tough on him plus joints hurting more. He is compliant with zoloft 100mg  Depression screen [facility] 2/9 05/19/2018  Decreased Interest 0  Down, Depressed, Hopeless 2  PHQ - 2 Score 2  Altered sleeping 2  Tired, decreased energy 2  Change in appetite 1  Feeling bad or failure about yourself  1  Trouble concentrating 1  Moving slowly or fidgety/restless 1  Suicidal thoughts 0  PHQ-9 Score 10  Difficult doing work/chores Somewhat difficult  A/P: Poor control- will increase to zoloft 200mg  (had been on this in past and done better)  Amitriptyline- not prescribing more of this due to increased fall risk   Future Appointments  Date Time Provider [place]  05/23/2018  9:30 AM Dannielle Karvonen, RN [facility] None  05/26/2018  9:00 AM [facility] [place] 1 [facility]  06/16/2018  3:30 PM [name], MD [facility] P[facility]  08/15/2018  7:00 AM [facility] DEVICE REMOTES [facility]  05/19/2019  9:15 AM Allred, Jeneen Rinks, MD [facility]   Encouraged 3-6 week follow up  Meds ordered this encounter  Medications  . sertraline (ZOLOFT) 100 MG tablet    Sig: Take 2 tablets (200 mg total) by mouth daily.    Dispense:  180 tablet    Refill:  1  . furosemide (LASIX) 40 MG tablet    Sig: Take 1 tablet (40 mg total) by mouth daily.    Dispense:  90 tablet    Refill:  1   Return precautions advised.  Garret Reddish, MD

## 2018-05-19 NOTE — Progress Notes (Signed)
I have reviewed and agree with note, evaluation, plan. See my separate note from today  Fantastic job with note Manuela Schwartz as well as getting patient set up with [facility] and hard of hearing services. I think this could be extremely valuable for him.   Garret Reddish, MD

## 2018-05-19 NOTE — Patient Instructions (Addendum)
Hunter [name] , Thank you for taking time to come for your Medicare Wellness Visit. I appreciate your ongoing commitment to your health goals. Please review the following plan we discussed and let me know if I can assist you in the future.   Will have [place] so the nurse can come out Nov 15th at [place] [facility], BUT WILL TRY AS BEST THEY CAN TO COME  Manuela Schwartz will make a referral to the Division of Hard of Hearing for adaptive equipment, as well as to set up TTY  See your notes  Will tell Devina to try to come out any day of the week     These are the goals we discussed: Goals    . Increase physical activity       This is a list of the screening recommended for you and due dates:  Health Maintenance  Topic Date Due  . Tetanus Vaccine  08/15/2018  . Flu Shot  Completed  . Pneumonia vaccines  Completed     Fall Prevention in the Home Falls can cause injuries. They can happen to people of all ages. There are many things you can do to make your home safe and to help prevent falls. What can I do on the outside of my home?  Regularly fix the edges of walkways and driveways and fix any cracks.  Remove anything that might make you trip as you walk through a door, such as a raised step or threshold.  Trim any bushes or trees on the path to your home.  Use bright outdoor lighting.  Clear any walking paths of anything that might make someone trip, such as rocks or tools.  Regularly check to see if handrails are loose or broken. Make sure that both sides of any steps have handrails.  Any raised decks and porches should have guardrails on the edges.  Have any leaves, snow, or ice cleared regularly.  Use sand or salt on walking paths during winter.  Clean up any spills in your garage right away. This includes oil or grease spills. What can I do in the bathroom?  Use night lights.  Install grab bars by the toilet and in the tub and shower. Do not  use towel bars as grab bars.  Use non-skid mats or decals in the tub or shower.  If you need to sit down in the shower, use a plastic, non-slip stool.  Keep the floor dry. Clean up any water that spills on the floor as soon as it happens.  Remove soap buildup in the tub or shower regularly.  Attach bath mats securely with double-sided non-slip rug tape.  Do not have throw rugs and other things on the floor that can make you trip. What can I do in the bedroom?  Use night lights.  Make sure that you have a light by your bed that is easy to reach.  Do not use any sheets or blankets that are too big for your bed. They should not hang down onto the floor.  Have a firm chair that has side arms. You can use this for support while you get dressed.  Do not have throw rugs and other things on the floor that can make you trip. What can I do in the kitchen?  Clean up any spills right away.  Avoid walking on wet floors.  Keep items that you use a lot in easy-to-reach places.  If you need to  reach something above you, use a strong step stool that has a grab bar.  Keep electrical cords out of the way.  Do not use floor polish or wax that makes floors slippery. If you must use wax, use non-skid floor wax.  Do not have throw rugs and other things on the floor that can make you trip. What can I do with my stairs?  Do not leave any items on the stairs.  Make sure that there are handrails on both sides of the stairs and use them. Fix handrails that are broken or loose. Make sure that handrails are as long as the stairways.  Check any carpeting to make sure that it is firmly attached to the stairs. Fix any carpet that is loose or worn.  Avoid having throw rugs at the top or bottom of the stairs. If you do have throw rugs, attach them to the floor with carpet tape.  Make sure that you have a light switch at the top of the stairs and the bottom of the stairs. If you do not have them, ask  someone to add them for you. What else can I do to help prevent falls?  Wear shoes that: ? Do not have high heels. ? Have rubber bottoms. ? Are comfortable and fit you well. ? Are closed at the toe. Do not wear sandals.  If you use a stepladder: ? Make sure that it is fully opened. Do not climb a closed stepladder. ? Make sure that both sides of the stepladder are locked into place. ? Ask someone to hold it for you, if possible.  Clearly mark and make sure that you can see: ? Any grab bars or handrails. ? First and last steps. ? Where the edge of each step is.  Use tools that help you move around (mobility aids) if they are needed. These include: ? Canes. ? Walkers. ? Scooters. ? Crutches.  Turn on the lights when you go into a dark area. Replace any light bulbs as soon as they burn out.  Set up your furniture so you have a clear path. Avoid moving your furniture around.  If any of your floors are uneven, fix them.  If there are any pets around you, be aware of where they are.  Review your medicines with your doctor. Some medicines can make you feel dizzy. This can increase your chance of falling. Ask your doctor what other things that you can do to help prevent falls. This information is not intended to replace advice given to you by your health care provider. Make sure you discuss any questions you have with your health care provider. Document Released: 04/25/2009 Document Revised: 12/05/2015 Document Reviewed: 08/03/2014 Elsevier Interactive Patient Education  [employer].

## 2018-05-19 NOTE — Assessment & Plan Note (Signed)
S: On thyroid medication-. levothyroxine 75 mcg-encouraged him to move this to morning dose Lab Results  Component Value Date   TSH 5.46 (H) 05/03/2018  A/P: mild elevation of tsh- too early for recheck with him being on morning dose- check this next visit and continue present dose

## 2018-05-19 NOTE — Assessment & Plan Note (Signed)
S: controlled on amlodipine 5 mg, losartan 50 mg, Lasix 20 mg  Continued edema- some weeping form legs.  BP Readings from Last 3 Encounters:  05/19/18 110/60  05/13/18 138/72  05/03/18 134/72  A/P: We discussed blood pressure goal of <140/90. Continue current meds

## 2018-05-19 NOTE — Patient Instructions (Addendum)
Increase zoloft back to 200mg  (2 tablets of the 100mg  pill)  Increase lasix to 40mg  daily to see if that helps with the swelling.   See me back in 3-6 weeks so we can recheck potassium and swelling amounts  Bring all meds with you to next visit

## 2018-05-19 NOTE — Patient Outreach (Addendum)
[place] [facility]) Care Management  05/19/2018  Hunter [name] 1933/08/27 [id number]  TELEPHONE SCREENING Referral date: 05/09/18 Referral source: primary MD referral  Referral reason: medication management/ education  Insurance: united health care  Telephone call to Manuela Schwartz with patients primary MD office.  Patient in office for appointment with Manuela Schwartz. Manuela Schwartz states patient is extremely hard of hearing and has offered to communicate with patient for engagement to [facility] care management services. Manuela Schwartz and patient on phone line with RNCM. HIPAA verified with patient. RNCM discussed and offered [facility] care management services.  Manuela Schwartz states patient doesn't have a phone.  Manuela Schwartz states patient had adequate hearing impaired equipment at one time but does not have the service now.   Patient reports having his medication and taking medication as prescribed.  Manuela Schwartz states patient is in need of assistance with his medications. States patient was previous using pill packs but has had some confusion. Patient has a scheduled follow up appointment with his primary MD on 06/16/18 at 3:30pm.   Request patient needing assistance with pill box.  Per Manuela Schwartz with primary MD office patient and son were residing  together.  Son has recently moved out.   RNCM discussed and offered [facility] care management services. Patient verbally agreed.   ASSESSMENT:  Patient  hard of hearing and does not have a home and/ or cell phone.  Patient reports he would be available for a home visit on Friday May 27, 2018 at 11:00am  PLAN: RNCM will refer patient to social worker and pharmacist.   Quinn Plowman RN,BSN,CCM [facility] Telephonic  [phone]

## 2018-05-20 ENCOUNTER — Other Ambulatory Visit: Payer: Self-pay

## 2018-05-20 NOTE — Patient Outreach (Signed)
[place] [facility]) Care Management  05/20/2018  NINO AMANO March 10, 1934 [id number]  Referral for Mr. Farkas for medication management.  He is extremely hard of hearing and has no telephone.  At MD office visit, RN reports he states he will be home 11/15 at 11 am for a visit.  Plan: Home visit 11/15 at 11 am.  [name], [place] [phone]

## 2018-05-23 ENCOUNTER — Ambulatory Visit: Payer: Self-pay

## 2018-05-24 ENCOUNTER — Telehealth: Payer: Self-pay

## 2018-05-24 NOTE — Telephone Encounter (Signed)
fup to AWV on 05/19/2018 Hunter [name] agreed to an assessment by the Deaf and Hard of hearing for assistance with communication aids that may be helpful.  Left VM with my number for call back  [place]

## 2018-05-25 NOTE — Telephone Encounter (Signed)
Rec'd call back from Div of [place]  can be reached at [phone] or email [email or web address]  Tried to call but is out in the community. Sent email marked "secure" for start up of services with verbal permission of the patient once request seen in writing.   Wynetta Fines RN

## 2018-05-26 ENCOUNTER — Other Ambulatory Visit: Payer: Self-pay

## 2018-05-26 ENCOUNTER — Ambulatory Visit (INDEPENDENT_AMBULATORY_CARE_PROVIDER_SITE_OTHER): Payer: Medicare Other

## 2018-05-26 DIAGNOSIS — R609 Edema, unspecified: Secondary | ICD-10-CM | POA: Diagnosis not present

## 2018-05-26 DIAGNOSIS — R0602 Shortness of breath: Secondary | ICD-10-CM

## 2018-05-27 ENCOUNTER — Ambulatory Visit: Payer: Self-pay

## 2018-05-27 ENCOUNTER — Other Ambulatory Visit: Payer: Self-pay

## 2018-05-27 DIAGNOSIS — M1712 Unilateral primary osteoarthritis, left knee: Secondary | ICD-10-CM | POA: Diagnosis not present

## 2018-05-27 DIAGNOSIS — S72002D Fracture of unspecified part of neck of left femur, subsequent encounter for closed fracture with routine healing: Secondary | ICD-10-CM | POA: Diagnosis not present

## 2018-05-27 NOTE — Patient Outreach (Addendum)
[place] [facility]) Care Management  [place]   05/27/2018  Hunter [name] Nov 11, 1933 [id number]  Phone number [phone]  Reason for referral: medication management-difficult managing his medications  Referral source: PCP Current insurance:[employer]  PMHx:  hypertension, complete heart block, GERD,  hypothyroidism, neuropathy, CKD Stage III, benign prostatic hyperplasia, depression and hyperlipidemia.  Successful home visit with Mr. Hunter [name] accompanied by [employer], [employer] BSW.  HIPAA identifiers verified.   HPI:  Referral for Mr. Hunter [name] from his PCP due to concerns that he is not able to manage his medications.   Patient is hard of hearing and reportedly did not have a phone.  Upon arrival, patient reports that he has an appointment with his orthopedist at noon to evaluate his hip.    Objective: Lab Results  Component Value Date   CREATININE 1.19 05/03/2018   CREATININE 1.21 02/16/2018   CREATININE 1.06 11/21/2017    No results found for: HGBA1C  Lipid Panel     Component Value Date/Time   CHOL 179 02/16/2018 1213   TRIG 188.0 (H) 02/16/2018 1213   TRIG 128 06/01/2006 1144   HDL 28.40 (L) 02/16/2018 1213   CHOLHDL 6 02/16/2018 1213   VLDL 37.6 02/16/2018 1213   LDLCALC 113 (H) 02/16/2018 1213   LDLDIRECT 128.3 05/17/2012 0933    BP Readings from Last 3 Encounters:  05/19/18 110/60  05/19/18 110/60  05/13/18 138/72    No Active Allergies  Medications Reviewed Today    Reviewed by Dionne Milo, [facility] (Pharmacist) on 05/27/18 at 100  Med List Status: <None>  Medication Order Taking? Sig Documenting Provider Last Dose Status Informant  amLODipine (NORVASC) 5 MG tablet [id number] Yes Take 1 tablet (5 mg total) by mouth daily. [name], MD Taking Active   atorvastatin (LIPITOR) 20 MG tablet [id number] Yes Take 1 tablet (20 mg total) by mouth daily. [name], MD Taking Active   furosemide (LASIX) 40 MG tablet [id number] Yes  Take 1 tablet (40 mg total) by mouth daily. [name], MD Taking Active   levothyroxine (SYNTHROID, LEVOTHROID) 75 MCG tablet [id number] Yes Take 1 tablet (75 mcg total) by mouth every morning. [name], MD Taking Active   losartan (COZAAR) 50 MG tablet [id number] Yes TAKE 1 TABLET BY MOUTH EVERY DAY [name], MD Taking Active   sertraline (ZOLOFT) 100 MG tablet [id number] Yes Take 2 tablets (200 mg total) by mouth daily. [name], MD Taking Active           Assessment:  Drugs sorted by system:  Neurologic/Psychologic:sertraline  Cardiovascular: amlodipine, atorvastatin, furosemide, losartan,   Endocrine: levothyroxine  Medication Review Findings:  . Patient had his current prescriptions in his home.  I did not see any other prescription or OTC products lying around.  . All his prescriptions were filled on 05/07/18 and had an appropriate number of pills remaining in the bottles.  I did not get to review his medications with him or discuss any difficulties or concerns due to the limited time before his doctor's appointment.  Hunter [name] does have a phone and I have scheduled an outreach with him on Tuesday 11/19 at 10:00.   Plan: Outreach attempt on 11/19 at 10 am.  [name], [place] [phone]

## 2018-05-27 NOTE — Patient Outreach (Addendum)
[place] [facility]) Care Management  05/27/2018  Hunter [name] July 03, 1934 [id number]    Social work referral received on 05/20/18.  MD referral due to Hunter [name] being hearing impaired and needing adaptive equipment.  He reported not having a phone during his last MD appointment so a home visit was scheduled for today @ 11:00 AM. BSW and Hunter [name], Hunter [name] met with patient in his home today.  Upon arrival, Hunter [name] reported that he was scheduled for [name] orthopedic appointment at 12:00.  BSW had conversation with him about the need for adaptive equipment.  He reported that he previously had a caption telephone through [employer] but was no longer able to afford it.  He also reported that he currently does not have a functioning hearing aid.  Hunter [name] does have a land line telephone and BSW obtained this phone number.   BSW talked with Hunter [name] about services available through [employer] for the [employer].  He gave BSW permission to submit referral for services. BSW preferred to call and submit referral while there with Hunter [name] but he had to get to his orthopedic appointment.   BSW left voicemail message for Hunter [name], Hard of Hearing Specialist regarding referral.   BSW will follow up with Hunter [name] after connecting with Hunter [name].    Addendum: BSW received return call from [facility]. They can provide assistance with one hearing aid OR phone every six years is patient is within the income requirement.  They can also provide patient with alerting devices for things like door bell, alarm clock, etc.  If Hunter [name] is within the income requirements, he will need to have a hearing test conducted with one of the preferred providers.  Hunter [name] agreed to email a list of these providers to BSW.  The provider conducting the hearing test will complete a certification form if he meets the need and is [name] appropriate candidate.   BSW will contact Hunter [name] next week to inquire about income and proceed with process if appropriate.   Ronn Melena, BSW Social Worker [phone]

## 2018-05-30 ENCOUNTER — Telehealth: Payer: Self-pay | Admitting: *Deleted

## 2018-05-30 ENCOUNTER — Ambulatory Visit: Payer: Self-pay

## 2018-05-30 NOTE — Telephone Encounter (Signed)
-----   Message from Damian Leavell, RN sent at 05/27/2018 11:37 AM EST ----- [facility]

## 2018-05-31 ENCOUNTER — Ambulatory Visit: Payer: Self-pay

## 2018-05-31 ENCOUNTER — Other Ambulatory Visit: Payer: Self-pay

## 2018-05-31 NOTE — Patient Outreach (Signed)
[place] [facility]) Care Management  05/31/2018  ARAVIND CHRISMER Apr 18, 1934 [id number]  84 year old male referred to [facility].  [place] services requested for medication management.  PMHx includes, but not limited to, hypertension, complete heart block, GERD, hypothyroidism, neuropathy, CKD Stage III, benign hypertrophy of prostate, depression and hyperlipidemia.   Successful outreach to Mr. Mohiuddin.   HIPAA identifiers verified.   Medication Management: Mr. Festa is agreeable to compliance pill packaging with [facility], which is his current pharmacy.  Called [facility] and they state they can deliver his pill packs tomorrow.  They report that they have a health coach that will be following Mr. Dillenburg and that when they deliver his packs, they will have someone go to his home and ensure that he understands how to use the pill packs correctly.  Informed the Pharmacy that he is hard of hearing and that they may need to make repeated calls in order for him to hear the phone ringing.    Plan: Outreach to Mr. Deyo on Friday to see if he has any questions about his pill packs.   [name], PharmD Clinical Pharmacist [place] [phone]

## 2018-06-02 ENCOUNTER — Other Ambulatory Visit: Payer: Self-pay

## 2018-06-02 NOTE — Patient Outreach (Signed)
[place] [facility]) Care Management  06/02/2018  LATHANIEL LEGATE [birth year] [id number]   Follow up call to Mr. Millman regarding assistance with equipment for hearing impaired.  BSW attempted to talk with Mr. Piercey about assistance that can be provided by [place] Division of Services for the Deaf and Hard of Hearing but he had a difficult time understanding.  BSW scheduled home visit for 06/07/18 @ 11:00.  Ronn Melena, BSW Social Worker [phone]

## 2018-06-03 ENCOUNTER — Ambulatory Visit: Payer: Self-pay

## 2018-06-03 ENCOUNTER — Other Ambulatory Visit: Payer: Self-pay

## 2018-06-03 NOTE — Patient Outreach (Addendum)
[place] [facility]) Care Management  06/03/2018  Hunter [name] 1934/04/18 [id number]   Successful outreach to to Hunter [name].  HIPAA identifiers verified.   Hunter [name] reports that he has received this pill packs from [facility]. He states that he is satisfied with the pill packs and was able to describe to use them.  He had more difficulty hearing me today on the phone than during our previous conversation.  I have spoken with [facility] and they are going to send their driver back by Hunter [name]'s house today to ensure that he is using he packs correctly..  Plan: Follow up with Hunter [name] in two weeks.   [name], PharmD Clinical Pharmacist [place] [phone]  Addendum: Incoming call received from [facility] [place].  She verified that their delivery driver went to Hunter [name]'s house this afternoon and that Hunter [name] was using his pill packs correctly.    Plan: Follow up with Hunter [name] in 2 weeks.  [name], PharmD Clinical Pharmacist [place] [phone]

## 2018-06-06 ENCOUNTER — Encounter: Payer: Self-pay | Admitting: *Deleted

## 2018-06-07 ENCOUNTER — Other Ambulatory Visit: Payer: Self-pay

## 2018-06-07 NOTE — Patient Outreach (Signed)
[place] [facility]) Care Management  06/07/2018  Hunter [name] 02-03-1934 [id number]  Home visit with [name] today regarding social work referral for resources for hearing impaired.  BSW informed [name] that Division of Services for the Deaf and Hard of Hearing can provide one hearing aid and alerting devices every six years based on hearing loss and financial assessment.  BSW explained that a hearing test must occur first as part of the eligibility process.  BSW and [name] contacted [facility] in [place] to schedule this hearing test as they are on the preferred vendor list provided by [facility].  His hearing test is scheduled for 06/13/18 @ 12:00.  [name] has two hearing aids that no longer work.  BSW encouraged him to take them to the hearing test because [facility] [place] may be able to provide assistance with repair of these.  BSW informed [name] that he will be provided with the Audiogram and "Certfication Page" upon completion of the hearing test.  He is required to submit this documentation to [facility].  BSW will contact [name] on Monday morning to remind him of appointment, to take hearing aids with him, and to ensure he is given the proper documentation upon completion of the hearing test. When BSW arrived for home visit, his neighbor was outside and reported that [name] has had two recent falls.  BSW discussed this with [name] and he confirmed falling in a gravel parking lot while out shopping and falling at his mailbox.  BSW talked with him about the importance of utilizing his cane and/or walker.  BSW inquired about his next MD visit and he said it is scheduled for 06/16/18. BSW encouraged him to discuss recent falls with provider.  Note will be routed to Dr. Yong Channel.    Ronn Melena, BSW Social Worker [phone]

## 2018-06-07 NOTE — Telephone Encounter (Signed)
Letter mailed to patient on 06/06/18 by Orson Slick LPN.

## 2018-06-07 NOTE — Telephone Encounter (Signed)
fup regarding Hunter [name] from [facility] providing services via Div for the [facility]. Hunter [name] is in process of obtaining services per [name] note . Will close   Hunter Fines RN

## 2018-06-13 ENCOUNTER — Other Ambulatory Visit: Payer: Self-pay

## 2018-06-13 NOTE — Patient Outreach (Signed)
[place] [facility]) Care Management  06/13/2018  Hunter [name] [birth year] [id number]   Outgoing call to [name] to remind him of hearing test scheduled for today @ 12:00PM. [name] confirmed appointment and verbalized understanding of required documentation to request financial assistance from [place] of Services or the Deaf and Hard of Hearing.  BSW will follow up with him again before the end of the week to assist with next part of this process.  Ronn Melena, BSW Social Worker [phone]

## 2018-06-15 ENCOUNTER — Ambulatory Visit: Payer: Self-pay

## 2018-06-16 ENCOUNTER — Ambulatory Visit: Payer: Self-pay

## 2018-06-16 ENCOUNTER — Other Ambulatory Visit: Payer: Self-pay

## 2018-06-16 ENCOUNTER — Ambulatory Visit: Payer: Medicare Other | Admitting: Family Medicine

## 2018-06-16 NOTE — Patient Outreach (Signed)
[place] [facility]) Care Management  06/16/2018  Hunter [name] May 11, 1934 [id number]   BSW contacted Rod at [place] or the Deaf and Hard of Hearing regarding financial assessment for assistance with purchase of hearing aid.  The next available appointment is on 06/21/18.  BSW attempted to contact Hunter [name] to inform him of this.  BSW called multiple times and it went straight to voicemail each time; left voicemail message.  BSW called Rod back and requested another appointment date due to being unable to contact Hunter [name] for confirmation.  Appointment was scheduled for 08/23/18 @ 5:59 at [facility].  A letter will be mailed to Hunter [name] and BSW with appointment information and list of  documentation that needs to be submitted that day to determine eligibility.  BSW will attempt to reachl Hunter [name] again within four business days.    Ronn Melena, BSW Social Worker [phone]

## 2018-06-16 NOTE — Patient Outreach (Signed)
[place] [facility]) Care Management  [place]  06/16/2018  KAEVON COTTA 1933-11-22 [id number]  Unsuccessful telephone call attempt # 1 to Mr. Dase to follow up on his compliance pill packs. HIPAA compliant voicemail left requesting a return call.  Plan:  I will make another outreach attempt to patient within 3-4 business days.  [name], PharmD Clinical Pharmacist [place] [phone]

## 2018-06-20 ENCOUNTER — Ambulatory Visit: Payer: Self-pay

## 2018-06-20 ENCOUNTER — Other Ambulatory Visit: Payer: Self-pay

## 2018-06-20 NOTE — Patient Outreach (Signed)
[place] [facility]) Care Management  06/20/2018  Hunter [name] December 20, 1933 [id number]   Second attempt to contact [name] regarding financial assessment scheduled with  Division of Services for the Deaf and Hard of Hearing on 08/23/18 @ 9:30.  Will attempt to reach again within four business days.  Unsuccessful outreach letter mailed.   Ronn Melena, BSW Social Worker [phone]

## 2018-06-22 ENCOUNTER — Ambulatory Visit: Payer: Self-pay

## 2018-06-22 ENCOUNTER — Other Ambulatory Visit: Payer: Self-pay

## 2018-06-22 NOTE — Patient Outreach (Signed)
[place] [facility]) Care Management  06/22/2018  CHRISTOPHR CALIX 1933-11-22 [id number]  Successful outreach to Mr. Francis.  HIPAA identifiers verified.  Mr. Knope states that compliance pill packaging is working well for him.  He reports that his stools have been loose from the docusate in the pill packs and that he only has one sertraline 100 mg in his packs.  He reports that he has been supplementing with a bottle of sertraline that he still had at this house to make his 200 mg dose.  He also requested that [facility] add a ferrous sulfate table to his pill pack.  Outreach call placed to [employer].  Spoke with Cassie and I requested that they change his docusate sodium to 100 mg every morning (from 200 mg twice daily).  She verified that they had filled the "old" sertraline dose of 100 mg daily in his packs.  She deactivated that prescription and has the correct 200 mg dose daily going forward.  Patient is aware that the correct 200 mg dose will be in his pill packs he receives next week.  [facility] will add a ferrous sulfate 325 mg daily to his packs at patient's request.  Informed Mr. Parlee that if he ever has questions about his pill packs that he should call [facility] and ask to speak with their pharmacist.  He verbalized understanding.   Mr. Morino states that he has no further medication questions or concerns at this time.  Informed him that I will close his [place] case.  Patient has my phone number in case he needs to contact me in the future.   Plan: Route discipline closure letter to PCP, Dr. Yong Channel.   [name], PharmD Clinical Pharmacist [place] [phone]

## 2018-06-23 ENCOUNTER — Other Ambulatory Visit: Payer: Self-pay

## 2018-06-23 NOTE — Patient Outreach (Signed)
[place] [facility]) Care Management  06/23/2018  DARYLL SPISAK Jul 28, 1933 [id number]   Successful outreach call to Mr. Schliep to follow up on assistance with purchasing hearing aid.  Mr. Ishaq now has a caption phone and was able to hear much better during this call than previous calls.  Mr. Mian confirmed that he completed a hearing test on 06/13/18.  He also confirmed receipt of the letter from Division of Services for the Deaf and Hard of Hearing regarding his appointment on 08/23/18.  He verbalized understanding of documentation that he needs to take to this appointment.  BSW will give him a reminder call prior to this appointment.   Ronn Melena, BSW Social Worker [phone]

## 2018-07-19 ENCOUNTER — Ambulatory Visit: Payer: Medicare Other | Admitting: Family Medicine

## 2018-07-19 DIAGNOSIS — Z0289 Encounter for other administrative examinations: Secondary | ICD-10-CM

## 2018-07-20 ENCOUNTER — Encounter: Payer: Self-pay | Admitting: Family Medicine

## 2018-08-15 ENCOUNTER — Ambulatory Visit: Payer: Medicare Other

## 2018-08-17 ENCOUNTER — Ambulatory Visit (INDEPENDENT_AMBULATORY_CARE_PROVIDER_SITE_OTHER): Payer: Medicare Other

## 2018-08-17 DIAGNOSIS — I442 Atrioventricular block, complete: Secondary | ICD-10-CM

## 2018-08-18 ENCOUNTER — Ambulatory Visit: Payer: Self-pay

## 2018-08-19 ENCOUNTER — Ambulatory Visit: Payer: Self-pay

## 2018-08-19 ENCOUNTER — Other Ambulatory Visit: Payer: Self-pay

## 2018-08-19 DIAGNOSIS — M1712 Unilateral primary osteoarthritis, left knee: Secondary | ICD-10-CM | POA: Diagnosis not present

## 2018-08-19 DIAGNOSIS — S72002D Fracture of unspecified part of neck of left femur, subsequent encounter for closed fracture with routine healing: Secondary | ICD-10-CM | POA: Diagnosis not present

## 2018-08-19 LAB — CUP PACEART REMOTE DEVICE CHECK
Battery Remaining Longevity: 106 mo
Battery Remaining Percentage: 95.5 %
Battery Voltage: 2.98 V
Brady Statistic AP VP Percent: 33 %
Brady Statistic AP VS Percent: 1 %
Brady Statistic AS VP Percent: 65 %
Brady Statistic AS VS Percent: 1 %
Brady Statistic RA Percent Paced: 31 %
Brady Statistic RV Percent Paced: 98 %
Date Time Interrogation Session: 20200205160530
Implantable Lead Implant Date: 20180730
Implantable Lead Implant Date: 20180730
Implantable Lead Location: 753859
Implantable Lead Location: 753860
Implantable Lead Model: 5076
Implantable Lead Model: 5076
Implantable Pulse Generator Implant Date: 20180730
Lead Channel Impedance Value: 450 Ohm
Lead Channel Impedance Value: 490 Ohm
Lead Channel Pacing Threshold Amplitude: 0.5 V
Lead Channel Pacing Threshold Amplitude: 0.75 V
Lead Channel Pacing Threshold Pulse Width: 0.5 ms
Lead Channel Pacing Threshold Pulse Width: 0.5 ms
Lead Channel Sensing Intrinsic Amplitude: 1.2 mV
Lead Channel Sensing Intrinsic Amplitude: 11.1 mV
Lead Channel Setting Pacing Amplitude: 2 V
Lead Channel Setting Pacing Amplitude: 2.5 V
Lead Channel Setting Pacing Pulse Width: 0.5 ms
Lead Channel Setting Sensing Sensitivity: 4 mV
Pulse Gen Model: 2272
Pulse Gen Serial Number: 8930553

## 2018-08-19 NOTE — Patient Outreach (Signed)
[place] [facility]) Care Management  08/19/2018  MICAEL BARB [birth year] [id number]   Successful outreach to Mr. Kobus to remind him of financial assessment with [place] Division of Services for the Deaf and Hard of Hearing on 08/23/18 @ 9:30 AM.  Mr.Schara confirmed that he has all the required documentation gathered to bring to appointment to determine eligibility for financial assistance with purchase of hearing aid.  BSW is planning to attend assessment on 08/23/18.    Ronn Melena, BSW Social Worker [phone]

## 2018-08-23 ENCOUNTER — Other Ambulatory Visit: Payer: Self-pay

## 2018-08-23 NOTE — Patient Outreach (Signed)
[place] [facility]) Care Management  08/23/2018  NIKLAUS MAMARIL 08/29/1933 [id number]    BSW attended meeting with [place] Division of Services for the Deaf and Hard of Hearing with Mr. Grassi.  Mr Knipple is applying for a free hearing aid.  Mr. Hidrogo submitted all necessary documentation for application to be considered except proof of pension that he receives each month.    Rosalee Kaufman, Artist, informed Mr. Jafri that his application will be on hold until she receives this documentation.  She provided him with her mailing address and encouraged him to send this to her as soon as possible.  At the end of the meeting Mr. Baugh was able to verbalize to BSW what needs to be submitted and how he can go about obtaining this document.  Once all documentation is submitted, his application will be submitted to the state and he should receive a letter within the next 8-12 weeks informing him of decision.  BSW will follow up with him next week to ensure he was able to request proof of pension and send it to the division.  Ronn Melena, BSW Social Worker [phone]

## 2018-08-25 DIAGNOSIS — M25552 Pain in left hip: Secondary | ICD-10-CM | POA: Diagnosis not present

## 2018-08-25 DIAGNOSIS — M6281 Muscle weakness (generalized): Secondary | ICD-10-CM | POA: Diagnosis not present

## 2018-08-26 NOTE — Progress Notes (Signed)
Remote pacemaker transmission.   

## 2018-08-29 ENCOUNTER — Other Ambulatory Visit: Payer: Self-pay

## 2018-08-29 NOTE — Patient Outreach (Signed)
[place] [facility]) Care Management  08/29/2018  Hunter [name] Dec 20, 1933 [id number]   Follow up call to [name] regarding application for [place] Division of Services for the Deaf and Hard of Hearing.  BSW attended meeting with [name] and staff from the division last week.  His application is currently "on hold" because he did not have proof of pension that he receives.  [name] reported that he has been looking for documentation of this pension and believes that he will be able to locate it in his home.  BSW encouraged him to contact the agency that provides the pension and request that they send a statement versus having to search through boxes in his home. [name] said that he will continue to look for it and will contact the agency about sending a statement if he cannot locate it.  BSW will follow up with him again within the next two weeks.  Ronn Melena, BSW Social Worker [phone]

## 2018-08-30 DIAGNOSIS — M6281 Muscle weakness (generalized): Secondary | ICD-10-CM | POA: Diagnosis not present

## 2018-08-30 DIAGNOSIS — M25552 Pain in left hip: Secondary | ICD-10-CM | POA: Diagnosis not present

## 2018-09-01 DIAGNOSIS — M6281 Muscle weakness (generalized): Secondary | ICD-10-CM | POA: Diagnosis not present

## 2018-09-01 DIAGNOSIS — M25552 Pain in left hip: Secondary | ICD-10-CM | POA: Diagnosis not present

## 2018-09-02 ENCOUNTER — Other Ambulatory Visit: Payer: Self-pay

## 2018-09-02 NOTE — Patient Outreach (Signed)
[place] [name]) Care Management  09/02/2018  TISHAWN FRIEDHOFF Sep 20, 1933 [id number]  Incoming call received from [place] at [place].  She reports that their driver went to deliver Mr. Etienne's pill packs on 2/19 and patient stated that he didn't need them.  Driver reported that he had several unused pill packs, so it seems he has not been compliant with his medications.  Cassie states that they have staff that can go to his house and take back unused pill packs, re-educate and try to determine why he has gotten off schedule.   Outreach call placed to Mr. Verma.  His home phone  [phone]) had a full mailbox and I was unable to leave a message, but I did leave a HIPAA compliant message at the number [phone]) requesting a return call.   Call placed to Cassie at [place].  Requested that she send a pharmacy member out to Mr. Kopper's, since I could not get in touch with him.  Of note, he does have hearing issues, so I am not sure that he can always hear the phone ring.  Requested that she update me after their home visit.  Plan: Outreach to Mr. Borger next week.   Route note to PCP, Dr. Yong Channel.   [name], PharmD Clinical Pharmacist [place] [phone]

## 2018-09-05 ENCOUNTER — Other Ambulatory Visit: Payer: Self-pay

## 2018-09-05 NOTE — Patient Outreach (Signed)
[place] [facility]) Care Management  09/05/2018  KEVION FATHEREE 1933-11-12 [id number]   Incoming call received from [place] at [place]. She reports that they sent a staff member to Mr Hohler's house today to look at his medications.  Staff reports that Mr. Yagi states that he is taking his medications correctly and not skipping any doses.  Cassie reports that he has about 2 weeks worth of unused medications dating back through mid January.  The pharmacy staff took all the extra pill packs from his house and his pill packs are now up to date as of today. Staff reports a strong urine smell in the home and questioned Mr. Swader.  He reports that he is no longer able to control his urine and has started wearing diapers.  He reports that he is very chaffed.    Unsuccessful outreach attempt to Mr. Turski.  Left HIPAA compliant voice message requesting a return call.   Plan: Will route note to Dr. Yong Channel.  [name], PharmD Clinical Pharmacist [place] [phone]

## 2018-09-05 NOTE — Progress Notes (Signed)
I would have him try just plain vaseline for chaffing at first- can be pretty effective. I am certainly happy to see him for follow up visit- looks like he has a visit with you on the 27th?

## 2018-09-06 DIAGNOSIS — M25552 Pain in left hip: Secondary | ICD-10-CM | POA: Diagnosis not present

## 2018-09-06 DIAGNOSIS — M6281 Muscle weakness (generalized): Secondary | ICD-10-CM | POA: Diagnosis not present

## 2018-09-08 ENCOUNTER — Ambulatory Visit: Payer: Self-pay

## 2018-09-08 ENCOUNTER — Other Ambulatory Visit: Payer: Self-pay

## 2018-09-08 DIAGNOSIS — M25552 Pain in left hip: Secondary | ICD-10-CM | POA: Diagnosis not present

## 2018-09-08 DIAGNOSIS — M6281 Muscle weakness (generalized): Secondary | ICD-10-CM | POA: Diagnosis not present

## 2018-09-08 NOTE — Patient Outreach (Addendum)
[place] [facility]) Care Management  [place]  09/08/2018  Hunter [name] [birth year] [id number]  Reason for call: care coordination follow up from [place] telephone call attempt # 3 to patient.   HIPAA compliant voicemail left requesting a return call on mobile line.  His other number had a full mailbox and I was unable to leave a message.   Outreach to Hunter [name] and spoke to Hunter [name].  Informed her of Dr. Ansel Bong recommendation of Vasoline for chaffing.  She states that they will deliver some out to his house today .  Requested that they also check to see if he is doing better taking his medications and let him know that he needs to call his PCP if his chaffing gets worse.  Also, asked that they make sure he has his phone ringer on and that he is checking his voice message.     Plan:  I will follow-up on 10th business day from opening case.  If no response from patient at this time, I will close [facility] case.  Hunter [name], PharmD Clinical Pharmacist [place] [phone]

## 2018-09-12 ENCOUNTER — Other Ambulatory Visit: Payer: Self-pay

## 2018-09-12 ENCOUNTER — Ambulatory Visit: Payer: Self-pay

## 2018-09-12 NOTE — Patient Outreach (Signed)
[place] [facility]) Care Management  09/12/2018  BYRNE CAPEK 1934/05/22 [id number]   Unsuccessful outreach to patient to follow up on status of referral to [place] Division of Services for the Deaf and Hard of Hearing.  BSW called three times in a row due to his hearing impairment.  Patient did not answer and voicemail was full.  BSW will attempt to reach again within four business days.  Ronn Melena, BSW Social Worker [phone]

## 2018-09-13 DIAGNOSIS — M25552 Pain in left hip: Secondary | ICD-10-CM | POA: Diagnosis not present

## 2018-09-13 DIAGNOSIS — M6281 Muscle weakness (generalized): Secondary | ICD-10-CM | POA: Diagnosis not present

## 2018-09-15 ENCOUNTER — Other Ambulatory Visit: Payer: Self-pay

## 2018-09-15 ENCOUNTER — Ambulatory Visit: Payer: Self-pay

## 2018-09-15 DIAGNOSIS — M25552 Pain in left hip: Secondary | ICD-10-CM | POA: Diagnosis not present

## 2018-09-15 DIAGNOSIS — M6281 Muscle weakness (generalized): Secondary | ICD-10-CM | POA: Diagnosis not present

## 2018-09-15 NOTE — Patient Outreach (Signed)
[place] [facility]) Care Management [place]  09/15/2018  Hunter [name] [birth year] [id number]  Reason for referral: medication managemennt [facility] pharmacy case is being closed due to the following reasons:  We have been unable to establish and/or maintain contact with the patient.  Patient has been provided [facility] CM contact information if assistance needed in the future.    Thank you for allowing [facility] pharmacy to be involved in this patient's care.    [name], PharmD Clinical Pharmacist [place] [phone]

## 2018-09-15 NOTE — Patient Outreach (Signed)
[place] [facility]) Care Management  09/15/2018  Hunter [name] 1933/09/05 [id number]    Second unsuccessful outreach to patient to follow up on status of referral to  Division of Services for the Deaf and Hard of Hearing.  BSW called three times in a row due to his hearing impairment.  Patient did not answer and voicemail was full.  BSW will attempt to reach again within four business days. Unsuccessful outreach letter mailed.   Ronn Melena, BSW Social Worker [phone]

## 2018-09-16 ENCOUNTER — Ambulatory Visit: Payer: Self-pay

## 2018-09-16 ENCOUNTER — Other Ambulatory Visit: Payer: Self-pay

## 2018-09-16 NOTE — Patient Outreach (Addendum)
[place] [facility]) Care Management  09/16/2018  NOEMI ISHMAEL [birth year] [id number]   Third unsuccessful outreach to patient to follow up on status of referral to [place] Division of Services for the Deaf and Hard of Hearing. BSW called three times in a row due to his hearing impairment. Patient did not answer and voicemail was full.  Unsuccessful outreach letter mailed on 09/15/18.  Ronn Melena, BSW Social Worker [phone]

## 2018-09-20 DIAGNOSIS — M6281 Muscle weakness (generalized): Secondary | ICD-10-CM | POA: Diagnosis not present

## 2018-09-20 DIAGNOSIS — M25552 Pain in left hip: Secondary | ICD-10-CM | POA: Diagnosis not present

## 2018-09-23 ENCOUNTER — Other Ambulatory Visit: Payer: Self-pay

## 2018-09-23 NOTE — Patient Outreach (Signed)
[place] [facility]) Care Management  09/23/2018  ELIYAHU BILLE 1933-12-25 [id number]   [facility] BSW closing case due to inability to maintain contact.  Ronn Melena, BSW Social Worker [phone]

## 2018-09-27 DIAGNOSIS — M25552 Pain in left hip: Secondary | ICD-10-CM | POA: Diagnosis not present

## 2018-09-27 DIAGNOSIS — M6281 Muscle weakness (generalized): Secondary | ICD-10-CM | POA: Diagnosis not present

## 2018-09-29 DIAGNOSIS — M6281 Muscle weakness (generalized): Secondary | ICD-10-CM | POA: Diagnosis not present

## 2018-09-29 DIAGNOSIS — M25552 Pain in left hip: Secondary | ICD-10-CM | POA: Diagnosis not present

## 2018-10-04 DIAGNOSIS — M25552 Pain in left hip: Secondary | ICD-10-CM | POA: Diagnosis not present

## 2018-10-04 DIAGNOSIS — M6281 Muscle weakness (generalized): Secondary | ICD-10-CM | POA: Diagnosis not present

## 2018-10-06 DIAGNOSIS — M25552 Pain in left hip: Secondary | ICD-10-CM | POA: Diagnosis not present

## 2018-10-06 DIAGNOSIS — M6281 Muscle weakness (generalized): Secondary | ICD-10-CM | POA: Diagnosis not present

## 2018-10-11 ENCOUNTER — Other Ambulatory Visit: Payer: Self-pay | Admitting: Family Medicine

## 2018-10-12 ENCOUNTER — Telehealth: Payer: Self-pay | Admitting: Family Medicine

## 2018-10-12 NOTE — Telephone Encounter (Signed)
We can try for 11 20 tomorrow

## 2018-10-12 NOTE — Telephone Encounter (Signed)
I called Hunter [name] back to offer a Webex visit to discuss issue further with Dr. Yong Channel.  Hunter [name] informed me that he has had Hunter [name] for the past 2 months for walking issues.  Hunter [name] explains that he has bladder concerns and concerns about swelling in ankles and feet.   Hunter [name] does not have access to Internet or any smart devices at home.  I asked Hunter [name] if he had any family members that may have a smart phone, which he does not.  Hunter [name] has not had any fever, cough, chills or travel nor been around anyone who has.  I informed Hunter [name] that I would send a message to Dr. Yong Channel and get his feedback on what to do about Hunter [name]'s visit and call back.   Please advise.

## 2018-10-12 NOTE — Telephone Encounter (Signed)
Copied from [place] [phone]. Topic: Appointment Scheduling - Scheduling Inquiry for Clinic >> Oct 11, 2018  4:04 PM Valla Leaver wrote: Reason for CRM: Patient needs an appointment with Dr. Yong Channel for walking/balance concerns.

## 2018-10-12 NOTE — Telephone Encounter (Signed)
I called pt back and left a detailed message informing him of scheduled phone visit for tomorrow at 11:40am.

## 2018-10-13 ENCOUNTER — Ambulatory Visit (INDEPENDENT_AMBULATORY_CARE_PROVIDER_SITE_OTHER): Payer: Medicare Other | Admitting: Family Medicine

## 2018-10-13 ENCOUNTER — Encounter: Payer: Self-pay | Admitting: Family Medicine

## 2018-10-13 DIAGNOSIS — I5032 Chronic diastolic (congestive) heart failure: Secondary | ICD-10-CM

## 2018-10-13 DIAGNOSIS — R531 Weakness: Secondary | ICD-10-CM | POA: Diagnosis not present

## 2018-10-13 DIAGNOSIS — I878 Other specified disorders of veins: Secondary | ICD-10-CM | POA: Diagnosis not present

## 2018-10-13 NOTE — Progress Notes (Signed)
Phone [phone]   Subjective:  Virtual visit via phonenote  Our team/I connected with Hunter [name] on 10/13/18 at 11:40 AM EDT by phone (patient did not have equipment for webex) and verified that I am speaking with the correct person using two identifiers.  Location patient: Home-[place] Location provider: [facility], office Persons participating in the virtual visit: patient  Time on call: 34 minutes Majority of time spent in Counseling about importance of BP monitoring, possible heart failure  Our team/I discussed the limitations of evaluation and management by telemedicine and the availability of in person appointments. In light of current covid-19 pandemic, patient also understands that we are trying to protect them by minimizing in office contact if at all possible.  The patient expressed consent for telemedicine visit and agreed to proceed.   ROS-  complains of some shortness of breath with exertion, edema slightly worse over last week. Denies worsened hearing  Past Medical History-  Patient Active Problem List   Diagnosis Date Noted  . Complete heart block ([place]) 02/08/2017    Priority: High  . Benign hypertrophy of prostate 05/10/2015    Priority: High  . (HFpEF) heart failure with preserved ejection fraction ([place]) 12/06/2013    Priority: High  . 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 Thrombocytopenia ([place]) 05/23/2012    Priority: Medium  . Neuropathy ([place]) 11/02/2011    Priority: Medium  . CKD (chronic kidney disease), stage III ([place]) 05/27/2011    Priority: Medium  . Memory loss 04/08/2011    Priority: Medium  . Hypothyroidism 08/15/2008    Priority: Medium  . ANEMIA 08/15/2008    Priority: Medium  . Venous stasis 08/15/2008    Priority: Medium  . Hyperlipidemia 02/02/2007    Priority: Medium  . Major depression in partial remission ([place]) 02/02/2007    Priority: Medium  . Essential hypertension 02/02/2007    Priority:  Medium  . Skin lesion of left lower extremity 03/26/2014    Priority: Low  . Stasis eczema 03/26/2014    Priority: Low  . History of skin cancer 03/26/2014    Priority: Low  . Syncope 08/14/2013    Priority: Low  . Low back pain 04/08/2011    Priority: Low  . LBBB (left bundle branch block) 05/12/2010    Priority: Low  . GAIT IMBALANCE 04/23/2010    Priority: Low  . SPINAL STENOSIS, CERVICAL 03/21/2010    Priority: Low  . Constipation 03/19/2010    Priority: Low  . Urinary frequency 09/05/2009    Priority: Low  . Vitamin D deficiency 08/15/2008    Priority: Low  . Allergic rhinitis 02/02/2007    Priority: Low  . GERD 02/02/2007    Priority: Low  . Generalized weakness 10/13/2018    Medications- reviewed and updated Current Outpatient Medications  Medication Sig Dispense Refill  . amLODipine (NORVASC) 5 MG tablet Take 1 tablet (5 mg total) by mouth daily. 90 tablet 1  . atorvastatin (LIPITOR) 20 MG tablet Take 1 tablet (20 mg total) by mouth daily. 90 tablet 3  . Calcium Carbonate-Vitamin D (OYSTER SHELL CALCIUM 500 + D PO) Take 500 mg by mouth daily.    [place] docusate sodium (COLACE) 100 MG capsule Take 100 mg by mouth daily.     . ferrous sulfate 325 (65 FE) MG tablet Take 325 mg by mouth daily with breakfast.    . furosemide (LASIX) 40 MG tablet Take 1 tablet (40  mg total) by mouth daily. 90 tablet 1  . levothyroxine (SYNTHROID, LEVOTHROID) 75 MCG tablet Take 1 tablet (75 mcg total) by mouth every morning. 90 tablet 1  . losartan (COZAAR) 50 MG tablet TAKE 1 TABLET BY MOUTH EVERY DAY 90 tablet 1  . sertraline (ZOLOFT) 100 MG tablet TAKE TWO TABLETS BY MOUTH EVERY DAY 180 tablet 0   No current facility-administered medications for this visit.      Objective:  Ht 5\' 11"  (1.803 m)   BMI 32.50 kg/m  Appears to be winded at times when moving around house No audible wheeze Hard or hearing- have to speak very loudly for patient Can be tangential    Assessment and Plan    Other notes: 1. Dr. Gaynelle Arabian for frequent urination-he is going to contact urology and try to get in touch with a new provider  (HFpEF) heart failure with preserved ejection fraction ([place]) S: on echo late 2019-EF 03%, grade 1 diastolic dysfunction   Edema has increased in the last week. Weight is stable. He is more short of breath over the last week along with the swelling.   Patient states he is compliant with lasix 40mg .  Also taking a 2nd dose at same time so taking 60mg  once symptoms worsened- that seems to have helped some  Also using compression stockings due to history of venous stasis skin changes A/P: I am going to change this from shortness of breath to heart failure with preserved ejection fraction based on last echo and cardiology report that patient did not need further cardiac work-up for potential CAD.  Patient with continued dependence on Lasix.  Would like to update labs but not ideal to have him in the office right now with covid-19. -Given changing this diagnosis I think we need to try to reduce amlodipine if possible.  Patient is going to get a home cuff -I asked him to have a family member or friend get this form and drop it off due to him being high risk for COVID-19 -He will let me know what his blood pressure is tomorrow and if controlled- would consider reducing amlodipine to 2.5 mg. -Hesitant to add hydrochlorothiazide to Lasix 60 mg  Generalized weakness S: Appears patient self-referred toRehab center- had been going lately. working on strengthening knees and legs. Despite rehab - has still had to use walker, cane, or wheelchair. He just feels less stable despite the PT- was going tues/thursday  For  hour sessions. I had referred him to home health 02/2018.    PT states he had increased strength. Broke hip last April and has been a hard road to recovery says.  A/P: Patient tells me that physical therapy thought hearing aids may help him with balance- I am not sure  patient heard this correctly. Has new hearing aids coming in within a few days-he needs these regardless due to difficulty hearing. -I recommended he continue with therapy but he states he was told no additional benefit from extra sessions - He is going to continue to use a cane or walker-fortunately has not had recent falls. - [facility] close case due to difficulty with communication with patient - consider in person evaluation when safer from covid 19 perspective  3 months would be reasonable if able to see each other in person  Future Appointments  Date Time Provider [place]  11/23/2018  8:55 AM [facility] DEVICE REMOTES [facility]  05/19/2019  9:15 AM Allred, Jeneen Rinks, MD [facility]   Lab/Order associations:  Venous stasis  Chronic heart failure with preserved ejection fraction (HCC)  Generalized weakness  Return precautions advised.  Garret Reddish, MD

## 2018-10-13 NOTE — Assessment & Plan Note (Signed)
S: on echo late 2019-EF 97%, grade 1 diastolic dysfunction   Edema has increased in the last week. Weight is stable. He is more short of breath over the last week along with the swelling.   Patient states he is compliant with lasix 40mg .  Also taking a 2nd dose at same time so taking 60mg  once symptoms worsened- that seems to have helped some  Also using compression stockings due to history of venous stasis skin changes A/P: I am going to change this from shortness of breath to heart failure with preserved ejection fraction based on last echo and cardiology report that patient did not need further cardiac work-up for potential CAD.  Patient with continued dependence on Lasix.  Would like to update labs but not ideal to have him in the office right now with covid-19. -Given changing this diagnosis I think we need to try to reduce amlodipine if possible.  Patient is going to get a home cuff -I asked him to have a family member or friend get this form and drop it off due to him being high risk for COVID-19 -He will let me know what his blood pressure is tomorrow and if controlled- would consider reducing amlodipine to 2.5 mg. -Hesitant to add hydrochlorothiazide to Lasix 60 mg

## 2018-10-13 NOTE — Assessment & Plan Note (Signed)
S: Appears patient self-referred toRehab center- had been going lately. working on strengthening knees and legs. Despite rehab - has still had to use walker, cane, or wheelchair. He just feels less stable despite the PT- was going tues/thursday  For  hour sessions. I had referred him to home health 02/2018.    PT states he had increased strength. Broke hip last April and has been a hard road to recovery says.  A/P: Patient tells me that physical therapy thought hearing aids may help him with balance- I am not sure patient heard this correctly. Has new hearing aids coming in within a few days-he needs these regardless due to difficulty hearing. -I recommended he continue with therapy but he states he was told no additional benefit from extra sessions - He is going to continue to use a cane or walker-fortunately has not had recent falls. - [facility] close case due to difficulty with communication with patient - consider in person evaluation when safer from covid 19 perspective

## 2018-10-13 NOTE — Patient Instructions (Addendum)
Health Maintenance Due  Topic Date Due  . TETANUS/TDAP  08/15/2018

## 2018-10-14 NOTE — Telephone Encounter (Signed)
See note  Copied from [place] [phone]. Topic: General - Other >> Oct 14, 2018  3:42 PM Margot Ables wrote: Reason for CRM: pt called stating he was supposed to report BP and temp to Dr. Yong Channel but wasn't able to get out and does not have the info. Pt ended the call after stating the msg.

## 2018-10-17 NOTE — Telephone Encounter (Signed)
Left detailed message for pt to return phone call.

## 2018-10-18 NOTE — Telephone Encounter (Signed)
Pt has had a phone visit w/Dr. Yong Channel, 10/13/18.

## 2018-11-09 ENCOUNTER — Other Ambulatory Visit: Payer: Self-pay | Admitting: Family Medicine

## 2018-11-23 ENCOUNTER — Ambulatory Visit (INDEPENDENT_AMBULATORY_CARE_PROVIDER_SITE_OTHER): Payer: Medicare Other | Admitting: *Deleted

## 2018-11-23 ENCOUNTER — Other Ambulatory Visit: Payer: Self-pay

## 2018-11-23 DIAGNOSIS — I10 Essential (primary) hypertension: Secondary | ICD-10-CM

## 2018-11-23 DIAGNOSIS — I442 Atrioventricular block, complete: Secondary | ICD-10-CM

## 2018-11-24 ENCOUNTER — Telehealth: Payer: Self-pay

## 2018-11-24 NOTE — Telephone Encounter (Signed)
Unable to leave a message for patient to remind of missed remote transmission.  

## 2018-11-24 NOTE — Telephone Encounter (Signed)
Pt been experiencing some shortness of breath and off balanced. It feel like he can fall easily. Pt states he been feeling like this since he fell last year. He is trying to send a transmission but the monitor is not working. I gave him the number to [facility] support to get additional help.

## 2018-11-25 LAB — CUP PACEART REMOTE DEVICE CHECK
Date Time Interrogation Session: 20200515194739
Implantable Lead Implant Date: 20180730
Implantable Lead Implant Date: 20180730
Implantable Lead Location: 753859
Implantable Lead Location: 753860
Implantable Lead Model: 5076
Implantable Lead Model: 5076
Implantable Pulse Generator Implant Date: 20180730
Pulse Gen Model: 2272
Pulse Gen Serial Number: 8930553

## 2018-11-25 NOTE — Telephone Encounter (Signed)
LMOVM for pt to call the device clinic. I called to follow up to see if pt called [employer] support to get his monitor fix.

## 2018-12-08 NOTE — Telephone Encounter (Signed)
I left the pt a message to call the device clinic. I was trying to see if the pt has called tech support about his home remote monitor.

## 2018-12-12 NOTE — Progress Notes (Signed)
Remote pacemaker transmission.   

## 2018-12-13 ENCOUNTER — Other Ambulatory Visit: Payer: Self-pay

## 2018-12-13 ENCOUNTER — Telehealth: Payer: Self-pay | Admitting: Family Medicine

## 2018-12-13 MED ORDER — FUROSEMIDE 40 MG PO TABS: 40.0000 mg | ORAL_TABLET | Freq: Every day | ORAL | 1 refills | Status: DC

## 2018-12-13 NOTE — Telephone Encounter (Signed)
Patient called to check status.

## 2018-12-13 NOTE — Telephone Encounter (Signed)
Rx has been refilled. Pt notified via vm.

## 2018-12-13 NOTE — Telephone Encounter (Signed)
See note

## 2018-12-13 NOTE — Telephone Encounter (Signed)
Copied from [place] [id number]. Topic: Quick Communication - Rx Refill/Question >> Dec 13, 2018 12:01 PM Pauline Good wrote: Medication: furosemide  Has the patient contacted their pharmacy?  (Agent: If no, request that the patient contact the pharmacy for the refill.) Cassie from [facility] need clarication on the dosage the pt is suppose to be taken (Agent: If yes, when and what did the pharmacy advise?)  Preferred Pharmacy (with phone number or street name):[facility]   Agent: Please be advised that RX refills may take up to 3 business days. We ask that you follow-up with your pharmacy.

## 2018-12-20 NOTE — Telephone Encounter (Signed)
Sent a letter with instructions

## 2018-12-22 ENCOUNTER — Telehealth: Payer: Self-pay

## 2018-12-22 NOTE — Telephone Encounter (Signed)
Copied from [place] [phone]. Topic: General - Other >> Dec 22, 2018  2:23 PM Leward Quan A wrote: Reason for CRM: Pryor Montes with [facility] called to clarify patients Rx for furosemide (LASIX) 40 MG tablet stated that patient picked up same Rx in 20 mg on 12/09/2018 so they need to know if he should be taking both doses or should he only be taking one if so which dose? Asking for a call back with clarification please [facility] [phone]

## 2018-12-23 NOTE — Telephone Encounter (Signed)
Called pt and left VM to call the office.  

## 2018-12-23 NOTE — Telephone Encounter (Signed)
Per OV note 10/13/18 -   Patient states he is compliant with lasix 40mg .  Also taking a 2nd dose at same time so taking 60mg  once symptoms worsened- that seems to have helped some

## 2018-12-23 NOTE — Telephone Encounter (Signed)
Pharmacy called back again to clarify Rx for Lasix. Please advise

## 2018-12-27 NOTE — Telephone Encounter (Signed)
Called pt and left VM to call the office.  

## 2018-12-30 NOTE — Telephone Encounter (Signed)
Called pt and left VM to call the office.  

## 2019-01-02 NOTE — Telephone Encounter (Signed)
LM for patient to return call.  Patient has appointment in office with Dr. Yong Channel on 01/03/2019.

## 2019-01-03 ENCOUNTER — Other Ambulatory Visit: Payer: Self-pay

## 2019-01-03 ENCOUNTER — Encounter: Payer: Self-pay | Admitting: Family Medicine

## 2019-01-03 ENCOUNTER — Ambulatory Visit (INDEPENDENT_AMBULATORY_CARE_PROVIDER_SITE_OTHER): Payer: Medicare Other | Admitting: Family Medicine

## 2019-01-03 VITALS — BP 132/70 | HR 71 | Temp 98.5°F | Ht 71.0 in | Wt 236.0 lb

## 2019-01-03 DIAGNOSIS — E039 Hypothyroidism, unspecified: Secondary | ICD-10-CM

## 2019-01-03 DIAGNOSIS — N183 Chronic kidney disease, stage 3 unspecified: Secondary | ICD-10-CM

## 2019-01-03 DIAGNOSIS — D696 Thrombocytopenia, unspecified: Secondary | ICD-10-CM

## 2019-01-03 DIAGNOSIS — E785 Hyperlipidemia, unspecified: Secondary | ICD-10-CM

## 2019-01-03 DIAGNOSIS — I442 Atrioventricular block, complete: Secondary | ICD-10-CM

## 2019-01-03 DIAGNOSIS — I10 Essential (primary) hypertension: Secondary | ICD-10-CM

## 2019-01-03 DIAGNOSIS — R413 Other amnesia: Secondary | ICD-10-CM

## 2019-01-03 DIAGNOSIS — I5032 Chronic diastolic (congestive) heart failure: Secondary | ICD-10-CM | POA: Diagnosis not present

## 2019-01-03 DIAGNOSIS — F324 Major depressive disorder, single episode, in partial remission: Secondary | ICD-10-CM

## 2019-01-03 LAB — COMPREHENSIVE METABOLIC PANEL
ALT: 13 U/L (ref 0–53)
AST: 18 U/L (ref 0–37)
Albumin: 4.4 g/dL (ref 3.5–5.2)
Alkaline Phosphatase: 79 U/L (ref 39–117)
BUN: 28 mg/dL — ABNORMAL HIGH (ref 6–23)
CO2: 25 mEq/L (ref 19–32)
Calcium: 9.1 mg/dL (ref 8.4–10.5)
Chloride: 105 mEq/L (ref 96–112)
Creatinine, Ser: 1.31 mg/dL (ref 0.40–1.50)
GFR: 52.28 mL/min — ABNORMAL LOW (ref >60.00)
Glucose, Bld: 111 mg/dL — ABNORMAL HIGH (ref 70–99)
Potassium: 4.1 mEq/L (ref 3.5–5.1)
Sodium: 139 mEq/L (ref 135–145)
Total Bilirubin: 0.7 mg/dL (ref 0.2–1.2)
Total Protein: 6.1 g/dL (ref 6.0–8.3)

## 2019-01-03 LAB — CBC WITH DIFFERENTIAL/PLATELET
Basophils Absolute: 0 10*3/uL (ref 0.0–0.1)
Basophils Relative: 0.5 % (ref 0.0–3.0)
Eosinophils Absolute: 0 10*3/uL (ref 0.0–0.7)
Eosinophils Relative: 0.8 % (ref 0.0–5.0)
HCT: 40.6 % (ref 39.0–52.0)
Hemoglobin: 13.6 g/dL (ref 13.0–17.0)
Lymphocytes Relative: 26.5 % (ref 12.0–46.0)
Lymphs Abs: 1.6 10*3/uL (ref 0.7–4.0)
MCHC: 33.4 g/dL (ref 30.0–36.0)
MCV: 78.8 fl (ref 78.0–100.0)
Monocytes Absolute: 0.5 10*3/uL (ref 0.1–1.0)
Monocytes Relative: 7.7 % (ref 3.0–12.0)
Neutro Abs: 3.9 10*3/uL (ref 1.4–7.7)
Neutrophils Relative %: 64.5 % (ref 43.0–77.0)
Platelets: 115 10*3/uL — ABNORMAL LOW (ref 150.0–400.0)
RBC: 5.15 Mil/uL (ref 4.22–5.81)
RDW: 15.9 % — ABNORMAL HIGH (ref 11.5–15.5)
WBC: 6.1 10*3/uL (ref 4.0–10.5)

## 2019-01-03 LAB — BRAIN NATRIURETIC PEPTIDE: Pro B Natriuretic peptide (BNP): 39 pg/mL (ref 0.0–100.0)

## 2019-01-03 LAB — LDL CHOLESTEROL, DIRECT: Direct LDL: 127 mg/dL

## 2019-01-03 LAB — TSH: TSH: 4.16 u[IU]/mL (ref 0.35–4.50)

## 2019-01-03 MED ORDER — AMLODIPINE BESYLATE 2.5 MG PO TABS: 2.5000 mg | ORAL_TABLET | Freq: Every day | ORAL | 3 refills | Status: DC

## 2019-01-03 MED ORDER — FUROSEMIDE 40 MG PO TABS: 40.0000 mg | ORAL_TABLET | Freq: Two times a day (BID) | ORAL | 0 refills | Status: DC

## 2019-01-03 MED ORDER — FUROSEMIDE 20 MG PO TABS: 60.0000 mg | ORAL_TABLET | Freq: Every day | ORAL | 3 refills | Status: DC

## 2019-01-03 NOTE — Progress Notes (Signed)
Phone [phone]   Subjective:  Hunter [name] is a 85 y.o. year old very pleasant male patient who presents for/with See problem oriented charting Chief Complaint  Patient presents with  . Edema    Bilateral lower extremities, red and hard time walking   ROS-patient with worsening edema in legs as well as redness when he was off Lasix, reports shortness of breath is worsened, weight creeping up some as well.  No chest pain reported.  No fever chills reported.  Past Medical History-  Patient Active Problem List   Diagnosis Date Noted  . Complete heart block ([place]) 02/08/2017    Priority: High  . Benign hypertrophy of prostate 05/10/2015    Priority: High  . (HFpEF) heart failure with preserved ejection fraction ([place]) 12/06/2013    Priority: High  . 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 Thrombocytopenia ([place]) 05/23/2012    Priority: Medium  . Neuropathy ([place]) 11/02/2011    Priority: Medium  . CKD (chronic kidney disease), stage III ([place]) 05/27/2011    Priority: Medium  . Memory loss 04/08/2011    Priority: Medium  . Hypothyroidism 08/15/2008    Priority: Medium  . ANEMIA 08/15/2008    Priority: Medium  . Venous stasis 08/15/2008    Priority: Medium  . Hyperlipidemia 02/02/2007    Priority: Medium  . Major depression in partial remission ([place]) 02/02/2007    Priority: Medium  . Essential hypertension 02/02/2007    Priority: Medium  . Skin lesion of left lower extremity 03/26/2014    Priority: Low  . Stasis eczema 03/26/2014    Priority: Low  . History of skin cancer 03/26/2014    Priority: Low  . Syncope 08/14/2013    Priority: Low  . Low back pain 04/08/2011    Priority: Low  . LBBB (left bundle branch block) 05/12/2010    Priority: Low  . GAIT IMBALANCE 04/23/2010    Priority: Low  . SPINAL STENOSIS, CERVICAL 03/21/2010    Priority: Low  . Constipation 03/19/2010    Priority: Low  . Urinary frequency 09/05/2009   Priority: Low  . Vitamin D deficiency 08/15/2008    Priority: Low  . Allergic rhinitis 02/02/2007    Priority: Low  . GERD 02/02/2007    Priority: Low  . Generalized weakness 10/13/2018    Medications- reviewed and updated Current Outpatient Medications  Medication Sig Dispense Refill  . amLODipine (NORVASC) 5 MG tablet Take 1 tablet (5 mg total) by mouth daily. 90 tablet 1  . atorvastatin (LIPITOR) 20 MG tablet Take 1 tablet (20 mg total) by mouth daily. 90 tablet 3  . Calcium Carbonate-Vitamin D (OYSTER SHELL CALCIUM 500 + D PO) Take 500 mg by mouth daily.    Marland Kitchen docusate sodium (COLACE) 100 MG capsule Take 100 mg by mouth daily.     . ferrous sulfate 325 (65 FE) MG tablet Take 325 mg by mouth daily with breakfast.    . furosemide (LASIX) 40 MG tablet Take 1 tablet (40 mg total) by mouth daily. 90 tablet 1  . levothyroxine (SYNTHROID, LEVOTHROID) 75 MCG tablet Take 1 tablet (75 mcg total) by mouth every morning. 90 tablet 1  . losartan (COZAAR) 50 MG tablet TAKE 1 TABLET BY MOUTH EVERY DAY 90 tablet 1  . sertraline (ZOLOFT) 100 MG tablet TAKE TWO TABLETS BY MOUTH EVERY DAY 180 tablet 0   No current facility-administered medications for this visit.  Objective:  BP 132/70 (BP Location: Left Arm, Patient Position: Sitting, Cuff Size: Normal)   Pulse 71   Temp 98.5 F (36.9 C) (Oral)   Ht 5\' 11"  (1.803 m)   Wt 236 lb (107 kg)   SpO2 95%   BMI 32.92 kg/m  Gen: NAD, resting comfortably CV: RRR no murmurs rubs or gallops Lungs: CTAB no crackles, wheeze, rhonchi Abdomen: soft/nontender/nondistended/normal bowel sounds. Ext: 2+ pitting edema with erythema over bilateral lower legs- equal. Not warm.  Skin: warm, dry Neuro: hard of hearing, forgetful    Assessment and Plan    #Heart failure with preserved ejection fraction, venous stasis S: Patient reports for the most part has been compliant with Lasix 60 mg daily- taking 40 mg in combination with some old 20 mg tablets.   Apparently patient ran out of Lasix for about a week and has noted worsened swelling in his legs as well as redness since that time.  Weight is up 3 pounds.  He feels more short of breath as well A/P: I am concerned patient has had worsening of his heart failure with being off Lasix for a week-we will trial Lasix 40 mg twice a day for 7 days and then resume 60 mg dosing- patient will return for a visit within about 2 weeks to make sure he understands the instructions and has complied to recheck BMP  #/memory loss in patient with hearing loss and major depression in partial remission S: Patient has been discouraged with COVID-19 in addition to living alone.  Patient has struggled with memory for some time- hearing may contribute and he is still trying to get hearing aids.  In the past patient has been followed by Adult Protective Services but they later released him.  I tried to get Thn involved in his care but he was eventually released from them as well. A/P: I am concerned about patient's long-term ability to care for himself.  I have really struggled to assess him thoroughly for memory loss due to hearing issues- we will see if we can get the assistance of neurology for their opinion on his memory loss-asked them to send me the day of appointment if they cannot reach him.  Certainly depression and hearing loss can contribute to perceived memory loss.   -Poor control depression-patient is already on Zoloft 200 mg and this will be continued- suspect he may need counseling but may be challenging with hearing loss and transportation issuesI will try to discuss elevated PHQ 9 at follow-up- I asked our team to complete this at the end of the visit due to patient reporting difficulty with COVID-19.  Could also consider psychiatry  #hypertension S: controlled on amlodipine 5 mg and Lasix 60 mg daily and losartan 50 mg BP Readings from Last 3 Encounters:  01/03/19 132/70  05/19/18 110/60  05/19/18 110/60   A/P: Good control-we will try to further reduce amlodipine to 2.5 mg as this may help with venous stasis and heart failure- with goal to stop amlodipine completely if possible at follow-up   #hypothyroidism S: On thyroid medication-compliant with levothyroxine 75 mcg Lab Results  Component Value Date   TSH 4.16 01/03/2019  A/P: Stable. Continue current medications.     #hyperlipidemia S: Poorly controlled on atorvastatin 20 mg Lab Results  Component Value Date   CHOL 179 02/16/2018   HDL 28.40 (L) 02/16/2018   LDLCALC 113 (H) 02/16/2018   LDLDIRECT 127.0 01/03/2019   TRIG 188.0 (H) 02/16/2018   CHOLHDL 6  02/16/2018   A/P: I am hesitant to increase dose of statin due to baseline memory loss issues-we will try to make sure he is compliant with current dose.   #Complete heart block- stable with pacemaker in place.  #Thrombocytopenia-noted in the past.  Largely stable today-continue to monitor at least every 6 months Lab Results  Component Value Date   WBC 6.1 01/03/2019   HGB 13.6 01/03/2019   HCT 40.6 01/03/2019   MCV 78.8 01/03/2019   PLT 115.0 (L) 01/03/2019   Advised approximately 2-week follow-up Future Appointments  Date Time Provider [place]  01/12/2019  3:00 PM [name], MD [facility]  02/22/2019  8:25 AM [facility] DEVICE REMOTES [facility]  05/19/2019  9:15 AM Allred, Jeneen Rinks, MD [facility]   Lab/Order associations:   ICD-10-CM   1. Chronic heart failure with preserved ejection fraction (HCC)  I50.32 B Nat Peptide  2. Hypothyroidism, unspecified type  E03.9 TSH  3. Hyperlipidemia, unspecified hyperlipidemia type  E78.5 LDL cholesterol, direct    CBC with Differential/Platelet    Comprehensive metabolic panel  4. Memory loss  R41.3 Ambulatory referral to Neurology  5. Major depressive disorder with single episode, in partial remission ([place])  F32.4   6. Complete heart block (HCC) Chronic I44.2   7. Thrombocytopenia (HCC)  Chronic D69.6   8. CKD (chronic kidney disease), stage III (HCC)  N18.3 CBC with Differential/Platelet    Comprehensive metabolic panel  9. Essential hypertension  I10    Meds ordered this encounter  Medications  . furosemide (LASIX) 40 MG tablet    Sig: Take 1 tablet (40 mg total) by mouth 2 (two) times daily. For 7 days. After 7 days. Go back to 60mg  dose (3 of the 20mg  pills)    Dispense:  14 tablet    Refill:  0  . furosemide (LASIX) 20 MG tablet    Sig: Take 3 tablets (60 mg total) by mouth daily. Start after taking 40mg  twice a day for 7 days.    Dispense:  270 tablet    Refill:  3  . amLODipine (NORVASC) 2.5 MG tablet    Sig: Take 1 tablet (2.5 mg total) by mouth daily.    Dispense:  90 tablet    Refill:  3    Return precautions advised.  Garret Reddish, MD

## 2019-01-03 NOTE — Assessment & Plan Note (Signed)
S: controlled on amlodipine 5 mg and Lasix 60 mg daily as well as losartan 50 mg BP Readings from Last 3 Encounters:  01/03/19 132/70  05/19/18 110/60  05/19/18 110/60  A/P: Good control-we will try to further reduce amlodipine to 2.5 mg as this may help with venous stasis and heart failure- with goal to stop amlodipine completely if possible at follow-up

## 2019-01-03 NOTE — Patient Instructions (Addendum)
Reduce amlodipine to 2.5 mg (stop the 5 mg pill)  Take lasix twice a day for 7 days. See me back in 1-2 weeks so we can check on fluid in legs and blood pressure  After 7 days of lasix- then go back to 60mg  (you will take 3 of the 20mg  dose).   I am going to refer you to neurology with memory loss and hearing loss- I am concerned about your long term health with living alone and memory issues and want to see what resources they can help [place] with.   Our team is going to update a depression screener and we will try to chat about this more at next visit. If you have thoughts of self harm I want to chat with you before you leave though)

## 2019-01-03 NOTE — Telephone Encounter (Signed)
Spoke to Hunter [name] at [facility] and clarified dosage of his Lasix should be 40 mg qd.  Advised that patient is here for an OV today 6/23 and if any changes are to be made to current dosage, we will send new script over.  Hunter [name] verbalizes understanding.

## 2019-01-10 ENCOUNTER — Encounter: Payer: Self-pay | Admitting: Neurology

## 2019-01-12 ENCOUNTER — Encounter: Payer: Self-pay | Admitting: Family Medicine

## 2019-01-12 ENCOUNTER — Ambulatory Visit (INDEPENDENT_AMBULATORY_CARE_PROVIDER_SITE_OTHER): Payer: Medicare Other | Admitting: Family Medicine

## 2019-01-12 ENCOUNTER — Other Ambulatory Visit: Payer: Self-pay

## 2019-01-12 VITALS — BP 140/80 | HR 79 | Temp 98.3°F | Ht 71.0 in | Wt 237.8 lb

## 2019-01-12 DIAGNOSIS — N401 Enlarged prostate with lower urinary tract symptoms: Secondary | ICD-10-CM | POA: Diagnosis not present

## 2019-01-12 DIAGNOSIS — I5032 Chronic diastolic (congestive) heart failure: Secondary | ICD-10-CM | POA: Diagnosis not present

## 2019-01-12 DIAGNOSIS — R32 Unspecified urinary incontinence: Secondary | ICD-10-CM | POA: Diagnosis not present

## 2019-01-12 DIAGNOSIS — I10 Essential (primary) hypertension: Secondary | ICD-10-CM

## 2019-01-12 NOTE — Progress Notes (Signed)
Phone [phone]   Subjective:  Hunter [name] is a 85 y.o. year old very pleasant male patient who presents for/with See problem oriented charting Chief Complaint  Patient presents with  . Congestive Heart Failure    2 week f/u   ROS- stable swelling and shortness of breath. No fever or chills. Weight largely stable   Past Medical History-  Patient Active Problem List   Diagnosis Date Noted  . Complete heart block ([place]) 02/08/2017    Priority: High  . Benign hypertrophy of prostate 05/10/2015    Priority: High  . (HFpEF) heart failure with preserved ejection fraction ([place]) 12/06/2013    Priority: High  . 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 Thrombocytopenia ([place]) 05/23/2012    Priority: Medium  . Neuropathy ([place]) 11/02/2011    Priority: Medium  . CKD (chronic kidney disease), stage III ([place]) 05/27/2011    Priority: Medium  . Memory loss 04/08/2011    Priority: Medium  . Hypothyroidism 08/15/2008    Priority: Medium  . ANEMIA 08/15/2008    Priority: Medium  . Venous stasis 08/15/2008    Priority: Medium  . Hyperlipidemia 02/02/2007    Priority: Medium  . Major depression in partial remission ([place]) 02/02/2007    Priority: Medium  . Essential hypertension 02/02/2007    Priority: Medium  . Skin lesion of left lower extremity 03/26/2014    Priority: Low  . Stasis eczema 03/26/2014    Priority: Low  . History of skin cancer 03/26/2014    Priority: Low  . Syncope 08/14/2013    Priority: Low  . Low back pain 04/08/2011    Priority: Low  . LBBB (left bundle branch block) 05/12/2010    Priority: Low  . GAIT IMBALANCE 04/23/2010    Priority: Low  . SPINAL STENOSIS, CERVICAL 03/21/2010    Priority: Low  . Constipation 03/19/2010    Priority: Low  . Urinary frequency 09/05/2009    Priority: Low  . Vitamin D deficiency 08/15/2008    Priority: Low  . Allergic rhinitis 02/02/2007    Priority: Low  . GERD 02/02/2007   Priority: Low  . Generalized weakness 10/13/2018    Medications- reviewed and updated Current Outpatient Medications  Medication Sig Dispense Refill  . amLODipine (NORVASC) 2.5 MG tablet Take 1 tablet (2.5 mg total) by mouth daily. 90 tablet 3  . atorvastatin (LIPITOR) 20 MG tablet Take 1 tablet (20 mg total) by mouth daily. 90 tablet 3  . Calcium Carbonate-Vitamin D (OYSTER SHELL CALCIUM 500 + D PO) Take 500 mg by mouth daily.    Marland Kitchen docusate sodium (COLACE) 100 MG capsule Take 100 mg by mouth daily.     . ferrous sulfate 325 (65 FE) MG tablet Take 325 mg by mouth daily with breakfast.    . furosemide (LASIX) 20 MG tablet Take 3 tablets (60 mg total) by mouth daily. Start after taking 40mg  twice a day for 7 days. 270 tablet 3  . levothyroxine (SYNTHROID, LEVOTHROID) 75 MCG tablet Take 1 tablet (75 mcg total) by mouth every morning. 90 tablet 1  . losartan (COZAAR) 50 MG tablet TAKE 1 TABLET BY MOUTH EVERY DAY 90 tablet 1  . sertraline (ZOLOFT) 100 MG tablet TAKE TWO TABLETS BY MOUTH EVERY DAY 180 tablet 0  . furosemide (LASIX) 40 MG tablet Take 1 tablet (40 mg total) by mouth 2 (two) times daily. For 7 days. After 7 days. Go back to  60mg  dose (3 of the 20mg  pills) 14 tablet 0   No current facility-administered medications for this visit.      Objective:  BP 140/80 Comment: still talking  Pulse 79   Temp 98.3 F (36.8 C) (Oral)   Ht 5\' 11"  (1.803 m)   Wt 237 lb 12.8 oz (107.9 kg)   SpO2 97%   BMI 33.17 kg/m  Gen: NAD, resting comfortably CV: RRR no murmurs rubs or gallops Lungs: CTAB no crackles, wheeze, rhonchi Abdomen: soft/nontender/nondistended Ext: 2+ pitting edema with venous stasis skin changes Skin: warm, dry Neuro: hard of hearing- have to type in large letters for him to understand me        Assessment and Plan   # Heart failure with preserved ejection fraction, venous stasis/hypertension S: Patient unfortunately is a poor historian likely due to memory issues  and has no one to assist him.  He says he has a son in [place] but all he ever does ask for money.  The plan at last visit was Lasix 40 mg BID for 7 days and then resume Lasix 60 mg daily. Weight is stable.   Patient brings his pill packs with offset and dates as apparently he picked up the pill packs late.  He is still taking amlodipine 5 mg and Lasix 40 mg daily-his weight has slightly increased and his edema has not improved nor has his shortness of breath.  We called the pharmacy and confirmed he never picked up the 40 mg twice daily tablets even though he reported taking these. A/P: Stable from last visit but still volume overloaded- I am grateful to my team for calling and working with the pharmacy-patient is to return and stay at the pharmacy until pill packs are reloaded-he is to take his old pills in because insurance will not pay for new pills.  He is going to do 40 mg twice a day for a week and then resume 60 mg daily while at the same time reducing amlodipine to 2.5 mg with new pill packs.  He is going to return in 2 or 3 weeks for repeat evaluation.  Blood pressure is controlled today and with him being fluid overloaded- hoping blood pressure will remain controlled on higher Lasix and lower amlodipine-lower amlodipine dose or no amlodipine ideal given edema/heart failure issues but has worked well for blood pressure.  # Depression S: Poor control of depression on Zoloft 200 mg- COVID-19 has been very challenging for him.  He really does not have any when he can interact with and as noted above-Son with both legs amputated in [place]- was always asking for $per patient so doesn't have good relationship  He has been on other SSRIs in the past and feels this is the most effective.wellbutrin in the past and not sure how effective that is.   Depression screen PHQ 2/9 01/12/2019  Decreased Interest 3  Down, Depressed, Hopeless 2  PHQ - 2 Score 5  Altered sleeping 2  Tired, decreased  energy 2  Change in appetite 2  Feeling bad or failure about yourself  2  Trouble concentrating 1  Moving slowly or fidgety/restless 1  Suicidal thoughts 1  PHQ-9 Score 16  Difficult doing work/chores Somewhat difficult  A/P: Poor control on Zoloft alone which has been most helpful for him in the past -He does not seem to want to start Wellbutrin at this time -Hard of hearing so online counseling would likely not be beneficial [facility] urology august 8th-  per him -Options are limited-we will essentially have to continue to monitor  #BPH with nocturia and incontinence S: Patient with history of TURP- reports worsening incontinence issues since that time.  He would like to meet with urology to see if there are any options A/P: Urology referral placed today  Future Appointments  Date Time Provider [place]  02/15/2019  9:00 AM Cameron Sprang, MD [facility] None  02/22/2019  8:25 AM [facility] DEVICE REMOTES [facility]  05/19/2019  9:15 AM Allred, Jeneen Rinks, MD [facility]   Lab/Order associations:   ICD-10-CM   1. Chronic heart failure with preserved ejection fraction (HCC)  O59.29 Basic metabolic panel    Basic metabolic panel    CANCELED: Basic metabolic panel  2. Benign prostatic hyperplasia with lower urinary tract symptoms, symptom details unspecified  N40.1 Ambulatory referral to Urology  3. Essential hypertension  I10   4. Urinary incontinence, unspecified type  R32 Ambulatory referral to Urology   Return precautions advised.  Garret Reddish, MD

## 2019-01-12 NOTE — Patient Instructions (Addendum)
Big things 1. Decrease Amlodipine to 2.5mg  2. Increase Lasix to 60mg  3. Labs today 4. Pick up new pill packs from [facility] drug (see instructions below) 5. 2 week follow up - schedule before you leave   Take your box of pills to your pharmacy when you leave today or first thing in the morning tomorrow. Your pharmacist will need to re-pack your medications with your new prescriptions. Wait at your pharmacy for the repackaging. You will start your new pill packs on Saturday.

## 2019-01-13 ENCOUNTER — Encounter: Payer: Self-pay | Admitting: Family Medicine

## 2019-01-13 LAB — SPECIMEN COMPROMISED

## 2019-01-13 LAB — BASIC METABOLIC PANEL
BUN/Creatinine Ratio: 17 (calc) (ref 6–22)
BUN: 22 mg/dL (ref 7–25)
CO2: 26 mmol/L (ref 20–32)
Calcium: 9.2 mg/dL (ref 8.6–10.3)
Chloride: 107 mmol/L (ref 98–110)
Creat: 1.32 mg/dL — ABNORMAL HIGH (ref 0.70–1.11)
Glucose, Bld: 93 mg/dL (ref 65–99)
Potassium: 4.5 mmol/L (ref 3.5–5.3)
Sodium: 142 mmol/L (ref 135–146)

## 2019-01-13 NOTE — Assessment & Plan Note (Signed)
S: Patient unfortunately is a poor historian likely due to memory issues and has no one to assist him.  He says he has a son in [place] but all he ever does ask for money.  The plan at last visit was Lasix 40 mg BID for 7 days and then resume Lasix 60 mg daily. Weight is stable.   Patient brings his pill packs with offset and dates as apparently he picked up the pill packs late.  He is still taking amlodipine 5 mg and Lasix 40 mg daily-his weight has slightly increased and his edema has not improved nor has his shortness of breath.  We called the pharmacy and confirmed he never picked up the 40 mg twice daily tablets even though he reported taking these. A/P: Stable from last visit but still volume overloaded- I am grateful to my team for calling and working with the pharmacy-patient is to return and stay at the pharmacy until pill packs are reloaded-he is to take his old pills in because insurance will not pay for new pills.  He is going to do 40 mg twice a day for a week and then resume 60 mg daily while at the same time reducing amlodipine to 2.5 mg with new pill packs.  He is going to return in 2 or 3 weeks for repeat evaluation.  Blood pressure is controlled today and with him being fluid overloaded- hoping blood pressure will remain controlled on higher Lasix and lower amlodipine-lower amlodipine dose or no amlodipine ideal given edema/heart failure issues but has worked well for blood pressure.

## 2019-01-13 NOTE — Assessment & Plan Note (Signed)
Good control but want to decrease amlodipine and fluid overload continues to be an issue.  Continue losartan 50 mg.  Increase Lasix to 60 mg from 40 mg after 2-week burst of 40 mg twice daily.  Decrease amlodipine to 2.5 mg.  Follow-up 2 to 3 weeks

## 2019-02-02 ENCOUNTER — Other Ambulatory Visit: Payer: Self-pay

## 2019-02-02 NOTE — Patient Outreach (Signed)
[facility]) Care Management  02/02/2019  SWAYZE PRIES 1933-07-27 [id number]   Medication Adherence call to Mr. Lonny Eisen Telephone call to Patient regarding Medication Adherence unable to reach patient.Mr. Piascik is showing past due on Losartan 50 mg and Atorvastatin 20 mg under [facility].   [facility] Management Direct Dial [phone]  Fax [phone] [email or web address]

## 2019-02-15 ENCOUNTER — Encounter: Payer: Self-pay | Admitting: Neurology

## 2019-02-15 ENCOUNTER — Ambulatory Visit (INDEPENDENT_AMBULATORY_CARE_PROVIDER_SITE_OTHER): Payer: Medicare Other | Admitting: Neurology

## 2019-02-15 ENCOUNTER — Other Ambulatory Visit: Payer: Self-pay

## 2019-02-15 VITALS — BP 159/67 | HR 98 | Ht 71.0 in | Wt 236.4 lb

## 2019-02-15 DIAGNOSIS — G3184 Mild cognitive impairment, so stated: Secondary | ICD-10-CM

## 2019-02-15 NOTE — Patient Outreach (Signed)
[facility]) Care Management  02/15/2019  MATIX HENSHAW Jul 09, 1934 [id number]   Medication Adherence call to Mr. Pavlos Yon Telephone call to Patient regarding Medication Adherence unable to reach patient. Mr. Tally is showing past due on Losartan 25 mg and Atorvastatin 20 mg under [facility].   [facility] Management Direct Dial [phone]  Fax [phone] [email or web address]

## 2019-02-15 NOTE — Progress Notes (Signed)
NEUROLOGY CONSULTATION NOTE  [name] MRN: [id number] DOB: [birth year]  Referring provider: Dr. Garret Reddish Primary care provider: Dr. Garret Reddish  Reason for consult:  Memory loss  Dear Dr Yong Channel:  Thank you for your kind referral of Hunter [name] for consultation of the above symptoms. Although his history is well known to you, please allow me to reiterate it for the purpose of our medical record. He is alone in the office today with no family to corroborate history. Records and images were personally reviewed where available.   HISTORY OF PRESENT ILLNESS: This is an 85 year old right-handed man with a history of hypertension, hyperlipidemia, hypothyroidism, significant hearing loss, complete heart block s/p pacemaker, presenting for evaluation of memory loss. He is significantly hearing impaired, majority of the visit was done with questions written down and patient answering each question. When asked about his memory, he states "I am a worried, I let things bother me, I've always been that way since childhood." He does note a difficulty with his ability to remember things. He recalls getting lost due to not having his eyeglasses, he reports getting lost twice in 2 days finding his PCP's office a year ago. He lives alone and has his medications organized by [employer]. He states he is pretty good with taking his medication but sometimes takes them later than usual if he is busy. He states this is infrequent, "I'm not senile like other 36 year olds." He recently moved back to [place] after living in [place] and states some relatives have passed away and that his son is disabled and unable to help him. He denies missing any bills payments. He misplaces things, he has misplaced his hearing aids and has significant difficulty during today's visit. He recalls leaving the stove on when he got carried away talking to his dog or cat, he was boiling water or a hotdog. Dr. Yong Channel has been  concerned about him and in the past he was followed by Adult Protective Services but later released. [facility] was also involved but he was eventually released from them as well.   He is yawning several times during the visit, he states he is not sleeping well because his dog gets up 6 times in the night. He only got 1 hour of sleep last night, usually he gets only 2-3 hours of sleep but naps during the day. He reports mood is good, "I have always supposedly a good attitude." He denies any headaches, dizziness, vision changes, bladder dysfunction, anosmia, or tremors. He has numbness and tingling in his feet and legs. He has occasional constipation. No recent falls.  No family history of dementia. No history of significant head injuries or alcohol use. He denies any further syncopal episodes since his pacemaker was placed in 2018.   There is a head CT without contrast done in 2012 which did not show any acute changes. There was generalized atrophy and mild chronic microvascular disease.   Laboratory Data: Lab Results  Component Value Date   TSH 4.16 01/03/2019   Lab Results  Component Value Date   [id number] 07/20/2011     PAST MEDICAL HISTORY: Past Medical History:  Diagnosis Date  . Anemia   . Anxiety   . Arthritis    "knees" (02/08/2017)  . Chronic lower back pain   . Complete heart block ([place]) 02/08/2017  . Depression   . Eczema   . GERD (gastroesophageal reflux disease) occasional  . Hearing loss of both ears   .  Hx of echocardiogram    Echo (07/2013): Mild LVH, EF 24-09%, grade 1 diastolic dysfunction, mild LAE, PASP 23  . Hyperlipidemia   . Hypertension   . Hypothyroidism   . LBBB (left bundle branch block)   . Left patella fracture 2018   "no OR" (02/08/2017)  . Nocturia   . Peripheral vascular disease (HCC) LOWER EXTREMITIES  . Squamous cell skin cancer, penis: glans ([place]) 05/2010   Initial excision 11/11; recurrence, excision and laser Rx 9/13    PAST SURGICAL  HISTORY: Past Surgical History:  Procedure Laterality Date  . CIRCUMCISION/ LASER DISSECTION PENILE GLANS CANCER  06-02-2010  . CYSTOSCOPY WITH URETHRAL DILATATION  03/28/2012   Procedure: CYSTOSCOPY WITH URETHRAL DILATATION;  Surgeon: Ailene Rud, MD;  Location: [facility];  Service: Urology;  Laterality: N/A;  excision biopsy extensive meatal penile carcinoma meatoplasty  . EXCISION RIGHT WRIST BENIGN TUMOR  2002 (APPROX)  . hip surgery    . INCISION AND DRAINAGE DEEP NECK ABSCESS    . INGUINAL HERNIA REPAIR  1970's  . KNEE ARTHROSCOPY Right 2017  . PACEMAKER IMPLANT N/A 02/08/2017   Procedure: Pacemaker Implant;  Surgeon: Deboraha Sprang, MD;  Location: [facility];  Service: Cardiovascular;  Laterality: N/A;  . PACEMAKER IMPLANT  02/08/2017   SJM Assurity MRI dual chamber PPM implanted by Dr Caryl Comes for complete heart block  . PENILE BX  05-09-2010  . TONSILLECTOMY    . TRANSURETHRAL RESECTION OF PROSTATE N/A 05/10/2015   Procedure: CYSTOSCOPY, URETHRAL MEATAL DILATION, TRANSURETHRAL RESECTION OF THE PROSTATE (TURP);  Surgeon: Carolan Clines, MD;  Location: [facility];  Service: Urology;  Laterality: N/A;    MEDICATIONS: Current Outpatient Medications on File Prior to Visit  Medication Sig Dispense Refill  . amLODipine (NORVASC) 2.5 MG tablet Take 1 tablet (2.5 mg total) by mouth daily. 90 tablet 3  . atorvastatin (LIPITOR) 20 MG tablet Take 1 tablet (20 mg total) by mouth daily. 90 tablet 3  . Calcium Carbonate-Vitamin D (OYSTER SHELL CALCIUM 500 + D PO) Take 500 mg by mouth daily.    Marland Kitchen docusate sodium (COLACE) 100 MG capsule Take 100 mg by mouth daily.     . ferrous sulfate 325 (65 FE) MG tablet Take 325 mg by mouth daily with breakfast.    . furosemide (LASIX) 20 MG tablet Take 3 tablets (60 mg total) by mouth daily. Start after taking 40mg  twice a day for 7 days. 270 tablet 3  . furosemide (LASIX) 40 MG tablet Take 1 tablet (40 mg total) by mouth 2  (two) times daily. For 7 days. After 7 days. Go back to 60mg  dose (3 of the 20mg  pills) 14 tablet 0  . levothyroxine (SYNTHROID, LEVOTHROID) 75 MCG tablet Take 1 tablet (75 mcg total) by mouth every morning. 90 tablet 1  . losartan (COZAAR) 50 MG tablet TAKE 1 TABLET BY MOUTH EVERY DAY 90 tablet 1  . sertraline (ZOLOFT) 100 MG tablet TAKE TWO TABLETS BY MOUTH EVERY DAY 180 tablet 0   No current facility-administered medications on file prior to visit.     ALLERGIES: No Known Allergies  FAMILY HISTORY: Family History  Problem Relation Age of Onset  . Lung cancer Mother   . Arthritis Other        family hx  . Hyperlipidemia Other        family hx  . Hypertension Other        family hx  . Prostate cancer Other  family hx    SOCIAL HISTORY: Social History   Socioeconomic History  . Marital status: Divorced    Spouse name: Not on file  . Number of children: Not on file  . Years of education: Not on file  . Highest education level: Not on file  Occupational History  . Not on file  Social Needs  . Financial resource strain: Not on file  . Food insecurity    Worry: Not on file    Inability: Not on file  . Transportation needs    Medical: Not on file    Non-medical: Not on file  Tobacco Use  . Smoking status: Never Smoker  . Smokeless tobacco: Never Used  Substance and Sexual Activity  . Alcohol use: Yes    Comment: 02/08/2017 "nothing in the 2000s"  . Drug use: No  . Sexual activity: Not Currently  Lifestyle  . Physical activity    Days per week: Not on file    Minutes per session: Not on file  . Stress: Not on file  Relationships  . Social Herbalist on phone: Not on file    Gets together: Not on file    Attends religious service: Not on file    Active member of club or organization: Not on file    Attends meetings of clubs or organizations: Not on file    Relationship status: Not on file  . Intimate partner violence    Fear of current or ex  partner: Not on file    Emotionally abused: Not on file    Physically abused: Not on file    Forced sexual activity: Not on file  Other Topics Concern  . Not on file  Social History Narrative   Lives alone. Has a cat and a dog      Ambulates with cane      Right handed      Former Engineer, maintenance (IT)    REVIEW OF SYSTEMS: Constitutional: No fevers, chills, or sweats, no generalized fatigue, change in appetite Eyes: No visual changes, double vision, eye pain Ear, nose and throat: No hearing loss, ear pain, nasal congestion, sore throat Cardiovascular: No chest pain, palpitations Respiratory:  No shortness of breath at rest or with exertion, wheezes GastrointestinaI: No nausea, vomiting, diarrhea, abdominal pain, fecal incontinence Genitourinary:  No dysuria, urinary retention or frequency Musculoskeletal:  No neck pain, back pain Integumentary: No rash, pruritus, skin lesions Neurological: as above Psychiatric: No depression, insomnia, anxiety Endocrine: No palpitations, fatigue, diaphoresis, mood swings, change in appetite, change in weight, increased thirst Hematologic/Lymphatic:  No anemia, purpura, petechiae. Allergic/Immunologic: no itchy/runny eyes, nasal congestion, recent allergic reactions, rashes  PHYSICAL EXAM: Vitals:   02/15/19 0854  BP: (!) 159/67  Pulse: 98  SpO2: (!) 79%   General: No acute distress, very HOH Head:  Normocephalic/atraumatic Skin/Extremities: No rash, no edema Neurological Exam: Mental status: alert and oriented to person, place, and time, no dysarthria or aphasia, Fund of knowledge is appropriate.  Remote memory appears intact.  Attention and concentration are reduced.  Able to name objects and repeat phrases.  Montreal Cognitive Assessment  02/24/2019  Visuospatial/ Executive (0/5) 2  Naming (0/3) 2  Attention: Read list of digits (0/2) 1  Attention: Read list of letters (0/1) 0  Attention: Serial 7 subtraction starting at 100 (0/3) 2  Language:  Repeat phrase (0/2) 1  Language : Fluency (0/1) 0  Abstraction (0/2) 2  Delayed Recall (0/5) 0  Orientation (0/6) 5  Total 15   Cranial nerves: CN I: not tested CN II: pupils equal, round and reactive to light, visual fields intact CN III, IV, VI:  full range of motion, no nystagmus, no ptosis CN V: facial sensation intact CN VII: upper and lower face symmetric CN VIII: hearing significantly impaired CN IX, X: gag intact, uvula midline CN XI: sternocleidomastoid and trapezius muscles intact CN XII: tongue midline Bulk & Tone: normal, no fasciculations. Motor: 5/5 throughout with no pronator drift. Sensation: intact to light touch, cold.  Romberg test negative Deep Tendon Reflexes: +1 throughout, no ankle clonus Plantar responses: downgoing bilaterally Cerebellar: no incoordination on finger to nose testing Gait: slow and cautious, no ataxia Tremor: none  IMPRESSION: This is an 85 year old right-handed man with a history of hypertension, hyperlipidemia, hypothyroidism, significant hearing loss, complete heart block s/p pacemaker, presenting for evaluation of memory loss. He is significantly hearing impaired without his hearing aids today. The visit was conducted with questions written down and patient answering them accordingly. His MOCA score today is 15/30, he denies any difficulties with complex tasks although it appears there has been concern for medication compliance due to memory loss. There is no family to corroborate history. Concern from PCP is regarding his long-term ability to care for himself. He will be scheduled for Neurocognitive testing to further evaluation cognitive complaints and help guide long-term management. He was advised repeatedly and instructions written down that he should have his hearing aids for the testing. He will follow-up after testing and knows to call for any changes.   Thank you for allowing me to participate in the care of this patient. Please do not  hesitate to call for any questions or concerns.   Ellouise Newer, M.D.  CC: Dr. Yong Channel

## 2019-02-15 NOTE — Patient Instructions (Signed)
1. Schedule Neurocognitive testing in our office. Please make sure you have your hearing aids when you do the testing  2. Follow-up in 6 months, call for any changes  RECOMMENDATIONS FOR ALL PATIENTS WITH MEMORY PROBLEMS: 1. Continue to exercise (Recommend 30 minutes of walking everyday, or 3 hours every week) 2. Increase social interactions - continue going to [place] and enjoy social gatherings with friends and family 3. Eat healthy, avoid fried foods and eat more fruits and vegetables 4. Maintain adequate blood pressure, blood sugar, and blood cholesterol level. Reducing the risk of stroke and cardiovascular disease also helps promoting better memory. 5. Avoid stressful situations. Live a simple life and avoid aggravations. Organize your time and prepare for the next day in anticipation. 6. Sleep well, avoid any interruptions of sleep and avoid any distractions in the bedroom that may interfere with adequate sleep quality 7. Avoid sugar, avoid sweets as there is a strong link between excessive sugar intake, diabetes, and cognitive impairment The Mediterranean diet has been shown to help patients reduce the risk of progressive memory disorders and reduces cardiovascular risk. This includes eating fish, eat fruits and green leafy vegetables, nuts like almonds and hazelnuts, walnuts, and also use olive oil. Avoid fast foods and fried foods as much as possible. Avoid sweets and sugar as sugar use has been linked to worsening of memory function.  There is always a concern of gradual progression of memory problems. If this is the case, then we may need to adjust level of care according to patient needs. Support, both to the patient and caregiver, should then be put into place.

## 2019-02-22 ENCOUNTER — Encounter: Payer: Medicare Other | Admitting: *Deleted

## 2019-02-24 ENCOUNTER — Encounter: Payer: Self-pay | Admitting: Neurology

## 2019-03-02 ENCOUNTER — Encounter: Payer: Self-pay | Admitting: Cardiology

## 2019-03-16 DIAGNOSIS — E039 Hypothyroidism, unspecified: Secondary | ICD-10-CM | POA: Diagnosis not present

## 2019-03-16 DIAGNOSIS — Z7982 Long term (current) use of aspirin: Secondary | ICD-10-CM | POA: Diagnosis not present

## 2019-03-16 DIAGNOSIS — L03116 Cellulitis of left lower limb: Secondary | ICD-10-CM | POA: Diagnosis not present

## 2019-03-16 DIAGNOSIS — I872 Venous insufficiency (chronic) (peripheral): Secondary | ICD-10-CM | POA: Diagnosis not present

## 2019-03-16 DIAGNOSIS — B9562 Methicillin resistant Staphylococcus aureus infection as the cause of diseases classified elsewhere: Secondary | ICD-10-CM | POA: Diagnosis not present

## 2019-03-16 DIAGNOSIS — R410 Disorientation, unspecified: Secondary | ICD-10-CM | POA: Diagnosis not present

## 2019-03-16 DIAGNOSIS — S82002D Unspecified fracture of left patella, subsequent encounter for closed fracture with routine healing: Secondary | ICD-10-CM | POA: Diagnosis not present

## 2019-03-16 DIAGNOSIS — M25762 Osteophyte, left knee: Secondary | ICD-10-CM | POA: Diagnosis not present

## 2019-03-16 DIAGNOSIS — M1712 Unilateral primary osteoarthritis, left knee: Secondary | ICD-10-CM | POA: Diagnosis not present

## 2019-03-16 DIAGNOSIS — E7800 Pure hypercholesterolemia, unspecified: Secondary | ICD-10-CM | POA: Diagnosis not present

## 2019-03-16 DIAGNOSIS — I10 Essential (primary) hypertension: Secondary | ICD-10-CM | POA: Diagnosis not present

## 2019-03-16 DIAGNOSIS — Z9181 History of falling: Secondary | ICD-10-CM | POA: Diagnosis not present

## 2019-03-16 DIAGNOSIS — Z23 Encounter for immunization: Secondary | ICD-10-CM | POA: Diagnosis not present

## 2019-03-16 DIAGNOSIS — L02416 Cutaneous abscess of left lower limb: Secondary | ICD-10-CM | POA: Diagnosis not present

## 2019-03-31 ENCOUNTER — Telehealth: Payer: Self-pay | Admitting: Physical Therapy

## 2019-03-31 NOTE — Telephone Encounter (Signed)
Current med list faxed to [facility]

## 2019-03-31 NOTE — Telephone Encounter (Signed)
Copied from [place] [id number]. Topic: General - Other >> Mar 31, 2019 10:54 AM Jodie Echevaria wrote: Reason for CRM: [place], [place] [phone] (Phone) [phone] (Fax)  Called to request an updated list of patients current medication faxed over please

## 2019-04-04 ENCOUNTER — Other Ambulatory Visit: Payer: Self-pay | Admitting: Family Medicine

## 2019-04-18 ENCOUNTER — Other Ambulatory Visit: Payer: Self-pay

## 2019-04-18 NOTE — Patient Outreach (Signed)
[facility]) Care Management  04/18/2019  Hunter [name] 08/27/1933 [id number]   Medication Adherence call to Hunter [name] [facility] Identifiers Verify spoke with patient he is past due on Atorvastatin 20 mg and Losartan 50 mg pt explain he is still taking 1 tablet daily,patient received a pill pack every month from [facility] and has receive it for this month.Hunter [name] is showing past due under [place].   [facility] Management Direct Dial [phone]  Fax [phone] [email or web address]

## 2019-04-28 ENCOUNTER — Encounter: Payer: Medicare Other | Admitting: Psychology

## 2019-05-01 ENCOUNTER — Encounter: Payer: Medicare Other | Admitting: Psychology

## 2019-05-01 ENCOUNTER — Telehealth: Payer: Self-pay | Admitting: Neurology

## 2019-05-01 NOTE — Telephone Encounter (Signed)
Error

## 2019-05-09 ENCOUNTER — Encounter: Payer: Medicare Other | Admitting: Psychology

## 2019-05-11 ENCOUNTER — Telehealth: Payer: Self-pay | Admitting: Internal Medicine

## 2019-05-11 NOTE — Telephone Encounter (Signed)
Virtual Visit Pre-Appointment Phone Call  "(Name), I am calling you today to discuss your upcoming appointment. We are currently trying to limit exposure to the virus that causes COVID-19 by seeing patients at home rather than in the office."  1. "What is the BEST phone number to call the day of the visit?" - include this in appointment notes  2. Do you have or have access to (through a family member/friend) a smartphone with video capability that we can use for your visit?" a. If yes - list this number in appt notes as cell (if different from BEST phone #) and list the appointment type as a VIDEO visit in appointment notes b. If no - list the appointment type as a PHONE visit in appointment notes  3. Confirm consent - "In the setting of the current Covid19 crisis, you are scheduled for a (phone or video) visit with your provider on (date) at (time).  Just as we do with many in-office visits, in order for you to participate in this visit, we must obtain consent.  If you'd like, I can send this to your mychart (if signed up) or email for you to review.  Otherwise, I can obtain your verbal consent now.  All virtual visits are billed to your insurance company just like a normal visit would be.  By agreeing to a virtual visit, we'd like you to understand that the technology does not allow for your provider to perform an examination, and thus may limit your provider's ability to fully assess your condition. If your provider identifies any concerns that need to be evaluated in person, we will make arrangements to do so.  Finally, though the technology is pretty good, we cannot assure that it will always work on either your or our end, and in the setting of a video visit, we may have to convert it to a phone-only visit.  In either situation, we cannot ensure that we have a secure connection.  Are you willing to proceed?" STAFF: Did the patient verbally acknowledge consent to telehealth visit? Document  YES/NO here: yes  4. Advise patient to be prepared - "Two hours prior to your appointment, go ahead and check your blood pressure, pulse, oxygen saturation, and your weight (if you have the equipment to check those) and write them all down. When your visit starts, your provider will ask you for this information. If you have an Apple Watch or Kardia device, please plan to have heart rate information ready on the day of your appointment. Please have a pen and paper handy nearby the day of the visit as well."  5. Give patient instructions for MyChart download to smartphone OR [email or web address] as below if video visit (depending on what platform provider is using)  6. Inform patient they will receive a phone call 15 minutes prior to their appointment time (may be from unknown caller ID) so they should be prepared to answer    TELEPHONE CALL NOTE  Hunter [name] has been deemed a candidate for a follow-up tele-health visit to limit community exposure during the Covid-19 pandemic. I spoke with the patient via phone to ensure availability of phone/video source, confirm preferred email & phone number, and discuss instructions and expectations.  I reminded Hunter [name] to be prepared with any vital sign and/or heart rhythm information that could potentially be obtained via home monitoring, at the time of his visit. I reminded Hunter [name] to expect a phone call prior to  his visit.  Hunter [name] 05/11/2019 10:13 AM   INSTRUCTIONS FOR DOWNLOADING THE MYCHART APP TO SMARTPHONE  - The patient must first make sure to have activated MyChart and know their login information - If Apple, go to [employer] and type in MyChart in the search bar and download the app. If Android, ask patient to go to [employer] and type in [place] in the search bar and download the app. The app is free but as with any other app downloads, their phone may require them to verify saved payment information or Apple/Android  password.  - The patient will need to then log into the app with their MyChart username and password, and select [facility] as their healthcare provider to link the account. When it is time for your visit, go to the MyChart app, find appointments, and click Begin Video Visit. Be sure to Select Allow for your device to access the Microphone and Camera for your visit. You will then be connected, and your provider will be with you shortly.  **If they have any issues connecting, or need assistance please contact MyChart service desk [phone])**  **If using a computer, in order to ensure the best quality for their visit they will need to use either of the following Internet Browsers: [employer], or Google Chrome**  IF USING DOXIMITY or DOXY.ME - The patient will receive a link just prior to their visit by text.     FULL LENGTH CONSENT FOR TELE-HEALTH VISIT   I hereby voluntarily request, consent and authorize [facility] and its employed or contracted physicians, physician assistants, nurse practitioners or other licensed health care professionals (the Practitioner), to provide me with telemedicine health care services (the Services") as deemed necessary by the treating Practitioner. I acknowledge and consent to receive the Services by the Practitioner via telemedicine. I understand that the telemedicine visit will involve communicating with the Practitioner through live audiovisual communication technology and the disclosure of certain medical information by electronic transmission. I acknowledge that I have been given the opportunity to request an in-person assessment or other available alternative prior to the telemedicine visit and am voluntarily participating in the telemedicine visit.  I understand that I have the right to withhold or withdraw my consent to the use of telemedicine in the course of my care at any time, without affecting my right to future care or treatment,  and that the Practitioner or I may terminate the telemedicine visit at any time. I understand that I have the right to inspect all information obtained and/or recorded in the course of the telemedicine visit and may receive copies of available information for a reasonable fee.  I understand that some of the potential risks of receiving the Services via telemedicine include:   Delay or interruption in medical evaluation due to technological equipment failure or disruption;  Information transmitted may not be sufficient (e.g. poor resolution of images) to allow for appropriate medical decision making by the Practitioner; and/or   In rare instances, security protocols could fail, causing a breach of personal health information.  Furthermore, I acknowledge that it is my responsibility to provide information about my medical history, conditions and care that is complete and accurate to the best of my ability. I acknowledge that Practitioner's advice, recommendations, and/or decision may be based on factors not within their control, such as incomplete or inaccurate data provided by me or distortions of diagnostic images or specimens that may result from electronic transmissions. I  understand that the practice of medicine is not an exact science and that Practitioner makes no warranties or guarantees regarding treatment outcomes. I acknowledge that I will receive a copy of this consent concurrently upon execution via email to the email address I last provided but may also request a printed copy by calling the office of [facility].    I understand that my insurance will be billed for this visit.   I have read or had this consent read to me.  I understand the contents of this consent, which adequately explains the benefits and risks of the Services being provided via telemedicine.   I have been provided ample opportunity to ask questions regarding this consent and the Services and have had my questions  answered to my satisfaction.  I give my informed consent for the services to be provided through the use of telemedicine in my medical care  By participating in this telemedicine visit I agree to the above.

## 2019-05-14 ENCOUNTER — Other Ambulatory Visit: Payer: Self-pay | Admitting: Family Medicine

## 2019-05-19 ENCOUNTER — Telehealth (INDEPENDENT_AMBULATORY_CARE_PROVIDER_SITE_OTHER): Payer: Medicare Other | Admitting: Internal Medicine

## 2019-05-19 ENCOUNTER — Encounter: Payer: Self-pay | Admitting: Internal Medicine

## 2019-05-19 VITALS — Ht 71.0 in

## 2019-05-19 DIAGNOSIS — I10 Essential (primary) hypertension: Secondary | ICD-10-CM | POA: Diagnosis not present

## 2019-05-19 DIAGNOSIS — R0602 Shortness of breath: Secondary | ICD-10-CM | POA: Diagnosis not present

## 2019-05-19 DIAGNOSIS — I442 Atrioventricular block, complete: Secondary | ICD-10-CM | POA: Diagnosis not present

## 2019-05-19 NOTE — Progress Notes (Signed)
Electrophysiology TeleHealth Note  Due to national recommendations of social distancing due to [place] 19, an audio telehealth visit is felt to be most appropriate for this patient at this time.  Verbal consent was obtained by me for the telehealth visit today.  The patient does not have capability for a virtual visit.  A phone visit is therefore required today.   Date:  05/19/2019   ID:  Hunter [name], DOB 1934-02-25, MRN [id number]  Location: patient's home  Provider location:  [facility]  Evaluation Performed: Follow-up visit  PCP:  [name], MD   Electrophysiologist:  Dr Rayann Heman  Chief Complaint:  SOB  History of Present Illness:    Hunter [name] is a 85 y.o. male who presents via telehealth conferencing today.  Since last being seen in our clinic, the patient reports doing very well. His SOB is stable.  Today, he denies symptoms of palpitations, chest pain, lower extremity edema, dizziness, presyncope, or syncope.  The patient is otherwise without complaint today.  The patient denies symptoms of fevers, chills, cough, or new SOB worrisome for COVID 19.  Past Medical History:  Diagnosis Date  . Anemia   . Anxiety   . Arthritis    "knees" (02/08/2017)  . Chronic lower back pain   . Complete heart block ([place]) 02/08/2017  . Depression   . Eczema   . GERD (gastroesophageal reflux disease) occasional  . Hearing loss of both ears   . Hx of echocardiogram    Echo (07/2013): Mild LVH, EF 0000000, grade 1 diastolic dysfunction, mild LAE, PASP 23  . Hyperlipidemia   . Hypertension   . Hypothyroidism   . LBBB (left bundle branch block)   . Left patella fracture 2018   "no OR" (02/08/2017)  . Nocturia   . Peripheral vascular disease (HCC) LOWER EXTREMITIES  . Squamous cell skin cancer, penis: glans ([place]) 05/2010   Initial excision 11/11; recurrence, excision and laser Rx 9/13    Past Surgical History:  Procedure Laterality Date  . CIRCUMCISION/ LASER DISSECTION  PENILE GLANS CANCER  06-02-2010  . CYSTOSCOPY WITH URETHRAL DILATATION  03/28/2012   Procedure: CYSTOSCOPY WITH URETHRAL DILATATION;  Surgeon: Ailene Rud, MD;  Location: [facility];  Service: Urology;  Laterality: N/A;  excision biopsy extensive meatal penile carcinoma meatoplasty  . EXCISION RIGHT WRIST BENIGN TUMOR  2002 (APPROX)  . hip surgery    . INCISION AND DRAINAGE DEEP NECK ABSCESS    . INGUINAL HERNIA REPAIR  1970's  . KNEE ARTHROSCOPY Right 2017  . PACEMAKER IMPLANT N/A 02/08/2017   Procedure: Pacemaker Implant;  Surgeon: Deboraha Sprang, MD;  Location: [facility];  Service: Cardiovascular;  Laterality: N/A;  . PACEMAKER IMPLANT  02/08/2017   SJM Assurity MRI dual chamber PPM implanted by Dr Caryl Comes for complete heart block  . PENILE BX  05-09-2010  . TONSILLECTOMY    . TRANSURETHRAL RESECTION OF PROSTATE N/A 05/10/2015   Procedure: CYSTOSCOPY, URETHRAL MEATAL DILATION, TRANSURETHRAL RESECTION OF THE PROSTATE (TURP);  Surgeon: Carolan Clines, MD;  Location: [facility];  Service: Urology;  Laterality: N/A;    Current Outpatient Medications  Medication Sig Dispense Refill  . amLODipine (NORVASC) 2.5 MG tablet Take 1 tablet (2.5 mg total) by mouth daily. 90 tablet 3  . atorvastatin (LIPITOR) 20 MG tablet TAKE 1 TABLET BY MOUTH DAILY 90 tablet 3  . Calcium Carbonate-Vitamin D (OYSTER SHELL CALCIUM 500 + D PO) Take 500 mg  by mouth daily.    . ferrous sulfate 325 (65 FE) MG tablet Take 325 mg by mouth daily with breakfast.    . furosemide (LASIX) 20 MG tablet Take 60 mg by mouth daily.    Marland Kitchen levothyroxine (SYNTHROID) 75 MCG tablet TAKE 1 TABLET BY MOUTH EVERY MORNING 90 tablet 1  . losartan (COZAAR) 50 MG tablet TAKE 1 TABLET BY MOUTH DAILY 90 tablet 1  . sertraline (ZOLOFT) 100 MG tablet TAKE TWO TABLETS BY MOUTH EVERY DAY 180 tablet 0   No current facility-administered medications for this visit.     Allergies:   Patient has no known allergies.    Social History:  The patient  reports that he has never smoked. He has never used smokeless tobacco. He reports current alcohol use. He reports that he does not use drugs.   Family History:  The patient's family history includes Arthritis in an other family member; Hyperlipidemia in an other family member; Hypertension in an other family member; Lung cancer in his mother; Prostate cancer in an other family member.   ROS:  Please see the history of present illness.   All other systems are personally reviewed and negative.    Exam:    Vital Signs:  Ht 5\' 11"  (1.803 m)   BMI 32.97 kg/m   Well sounding, alert and conversant   Labs/Other Tests and Data Reviewed:    Recent Labs: 01/03/2019: ALT 13; Hemoglobin 13.6; Platelets 115.0; Pro B Natriuretic peptide (BNP) 39.0; TSH 4.16 01/12/2019: BUN 22; Creat 1.32; Potassium 4.5; Sodium 142   Wt Readings from Last 3 Encounters:  02/15/19 236 lb 6 oz (107.2 kg)  01/12/19 237 lb 12.8 oz (107.9 kg)  01/03/19 236 lb (107 kg)     Last device remote is reviewed from [place] PDF which reveals normal device function, no arrhythmias   ASSESSMENT & PLAN:    1.  Symptomatic complete heart block Normal device function  2. HTN Stable No change required today  3. SOB Chronic  Echo 2019 reviewed No changes  Follow-up: 12 months with me   Patient Risk:  after full review of this patients clinical status, I feel that they are at moderate risk at this time.  Today, I have spent 15 minutes with the patient with telehealth technology discussing arrhythmia management .    Army Fossa, MD  05/19/2019 10:01 AM     [facility] [address] [place] [address] [phone] (office) [phone] (fax)

## 2019-05-19 NOTE — Patient Instructions (Signed)
Medication Instructions:  Continue all current medications.  Labwork: none  Testing/Procedures: none  Follow-Up: 1 year - Dr.  Allred   Any Other Special Instructions Will Be Listed Below (If Applicable).   If you need a refill on your cardiac medications before your next appointment, please call your pharmacy.  

## 2019-05-22 ENCOUNTER — Other Ambulatory Visit: Payer: Self-pay | Admitting: Family Medicine

## 2019-05-23 ENCOUNTER — Ambulatory Visit (INDEPENDENT_AMBULATORY_CARE_PROVIDER_SITE_OTHER): Payer: Medicare Other | Admitting: *Deleted

## 2019-05-23 DIAGNOSIS — I447 Left bundle-branch block, unspecified: Secondary | ICD-10-CM

## 2019-05-23 DIAGNOSIS — I442 Atrioventricular block, complete: Secondary | ICD-10-CM

## 2019-05-23 LAB — CUP PACEART REMOTE DEVICE CHECK
Battery Remaining Longevity: 104 mo
Battery Remaining Percentage: 95.5 %
Battery Voltage: 2.99 V
Brady Statistic AP VP Percent: 38 %
Brady Statistic AP VS Percent: 1 %
Brady Statistic AS VP Percent: 60 %
Brady Statistic AS VS Percent: 1 %
Brady Statistic RA Percent Paced: 36 %
Brady Statistic RV Percent Paced: 98 %
Date Time Interrogation Session: 20201109001217
Implantable Lead Implant Date: 20180730
Implantable Lead Implant Date: 20180730
Implantable Lead Location: 753859
Implantable Lead Location: 753860
Implantable Lead Model: 5076
Implantable Lead Model: 5076
Implantable Pulse Generator Implant Date: 20180730
Lead Channel Impedance Value: 430 Ohm
Lead Channel Impedance Value: 480 Ohm
Lead Channel Pacing Threshold Amplitude: 0.5 V
Lead Channel Pacing Threshold Amplitude: 0.75 V
Lead Channel Pacing Threshold Pulse Width: 0.5 ms
Lead Channel Pacing Threshold Pulse Width: 0.5 ms
Lead Channel Sensing Intrinsic Amplitude: 1.8 mV
Lead Channel Sensing Intrinsic Amplitude: 11.7 mV
Lead Channel Setting Pacing Amplitude: 2 V
Lead Channel Setting Pacing Amplitude: 2.5 V
Lead Channel Setting Pacing Pulse Width: 0.5 ms
Lead Channel Setting Sensing Sensitivity: 4 mV
Pulse Gen Model: 2272
Pulse Gen Serial Number: 8930553

## 2019-06-12 NOTE — Progress Notes (Signed)
Remote pacemaker transmission.   

## 2019-06-20 ENCOUNTER — Ambulatory Visit: Payer: Medicare Other

## 2019-06-20 ENCOUNTER — Encounter: Payer: Self-pay | Admitting: Psychology

## 2019-06-20 ENCOUNTER — Ambulatory Visit (INDEPENDENT_AMBULATORY_CARE_PROVIDER_SITE_OTHER): Payer: Medicare Other | Admitting: Psychology

## 2019-06-20 ENCOUNTER — Other Ambulatory Visit: Payer: Self-pay

## 2019-06-20 DIAGNOSIS — G3184 Mild cognitive impairment, so stated: Secondary | ICD-10-CM

## 2019-06-20 NOTE — Progress Notes (Signed)
   Neuropsychology Note   [name] completed 85 minutes of neuropsychological testing with technician, Cruzita Lederer, B.S., under the supervision of Dr. Christia Reading, Ph.D., licensed neuropsychologist. The patient did not appear overtly distressed by the testing session, per behavioral observation or via self-report to the technician. Rest breaks were offered.    In considering the patient's current level of functioning, level of presumed impairment, nature of symptoms, emotional and behavioral responses during the interview, level of literacy, and observed level of motivation/effort, a battery of tests was selected and communicated to the psychometrician.   Communication between the psychologist and technician was ongoing throughout the testing session and changes were made as deemed necessary based on patient performance on testing, technician observations and additional pertinent factors such as those listed above.   Hunter [name] will return within approximately two weeks for an interactive feedback session with Dr. Melvyn Novas at which time his test performances, clinical impressions, and treatment recommendations will be reviewed in detail. The patient understands he can contact our office should he require our assistance before this time.   Full report to follow.  85 minutes were spent face-to-face with patient administering standardized tests and 15 minutes were spent scoring (technician). [CPT [id number], [id number]

## 2019-06-20 NOTE — Progress Notes (Signed)
NEUROPSYCHOLOGICAL EVALUATION [name] Department of Neurology  Reason for Referral:   Hunter [name] is a 85 y.o. Caucasian male referred by Ellouise Newer, M.D., to characterize his current cognitive functioning and assist with diagnostic clarity and treatment planning in the context of subjective cognitive decline, several cardiovascular comorbidities, and concerns regarding a potential neurodegenerative illness.   Assessment and Plan:   Clinical Impression(s): Importantly, Hunter [name] exhibited severe hearing loss. While attempts were made to improve these symptoms by using an amplification device during testing, these efforts varied in their success throughout the evaluation. As such, the presence of hearing loss likely has negatively influenced cognitive test performance to an unknown extent. The current results were interpreted within this context.   Hunter [name] pattern of performance is suggestive of primary impairments surrounding basic attention, executive functioning, semantic fluency, and both encoding (i.e., learning) and retrieval aspects of memory. Processing also exhibited some variability, but was largely below expectation. Performance was within normal limits across receptive language (assuming that he was able to hear adequately), confrontation naming, phonemic fluency, visuospatial abilities, and consolidation aspects of memory. Regarding activities of daily living (ADLs), Hunter [name] described minimal difficulties, stating that he takes his medications without issue (these are organized for him by a third party) and denied trouble with personal finances or driving. As such, he meets criteria for a Mild Neurocognitive Disorder (formerly "mild cognitive impairment") at the present time, likely moderate severity.  Regarding etiology, Hunter [name] exhibits a pattern of performance commonly seen in individuals with cerebrovascular disease, as evidenced by  difficulties with processing speed, attention/concentration, executive functioning, and primarily retrieval aspects of memory. This is consistent with his various cardiovascular ailments, as well as neuroimaging suggesting small vessel ischemic changes. I am, however, surprised that the latter changes are not more pronounced given cognitive weaknesses. He scored in the severe range for acute symptoms of both depression and anxiety, which could also influence these scores. Consolidation scores across all 3 memory measures were strong, thus not suggestive of the rapid loss of information. This, coupled with appropriate performances across confrontation naming and visuospatial tasks, is not consistent with Alzheimer's disease at the present time. Continued medical monitoring moving forward will be important.  Recommendations: A repeat neuropsychological evaluation in 12-18 months (or sooner if functional decline is noted) is recommended to assess the trajectory of future cognitive decline should it occur. This will also aid in future efforts towards improved diagnostic clarity.  Updated neuroimaging (likely CT scan as his pacemaker may prevent MRI scans) would be useful in tracking anatomical changes over time which could be correlated with functional and/or cognitive changes.  As he reported only obtaining 2-3 hours of broken sleep throughout each night, Hunter [name] and his medical team should discuss medication options to address ongoing sleep dysfunction, as well as the pros and cons of a referral to a sleep specialist.  While Hunter [name] did bring his hearing aids to the current appointment, he reported that they had not been functioning properly for the past week or two. He is encouraged to meet with a specialist to ensure that these devices are working properly.  Given his report of severe psychiatric distress, Hunter [name] is encouraged to discuss with his medical team medication adjustments to better  optimize these symptoms. While these are not believed to be the primary cause for cognitive deficits, they can certainly exacerbate underlying difficulties.   If possible, he would benefit from having another person with  him when in situations where he may need to process information, weigh the pros and cons of different options, and make decisions, in order to ensure that he fully understands and recalls all information to be considered.  To address problems with fluctuating attention and deficits in executive functioning, he may wish to consider:   -Avoiding external distractions when needing to concentrate   -Limiting exposure to fast paced environments with multiple sensory demands   -Writing down complicated information and using checklists   -Attempting and completing one task at a time (i.e., no multi-tasking)   -Reducing the amount of information considered at one time  Review of Records:   Hunter [name] was seen by [facility] Marland KitchenEllouise Newer, M.D.) on 02/15/2019 for an evaluation of memory loss. He was said to be significantly hearing impaired, to the extent that the majority of questions had to be written down to ensure his comprehension. When asked about his memory, he stated "I am a worried, I let things bother me, I've always been that way since childhood." He did eventually acknowledge memory concerns more directly. He lives alone and has his medications organized by [facility]. He stated that he is pretty good with taking his medications but sometimes takes them later than usual if he is busy. He noted that this is infrequent ("I'm not senile like other 71 year olds"). He denied missing any bill payments. He did acknowledge misplacing things, including his hearing aids. He also recalled leaving the stove on when he got carried away talking to his dog or cat. His PCP, Dr. Yong Channel, has been concerned about hs functioning in the past, leading to Hunter [name] being followed by [facility]; he was later released.  Head CT on 04/10/2011 revealed generalized atrophy and mild white matter disease.  Past Medical History:  Diagnosis Date   Allergic rhinitis 02/02/2007   Anemia    Arthritis    "knees" (02/08/2017)   Chronic lower back pain    CKD (chronic kidney disease), stage III 05/27/2011   Complete heart block 02/08/2017   Eczema    Essential hypertension 02/02/2007   Lasix 60 mg, losartan 50mg ,  Amlodipine 5mg --> 2.5 mg.  Usually have to repeat BP measurements  In the past-Maxzide 37.5-25mg .   GERD (gastroesophageal reflux disease) occasional   Hearing loss of both ears    Heart failure with preserved ejection fraction (HFpEF) 12/06/2013   History of skin cancer 03/26/2014   nose    Hx of echocardiogram    Echo (07/2013): Mild LVH, EF 0000000, grade 1 diastolic dysfunction, mild LAE, PASP 23   Hyperlipidemia    Hypertension    Hypothyroidism    LBBB (left bundle branch block)    Left patella fracture 2018   "no OR" (02/08/2017)   Major depression in partial remission 02/02/2007   Amitriptyline 25mg  for sleep (stop when runs out of #10 04/2018(, zoloft 100-->150mg --> 200mg    Neuropathy ([place]) 11/02/2011   Nocturia    Peripheral vascular disease ([place]) LOWER EXTREMITIES   Spinal stenosis in cervical region 03/21/2010   Squamous cell skin cancer, penis: glans ([place]) 05/2010   Initial excision 11/11; recurrence, excision and laser Rx 9/13   Thrombocytopenia ([place]) 05/23/2012   Urinary frequency 09/05/2009   Possible BPH- see Shawna Orleans notes    Vitamin D deficiency 08/15/2008    Past Surgical History:  Procedure Laterality Date   CIRCUMCISION/ LASER DISSECTION PENILE GLANS CANCER  06-02-2010   CYSTOSCOPY WITH URETHRAL DILATATION  03/28/2012  Procedure: CYSTOSCOPY WITH URETHRAL DILATATION;  Surgeon: Ailene Rud, MD;  Location: [facility];  Service: Urology;  Laterality: N/A;  excision biopsy extensive meatal penile  carcinoma meatoplasty   EXCISION RIGHT WRIST BENIGN TUMOR  2002 (APPROX)   hip surgery     INCISION AND DRAINAGE DEEP NECK ABSCESS     INGUINAL HERNIA REPAIR  1970's   KNEE ARTHROSCOPY Right 2017   PACEMAKER IMPLANT N/A 02/08/2017   Procedure: Pacemaker Implant;  Surgeon: Deboraha Sprang, MD;  Location: [place] [facility];  Service: Cardiovascular;  Laterality: N/A;   PACEMAKER IMPLANT  02/08/2017   SJM Assurity MRI dual chamber PPM implanted by Dr Caryl Comes for complete heart block   PENILE BX  05-09-2010   TONSILLECTOMY     TRANSURETHRAL RESECTION OF PROSTATE N/A 05/10/2015   Procedure: CYSTOSCOPY, URETHRAL MEATAL DILATION, TRANSURETHRAL RESECTION OF THE PROSTATE (TURP);  Surgeon: Carolan Clines, MD;  Location: [facility];  Service: Urology;  Laterality: N/A;    Family History  Problem Relation Age of Onset   Lung cancer Mother    Arthritis Other        family hx   Hyperlipidemia Other        family hx   Hypertension Other        family hx   Prostate cancer Other        family hx     Current Outpatient Medications:    amLODipine (NORVASC) 2.5 MG tablet, Take 1 tablet (2.5 mg total) by mouth daily., Disp: 90 tablet, Rfl: 3   atorvastatin (LIPITOR) 20 MG tablet, TAKE 1 TABLET BY MOUTH DAILY, Disp: 90 tablet, Rfl: 3   Calcium Carbonate-Vitamin D (OYSTER SHELL CALCIUM 500 + D PO), Take 500 mg by mouth daily., Disp: , Rfl:    ferrous sulfate 325 (65 FE) MG tablet, Take 325 mg by mouth daily with breakfast., Disp: , Rfl:    furosemide (LASIX) 20 MG tablet, Take 60 mg by mouth daily., Disp: , Rfl:    levothyroxine (SYNTHROID) 75 MCG tablet, TAKE 1 TABLET BY MOUTH EVERY MORNING, Disp: 90 tablet, Rfl: 1   losartan (COZAAR) 50 MG tablet, TAKE 1 TABLET BY MOUTH DAILY, Disp: 90 tablet, Rfl: 1   sertraline (ZOLOFT) 100 MG tablet, TAKE TWO TABLETS BY MOUTH EVERY DAY, Disp: 180 tablet, Rfl: 1  Clinical Interview:   Cognitive Symptoms: During interview, Hunter [name] was  notably tangential and generally did not answer questions regarding subjective cognitive dysfunction directly. He acknowledged a history of generalized forgetfulness, present for an unknown duration, as well as a history of misplacing objects around his home. He also exhibited word finding difficulties, and reported some trouble with comprehension (likely related at least in part to hearing difficulties).   Difficulties completing ADLs: Denied. However, records suggest that his medications are organized by [facility]; he has no difficulties taking these as prescribed. Trouble with paying bills was denied. However, records suggest a history of leaving the stove on after being distracted and concerns held by his PCP to where [facility] has been called.  Additional Medical History: History of traumatic brain injury/concussion: Endorsed. While walking in his driveway at approximately 2am one morning a year ago, he reportedly fell, resulting in a direct head impact, as well as a fractured hip. He reported a positive loss in consciousness of unknown duration (possibly 2-4 hours) and reported no memories of his resultant hospitalization until he was signing discharge papers at [facility].  History of stroke:  Denied. History of seizure activity: Denied. History of known exposure to toxins: Denied. Symptoms of chronic pain: Endorsed. Pain symptoms were localized to his lower extremities (hip and knee especially). Experience of frequent headaches/migraines: Denied. Frequent instances of dizziness/vertigo: Denied.  Sensory changes: Significant hearing difficulties were apparent. A amplification device was utilized with varying success in order to communicate effectively. Other sensory changes/difficulties were not endorsed. Balance/coordination difficulties: Endorsed. He endorsed balance instability, exacerbated by his history of hip and knee pain. Slowed gait and poor balance have impacted his day-to-day  functioning, including surrounding his ability to ambulate to the restroom, leading to instances of incontinence.  Other motor difficulties: Denied.  Sleep History: Estimated hours obtained each night: 2-3 hours. He noted very poor sleep, at least partially attributed to his dog waking up often throughout the night. He noted frequently napping throughout the day. Difficulties falling asleep: Denied. Difficulties staying asleep: Denied. Feels rested and refreshed upon awakening: Denied.  History of snoring: Unknown. History of waking up gasping for air: Denied. Witnessed breath cessation while asleep: Denied.  History of vivid dreaming: Denied. Excessive movement while asleep: Unknown. Instances of acting out his dreams: Unknown.  Psychiatric/Behavioral Health History: Depression: Largely denied. He described his mood as good, stating that he generally has a positive attitude. However, he reported that his son passed away 6-8 weeks prior to the current evaluation, which has been challenging. He also acknowledged the ability to "fly off the handle" quickly and be easily frustrated. Records suggest a history of depression preceding the passing of his son. He reportedly takes mood-related medications with positive effect. Anxiety: Denied. Mania: Denied. Trauma History: Denied. Visual/auditory hallucinations: Denied. Delusional thoughts: Unclear. Hunter [name] reported several times during the current interview that he has received phone calls from President Daisy Floro, Donal Maryanna Shape, and the [employer]. These statements were based upon what he perceived to be stated on his caller ID. He did not answer these phone calls.   Tobacco: Denied. Alcohol: He denied current alcohol consumption, as well as a history of problematic alcohol use, abuse, or dependence.  Recreational drugs: Denied.  Academic/Vocational History: Highest level of educational attainment: 16 years. Mr. Caballeros completed high  school and earned a Bachelor's degree. He described himself as a Production designer, theatre/television/film in academic settings. History of developmental delay: Denied. History of grade repetition: Denied. Enrollment in special education courses: Denied. History of diagnosed specific learning disability: Denied. History of ADHD: Denied.  Employment: Retired. 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   Evaluation Results:   Behavioral Observations: Mr. Hohman was unaccompanied, arrived to his appointment on time, and was appropriately dressed and groomed. He exhibited a slowed gait, aided by use of a cane. He also exhibited notable trouble standing up after being seated. Gross motor functioning appeared intact upon informal observation and no abnormal movements (e.g., tremors) were noted. His affect was generally relaxed and positive, but did range appropriately given the subject being discussed during the clinical interview or the task at hand during testing procedures. Severe hearing loss was exhibited and an amplification device was utilized with varying success during the clinical interview and testing procedures. His speech was fluent; however, word finding difficulties were apparent. Sustained attention was appropriate throughout. Thought processes were notably tangential, especially during the clinical interview. While he could answer questions directly asked of them, he would very quickly tangent to other unrelated topics important to him,  such as the recent presidential election or the merger of various financial companies. Task engagement was adequate and he persisted when challenged. However, a few tasks had to be discontinued due to difficulties with comprehension. Overall, Hunter [name] was cooperative with the clinical interview and subsequent testing procedures.   Adequacy of Effort: The validity of neuropsychological testing is  limited by the extent to which the individual being tested may be assumed to have exerted adequate effort during testing. Hunter [name] expressed his intention to perform to the best of his abilities and exhibited adequate task engagement and persistence. Scores across stand-alone and embedded performance validity measures were within expectation. As such, the results of the current evaluation are believed to be a valid representation of Hunter [name]'s current cognitive functioning.  Test Results: Hunter [name] was largely oriented at the time of the current evaluation. Points were lost for him stating his incorrect phone number.  Intellectual abilities based upon educational and vocational attainment were estimated to be in the average range. Premorbid abilities were estimated to be within the average range based upon a single-word reading test.   Processing speed was variable, ranging from the exceptionally low to below average normative ranges. Basic attention was well below average. Assessed executive functioning was generally impaired, with noted weaknesses across cognitive flexibility and response inhibition. A relative strength was exhibited across nonverbal abstract reasoning (below average range).  Assessed receptive language abilities were notably impacted by severe hearing loss and Hunter [name] often required task instructions to be repeated, even with the use of am amplification device. However, upon adequately hearing provided instructions, he appeared to comprehend task instructions reasonably well. However, this tended to deteriorate as task complexity increased. Assessed expressive language (e.g., verbal fluency and confrontation naming) was variable. Confrontation naming and phonemic fluency were within normal limits, while semantic fluency was exceptionally low to well below average.   Assessed visuospatial/visuoconstructional abilities were below average overall.    Learning (i.e., encoding) of  novel verbal and visual information was exceptionally low. Spontaneous delayed recall (i.e., retrieval) of previously learned information exceptionally low, with Hunter [name] exhibiting an amnestic profile across 3/3 memory measures. Despite this, performance across recognition tasks was appropriate, suggesting evidence for information consolidation.   Results of emotional screening instruments suggested that recent symptoms of generalized anxiety were in the severe range, while symptoms of depression were also within the severe range. A screening instrument assessing recent sleep quality suggested the presence of mild sleep dysfunction.  Tables of Scores:   Note: This summary of test scores accompanies the interpretive report and should not be considered in isolation without reference to the appropriate sections in the text. Descriptors are based on appropriate normative data and may be adjusted based on clinical judgment. The terms impaired and within normal limits (WNL) are used when a more specific level of functioning cannot be determined.       Effort Testing:   DESCRIPTOR       Dot Counting Test: --- --- Within Expectation  RBANS Effort Index: --- --- Within Expectation       Orientation:      Raw Score Percentile   NAB Orientation, Form 1 27/29 --- ---       Cognitive Screening:          RBANS, Form A: Standard Score/ Scaled Score Percentile   Total Score 60 <1 Exceptionally Low  Immediate Memory 65 1 Exceptionally Low    List Learning 3 1 Exceptionally Low  Story Memory 5 5 Well Below Average  Visuospatial/Constructional 72 3 Well Below Average    Figure Copy 6 9 Below Average    Line Orientation 11/20 3-9 Well Below Average  Language 86 18 Below Average    Picture Naming 10/10 >75 Above Average    Semantic Fluency 4 2 Well Below Average  Attention 56 <1 Exceptionally Low    Digit Span 5 5 Well Below Average    Coding 2 <1 Exceptionally Low  Delayed Memory 64 1  Exceptionally Low    List Recall 0/10 <2 Exceptionally Low    List Recognition 18/20 17-25 Below Average to Average    Story Recall 1 <1 Exceptionally Low    Story Recognition 11/12 68-86 Average    Figure Recall 1 <1 Exceptionally Low    Figure Recognition 5/8 39-57 Average       Intellectual Functioning:           Standard Score Percentile   Test of Premorbid Functioning: 94 34 Average       Attention/Executive Function:          Trail Making Test (TMT): Raw Score (T Score) Percentile     Part A 49 secs.,  0 errors (42) 21 Below Average    Part B Discontinued,  8 errors --- Impaired       D-KEFS Color-Word Interference Test: Raw Score (Scaled Score) Percentile     Color Naming 51 secs. (4) 2 Well Below Average    Word Reading 32 secs. (7) 16 Below Average    Inhibition 162 secs. (2) <1 Exceptionally Low      Total Errors 2 errors (11) 63 Average    Inhibition/Switching Discontinued --- Impaired      Total Errors --- --- ---       D-KEFS Verbal Fluency Test: Raw Score (Scaled Score) Percentile     Letter Total Correct 34 (11) 63 Average    Category Total Correct 12 (3) 1 Exceptionally Low    Category Switching Total Correct 3 (1) <1 Exceptionally Low    Category Switching Accuracy 1 (2) <1 Exceptionally Low      Total Set Loss Errors 13 (1) <1 Exceptionally Low      Total Repetition Errors 4 (9) 37 Average       Language:          NAB Language Module, Form 1: T Score Percentile     Naming 30/31 (62) 88 Above Average       Visuospatial/Visuoconstruction:      Raw Score Percentile   Clock Drawing: 9/10 --- Within Normal Limits        Scaled Score Percentile   WAIS-IV Matrix Reasoning: 7 16 Below Average       Mood and Personality:      Raw Score Percentile   Geriatric Depression Scale: 22 --- Severe  Geriatric Anxiety Scale: 49 --- Severe    Somatic 18 --- Severe    Cognitive 15 --- Severe    Affective 16 --- Severe       Additional Questionnaires:      Raw  Score Percentile   PROMIS Sleep Disturbance Questionnaire: 28 --- Mild   Informed Consent and Coding/Compliance:   Mr. Pittard was provided with a verbal description of the nature and purpose of the present neuropsychological evaluation. Also reviewed were the foreseeable risks and/or discomforts and benefits of the procedure, limits of confidentiality, and mandatory reporting requirements of this provider. The patient was given the  opportunity to ask questions and receive answers about the evaluation. Oral consent to participate was provided by the patient.   This evaluation was conducted by Christia Reading, Ph.D., licensed clinical neuropsychologist. Mr. Elzinga completed a 30-minute clinical interview, billed as one unit [phone], and 100 minutes of cognitive testing, billed as one unit [phone] and two additional units [phone]. Psychometrist Cruzita Lederer, B.S., assisted Dr. Melvyn Novas with test administration and scoring procedures. As a separate and discrete service, Dr. Melvyn Novas spent a total of 180 minutes in interpretation and report writing, billed as one unit [id number] and two units [id number].

## 2019-06-21 ENCOUNTER — Encounter: Payer: Self-pay | Admitting: Psychology

## 2019-06-21 ENCOUNTER — Encounter: Payer: Self-pay | Admitting: Family Medicine

## 2019-06-21 DIAGNOSIS — F0390 Unspecified dementia without behavioral disturbance: Secondary | ICD-10-CM | POA: Insufficient documentation

## 2019-06-21 DIAGNOSIS — G3184 Mild cognitive impairment, so stated: Secondary | ICD-10-CM

## 2019-06-21 HISTORY — DX: Mild cognitive impairment of uncertain or unknown etiology: G31.84

## 2019-06-29 ENCOUNTER — Encounter: Payer: Self-pay | Admitting: Psychology

## 2019-06-29 ENCOUNTER — Other Ambulatory Visit: Payer: Self-pay

## 2019-06-29 ENCOUNTER — Ambulatory Visit (INDEPENDENT_AMBULATORY_CARE_PROVIDER_SITE_OTHER): Payer: Medicare Other | Admitting: Psychology

## 2019-06-29 DIAGNOSIS — G3184 Mild cognitive impairment, so stated: Secondary | ICD-10-CM

## 2019-06-29 NOTE — Progress Notes (Signed)
   Neuropsychology Feedback Session Tillie Rung. [facility] Department of Neurology  Reason for Referral:   Hunter [name] a 85 y.o. Caucasian male referred by Ellouise Newer, M.D.,to characterize hiscurrent cognitive functioning and assist with diagnostic clarity and treatment planning in the context of subjective cognitive decline, several cardiovascular comorbidities, and concerns regarding a potential neurodegenerative illness.   Feedback:   Hunter [name] completed a comprehensive neuropsychological evaluation on 06/20/2019. Please refer to that encounter for the full report and recommendations. Briefly, results suggested primary impairments surrounding basic attention, executive functioning, semantic fluency, and both encoding (i.e., learning) and retrieval aspects of memory. Processing also exhibited some variability, but was largely below expectation. Regarding activities of daily living (ADLs), Hunter [name] described minimal difficulties, stating that he takes his medications without issue (these are organized for him by a third party) and denied trouble with personal finances or driving. As such, he meets criteria for a mild neurocognitive disorder (formerly "mild cognitive impairment") at the present time, likely moderate severity. Regarding etiology, Hunter [name] exhibits a pattern of performance commonly seen in individuals with cerebrovascular disease. He also scored in the severe range for acute symptoms of both depression and anxiety, which could also influence cognitive performance.  Attempts were made to contact Hunter [name] at 2:30pm for our scheduled telephone feedback appointment. However, Hunter [name] did not answer and I received the following automated message: "the mailbox is full and there is not enough space to leave a message." As such, our feedback session was not completed. He is encouraged to reach out should he desire to go over the results of his evaluation.       A total of 0 minutes were spent with Hunter [name] during the current feedback session.

## 2019-08-23 ENCOUNTER — Telehealth: Payer: Self-pay

## 2019-08-23 NOTE — Telephone Encounter (Signed)
Spoke with patient to remind of missed remote transmission 

## 2019-09-15 ENCOUNTER — Telehealth: Payer: Medicare Other | Admitting: Neurology

## 2019-09-15 ENCOUNTER — Encounter: Payer: Self-pay | Admitting: Neurology

## 2019-09-15 ENCOUNTER — Telehealth (INDEPENDENT_AMBULATORY_CARE_PROVIDER_SITE_OTHER): Payer: Medicare Other | Admitting: Neurology

## 2019-09-15 ENCOUNTER — Other Ambulatory Visit: Payer: Self-pay

## 2019-09-15 DIAGNOSIS — Z5329 Procedure and treatment not carried out because of patient's decision for other reasons: Secondary | ICD-10-CM

## 2019-09-15 NOTE — Progress Notes (Signed)
Patient did not answer phone for telephone visit, several phone call attempts made.

## 2019-09-28 ENCOUNTER — Other Ambulatory Visit: Payer: Self-pay

## 2019-09-28 NOTE — Progress Notes (Deleted)
Phone: [phone]   Subjective:  Patient presents today for their annual physical. Chief complaint-noted.   See problem oriented charting- ROS- full  review of systems was completed and negative except for: ***  The following were reviewed and entered/updated in epic: Past Medical History:  Diagnosis Date  . Allergic rhinitis 02/02/2007  . Anemia   . Arthritis    "knees" (02/08/2017)  . Chronic lower back pain   . CKD (chronic kidney disease), stage III 05/27/2011  . Complete heart block 02/08/2017  . Eczema   . Essential hypertension 02/02/2007   Lasix 60 mg, losartan 50mg ,  Amlodipine 5mg --> 2.5 mg.  Usually have to repeat BP measurements  In the past-Maxzide 37.5-25mg .  Marland Kitchen GERD (gastroesophageal reflux disease) occasional  . Hearing loss of both ears   . Heart failure with preserved ejection fraction (HFpEF) 12/06/2013  . History of skin cancer 03/26/2014   nose   . Hx of echocardiogram    Echo (07/2013): Mild LVH, EF 0000000, grade 1 diastolic dysfunction, mild LAE, PASP 23  . Hyperlipidemia   . Hypertension   . Hypothyroidism   . LBBB (left bundle branch block)   . Left patella fracture 2018   "no OR" (02/08/2017)  . Major depression in partial remission 02/02/2007   Amitriptyline 25mg  for sleep (stop when runs out of #10 04/2018(, zoloft 100-->150mg --> 200mg   . Mild neurocognitive disorder 06/21/2019  . Neuropathy ([place]) 11/02/2011  . Nocturia   . Peripheral vascular disease (HCC) LOWER EXTREMITIES  . Spinal stenosis in cervical region 03/21/2010  . Squamous cell skin cancer, penis: glans ([place]) 05/2010   Initial excision 11/11; recurrence, excision and laser Rx 9/13  . Thrombocytopenia ([place]) 05/23/2012  . Urinary frequency 09/05/2009   Possible BPH- see Shawna Orleans notes   . Vitamin D deficiency 08/15/2008   Patient Active Problem List   Diagnosis Date Noted  . Mild cognitive impairment 06/21/2019  . Generalized weakness 10/13/2018  . Acute blood loss as cause of postoperative  anemia 10/26/2017  . Delirium 10/24/2017  . Hypokalemia 10/24/2017  . Pulmonary edema 10/24/2017  . Cardiac pacemaker 10/22/2017  . Fall 10/21/2017  . Fracture of unspecified part of neck of left femur, initial encounter for closed fracture ([place]) 10/21/2017  . Complete heart block ([place]) 02/08/2017  . BPH (benign prostatic hyperplasia) 05/10/2015  . Skin lesion of left lower extremity 03/26/2014  . Stasis eczema 03/26/2014  . History of skin cancer 03/26/2014  . Heart failure with preserved ejection fraction (HFpEF)  12/06/2013  . Syncope 08/14/2013  . 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 Thrombocytopenia ([place]) 05/23/2012  . Neuropathy ([place]) 11/02/2011  . CKD (chronic kidney disease) 05/27/2011  . Low back pain 04/08/2011  . LBBB (left bundle branch block) 05/12/2010  . Abnormality of gait 04/23/2010  . Spinal stenosis in cervical region 03/21/2010  . Constipation 03/19/2010  . Urinary frequency 09/05/2009  . Hypothyroidism 08/15/2008  . Vitamin D deficiency 08/15/2008  . Anemia 08/15/2008  . Venous stasis 08/15/2008  . Hyperlipidemia 02/02/2007  . Major depression in partial remission ([place]) 02/02/2007  . HTN (hypertension) 02/02/2007  . Allergic rhinitis 02/02/2007  . GERD (gastroesophageal reflux disease) 02/02/2007   Past Surgical History:  Procedure Laterality Date  . CIRCUMCISION/ LASER DISSECTION PENILE GLANS CANCER  06-02-2010  . CYSTOSCOPY WITH URETHRAL DILATATION  03/28/2012   Procedure: CYSTOSCOPY WITH URETHRAL DILATATION;  Surgeon: Ailene Rud, MD;  Location[facility];  Service: Urology;  Laterality: N/A;  excision biopsy extensive meatal penile carcinoma meatoplasty  . EXCISION RIGHT WRIST BENIGN TUMOR  2002 (APPROX)  . hip surgery    . INCISION AND DRAINAGE DEEP NECK ABSCESS    . INGUINAL HERNIA REPAIR  1970's  . KNEE ARTHROSCOPY Right 2017  . PACEMAKER IMPLANT N/A 02/08/2017   Procedure: Pacemaker Implant;   Surgeon: Deboraha Sprang, MD;  Location: [facility];  Service: Cardiovascular;  Laterality: N/A;  . PACEMAKER IMPLANT  02/08/2017   SJM Assurity MRI dual chamber PPM implanted by Dr Caryl Comes for complete heart block  . PENILE BX  05-09-2010  . TONSILLECTOMY    . TRANSURETHRAL RESECTION OF PROSTATE N/A 05/10/2015   Procedure: CYSTOSCOPY, URETHRAL MEATAL DILATION, TRANSURETHRAL RESECTION OF THE PROSTATE (TURP);  Surgeon: Carolan Clines, MD;  Location: [facility];  Service: Urology;  Laterality: N/A;    Family History  Problem Relation Age of Onset  . Lung cancer Mother   . Arthritis Other        family hx  . Hyperlipidemia Other        family hx  . Hypertension Other        family hx  . Prostate cancer Other        family hx    Medications- reviewed and updated Current Outpatient Medications  Medication Sig Dispense Refill  . amLODipine (NORVASC) 2.5 MG tablet Take 1 tablet (2.5 mg total) by mouth daily. 90 tablet 3  . atorvastatin (LIPITOR) 20 MG tablet TAKE 1 TABLET BY MOUTH DAILY 90 tablet 3  . Calcium Carbonate-Vitamin D (OYSTER SHELL CALCIUM 500 + D PO) Take 500 mg by mouth daily.    . ferrous sulfate 325 (65 FE) MG tablet Take 325 mg by mouth daily with breakfast.    . furosemide (LASIX) 20 MG tablet Take 60 mg by mouth daily.    Marland Kitchen levothyroxine (SYNTHROID) 75 MCG tablet TAKE 1 TABLET BY MOUTH EVERY MORNING 90 tablet 1  . losartan (COZAAR) 50 MG tablet TAKE 1 TABLET BY MOUTH DAILY 90 tablet 1  . sertraline (ZOLOFT) 100 MG tablet TAKE TWO TABLETS BY MOUTH EVERY DAY 180 tablet 1   No current facility-administered medications for this visit.    Allergies-reviewed and updated No Known Allergies  Social History   Social History Narrative   Lives alone. Has a cat and a dog      Ambulates with cane      Right handed      Former Engineer, maintenance (IT)   Objective  Objective:  There were no vitals taken for this visit. Gen: NAD, resting comfortably HEENT: Mucous membranes are  moist. Oropharynx normal Neck: no thyromegaly CV: RRR no murmurs rubs or gallops Lungs: CTAB no crackles, wheeze, rhonchi Abdomen: soft/nontender/nondistended/normal bowel sounds. No rebound or guarding.  Ext: no edema Skin: warm, dry Neuro: grossly normal, moves all extremities, PERRLA***   Assessment and Plan   86 y.o. male presenting for annual physical.  Health Maintenance counseling: 1. Anticipatory guidance: Patient counseled regarding regular dental exams ***q6 months, eye exams ***,  avoiding smoking and second hand smoke*** , limiting alcohol to 1 beverage per day*** .   2. Risk factor reduction:  Advised patient of need for regular exercise and diet rich and fruits and vegetables to reduce risk of heart attack and stroke. Exercise- ***. Diet-***.  Wt Readings from Last 3 Encounters:  02/15/19 236 lb 6 oz (107.2 kg)  01/12/19 237 lb 12.8 oz (107.9 kg)  01/03/19 236 lb (107 kg)  3. Immunizations/screenings/ancillary studies Immunization History  Administered Date(s) Administered  . Influenza Whole 07/13/2005  . Influenza, High Dose Seasonal PF 04/03/2016, 05/03/2018  . Influenza,inj,Quad PF,6+ Mos 05/11/2015  . Pneumococcal Conjugate-13 04/03/2016  . Pneumococcal Polysaccharide-23 08/15/2008  . Td 08/15/2008  . Zoster 05/23/2012   Health Maintenance Due  Topic Date Due  . TETANUS/TDAP  08/15/2018  . INFLUENZA VACCINE  02/11/2019   4. Cervical cancer screening- *** 5. Breast cancer screening-  breast exam *** and mammogram *** 6. Colon cancer screening - *** 7. Skin cancer screening- ***advised regular sunscreen use. Denies worrisome, changing, or new skin lesions.  8. Birth control/STD check- *** 9. Osteoporosis screening at 16- *** -*** smoker  Status of chronic or acute concerns   ***As such, he meets criteria for a mild neurocognitive disorder (formerly "mild cognitive impairment") at the present time, likely moderate severity. *** No diagnosis  found.  Recommended follow up: ***No follow-ups on file. Future Appointments  Date Time Provider [place]  11/21/2019  7:55 AM [facility] DEVICE REMOTES [facility]  02/20/2020  7:55 AM [facility] DEVICE REMOTES [facility]  05/17/2020  9:45 AM Allred, Jeneen Rinks, MD [facility]    No chief complaint on file.  Lab/Order associations:*** fasting No diagnosis found.  No orders of the defined types were placed in this encounter.   Return precautions advised.  Thomes Cake, [place]

## 2019-10-06 ENCOUNTER — Telehealth: Payer: Self-pay

## 2019-10-06 NOTE — Telephone Encounter (Signed)
The pt was trying to send a transmission but was unsuccessful. I called [employer] tech support to get additional help. Sj is sending him a new cell adapter. The cell adapter will be there in 5-7 days. I will call back in 8 days to see if he received the cell adapter.

## 2019-10-17 NOTE — Telephone Encounter (Signed)
LMOVM for pt to call my direct office number. I'm trying to follow up to see if he received his new cell adapter.

## 2019-10-19 IMAGING — DX DG HIP (WITH OR WITHOUT PELVIS) 2-3V*L*
5 series · 5 of 5 positions shown · non-contrast
Comparison: 11/17/2017

CLINICAL DATA: Left hip pain

EXAM:
DG HIP (WITH OR WITHOUT PELVIS) 3V LEFT

[pelvis ap]
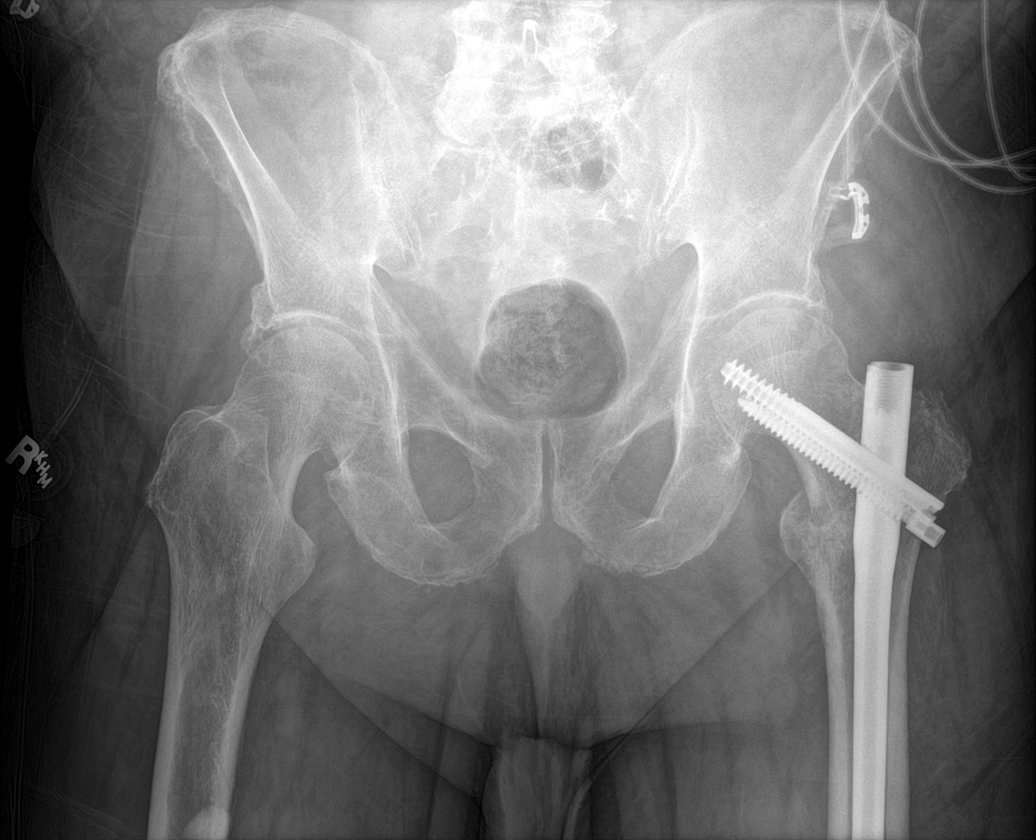

[hip ap (1 of 2)]
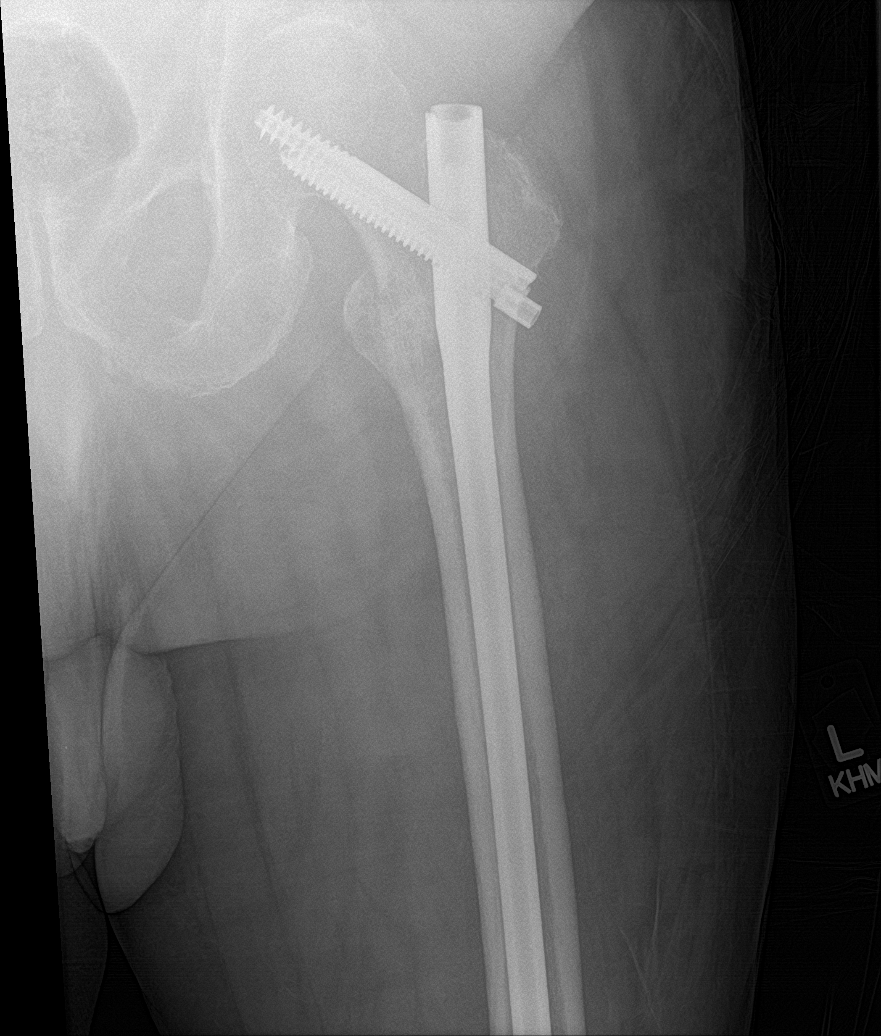

[hip ap (2 of 2)]
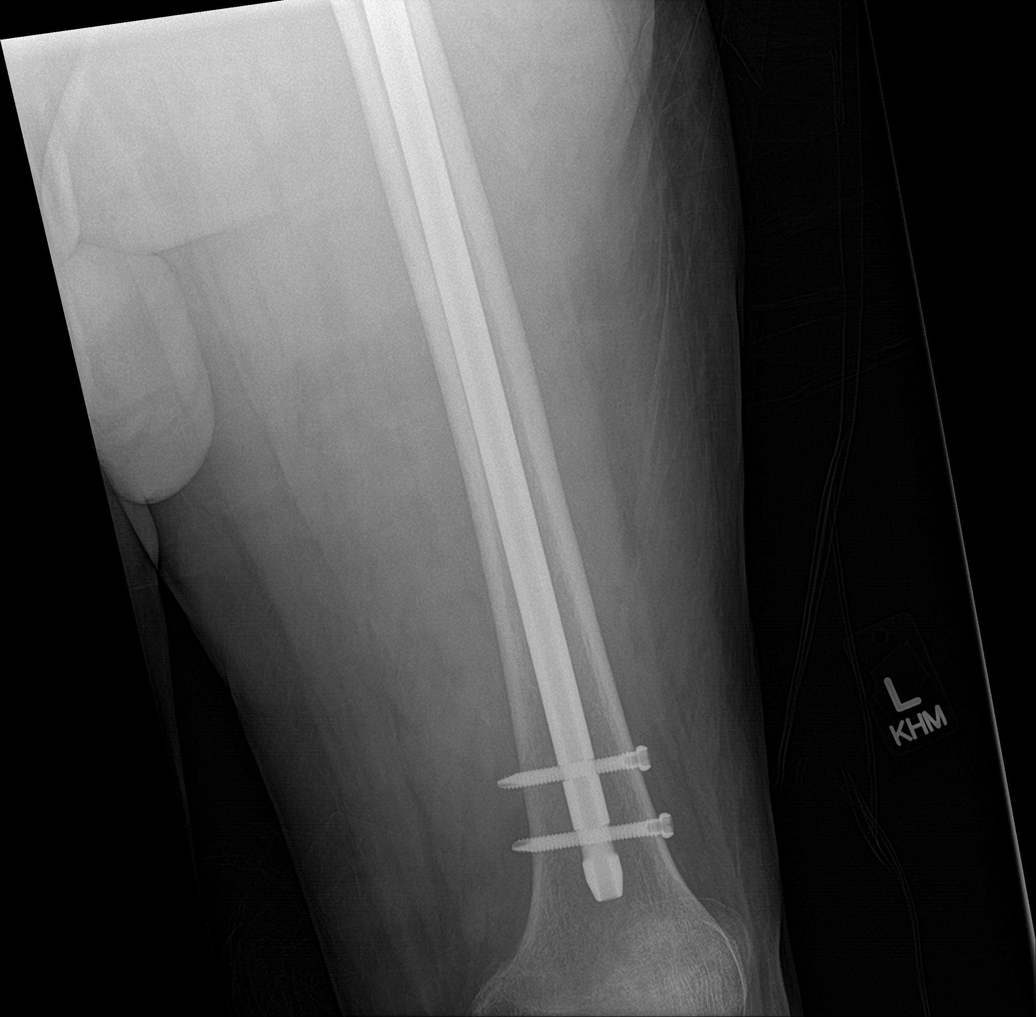

[hip lat (1 of 2)]
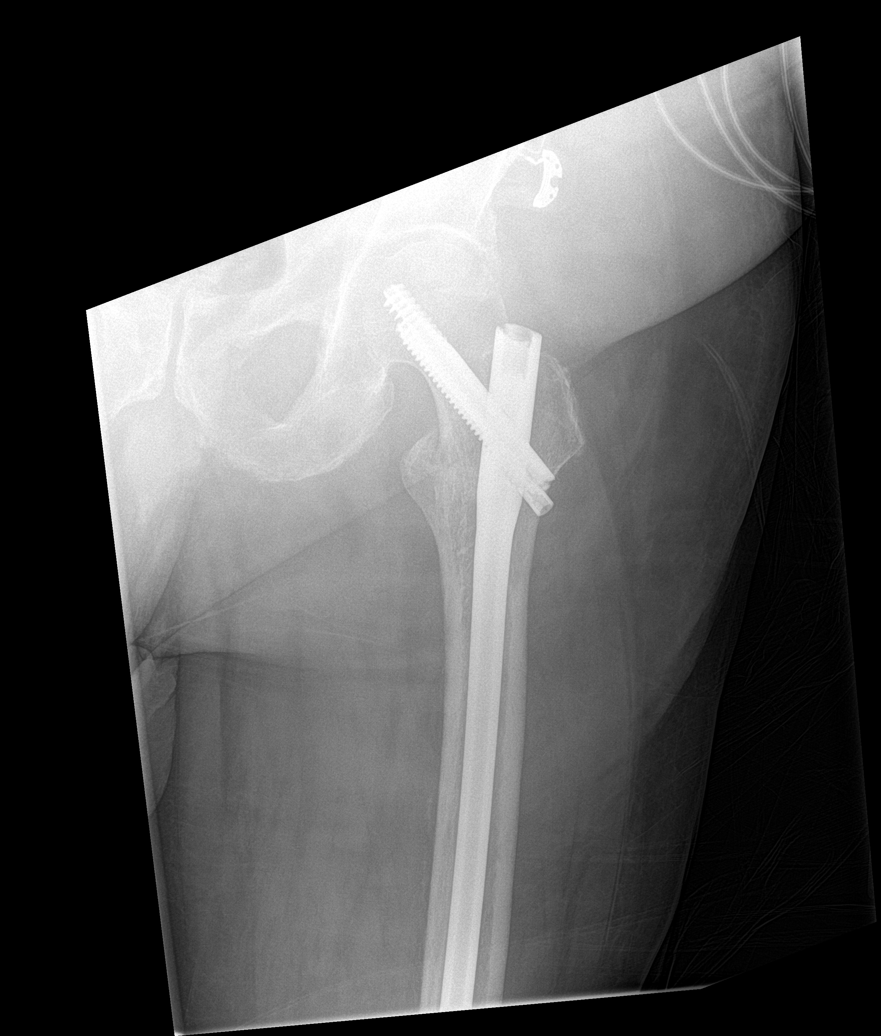

[hip lat (2 of 2)]
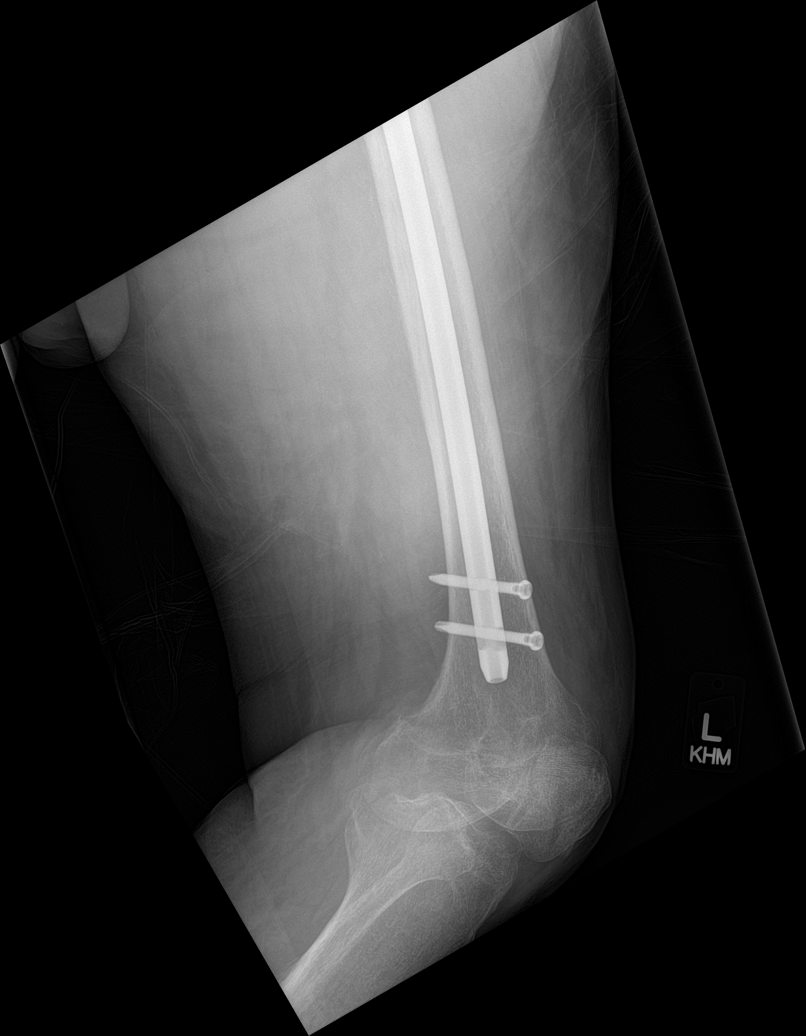

[5 of 5 positions shown; findings below may reference images not displayed]

FINDINGS: Pelvic ring is intact. Postsurgical changes in the left femur are
seen stable from that noted on the prior exam. There again noted
changes consistent with a prior intratrochanteric fracture with some
increased sclerosis consistent with healing. No soft tissue
abnormality is noted.
IMPRESSION: Postsurgical changes in left femur.  No acute abnormality noted.

## 2019-10-20 NOTE — Telephone Encounter (Signed)
LMOVM for pt to return my phone call.  

## 2019-10-25 NOTE — Telephone Encounter (Signed)
LMOVM

## 2019-10-27 NOTE — Telephone Encounter (Signed)
Letter sent 10-26-2019

## 2019-10-30 ENCOUNTER — Other Ambulatory Visit: Payer: Self-pay | Admitting: Family Medicine

## 2019-10-30 NOTE — Telephone Encounter (Signed)
Scheduled pt appt on 5/6 at 1:40pm.

## 2019-10-30 NOTE — Telephone Encounter (Signed)
Please call for f/u and send back to me after so I can send in refills.

## 2019-11-09 NOTE — Telephone Encounter (Signed)
Certified letter sent 11-09-2019

## 2019-11-16 ENCOUNTER — Ambulatory Visit: Payer: Medicare Other | Admitting: Family Medicine

## 2019-11-22 ENCOUNTER — Telehealth: Payer: Self-pay

## 2019-11-22 NOTE — Telephone Encounter (Signed)
Spoke with patient to remind of missed remote transmission 

## 2020-01-02 ENCOUNTER — Telehealth: Payer: Self-pay

## 2020-01-02 NOTE — Telephone Encounter (Signed)
Caregiver called pt. Pt wishes to move forward with PTNS as discussed with Dr. Alyson Ingles  per pt. Number given to [place]

## 2020-01-02 NOTE — Telephone Encounter (Signed)
I tried to help the pt to send a transmission. I called [employer] to get additional help. The [employer] rep states the pt needs to have his RF telemetry interrogated because it been to long without sending a transmission. A [employer] rep will have to be at the appointment to interrogate the pt. I told the pt that I will have the scheduler set up the appointment in [place] since he can not make it to the [place] office. I verified that his phone number is [phone].

## 2020-01-04 ENCOUNTER — Other Ambulatory Visit: Payer: Self-pay | Admitting: Family Medicine

## 2020-01-05 ENCOUNTER — Ambulatory Visit (INDEPENDENT_AMBULATORY_CARE_PROVIDER_SITE_OTHER): Payer: Medicare Other | Admitting: Internal Medicine

## 2020-01-05 ENCOUNTER — Encounter: Payer: Self-pay | Admitting: Internal Medicine

## 2020-01-05 ENCOUNTER — Ambulatory Visit (INDEPENDENT_AMBULATORY_CARE_PROVIDER_SITE_OTHER): Payer: Medicare Other | Admitting: *Deleted

## 2020-01-05 ENCOUNTER — Other Ambulatory Visit: Payer: Self-pay

## 2020-01-05 ENCOUNTER — Telehealth: Payer: Self-pay

## 2020-01-05 VITALS — BP 150/60 | HR 72 | Ht 71.0 in | Wt 250.0 lb

## 2020-01-05 DIAGNOSIS — E782 Mixed hyperlipidemia: Secondary | ICD-10-CM | POA: Diagnosis not present

## 2020-01-05 DIAGNOSIS — I10 Essential (primary) hypertension: Secondary | ICD-10-CM | POA: Diagnosis not present

## 2020-01-05 DIAGNOSIS — I447 Left bundle-branch block, unspecified: Secondary | ICD-10-CM | POA: Diagnosis not present

## 2020-01-05 DIAGNOSIS — I442 Atrioventricular block, complete: Secondary | ICD-10-CM

## 2020-01-05 NOTE — Progress Notes (Signed)
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 male who presents today for routine electrophysiology followup.  Since last being seen in our clinic, the patient reports doing very well.  SOB is stable.  Edema is stable. Today, he denies symptoms of palpitations, chest pain,  dizziness, presyncope, or syncope.  The patient is otherwise without complaint today.   Past Medical History:  Diagnosis Date  . Allergic rhinitis 02/02/2007  . Anemia   . Arthritis    "knees" (02/08/2017)  . Chronic lower back pain   . CKD (chronic kidney disease), stage III 05/27/2011  . Complete heart block 02/08/2017  . Eczema   . Essential hypertension 02/02/2007   Lasix 60 mg, losartan 50mg ,  Amlodipine 5mg --> 2.5 mg.  Usually have to repeat BP measurements  In the past-Maxzide 37.5-25mg .  Marland Kitchen GERD (gastroesophageal reflux disease) occasional  . Hearing loss of both ears   . Heart failure with preserved ejection fraction (HFpEF) 12/06/2013  . History of skin cancer 03/26/2014   nose   . Hx of echocardiogram    Echo (07/2013): Mild LVH, EF 51-70%, grade 1 diastolic dysfunction, mild LAE, PASP 23  . Hyperlipidemia   . Hypertension   . Hypothyroidism   . LBBB (left bundle branch block)   . Left patella fracture 2018   "no OR" (02/08/2017)  . Major depression in partial remission 02/02/2007   Amitriptyline 25mg  for sleep (stop when runs out of #10 04/2018(, zoloft 100-->150mg --> 200mg   . Mild neurocognitive disorder 06/21/2019  . Neuropathy ([place]) 11/02/2011  . Nocturia   . Peripheral vascular disease (HCC) LOWER EXTREMITIES  . Spinal stenosis in cervical region 03/21/2010  . Squamous cell skin cancer, penis: glans ([place]) 05/2010   Initial excision 11/11; recurrence, excision and laser Rx 9/13  . Thrombocytopenia ([place]) 05/23/2012  . Urinary frequency 09/05/2009   Possible BPH- see Shawna Orleans notes   . Vitamin D deficiency 08/15/2008   Past Surgical History:  Procedure Laterality  Date  . CIRCUMCISION/ LASER DISSECTION PENILE GLANS CANCER  06-02-2010  . CYSTOSCOPY WITH URETHRAL DILATATION  03/28/2012   Procedure: CYSTOSCOPY WITH URETHRAL DILATATION;  Surgeon: Ailene Rud, MD;  Location: [facility];  Service: Urology;  Laterality: N/A;  excision biopsy extensive meatal penile carcinoma meatoplasty  . EXCISION RIGHT WRIST BENIGN TUMOR  2002 (APPROX)  . hip surgery    . INCISION AND DRAINAGE DEEP NECK ABSCESS    . INGUINAL HERNIA REPAIR  1970's  . KNEE ARTHROSCOPY Right 2017  . PACEMAKER IMPLANT N/A 02/08/2017   Procedure: Pacemaker Implant;  Surgeon: Deboraha Sprang, MD;  Location: [facility];  Service: Cardiovascular;  Laterality: N/A;  . PACEMAKER IMPLANT  02/08/2017   SJM Assurity MRI dual chamber PPM implanted by Dr Caryl Comes for complete heart block  . PENILE BX  05-09-2010  . TONSILLECTOMY    . TRANSURETHRAL RESECTION OF PROSTATE N/A 05/10/2015   Procedure: CYSTOSCOPY, URETHRAL MEATAL DILATION, TRANSURETHRAL RESECTION OF THE PROSTATE (TURP);  Surgeon: Carolan Clines, MD;  Location: [facility];  Service: Urology;  Laterality: N/A;    ROS- all systems are reviewed and negative except as per HPI above  Current Outpatient Medications  Medication Sig Dispense Refill  . amLODipine (NORVASC) 2.5 MG tablet TAKE 1 TABLET BY MOUTH DAILY 90 tablet 0  . atorvastatin (LIPITOR) 20 MG tablet TAKE 1 TABLET BY MOUTH DAILY 90 tablet 3  . Calcium Carbonate-Vitamin  D (OYSTER SHELL CALCIUM 500 + D PO) Take 500 mg by mouth daily.    . ferrous sulfate 325 (65 FE) MG tablet Take 325 mg by mouth daily with breakfast.    . furosemide (LASIX) 20 MG tablet TAKE THREE TABLETS BY MOUTH DAILY 270 tablet 0  . levothyroxine (SYNTHROID) 75 MCG tablet TAKE 1 TABLET BY MOUTH EVERY MORNING 90 tablet 1  . losartan (COZAAR) 50 MG tablet TAKE 1 TABLET BY MOUTH DAILY 90 tablet 1  . sertraline (ZOLOFT) 100 MG tablet TAKE TWO TABLETS BY MOUTH EVERY DAY 180 tablet 1   No  current facility-administered medications for this visit.    Physical Exam: Vitals:   01/05/20 1015  BP: (!) 150/60  Pulse: 72  Weight: 250 lb (113.4 kg)  Height: 5\' 11"  (1.803 m)    GEN- The patient is disheveled appearing, smells of urine.  alert and oriented x 3 today.   Head- normocephalic, atraumatic Eyes-  Sclera clear, conjunctiva pink Ears- hearing reduced Oropharynx- clear Lungs-   normal work of breathing Chest- pacemaker pocket is well healed Heart- Regular rate and rhythm  GI- soft Extremities- no clubbing, cyanosis, + edema  Pacemaker interrogation- reviewed in detail today,  See PACEART report  ekg tracing ordered today is personally reviewed and shows sinus with V pacing  Assessment and Plan:  1. Symptomatic complete heart block Normal pacemaker function See Pace Art report No changes today he is device dependant today  2. HTN Stable No change required today  3. HL Continue lipitor  Risks, benefits and potential toxicities for medications prescribed and/or refilled reviewed with patient today.   Continue remote monitoring Return in a year  [name] MD, [facility] 01/05/2020 10:46 AM

## 2020-01-05 NOTE — Patient Instructions (Addendum)
Medication Instructions:   Your physician recommends that you continue on your current medications as directed. Please refer to the Current Medication list given to you today.  Labwork:  NONE  Testing/Procedures:  NONE  Follow-Up: Your physician recommends that you schedule a follow-up appointment in: 1 year (office). Please schedule this appointment today before leaving the office.   Any Other Special Instructions Will Be Listed Below (If Applicable).  If you need a refill on your cardiac medications before your next appointment, please call your pharmacy. 

## 2020-01-07 LAB — CUP PACEART REMOTE DEVICE CHECK
Battery Remaining Longevity: 103 mo
Battery Remaining Percentage: 95.5 %
Battery Voltage: 2.99 V
Brady Statistic AP VP Percent: 16 %
Brady Statistic AP VS Percent: 1 %
Brady Statistic AS VP Percent: 80 %
Brady Statistic AS VS Percent: 2 %
Brady Statistic RA Percent Paced: 12 %
Brady Statistic RV Percent Paced: 96 %
Date Time Interrogation Session: 20210625152926
Implantable Lead Implant Date: 20180730
Implantable Lead Implant Date: 20180730
Implantable Lead Location: 753859
Implantable Lead Location: 753860
Implantable Lead Model: 5076
Implantable Lead Model: 5076
Implantable Pulse Generator Implant Date: 20180730
Lead Channel Impedance Value: 410 Ohm
Lead Channel Impedance Value: 460 Ohm
Lead Channel Pacing Threshold Amplitude: 0.5 V
Lead Channel Pacing Threshold Amplitude: 1 V
Lead Channel Pacing Threshold Pulse Width: 0.5 ms
Lead Channel Pacing Threshold Pulse Width: 0.5 ms
Lead Channel Sensing Intrinsic Amplitude: 1.9 mV
Lead Channel Sensing Intrinsic Amplitude: 12 mV
Lead Channel Setting Pacing Amplitude: 2 V
Lead Channel Setting Pacing Amplitude: 2.5 V
Lead Channel Setting Pacing Pulse Width: 0.5 ms
Lead Channel Setting Sensing Sensitivity: 4 mV
Pulse Gen Model: 2272
Pulse Gen Serial Number: 8930553

## 2020-01-08 NOTE — Progress Notes (Signed)
Remote pacemaker transmission.   

## 2020-01-11 ENCOUNTER — Other Ambulatory Visit: Payer: Self-pay | Admitting: Family Medicine

## 2020-01-12 ENCOUNTER — Telehealth: Payer: Self-pay | Admitting: Family Medicine

## 2020-01-12 NOTE — Telephone Encounter (Signed)
Nurse Assessment Nurse: Noberto Retort, RN, [facility] Date/Time Eilene Ghazi Time): 01/12/2020 3:18:57 PM Confirm and document reason for call. If symptomatic, describe symptoms. ---Caller states blood pressure is high and his feet legs are swelling bilaterally. He does not know his BP. He also has problems walking. No fever. No other s/s at this time. Has the patient had close contact with a person known or suspected to have the novel coronavirus illness OR traveled / lives in area with major community spread (including international travel) in the last 14 days from the onset of symptoms? * If Asymptomatic, screen for exposure and travel within the last 14 days. ---No Does the patient have any new or worsening symptoms? ---Yes Will a triage be completed? ---Yes Related visit to physician within the last 2 weeks? ---No Does the PT have any chronic conditions? (i.e. diabetes, asthma, this includes High risk factors for pregnancy, etc.) ---Yes List chronic conditions. ---HTN, Heart problems, Urination issues Is this a behavioral health or substance abuse call? ---No Guidelines Guideline Title Affirmed Question Affirmed Notes Nurse Date/Time Eilene Ghazi Time) Leg Swelling and Edema Difficulty breathing at rest [place], RN, [facility] 01/12/2020 3:22:43 PM Disp. Time (Eastern Time) Disposition Final UserPLEASE NOTE: All timestamps contained within this report are represented as [place] Standard Time. CONFIDENTIALTY NOTICE: This fax transmission is intended only for the addressee. It contains information that is legally privileged, confidential or otherwise protected from use or disclosure. If you are not the intended recipient, you are strictly prohibited from reviewing, disclosing, copying using or disseminating any of this information or taking any action in reliance on or regarding this information. If you have received this fax in error, please notify us immediately by telephone so that we can arrange for its  return to [place]. Phone: [phone], Toll-Free: [phone], Fax: [phone] Page: 2 of 2 Call Id: [id number] 01/12/2020 3:25:17 PM Go to ED Now Yes Noberto Retort, RN, Renne Musca Disagree/Comply Comply Caller Understands Yes PreDisposition InappropriateToAsk Care Advice Given Per Guideline GO TO ED NOW: * You need to be seen in the Emergency Department. * Go to the ED at ___________ [place] now. Drive carefully. NOTE TO TRIAGER - DRIVING: * Another adult should drive. * If immediate transportation is not available via car or taxi, then the patient should be instructed to call [id number]. BRING MEDICINES: * Please bring a list of your current medicines when you go to the Emergency Department (ER). * It is also a good idea to bring the pill bottles too. This will help the doctor to make certain you are taking the right medicines and the right dose. CARE ADVICE given per Leg Swelling and Edema (Adult) guideline. Referrals GO TO FACILITY OTHER - SPECIF

## 2020-01-31 ENCOUNTER — Telehealth: Payer: Self-pay | Admitting: Family Medicine

## 2020-01-31 NOTE — Telephone Encounter (Signed)
Pt called very upset about his current medical status. Pt states he wants to see Dr. Yong Channel to be checked head to toe. Pt asked if he could be seen ASAP. Scheduled pt on 7/30 to see Dr. Yong Channel.

## 2020-01-31 NOTE — Telephone Encounter (Signed)
Noted  

## 2020-02-09 ENCOUNTER — Encounter: Payer: Self-pay | Admitting: Family Medicine

## 2020-02-09 ENCOUNTER — Ambulatory Visit: Payer: Medicare Other | Admitting: Family Medicine

## 2020-02-09 ENCOUNTER — Other Ambulatory Visit: Payer: Self-pay

## 2020-02-09 ENCOUNTER — Ambulatory Visit (INDEPENDENT_AMBULATORY_CARE_PROVIDER_SITE_OTHER): Payer: Medicare Other | Admitting: Family Medicine

## 2020-02-09 VITALS — BP 124/60 | HR 84 | Temp 97.3°F | Resp 18 | Ht 71.0 in | Wt 250.0 lb

## 2020-02-09 DIAGNOSIS — D696 Thrombocytopenia, unspecified: Secondary | ICD-10-CM

## 2020-02-09 DIAGNOSIS — E039 Hypothyroidism, unspecified: Secondary | ICD-10-CM | POA: Diagnosis not present

## 2020-02-09 DIAGNOSIS — I5032 Chronic diastolic (congestive) heart failure: Secondary | ICD-10-CM | POA: Diagnosis not present

## 2020-02-09 DIAGNOSIS — E785 Hyperlipidemia, unspecified: Secondary | ICD-10-CM

## 2020-02-09 DIAGNOSIS — R531 Weakness: Secondary | ICD-10-CM | POA: Diagnosis not present

## 2020-02-09 DIAGNOSIS — F325 Major depressive disorder, single episode, in full remission: Secondary | ICD-10-CM
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
## 2020-02-09 NOTE — Progress Notes (Deleted)
Phone [phone] In person visit   Subjective:   Hunter [name] is a 86 y.o. year old very pleasant male patient who presents for/with See problem oriented charting No chief complaint on file.   This visit occurred during the SARS-CoV-2 public health emergency.  Safety protocols were in place, including screening questions prior to the visit, additional usage of staff PPE, and extensive cleaning of exam room while observing appropriate contact time as indicated for disinfecting solutions.   Past Medical History-  Patient Active Problem List   Diagnosis Date Noted   Mild cognitive impairment 06/21/2019   Generalized weakness 10/13/2018   Acute blood loss as cause of postoperative anemia 10/26/2017   Delirium 10/24/2017   Hypokalemia 10/24/2017   Pulmonary edema 10/24/2017   Cardiac pacemaker 10/22/2017   Fall 10/21/2017   Fracture of unspecified part of neck of left femur, initial encounter for closed fracture ([place]) 10/21/2017   Complete heart block ([place]) 02/08/2017   BPH (benign prostatic hyperplasia) 05/10/2015   Skin lesion of left lower extremity 03/26/2014   Stasis eczema 03/26/2014   History of skin cancer 03/26/2014   Heart failure with preserved ejection fraction (HFpEF)  12/06/2013   Syncope 08/14/2013   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 06/15/2012   Thrombocytopenia ([place]) 05/23/2012   Neuropathy ([place]) 11/02/2011   CKD (chronic kidney disease) 05/27/2011   Low back pain 04/08/2011   LBBB (left bundle branch block) 05/12/2010   Abnormality of gait 04/23/2010   Spinal stenosis in cervical region 03/21/2010   Constipation 03/19/2010   Urinary frequency 09/05/2009   Hypothyroidism 08/15/2008   Vitamin D deficiency 08/15/2008   Anemia 08/15/2008   Venous stasis 08/15/2008   Hyperlipidemia 02/02/2007   Major depression in partial remission ([place]) 02/02/2007   HTN (hypertension) 02/02/2007   Allergic rhinitis 02/02/2007    GERD (gastroesophageal reflux disease) 02/02/2007    Medications- reviewed and updated Current Outpatient Medications  Medication Sig Dispense Refill   amLODipine (NORVASC) 2.5 MG tablet TAKE 1 TABLET BY MOUTH DAILY 90 tablet 0   atorvastatin (LIPITOR) 20 MG tablet TAKE 1 TABLET BY MOUTH DAILY 90 tablet 3   Calcium Carbonate-Vitamin D (OYSTER SHELL CALCIUM 500 + D PO) Take 500 mg by mouth daily.     ferrous sulfate 325 (65 FE) MG tablet Take 325 mg by mouth daily with breakfast.     furosemide (LASIX) 20 MG tablet TAKE THREE TABLETS BY MOUTH DAILY 270 tablet 0   levothyroxine (SYNTHROID) 75 MCG tablet TAKE 1 TABLET BY MOUTH EVERY MORNING 90 tablet 1   losartan (COZAAR) 50 MG tablet TAKE 1 TABLET BY MOUTH DAILY 90 tablet 1   sertraline (ZOLOFT) 100 MG tablet TAKE TWO TABLETS BY MOUTH EVERY DAY 180 tablet 0   No current facility-administered medications for this visit.     Objective:  There were no vitals taken for this visit. Gen: NAD, resting comfortably CV: RRR no murmurs rubs or gallops Lungs: CTAB no crackles, wheeze, rhonchi Abdomen: soft/nontender/nondistended/normal bowel sounds. No rebound or guarding.  Ext: no edema Skin: warm, dry Neuro: grossly normal, moves all extremities  ***    Assessment and Plan  *** ***As such, he meets criteria for a mild neurocognitive disorder (formerly "mild cognitive impairment") at the present time, likely moderate severity.  No problem-specific Assessment & Plan notes found for this encounter.   Recommended follow up: ***No follow-ups on file. Future Appointments  Date Time Provider [place]  02/09/2020  1:00 PM [place],  Brayton Mars, MD [facility]  04/05/2020  4:20 PM [facility] DEVICE REMOTES [facility]  07/08/2020  8:55 AM [facility] DEVICE REMOTES [facility]  10/07/2020  8:55 AM [facility] DEVICE REMOTES [facility]  12/13/2020 10:30 AM Allred, Jeneen Rinks, MD [facility]  01/06/2021  8:55 AM [facility] DEVICE REMOTES [facility]  04/07/2021  8:55 AM [facility] DEVICE REMOTES [facility]    Lab/Order associations: No diagnosis found.  No orders of the defined types were placed in this encounter.   Time Spent: *** minutes of total time (8:29 AM***- 8:29 AM***) was spent on the date of the encounter performing the following actions: chart review prior to seeing the patient, obtaining history, performing a medically necessary exam, counseling on the treatment plan, placing orders, and documenting in our EHR.   Return precautions advised.  Francella Solian, CMA

## 2020-02-09 NOTE — Patient Instructions (Addendum)
We will call you within two weeks about your referral to home health for exercises and nursing to evaluate how you are taking your pills. If you do not hear within 3 weeks, give [place] a call.   Continue lasix 60 mg every morning  Follow up with urology  Please stop by lab before you go If you have mychart- we will send your results within 3 business days of [place] receiving them.  If you do not have mychart- we will call you about results within 5 business days of [place] receiving them.     Recommended follow up: Return in about 6 weeks (around 03/22/2020) for follow up- or sooner if needed. to check in on fatigueweakness

## 2020-02-10 LAB — COMPREHENSIVE METABOLIC PANEL
AG Ratio: 2.2 (calc) (ref 1.0–2.5)
ALT: 19 U/L (ref 9–46)
AST: 20 U/L (ref 10–35)
Albumin: 4.7 g/dL (ref 3.6–5.1)
Alkaline phosphatase (APISO): 73 U/L (ref 35–144)
BUN/Creatinine Ratio: 18 (calc) (ref 6–22)
BUN: 29 mg/dL — ABNORMAL HIGH (ref 7–25)
CO2: 23 mmol/L (ref 20–32)
Calcium: 9.5 mg/dL (ref 8.6–10.3)
Chloride: 106 mmol/L (ref 98–110)
Creat: 1.57 mg/dL — ABNORMAL HIGH (ref 0.70–1.11)
Globulin: 2.1 g/dL (calc) (ref 1.9–3.7)
Glucose, Bld: 101 mg/dL — ABNORMAL HIGH (ref 65–99)
Potassium: 4.2 mmol/L (ref 3.5–5.3)
Sodium: 140 mmol/L (ref 135–146)
Total Bilirubin: 0.7 mg/dL (ref 0.2–1.2)
Total Protein: 6.8 g/dL (ref 6.1–8.1)

## 2020-02-10 LAB — LIPID PANEL
Cholesterol: 195 mg/dL (ref <200)
HDL: 39 mg/dL — ABNORMAL LOW (ref >=40)
LDL Cholesterol (Calc): 120 mg/dL (calc) — ABNORMAL HIGH
Non-HDL Cholesterol (Calc): 156 mg/dL (calc) — ABNORMAL HIGH (ref <130)
Total CHOL/HDL Ratio: 5 (calc) — ABNORMAL HIGH (ref <5.0)
Triglycerides: 238 mg/dL — ABNORMAL HIGH (ref <150)

## 2020-02-10 LAB — CBC WITH DIFFERENTIAL/PLATELET
Absolute Monocytes: 724 cells/uL (ref 200–950)
Basophils Absolute: 47 cells/uL (ref 0–200)
Basophils Relative: 0.5 %
Eosinophils Absolute: 66 cells/uL (ref 15–500)
Eosinophils Relative: 0.7 %
HCT: 43.7 % (ref 38.5–50.0)
Hemoglobin: 14.4 g/dL (ref 13.2–17.1)
Lymphs Abs: 3553 cells/uL (ref 850–3900)
MCH: 26.6 pg — ABNORMAL LOW (ref 27.0–33.0)
MCHC: 33 g/dL (ref 32.0–36.0)
MCV: 80.8 fL (ref 80.0–100.0)
MPV: 12.5 fL (ref 7.5–12.5)
Monocytes Relative: 7.7 %
Neutro Abs: 5010 cells/uL (ref 1500–7800)
Neutrophils Relative %: 53.3 %
Platelets: 149 10*3/uL (ref 140–400)
RBC: 5.41 10*6/uL (ref 4.20–5.80)
RDW: 14.2 % (ref 11.0–15.0)
Total Lymphocyte: 37.8 %
WBC: 9.4 10*3/uL (ref 3.8–10.8)

## 2020-02-10 LAB — TSH: TSH: 4.11 mIU/L (ref 0.40–4.50)

## 2020-02-14 ENCOUNTER — Other Ambulatory Visit: Payer: Self-pay | Admitting: Pharmacy Technician

## 2020-02-14 NOTE — Patient Outreach (Signed)
[facility]) Care Management  02/14/2020  Hunter [name] [birth year] [phone]  Received call from Marval Regal, pharmacist at [facility] who was looking for [name], Pharm D at [facility].  Informed pharmacist that [name] no longer works at [facility]. He inquired if a pharmacist could call him back concerning medication adherence  for this patient. Informed him a referral would be placed and one of our other pharmacists would be calling him back.  Referral placed and assigned to [facility].  Brevon Dewald P. Brayla Pat, [place]  [phone]

## 2020-02-15 ENCOUNTER — Telehealth: Payer: Self-pay | Admitting: Family Medicine

## 2020-02-15 NOTE — Progress Notes (Signed)
  Chronic Care Management   Outreach Note  02/15/2020 Name: ROSEVELT LUU MRN: [id number] DOB: 1934/07/04  Referred by: [name], MD Reason for referral : No chief complaint on file.   An unsuccessful telephone outreach was attempted today. The patient was referred to the pharmacist for assistance with care management and care coordination.   Follow Up Plan:   Earney [place] [facility] Scheduler

## 2020-02-23 ENCOUNTER — Other Ambulatory Visit (HOSPITAL_COMMUNITY): Payer: Self-pay | Admitting: Orthopedic Surgery

## 2020-02-23 ENCOUNTER — Other Ambulatory Visit: Payer: Self-pay | Admitting: Orthopedic Surgery

## 2020-02-23 DIAGNOSIS — M5442 Lumbago with sciatica, left side: Secondary | ICD-10-CM | POA: Diagnosis not present

## 2020-02-26 ENCOUNTER — Telehealth: Payer: Self-pay | Admitting: Nurse Practitioner

## 2020-02-26 ENCOUNTER — Other Ambulatory Visit: Payer: Self-pay | Admitting: Orthopedic Surgery

## 2020-02-26 DIAGNOSIS — M48061 Spinal stenosis, lumbar region without neurogenic claudication: Secondary | ICD-10-CM

## 2020-02-26 DIAGNOSIS — M545 Low back pain, unspecified: Secondary | ICD-10-CM

## 2020-02-26 DIAGNOSIS — G8929 Other chronic pain: Secondary | ICD-10-CM

## 2020-02-26 NOTE — Telephone Encounter (Signed)
Phone call to patient to verify medication list and allergies for myelogram procedure. Pt instructed to hold zoloft for 48hrs prior to myelogram appointment time. Pt verbalized understanding. Pre and post procedure instructions reviewed with pt. 

## 2020-02-29 DIAGNOSIS — R350 Frequency of micturition: Secondary | ICD-10-CM | POA: Diagnosis not present

## 2020-03-04 ENCOUNTER — Ambulatory Visit
Admission: RE | Admit: 2020-03-04 | Discharge: 2020-03-04 | Disposition: A | Discharge status: Home / Self Care | Payer: Medicare Other | Source: Ambulatory Visit | Attending: Orthopedic Surgery | Admitting: Orthopedic Surgery

## 2020-03-04 ENCOUNTER — Other Ambulatory Visit: Payer: Self-pay

## 2020-03-04 DIAGNOSIS — M545 Low back pain, unspecified: Secondary | ICD-10-CM

## 2020-03-04 DIAGNOSIS — M48061 Spinal stenosis, lumbar region without neurogenic claudication: Secondary | ICD-10-CM

## 2020-03-04 DIAGNOSIS — G8929 Other chronic pain: Secondary | ICD-10-CM
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
## 2020-03-04 NOTE — Discharge Instructions (Signed)
Myelogram Discharge Instructions  1. Go home and rest quietly for the next 24 hours.  It is important to lie flat for the next 24 hours.  Get up only to go to the restroom.  You may lie in the bed or on a couch on your back, your stomach, your left side or your right side.  You may have one pillow under your head.  You may have pillows between your knees while you are on your side or under your knees while you are on your back.  2. DO NOT drive today.  Recline the seat as far back as it will go, while still wearing your seat belt, on the way home.  3. You may get up to go to the bathroom as needed.  You may sit up for 10 minutes to eat.  You may resume your normal diet and medications unless otherwise indicated.  Drink lots of extra fluids today and tomorrow.  4. The incidence of headache, nausea, or vomiting is about 5% (one in 20 patients).  If you develop a headache, lie flat and drink plenty of fluids until the headache goes away.  Caffeinated beverages may be helpful.  If you develop severe nausea and vomiting or a headache that does not go away with flat bed rest, call [phone].  5. You may resume normal activities after your 24 hours of bed rest is over; however, do not exert yourself strongly or do any heavy lifting tomorrow. If when you get up you have a headache when standing, go back to bed and force fluids for another 24 hours.  6. Call your physician for a follow-up appointment.  The results of your myelogram will be sent directly to your physician by the following day.  7. If you have any questions or if complications develop after you arrive home, please call [phone].  Discharge instructions have been explained to the patient.  The patient, or the person responsible for the patient, fully understands these instructions  YOU MAY RESTART YOUR ZOLOFT TOMORROW 03/05/2020 AT 1 PM

## 2020-04-01 ENCOUNTER — Other Ambulatory Visit: Payer: Self-pay | Admitting: Family Medicine

## 2020-04-05 ENCOUNTER — Ambulatory Visit (INDEPENDENT_AMBULATORY_CARE_PROVIDER_SITE_OTHER): Payer: Medicare Other | Admitting: Emergency Medicine

## 2020-04-05 DIAGNOSIS — I442 Atrioventricular block, complete: Secondary | ICD-10-CM | POA: Diagnosis not present

## 2020-04-08 ENCOUNTER — Telehealth: Payer: Self-pay

## 2020-04-08 LAB — CUP PACEART REMOTE DEVICE CHECK
Battery Remaining Longevity: 103 mo
Battery Remaining Percentage: 95.5 %
Battery Voltage: 2.99 V
Brady Statistic AP VP Percent: 31 %
Brady Statistic AP VS Percent: 1 %
Brady Statistic AS VP Percent: 66 %
Brady Statistic AS VS Percent: 1.2 %
Brady Statistic RA Percent Paced: 29 %
Brady Statistic RV Percent Paced: 97 %
Date Time Interrogation Session: 20210924020034
Implantable Lead Implant Date: 20180730
Implantable Lead Implant Date: 20180730
Implantable Lead Location: 753859
Implantable Lead Location: 753860
Implantable Lead Model: 5076
Implantable Lead Model: 5076
Implantable Pulse Generator Implant Date: 20180730
Lead Channel Impedance Value: 410 Ohm
Lead Channel Impedance Value: 460 Ohm
Lead Channel Pacing Threshold Amplitude: 0.5 V
Lead Channel Pacing Threshold Amplitude: 1 V
Lead Channel Pacing Threshold Pulse Width: 0.5 ms
Lead Channel Pacing Threshold Pulse Width: 0.5 ms
Lead Channel Sensing Intrinsic Amplitude: 10.9 mV
Lead Channel Sensing Intrinsic Amplitude: 2 mV
Lead Channel Setting Pacing Amplitude: 2 V
Lead Channel Setting Pacing Amplitude: 2.5 V
Lead Channel Setting Pacing Pulse Width: 0.5 ms
Lead Channel Setting Sensing Sensitivity: 4 mV
Pulse Gen Model: 2272
Pulse Gen Serial Number: 8930553
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
## 2020-04-09 NOTE — Progress Notes (Signed)
Remote pacemaker transmission.   

## 2020-04-09 NOTE — Discharge Instructions (Signed)
Myelogram Discharge Instructions  1. Go home and rest quietly for the next 24 hours.  It is important to lie flat for the next 24 hours.  Get up only to go to the restroom.  You may lie in the bed or on a couch on your back, your stomach, your left side or your right side.  You may have one pillow under your head.  You may have pillows between your knees while you are on your side or under your knees while you are on your back.  2. DO NOT drive today.  Recline the seat as far back as it will go, while still wearing your seat belt, on the way home.  3. You may get up to go to the bathroom as needed.  You may sit up for 10 minutes to eat.  You may resume your normal diet and medications unless otherwise indicated.  Drink lots of extra fluids today and tomorrow.  4. The incidence of headache, nausea, or vomiting is about 5% (one in 20 patients).  If you develop a headache, lie flat and drink plenty of fluids until the headache goes away.  Caffeinated beverages may be helpful.  If you develop severe nausea and vomiting or a headache that does not go away with flat bed rest, call [phone].  5. You may resume normal activities after your 24 hours of bed rest is over; however, do not exert yourself strongly or do any heavy lifting tomorrow. If when you get up you have a headache when standing, go back to bed and force fluids for another 24 hours.  6. Call your physician for a follow-up appointment.  The results of your myelogram will be sent directly to your physician by the following day.  7. If you have any questions or if complications develop after you arrive home, please call [phone].  Discharge instructions have been explained to the patient.  The patient, or the person responsible for the patient, fully understands these instructions.  YOU MAY RESTART YOUR ZOLOFT TOMORROW 04/11/20 AT 10:30AM.

## 2020-04-10 ENCOUNTER — Ambulatory Visit
Admission: RE | Admit: 2020-04-10 | Discharge: 2020-04-10 | Disposition: A | Discharge status: Home / Self Care | Payer: Medicare Other | Source: Ambulatory Visit | Attending: Orthopedic Surgery | Admitting: Orthopedic Surgery

## 2020-04-10 ENCOUNTER — Other Ambulatory Visit: Payer: Self-pay

## 2020-04-10 DIAGNOSIS — M48061 Spinal stenosis, lumbar region without neurogenic claudication: Secondary | ICD-10-CM | POA: Diagnosis not present

## 2020-04-10 DIAGNOSIS — M4316 Spondylolisthesis, lumbar region: Secondary | ICD-10-CM | POA: Diagnosis not present

## 2020-04-10 MED ORDER — DIAZEPAM 5 MG PO TABS
5.0000 mg | ORAL_TABLET | Freq: Once | ORAL | Status: AC
  Administered 2020-04-10: 5 mg via ORAL

## 2020-04-10 MED ORDER — IOPAMIDOL (ISOVUE-M 200) INJECTION 41%
18.0000 mL | Freq: Once | INTRAMUSCULAR | Status: AC
  Administered 2020-04-10: 18 mL via INTRATHECAL

## 2020-04-11 ENCOUNTER — Telehealth: Payer: Self-pay
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
## 2020-04-11 NOTE — Telephone Encounter (Signed)
Can do 11 40 on Friday
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
## 2020-04-11 NOTE — Telephone Encounter (Signed)
Scheduled pt on 10/1 @ 1140

## 2020-04-11 NOTE — Telephone Encounter (Signed)
Tried to call patient to schedule with Dr. Yong Channel phone just kept ringing.

## 2020-04-12 ENCOUNTER — Encounter: Payer: Self-pay | Admitting: Family Medicine

## 2020-04-12 ENCOUNTER — Ambulatory Visit (INDEPENDENT_AMBULATORY_CARE_PROVIDER_SITE_OTHER): Payer: Medicare Other | Admitting: Family Medicine

## 2020-04-12 ENCOUNTER — Encounter: Payer: Medicare Other | Admitting: Internal Medicine

## 2020-04-12 ENCOUNTER — Other Ambulatory Visit: Payer: Self-pay

## 2020-04-12 ENCOUNTER — Ambulatory Visit: Payer: Medicare Other | Admitting: Family Medicine

## 2020-04-12 ENCOUNTER — Telehealth: Payer: Self-pay

## 2020-04-12 VITALS — BP 168/76 | HR 63 | Temp 98.0°F | Ht 71.0 in | Wt 250.0 lb

## 2020-04-12 DIAGNOSIS — I5032 Chronic diastolic (congestive) heart failure: Secondary | ICD-10-CM | POA: Diagnosis not present

## 2020-04-12 DIAGNOSIS — Z5989 Other problems related to housing and economic circumstances: Secondary | ICD-10-CM

## 2020-04-12 DIAGNOSIS — I10 Essential (primary) hypertension: Secondary | ICD-10-CM | POA: Diagnosis not present

## 2020-04-12 DIAGNOSIS — Z5900 Homelessness unspecified: Secondary | ICD-10-CM

## 2020-04-12 MED ORDER — LOSARTAN POTASSIUM 50 MG PO TABS
50.0000 mg | ORAL_TABLET | Freq: Every day | ORAL | 1 refills | Status: DC
Start: 2020-04-12 — End: 2020-12-26
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
## 2020-04-15 NOTE — Patient Instructions (Addendum)
Health Maintenance Due  Topic Date Due  . INFLUENZA VACCINE - ask your pharmacist to make sure you have not had flu shot 02/11/2020   Please call [facility] back- hoping they can help you with housing  Blood pressure and fluid in legs looks much better-So glad you were able to restart your meds  If you have any thoughts of self harm please call 911 immediately  Change dressing on left forearm daily.   Please stop by lab before you go If you have mychart- we will send your results within 3 business days of [place] receiving them.  If you do not have mychart- we will call you about results within 5 business days of [place] receiving them.  *please note we are currently using [facility] labs which has a longer processing time than [facility] typically so labs may not come back as quickly as in the past *please also note that you will see labs on mychart as soon as they post. I will later go in and write notes on them- will say "notes from Dr. Yong Channel"

## 2020-04-16 ENCOUNTER — Other Ambulatory Visit: Payer: Self-pay

## 2020-04-16 ENCOUNTER — Ambulatory Visit (INDEPENDENT_AMBULATORY_CARE_PROVIDER_SITE_OTHER): Payer: Medicare Other | Admitting: Family Medicine

## 2020-04-16 ENCOUNTER — Other Ambulatory Visit: Payer: Self-pay | Admitting: *Deleted

## 2020-04-16 ENCOUNTER — Encounter: Payer: Self-pay | Admitting: Family Medicine

## 2020-04-16 VITALS — BP 118/58 | HR 64 | Temp 97.3°F | Ht 71.0 in | Wt 241.6 lb

## 2020-04-16 DIAGNOSIS — Z59811 Housing instability, housed, with risk of homelessness: Secondary | ICD-10-CM

## 2020-04-16 DIAGNOSIS — I5032 Chronic diastolic (congestive) heart failure: Secondary | ICD-10-CM | POA: Diagnosis not present

## 2020-04-16 DIAGNOSIS — I10 Essential (primary) hypertension: Secondary | ICD-10-CM

## 2020-04-16 DIAGNOSIS — F3341 Major depressive disorder, recurrent, in partial remission: Secondary | ICD-10-CM

## 2020-04-16 DIAGNOSIS — E785 Hyperlipidemia, unspecified: Secondary | ICD-10-CM

## 2020-04-16 DIAGNOSIS — Z5900 Homelessness unspecified: Secondary | ICD-10-CM
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
## 2020-04-17 ENCOUNTER — Encounter: Payer: Self-pay | Admitting: *Deleted

## 2020-04-17 ENCOUNTER — Other Ambulatory Visit: Payer: Self-pay | Admitting: *Deleted

## 2020-04-17 LAB — CBC WITH DIFFERENTIAL/PLATELET
Absolute Monocytes: 561 cells/uL (ref 200–950)
Basophils Absolute: 34 cells/uL (ref 0–200)
Basophils Relative: 0.4 %
Eosinophils Absolute: 17 cells/uL (ref 15–500)
Eosinophils Relative: 0.2 %
HCT: 43.9 % (ref 38.5–50.0)
Hemoglobin: 14.8 g/dL (ref 13.2–17.1)
Lymphs Abs: 2737 cells/uL (ref 850–3900)
MCH: 27 pg (ref 27.0–33.0)
MCHC: 33.7 g/dL (ref 32.0–36.0)
MCV: 80 fL (ref 80.0–100.0)
MPV: 12 fL (ref 7.5–12.5)
Monocytes Relative: 6.6 %
Neutro Abs: 5151 cells/uL (ref 1500–7800)
Neutrophils Relative %: 60.6 %
Platelets: 144 10*3/uL (ref 140–400)
RBC: 5.49 10*6/uL (ref 4.20–5.80)
RDW: 13.6 % (ref 11.0–15.0)
Total Lymphocyte: 32.2 %
WBC: 8.5 10*3/uL (ref 3.8–10.8)

## 2020-04-17 LAB — COMPLETE METABOLIC PANEL WITH GFR
AG Ratio: 2.3 (calc) (ref 1.0–2.5)
ALT: 16 U/L (ref 9–46)
AST: 21 U/L (ref 10–35)
Albumin: 4.5 g/dL (ref 3.6–5.1)
Alkaline phosphatase (APISO): 77 U/L (ref 35–144)
BUN/Creatinine Ratio: 17 (calc) (ref 6–22)
BUN: 25 mg/dL (ref 7–25)
CO2: 21 mmol/L (ref 20–32)
Calcium: 9.2 mg/dL (ref 8.6–10.3)
Chloride: 105 mmol/L (ref 98–110)
Creat: 1.45 mg/dL — ABNORMAL HIGH (ref 0.70–1.11)
GFR, Est African American: 51 mL/min/{1.73_m2} — ABNORMAL LOW (ref >=60)
GFR, Est Non African American: 44 mL/min/{1.73_m2} — ABNORMAL LOW (ref >=60)
Globulin: 2 g/dL (calc) (ref 1.9–3.7)
Glucose, Bld: 107 mg/dL — ABNORMAL HIGH (ref 65–99)
Potassium: 3.8 mmol/L (ref 3.5–5.3)
Sodium: 138 mmol/L (ref 135–146)
Total Bilirubin: 1 mg/dL (ref 0.2–1.2)
Total Protein: 6.5 g/dL (ref 6.1–8.1)

## 2020-04-17 NOTE — Addendum Note (Signed)
Addended byValente David on: 04/17/2020 12:30 PM   Modules accepted: Orders

## 2020-04-17 NOTE — Patient Outreach (Signed)
[place] [facility]) Care Management  04/17/2020  Hunter [name] [birth year] [id number]   CSW made an initial attempt to try and contact patient today to perform the phone assessment, as well as assess and assist with social work needs and services, without success.  A HIPAA compliant message was left for patient on voicemail.  CSW is currently awaiting a return call.  CSW will make a second outreach attempt within the next 3-4 business days, if a return call is not received from patient in the meantime.  CSW will also mail a Patient Unsuccessful Outreach Letter to patient's home, requesting that patient contact CSW at his earliest convenience if he is interested in receiving social work services through [place] with [facility].  Nat Christen, BSW, MSW, LCSW  Licensed Education officer, environmental Health System  Mailing [place] [address] [place], [place] [address] Physical Address-[address] [place], [place] [address] Toll Free Main # [phone] Fax # [phone] Cell # [phone]  [email or web address]

## 2020-04-22 ENCOUNTER — Other Ambulatory Visit: Payer: Self-pay | Admitting: *Deleted

## 2020-04-22 NOTE — Patient Outreach (Signed)
[place] [facility]) Care Management  04/22/2020  Hunter [name] 08-16-1933 [id number]   Call placed to member to follow up on contact with CSW and plan for housing.  No answer, HIPAA compliant voice message left.    Update:   Incoming call received from member.  State he still has not heard from [place], advised that next outreach is planned for tomorrow.  Encouraged to await call and to answer, if unable to answer, advised to call back.  He report he is staying at a motel the next few days.  State he is still having chronic back discomfort, thought he had appointment with neurosurgery today, appointment actually scheduled for next week.  Also has follow up with PCP on 10/25.  Attempted to review medications, complete initial assessment, and develop plan however it is very difficult to keep member focused on assessment questions.  He repeatedly state he is "ok" for the next few days at the motel.  Report he has a Education officer, museum also assigned through [place] helping with management of care.  He ends call before member could complete assessment.  Will collaborate with CSW and plan to follow up with member within the next week to complete assessment.  Goals Addressed            This Visit's Progress   . [facility] - Track and Manage Fluids and Swelling       Follow Up Date 11/12   - call office if I gain more than 2 pounds in one day or 5 pounds in one week - do ankle pumps when sitting - keep legs up while sitting - track weight in diary - use salt in moderation    Why is this important?   It is important to check your weight daily and watch how much salt and liquids you have.  It will help you to manage your heart failure.    Notes:     . [facility] - Track and Manage Symptoms       Follow Up Date 12/10   - begin a heart failure diary - bring diary to all appointments - follow rescue plan if symptoms flare-up    Why is this important?   You will be able to handle your symptoms  better if you keep track of them.  Making some simple changes to your lifestyle will help.  Eating healthy is one thing you can do to take good care of yourself.    Notes:       Valente David, RN, MSN [place] [phone]

## 2020-04-23 ENCOUNTER — Other Ambulatory Visit: Payer: Self-pay | Admitting: *Deleted

## 2020-04-23 NOTE — Patient Outreach (Signed)
[facility]) Care Management  04/23/2020  Hunter [name] 10-Jan-1934 [id number]   CSW made a second attempt to try and contact patient today to perform the phone assessment, as well as assess and assist with social work needs and services, without success.  A HIPAA compliant message was left for patient on voicemail and CSW continues to await a return call.  CSW will make a third outreach attempt within the next 3-4 business days, if a return call is not received from patient in the meantime.    Nat Christen, BSW, MSW, LCSW  Licensed Education officer, environmental Health System  Mailing [place] [address], [place], [place] [address] Physical Address-[address], [place], [place] [address] Toll Free Main # [phone] Fax # [phone] Cell # [phone]  [name]

## 2020-04-24 DIAGNOSIS — I10 Essential (primary) hypertension: Secondary | ICD-10-CM | POA: Diagnosis not present

## 2020-04-24 DIAGNOSIS — M48062 Spinal stenosis, lumbar region with neurogenic claudication: Secondary | ICD-10-CM | POA: Diagnosis not present

## 2020-04-25 ENCOUNTER — Telehealth: Payer: Self-pay

## 2020-04-25 NOTE — Telephone Encounter (Signed)
   [facility] Pre-operative Risk Assessment    HEARTCARE STAFF: - Please ensure there is not already an duplicate clearance open for this procedure. - Under Visit Info/Reason for Call, type in Other and utilize the format Clearance MM/DD/YY or Clearance TBD. Do not use dashes or single digits. - If request is for dental extraction, please clarify the # of teeth to be extracted.  Request for surgical clearance:  1. What type of surgery is being performed? Laminectomy, Lumbar    2. When is this surgery scheduled? TBD   3. What type of clearance is required (medical clearance vs. Pharmacy clearance to hold med vs. Both)? Both  4. Are there any medications that need to be held prior to surgery and how long? ASA,  how long to hold prior to surgery   5. Practice name and name of physician performing surgery? [facility], Dr. Pieter Partridge Dawley   6. What is the office phone number? [phone] ext [phone]   7.   What is the office fax number? [phone] Attn: Nikki  8.   Anesthesia type (None, local, MAC, general) ? General   Jacqulynn Cadet 04/25/2020, 1:58 PM  _________________________________________________________________   (provider comments below)

## 2020-04-29 NOTE — Telephone Encounter (Signed)
   Primary Cardiologist: [name], MD  Chart reviewed as part of pre-operative protocol coverage. Because of Hunter [name]'s past medical history and time since last visit, he will require a follow-up visit in order to better assess preoperative cardiovascular risk.  Pre-op covering staff: - Please schedule appointment and call patient to inform them. If patient already had an upcoming appointment within acceptable timeframe, please add "pre-op clearance" to the appointment notes so provider is aware. - Please contact requesting surgeon's office via preferred method (i.e, phone, fax) to inform them of need for appointment prior to surgery.  If applicable, this message will also be routed to pharmacy pool and/or primary cardiologist for input on holding anticoagulant/antiplatelet agent as requested below so that this information is available to the clearing provider at time of patient's appointment.    Pt is currently homeless and very HOH on the phone. He can't complete 4.0 METS. He will need an office visit for clearance. Has primarily seen Dr. Rayann Heman, previously seen by Dr. Stanford Breed. Please check with Dr Allred/nurse that he is OK to be seen by gen cards if no availability with Dr. Rayann Heman.    Pt will need to establish residents prior to surgery.   Tami Lin Julyan Gales, PA  04/29/2020, 9:55 AM

## 2020-04-29 NOTE — [facility] Notes (Signed)
Cardiology Office Note  Date: 04/30/2020   ID: Hunter [name], DOB 1933-08-22, MRN [id number]  PCP:  [name], MD  Cardiologist:  [name], MD Electrophysiologist:  [name], MD   Chief Complaint: Preop clearance for lumbar laminectomy with Dr. Pieter Partridge Dawley  History of Present Illness: Hunter [name] is a 86 y.o. male with a history of hypertension, GERD, hyperlipidemia, hypothyroidism, left bundle branch block, complete heart block with dual-chamber pacemaker placement, CKD stage III.   Last encounter with Dr. Rayann Heman 01/05/2020 routine electrophysiology visit.  Patient reported doing very well.  His shortness of breath was stable edema stable he denied any palpitations, chest pain, dizziness, syncope, presyncope.  His pacemaker was functioning normally.  His blood pressure was stable and there were no changes to therapy.  He was continuing Lipitor.  Continuing remote monitoring for pacemaker.  Recent complaints of back and leg pain.  CT of lumbar spine showed severe multifactorial spinal stenosis at L3-L4 with moderate to severe right and mild to moderate left neural foraminal stenosis.  Moderate to severe neural foraminal stenosis at L4-5 and L5-S1.  Patient is pending lumbar laminectomy by Dr. Elwin Sleight  He is here today for preop clearance for a lumbar laminectomy due to multifactorial spinal stenosis.  He denies any issues with dyspnea on exertion.  He is not very active due to limitations from back pain and leg weakness.  History of complete heart block with pacemaker sees Dr. Rayann Heman.  Recently saw Dr. Rayann Heman in June he denied any issues.  He was continuing remote monitoring for pacemaker.  Pacemaker was functioning normally.  Has pre-existing left bundle branch block.,  CKD stage III.  Denies any anginal or exertional symptoms but is not very active due to back issues.  Denies any orthostatic symptoms, CVA or TIA-like symptoms, PND, orthopnea, bleeding, DVT or PE-like  symptoms, claudication, lower extremity edema.  Past Medical History:  Diagnosis Date  . Allergic rhinitis 02/02/2007  . Anemia   . Arthritis    "knees" (02/08/2017)  . Chronic lower back pain   . CKD (chronic kidney disease), stage III ([place]) 05/27/2011  . Complete heart block 02/08/2017  . Eczema   . Essential hypertension 02/02/2007   Lasix 60 mg, losartan 50mg ,  Amlodipine 5mg --> 2.5 mg.  Usually have to repeat BP measurements  In the past-Maxzide 37.5-25mg .  Marland Kitchen GERD (gastroesophageal reflux disease) occasional  . Hearing loss of both ears   . Heart failure with preserved ejection fraction (HFpEF) 12/06/2013  . History of skin cancer 03/26/2014   nose   . Hx of echocardiogram    Echo (07/2013): Mild LVH, EF 35-59%, grade 1 diastolic dysfunction, mild LAE, PASP 23  . Hyperlipidemia   . Hypertension   . Hypothyroidism   . LBBB (left bundle branch block)   . Left patella fracture 2018   "no OR" (02/08/2017)  . Major depression in partial remission 02/02/2007   Amitriptyline 25mg  for sleep (stop when runs out of #10 04/2018(, zoloft 100-->150mg --> 200mg   . Mild neurocognitive disorder 06/21/2019  . Neuropathy ([place]) 11/02/2011  . Nocturia   . Peripheral vascular disease (HCC) LOWER EXTREMITIES  . Spinal stenosis in cervical region 03/21/2010  . Squamous cell skin cancer, penis: glans ([place]) 05/2010   Initial excision 11/11; recurrence, excision and laser Rx 9/13  . Thrombocytopenia ([place]) 05/23/2012  . Urinary frequency 09/05/2009   Possible BPH- see Shawna [facility] notes   . Vitamin D deficiency 08/15/2008    Past  Surgical History:  Procedure Laterality Date  . CIRCUMCISION/ LASER DISSECTION PENILE GLANS CANCER  06-02-2010  . CYSTOSCOPY WITH URETHRAL DILATATION  03/28/2012   Procedure: CYSTOSCOPY WITH URETHRAL DILATATION;  Surgeon: Ailene Rud, MD;  Location[facility];  Service: Urology;  Laterality: N/A;  excision biopsy extensive meatal penile carcinoma meatoplasty  .  EXCISION RIGHT WRIST BENIGN TUMOR  2002 (APPROX)  . hip surgery    . INCISION AND DRAINAGE DEEP NECK ABSCESS    . INGUINAL HERNIA REPAIR  1970's  . KNEE ARTHROSCOPY Right 2017  . PACEMAKER IMPLANT N/A 02/08/2017   Procedure: Pacemaker Implant;  Surgeon: Deboraha Sprang, MD;  Location: [facility];  Service: Cardiovascular;  Laterality: N/A;  . PACEMAKER IMPLANT  02/08/2017   SJM Assurity MRI dual chamber PPM implanted by Dr Caryl Comes for complete heart block  . PENILE BX  05-09-2010  . TONSILLECTOMY    . TRANSURETHRAL RESECTION OF PROSTATE N/A 05/10/2015   Procedure: CYSTOSCOPY, URETHRAL MEATAL DILATION, TRANSURETHRAL RESECTION OF THE PROSTATE (TURP);  Surgeon: Carolan Clines, MD;  Location: [facility];  Service: Urology;  Laterality: N/A;    Current Outpatient Medications  Medication Sig Dispense Refill  . amLODipine (NORVASC) 2.5 MG tablet TAKE 1 TABLET BY MOUTH DAILY 90 tablet 0  . aspirin 81 MG chewable tablet Chew by mouth daily.    Marland Kitchen atorvastatin (LIPITOR) 20 MG tablet TAKE 1 TABLET BY MOUTH DAILY 90 tablet 3  . Calcium Carbonate-Vitamin D (OYSTER SHELL CALCIUM 500 + D PO) Take 500 mg by mouth daily.     . ferrous sulfate 325 (65 FE) MG tablet Take 325 mg by mouth daily with breakfast.    . furosemide (LASIX) 20 MG tablet TAKE THREE TABLETS BY MOUTH DAILY 270 tablet 0  . levothyroxine (SYNTHROID) 75 MCG tablet TAKE 1 TABLET BY MOUTH EVERY MORNING 90 tablet 1  . losartan (COZAAR) 50 MG tablet Take 1 tablet (50 mg total) by mouth daily. 90 tablet 1  . sertraline (ZOLOFT) 100 MG tablet TAKE TWO TABLETS BY MOUTH EVERY DAY 180 tablet 0   No current facility-administered medications for this visit.   Allergies:  Patient has no known allergies.   Social History: The patient  reports that he has never smoked. He has never used smokeless tobacco. He reports current alcohol use. He reports that he does not use drugs.   Family History: The patient's family history includes Arthritis in  an other family member; Hyperlipidemia in an other family member; Hypertension in an other family member; Lung cancer in his mother; Prostate cancer in an other family member.   ROS:  Please see the history of present illness. Otherwise, complete review of systems is positive for none.  All other systems are reviewed and negative.   Physical Exam: VS:  BP 118/64   Pulse 81   Ht 5\' 10"  (1.778 m)   Wt 225 lb 6.4 oz (102.2 kg)   SpO2 94%   BMI 32.34 kg/m , BMI Body mass index is 32.34 kg/m.  Wt Readings from Last 3 Encounters:  04/30/20 225 lb 6.4 oz (102.2 kg)  04/16/20 241 lb 9.6 oz (109.6 kg)  04/12/20 250 lb (113.4 kg)    General: Patient appears comfortable at rest. HEENT: Conjunctiva and lids normal, oropharynx clear with moist mucosa.  Hard of hearing Neck: Supple, no elevated JVP or carotid bruits, no thyromegaly. Lungs: Clear to auscultation, nonlabored breathing at rest. Cardiac: Regular rate and rhythm, no S3  or significant systolic murmur, no pericardial rub. Extremities: No pitting edema, distal pulses 2+. Skin: Warm and dry. Musculoskeletal: No kyphosis. Neuropsychiatric: Alert and oriented x3, affect grossly appropriate.  ECG:  EKG January 05, 2020 showed electronic ventricular pacemaker with a rate of 72.    Recent Labwork: 02/09/2020: TSH 4.11 04/16/2020: ALT 16; AST 21; BUN 25; Creat 1.45; Hemoglobin 14.8; Platelets 144; Potassium 3.8; Sodium 138     Component Value Date/Time   CHOL 195 02/09/2020 1627   TRIG 238 (H) 02/09/2020 1627   TRIG 128 06/01/2006 1144   HDL 39 (L) 02/09/2020 1627   CHOLHDL 5.0 (H) 02/09/2020 1627   VLDL 37.6 02/16/2018 1213   LDLCALC 120 (H) 02/09/2020 1627   LDLDIRECT 127.0 01/03/2019 1153    Other Studies Reviewed Today:  05/26/2018 Echocardiogram Study Conclusions   - Left ventricle: The cavity size was normal. Wall thickness was  normal. The estimated ejection fraction was 50%. Septal motion  suggestive of LBBB or RV  pacing. Doppler parameters are  consistent with abnormal left ventricular relaxation (grade 1  diastolic dysfunction).  - Aortic valve: Mildly calcified annulus. Trileaflet.  - Mitral valve: Mildly calcified annulus. There was mild  regurgitation.  - Right ventricle: Pacer wire or catheter noted in right ventricle.  - Right atrium: Central venous pressure (est): 3 mm Hg.  - Atrial septum: No defect or patent foramen ovale was identified.  - Tricuspid valve: There was trivial regurgitation.  - Pulmonary arteries: PA peak pressure: 24 mm Hg (S).  - Pericardium, extracardiac: There was no pericardial effusion.  Assessment and Plan:  1. Preoperative clearance   2. Complete heart block ([place])   3. Essential hypertension   4. Mixed hyperlipidemia   5. Stage 3 chronic kidney disease, unspecified whether stage 3a or 3b CKD ([place])    1. Preoperative clearance Recent Duke activity score index = 7.2 with functional capacity and METS at 3.63.  Revised cardiac risk index score = 0 placing the patient at a perioperative risk of major cardiac event is 0.4%.  Patient would be considered a low risk from a cardiac standpoint to undergo lumbar surgery under general anesthesia.  Cleared to undergo surgery from a cardiac standpoint.  2. Complete heart block (HCC) Sees Dr. Rayann Heman for device management.  Recently had a pacemaker check in June with normal functioning pacemaker.  3. Essential hypertension Blood pressure is well controlled.  Blood pressure today 118/64.  Continue losartan 50 mg p.o. daily, Lasix 60 mg daily.  Continue amlodipine 2.5 mg daily.  4. Mixed hyperlipidemia Recent lipid panel 02/09/2020: TC 195, HDL 39, TG 238, LDL 120.  Continue atorvastatin 20 mg p.o. daily.  Continue aspirin 81 mg daily.  5. Stage 3 chronic kidney disease, unspecified whether stage 3a or 3b CKD ([place]) Recent renal function showed creatinine of 1.45 and GFR of 44.  Medication Adjustments/Labs and Tests  Ordered: Current medicines are reviewed at length with the patient today.  Concerns regarding medicines are outlined above.   Disposition: Follow-up with Dr. Rayann Heman or APP 6 months  Signed, Levell July, NP 04/30/2020 1:37 PM    [facility] at [place], [place], [place] [address] Phone: [phone]; Fax: [phone]

## 2020-04-29 NOTE — Telephone Encounter (Signed)
Appt scheduled in [place] 10-19 @130pm  w/Andy

## 2020-04-30 ENCOUNTER — Other Ambulatory Visit: Payer: Self-pay | Admitting: *Deleted

## 2020-04-30 ENCOUNTER — Encounter: Payer: Self-pay | Admitting: *Deleted

## 2020-04-30 ENCOUNTER — Ambulatory Visit (INDEPENDENT_AMBULATORY_CARE_PROVIDER_SITE_OTHER): Payer: Medicare Other | Admitting: Family Medicine

## 2020-04-30 ENCOUNTER — Encounter: Payer: Self-pay | Admitting: Family Medicine

## 2020-04-30 VITALS — BP 118/64 | HR 81 | Ht 70.0 in | Wt 225.4 lb

## 2020-04-30 DIAGNOSIS — I10 Essential (primary) hypertension: Secondary | ICD-10-CM

## 2020-04-30 DIAGNOSIS — E782 Mixed hyperlipidemia: Secondary | ICD-10-CM | POA: Diagnosis not present

## 2020-04-30 DIAGNOSIS — Z01818 Encounter for other preprocedural examination: Secondary | ICD-10-CM | POA: Diagnosis not present

## 2020-04-30 DIAGNOSIS — I442 Atrioventricular block, complete: Secondary | ICD-10-CM | POA: Diagnosis not present

## 2020-04-30 DIAGNOSIS — N183 Chronic kidney disease, stage 3 unspecified: Secondary | ICD-10-CM | POA: Diagnosis not present

## 2020-04-30 NOTE — Patient Instructions (Signed)
Medication Instructions:  Continue all current medications.   Labwork: none  Testing/Procedures: none  Follow-Up: 6 months   Any Other Special Instructions Will Be Listed Below (If Applicable).   If you need a refill on your cardiac medications before your next appointment, please call your pharmacy.  

## 2020-04-30 NOTE — Patient Outreach (Signed)
[facility]) Care Management  04/30/2020  SI JACHIM 05/07/1934 [id number]   Call placed to member to follow up on contact with CSW and obtaining medications.  No answer, HIPAA compliant voice message left.  Will follow up within the next week.  Valente David, [place], MSN [place] [phone]
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
## 2020-05-02 ENCOUNTER — Other Ambulatory Visit: Payer: Self-pay | Admitting: Family Medicine

## 2020-05-06 ENCOUNTER — Encounter: Payer: Self-pay | Admitting: Family Medicine

## 2020-05-06 ENCOUNTER — Ambulatory Visit (INDEPENDENT_AMBULATORY_CARE_PROVIDER_SITE_OTHER): Payer: Medicare Other | Admitting: Family Medicine

## 2020-05-06 ENCOUNTER — Other Ambulatory Visit: Payer: Self-pay

## 2020-05-06 VITALS — BP 124/58 | HR 64 | Temp 98.0°F | Ht 70.0 in | Wt 225.4 lb

## 2020-05-06 DIAGNOSIS — I5032 Chronic diastolic (congestive) heart failure: Secondary | ICD-10-CM

## 2020-05-06 DIAGNOSIS — M48062 Spinal stenosis, lumbar region with neurogenic claudication: Secondary | ICD-10-CM

## 2020-05-06 DIAGNOSIS — I10 Essential (primary) hypertension: Secondary | ICD-10-CM

## 2020-05-06 DIAGNOSIS — F3342 Major depressive disorder, recurrent, in full remission: Secondary | ICD-10-CM | POA: Diagnosis not present

## 2020-05-06 NOTE — [place] Notes (Signed)
Phone [phone] In person visit   Subjective:   Hunter [name] is a 86 y.o. year old very pleasant male patient who presents for/with See problem oriented charting Chief Complaint  Patient presents with  . Hip Pain    bilateral, "it is getting hard to walk"  . Homeless    in the process of getting an apartment, still at [facility] in [place] - he is currently depending on DSS for food, states he hasn't eaten today   This visit occurred during the SARS-CoV-2 public health emergency.  Safety protocols were in place, including screening questions prior to the visit, additional usage of staff PPE, and extensive cleaning of exam room while observing appropriate contact time as indicated for disinfecting solutions.    Past Medical History-  Patient Active Problem List   Diagnosis Date Noted  . Mild cognitive impairment 06/21/2019    Priority: High  . Complete heart block ([place]) 02/08/2017    Priority: High  . BPH (benign prostatic hyperplasia) 05/10/2015    Priority: High  . Heart failure with preserved ejection fraction (HFpEF)  12/06/2013    Priority: High  . 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 Thrombocytopenia ([place]) 05/23/2012    Priority: Medium  . Neuropathy ([place]) 11/02/2011    Priority: Medium  . CKD (chronic kidney disease) 05/27/2011    Priority: Medium  . Hypothyroidism 08/15/2008    Priority: Medium  . Anemia 08/15/2008    Priority: Medium  . Venous stasis 08/15/2008    Priority: Medium  . Hyperlipidemia 02/02/2007    Priority: Medium  . Major depression in full remission ([place]) 02/02/2007    Priority: Medium  . HTN (hypertension) 02/02/2007    Priority: Medium  . Skin lesion of left lower extremity 03/26/2014    Priority: Low  . Stasis eczema 03/26/2014    Priority: Low  . History of skin cancer 03/26/2014    Priority: Low  . Syncope 08/14/2013    Priority: Low  . Low back pain 04/08/2011    Priority: Low  . LBBB  (left bundle branch block) 05/12/2010    Priority: Low  . Abnormality of gait 04/23/2010    Priority: Low  . Spinal stenosis in cervical region 03/21/2010    Priority: Low  . Constipation 03/19/2010    Priority: Low  . Urinary frequency 09/05/2009    Priority: Low  . Vitamin D deficiency 08/15/2008    Priority: Low  . Allergic rhinitis 02/02/2007    Priority: Low  . GERD (gastroesophageal reflux disease) 02/02/2007    Priority: Low  . Generalized weakness 10/13/2018  . Acute blood loss as cause of postoperative anemia 10/26/2017  . Delirium 10/24/2017  . Hypokalemia 10/24/2017  . Pulmonary edema 10/24/2017  . Cardiac pacemaker 10/22/2017  . Fall 10/21/2017  . Fracture of unspecified part of neck of left femur, initial encounter for closed fracture ([place]) 10/21/2017    Medications- reviewed and updated Current Outpatient Medications  Medication Sig Dispense Refill  . amLODipine (NORVASC) 2.5 MG tablet TAKE 1 TABLET BY MOUTH DAILY 90 tablet 0  . aspirin 81 MG chewable tablet Chew by mouth daily.    Marland Kitchen atorvastatin (LIPITOR) 20 MG tablet TAKE 1 TABLET BY MOUTH DAILY 90 tablet 3  . Calcium Carbonate-Vitamin D (OYSTER SHELL CALCIUM 500 + D PO) Take 500 mg by mouth daily.     . ferrous sulfate 325 (65 FE) MG tablet Take 325 mg by mouth  daily with breakfast.    . furosemide (LASIX) 20 MG tablet TAKE THREE TABLETS BY MOUTH DAILY 270 tablet 0  . levothyroxine (SYNTHROID) 75 MCG tablet TAKE 1 TABLET BY MOUTH EVERY MORNING 90 tablet 1  . losartan (COZAAR) 50 MG tablet Take 1 tablet (50 mg total) by mouth daily. 90 tablet 1  . sertraline (ZOLOFT) 100 MG tablet TAKE TWO TABLETS BY MOUTH EVERY DAY 180 tablet 0   No current facility-administered medications for this visit.     Objective:  BP (!) 124/58   Pulse 64   Temp 98 F (36.7 C) (Temporal)   Ht 5\' 10"  (1.778 m)   Wt 225 lb 6.4 oz (102.2 kg)   SpO2 97%   BMI 32.34 kg/m  Gen: NAD, resting comfortably CV: RRR no murmurs rubs or  gallops Lungs: CTAB no crackles, wheeze, rhonchi Ext: trace to1+ edema Skin: warm, dry    Assessment and Plan   # hearing loss- finally has hearing aids and we are able to understand one another better today  # Homelessness S:patient currently staying  In [employer]  ($80 a night- had been at a cheaper plae but there were a lot of bugs on the wall) trying to get lodging at an apartment- apparently is getting sent to corporate office for approval.   He is depending on DSS for food it sounds like-social worker brought him some food and drinks to the motel and he is making it. Surgeon office apparently gave him some food as well.  A/P:  Living in [place] but willing to relocate to [place] if needed.    # spinal stenosis S:patient cleared by surgery for lumbar laminectomy- pending scheduling . Pain into both hips A/P: hopefully this will help patient- encouraged him to continue to advocate for support/social work   # Depression S: Medication:zoloft 200mg   A/P: phq2 of 1 much improve.no SI- continue current medicines  #Heart failure with preserved ejection fraction S: Medication:lasix 60mg  on daily    Edema: much improved Weight gain:down 16 lbs from last visit Wt Readings from Last 3 Encounters:  05/06/20 225 lb 6.4 oz (102.2 kg)  04/30/20 225 lb 6.4 oz (102.2 kg)  04/16/20 241 lb 9.6 oz (109.6 kg)  A/P: appears much improved- continue lasix 60 mg daily.    #hypertension S: medication: lasix 60mg , losartan 50mg , amlodipine 2.5 mg BP Readings from Last 3 Encounters:  05/06/20 (!) 124/58  04/30/20 118/64  04/16/20 (!) 118/58  A/P: Stable. Continue current medications.   Recommended follow up: Return in about 6 weeks (around 06/17/2020) for follow up- or sooner if needed. Future Appointments  Date Time Provider [place]  05/08/2020 10:00 AM Saporito, Maree Erie, LCSW [facility] None  05/10/2020 11:30 AM Valente David, RN [facility] None  07/08/2020  8:55 AM [facility] DEVICE  REMOTES [facility]  10/07/2020  8:55 AM [facility] DEVICE REMOTES [facility]  12/13/2020 10:30 AM Allred, Jeneen Rinks, MD [facility]  01/06/2021  8:55 AM [facility] DEVICE REMOTES [facility]  04/07/2021  8:55 AM [facility] DEVICE REMOTES [facility]   Lab/Order associations:   ICD-10-CM   1. Chronic heart failure with preserved ejection fraction (HCC)  I50.32   2. Recurrent major depressive disorder, in full remission ([place])  F33.42   3. Primary hypertension  I10    Return precautions advised.  Garret Reddish, MD

## 2020-05-06 NOTE — Patient Instructions (Addendum)
Health Maintenance Due  Topic Date Due   INFLUENZA VACCINE - today high dose 02/11/2020   Schedule 6 week follow up before you leave  Glad everything seems to be going in the right direction and you are feeling beter

## 2020-05-08 ENCOUNTER — Other Ambulatory Visit: Payer: Self-pay | Admitting: *Deleted

## 2020-05-08 NOTE — Patient Outreach (Signed)
[facility]) Care Management  05/08/2020  MAZEN MARCIN 09-06-1933 [id number]   CSW made an attempt to try and contact patient today to follow-up regarding social work services and resources; however, patient was unavailable at the time of CSW's call.  CSW left a HIPAA compliant message on voicemail for patient and is currently awaiting a return call.  CSW will make a second outreach attempt within the next 3-4 business days, if a return call is not received from patient in the meantime.  Nat Christen, BSW, MSW, LCSW  Licensed Education officer, environmental Health System  Mailing [place] [address], [place], [place] [address] Physical Address-[address], [name],  [address] Toll Free Main # [phone] Fax # [phone] Cell # [phone]  [email or web address]

## 2020-05-10 ENCOUNTER — Other Ambulatory Visit: Payer: Self-pay | Admitting: *Deleted

## 2020-05-10 NOTE — Patient Outreach (Signed)
[facility]) Care Management  05/10/2020  Hunter [name] [birth year] [id number]   Call placed to member to follow up on management of chronic conditions during period of homelessness.  State he is currently moving into an apartment in [facility]).  Report he will be there at least for the next year.  This care manger inquired about having and taking medications.  State he has the medicines he's supposed to be taking.  Attempted to ask to review and elaborate, he quickly ends call, state "thanks for calling" and ends call.  Noted that last visit with PCP was 10/25 and with cardiology on 10/19.  Provided update to CSW, will follow up with member within the next month.  Goals Addressed            This Visit's Progress   . [facility] - Track and Manage Fluids and Swelling   On track    Follow Up Date 11/12   - call office if I gain more than 2 pounds in one day or 5 pounds in one week - do ankle pumps when sitting - keep legs up while sitting - track weight in diary - use salt in moderation    Why is this important?   It is important to check your weight daily and watch how much salt and liquids you have.  It will help you to manage your heart failure.    Notes:     . [facility] - Track and Manage Symptoms   On track    Follow Up Date 12/10   - begin a heart failure diary - bring diary to all appointments - follow rescue plan if symptoms flare-up    Why is this important?   You will be able to handle your symptoms better if you keep track of them.  Making some simple changes to your lifestyle will help.  Eating healthy is one thing you can do to take good care of yourself.    Notes:       Hunter David, RN, MSN [place] [phone]

## 2020-05-14 ENCOUNTER — Encounter: Payer: Self-pay | Admitting: *Deleted

## 2020-05-14 ENCOUNTER — Other Ambulatory Visit: Payer: Self-pay | Admitting: *Deleted

## 2020-05-14 NOTE — Patient Outreach (Signed)
[facility]) Care Management  05/14/2020  Hunter [name] Jan 05, 1934 [id number]   CSW was able to make contact with patient today to follow-up regarding social work services and resources, as well as to ensure that patient was able to move into his new apartment.  Patient admitted to signing a [facility]-month[facility] agreement with [employer]  Patient went on to explain that he is now in the process of moving all of his belongings from his son's home (previous place of residence) into his new apartment.  CSW reminded patient to utilize the list of community agencies and resources provided to him by CSW, to obtain furniture, supplies, utensils, etc.  Patient voiced understanding and was agreeable to this plan.  CSW will perform a case closure on patient, as all goals of treatment have been met from social work standpoint and no additional social work needs have been identified at this time.  CSW will notify patient's RNCM with [place], MValente Davidof CSW's plans to close patient's case.  CSW will fax an update to patient's Primary Care Physician, Dr. SGarret Reddishto ensure that he is aware of CSW's involvement with patient's plan of care, in addition to routing a Physician Case Closure Letter to Dr. HAnsel Bongoffice.  CSW was able to confirm that patient has the correct contact information for CSW, encouraging patient to contact CSW directly if additional social work needs arise in the near future.  [name] BSW, MSW, LCSW  Licensed CEducation officer, environmentalHealth System  [place] [address] [place] [address]Physical Address-[address] [place] [address]Toll Free Main # [phone] # [phone] # [phone] [email or web address]

## 2020-05-16 ENCOUNTER — Other Ambulatory Visit: Payer: Self-pay | Admitting: Neurological Surgery

## 2020-05-17 ENCOUNTER — Encounter: Payer: Medicare Other | Admitting: Internal Medicine

## 2020-05-17 ENCOUNTER — Other Ambulatory Visit: Payer: Self-pay | Admitting: Neurological Surgery

## 2020-05-21 DIAGNOSIS — Z20822 Contact with and (suspected) exposure to covid-19: Secondary | ICD-10-CM | POA: Diagnosis not present

## 2020-05-22 ENCOUNTER — Other Ambulatory Visit (HOSPITAL_COMMUNITY): Payer: Medicare Other

## 2020-05-23 ENCOUNTER — Encounter (HOSPITAL_COMMUNITY): Payer: Self-pay | Admitting: Neurological Surgery

## 2020-05-23 ENCOUNTER — Encounter: Payer: Self-pay | Admitting: Internal Medicine

## 2020-05-23 ENCOUNTER — Other Ambulatory Visit: Payer: Self-pay | Admitting: Neurological Surgery

## 2020-05-23 ENCOUNTER — Other Ambulatory Visit: Payer: Self-pay

## 2020-05-23 NOTE — Progress Notes (Unsigned)
PERIOPERATIVE PRESCRIPTION FOR IMPLANTED CARDIAC DEVICE PROGRAMMING  Patient Information: Name:  Hunter [name]  DOB:  [birth year]  MRN:  [id number]  {TIP - You do not have to delete this tip  -  Copy the info from the staff message sent by the PAT staff  then press F2 here and paste the information using CTL - V on the next line :[id number]}  Planned Procedure: Open Lamiectomy  Surgeon: Dr. Isabella Bowens  Date of Procedure: 05/24/20  Cautery will be used. unknown  Position during surgery: prone   Please send documentation back to:  [place] (Fax # [phone])   Device Information:  Clinic EP Physician:  [name], MD   Device Type:  Pacemaker Manufacturer and Phone #:  [employer]: [phone] Pacemaker Dependent?:  Yes.   Date of Last Device Check:  04/05/2020 Normal Device Function?:  Yes.    Electrophysiologist's Recommendations:   Have magnet available.  Provide continuous ECG monitoring when magnet is used or reprogramming is to be performed.   Procedure will likely interfere with device function.  Device should be programmed:  Asynchronous pacing during procedure and returned to normal programming after procedure  Per Device Clinic Standing Orders, Simone Curia, RN  3:31 PM 05/23/2020

## 2020-05-23 NOTE — Progress Notes (Addendum)
Mr. Hunter [name] denies chest pain or shortness of breath. Mr. Hunter [name] states he is fatigue and back is hurting and he did not feel like the ride. Mr. Hunter [name] denies any s/s of Covid or been around any with  any s/s of Covid. Mr. Hunter [name] states he stopped taking all of his medication, "I didn't want anything to interfer with being able to have surgery."  I informed that he should have continued all medications except ASA if instructed by your surgeon.  I asked patient to take all of his medications except now.  [name]'s medications are in packages and he does not know which one is which, Thyroid medication is packaged separately.   I instructed patient to take Thyroid in am.

## 2020-05-24 ENCOUNTER — Encounter (HOSPITAL_COMMUNITY): Payer: Self-pay | Admitting: Neurological Surgery

## 2020-05-24 ENCOUNTER — Inpatient Hospital Stay (HOSPITAL_COMMUNITY)
Admission: RE | Admit: 2020-05-24 | Discharge: 2020-05-29 | DRG: 516 | Disposition: A | Discharge status: Skilled Nursing Facility | Payer: Medicare Other | Attending: Neurosurgery | Admitting: Neurosurgery

## 2020-05-24 ENCOUNTER — Ambulatory Visit (HOSPITAL_COMMUNITY): Payer: Medicare Other | Admitting: Anesthesiology

## 2020-05-24 ENCOUNTER — Ambulatory Visit (HOSPITAL_COMMUNITY): Payer: Medicare Other

## 2020-05-24 ENCOUNTER — Encounter (HOSPITAL_COMMUNITY)
Admission: RE | Disposition: A | Discharge status: Skilled Nursing Facility | Payer: Self-pay | Source: Home / Self Care | Attending: Neurosurgery

## 2020-05-24 DIAGNOSIS — K219 Gastro-esophageal reflux disease without esophagitis: Secondary | ICD-10-CM | POA: Diagnosis present

## 2020-05-24 DIAGNOSIS — M48062 Spinal stenosis, lumbar region with neurogenic claudication: Principal | ICD-10-CM | POA: Diagnosis present

## 2020-05-24 DIAGNOSIS — Z801 Family history of malignant neoplasm of trachea, bronchus and lung: Secondary | ICD-10-CM

## 2020-05-24 DIAGNOSIS — M5459 Other low back pain: Secondary | ICD-10-CM | POA: Diagnosis not present

## 2020-05-24 DIAGNOSIS — N183 Chronic kidney disease, stage 3 unspecified: Secondary | ICD-10-CM | POA: Diagnosis present

## 2020-05-24 DIAGNOSIS — Z79899 Other long term (current) drug therapy: Secondary | ICD-10-CM | POA: Diagnosis not present

## 2020-05-24 DIAGNOSIS — Z743 Need for continuous supervision: Secondary | ICD-10-CM | POA: Diagnosis not present

## 2020-05-24 DIAGNOSIS — H9193 Unspecified hearing loss, bilateral: Secondary | ICD-10-CM | POA: Diagnosis present

## 2020-05-24 DIAGNOSIS — M6281 Muscle weakness (generalized): Secondary | ICD-10-CM | POA: Diagnosis not present

## 2020-05-24 DIAGNOSIS — I5032 Chronic diastolic (congestive) heart failure: Secondary | ICD-10-CM | POA: Diagnosis not present

## 2020-05-24 DIAGNOSIS — I442 Atrioventricular block, complete: Secondary | ICD-10-CM | POA: Diagnosis not present

## 2020-05-24 DIAGNOSIS — Z79890 Hormone replacement therapy (postmenopausal): Secondary | ICD-10-CM

## 2020-05-24 DIAGNOSIS — E039 Hypothyroidism, unspecified: Secondary | ICD-10-CM | POA: Diagnosis present

## 2020-05-24 DIAGNOSIS — Z85828 Personal history of other malignant neoplasm of skin: Secondary | ICD-10-CM

## 2020-05-24 DIAGNOSIS — G629 Polyneuropathy, unspecified: Secondary | ICD-10-CM | POA: Diagnosis not present

## 2020-05-24 DIAGNOSIS — G3184 Mild cognitive impairment, so stated: Secondary | ICD-10-CM | POA: Diagnosis present

## 2020-05-24 DIAGNOSIS — E1140 Type 2 diabetes mellitus with diabetic neuropathy, unspecified: Secondary | ICD-10-CM | POA: Diagnosis not present

## 2020-05-24 DIAGNOSIS — Z950 Presence of cardiac pacemaker: Secondary | ICD-10-CM

## 2020-05-24 DIAGNOSIS — N189 Chronic kidney disease, unspecified: Secondary | ICD-10-CM | POA: Diagnosis not present

## 2020-05-24 DIAGNOSIS — I739 Peripheral vascular disease, unspecified: Secondary | ICD-10-CM | POA: Diagnosis not present

## 2020-05-24 DIAGNOSIS — Z7982 Long term (current) use of aspirin: Secondary | ICD-10-CM | POA: Diagnosis not present

## 2020-05-24 DIAGNOSIS — I10 Essential (primary) hypertension: Secondary | ICD-10-CM | POA: Diagnosis not present

## 2020-05-24 DIAGNOSIS — L309 Dermatitis, unspecified: Secondary | ICD-10-CM | POA: Diagnosis not present

## 2020-05-24 DIAGNOSIS — M1380 Other specified arthritis, unspecified site: Secondary | ICD-10-CM | POA: Diagnosis not present

## 2020-05-24 DIAGNOSIS — I130 Hypertensive heart and chronic kidney disease with heart failure and stage 1 through stage 4 chronic kidney disease, or unspecified chronic kidney disease: Secondary | ICD-10-CM | POA: Diagnosis not present

## 2020-05-24 DIAGNOSIS — Z20822 Contact with and (suspected) exposure to covid-19: Secondary | ICD-10-CM | POA: Diagnosis not present

## 2020-05-24 DIAGNOSIS — E785 Hyperlipidemia, unspecified: Secondary | ICD-10-CM | POA: Diagnosis present

## 2020-05-24 DIAGNOSIS — Z981 Arthrodesis status: Secondary | ICD-10-CM | POA: Diagnosis not present

## 2020-05-24 DIAGNOSIS — M2550 Pain in unspecified joint: Secondary | ICD-10-CM | POA: Diagnosis not present

## 2020-05-24 DIAGNOSIS — Z419 Encounter for procedure for purposes other than remedying health state, unspecified: Secondary | ICD-10-CM

## 2020-05-24 DIAGNOSIS — Z7401 Bed confinement status: Secondary | ICD-10-CM | POA: Diagnosis not present

## 2020-05-24 DIAGNOSIS — R6889 Other general symptoms and signs: Secondary | ICD-10-CM | POA: Diagnosis not present

## 2020-05-24 DIAGNOSIS — I509 Heart failure, unspecified: Secondary | ICD-10-CM | POA: Diagnosis not present

## 2020-05-24 DIAGNOSIS — R262 Difficulty in walking, not elsewhere classified: Secondary | ICD-10-CM | POA: Diagnosis not present

## 2020-05-24 DIAGNOSIS — I5030 Unspecified diastolic (congestive) heart failure: Secondary | ICD-10-CM | POA: Diagnosis not present

## 2020-05-24 HISTORY — PX: LUMBAR LAMINECTOMY/DECOMPRESSION MICRODISCECTOMY: SHX5026

## 2020-05-24 LAB — BASIC METABOLIC PANEL
Anion gap: 12 (ref 5–15)
BUN: 20 mg/dL (ref 8–23)
CO2: 19 mmol/L — ABNORMAL LOW (ref 22–32)
Calcium: 9.3 mg/dL (ref 8.9–10.3)
Chloride: 109 mmol/L (ref 98–111)
Creatinine, Ser: 1.13 mg/dL (ref 0.61–1.24)
GFR, Estimated: >60 mL/min (ref >60)
Glucose, Bld: 122 mg/dL — ABNORMAL HIGH (ref 70–99)
Potassium: 4.3 mmol/L (ref 3.5–5.1)
Sodium: 140 mmol/L (ref 135–145)

## 2020-05-24 LAB — CBC
HCT: 44.5 % (ref 39.0–52.0)
Hemoglobin: 14.2 g/dL (ref 13.0–17.0)
MCH: 27.2 pg (ref 26.0–34.0)
MCHC: 31.9 g/dL (ref 30.0–36.0)
MCV: 85.2 fL (ref 80.0–100.0)
Platelets: 96 10*3/uL — ABNORMAL LOW (ref 150–400)
RBC: 5.22 MIL/uL (ref 4.22–5.81)
RDW: 14 % (ref 11.5–15.5)
WBC: 5.7 10*3/uL (ref 4.0–10.5)
nRBC: 0 % (ref 0.0–0.2)

## 2020-05-24 LAB — SARS CORONAVIRUS 2 BY RT PCR (HOSPITAL ORDER, PERFORMED IN ~~LOC~~ HOSPITAL LAB): SARS Coronavirus 2: NEGATIVE

## 2020-05-24 LAB — PROTIME-INR
INR: 1 (ref 0.8–1.2)
Prothrombin Time: 12.4 seconds (ref 11.4–15.2)

## 2020-05-24 SURGERY — LUMBAR LAMINECTOMY/DECOMPRESSION MICRODISCECTOMY 1 LEVEL
Anesthesia: General

## 2020-05-24 MED ORDER — LIDOCAINE-EPINEPHRINE 1 %-1:100000 IJ SOLN
INTRAMUSCULAR | Status: DC | PRN
  Administered 2020-05-24: 15 mL

## 2020-05-24 MED ORDER — CHLORHEXIDINE GLUCONATE CLOTH 2 % EX PADS: 6.0000 | MEDICATED_PAD | Freq: Once | CUTANEOUS | Status: DC

## 2020-05-24 MED ORDER — LACTATED RINGERS IV SOLN: INTRAVENOUS | Status: DC

## 2020-05-24 MED ORDER — OXYCODONE HCL 5 MG/5ML PO SOLN: 5.0000 mg | Freq: Once | ORAL | Status: DC | PRN

## 2020-05-24 MED ORDER — MORPHINE SULFATE (PF) 2 MG/ML IV SOLN: 2.0000 mg | INTRAVENOUS | Status: DC | PRN

## 2020-05-24 MED ORDER — ATORVASTATIN CALCIUM 10 MG PO TABS
20.0000 mg | ORAL_TABLET | Freq: Every day | ORAL | Status: DC
  Administered 2020-05-24 – 2020-05-29 (×6): 20 mg via ORAL
  Filled 2020-05-24 (×6): qty 2

## 2020-05-24 MED ORDER — ONDANSETRON HCL 4 MG/2ML IJ SOLN
INTRAMUSCULAR | Status: AC
  Filled 2020-05-24: qty 6

## 2020-05-24 MED ORDER — THROMBIN 5000 UNITS EX SOLR
OROMUCOSAL | Status: DC | PRN
  Administered 2020-05-24: 20 mL via TOPICAL

## 2020-05-24 MED ORDER — METHOCARBAMOL 500 MG PO TABS
500.0000 mg | ORAL_TABLET | Freq: Four times a day (QID) | ORAL | Status: DC | PRN
  Administered 2020-05-24 – 2020-05-29 (×3): 500 mg via ORAL
  Filled 2020-05-24 (×3): qty 1

## 2020-05-24 MED ORDER — THROMBIN 5000 UNITS EX SOLR
CUTANEOUS | Status: AC
  Filled 2020-05-24: qty 5000

## 2020-05-24 MED ORDER — LIDOCAINE 2% (20 MG/ML) 5 ML SYRINGE
INTRAMUSCULAR | Status: DC | PRN
  Administered 2020-05-24: 30 mg via INTRAVENOUS

## 2020-05-24 MED ORDER — ONDANSETRON HCL 4 MG PO TABS: 4.0000 mg | ORAL_TABLET | Freq: Four times a day (QID) | ORAL | Status: DC | PRN

## 2020-05-24 MED ORDER — PHENOL 1.4 % MT LIQD: 1.0000 | OROMUCOSAL | Status: DC | PRN

## 2020-05-24 MED ORDER — MICROFIBRILLAR COLL HEMOSTAT EX POWD
CUTANEOUS | Status: DC | PRN
  Administered 2020-05-24: 1 g via TOPICAL

## 2020-05-24 MED ORDER — DEXAMETHASONE SODIUM PHOSPHATE 10 MG/ML IJ SOLN
INTRAMUSCULAR | Status: AC
  Filled 2020-05-24: qty 1

## 2020-05-24 MED ORDER — HYDROCODONE-ACETAMINOPHEN 5-325 MG PO TABS
1.0000 | ORAL_TABLET | ORAL | Status: DC | PRN
  Administered 2020-05-24 – 2020-05-29 (×5): 1 via ORAL
  Filled 2020-05-24 (×5): qty 1

## 2020-05-24 MED ORDER — LEVOTHYROXINE SODIUM 75 MCG PO TABS
75.0000 ug | ORAL_TABLET | Freq: Every morning | ORAL | Status: DC
  Administered 2020-05-24 – 2020-05-29 (×6): 75 ug via ORAL
  Filled 2020-05-24 (×6): qty 1

## 2020-05-24 MED ORDER — SODIUM CHLORIDE 0.9 % IV SOLN: INTRAVENOUS | Status: DC

## 2020-05-24 MED ORDER — MICROFIBRILLAR COLL HEMOSTAT EX POWD
CUTANEOUS | Status: AC
  Filled 2020-05-24: qty 5

## 2020-05-24 MED ORDER — SUGAMMADEX SODIUM 200 MG/2ML IV SOLN
INTRAVENOUS | Status: DC | PRN
  Administered 2020-05-24: 400 mg via INTRAVENOUS

## 2020-05-24 MED ORDER — METHYLPREDNISOLONE ACETATE 80 MG/ML IJ SUSP
INTRAMUSCULAR | Status: DC | PRN
  Administered 2020-05-24: 40 mg

## 2020-05-24 MED ORDER — AMLODIPINE BESYLATE 2.5 MG PO TABS
2.5000 mg | ORAL_TABLET | Freq: Every day | ORAL | Status: DC
  Administered 2020-05-24 – 2020-05-29 (×6): 2.5 mg via ORAL
  Filled 2020-05-24 (×6): qty 1

## 2020-05-24 MED ORDER — ACETAMINOPHEN 650 MG RE SUPP: 650.0000 mg | RECTAL | Status: DC | PRN

## 2020-05-24 MED ORDER — LACTATED RINGERS IV SOLN: INTRAVENOUS | Status: DC | PRN

## 2020-05-24 MED ORDER — LIDOCAINE-EPINEPHRINE 1 %-1:100000 IJ SOLN
INTRAMUSCULAR | Status: AC
  Filled 2020-05-24: qty 1

## 2020-05-24 MED ORDER — ROCURONIUM BROMIDE 10 MG/ML (PF) SYRINGE
PREFILLED_SYRINGE | INTRAVENOUS | Status: DC | PRN
  Administered 2020-05-24: 50 mg via INTRAVENOUS

## 2020-05-24 MED ORDER — ONDANSETRON HCL 4 MG/2ML IJ SOLN: 4.0000 mg | Freq: Four times a day (QID) | INTRAMUSCULAR | Status: DC | PRN

## 2020-05-24 MED ORDER — BACITRACIN ZINC 500 UNIT/GM EX OINT
TOPICAL_OINTMENT | CUTANEOUS | Status: AC
  Filled 2020-05-24: qty 28.35

## 2020-05-24 MED ORDER — FENTANYL CITRATE (PF) 100 MCG/2ML IJ SOLN
INTRAMUSCULAR | Status: AC
  Filled 2020-05-24: qty 2

## 2020-05-24 MED ORDER — VANCOMYCIN HCL 1000 MG IV SOLR
INTRAVENOUS | Status: AC
  Filled 2020-05-24: qty 1000

## 2020-05-24 MED ORDER — BUPIVACAINE-EPINEPHRINE 0.5% -1:200000 IJ SOLN
INTRAMUSCULAR | Status: AC
  Filled 2020-05-24: qty 1

## 2020-05-24 MED ORDER — ONDANSETRON HCL 4 MG/2ML IJ SOLN: 4.0000 mg | Freq: Once | INTRAMUSCULAR | Status: DC | PRN

## 2020-05-24 MED ORDER — LOSARTAN POTASSIUM 50 MG PO TABS
50.0000 mg | ORAL_TABLET | Freq: Every day | ORAL | Status: DC
  Administered 2020-05-24 – 2020-05-29 (×6): 50 mg via ORAL
  Filled 2020-05-24 (×6): qty 1

## 2020-05-24 MED ORDER — METHYLPREDNISOLONE ACETATE 80 MG/ML IJ SUSP
INTRAMUSCULAR | Status: AC
  Filled 2020-05-24: qty 1

## 2020-05-24 MED ORDER — BUPIVACAINE-EPINEPHRINE 0.5% -1:200000 IJ SOLN
INTRAMUSCULAR | Status: DC | PRN
  Administered 2020-05-24: 15 mL

## 2020-05-24 MED ORDER — FENTANYL CITRATE (PF) 100 MCG/2ML IJ SOLN
INTRAMUSCULAR | Status: DC | PRN
  Administered 2020-05-24: 100 ug via INTRAVENOUS
  Administered 2020-05-24 (×2): 50 ug via INTRAVENOUS

## 2020-05-24 MED ORDER — THROMBIN 5000 UNITS EX SOLR
CUTANEOUS | Status: AC
  Filled 2020-05-24: qty 10000

## 2020-05-24 MED ORDER — SODIUM CHLORIDE 0.9% FLUSH
3.0000 mL | Freq: Two times a day (BID) | INTRAVENOUS | Status: DC
  Administered 2020-05-24 – 2020-05-28 (×9): 3 mL via INTRAVENOUS

## 2020-05-24 MED ORDER — METHOCARBAMOL 1000 MG/10ML IJ SOLN
500.0000 mg | Freq: Four times a day (QID) | INTRAVENOUS | Status: DC | PRN
  Filled 2020-05-24: qty 5

## 2020-05-24 MED ORDER — HEMOSTATIC AGENTS (NO CHARGE) OPTIME
TOPICAL | Status: DC | PRN
  Administered 2020-05-24 (×2): 1 via TOPICAL

## 2020-05-24 MED ORDER — FUROSEMIDE 40 MG PO TABS
60.0000 mg | ORAL_TABLET | Freq: Every day | ORAL | Status: DC
  Administered 2020-05-24 – 2020-05-29 (×6): 60 mg via ORAL
  Filled 2020-05-24 (×6): qty 1

## 2020-05-24 MED ORDER — ORAL CARE MOUTH RINSE: 15.0000 mL | Freq: Once | OROMUCOSAL | Status: AC

## 2020-05-24 MED ORDER — MENTHOL 3 MG MT LOZG: 1.0000 | LOZENGE | OROMUCOSAL | Status: DC | PRN

## 2020-05-24 MED ORDER — FERROUS SULFATE 325 (65 FE) MG PO TABS
325.0000 mg | ORAL_TABLET | Freq: Every day | ORAL | Status: DC
  Administered 2020-05-25 – 2020-05-29 (×5): 325 mg via ORAL
  Filled 2020-05-24 (×5): qty 1

## 2020-05-24 MED ORDER — ONDANSETRON HCL 4 MG/2ML IJ SOLN
INTRAMUSCULAR | Status: AC
  Filled 2020-05-24: qty 2

## 2020-05-24 MED ORDER — ACETAMINOPHEN 325 MG PO TABS: 650.0000 mg | ORAL_TABLET | ORAL | Status: DC | PRN

## 2020-05-24 MED ORDER — SODIUM CHLORIDE 0.9% FLUSH: 3.0000 mL | INTRAVENOUS | Status: DC | PRN

## 2020-05-24 MED ORDER — CEFAZOLIN SODIUM-DEXTROSE 2-4 GM/100ML-% IV SOLN
2.0000 g | INTRAVENOUS | Status: AC
  Administered 2020-05-24: 2 g via INTRAVENOUS
  Filled 2020-05-24: qty 100

## 2020-05-24 MED ORDER — ROCURONIUM BROMIDE 10 MG/ML (PF) SYRINGE
PREFILLED_SYRINGE | INTRAVENOUS | Status: AC
  Filled 2020-05-24: qty 20

## 2020-05-24 MED ORDER — PHENYLEPHRINE HCL-NACL 20-0.9 MG/250ML-% IV SOLN
INTRAVENOUS | Status: DC | PRN
  Administered 2020-05-24: 50 ug/min via INTRAVENOUS

## 2020-05-24 MED ORDER — 0.9 % SODIUM CHLORIDE (POUR BTL) OPTIME
TOPICAL | Status: DC | PRN
  Administered 2020-05-24: 1000 mL

## 2020-05-24 MED ORDER — FENTANYL CITRATE (PF) 250 MCG/5ML IJ SOLN
INTRAMUSCULAR | Status: AC
  Filled 2020-05-24: qty 5

## 2020-05-24 MED ORDER — OXYCODONE HCL 5 MG PO TABS: 5.0000 mg | ORAL_TABLET | Freq: Once | ORAL | Status: DC | PRN

## 2020-05-24 MED ORDER — CHLORHEXIDINE GLUCONATE 0.12 % MT SOLN
15.0000 mL | Freq: Once | OROMUCOSAL | Status: AC
  Administered 2020-05-24: 15 mL via OROMUCOSAL
  Filled 2020-05-24: qty 15

## 2020-05-24 MED ORDER — THROMBIN 5000 UNITS EX SOLR
CUTANEOUS | Status: DC | PRN
  Administered 2020-05-24 (×2): 5000 [IU] via TOPICAL

## 2020-05-24 MED ORDER — ONDANSETRON HCL 4 MG/2ML IJ SOLN
INTRAMUSCULAR | Status: DC | PRN
  Administered 2020-05-24: 4 mg via INTRAVENOUS

## 2020-05-24 MED ORDER — SERTRALINE HCL 100 MG PO TABS
200.0000 mg | ORAL_TABLET | Freq: Every day | ORAL | Status: DC
  Administered 2020-05-24 – 2020-05-29 (×6): 200 mg via ORAL
  Filled 2020-05-24 (×5): qty 2
  Filled 2020-05-24: qty 4

## 2020-05-24 MED ORDER — PHENYLEPHRINE 40 MCG/ML (10ML) SYRINGE FOR IV PUSH (FOR BLOOD PRESSURE SUPPORT)
PREFILLED_SYRINGE | INTRAVENOUS | Status: AC
  Filled 2020-05-24: qty 20

## 2020-05-24 MED ORDER — FENTANYL CITRATE (PF) 100 MCG/2ML IJ SOLN
25.0000 ug | INTRAMUSCULAR | Status: DC | PRN
  Administered 2020-05-24: 25 ug via INTRAVENOUS

## 2020-05-24 MED ORDER — PROPOFOL 10 MG/ML IV BOLUS
INTRAVENOUS | Status: AC
  Filled 2020-05-24: qty 40

## 2020-05-24 MED ORDER — SENNOSIDES-DOCUSATE SODIUM 8.6-50 MG PO TABS: 1.0000 | ORAL_TABLET | Freq: Every evening | ORAL | Status: DC | PRN

## 2020-05-24 MED ORDER — PROPOFOL 10 MG/ML IV BOLUS
INTRAVENOUS | Status: DC | PRN
  Administered 2020-05-24: 130 mg via INTRAVENOUS

## 2020-05-24 MED ORDER — CEFAZOLIN SODIUM-DEXTROSE 2-4 GM/100ML-% IV SOLN
2.0000 g | Freq: Three times a day (TID) | INTRAVENOUS | Status: AC
  Administered 2020-05-24 – 2020-05-25 (×2): 2 g via INTRAVENOUS
  Filled 2020-05-24 (×2): qty 100

## 2020-05-24 MED ORDER — LIDOCAINE 2% (20 MG/ML) 5 ML SYRINGE
INTRAMUSCULAR | Status: AC
  Filled 2020-05-24: qty 10

## 2020-05-24 SURGICAL SUPPLY — 58 items
BAG BANDED W/RUBBER/TAPE 36X54 (MISCELLANEOUS) IMPLANT
BAND RUBBER #18 3X1/16 STRL (MISCELLANEOUS) ×6 IMPLANT
BASKET BONE COLLECTION (BASKET) IMPLANT
BUR CARBIDE MATCH 3.0 (BURR) ×3 IMPLANT
CARTRIDGE OIL MAESTRO DRILL (MISCELLANEOUS) ×1 IMPLANT
CNTNR URN SCR LID CUP LEK RST (MISCELLANEOUS) ×1 IMPLANT
CONT SPEC 4OZ STRL OR WHT (MISCELLANEOUS) ×3
COVER BACK TABLE 60X90IN (DRAPES) ×3 IMPLANT
COVER MAYO STAND STRL (DRAPES) ×3 IMPLANT
COVER WAND RF STERILE (DRAPES) ×3 IMPLANT
DECANTER SPIKE VIAL GLASS SM (MISCELLANEOUS) ×3 IMPLANT
DERMABOND ADVANCED (GAUZE/BANDAGES/DRESSINGS) ×2
DERMABOND ADVANCED .7 DNX12 (GAUZE/BANDAGES/DRESSINGS) ×1 IMPLANT
DIFFUSER DRILL AIR PNEUMATIC (MISCELLANEOUS) ×3 IMPLANT
DRAPE LAPAROTOMY 100X72X124 (DRAPES) ×3 IMPLANT
DRAPE MICROSCOPE LEICA (MISCELLANEOUS) IMPLANT
DRAPE SURG 17X23 STRL (DRAPES) ×3 IMPLANT
DRSG OPSITE POSTOP 4X6 (GAUZE/BANDAGES/DRESSINGS) ×3 IMPLANT
DRSG PAD ABDOMINAL 8X10 ST (GAUZE/BANDAGES/DRESSINGS) IMPLANT
DURAPREP 26ML APPLICATOR (WOUND CARE) ×3 IMPLANT
ELECT BLADE INSULATED 4IN (ELECTROSURGICAL) ×3
ELECT REM PT RETURN 9FT ADLT (ELECTROSURGICAL) ×3
ELECTRODE BLADE INSULATED 4IN (ELECTROSURGICAL) ×1 IMPLANT
ELECTRODE REM PT RTRN 9FT ADLT (ELECTROSURGICAL) ×1 IMPLANT
GAUZE 4X4 16PLY RFD (DISPOSABLE) IMPLANT
GAUZE SPONGE 4X4 12PLY STRL (GAUZE/BANDAGES/DRESSINGS) ×3 IMPLANT
GLOVE BIOGEL PI IND STRL 8 (GLOVE) ×2 IMPLANT
GLOVE BIOGEL PI INDICATOR 8 (GLOVE) ×4
GLOVE ECLIPSE 8.0 STRL XLNG CF (GLOVE) ×6 IMPLANT
GOWN STRL REUS W/ TWL LRG LVL3 (GOWN DISPOSABLE) ×2 IMPLANT
GOWN STRL REUS W/ TWL XL LVL3 (GOWN DISPOSABLE) ×1 IMPLANT
GOWN STRL REUS W/TWL 2XL LVL3 (GOWN DISPOSABLE) IMPLANT
GOWN STRL REUS W/TWL LRG LVL3 (GOWN DISPOSABLE) ×6
GOWN STRL REUS W/TWL XL LVL3 (GOWN DISPOSABLE) ×3
KIT BASIN OR (CUSTOM PROCEDURE TRAY) ×3 IMPLANT
KIT POSITION SURG JACKSON T1 (MISCELLANEOUS) ×3 IMPLANT
KIT TURNOVER KIT B (KITS) ×3 IMPLANT
MARKER SKIN DUAL TIP RULER LAB (MISCELLANEOUS) ×3 IMPLANT
NEEDLE HYPO 18GX1.5 BLUNT FILL (NEEDLE) ×3 IMPLANT
NEEDLE HYPO 25X1 1.5 SAFETY (NEEDLE) ×3 IMPLANT
NS IRRIG 1000ML POUR BTL (IV SOLUTION) ×3 IMPLANT
OIL CARTRIDGE MAESTRO DRILL (MISCELLANEOUS) ×3
PACK LAMINECTOMY NEURO (CUSTOM PROCEDURE TRAY) ×3 IMPLANT
PAD ARMBOARD 7.5X6 YLW CONV (MISCELLANEOUS) IMPLANT
PATTIES SURGICAL .5 X.5 (GAUZE/BANDAGES/DRESSINGS) IMPLANT
PATTIES SURGICAL .5 X1 (DISPOSABLE) IMPLANT
PATTIES SURGICAL 1X1 (DISPOSABLE) IMPLANT
SPONGE LAP 4X18 RFD (DISPOSABLE) IMPLANT
SPONGE SURGIFOAM ABS GEL SZ50 (HEMOSTASIS) ×6 IMPLANT
STAPLER VISISTAT 35W (STAPLE) ×3 IMPLANT
SUT VIC AB 0 CT1 18XCR BRD8 (SUTURE) ×1 IMPLANT
SUT VIC AB 0 CT1 8-18 (SUTURE) ×3
SUT VIC AB 2-0 CP2 18 (SUTURE) ×6 IMPLANT
SYR 5ML LL (SYRINGE) ×3 IMPLANT
TOWEL GREEN STERILE (TOWEL DISPOSABLE) ×3 IMPLANT
TOWEL GREEN STERILE FF (TOWEL DISPOSABLE) ×3 IMPLANT
TRAY FOLEY MTR SLVR 16FR STAT (SET/KITS/TRAYS/PACK) IMPLANT
WATER STERILE IRR 1000ML POUR (IV SOLUTION) ×3 IMPLANT

## 2020-05-24 NOTE — Anesthesia Procedure Notes (Signed)
Procedure Name: Intubation Date/Time: 05/24/2020 12:16 PM Performed by: Eligha Bridegroom, CRNA Pre-anesthesia Checklist: Patient identified, Emergency Drugs available, Suction available, Patient being monitored and Timeout performed Patient Re-evaluated:Patient Re-evaluated prior to induction Oxygen Delivery Method: Circle system utilized Preoxygenation: Pre-oxygenation with 100% oxygen Induction Type: IV induction Ventilation: Mask ventilation without difficulty Laryngoscope Size: Mac and 3 Grade View: Grade II Tube type: Oral Tube size: 7.5 mm Number of attempts: 1 Airway Equipment and Method: Stylet Placement Confirmation: ETT inserted through vocal cords under direct vision,  positive ETCO2 and breath sounds checked- equal and bilateral Secured at: 22 cm Tube secured with: Tape Dental Injury: Teeth and Oropharynx as per pre-operative assessment

## 2020-05-24 NOTE — Transfer of Care (Signed)
Immediate Anesthesia Transfer of Care Note  Patient: Hunter [name]  Procedure(s) Performed: OPEN LAMINECTOMY LUMBAR THREE-LUMBAR FOUR (N/A )  Patient Location: PACU  Anesthesia Type:General  Level of Consciousness: awake and alert   Airway & Oxygen Therapy: Patient Spontanous Breathing  Post-op Assessment: Report given to RN  Post vital signs: Reviewed and stable  Last Vitals:  Vitals Value Taken Time  BP 194/74 05/24/20 1423  Temp    Pulse 89 05/24/20 1425  Resp 15 05/24/20 1425  SpO2 95 % 05/24/20 1425  Vitals shown include unvalidated device data.  Last Pain:  Vitals:   05/24/20 0935  TempSrc: Oral         Complications: No complications documented.

## 2020-05-24 NOTE — H&P (Signed)
Providing Compassionate, Quality Care - Together  NEUROSURGERY HISTORY & PHYSICAL   Hunter [name] is an 86 y.o. male.   Chief Complaint: Bilateral lower extremity numbness/tingling HPI: This is a pleasant 86 year old male that complains of lower extremity numbness/tingling and burning type pain that worsens as he walks for months.  It has been progressively worsening over the past 6 months.  He states that he has to sit down and take frequent breaks much more frequent than he used to and that relieves his pain.  He also complains of burning in his buttock and into his posterior thighs.  He denies any focal weakness.  He unfortunately does have a challenging social situation but did get medically cleared and cardiac clearance for the surgery.  Social work is already been consulted for ultimate placement and he will likely need some type of physical therapy rehab after surgery.  Past Medical History:  Diagnosis Date  . Allergic rhinitis 02/02/2007  . Anemia   . Arthritis    "knees" (02/08/2017)  . Chronic lower back pain   . CKD (chronic kidney disease), stage III ([place]) 05/27/2011  . Complete heart block 02/08/2017  . Eczema   . Essential hypertension 02/02/2007   Lasix 60 mg, losartan 50mg ,  Amlodipine 5mg --> 2.5 mg.  Usually have to repeat BP measurements  In the past-Maxzide 37.5-25mg .  Marland Kitchen GERD (gastroesophageal reflux disease) occasional  . Hearing loss of both ears   . Heart failure with preserved ejection fraction (HFpEF) 12/06/2013  . History of skin cancer 03/26/2014   nose   . Hx of echocardiogram    Echo (07/2013): Mild LVH, EF 69-62%, grade 1 diastolic dysfunction, mild LAE, PASP 23  . Hyperlipidemia   . Hypertension   . Hypothyroidism   . LBBB (left bundle branch block)   . Left patella fracture 2018   "no OR" (02/08/2017)  . Major depression in partial remission 02/02/2007   Amitriptyline 25mg  for sleep (stop when runs out of #10 04/2018(, zoloft 100-->150mg --> 200mg    . Mild neurocognitive disorder 06/21/2019  . Neuropathy ([place]) 11/02/2011  . Nocturia   . Peripheral vascular disease (HCC) LOWER EXTREMITIES  . Spinal stenosis in cervical region 03/21/2010  . Squamous cell skin cancer, penis: glans ([place]) 05/2010   Initial excision 11/11; recurrence, excision and laser Rx 9/13  . Thrombocytopenia ([place]) 05/23/2012  . Urinary frequency 09/05/2009   Possible BPH- see Shawna Orleans notes   . Vitamin D deficiency 08/15/2008    Past Surgical History:  Procedure Laterality Date  . CIRCUMCISION/ LASER DISSECTION PENILE GLANS CANCER  06-02-2010  . CYSTOSCOPY WITH URETHRAL DILATATION  03/28/2012   Procedure: CYSTOSCOPY WITH URETHRAL DILATATION;  Surgeon: Ailene Rud, MD;  [facility];  Service: Urology;  Laterality: N/A;  excision biopsy extensive meatal penile carcinoma meatoplasty  . EXCISION RIGHT WRIST BENIGN TUMOR  2002 (APPROX)  . hip surgery    . INCISION AND DRAINAGE DEEP NECK ABSCESS    . INGUINAL HERNIA REPAIR  1970's  . KNEE ARTHROSCOPY Right 2017  . PACEMAKER IMPLANT N/A 02/08/2017   Procedure: Pacemaker Implant;  Surgeon: Deboraha Sprang, MD;  Location: [facility];  Service: Cardiovascular;  Laterality: N/A;  . PACEMAKER IMPLANT  02/08/2017   SJM Assurity MRI dual chamber PPM implanted by Dr Caryl Comes for complete heart block  . PENILE BX  05-09-2010  . TONSILLECTOMY    . TRANSURETHRAL RESECTION OF PROSTATE N/A 05/10/2015   Procedure: CYSTOSCOPY, URETHRAL MEATAL  DILATION, TRANSURETHRAL RESECTION OF THE PROSTATE (TURP);  Surgeon: Carolan Clines, MD;  Location: [facility];  Service: Urology;  Laterality: N/A;    Family History  Problem Relation Age of Onset  . Lung cancer Mother   . Arthritis Other        family hx  . Hyperlipidemia Other        family hx  . Hypertension Other        family hx  . Prostate cancer Other        family hx   Social History:  reports that he has never smoked. He has never used smokeless  tobacco. He reports current alcohol use. He reports that he does not use drugs.  Allergies: No Known Allergies  Medications Prior to Admission  Medication Sig Dispense Refill  . amLODipine (NORVASC) 2.5 MG tablet TAKE 1 TABLET BY MOUTH DAILY (Patient taking differently: Take 2.5 mg by mouth daily. ) 90 tablet 0  . aspirin 81 MG chewable tablet Chew 81 mg by mouth daily.     Marland Kitchen atorvastatin (LIPITOR) 20 MG tablet TAKE 1 TABLET BY MOUTH DAILY (Patient taking differently: Take 20 mg by mouth daily. ) 90 tablet 3  . ferrous sulfate 325 (65 FE) MG tablet Take 325 mg by mouth daily with breakfast.    . furosemide (LASIX) 20 MG tablet TAKE THREE TABLETS BY MOUTH DAILY (Patient taking differently: Take 60 mg by mouth daily. ) 270 tablet 0  . levothyroxine (SYNTHROID) 75 MCG tablet TAKE 1 TABLET BY MOUTH EVERY MORNING (Patient taking differently: Take 75 mcg by mouth daily before breakfast. ) 90 tablet 1  . losartan (COZAAR) 50 MG tablet Take 1 tablet (50 mg total) by mouth daily. 90 tablet 1  . PRESCRIPTION MEDICATION 50 mg.    . sertraline (ZOLOFT) 100 MG tablet TAKE TWO TABLETS BY MOUTH EVERY DAY (Patient taking differently: Take 200 mg by mouth daily. ) 180 tablet 0    Results for orders placed or performed during the hospital encounter of 05/24/20 (from the past 48 hour(s))  SARS Coronavirus 2 by RT PCR (hospital order, performed in [facility] hospital lab) Nasopharyngeal Nasopharyngeal Swab     Status: None   Collection Time: 05/24/20  9:29 AM   Specimen: Nasopharyngeal Swab  Result Value Ref Range   SARS Coronavirus 2 NEGATIVE NEGATIVE    Comment: (NOTE) SARS-CoV-2 target nucleic acids are NOT DETECTED.  The SARS-CoV-2 RNA is generally detectable in upper and lower respiratory specimens during the acute phase of infection. The lowest concentration of SARS-CoV-2 viral copies this assay can detect is 250 copies / mL. A negative result does not preclude SARS-CoV-2 infection and should not be  used as the sole basis for treatment or other patient management decisions.  A negative result may occur with improper specimen collection / handling, submission of specimen other than nasopharyngeal swab, presence of viral mutation(s) within the areas targeted by this assay, and inadequate number of viral copies (<250 copies / mL). A negative result must be combined with clinical observations, patient history, and epidemiological information.  Fact Sheet for Patients:   StrictlyIdeas.no  Fact Sheet for Healthcare Providers: BankingDealers.co.za  This test is not yet approved or  cleared by the [place] FDA and has been authorized for detection and/or diagnosis of SARS-CoV-2 by FDA under an Emergency Use Authorization (EUA).  This EUA will remain in effect (meaning this test can be used) for the duration of the COVID-19 declaration under Section 564(b)(1)  of the Act, 21 U.S.C. section 360bbb-3(b)(1), unless the authorization is terminated or revoked sooner.  Performed at [facility], [place] [address] [place], [place] [address]   Protime-INR     Status: None   Collection Time: 05/24/20  9:34 AM  Result Value Ref Range   Prothrombin Time 12.4 11.4 - 15.2 seconds   INR 1.0 0.8 - 1.2    Comment: (NOTE) INR goal varies based on device and disease states. Performed at [facility], [place] [address] [place], [place] [address]   Basic metabolic panel per protocol     Status: Abnormal   Collection Time: 05/24/20  9:34 AM  Result Value Ref Range   Sodium 140 135 - 145 mmol/L   Potassium 4.3 3.5 - 5.1 mmol/L   Chloride 109 98 - 111 mmol/L   CO2 19 (L) 22 - 32 mmol/L   Glucose, Bld 122 (H) 70 - 99 mg/dL    Comment: Glucose reference range applies only to samples taken after fasting for at least 8 hours.   BUN 20 8 - 23 mg/dL   Creatinine, Ser 1.13 0.61 - 1.24 mg/dL   Calcium 9.3 8.9 - 10.3 mg/dL   GFR, Estimated >60 >60  mL/min    Comment: (NOTE) Calculated using the CKD-EPI Creatinine Equation (2021)    Anion gap 12 5 - 15    Comment: Performed at [facility] [address] [place], [place] [address]   No results found.  ROS 14 point review of systems was obtained which all pertinent positives and negatives are listed in HPI above Blood pressure (!) 163/59, pulse 64, temperature 98.5 F (36.9 C), temperature source Oral, resp. rate 17, height 5\' 10"  (1.778 m), weight 99.8 kg, SpO2 96 %. Physical Exam  AOx3 PERRLA Cranial nerves II through XII intact Bilateral upper 5/5 strength Bilateral lower 4/5 strength Deep tendon reflexes decreased Sensory intact light touch Follows commands x4  Assessment/Plan 86 year old male with  1.  Severe stenosis L3-4 with neurogenic claudication  -Or today for open laminectomy L3-4, preoperative imaging shows myelographic block at L3-4 on CT myelogram.  All risks and benefits were discussed and agreed upon with the patient including heart attack, coma, death, need for more surgery, infection, hematoma, spinal fluid leak, permanent or temporary nerve damage.   Thank you for allowing me to participate in this patient's care.  Please do not hesitate to call with questions or concerns.   Elwin Sleight, [facility] Cell: [phone]
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
## 2020-05-24 NOTE — Progress Notes (Signed)
Pt was transferred to 5N15 via wheelchair; in no acute distress nor complaints of discomfort; all belongings  checked and taken along with him; report given to receiving RN Jasmine before arriving on the unit.Marland Kitchen

## 2020-05-24 NOTE — Op Note (Signed)
Date of service: 05/24/2020  PREOP DIAGNOSIS: Severe stenosis, L3-4 with neurogenic claudication  POSTOP DIAGNOSIS: Same  PROCEDURE: 1.  L3, L4 bilateral laminectomy and medial facetectomy, bilateral foraminotomies for decompression of neural elements 2. Use of operating microscope 3. Use of intraoperative fluoroscopy  SURGEON: Dr. Pieter Partridge [name], DO  ASSISTANT: Dr. Ashok Pall, MD  ANESTHESIA: General Endotracheal  EBL: 50 cc  SPECIMENS: None  DRAINS: None  COMPLICATIONS: None  CONDITION: Hemodynamically stable  HISTORY: Hunter [name] is a 86 y.o. male that presented with neurogenic claudication for months that had been progressing making it difficult for him to ambulate normal distances.  He had a CT myelogram due to his pacemaker that ultimately revealed a myelographic block at L3-4 due to severe degenerative stenosis.  His pain had progressively worsened and he was having burning, numbness, tingling in his thighs and buttocks bilaterally.  We discussed conservative management versus surgical intervention in which I recommended surgical intervention as his stenosis was so severe he had a myelographic block.  We discussed the risks and benefits of surgery and he decided to proceed.  He received medical and cardiac clearance for surgery.  PROCEDURE IN DETAIL: After informed consent was obtained and witnessed, the patient was brought to the operating room. After induction of general anesthesia, the patient was positioned on the operative table in the prone position with all pressure points meticulously padded. The skin of the low back was then prepped and draped in the usual sterile fashion.  Under fluoroscopy, the correct level was identified and marked out on the skin, and after timeout was conducted, the skin was infiltrated with local anesthetic. Skin incision was then made sharply and Bovie electrocautery was used to dissect the subcutaneous tissue until the lumbodorsal fascia  was identified. The fashion was then incised using Bovie electrocautery and the lamina at the L3-L4 levels was identified and dissection was carried out in the subperiosteal plane. Self-retaining retractor was then placed, and intraoperative x-ray was taken to confirm we were at the correct level.  The spinous process of L3 and L4 were removed with a Leksell rongeur.  Using a high-speed drill, bilateral laminectomy at L3 up to the insertion of the ligamentum flavum was completed with a partial medial facetectomy bilaterally.  The superior portion of the L4 lamina was removed with a high-speed drill bilaterally until the epidural space was encountered.  The ligamentum flavum was then identified and carefully removed using kerrison ronguers.  The ligamentum flavum was resected going out into the lateral recess and foramina using a series of micro curettes and Kerrison rongeurs.  This was performed bilaterally.  There was significant ossification and hypertrophy of the ligamentum flavum bilaterally.  Using a Stage manager both neuroforamen at L3-4 were felt to be decompressed.  The epidural space was explored with the [facility] ball superiorly bilaterally and inferiorly bilaterally.  If felt to be significantly decompressed.  A mixture of Avitene and Depo-Medrol was mixed and placed in the epidural space after hemostasis was achieved with Gelfoam and thrombin.  The wound was noted to be hemostatic after removal of self-retaining retractors. The wound is closed in layers using a combination of interrupted 0 Vicryl and 2-0 Vicryl stitches. The skin was closed using standard skin glue.  At the end of the case all sponge, needle, and instrument counts were correct. The patient was then transferred to the stretcher and taken to the postanesthesia care unit in stable hemodynamic condition.

## 2020-05-24 NOTE — Anesthesia Preprocedure Evaluation (Addendum)
Anesthesia Evaluation  Patient identified by MRN, date of birth, ID band Patient awake    Reviewed: Allergy & Precautions, NPO status , Patient's Chart, lab work & pertinent test results  History of Anesthesia Complications Negative for: history of anesthetic complications  Airway Mallampati: II  TM Distance: >3 FB Neck ROM: Full    Dental  (+) Dental Advisory Given, Teeth Intact   Pulmonary neg pulmonary ROS,    Pulmonary exam normal        Cardiovascular hypertension, Pt. on medications + Peripheral Vascular Disease  Normal cardiovascular exam+ dysrhythmias + pacemaker    '19 TTE - EF 50%. Septal motionsuggestive of LBBB or RV pacing. Grade 1 diastolic dysfunction. Mild MR. Pacer wire or catheter noted in right ventricle. Trivial TR.  Pacer dependent. Reprogrammed for procedure to prevent interference    Neuro/Psych PSYCHIATRIC DISORDERS Depression  Hearing loss Peripheral neuropathy     GI/Hepatic Neg liver ROS, GERD  Controlled,  Endo/Other  Hypothyroidism  Obesity   Renal/GU CRFRenal disease     Musculoskeletal  (+) Arthritis ,   Abdominal   Peds  Hematology negative hematology ROS (+)   Anesthesia Other Findings Covid test negative    Reproductive/Obstetrics                           Anesthesia Physical Anesthesia Plan  ASA: III  Anesthesia Plan: General   Post-op Pain Management:    Induction: Intravenous  PONV Risk Score and Plan: 3 and Treatment may vary due to age or medical condition and Ondansetron  Airway Management Planned: Oral ETT  Additional Equipment: None  Intra-op Plan:   Post-operative Plan: Extubation in OR  Informed Consent: I have reviewed the patients History and Physical, chart, labs and discussed the procedure including the risks, benefits and alternatives for the proposed anesthesia with the patient or authorized representative who has  indicated his/her understanding and acceptance.     Dental advisory given  Plan Discussed with: CRNA and Anesthesiologist  Anesthesia Plan Comments:        Anesthesia Quick Evaluation

## 2020-05-24 NOTE — Anesthesia Postprocedure Evaluation (Signed)
Anesthesia Post Note  Patient: Hunter [name]  Procedure(s) Performed: OPEN LAMINECTOMY LUMBAR THREE-LUMBAR FOUR (N/A )     Patient location during evaluation: PACU Anesthesia Type: General Level of consciousness: awake and alert Pain management: pain level controlled Vital Signs Assessment: post-procedure vital signs reviewed and stable Respiratory status: spontaneous breathing, nonlabored ventilation and respiratory function stable Cardiovascular status: blood pressure returned to baseline and stable Postop Assessment: no apparent nausea or vomiting Anesthetic complications: no   No complications documented.  Last Vitals:  Vitals:   05/24/20 1530 05/24/20 1545  BP: (!) 144/79 (!) 161/86  Pulse: 91 89  Resp: 20 17  Temp:    SpO2: 100% 100%    Last Pain:  Vitals:   05/24/20 1545  TempSrc:   PainSc: 0-No pain    LLE Motor Response: Purposeful movement (05/24/20 1545) LLE Sensation: Full sensation (05/24/20 1545) RLE Motor Response: Purposeful movement (05/24/20 1545) RLE Sensation: Full sensation (05/24/20 1545)      Audry Pili

## 2020-05-24 NOTE — Progress Notes (Signed)
2240 Patient arrived via wheelchair from [facility] to room 515 with belongings. Patient's incision to lower back covered with honeycomb dsg clean dry and intact. Assessment completed and patient settled in bed. Will continue to monitor and continue current POC.

## 2020-05-24 NOTE — Progress Notes (Signed)
   Providing Compassionate, Quality Care - Together  NEUROSURGERY PROGRESS NOTE   S: s/e in pacu, no complaints  O: EXAM:  BP (!) 182/72 (BP Location: Right Arm)   Pulse 64   Temp 98.1 F (36.7 C) (Oral)   Resp 20   Ht 5\' 10"  (1.778 m)   Wt 99.8 kg   SpO2 98%   BMI 31.57 kg/m   Awake, alert, oriented  Speech fluent, appropriate  CNs grossly intact  Full strength BUE/BLE  Incision c/d/i Hard of hearing  ASSESSMENT:  86 y.o. male with  1. L3-4 severe stenosis w neurogenic claudication  PLAN: - pt/ot -SW on case, may need rehab -pain control     Thank you for allowing me to participate in this patient's care.  Please do not hesitate to call with questions or concerns.   Elwin Sleight, [facility] Cell: [phone]

## 2020-05-25 ENCOUNTER — Encounter (HOSPITAL_COMMUNITY): Payer: Self-pay | Admitting: Neurological Surgery

## 2020-05-25 LAB — CBC
HCT: 37.8 % — ABNORMAL LOW (ref 39.0–52.0)
Hemoglobin: 12.5 g/dL — ABNORMAL LOW (ref 13.0–17.0)
MCH: 27.6 pg (ref 26.0–34.0)
MCHC: 33.1 g/dL (ref 30.0–36.0)
MCV: 83.4 fL (ref 80.0–100.0)
Platelets: 92 10*3/uL — ABNORMAL LOW (ref 150–400)
RBC: 4.53 MIL/uL (ref 4.22–5.81)
RDW: 14.2 % (ref 11.5–15.5)
WBC: 6.7 10*3/uL (ref 4.0–10.5)
nRBC: 0 % (ref 0.0–0.2)

## 2020-05-25 LAB — BASIC METABOLIC PANEL
Anion gap: 11 (ref 5–15)
BUN: 16 mg/dL (ref 8–23)
CO2: 25 mmol/L (ref 22–32)
Calcium: 8.8 mg/dL — ABNORMAL LOW (ref 8.9–10.3)
Chloride: 103 mmol/L (ref 98–111)
Creatinine, Ser: 1.45 mg/dL — ABNORMAL HIGH (ref 0.61–1.24)
GFR, Estimated: 48 mL/min — ABNORMAL LOW (ref >60)
Glucose, Bld: 133 mg/dL — ABNORMAL HIGH (ref 70–99)
Potassium: 4.1 mmol/L (ref 3.5–5.1)
Sodium: 139 mmol/L (ref 135–145)

## 2020-05-25 MED ORDER — ENOXAPARIN SODIUM 40 MG/0.4ML ~~LOC~~ SOLN
40.0000 mg | SUBCUTANEOUS | Status: DC
  Administered 2020-05-26 – 2020-05-29 (×4): 40 mg via SUBCUTANEOUS
  Filled 2020-05-25 (×4): qty 0.4

## 2020-05-25 NOTE — Evaluation (Signed)
Occupational Therapy Evaluation Patient Details Name: Hunter [name] MRN: [id number] DOB: 1933/11/07 Today's Date: 05/25/2020    History of Present Illness Patient is an 86 year old male S/P L3 L4 lumbar lamenectomy. PMH: urinary frequncy, thrombocytopenis, nocturia, bundle branch block, CHF, HTN, anemia, CKD   Clinical Impression   PTA pt living alone, functioning at mod I level for ADL/IADL. Unsure how safe pt was mobilizing prior to sx given current presentation, but endorses no falls. At time of eval, pt is able to complete bed mobility with min A level and sit <> stands at min guard level with RW. Pt then mobilized to bathroom for toilet transfer, requiring cues for safe descent and to maintain precautions. Pt required increased cues for safety and redirection throughout session. He was able to end session up in chair for lunch. Recommend SNF at d/c due to complex social situation and limited support at home. Will continue to follow per POC listed below.    Follow Up Recommendations  SNF    Equipment Recommendations  3 in 1 bedside commode    Recommendations for Other Services       Precautions / Restrictions Precautions Precautions: Back Precaution Booklet Issued: Yes (comment) Precaution Comments: reviewed in context of ADL Restrictions Weight Bearing Restrictions: No Other Position/Activity Restrictions: no brace needed per orders      Mobility Bed Mobility Overal bed mobility: Needs Assistance Bed Mobility: Rolling;Sidelying to Sit Rolling: Min guard Sidelying to sit: Min assist       General bed mobility comments: cues for log rolling technique and to not twist when attempting to get OOB. Assist required to support trunk in attaining upright    Transfers Overall transfer level: Needs assistance Equipment used: Rolling walker (2 wheeled) Transfers: Sit to/from Stand Sit to Stand: Min guard         General transfer comment: close guarding for balance and  safety    Balance Overall balance assessment: Needs assistance Sitting-balance support: No upper extremity supported Sitting balance-Leahy Scale: Good     Standing balance support: Bilateral upper extremity supported;During functional activity Standing balance-Leahy Scale: Poor Standing balance comment: reliant on external support                           ADL either performed or assessed with clinical judgement   ADL Overall ADL's : Needs assistance/impaired Eating/Feeding: Set up;Sitting   Grooming: Set up;Sitting   Upper Body Bathing: Minimal assistance;Sitting   Lower Body Bathing: Moderate assistance;Sitting/lateral leans;Sit to/from stand;Adhering to back precautions;Cueing for back precautions   Upper Body Dressing : Set up;Sitting   Lower Body Dressing: Moderate assistance;Maximal assistance;Sitting/lateral leans;Sit to/from stand;Adhering to back precautions;Cueing for back precautions   Toilet Transfer: Min guard;Ambulation;Regular Toilet;Grab bars;RW   Toileting- Clothing Manipulation and Hygiene: Minimal assistance;Moderate assistance;Cueing for back precautions;Sitting/lateral lean;Sit to/from stand       Functional mobility during ADLs: Min guard;Rolling walker       Vision Patient Visual Report: No change from baseline       Perception     Praxis      Pertinent Vitals/Pain Pain Assessment: Faces Faces Pain Scale: Hurts a little bit Pain Location: Low Back  Pain Descriptors / Indicators: Aching Pain Intervention(s): Limited activity within patient's tolerance;Monitored during session;Repositioned     Hand Dominance Right   Extremity/Trunk Assessment Upper Extremity Assessment Upper Extremity Assessment: Generalized weakness   Lower Extremity Assessment Lower Extremity Assessment: Defer to PT evaluation  Communication Communication Communication: HOH   Cognition Arousal/Alertness: Awake/alert Behavior During Therapy:  WFL for tasks assessed/performed Overall Cognitive Status: No family/caregiver present to determine baseline cognitive functioning Area of Impairment: Attention;Memory                   Current Attention Level: Selective Memory: Decreased short-term memory         General Comments: pt often tangential and continuing to talk about topics that do not relate. Is HOH and hearing aides were charing this session. requires redirection and repetitive cues   General Comments       Exercises     Shoulder Instructions      Home Living Family/patient expects to be discharged to:: Private residence Living Arrangements: Alone   Type of Home: Apartment Home Access: Elevator     Home Layout: One level     Bathroom Shower/Tub: Occupational psychologist: Standard     Home Equipment: Environmental consultant - 2 wheels;Walker - 4 wheels   Additional Comments: Patient lives alone. He reports he has no assistance.       Prior Functioning/Environment Level of Independence: Independent with assistive device(s)        Comments: Patient lived alone. He reports he walked his dog daily. he has 4 different walkers. He reports the back pain had gottent to the point wehre he could hardly walk         OT Problem List: Decreased strength;Decreased knowledge of use of DME or AE;Decreased knowledge of precautions;Decreased activity tolerance;Impaired balance (sitting and/or standing);Pain      OT Treatment/Interventions: Self-care/ADL training;Therapeutic exercise;Patient/family education;Balance training;Therapeutic activities;DME and/or AE instruction    OT Goals(Current goals can be found in the care plan section) Acute Rehab OT Goals Patient Stated Goal: to get home to his dog  OT Goal Formulation: With patient Time For Goal Achievement: 06/08/20 Potential to Achieve Goals: Good  OT Frequency: Min 2X/week   Barriers to D/C:            Co-evaluation              AM-PAC OT "6  Clicks" Daily Activity     Outcome Measure Help from another person eating meals?: A Little Help from another person taking care of personal grooming?: A Little Help from another person toileting, which includes using toliet, bedpan, or urinal?: A Little Help from another person bathing (including washing, rinsing, drying)?: A Lot Help from another person to put on and taking off regular upper body clothing?: A Little Help from another person to put on and taking off regular lower body clothing?: A Lot 6 Click Score: 16   End of Session Equipment Utilized During Treatment: Gait belt;Rolling walker Nurse Communication: Mobility status  Activity Tolerance: Patient tolerated treatment well Patient left: in chair;with call bell/phone within reach;with chair alarm set  OT Visit Diagnosis: Unsteadiness on feet (R26.81);Other abnormalities of gait and mobility (R26.89);Pain Pain - part of body:  (low back)                Time: 1761-6073 OT Time Calculation (min): 27 min Charges:  OT General Charges $OT Visit: 1 Visit OT Evaluation $OT Eval Moderate Complexity: 1 Mod OT Treatments $Self Care/Home Management : 8-22 mins  Zenovia Jarred, MSOT, OTR/L Acute Rehabilitation Services [facility] Office Number: [phone] Pager: [phone]  Zenovia Jarred 05/25/2020, 2:26 PM

## 2020-05-25 NOTE — Progress Notes (Signed)
Subjective: Patient reports mild back pain  Objective: Vital signs in last 24 hours: Temp:  [97.6 F (36.4 C)-98.3 F (36.8 C)] 97.7 F (36.5 C) (11/13 0836) Pulse Rate:  [61-91] 64 (11/13 0836) Resp:  [13-23] 17 (11/13 0836) BP: (125-194)/(56-101) 125/69 (11/13 0836) SpO2:  [95 %-100 %] 98 % (11/13 0836)  Intake/Output from previous day: 11/12 0701 - 11/13 0700 In: 1000 [I.V.:1000] Out: 1430 [Urine:1400; Blood:30] Intake/Output this shift: Total I/O In: 240 [P.O.:240] Out: -   NAD 5/5 strength DF, PF, KE.  Dressing c/d/i 1+ pitting edema LEs No LE tenderness or redness  Lab Results: Recent Labs    05/24/20 0934 05/25/20 0455  WBC 5.7 6.7  HGB 14.2 12.5*  HCT 44.5 37.8*  PLT 96* 92*   BMET Recent Labs    05/24/20 0934 05/25/20 0455  NA 140 139  K 4.3 4.1  CL 109 103  CO2 19* 25  GLUCOSE 122* 133*  BUN 20 16  CREATININE 1.13 1.45*  CALCIUM 9.3 8.8*    Studies/Results: DG Lumbar Spine 2-3 Views  Result Date: 05/24/2020 CLINICAL DATA:  Surgery, elective. Additional history provided: Open laminectomy L3-L4. Provided fluoroscopy time 7 seconds (6.13 mGy). EXAM: LUMBAR SPINE - 2-3 VIEW; DG C-ARM 1-60 MIN COMPARISON:  Lumbar CT myelogram 04/10/2020. FINDINGS: A single lateral view intraoperative fluoroscopic image of the lumbar spine is submitted. The image demonstrates metallic surgical instruments projecting posterior to the L3-L4 disc space and L4 vertebral body. IMPRESSION: Single lateral view intraoperative fluoroscopic image of the lumbar spine. Metallic surgical instruments project posterior to the L3-L4 disc space and L4 vertebral body. Electronically Signed   By: Kellie Simmering DO   On: 05/24/2020 13:28   DG C-Arm 1-60 Min  Result Date: 05/24/2020 CLINICAL DATA:  Surgery, elective. Additional history provided: Open laminectomy L3-L4. Provided fluoroscopy time 7 seconds (6.13 mGy). EXAM: LUMBAR SPINE - 2-3 VIEW; DG C-ARM 1-60 MIN COMPARISON:  Lumbar CT  myelogram 04/10/2020. FINDINGS: A single lateral view intraoperative fluoroscopic image of the lumbar spine is submitted. The image demonstrates metallic surgical instruments projecting posterior to the L3-L4 disc space and L4 vertebral body. IMPRESSION: Single lateral view intraoperative fluoroscopic image of the lumbar spine. Metallic surgical instruments project posterior to the L3-L4 disc space and L4 vertebral body. Electronically Signed   By: Kellie Simmering DO   On: 05/24/2020 13:28    Assessment/Plan: Severe lumbar stenosis s/p posterior decompression POD#1 - likely will need SNF vs rehab - cont Foley, likely void trial Monday   Vallarie Mare 05/25/2020, 10:39 AM

## 2020-05-25 NOTE — Evaluation (Signed)
Physical Therapy Evaluation Patient Details Name: Hunter [name] MRN: [id number] DOB: 04/14/1934 Today's Date: 05/25/2020   History of Present Illness  Patient is an 86 year old male S/P L3 L4 lumbar lamenectomy. PMH: urinary frequncy, thrombocytopenis, nocturia, bundle branch block, CHF, HTN, anemia, CKD  Clinical Impression  Patient required  Min a for bed mobility and min guard for general mobility activity. His gait distance is limited. At his apartment he has to be able to walk about 100' down a hall way just to get to his apartment. He is very motivated. He would benefit from skilled therapy to improve his ability to take care of himself at home. He has limited caregiver support at home.     Follow Up Recommendations SNF    Equipment Recommendations  Rolling walker with 5" wheels    Recommendations for Other Services       Precautions / Restrictions Precautions Precautions: Back;ICD/Pacemaker Precaution Comments: reviewed BLT percuations Restrictions Weight Bearing Restrictions: No      Mobility  Bed Mobility Overal bed mobility: Needs Assistance Bed Mobility: Supine to Sit;Sit to Supine     Supine to sit: Min assist Sit to supine: Min assist   General bed mobility comments: Min a to sit patient up in bed and min a to get legs back into the bed     Transfers Overall transfer level: Needs assistance Equipment used: Rolling walker (2 wheeled) Transfers: Sit to/from Stand Sit to Stand: Min guard         General transfer comment: min gaurd for intial balance   Ambulation/Gait Ambulation/Gait assistance: Min guard Gait Distance (Feet): 25 Feet Assistive device: Rolling walker (2 wheeled) Gait Pattern/deviations: Decreased stride length Gait velocity: decreased  Gait velocity interpretation: <1.31 ft/sec, indicative of household ambulator General Gait Details: slow but steady gait. Patient reported minor pressure in his back walking but no pain. he required  cuing for direction and safety with the walker   Stairs            Wheelchair Mobility    Modified Rankin (Stroke Patients Only)       Balance Overall balance assessment: Needs assistance Sitting-balance support: No upper extremity supported Sitting balance-Leahy Scale: Good     Standing balance support: Bilateral upper extremity supported Standing balance-Leahy Scale: Poor Standing balance comment: used walker for balance                              Pertinent Vitals/Pain Pain Assessment: Faces Faces Pain Scale: Hurts a little bit Pain Location: Low Back  Pain Descriptors / Indicators: Aching Pain Intervention(s): Limited activity within patient's tolerance;Monitored during session;Premedicated before session    Home Living Family/patient expects to be discharged to:: Private residence Living Arrangements: Alone   Type of Home: Apartment Home Access: Elevator     Home Layout: One level Home Equipment: Environmental consultant - 2 wheels;Walker - 4 wheels Additional Comments: Patient lives alone. He reports he has no assistance.     Prior Function Level of Independence: Independent with assistive device(s)         Comments: Patient lived alone. He reports he walked his dog daily. he has 4 different walkers. He reports the back pain had gottent to the point wehre he could hardly walk      Hand Dominance   Dominant Hand: Right    Extremity/Trunk Assessment   Upper Extremity Assessment Upper Extremity Assessment: Defer to OT evaluation  Lower Extremity Assessment Lower Extremity Assessment: Generalized weakness    Cervical / Trunk Assessment Cervical / Trunk Assessment: Normal  Communication   Communication: HOH  Cognition Arousal/Alertness: Awake/alert Behavior During Therapy: WFL for tasks assessed/performed Overall Cognitive Status: Within Functional Limits for tasks assessed                                 General Comments:  patient had hearing aides but did not have them in       General Comments      Exercises     Assessment/Plan    PT Assessment Patient needs continued PT services  PT Problem List Decreased strength;Decreased range of motion;Decreased activity tolerance;Decreased mobility;Decreased safety awareness       PT Treatment Interventions DME instruction;Gait training;Functional mobility training;Therapeutic activities;Therapeutic exercise;Patient/family education;Neuromuscular re-education    PT Goals (Current goals can be found in the Care Plan section)  Acute Rehab PT Goals Patient Stated Goal: to get home to his dog  PT Goal Formulation: With patient Time For Goal Achievement: 06/08/20 Potential to Achieve Goals: Good    Frequency Min 5X/week   Barriers to discharge Decreased caregiver support has no one to help him at home     Co-evaluation               AM-PAC PT "6 Clicks" Mobility  Outcome Measure Help needed turning from your back to your side while in a flat bed without using bedrails?: A Little Help needed moving from lying on your back to sitting on the side of a flat bed without using bedrails?: A Little Help needed moving to and from a bed to a chair (including a wheelchair)?: A Little Help needed standing up from a chair using your arms (e.g., wheelchair or bedside chair)?: A Little Help needed to walk in hospital room?: A Lot Help needed climbing 3-5 steps with a railing? : Total 6 Click Score: 15    End of Session Equipment Utilized During Treatment: Gait belt Activity Tolerance: Patient tolerated treatment well Patient left: with call bell/phone within reach;in bed;with bed alarm set (No chair available ) Nurse Communication: Mobility status PT Visit Diagnosis: Other abnormalities of gait and mobility (R26.89);Unsteadiness on feet (R26.81);Pain Pain - part of body:  (Back )    Time: 9458-5929 PT Time Calculation (min) (ACUTE ONLY): 26  min   Charges:   PT Evaluation $PT Eval Moderate Complexity: 1 Mod            Carney Living PT DPT  05/25/2020, 11:23 AM

## 2020-05-25 NOTE — Plan of Care (Signed)
No acute changes since the previous night that I took care of him. Discharge plan for patient is SNF vs. Rehab. Honeycomb dsg clean dry and intact with no s/sx of infection observed. Will continue to monitor and continue current POC.

## 2020-05-25 NOTE — Plan of Care (Signed)
Pt tolerating movement well and gets up and goes to bathroom with assistance. Pain is controlled and only hurts with movement. Will continue to monitor.  Problem: Safety: Goal: Ability to remain free from injury will improve Outcome: Progressing   Problem: Activity: Goal: Ability to avoid complications of mobility impairment will improve Outcome: Progressing Goal: Ability to tolerate increased activity will improve Outcome: Progressing Goal: Will remain free from falls Outcome: Progressing   Problem: Bowel/Gastric: Goal: Gastrointestinal status for postoperative course will improve Outcome: Progressing   Problem: Pain Management: Goal: Pain level will decrease Outcome: Progressing

## 2020-05-25 NOTE — Care Management Obs Status (Signed)
[place] NOTIFICATION   Patient Details  Name: Hunter [name] MRN: [id number] Date of Birth: 1934-05-01   Medicare Observation Status Notification Given:  Yes    Norina Buzzard, RN 05/25/2020, 5:11 PM

## 2020-05-26 DIAGNOSIS — Z801 Family history of malignant neoplasm of trachea, bronchus and lung: Secondary | ICD-10-CM | POA: Diagnosis not present

## 2020-05-26 DIAGNOSIS — E785 Hyperlipidemia, unspecified: Secondary | ICD-10-CM | POA: Diagnosis present

## 2020-05-26 DIAGNOSIS — Z950 Presence of cardiac pacemaker: Secondary | ICD-10-CM | POA: Diagnosis not present

## 2020-05-26 DIAGNOSIS — I130 Hypertensive heart and chronic kidney disease with heart failure and stage 1 through stage 4 chronic kidney disease, or unspecified chronic kidney disease: Secondary | ICD-10-CM | POA: Diagnosis present

## 2020-05-26 DIAGNOSIS — M48062 Spinal stenosis, lumbar region with neurogenic claudication: Secondary | ICD-10-CM | POA: Diagnosis present

## 2020-05-26 DIAGNOSIS — Z20822 Contact with and (suspected) exposure to covid-19: Secondary | ICD-10-CM | POA: Diagnosis present

## 2020-05-26 DIAGNOSIS — K219 Gastro-esophageal reflux disease without esophagitis: Secondary | ICD-10-CM | POA: Diagnosis present

## 2020-05-26 DIAGNOSIS — Z79890 Hormone replacement therapy (postmenopausal): Secondary | ICD-10-CM | POA: Diagnosis not present

## 2020-05-26 DIAGNOSIS — N183 Chronic kidney disease, stage 3 unspecified: Secondary | ICD-10-CM | POA: Diagnosis present

## 2020-05-26 DIAGNOSIS — G629 Polyneuropathy, unspecified: Secondary | ICD-10-CM | POA: Diagnosis present

## 2020-05-26 DIAGNOSIS — Z7982 Long term (current) use of aspirin: Secondary | ICD-10-CM | POA: Diagnosis not present

## 2020-05-26 DIAGNOSIS — I739 Peripheral vascular disease, unspecified: Secondary | ICD-10-CM | POA: Diagnosis present

## 2020-05-26 DIAGNOSIS — L309 Dermatitis, unspecified: Secondary | ICD-10-CM | POA: Diagnosis present

## 2020-05-26 DIAGNOSIS — Z79899 Other long term (current) drug therapy: Secondary | ICD-10-CM | POA: Diagnosis not present

## 2020-05-26 DIAGNOSIS — Z85828 Personal history of other malignant neoplasm of skin: Secondary | ICD-10-CM | POA: Diagnosis not present

## 2020-05-26 DIAGNOSIS — I442 Atrioventricular block, complete: Secondary | ICD-10-CM | POA: Diagnosis present

## 2020-05-26 DIAGNOSIS — G3184 Mild cognitive impairment, so stated: Secondary | ICD-10-CM | POA: Diagnosis present

## 2020-05-26 DIAGNOSIS — E039 Hypothyroidism, unspecified: Secondary | ICD-10-CM | POA: Diagnosis present

## 2020-05-26 DIAGNOSIS — H9193 Unspecified hearing loss, bilateral: Secondary | ICD-10-CM | POA: Diagnosis present

## 2020-05-26 DIAGNOSIS — I5032 Chronic diastolic (congestive) heart failure: Secondary | ICD-10-CM | POA: Diagnosis present

## 2020-05-26 NOTE — NC FL2 (Signed)
[place] LEVEL OF CARE SCREENING TOOL     IDENTIFICATION  Patient Name: Hunter [name] Birthdate: 1933-09-22 Sex: male Admission Date (Current Location): 05/24/2020  [facility] and [place] Number:      Facility and Address:         Provider Number: [id number]  Attending Physician Name and Address:  Ashok Pall, MD  Relative Name and Phone Number:       Current Level of Care:   Recommended Level of Care: [place] Prior Approval Number:    Date Approved/Denied:   PASRR Number: [id number]  Discharge Plan: Home    Current Diagnoses: Patient Active Problem List   Diagnosis Date Noted  . Lumbar stenosis with neurogenic claudication 05/24/2020  . Mild cognitive impairment 06/21/2019  . Generalized weakness 10/13/2018  . Acute blood loss as cause of postoperative anemia 10/26/2017  . Delirium 10/24/2017  . Hypokalemia 10/24/2017  . Pulmonary edema 10/24/2017  . Cardiac pacemaker 10/22/2017  . Fall 10/21/2017  . Fracture of unspecified part of neck of left femur, initial encounter for closed fracture ([place]) 10/21/2017  . Complete heart block ([place]) 02/08/2017  . BPH (benign prostatic hyperplasia) 05/10/2015  . Skin lesion of left lower extremity 03/26/2014  . Stasis eczema 03/26/2014  . History of skin cancer 03/26/2014  . Heart failure with preserved ejection fraction (HFpEF)  12/06/2013  . Syncope 08/14/2013  . 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 Thrombocytopenia ([place]) 05/23/2012  . Neuropathy ([place]) 11/02/2011  . CKD (chronic kidney disease) 05/27/2011  . Low back pain 04/08/2011  . LBBB (left bundle branch block) 05/12/2010  . Abnormality of gait 04/23/2010  . Spinal stenosis in cervical region 03/21/2010  . Constipation 03/19/2010  . Urinary frequency 09/05/2009  . Hypothyroidism 08/15/2008  . Vitamin D deficiency 08/15/2008  . Anemia 08/15/2008  . Venous stasis 08/15/2008  . Hyperlipidemia 02/02/2007   . Major depression in full remission ([place]) 02/02/2007  . HTN (hypertension) 02/02/2007  . Allergic rhinitis 02/02/2007  . GERD (gastroesophageal reflux disease) 02/02/2007    Orientation RESPIRATION BLADDER Height & Weight     Self, Time, Situation, Place  Normal Continent Weight: 220 lb 0.3 oz (99.8 kg) Height:  5\' 10"  (177.8 cm)  BEHAVIORAL SYMPTOMS/MOOD NEUROLOGICAL BOWEL NUTRITION STATUS      Continent Diet (See discharge summary)  AMBULATORY STATUS COMMUNICATION OF NEEDS Skin   Extensive Assist Verbally Normal, Surgical wounds                       Personal Care Assistance Level of Assistance  Bathing, Feeding, Dressing Bathing Assistance: Maximum assistance Feeding assistance: Independent Dressing Assistance: Maximum assistance     Functional Limitations Info  Speech, Hearing, Sight Sight Info: Adequate Hearing Info: Impaired (Has hearing aids) Speech Info: Adequate    SPECIAL CARE FACTORS FREQUENCY  PT (By licensed PT), OT (By licensed OT)     PT Frequency: 5x a week OT Frequency: 5x a week            Contractures Contractures Info: Not present    Additional Factors Info  Code Status, Allergies Code Status Info: Full Allergies Info: NKA           Current Medications (05/26/2020):  This is the current hospital active medication list Current Facility-Administered Medications  Medication Dose Route Frequency Provider Last Rate Last Admin  . 0.9 %  sodium chloride infusion   Intravenous Continuous Dawley, Troy C, DO      .  acetaminophen (TYLENOL) tablet 650 mg  650 mg Oral Q4H PRN Dawley, Troy C, DO       Or  . acetaminophen (TYLENOL) suppository 650 mg  650 mg Rectal Q4H PRN Dawley, Troy C, DO      . amLODipine (NORVASC) tablet 2.5 mg  2.5 mg Oral Daily Dawley, Troy C, DO   2.5 mg at 05/26/20 0917  . atorvastatin (LIPITOR) tablet 20 mg  20 mg Oral Daily Dawley, Troy C, DO   20 mg at 05/26/20 0917  . Chlorhexidine Gluconate Cloth 2 % PADS 6 each   6 each Topical Once Dawley, Troy C, DO       And  . Chlorhexidine Gluconate Cloth 2 % PADS 6 each  6 each Topical Once Dawley, Troy C, DO      . enoxaparin (LOVENOX) injection 40 mg  40 mg Subcutaneous Q24H Vallarie Mare, MD   40 mg at 05/26/20 0917  . ferrous sulfate tablet 325 mg  325 mg Oral Q breakfast Dawley, Troy C, DO   325 mg at 05/26/20 0917  . furosemide (LASIX) tablet 60 mg  60 mg Oral Daily Dawley, Troy C, DO   60 mg at 05/26/20 0917  . HYDROcodone-acetaminophen (NORCO/VICODIN) 5-325 MG per tablet 1 tablet  1 tablet Oral Q4H PRN Dawley, Troy C, DO   1 tablet at 05/25/20 0929  . lactated ringers infusion   Intravenous Continuous Dawley, Troy C, DO      . levothyroxine (SYNTHROID) tablet 75 mcg  75 mcg Oral q morning - 10a Dawley, Troy C, DO   75 mcg at 05/26/20 0649  . losartan (COZAAR) tablet 50 mg  50 mg Oral Daily Dawley, Troy C, DO   50 mg at 05/26/20 0917  . menthol-cetylpyridinium (CEPACOL) lozenge 3 mg  1 lozenge Oral PRN Dawley, Troy C, DO       Or  . phenol (CHLORASEPTIC) mouth spray 1 spray  1 spray Mouth/Throat PRN Dawley, Troy C, DO      . methocarbamol (ROBAXIN) tablet 500 mg  500 mg Oral Q6H PRN Dawley, Troy C, DO   500 mg at 05/24/20 1805   Or  . methocarbamol (ROBAXIN) 500 mg in dextrose 5 % 50 mL IVPB  500 mg Intravenous Q6H PRN Dawley, Troy C, DO      . morphine 2 MG/ML injection 2 mg  2 mg Intravenous Q2H PRN Dawley, Troy C, DO      . ondansetron (ZOFRAN) tablet 4 mg  4 mg Oral Q6H PRN Dawley, Troy C, DO       Or  . ondansetron (ZOFRAN) injection 4 mg  4 mg Intravenous Q6H PRN Dawley, Troy C, DO      . senna-docusate (Senokot-S) tablet 1 tablet  1 tablet Oral QHS PRN Dawley, Troy C, DO      . sertraline (ZOLOFT) tablet 200 mg  200 mg Oral Daily Dawley, Troy C, DO   200 mg at 05/26/20 0917  . sodium chloride flush (NS) 0.9 % injection 3 mL  3 mL Intravenous Q12H Dawley, Troy C, DO   3 mL at 05/26/20 0920  . sodium chloride flush (NS) 0.9 % injection 3 mL  3 mL  Intravenous PRN Dawley, Troy C, DO         Discharge Medications: Please see discharge summary for a list of discharge medications.  Relevant Imaging Results:  Relevant Lab Results:   Additional Information SSN [id number]  Emeterio Reeve, [place]

## 2020-05-26 NOTE — Plan of Care (Signed)
  Problem: Safety: Goal: Ability to remain free from injury will improve Outcome: Progressing   Problem: Activity: Goal: Ability to avoid complications of mobility impairment will improve Outcome: Progressing Goal: Ability to tolerate increased activity will improve Outcome: Progressing Goal: Will remain free from falls Outcome: Progressing   Problem: Pain Management: Goal: Pain level will decrease Outcome: Progressing   Problem: Skin Integrity: Goal: Will show signs of wound healing Outcome: Progressing

## 2020-05-26 NOTE — Progress Notes (Signed)
Patient ID: Hunter [name], male   DOB: [birth year], 86 y.o.   MRN: [id number] BP (!) 148/66 (BP Location: Left Arm)   Pulse 63   Temp (!) 97.4 F (36.3 C) (Oral)   Resp 18   Ht 5\' 10"  (1.778 m)   Wt 99.8 kg   SpO2 97%   BMI 31.57 kg/m  Alert and oriented x 4 Decreased hearing bilaterally Moving all extremities well Wound is cleAn, dry, no signs of infection Doing well

## 2020-05-26 NOTE — TOC Progression Note (Signed)
Transition of Care [facility]) - Progression Note    Patient Details  Name: Hunter [name] MRN: [id number] Date of Birth: Jul 11, 1934  Transition of Care [facility] CM/SW [place], [place] Phone Number: 05/26/2020, 1:55 PM  Clinical Narrative:     CSW spoke to pt bedside about snf reccomedation. Pt is receptive to SNF although he has had bad experiences in the past. CSW faxed out to facilities in the area.   Expected Discharge Plan: Home/Self Care Barriers to Discharge: Continued Medical Work up  Expected Discharge Plan and Services Expected Discharge Plan: Home/Self Care       Living arrangements for the past 2 months: Apartment                                       Social Determinants of Health (SDOH) Interventions    Readmission Risk Interventions No flowsheet data found.   Emeterio Reeve, Latanya Presser, [place] Social Worker [phone]

## 2020-05-27 MED ORDER — FLEET ENEMA 7-19 GM/118ML RE ENEM
1.0000 | ENEMA | RECTAL | Status: DC | PRN
  Administered 2020-05-27: 1 via RECTAL
  Filled 2020-05-27: qty 1

## 2020-05-27 MED ORDER — MAGNESIUM CITRATE PO SOLN
1.0000 | Freq: Once | ORAL | Status: AC
  Administered 2020-05-27: 1 via ORAL
  Filled 2020-05-27: qty 296

## 2020-05-27 MED ORDER — BISACODYL 10 MG RE SUPP
10.0000 mg | Freq: Once | RECTAL | Status: AC
  Administered 2020-05-27: 10 mg via RECTAL
  Filled 2020-05-27: qty 1

## 2020-05-27 NOTE — Progress Notes (Addendum)
Pt got up to the bathroom and bedside commode several times today. Mg citrate was given for constipation but had a lot of cramping, no bowel movement. Dr Dawley notified. Dulcolax suppository given, pt had a small hard BM. Attempted to do Fleets enema but pt has hard stools, Pt refused to do it further. He said he will try getting up and do it in the bathroom. Pt is exhausted at this time.

## 2020-05-27 NOTE — TOC Progression Note (Signed)
Transition of Care [facility]) - Progression Note    Patient Details  Name: Hunter [name] MRN: [id number] Date of Birth: 02/20/1934  Transition of Care [facility]) CM/SW [place], [place] Phone Number: 05/27/2020, 3:51 PM  Clinical Narrative:     CSW spoke to pt about bed choices. Pt chose [facility] since it is close to his home. CSW confirmed that they will be able to accept him at discharge.   CSW began insurance auth.  Expected Discharge Plan: Home/Self Care Barriers to Discharge: Continued Medical Work up  Expected Discharge Plan and Services Expected Discharge Plan: Home/Self Care       Living arrangements for the past 2 months: [place], [place], [place] Social Worker [phone]                  Social Determinants of Health (SDOH) Interventions    Readmission Risk Interventions No flowsheet data found.

## 2020-05-27 NOTE — Progress Notes (Signed)
Physical Therapy Treatment Patient Details Name: Hunter [name] MRN: [id number] DOB: 1933/07/21 Today's Date: 05/27/2020    History of Present Illness Patient is an 85 year old male S/P L3 L4 lumbar lamenectomy. PMH: urinary frequncy, thrombocytopenis, nocturia, bundle branch block, CHF, HTN, anemia, CKD    PT Comments    Continuing work on functional mobility and activity tolerance;  Pt found sacral sitting semi-sideways in recliner; He was polite, but consistently declined ambulation, insisting that being in the chair and shifting L and R would get his bowels moving; I attempted to compassionately share with him the research that walking is helpful in facilitating a BM, however he continued to refuse; finally he agreed to repositioning in the chair; will continue attempts at progressive ambulation   Follow Up Recommendations  SNF     Equipment Recommendations  Rolling walker with 5" wheels    Recommendations for Other Services       Precautions / Restrictions Precautions Precautions: Back Restrictions Other Position/Activity Restrictions: no brace needed per orders    Mobility  Bed Mobility               General bed mobility comments: received in chair  Transfers Overall transfer level: Needs assistance Equipment used: Rolling walker (2 wheeled) Transfers: Sit to/from Stand Sit to Stand: Min guard         General transfer comment: min guard for safety, cues for hand placement intermittently; only agreed to briefly getting up for repositioning and placement of a pillow/cushion for the recliner seat  Ambulation/Gait             General Gait Details: Refused despite education re: benefits of walking to help with moving bowels   Stairs             Wheelchair Mobility    Modified Rankin (Stroke Patients Only)       Balance                                            Cognition Arousal/Alertness: Awake/alert Behavior During  Therapy: WFL for tasks assessed/performed;Restless Overall Cognitive Status: No family/caregiver present to determine baseline cognitive functioning                                 General Comments: tangential conversation, unable to recall back precautions; difficulty putting together that walking would actually help him have a BM      Exercises      General Comments        Pertinent Vitals/Pain Pain Assessment: Faces Faces Pain Scale: Hurts little more Pain Location: Discomfort related to the need to move his bowels Pain Descriptors / Indicators: Aching;Grimacing Pain Intervention(s): Monitored during session    Home Living                      Prior Function            PT Goals (current goals can now be found in the care plan section) Acute Rehab PT Goals Patient Stated Goal: to get home to his dog  PT Goal Formulation: With patient Time For Goal Achievement: 06/08/20 Potential to Achieve Goals: Good Progress towards PT goals: Not progressing toward goals - comment (limited by need to move bowels)    Frequency    Min  3X/week      PT Plan Current plan remains appropriate;Frequency needs to be updated    Co-evaluation              AM-PAC PT "6 Clicks" Mobility   Outcome Measure  Help needed turning from your back to your side while in a flat bed without using bedrails?: A Little Help needed moving from lying on your back to sitting on the side of a flat bed without using bedrails?: A Little Help needed moving to and from a bed to a chair (including a wheelchair)?: A Little Help needed standing up from a chair using your arms (e.g., wheelchair or bedside chair)?: A Little Help needed to walk in hospital room?: A Little Help needed climbing 3-5 steps with a railing? : A Lot 6 Click Score: 17    End of Session   Activity Tolerance: Other (comment) (Declined walking) Patient left: in chair;with call bell/phone within reach;with  chair alarm set Nurse Communication: Mobility status PT Visit Diagnosis: Other abnormalities of gait and mobility (R26.89);Unsteadiness on feet (R26.81);Pain Pain - part of body:  (Back)     Time: 6578-4696 PT Time Calculation (min) (ACUTE ONLY): 20 min  Charges:  $Therapeutic Activity: 8-22 mins                     Roney Marion, PT  Acute Rehabilitation Services Pager [phone] Office [place] 05/27/2020, 6:29 PM

## 2020-05-27 NOTE — Progress Notes (Signed)
Occupational Therapy Treatment Patient Details Name: Hunter [name] MRN: [id number] DOB: April 22, 1934 Today's Date: 05/27/2020    History of present illness Patient is an 86 year old male S/P L3 L4 lumbar lamenectomy. PMH: urinary frequncy, thrombocytopenis, nocturia, bundle branch block, CHF, HTN, anemia, CKD   OT comments  Pt progressing towards OT goals, able to progress to Supervision for short distance mobility with intermittent cues for safe sequencing of AD. Pt with decreased recall of spinal precautions but able to implement techniques when cued. Plan to address LB ADLs, using AE as needed, during next session. SNF for ST rehab remains a beneficial option to ensure optimal healing and safety.    Follow Up Recommendations  SNF    Equipment Recommendations  3 in 1 bedside commode    Recommendations for Other Services      Precautions / Restrictions Precautions Precautions: Back Precaution Booklet Issued: Yes (comment) Precaution Comments: reviewed in context of ADL Restrictions Weight Bearing Restrictions: No       Mobility Bed Mobility               General bed mobility comments: received in chair  Transfers Overall transfer level: Needs assistance Equipment used: Rolling walker (2 wheeled) Transfers: Sit to/from Stand;Stand Pivot Transfers Sit to Stand: Min guard Stand pivot transfers: Min guard       General transfer comment: min guard for safety, cues for hand placement intermittently     Balance Overall balance assessment: Needs assistance Sitting-balance support: No upper extremity supported Sitting balance-Leahy Scale: Good     Standing balance support: Bilateral upper extremity supported;During functional activity Standing balance-Leahy Scale: Fair Standing balance comment: able to stand unsupported to blow nose, use of AD for dynamic tasks                           ADL either performed or assessed with clinical judgement   ADL  Overall ADL's : Needs assistance/impaired     Grooming: Supervision/safety;Standing;Oral care Grooming Details (indicate cue type and reason): Supervision for safety with minor cues for back precautions, to use cup when rinsing                  Toilet Transfer: Min guard;Ambulation;Regular Toilet;Grab bars;RW Armed forces technical officer Details (indicate cue type and reason): min guard for mobility, cues for RW negotiation. no LOB noted          Functional mobility during ADLs: Min guard;Rolling walker;Cueing for safety;Cueing for sequencing General ADL Comments: Limited by decreased recall of implementing precautions into ADLs      Vision   Vision Assessment?: No apparent visual deficits   Perception     Praxis      Cognition Arousal/Alertness: Awake/alert Behavior During Therapy: WFL for tasks assessed/performed Overall Cognitive Status: No family/caregiver present to determine baseline cognitive functioning Area of Impairment: Attention;Memory                   Current Attention Level: Selective Memory: Decreased short-term memory         General Comments: tangential conversation, unable to recall back precautions        Exercises     Shoulder Instructions       General Comments Reinforced back precautions, pt able to demo use of reacher to pick up ADL items from floor. Pt reports he has a reacher at home that he uses     Pertinent Vitals/ Pain  Pain Assessment: Faces Faces Pain Scale: No hurt Pain Intervention(s): Monitored during session  Home Living                                          Prior Functioning/Environment              Frequency  Min 2X/week        Progress Toward Goals  OT Goals(current goals can now be found in the care plan section)  Progress towards OT goals: Progressing toward goals  Acute Rehab OT Goals Patient Stated Goal: to get home to his dog  OT Goal Formulation: With patient Time For  Goal Achievement: 06/08/20 Potential to Achieve Goals: Good ADL Goals Pt Will Perform Lower Body Bathing: with min assist;sitting/lateral leans;sit to/from stand;with adaptive equipment Pt Will Perform Lower Body Dressing: with min assist;sitting/lateral leans;sit to/from stand;with adaptive equipment Pt Will Transfer to Toilet: with modified independence;ambulating;regular height toilet;grab bars Pt Will Perform Tub/Shower Transfer: with min guard assist;ambulating;shower seat;rolling walker Additional ADL Goal #1: Pt will recall and apply all spinal precautions to ADL activity with min VCs Additional ADL Goal #2: Pt will verbalize at least 3 fall prevention strategies within ADL environment  Plan Discharge plan remains appropriate    Co-evaluation                 AM-PAC OT "6 Clicks" Daily Activity     Outcome Measure   Help from another person eating meals?: None Help from another person taking care of personal grooming?: A Little Help from another person toileting, which includes using toliet, bedpan, or urinal?: A Little Help from another person bathing (including washing, rinsing, drying)?: A Lot Help from another person to put on and taking off regular upper body clothing?: A Little Help from another person to put on and taking off regular lower body clothing?: A Lot 6 Click Score: 17    End of Session Equipment Utilized During Treatment: Gait belt;Rolling walker  OT Visit Diagnosis: Unsteadiness on feet (R26.81);Other abnormalities of gait and mobility (R26.89);Pain Pain - part of body:  (back)   Activity Tolerance Patient tolerated treatment well   Patient Left in chair;with call bell/phone within reach;with chair alarm set   Nurse Communication Mobility status        Time: [phone] OT Time Calculation (min): 47 min  Charges: OT General Charges $OT Visit: 1 Visit OT Treatments $Self Care/Home Management : 23-37 mins $Therapeutic Activity: 8-22  mins  Layla Maw, OTR/L   Layla Maw 05/27/2020, 9:54 AM

## 2020-05-27 NOTE — Progress Notes (Signed)
   Providing Compassionate, Quality Care - Together  NEUROSURGERY PROGRESS NOTE   S: No issues overnight. Sign improvement in LE symptoms, able to ambulate better  O: EXAM:  BP 122/65 (BP Location: Left Arm)   Pulse 76   Temp 98.2 F (36.8 C) (Axillary)   Resp 18   Ht 5\' 10"  (1.778 m)   Wt 99.8 kg   SpO2 98%   BMI 31.57 kg/m   Awake, alert, oriented  Speech fluent, appropriate  CNs grossly intact  5/5 BUE/BLE  Incision c/d/i  ASSESSMENT:  86 y.o. male with  1. L3-4 severe stenosis w neurogenic claudication  S/p L3-4 open lami on 05/24/2020  PLAN: - pt/ot -SW on case, SNF placement pending -pain control    Thank you for allowing me to participate in this patient's care.  Please do not hesitate to call with questions or concerns.   Elwin Sleight, [facility] Cell: [phone]

## 2020-05-27 NOTE — Plan of Care (Signed)
  Problem: Safety: Goal: Ability to remain free from injury will improve Outcome: Progressing   Problem: Education: Goal: Ability to verbalize activity precautions or restrictions will improve Outcome: Progressing   Problem: Activity: Goal: Ability to avoid complications of mobility impairment will improve Outcome: Progressing   Problem: Clinical Measurements: Goal: Ability to maintain clinical measurements within normal limits will improve Outcome: Progressing   Problem: Pain Management: Goal: Pain level will decrease Outcome: Progressing   Problem: Skin Integrity: Goal: Will show signs of wound healing Outcome: Progressing

## 2020-05-28 MED ORDER — GLYCERIN (LAXATIVE) 2.1 G RE SUPP
1.0000 | Freq: Every day | RECTAL | Status: DC | PRN
  Filled 2020-05-28: qty 1

## 2020-05-28 NOTE — Progress Notes (Signed)
   Providing Compassionate, Quality Care - Together  NEUROSURGERY PROGRESS NOTE   S: No issues overnight. +BM  O: EXAM:  BP (!) 112/56 (BP Location: Right Arm)   Pulse 70   Temp 98.5 F (36.9 C) (Oral)   Resp 18   Ht 5\' 10"  (1.778 m)   Wt 99.8 kg   SpO2 97%   BMI 31.57 kg/m   Awake, alert, oriented  Speech fluent, appropriate  CNs grossly intact  5/5 BUE/BLE  Incision c/d/i  ASSESSMENT:  86 y.o. male with  1. L3-4 severe stenosis w neurogenic claudication  S/p L3-4 open lami on 05/24/2020  PLAN: -pt/ot -SW on case, SNF placement pending -pain controlled -doing well   Thank you for allowing me to participate in this patient's care.  Please do not hesitate to call with questions or concerns.   Elwin Sleight, [facility] Cell: [phone]

## 2020-05-28 NOTE — Consult Note (Addendum)
   [facility] Inpatient Consult   05/28/2020  Hunter [name] [birth year] [id number]  [facility] [ACO] Patient:  Hunter [name]  Patient is currently active with [facility] for chronic disease management services.  Patient has been engaged by a [place] and [facility] Education officer, museum.  Our community based plan of care has focused on disease management and community resource support.  Patient here for elective surgery.  Plan:  Follow for progress and disposition needs. Will make Inpatient Transition Of Care [TOC] team member aware that [facility] following. Currently, for a SNF level of care.  Of note, [facility] Care Management services does not replace or interfere with any services that are needed or arranged by inpatient [facility] care management team.  For additional questions or referrals please contact:  Natividad Brood, RN BSN [facility] Liaison  [phone] business mobile phone Toll free office [phone]  Fax number: [phone] [email or web address]

## 2020-05-28 NOTE — Plan of Care (Signed)
  Problem: Safety: Goal: Ability to remain free from injury will improve Outcome: Progressing   Problem: Activity: Goal: Ability to avoid complications of mobility impairment will improve Outcome: Progressing   Problem: Clinical Measurements: Goal: Ability to maintain clinical measurements within normal limits will improve Outcome: Progressing   Problem: Skin Integrity: Goal: Will show signs of wound healing Outcome: Progressing

## 2020-05-28 NOTE — Progress Notes (Signed)
Physical Therapy Treatment Patient Details Name: Hunter [name] MRN: [id number] DOB: 15-May-1934 Today's Date: 05/28/2020    History of Present Illness Patient is an 86 year old male S/P L3 L4 lumbar lamenectomy. PMH: urinary frequncy, thrombocytopenis, nocturia, bundle branch block, CHF, HTN, anemia, CKD    PT Comments    Continuing work on functional mobility and activity tolerance;  Pt reports feeling much better today, able to move his bowels overnight; Session focused on more walking and mobility; Noted loss of balance upon initial stand, requiring mod assist to control descent to chair; Lots of gas upon initial stand as well, and we opted to get to the bathroom first, then hallway walking; Very distractible, almost perseverative re: the need to take care of some bills; Pt's decr hearing (and suboptimal function of his hearing aides) lead to needing to repeat a lot, and correct with multimodal cueing (perhaps we should consider bringing clear mask for use with him)  Follow Up Recommendations  SNF     Equipment Recommendations  Rolling walker with 5" wheels    Recommendations for Other Services       Precautions / Restrictions Precautions Precautions: Back Restrictions Other Position/Activity Restrictions: no brace needed per orders    Mobility  Bed Mobility                  Transfers Overall transfer level: Needs assistance Equipment used: Rolling walker (2 wheeled) Transfers: Sit to/from Stand Sit to Stand: Mod assist         General transfer comment: Posterior loss of balance at initial stand, needing mod assist to control descent to sit back into recliner; min assist to rise after that; then min assist to sit to and stand from toilet, cues for grab bar use  Ambulation/Gait Ambulation/Gait assistance: Min guard Gait Distance (Feet): 100 Feet Assistive device: Rolling walker (2 wheeled) Gait Pattern/deviations: Decreased stride length     General Gait  Details: Cueing for direction and RW proximity, as well as to self-monitor for activity tolerance   Stairs             Wheelchair Mobility    Modified Rankin (Stroke Patients Only)       Balance     Sitting balance-Leahy Scale: Good       Standing balance-Leahy Scale: Fair                              Cognition Arousal/Alertness: Awake/alert Behavior During Therapy: WFL for tasks assessed/performed;Restless Overall Cognitive Status: No family/caregiver present to determine baseline cognitive functioning                                 General Comments: tangential conversation, unable to recall back precautions; Near perseverative re: the need to pay bills      Exercises      General Comments General comments (skin integrity, edema, etc.): Reinforced back precautions      Pertinent Vitals/Pain Pain Assessment: Faces Faces Pain Scale: Hurts a little bit Pain Location: back Pain Descriptors / Indicators: Aching Pain Intervention(s): Monitored during session    Home Living                      Prior Function            PT Goals (current goals can now be found in the care  plan section) Acute Rehab PT Goals Patient Stated Goal: To get some things settled related to bills PT Goal Formulation: With patient Time For Goal Achievement: 06/08/20 Potential to Achieve Goals: Good Progress towards PT goals: Progressing toward goals    Frequency    Min 3X/week      PT Plan Current plan remains appropriate    Co-evaluation              AM-PAC PT "6 Clicks" Mobility   Outcome Measure  Help needed turning from your back to your side while in a flat bed without using bedrails?: A Little Help needed moving from lying on your back to sitting on the side of a flat bed without using bedrails?: A Little Help needed moving to and from a bed to a chair (including a wheelchair)?: A Little Help needed standing up from a  chair using your arms (e.g., wheelchair or bedside chair)?: A Little Help needed to walk in hospital room?: A Little Help needed climbing 3-5 steps with a railing? : A Lot 6 Click Score: 17    End of Session Equipment Utilized During Treatment: Gait belt Activity Tolerance: Patient tolerated treatment well Patient left: in chair;with call bell/phone within reach;with chair alarm set Nurse Communication: Mobility status PT Visit Diagnosis: Other abnormalities of gait and mobility (R26.89);Unsteadiness on feet (R26.81);Pain Pain - part of body:  (back)     Time: 1054 (minus approx 10 minutes while pt was on toilet)-1140 PT Time Calculation (min) (ACUTE ONLY): 46 min  Charges:  $Gait Training: 8-22 mins $Therapeutic Activity: 8-22 mins                     Roney Marion, [place] Pager [phone] Office [place] 05/28/2020, 12:16 PM

## 2020-05-28 NOTE — TOC Progression Note (Signed)
Transition of Care [facility]) - Progression Note    Patient Details  Name: Hunter [name] MRN: [id number] Date of Birth: 16-Jan-1934  Transition of Care [facility]) CM/SW [place], [place] Phone Number: 05/28/2020, 3:02 PM  Clinical Narrative:     Everlene Balls insurance [place] approved, no number given  Approved for 11/17-11/19  care coordinator Artist Dagout.   CSW confirmed with [facility] and they can receive pt when ready. CSW requested new covid test from MD.    Expected Discharge Plan: Home/Self Care Barriers to Discharge: Continued Medical Work up  Expected Discharge Plan and Services Expected Discharge Plan: Home/Self Care       Living arrangements for the past 2 months: Apartment                                       Social Determinants of Health (SDOH) Interventions    Readmission Risk Interventions No flowsheet data found.  Emeterio Reeve, Latanya Presser, [place] Social Worker [phone]

## 2020-05-29 DIAGNOSIS — I10 Essential (primary) hypertension: Secondary | ICD-10-CM | POA: Diagnosis not present

## 2020-05-29 DIAGNOSIS — M1380 Other specified arthritis, unspecified site: Secondary | ICD-10-CM | POA: Diagnosis not present

## 2020-05-29 DIAGNOSIS — M5459 Other low back pain: Secondary | ICD-10-CM | POA: Diagnosis not present

## 2020-05-29 DIAGNOSIS — R6889 Other general symptoms and signs: Secondary | ICD-10-CM | POA: Diagnosis not present

## 2020-05-29 DIAGNOSIS — I739 Peripheral vascular disease, unspecified: Secondary | ICD-10-CM | POA: Diagnosis not present

## 2020-05-29 DIAGNOSIS — M2550 Pain in unspecified joint: Secondary | ICD-10-CM | POA: Diagnosis not present

## 2020-05-29 DIAGNOSIS — D649 Anemia, unspecified: Secondary | ICD-10-CM | POA: Diagnosis not present

## 2020-05-29 DIAGNOSIS — Z7401 Bed confinement status: Secondary | ICD-10-CM | POA: Diagnosis not present

## 2020-05-29 DIAGNOSIS — E039 Hypothyroidism, unspecified: Secondary | ICD-10-CM | POA: Diagnosis not present

## 2020-05-29 DIAGNOSIS — R262 Difficulty in walking, not elsewhere classified: Secondary | ICD-10-CM | POA: Diagnosis not present

## 2020-05-29 DIAGNOSIS — Z9889 Other specified postprocedural states: Secondary | ICD-10-CM | POA: Diagnosis not present

## 2020-05-29 DIAGNOSIS — M48062 Spinal stenosis, lumbar region with neurogenic claudication: Secondary | ICD-10-CM | POA: Diagnosis not present

## 2020-05-29 DIAGNOSIS — Z743 Need for continuous supervision: Secondary | ICD-10-CM | POA: Diagnosis not present

## 2020-05-29 DIAGNOSIS — G629 Polyneuropathy, unspecified: Secondary | ICD-10-CM | POA: Diagnosis not present

## 2020-05-29 DIAGNOSIS — M6281 Muscle weakness (generalized): Secondary | ICD-10-CM | POA: Diagnosis not present

## 2020-05-29 DIAGNOSIS — K219 Gastro-esophageal reflux disease without esophagitis: Secondary | ICD-10-CM | POA: Diagnosis not present

## 2020-05-29 DIAGNOSIS — E785 Hyperlipidemia, unspecified: Secondary | ICD-10-CM | POA: Diagnosis not present

## 2020-05-29 DIAGNOSIS — I509 Heart failure, unspecified: Secondary | ICD-10-CM | POA: Diagnosis not present

## 2020-05-29 LAB — RESPIRATORY PANEL BY RT PCR (FLU A&B, COVID)
Influenza A by PCR: NEGATIVE
Influenza B by PCR: NEGATIVE
SARS Coronavirus 2 by RT PCR: NEGATIVE

## 2020-05-29 MED ORDER — METHOCARBAMOL 500 MG PO TABS: 500.0000 mg | ORAL_TABLET | Freq: Three times a day (TID) | ORAL | 1 refills | Status: DC | PRN

## 2020-05-29 MED ORDER — HYDROCODONE-ACETAMINOPHEN 5-325 MG PO TABS: 1.0000 | ORAL_TABLET | ORAL | 0 refills | Status: DC | PRN

## 2020-05-29 MED ORDER — METHOCARBAMOL 500 MG PO TABS
500.0000 mg | ORAL_TABLET | Freq: Three times a day (TID) | ORAL | 1 refills | Status: DC | PRN
Start: 2020-05-29 — End: 2020-05-29

## 2020-05-29 NOTE — Plan of Care (Addendum)
  Problem: Safety: Goal: Ability to remain free from injury will improve Outcome: Completed/Met   Problem: Education: Goal: Ability to verbalize activity precautions or restrictions will improve Outcome: Completed/Met Goal: Knowledge of the prescribed therapeutic regimen will improve Outcome: Completed/Met Goal: Understanding of discharge needs will improve Outcome: Completed/Met   Problem: Activity: Goal: Ability to avoid complications of mobility impairment will improve Outcome: Completed/Met Goal: Ability to tolerate increased activity will improve Outcome: Completed/Met Goal: Will remain free from falls Outcome: Completed/Met   Problem: Clinical Measurements: Goal: Ability to maintain clinical measurements within normal limits will improve Outcome: Completed/Met Goal: Postoperative complications will be avoided or minimized Outcome: Completed/Met Goal: Diagnostic test results will improve Outcome: Completed/Met   Problem: Pain Management: Goal: Pain level will decrease Outcome: Completed/Met   Problem: Skin Integrity: Goal: Will show signs of wound healing Outcome: Completed/Met   Problem: Health Behavior/Discharge Planning: Goal: Identification of resources available to assist in meeting health care needs will improve Outcome: Completed/Met   Problem: Acute Rehab PT Goals(only PT should resolve) Goal: Pt Will Go Supine/Side To Sit Outcome: Completed/Met Goal: Patient Will Transfer Sit To/From Stand Outcome: Completed/Met Goal: Pt Will Transfer Bed To Chair/Chair To Bed Outcome: Completed/Met Goal: Pt Will Ambulate Outcome: Completed/Met   Problem: Acute Rehab OT Goals (only OT should resolve) Goal: Pt. Will Perform Lower Body Bathing Outcome: Completed/Met Goal: Pt. Will Perform Lower Body Dressing Outcome: Completed/Met Goal: Pt. Will Transfer To Toilet Outcome: Completed/Met Goal: Pt. Will Perform Tub/Shower Transfer Outcome: Completed/Met Goal: OT  Additional ADL Goal #1 Outcome: Completed/Met Goal: OT Additional ADL Goal #2 Outcome: Completed/Met   Patient discharged to SNF via PTAR accompanied by 2 attendants.  Attempts made x 2 to call report to receiving facility, and have been informed that they are soon to return my call.  Discharge instructions given as part of packet to attendants.

## 2020-05-29 NOTE — Discharge Instructions (Signed)
Ok to take dressing and leave it off [place] to shower No baths (where submerging wound)

## 2020-05-29 NOTE — Discharge Summary (Signed)
   Physician Discharge Summary  Patient ID: Hunter Hunter [name] MRN: [id number] DOB/AGE: 1933-09-14 86 y.o.  Admit date: 05/24/2020 Discharge date: 05/29/2020  Admission Diagnoses:  1. L3-4 severe stenosis with neurogenic claudication  Discharge Diagnoses:  Same Active Problems:   Lumbar stenosis with neurogenic claudication   Discharged Condition: Stable  Hospital Course:  Hunter Hunter [name] is a 86 y.o. male who underwent the below surgery and tolerated it well. PT/OT eval the patient and recommended SNF placement due to assistance needed and living alone. His preop claudicant pain improved and his is now able to ambulate further distances. He was tolerating a normal diet, and had his pain controlled with oral medication upon DC. He was having normal bowel and bladder function upon discharge. His incision was clean dry and intact.  Treatments: Surgery - L3-4 open laminectomy  Discharge Exam: Blood pressure (!) 111/52, pulse 60, temperature 97.7 F (36.5 Hunter [name]), temperature source Oral, resp. rate 18, height 5\' 10"  (1.778 m), weight 99.8 kg, SpO2 97 %. Awake, alert, oriented Speech fluent, appropriate CN grossly intact 5/5 BUE/BLE Wound Hunter [name]/d/i  Disposition: Discharge disposition: 03-Skilled Nursing Facility       Discharge Instructions    Incentive spirometry RT   Complete by: As directed      Allergies as of 05/29/2020   No Known Allergies     Medication List    STOP taking these medications   PRESCRIPTION MEDICATION     TAKE these medications   amLODipine 2.5 MG tablet Commonly known as: NORVASC TAKE 1 TABLET BY MOUTH DAILY   aspirin 81 MG chewable tablet Chew 81 mg by mouth daily.   atorvastatin 20 MG tablet Commonly known as: LIPITOR TAKE 1 TABLET BY MOUTH DAILY   ferrous sulfate 325 (65 FE) MG tablet Take 325 mg by mouth daily with breakfast.   furosemide 20 MG tablet Commonly known as: LASIX TAKE THREE TABLETS BY MOUTH DAILY    HYDROcodone-acetaminophen 5-325 MG tablet Commonly known as: NORCO/VICODIN Take 1 tablet by mouth every 4 (four) hours as needed for moderate pain ((score 4 to 6)).   levothyroxine 75 MCG tablet Commonly known as: SYNTHROID TAKE 1 TABLET BY MOUTH EVERY MORNING What changed: when to take this   losartan 50 MG tablet Commonly known as: COZAAR Take 1 tablet (50 mg total) by mouth daily.   methocarbamol 500 MG tablet Commonly known as: ROBAXIN Take 1 tablet (500 mg total) by mouth every 8 (eight) hours as needed for muscle spasms.   sertraline 100 MG tablet Commonly known as: ZOLOFT TAKE TWO TABLETS BY MOUTH EVERY DAY       Contact information for follow-up providers    Hunter Wander C, DO Follow up in 4 week(s).   Why: call for followup appointment Contact information: [address] [place] [address] [place]  [address] [phone]            Contact information for after-discharge care    [facility] .   Service: Skilled Nursing Contact information: [address] [place] [phone]                  Signed: Theodoro Doing Hortense Hunter [name] 05/29/2020, 9:46 AM

## 2020-05-29 NOTE — Plan of Care (Signed)
  Problem: Safety: Goal: Ability to remain free from injury will improve Outcome: Progressing   Problem: Education: Goal: Ability to verbalize activity precautions or restrictions will improve Outcome: Progressing   Problem: Pain Management: Goal: Pain level will decrease Outcome: Progressing

## 2020-05-29 NOTE — Care Management Important Message (Signed)
Important Message  Patient Details  Name: LORIN GAWRON MRN: [id number] Date of Birth: [birth year]   Medicare Important Message Given:  Yes     Zanaria Morell P [place] 05/29/2020, 3:27 PM

## 2020-05-29 NOTE — TOC Transition Note (Signed)
Transition of Care [facility] - CM/SW Discharge Note   Patient Details  Name: Hunter [name] MRN: [id number] Date of Birth: 05-18-1934  Transition of [facility]) CM/SW Contact:  Emeterio Reeve, [place] Phone Number: 05/29/2020, 12:42 PM   Clinical Narrative:     Pt will discharge to [facility] via ptar. Pts covid test is negative. Pt has been informed of discharge. Pt declined informing son of transfer. [facility] has been called.  Nurse to call report to [phone], Ask for [place]   Final next level of care: Skilled Nursing Facility Barriers to Discharge: Barriers Resolved   Patient Goals and CMS Choice Patient states their goals for this hospitalization and ongoing recovery are:: Return home      Discharge Placement              Patient chooses bed at: Other - please specify in the comment section below: ([facility]) Patient to be transferred to facility by: [facility] Name of family member notified: Patient notified Patient and family notified of of transfer: 05/29/20  Discharge Plan and Services                                     Social Determinants of Health (SDOH) Interventions     Readmission Risk Interventions No flowsheet data found.  Emeterio Reeve, Latanya Presser, [place] Social Worker [phone]

## 2020-05-30 ENCOUNTER — Other Ambulatory Visit: Payer: Self-pay | Admitting: Family Medicine

## 2020-05-30 ENCOUNTER — Other Ambulatory Visit: Payer: Self-pay | Admitting: *Deleted

## 2020-05-30 ENCOUNTER — Telehealth: Payer: Self-pay

## 2020-05-30 DIAGNOSIS — E785 Hyperlipidemia, unspecified: Secondary | ICD-10-CM | POA: Diagnosis not present

## 2020-05-30 DIAGNOSIS — I10 Essential (primary) hypertension: Secondary | ICD-10-CM | POA: Diagnosis not present

## 2020-05-30 DIAGNOSIS — M48062 Spinal stenosis, lumbar region with neurogenic claudication: Secondary | ICD-10-CM | POA: Diagnosis not present

## 2020-05-30 DIAGNOSIS — D649 Anemia, unspecified: Secondary | ICD-10-CM | POA: Diagnosis not present

## 2020-05-30 DIAGNOSIS — Z9889 Other specified postprocedural states: Secondary | ICD-10-CM | POA: Diagnosis not present

## 2020-05-30 NOTE — Patient Outreach (Signed)
[place] [facility]) Care Management  05/30/2020  Hunter [name] 1934-01-15 [id number]   Notified by hospital liaison that member discharged after surgery to SNF for rehab.  Will close case at this time, will anticipate new referral pending SNF discharge.  Valente David, [place], MSN [place] [phone]

## 2020-06-04 DIAGNOSIS — M48062 Spinal stenosis, lumbar region with neurogenic claudication: Secondary | ICD-10-CM | POA: Diagnosis not present

## 2020-06-05 ENCOUNTER — Ambulatory Visit: Payer: Self-pay | Admitting: *Deleted

## 2020-06-16 NOTE — Progress Notes (Deleted)
Phone [phone] In person visit   Subjective:   Hunter [name] is a 86 y.o. year old very pleasant male patient who presents for/with See problem oriented charting No chief complaint on file.   This visit occurred during the SARS-CoV-2 public health emergency.  Safety protocols were in place, including screening questions prior to the visit, additional usage of staff PPE, and extensive cleaning of exam room while observing appropriate contact time as indicated for disinfecting solutions.   Past Medical History-  Patient Active Problem List   Diagnosis Date Noted  . Lumbar stenosis with neurogenic claudication 05/24/2020  . Mild cognitive impairment 06/21/2019  . Generalized weakness 10/13/2018  . Acute blood loss as cause of postoperative anemia 10/26/2017  . Delirium 10/24/2017  . Hypokalemia 10/24/2017  . Pulmonary edema 10/24/2017  . Cardiac pacemaker 10/22/2017  . Fall 10/21/2017  . Fracture of unspecified part of neck of left femur, initial encounter for closed fracture ([place]) 10/21/2017  . Complete heart block ([place]) 02/08/2017  . BPH (benign prostatic hyperplasia) 05/10/2015  . Skin lesion of left lower extremity 03/26/2014  . Stasis eczema 03/26/2014  . History of skin cancer 03/26/2014  . Heart failure with preserved ejection fraction (HFpEF)  12/06/2013  . Syncope 08/14/2013  . 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 Thrombocytopenia ([place]) 05/23/2012  . Neuropathy ([place]) 11/02/2011  . CKD (chronic kidney disease) 05/27/2011  . Low back pain 04/08/2011  . LBBB (left bundle branch block) 05/12/2010  . Abnormality of gait 04/23/2010  . Spinal stenosis in cervical region 03/21/2010  . Constipation 03/19/2010  . Urinary frequency 09/05/2009  . Hypothyroidism 08/15/2008  . Vitamin D deficiency 08/15/2008  . Anemia 08/15/2008  . Venous stasis 08/15/2008  . Hyperlipidemia 02/02/2007  . Major depression in full remission ([place]) 02/02/2007  . HTN  (hypertension) 02/02/2007  . Allergic rhinitis 02/02/2007  . GERD (gastroesophageal reflux disease) 02/02/2007    Medications- reviewed and updated Current Outpatient Medications  Medication Sig Dispense Refill  . amLODipine (NORVASC) 2.5 MG tablet TAKE 1 TABLET BY MOUTH DAILY (Patient taking differently: Take 2.5 mg by mouth daily. ) 90 tablet 0  . aspirin 81 MG chewable tablet Chew 81 mg by mouth daily.     Marland Kitchen atorvastatin (LIPITOR) 20 MG tablet TAKE 1 TABLET BY MOUTH DAILY 90 tablet 3  . ferrous sulfate 325 (65 FE) MG tablet Take 325 mg by mouth daily with breakfast.    . furosemide (LASIX) 20 MG tablet TAKE THREE TABLETS BY MOUTH DAILY (Patient taking differently: Take 60 mg by mouth daily. ) 270 tablet 0  . HYDROcodone-acetaminophen (NORCO/VICODIN) 5-325 MG tablet Take 1 tablet by mouth every 4 (four) hours as needed for moderate pain ((score 4 to 6)). 30 tablet 0  . levothyroxine (SYNTHROID) 75 MCG tablet TAKE 1 TABLET BY MOUTH EVERY MORNING 90 tablet 1  . losartan (COZAAR) 50 MG tablet Take 1 tablet (50 mg total) by mouth daily. 90 tablet 1  . methocarbamol (ROBAXIN) 500 MG tablet Take 1 tablet (500 mg total) by mouth every 8 (eight) hours as needed for muscle spasms. 90 tablet 1  . sertraline (ZOLOFT) 100 MG tablet TAKE TWO TABLETS BY MOUTH EVERY DAY (Patient taking differently: Take 200 mg by mouth daily. ) 180 tablet 0   No current facility-administered medications for this visit.     Objective:  There were no vitals taken for this visit. Gen: NAD, resting comfortably CV: RRR no murmurs rubs or gallops  Lungs: CTAB no crackles, wheeze, rhonchi Abdomen: soft/nontender/nondistended/normal bowel sounds. No rebound or guarding.  Ext: no edema Skin: warm, dry Neuro: grossly normal, moves all extremities  ***    Assessment and Plan  *** *** awv 05/19/2018   ***As such, he meets criteria for a mild neurocognitive disorder (formerly "mild cognitive impairment") at the present  time, likely moderate severity.  ***tospoium trial Dr. Wendy Poet. failed [place] for [employer], Weldon Picking, CMA  [place], [place], RN Cc: Yong Channel Brayton Mars, MD [facility],   Please see referral information below. Marked urgent by patient's MD.   Thank you  Az[place]e Course   Reason for consult Severe risk for hospitalization-currently displaced from home living in motel. Has not had access to medications   Diagnoses of Heart Failure   Expected date of contact 1-3 days (reserved for hospital discharges)   ***does he need aspirin?  No problem-specific Assessment & Plan notes found for this encounter.   Recommended follow up: ***No follow-ups on file. Future Appointments  Date Time Provider [place]  06/17/2020  2:20 PM [name], MD [facility]  07/08/2020  8:55 AM [facility] DEVICE REMOTES [facility]  10/07/2020  8:55 AM [facility] DEVICE REMOTES [facility]  12/13/2020 10:30 AM Allred, Jeneen Rinks, MD [facility]  01/06/2021  8:55 AM [facility] DEVICE REMOTES [facility]  04/07/2021  8:55 AM [facility] DEVICE REMOTES [facility]    Lab/Order associations: No diagnosis found.  No orders of the defined types were placed in this encounter.   Time Spent: *** minutes of total time (7:09 PM***- 7:09 PM***) was spent on the date of the encounter performing the following actions: chart review prior to seeing the patient, obtaining history, performing a medically necessary exam, counseling on the treatment plan, placing orders, and documenting in our EHR.   Return precautions advised.  Clyde Lundborg, CMA

## 2020-06-16 NOTE — Patient Instructions (Incomplete)
Health Maintenance Due  Topic Date Due  . INFLUENZA VACCINE  02/11/2020   Depression screen [facility] 2/9 04/30/2020 04/16/2020 02/09/2020  Decreased Interest 0 - 1  Down, Depressed, Hopeless 1 3 1   PHQ - 2 Score 1 3 2   Altered sleeping - - 0  Tired, decreased energy - - 0  Change in appetite - - 0  Feeling bad or failure about yourself  - - 0  Trouble concentrating - - 1  Moving slowly or fidgety/restless - - 0  Suicidal thoughts - - 0  PHQ-9 Score - - 3  Difficult doing work/chores - - -  Some recent data might be hidden

## 2020-06-17 ENCOUNTER — Ambulatory Visit: Payer: Medicare Other | Admitting: Family Medicine

## 2020-07-15 ENCOUNTER — Other Ambulatory Visit: Payer: Self-pay | Admitting: Family Medicine

## 2020-07-26 DIAGNOSIS — M25562 Pain in left knee: Secondary | ICD-10-CM | POA: Diagnosis not present

## 2020-08-02 NOTE — Telephone Encounter (Signed)
error 

## 2020-08-06 ENCOUNTER — Telehealth: Payer: Self-pay

## 2020-08-06 DIAGNOSIS — I447 Left bundle-branch block, unspecified: Secondary | ICD-10-CM | POA: Diagnosis not present

## 2020-08-06 DIAGNOSIS — R404 Transient alteration of awareness: Secondary | ICD-10-CM | POA: Diagnosis not present

## 2020-08-06 DIAGNOSIS — I517 Cardiomegaly: Secondary | ICD-10-CM | POA: Diagnosis not present

## 2020-08-06 DIAGNOSIS — R069 Unspecified abnormalities of breathing: Secondary | ICD-10-CM | POA: Diagnosis not present

## 2020-08-06 DIAGNOSIS — E876 Hypokalemia: Secondary | ICD-10-CM | POA: Diagnosis not present

## 2020-08-06 DIAGNOSIS — J9600 Acute respiratory failure, unspecified whether with hypoxia or hypercapnia: Secondary | ICD-10-CM | POA: Diagnosis not present

## 2020-08-06 DIAGNOSIS — R0902 Hypoxemia: Secondary | ICD-10-CM | POA: Diagnosis not present

## 2020-08-06 DIAGNOSIS — J1282 Pneumonia due to coronavirus disease 2019: Secondary | ICD-10-CM | POA: Diagnosis not present

## 2020-08-06 DIAGNOSIS — H9190 Unspecified hearing loss, unspecified ear: Secondary | ICD-10-CM | POA: Diagnosis not present

## 2020-08-06 DIAGNOSIS — U071 COVID-19: Secondary | ICD-10-CM | POA: Diagnosis not present

## 2020-08-06 DIAGNOSIS — Z743 Need for continuous supervision: Secondary | ICD-10-CM | POA: Diagnosis not present

## 2020-08-06 DIAGNOSIS — N182 Chronic kidney disease, stage 2 (mild): Secondary | ICD-10-CM | POA: Diagnosis not present

## 2020-08-06 DIAGNOSIS — Z87891 Personal history of nicotine dependence: Secondary | ICD-10-CM | POA: Diagnosis not present

## 2020-08-06 DIAGNOSIS — N179 Acute kidney failure, unspecified: Secondary | ICD-10-CM | POA: Diagnosis not present

## 2020-08-06 DIAGNOSIS — Z7982 Long term (current) use of aspirin: Secondary | ICD-10-CM | POA: Diagnosis not present

## 2020-08-06 DIAGNOSIS — I2510 Atherosclerotic heart disease of native coronary artery without angina pectoris: Secondary | ICD-10-CM | POA: Diagnosis not present

## 2020-08-06 DIAGNOSIS — Z950 Presence of cardiac pacemaker: Secondary | ICD-10-CM | POA: Diagnosis not present

## 2020-08-06 DIAGNOSIS — E860 Dehydration: Secondary | ICD-10-CM | POA: Diagnosis not present

## 2020-08-06 DIAGNOSIS — R410 Disorientation, unspecified: Secondary | ICD-10-CM | POA: Diagnosis not present

## 2020-08-06 DIAGNOSIS — J9811 Atelectasis: Secondary | ICD-10-CM | POA: Diagnosis not present

## 2020-08-06 DIAGNOSIS — Z5941 Food insecurity: Secondary | ICD-10-CM | POA: Diagnosis not present

## 2020-08-06 DIAGNOSIS — I5032 Chronic diastolic (congestive) heart failure: Secondary | ICD-10-CM | POA: Diagnosis not present

## 2020-08-06 DIAGNOSIS — R0602 Shortness of breath: Secondary | ICD-10-CM | POA: Diagnosis not present

## 2020-08-06 NOTE — Telephone Encounter (Signed)
I instructed team to call patient and instruct to call 911- I heard my teammate making this call several minutes ago- hoping patient will comply  with 911

## 2020-08-06 NOTE — Telephone Encounter (Signed)
Asked pt if he was safe. He says he is cold. He says he has heat , but has been having to put on emergency heat. He states he has no money. I asked if he ate and he said he drank [place] orange juice today, but hopefully tomorrow he would have money to buy some groceries.

## 2020-08-06 NOTE — Telephone Encounter (Signed)
See below

## 2020-08-06 NOTE — Telephone Encounter (Signed)
Pt has runny nose, shortness of breath, coughing, pains in right chest ( hurts when breathing or bending over). He states he can barely walk 5 steps. Pt states he has nothing in the place where he lives other than a bed, refrigerator, and microwave, nothing other than that. Pt states his car was confiscated, because he didn't have a license plate on it. He has someone that lets them borrow their car to see Dr. Yong Channel. Pt wants to be seen in office because of his pacemaker and states he really needs to be checked out. Pt has been using a wheelchair the past 3 days in his apartment because it is so hard to breath. I told pt that I would send message to Dr. Yong Channel and see what Dr. Yong Channel recommends and call him back.      Pt states his son took a cast iron flower holder that was 3 or 4 ft high and 3 or 4 ft round and held it into patient on the pacemaker side of his body. He was taken to [facility], then [facility].

## 2020-08-13 ENCOUNTER — Telehealth: Payer: Self-pay

## 2020-08-13 NOTE — Telephone Encounter (Signed)
FYI

## 2020-08-13 NOTE — Telephone Encounter (Signed)
Called Hunter [name] back and advised message below, she states she understood and scheduled a hospital follow up but the emergency contacts that are listed the phone number is out of service. She is unsure if he will be able to make the appointment, as he has no one to pick him up.

## 2020-08-13 NOTE — Telephone Encounter (Signed)
Vaughan Basta is calling in from [facility], stating patient is currently in the hospital, was having confusion and was admitted to the hospital with pneumonia due to [place]. Vaughan Basta is wondering if Dr.Hunter could sign off on their orders for Physical Therapy, and is getting a referral for a Education officer, museum.

## 2020-08-13 NOTE — Telephone Encounter (Signed)
What about virtual? The virtual element would have to work for face to face but if in person isnt possible we could at least use this to sign off on orders for home health

## 2020-08-13 NOTE — Telephone Encounter (Signed)
See below

## 2020-08-13 NOTE — Telephone Encounter (Signed)
Yes thanks but will need face to face visit to be able to sign off formally

## 2020-08-14 NOTE — Telephone Encounter (Signed)
LVM asking to call back to clarify if he would be able to do in office or virtual.

## 2020-08-14 NOTE — Telephone Encounter (Signed)
Patient is calling back, stated he has no transportation or vehicle and does not know how to access a MyChart account.

## 2020-08-14 NOTE — Telephone Encounter (Signed)
Ok to schedule virtual if pt agrees.

## 2020-08-15 ENCOUNTER — Telehealth: Payer: Self-pay

## 2020-08-15 DIAGNOSIS — E039 Hypothyroidism, unspecified: Secondary | ICD-10-CM | POA: Diagnosis not present

## 2020-08-15 DIAGNOSIS — Z7901 Long term (current) use of anticoagulants: Secondary | ICD-10-CM | POA: Diagnosis not present

## 2020-08-15 DIAGNOSIS — U071 COVID-19: Secondary | ICD-10-CM | POA: Diagnosis not present

## 2020-08-15 DIAGNOSIS — Z9183 Wandering in diseases classified elsewhere: Secondary | ICD-10-CM | POA: Diagnosis not present

## 2020-08-15 DIAGNOSIS — J1282 Pneumonia due to coronavirus disease 2019: Secondary | ICD-10-CM | POA: Diagnosis not present

## 2020-08-15 DIAGNOSIS — I10 Essential (primary) hypertension: Secondary | ICD-10-CM | POA: Diagnosis not present

## 2020-08-15 DIAGNOSIS — H9190 Unspecified hearing loss, unspecified ear: Secondary | ICD-10-CM | POA: Diagnosis not present

## 2020-08-15 DIAGNOSIS — F32A Depression, unspecified: Secondary | ICD-10-CM | POA: Diagnosis not present

## 2020-08-15 DIAGNOSIS — E785 Hyperlipidemia, unspecified: Secondary | ICD-10-CM | POA: Diagnosis not present

## 2020-08-15 DIAGNOSIS — D649 Anemia, unspecified: Secondary | ICD-10-CM | POA: Diagnosis not present

## 2020-08-15 DIAGNOSIS — I2510 Atherosclerotic heart disease of native coronary artery without angina pectoris: Secondary | ICD-10-CM | POA: Diagnosis not present

## 2020-08-15 DIAGNOSIS — Z950 Presence of cardiac pacemaker: Secondary | ICD-10-CM | POA: Diagnosis not present

## 2020-08-15 NOTE — Patient Instructions (Incomplete)
Health Maintenance Due  Topic Date Due  . INFLUENZA VACCINE  02/11/2020  . COVID-19 Vaccine (3 - Booster for Pfizer series) 06/07/2020   Depression screen PHQ 2/9 04/30/2020 04/16/2020 02/09/2020  Decreased Interest 0 - 1  Down, Depressed, Hopeless 1 3 1   PHQ - 2 Score 1 3 2   Altered sleeping - - 0  Tired, decreased energy - - 0  Change in appetite - - 0  Feeling bad or failure about yourself  - - 0  Trouble concentrating - - 1  Moving slowly or fidgety/restless - - 0  Suicidal thoughts - - 0  PHQ-9 Score - - 3  Difficult doing work/chores - - -  Some recent data might be hidden

## 2020-08-15 NOTE — Telephone Encounter (Signed)
.  Home Health verbal orders-caller/Agency: [facility]   Callback number: [phone]  Requesting OT/PT/Skilled nursing/Social Work/Speech: PT   Reason:  Frequency: 2x week x2 weeks and 1x week x3 weeks   Patient also needs PT eval  Medical social services Eval  And add PRN nurse visits as needed within that time  Home health nurse also said she is going to work with him on medication management as far as what he takes and why he takes it. Patient stated he did not know what medication were for what

## 2020-08-15 NOTE — Progress Notes (Deleted)
Phone [phone]   Subjective:  Hunter [name] is a 87 y.o. year old very pleasant male patient who presents for transitional care management and hospital follow up for Delirium. Patient was hospitalized from 08/06/20 to 08/14/20. A TCM phone call was completed on ***. Medical complexity ***   ***   See problem oriented charting as well  Past Medical History-  Patient Active Problem List   Diagnosis Date Noted  . Lumbar stenosis with neurogenic claudication 05/24/2020  . Mild cognitive impairment 06/21/2019  . Generalized weakness 10/13/2018  . Acute blood loss as cause of postoperative anemia 10/26/2017  . Delirium 10/24/2017  . Hypokalemia 10/24/2017  . Pulmonary edema 10/24/2017  . Cardiac pacemaker 10/22/2017  . Fall 10/21/2017  . Fracture of unspecified part of neck of left femur, initial encounter for closed fracture ([place]) 10/21/2017  . Complete heart block ([place]) 02/08/2017  . BPH (benign prostatic hyperplasia) 05/10/2015  . Skin lesion of left lower extremity 03/26/2014  . Stasis eczema 03/26/2014  . History of skin cancer 03/26/2014  . Heart failure with preserved ejection fraction (HFpEF)  12/06/2013  . Syncope 08/14/2013  . 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 Thrombocytopenia ([place]) 05/23/2012  . Neuropathy ([place]) 11/02/2011  . CKD (chronic kidney disease) 05/27/2011  . Low back pain 04/08/2011  . LBBB (left bundle branch block) 05/12/2010  . Abnormality of gait 04/23/2010  . Spinal stenosis in cervical region 03/21/2010  . Constipation 03/19/2010  . Urinary frequency 09/05/2009  . Hypothyroidism 08/15/2008  . Vitamin D deficiency 08/15/2008  . Anemia 08/15/2008  . Venous stasis 08/15/2008  . Hyperlipidemia 02/02/2007  . Major depression in full remission ([place]) 02/02/2007  . HTN (hypertension) 02/02/2007  . Allergic rhinitis 02/02/2007  . GERD (gastroesophageal reflux disease) 02/02/2007    Medications- reviewed and updated  A  medical reconciliation was performed comparing current medicines to hospital discharge medications. Current Outpatient Medications  Medication Sig Dispense Refill  . amLODipine (NORVASC) 2.5 MG tablet TAKE 1 TABLET BY MOUTH DAILY 30 tablet 0  . aspirin 81 MG chewable tablet Chew 81 mg by mouth daily.     Marland Kitchen atorvastatin (LIPITOR) 20 MG tablet TAKE 1 TABLET BY MOUTH DAILY 90 tablet 3  . ferrous sulfate 325 (65 FE) MG tablet Take 325 mg by mouth daily with breakfast.    . furosemide (LASIX) 20 MG tablet TAKE THREE TABLETS BY MOUTH DAILY 90 tablet 0  . HYDROcodone-acetaminophen (NORCO/VICODIN) 5-325 MG tablet Take 1 tablet by mouth every 4 (four) hours as needed for moderate pain ((score 4 to 6)). 30 tablet 0  . levothyroxine (SYNTHROID) 75 MCG tablet TAKE 1 TABLET BY MOUTH EVERY MORNING 90 tablet 1  . losartan (COZAAR) 50 MG tablet Take 1 tablet (50 mg total) by mouth daily. 90 tablet 1  . methocarbamol (ROBAXIN) 500 MG tablet Take 1 tablet (500 mg total) by mouth every 8 (eight) hours as needed for muscle spasms. 90 tablet 1  . sertraline (ZOLOFT) 100 MG tablet TAKE TWO TABLETS BY MOUTH EVERY DAY (Patient taking differently: Take 200 mg by mouth daily. ) 180 tablet 0   No current facility-administered medications for this visit.   Objective  Objective:  There were no vitals taken for this visit. Gen: NAD, resting comfortably CV: RRR no murmurs rubs or gallops Lungs: CTAB no crackles, wheeze, rhonchi Abdomen: soft/nontender/nondistended/normal bowel sounds. No rebound or guarding.  Ext: no edema Skin: warm, dry Neuro: grossly normal, moves all extremities  ***  Assessment and Plan:   ***   No problem-specific Assessment & Plan notes found for this encounter.   Recommended follow up: ***No follow-ups on file. Future Appointments  Date Time Provider [place]  08/16/2020 11:00 AM [name], MD [facility]  10/07/2020  8:55 AM [facility] DEVICE REMOTES [facility]  12/13/2020 10:30 AM [name], MD [facility]  01/06/2021  8:55 AM [facility] DEVICE REMOTES [facility]  04/07/2021  8:55 AM [facility] DEVICE REMOTES [facility]    Lab/Order associations: No diagnosis found.  No orders of the defined types were placed in this encounter.   Return precautions advised.  Clyde Lundborg, CMA

## 2020-08-15 NOTE — Telephone Encounter (Signed)
Called and lm on Kristi VM with VO.

## 2020-08-16 ENCOUNTER — Inpatient Hospital Stay: Payer: Medicare Other | Admitting: Family Medicine

## 2020-08-17 DIAGNOSIS — I10 Essential (primary) hypertension: Secondary | ICD-10-CM | POA: Diagnosis not present

## 2020-08-17 DIAGNOSIS — H9190 Unspecified hearing loss, unspecified ear: Secondary | ICD-10-CM | POA: Diagnosis not present

## 2020-08-17 DIAGNOSIS — U071 COVID-19: Secondary | ICD-10-CM | POA: Diagnosis not present

## 2020-08-17 DIAGNOSIS — J1282 Pneumonia due to coronavirus disease 2019: Secondary | ICD-10-CM | POA: Diagnosis not present

## 2020-08-17 DIAGNOSIS — Z950 Presence of cardiac pacemaker: Secondary | ICD-10-CM | POA: Diagnosis not present

## 2020-08-17 DIAGNOSIS — F32A Depression, unspecified: Secondary | ICD-10-CM | POA: Diagnosis not present

## 2020-08-17 DIAGNOSIS — Z7901 Long term (current) use of anticoagulants: Secondary | ICD-10-CM | POA: Diagnosis not present

## 2020-08-17 DIAGNOSIS — E039 Hypothyroidism, unspecified: Secondary | ICD-10-CM | POA: Diagnosis not present

## 2020-08-17 DIAGNOSIS — Z9183 Wandering in diseases classified elsewhere: Secondary | ICD-10-CM | POA: Diagnosis not present

## 2020-08-17 DIAGNOSIS — D649 Anemia, unspecified: Secondary | ICD-10-CM | POA: Diagnosis not present

## 2020-08-17 DIAGNOSIS — I2510 Atherosclerotic heart disease of native coronary artery without angina pectoris: Secondary | ICD-10-CM | POA: Diagnosis not present

## 2020-08-17 DIAGNOSIS — E785 Hyperlipidemia, unspecified: Secondary | ICD-10-CM | POA: Diagnosis not present

## 2020-08-19 DIAGNOSIS — I10 Essential (primary) hypertension: Secondary | ICD-10-CM | POA: Diagnosis not present

## 2020-08-19 DIAGNOSIS — H9190 Unspecified hearing loss, unspecified ear: Secondary | ICD-10-CM | POA: Diagnosis not present

## 2020-08-19 DIAGNOSIS — Z950 Presence of cardiac pacemaker: Secondary | ICD-10-CM | POA: Diagnosis not present

## 2020-08-19 DIAGNOSIS — Z9183 Wandering in diseases classified elsewhere: Secondary | ICD-10-CM | POA: Diagnosis not present

## 2020-08-19 DIAGNOSIS — I2510 Atherosclerotic heart disease of native coronary artery without angina pectoris: Secondary | ICD-10-CM | POA: Diagnosis not present

## 2020-08-19 DIAGNOSIS — D649 Anemia, unspecified: Secondary | ICD-10-CM | POA: Diagnosis not present

## 2020-08-19 DIAGNOSIS — U071 COVID-19: Secondary | ICD-10-CM | POA: Diagnosis not present

## 2020-08-19 DIAGNOSIS — E039 Hypothyroidism, unspecified: Secondary | ICD-10-CM | POA: Diagnosis not present

## 2020-08-19 DIAGNOSIS — F32A Depression, unspecified: Secondary | ICD-10-CM | POA: Diagnosis not present

## 2020-08-19 DIAGNOSIS — Z7901 Long term (current) use of anticoagulants: Secondary | ICD-10-CM | POA: Diagnosis not present

## 2020-08-19 DIAGNOSIS — J1282 Pneumonia due to coronavirus disease 2019: Secondary | ICD-10-CM | POA: Diagnosis not present

## 2020-08-19 DIAGNOSIS — E785 Hyperlipidemia, unspecified: Secondary | ICD-10-CM | POA: Diagnosis not present

## 2020-08-20 DIAGNOSIS — U071 COVID-19: Secondary | ICD-10-CM | POA: Diagnosis not present

## 2020-08-20 DIAGNOSIS — F32A Depression, unspecified: Secondary | ICD-10-CM | POA: Diagnosis not present

## 2020-08-20 DIAGNOSIS — H9190 Unspecified hearing loss, unspecified ear: Secondary | ICD-10-CM | POA: Diagnosis not present

## 2020-08-20 DIAGNOSIS — E785 Hyperlipidemia, unspecified: Secondary | ICD-10-CM | POA: Diagnosis not present

## 2020-08-20 DIAGNOSIS — Z950 Presence of cardiac pacemaker: Secondary | ICD-10-CM | POA: Diagnosis not present

## 2020-08-20 DIAGNOSIS — I2510 Atherosclerotic heart disease of native coronary artery without angina pectoris: Secondary | ICD-10-CM | POA: Diagnosis not present

## 2020-08-20 DIAGNOSIS — J1282 Pneumonia due to coronavirus disease 2019: Secondary | ICD-10-CM | POA: Diagnosis not present

## 2020-08-20 DIAGNOSIS — Z7901 Long term (current) use of anticoagulants: Secondary | ICD-10-CM | POA: Diagnosis not present

## 2020-08-20 DIAGNOSIS — D649 Anemia, unspecified: Secondary | ICD-10-CM | POA: Diagnosis not present

## 2020-08-20 DIAGNOSIS — E039 Hypothyroidism, unspecified: Secondary | ICD-10-CM | POA: Diagnosis not present

## 2020-08-20 DIAGNOSIS — I10 Essential (primary) hypertension: Secondary | ICD-10-CM | POA: Diagnosis not present

## 2020-08-20 DIAGNOSIS — Z9183 Wandering in diseases classified elsewhere: Secondary | ICD-10-CM | POA: Diagnosis not present

## 2020-08-21 ENCOUNTER — Telehealth: Payer: Self-pay

## 2020-08-21 DIAGNOSIS — Z950 Presence of cardiac pacemaker: Secondary | ICD-10-CM | POA: Diagnosis not present

## 2020-08-21 DIAGNOSIS — Z9183 Wandering in diseases classified elsewhere: Secondary | ICD-10-CM | POA: Diagnosis not present

## 2020-08-21 DIAGNOSIS — Z7901 Long term (current) use of anticoagulants: Secondary | ICD-10-CM | POA: Diagnosis not present

## 2020-08-21 DIAGNOSIS — E039 Hypothyroidism, unspecified: Secondary | ICD-10-CM | POA: Diagnosis not present

## 2020-08-21 DIAGNOSIS — D649 Anemia, unspecified: Secondary | ICD-10-CM | POA: Diagnosis not present

## 2020-08-21 DIAGNOSIS — H9190 Unspecified hearing loss, unspecified ear: Secondary | ICD-10-CM | POA: Diagnosis not present

## 2020-08-21 DIAGNOSIS — E785 Hyperlipidemia, unspecified: Secondary | ICD-10-CM | POA: Diagnosis not present

## 2020-08-21 DIAGNOSIS — J1282 Pneumonia due to coronavirus disease 2019: Secondary | ICD-10-CM | POA: Diagnosis not present

## 2020-08-21 DIAGNOSIS — I10 Essential (primary) hypertension: Secondary | ICD-10-CM | POA: Diagnosis not present

## 2020-08-21 DIAGNOSIS — F32A Depression, unspecified: Secondary | ICD-10-CM | POA: Diagnosis not present

## 2020-08-21 DIAGNOSIS — U071 COVID-19: Secondary | ICD-10-CM | POA: Diagnosis not present

## 2020-08-21 DIAGNOSIS — I2510 Atherosclerotic heart disease of native coronary artery without angina pectoris: Secondary | ICD-10-CM | POA: Diagnosis not present

## 2020-08-21 NOTE — Telephone Encounter (Signed)
Home Health Certification or Plan of Care Tracking  Is this a Certification and Plan of Care  [facility] Agency: [place]   Order Number:  [id number]   Where has form been placed:  Placed in Hunter's basket up front

## 2020-08-21 NOTE — Telephone Encounter (Signed)
Noted  

## 2020-08-22 ENCOUNTER — Telehealth: Payer: Self-pay

## 2020-08-22 NOTE — Telephone Encounter (Signed)
Caller returning a call to office. He is drowsy and has not eaten anything today. He has been dizzy all afternoon.

## 2020-08-22 NOTE — Telephone Encounter (Signed)
Also called Transportation # on back of insurance card and was unable to get any transportation arranged. Just called to see how protocol works with that and insurance stated they are unable to find member. But they were given all information on the same card # is on

## 2020-08-22 NOTE — Telephone Encounter (Signed)
Called patient to notify and get scheduled for apt with hunter to get home health orders signed off.  Please schedule ASAP with hunter jazz gave permission to use a same day.

## 2020-08-23 DIAGNOSIS — U071 COVID-19: Secondary | ICD-10-CM | POA: Diagnosis not present

## 2020-08-23 DIAGNOSIS — E039 Hypothyroidism, unspecified: Secondary | ICD-10-CM | POA: Diagnosis not present

## 2020-08-23 DIAGNOSIS — Z9183 Wandering in diseases classified elsewhere: Secondary | ICD-10-CM | POA: Diagnosis not present

## 2020-08-23 DIAGNOSIS — E785 Hyperlipidemia, unspecified: Secondary | ICD-10-CM | POA: Diagnosis not present

## 2020-08-23 DIAGNOSIS — I2510 Atherosclerotic heart disease of native coronary artery without angina pectoris: Secondary | ICD-10-CM | POA: Diagnosis not present

## 2020-08-23 DIAGNOSIS — I10 Essential (primary) hypertension: Secondary | ICD-10-CM | POA: Diagnosis not present

## 2020-08-23 DIAGNOSIS — F32A Depression, unspecified: Secondary | ICD-10-CM | POA: Diagnosis not present

## 2020-08-23 DIAGNOSIS — Z950 Presence of cardiac pacemaker: Secondary | ICD-10-CM | POA: Diagnosis not present

## 2020-08-23 DIAGNOSIS — H9190 Unspecified hearing loss, unspecified ear: Secondary | ICD-10-CM | POA: Diagnosis not present

## 2020-08-23 DIAGNOSIS — D649 Anemia, unspecified: Secondary | ICD-10-CM | POA: Diagnosis not present

## 2020-08-23 DIAGNOSIS — J1282 Pneumonia due to coronavirus disease 2019: Secondary | ICD-10-CM | POA: Diagnosis not present

## 2020-08-23 DIAGNOSIS — Z7901 Long term (current) use of anticoagulants: Secondary | ICD-10-CM | POA: Diagnosis not present

## 2020-08-23 NOTE — Telephone Encounter (Signed)
See previous encounter, Hunter [name] had called to get pt scheduled for an appointment.

## 2020-08-23 NOTE — Telephone Encounter (Signed)
Pt returned call

## 2020-08-27 ENCOUNTER — Other Ambulatory Visit: Payer: Self-pay | Admitting: Family Medicine

## 2020-08-28 DIAGNOSIS — Z7901 Long term (current) use of anticoagulants: Secondary | ICD-10-CM | POA: Diagnosis not present

## 2020-08-28 DIAGNOSIS — H9190 Unspecified hearing loss, unspecified ear: Secondary | ICD-10-CM | POA: Diagnosis not present

## 2020-08-28 DIAGNOSIS — Z950 Presence of cardiac pacemaker: Secondary | ICD-10-CM | POA: Diagnosis not present

## 2020-08-28 DIAGNOSIS — I2510 Atherosclerotic heart disease of native coronary artery without angina pectoris: Secondary | ICD-10-CM | POA: Diagnosis not present

## 2020-08-28 DIAGNOSIS — D649 Anemia, unspecified: Secondary | ICD-10-CM | POA: Diagnosis not present

## 2020-08-28 DIAGNOSIS — U071 COVID-19: Secondary | ICD-10-CM | POA: Diagnosis not present

## 2020-08-28 DIAGNOSIS — E039 Hypothyroidism, unspecified: Secondary | ICD-10-CM | POA: Diagnosis not present

## 2020-08-28 DIAGNOSIS — F32A Depression, unspecified: Secondary | ICD-10-CM | POA: Diagnosis not present

## 2020-08-28 DIAGNOSIS — I10 Essential (primary) hypertension: Secondary | ICD-10-CM | POA: Diagnosis not present

## 2020-08-28 DIAGNOSIS — Z9183 Wandering in diseases classified elsewhere: Secondary | ICD-10-CM | POA: Diagnosis not present

## 2020-08-28 DIAGNOSIS — E785 Hyperlipidemia, unspecified: Secondary | ICD-10-CM | POA: Diagnosis not present

## 2020-08-28 DIAGNOSIS — J1282 Pneumonia due to coronavirus disease 2019: Secondary | ICD-10-CM | POA: Diagnosis not present

## 2020-08-30 DIAGNOSIS — I10 Essential (primary) hypertension: Secondary | ICD-10-CM | POA: Diagnosis not present

## 2020-08-30 DIAGNOSIS — U071 COVID-19: Secondary | ICD-10-CM | POA: Diagnosis not present

## 2020-08-30 DIAGNOSIS — Z950 Presence of cardiac pacemaker: Secondary | ICD-10-CM | POA: Diagnosis not present

## 2020-08-30 DIAGNOSIS — H9190 Unspecified hearing loss, unspecified ear: Secondary | ICD-10-CM | POA: Diagnosis not present

## 2020-08-30 DIAGNOSIS — Z7901 Long term (current) use of anticoagulants: Secondary | ICD-10-CM | POA: Diagnosis not present

## 2020-08-30 DIAGNOSIS — E785 Hyperlipidemia, unspecified: Secondary | ICD-10-CM | POA: Diagnosis not present

## 2020-08-30 DIAGNOSIS — E039 Hypothyroidism, unspecified: Secondary | ICD-10-CM | POA: Diagnosis not present

## 2020-08-30 DIAGNOSIS — J1282 Pneumonia due to coronavirus disease 2019: Secondary | ICD-10-CM | POA: Diagnosis not present

## 2020-08-30 DIAGNOSIS — F32A Depression, unspecified: Secondary | ICD-10-CM | POA: Diagnosis not present

## 2020-08-30 DIAGNOSIS — D649 Anemia, unspecified: Secondary | ICD-10-CM | POA: Diagnosis not present

## 2020-08-30 DIAGNOSIS — I2510 Atherosclerotic heart disease of native coronary artery without angina pectoris: Secondary | ICD-10-CM | POA: Diagnosis not present

## 2020-08-30 DIAGNOSIS — Z9183 Wandering in diseases classified elsewhere: Secondary | ICD-10-CM | POA: Diagnosis not present

## 2020-09-04 DIAGNOSIS — E785 Hyperlipidemia, unspecified: Secondary | ICD-10-CM | POA: Diagnosis not present

## 2020-09-04 DIAGNOSIS — D649 Anemia, unspecified: Secondary | ICD-10-CM | POA: Diagnosis not present

## 2020-09-04 DIAGNOSIS — E039 Hypothyroidism, unspecified: Secondary | ICD-10-CM | POA: Diagnosis not present

## 2020-09-04 DIAGNOSIS — U071 COVID-19: Secondary | ICD-10-CM | POA: Diagnosis not present

## 2020-09-04 DIAGNOSIS — I10 Essential (primary) hypertension: Secondary | ICD-10-CM | POA: Diagnosis not present

## 2020-09-04 DIAGNOSIS — Z950 Presence of cardiac pacemaker: Secondary | ICD-10-CM | POA: Diagnosis not present

## 2020-09-04 DIAGNOSIS — J1282 Pneumonia due to coronavirus disease 2019: Secondary | ICD-10-CM | POA: Diagnosis not present

## 2020-09-04 DIAGNOSIS — Z7901 Long term (current) use of anticoagulants: Secondary | ICD-10-CM | POA: Diagnosis not present

## 2020-09-04 DIAGNOSIS — Z9183 Wandering in diseases classified elsewhere: Secondary | ICD-10-CM | POA: Diagnosis not present

## 2020-09-04 DIAGNOSIS — I2510 Atherosclerotic heart disease of native coronary artery without angina pectoris: Secondary | ICD-10-CM | POA: Diagnosis not present

## 2020-09-04 DIAGNOSIS — F32A Depression, unspecified: Secondary | ICD-10-CM | POA: Diagnosis not present

## 2020-09-04 DIAGNOSIS — H9190 Unspecified hearing loss, unspecified ear: Secondary | ICD-10-CM | POA: Diagnosis not present

## 2020-09-05 DIAGNOSIS — F32A Depression, unspecified: Secondary | ICD-10-CM | POA: Diagnosis not present

## 2020-09-05 DIAGNOSIS — E039 Hypothyroidism, unspecified: Secondary | ICD-10-CM | POA: Diagnosis not present

## 2020-09-05 DIAGNOSIS — Z950 Presence of cardiac pacemaker: Secondary | ICD-10-CM | POA: Diagnosis not present

## 2020-09-05 DIAGNOSIS — D649 Anemia, unspecified: Secondary | ICD-10-CM | POA: Diagnosis not present

## 2020-09-05 DIAGNOSIS — I2510 Atherosclerotic heart disease of native coronary artery without angina pectoris: Secondary | ICD-10-CM | POA: Diagnosis not present

## 2020-09-05 DIAGNOSIS — Z9183 Wandering in diseases classified elsewhere: Secondary | ICD-10-CM | POA: Diagnosis not present

## 2020-09-05 DIAGNOSIS — H9190 Unspecified hearing loss, unspecified ear: Secondary | ICD-10-CM | POA: Diagnosis not present

## 2020-09-05 DIAGNOSIS — I10 Essential (primary) hypertension: Secondary | ICD-10-CM | POA: Diagnosis not present

## 2020-09-05 DIAGNOSIS — Z7901 Long term (current) use of anticoagulants: Secondary | ICD-10-CM | POA: Diagnosis not present

## 2020-09-05 DIAGNOSIS — J1282 Pneumonia due to coronavirus disease 2019: Secondary | ICD-10-CM | POA: Diagnosis not present

## 2020-09-05 DIAGNOSIS — U071 COVID-19: Secondary | ICD-10-CM | POA: Diagnosis not present

## 2020-09-05 DIAGNOSIS — E785 Hyperlipidemia, unspecified: Secondary | ICD-10-CM | POA: Diagnosis not present

## 2020-09-11 DIAGNOSIS — F32A Depression, unspecified: Secondary | ICD-10-CM | POA: Diagnosis not present

## 2020-09-11 DIAGNOSIS — H9190 Unspecified hearing loss, unspecified ear: Secondary | ICD-10-CM | POA: Diagnosis not present

## 2020-09-11 DIAGNOSIS — U071 COVID-19: Secondary | ICD-10-CM | POA: Diagnosis not present

## 2020-09-11 DIAGNOSIS — E039 Hypothyroidism, unspecified: Secondary | ICD-10-CM | POA: Diagnosis not present

## 2020-09-11 DIAGNOSIS — Z7901 Long term (current) use of anticoagulants: Secondary | ICD-10-CM | POA: Diagnosis not present

## 2020-09-11 DIAGNOSIS — E785 Hyperlipidemia, unspecified: Secondary | ICD-10-CM | POA: Diagnosis not present

## 2020-09-11 DIAGNOSIS — Z9183 Wandering in diseases classified elsewhere: Secondary | ICD-10-CM | POA: Diagnosis not present

## 2020-09-11 DIAGNOSIS — Z950 Presence of cardiac pacemaker: Secondary | ICD-10-CM | POA: Diagnosis not present

## 2020-09-11 DIAGNOSIS — I2510 Atherosclerotic heart disease of native coronary artery without angina pectoris: Secondary | ICD-10-CM | POA: Diagnosis not present

## 2020-09-11 DIAGNOSIS — I10 Essential (primary) hypertension: Secondary | ICD-10-CM | POA: Diagnosis not present

## 2020-09-11 DIAGNOSIS — D649 Anemia, unspecified: Secondary | ICD-10-CM | POA: Diagnosis not present

## 2020-09-11 DIAGNOSIS — J1282 Pneumonia due to coronavirus disease 2019: Secondary | ICD-10-CM | POA: Diagnosis not present

## 2020-09-25 ENCOUNTER — Other Ambulatory Visit: Payer: Self-pay | Admitting: Family Medicine

## 2020-09-25 NOTE — Telephone Encounter (Signed)
Please schedule an appointment with Dr.Hunter for further refills 

## 2020-10-08 ENCOUNTER — Other Ambulatory Visit: Payer: Self-pay | Admitting: Family Medicine

## 2020-10-11 ENCOUNTER — Encounter: Payer: Self-pay | Admitting: Internal Medicine

## 2020-10-11 ENCOUNTER — Ambulatory Visit (INDEPENDENT_AMBULATORY_CARE_PROVIDER_SITE_OTHER): Payer: Medicare Other | Admitting: Internal Medicine

## 2020-10-11 ENCOUNTER — Telehealth: Payer: Self-pay

## 2020-10-11 VITALS — BP 180/80 | HR 70 | Ht 71.0 in | Wt 231.0 lb

## 2020-10-11 DIAGNOSIS — I442 Atrioventricular block, complete: Secondary | ICD-10-CM

## 2020-10-11 DIAGNOSIS — I10 Essential (primary) hypertension: Secondary | ICD-10-CM | POA: Diagnosis not present

## 2020-10-11 DIAGNOSIS — I5033 Acute on chronic diastolic (congestive) heart failure: Secondary | ICD-10-CM

## 2020-10-11 MED ORDER — SERTRALINE HCL 100 MG PO TABS: 100.0000 mg | ORAL_TABLET | Freq: Every day | ORAL | Status: DC

## 2020-10-11 NOTE — Patient Instructions (Addendum)
Medication Instructions:  Continue all current medications.  Labwork: none  Testing/Procedures: none  Follow-Up:  1 year - Dr. Rayann Heman   Establish with routine cardiology.  Any Other Special Instructions Will Be Listed Below (If Applicable). 2 gm low sodium diet info given  If you need a refill on your cardiac medications before your next appointment, please call your pharmacy.

## 2020-10-11 NOTE — Telephone Encounter (Signed)
I ordered the patient monitor 10-11-2020

## 2020-10-11 NOTE — Progress Notes (Signed)
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  He has had issues with homelessness.  In addition, he has medical noncompliance.   He is not taking his diuretic as scheduled.  he has SOB and edema.  Today, he denies symptoms of palpitations, chest pain, dizziness, presyncope, or syncope.  The patient is otherwise without complaint today.   Past Medical History:  Diagnosis Date  . Allergic rhinitis 02/02/2007  . Anemia   . Arthritis    "knees" (02/08/2017)  . Chronic lower back pain   . CKD (chronic kidney disease), stage III ([place]) 05/27/2011  . Complete heart block 02/08/2017  . Eczema   . Essential hypertension 02/02/2007   Lasix 60 mg, losartan 50mg ,  Amlodipine 5mg --> 2.5 mg.  Usually have to repeat BP measurements  In the past-Maxzide 37.5-25mg .  . GERD (gastroesophageal reflux disease) occasional  . Hearing loss of both ears   . Heart failure with preserved ejection fraction (HFpEF) 12/06/2013  . History of skin cancer 03/26/2014   nose   . Hx of echocardiogram    Echo (07/2013): Mild LVH, EF 16-10%, grade 1 diastolic dysfunction, mild LAE, PASP 23  . Hyperlipidemia   . Hypertension   . Hypothyroidism   . LBBB (left bundle branch block)   . Left patella fracture 2018   "no OR" (02/08/2017)  . Major depression in partial remission 02/02/2007   Amitriptyline 25mg  for sleep (stop when runs out of #10 04/2018(, zoloft 100-->150mg --> 200mg   . Mild neurocognitive disorder 06/21/2019  . Neuropathy ([place]) 11/02/2011  . Nocturia   . Peripheral vascular disease (HCC) LOWER EXTREMITIES  . Spinal stenosis in cervical region 03/21/2010  . Squamous cell skin cancer, penis: glans ([place]) 05/2010   Initial excision 11/11; recurrence, excision and laser Rx 9/13  . Thrombocytopenia ([place]) 05/23/2012  . Urinary frequency 09/05/2009   Possible BPH- see Shawna Orleans notes   . Vitamin D deficiency 08/15/2008    Past Surgical History:  Procedure Laterality Date  . CIRCUMCISION/ LASER DISSECTION PENILE GLANS CANCER  06-02-2010  . CYSTOSCOPY WITH URETHRAL DILATATION  03/28/2012   Procedure: CYSTOSCOPY WITH URETHRAL DILATATION;  Surgeon: Ailene Rud, MD;  [facility];  Service: Urology;  Laterality: N/A;  excision biopsy extensive meatal penile carcinoma meatoplasty  . EXCISION RIGHT WRIST BENIGN TUMOR  2002 (APPROX)  . hip surgery    . INCISION AND DRAINAGE DEEP NECK ABSCESS    . INGUINAL HERNIA REPAIR  1970's  . KNEE ARTHROSCOPY Right 2017  . LUMBAR LAMINECTOMY/DECOMPRESSION MICRODISCECTOMY N/A 05/24/2020   Procedure: OPEN LAMINECTOMY LUMBAR THREE-LUMBAR FOUR;  Surgeon: Karsten Ro, DO;  Location: [facility];  Service: Neurosurgery;  Laterality: N/A;  . PACEMAKER IMPLANT N/A 02/08/2017   Procedure: Pacemaker Implant;  Surgeon: Deboraha Sprang, MD;  Location: [facility];  Service: Cardiovascular;  Laterality: N/A;  . PACEMAKER IMPLANT  02/08/2017   SJM Assurity MRI dual chamber PPM implanted by Dr Caryl Comes for complete heart block  . PENILE BX  05-09-2010  . TONSILLECTOMY    . TRANSURETHRAL RESECTION OF PROSTATE N/A 05/10/2015   Procedure: CYSTOSCOPY, URETHRAL MEATAL DILATION, TRANSURETHRAL RESECTION OF THE PROSTATE (TURP);  Surgeon: Carolan Clines, MD;  Location: [facility];  Service: Urology;  Laterality: N/A;    ROS- all systems are reviewed and negative except as per HPI above  Current Outpatient Medications  Medication Sig Dispense Refill  . amLODipine (NORVASC) 2.5 MG tablet Take 1 tablet (2.5 mg total) by mouth daily. 30 tablet 0  . aspirin 81 MG chewable tablet Chew 81 mg by mouth daily.     Marland Kitchen atorvastatin (LIPITOR) 20 MG tablet TAKE 1 TABLET BY MOUTH DAILY 90 tablet 3  . ferrous sulfate 325 (65 FE) MG tablet Take 325 mg by mouth daily with breakfast.    . furosemide (LASIX) 20 MG tablet TAKE THREE TABLETS BY MOUTH DAILY 90 tablet 0  .  HYDROcodone-acetaminophen (NORCO/VICODIN) 5-325 MG tablet Take 1 tablet by mouth every 4 (four) hours as needed for moderate pain ((score 4 to 6)). 30 tablet 0  . levothyroxine (SYNTHROID) 75 MCG tablet TAKE 1 TABLET BY MOUTH EVERY MORNING 90 tablet 1  . losartan (COZAAR) 50 MG tablet Take 1 tablet (50 mg total) by mouth daily. 90 tablet 1  . methocarbamol (ROBAXIN) 500 MG tablet Take 1 tablet (500 mg total) by mouth every 8 (eight) hours as needed for muscle spasms. 90 tablet 1  . sertraline (ZOLOFT) 100 MG tablet TAKE TWO TABLETS BY MOUTH EVERY DAY (Patient taking differently: Take 200 mg by mouth daily. ) 180 tablet 0   No current facility-administered medications for this visit.    Physical Exam: Vitals:   10/11/20 1135  BP: (!) 180/80  Pulse: 70  SpO2: 98%  Weight: 231 lb (104.8 kg)  Height: 5\' 11"  (1.803 m)    GEN- The patient is dissheveled and poorly kept appearing, alert and oriented x 3 today.   Head- normocephalic, atraumatic Eyes-  Sclera clear, conjunctiva pink Ears- hearing intact Oropharynx- clear Lungs- Clear to ausculation bilaterally, normal work of breathing Chest- pacemaker pocket is well healed Heart- Regular rate and rhythm,  GI- soft, NT, ND, + BS Extremities- no clubbing, cyanosis, +2 chronic L>R edema  Pacemaker interrogation- reviewed in detail today,  See PACEART report    Assessment and Plan:  1. Symptomatic complete heart block Normal pacemaker function See Pace Art report No changes today he is device dependant today  2. HTN Elevated He does not wish to make changes today.    3. HL Continue lipitor  4. Acute on chronic diastolic dysfunction Diet is very poor.  In addition, he does not take diuretic as prescribed. We discussed at length today the importance of compliance with sodium restriction and medical therapy.  He would benefit from establishing with general cardiology.  He should follow with cardiology team closely.  Risks,  benefits and potential toxicities for medications prescribed and/or refilled reviewed with patient today.   Return to see me in a year   [name] MD, [facility] 10/11/2020 11:37 AM
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
## 2020-10-17 NOTE — Patient Instructions (Signed)
Health Maintenance Due  Topic Date Due  . COVID-19 Vaccine (3 - Booster for Pfizer series) 06/07/2020   Depression screen PHQ 2/9 04/30/2020 04/16/2020 02/09/2020  Decreased Interest 0 - 1  Down, Depressed, Hopeless 1 3 1   PHQ - 2 Score 1 3 2   Altered sleeping - - 0  Tired, decreased energy - - 0  Change in appetite - - 0  Feeling bad or failure about yourself  - - 0  Trouble concentrating - - 1  Moving slowly or fidgety/restless - - 0  Suicidal thoughts - - 0  PHQ-9 Score - - 3  Difficult doing work/chores - - -  Some recent data might be hidden

## 2020-10-21 ENCOUNTER — Telehealth (INDEPENDENT_AMBULATORY_CARE_PROVIDER_SITE_OTHER): Payer: Medicare Other | Admitting: Family Medicine

## 2020-10-21 ENCOUNTER — Encounter: Payer: Self-pay | Admitting: Family Medicine

## 2020-10-21 DIAGNOSIS — M48061 Spinal stenosis, lumbar region without neurogenic claudication: Secondary | ICD-10-CM

## 2020-10-21 DIAGNOSIS — F3341 Major depressive disorder, recurrent, in partial remission: Secondary | ICD-10-CM | POA: Diagnosis not present

## 2020-10-21 MED ORDER — BLOOD PRESSURE KIT: 1.0000 | PACK | Freq: Once | 0 refills | Status: AC

## 2020-10-21 MED ORDER — SERTRALINE HCL 100 MG PO TABS: 200.0000 mg | ORAL_TABLET | Freq: Every day | ORAL | 3 refills | Status: DC

## 2020-10-21 MED ORDER — FUROSEMIDE 20 MG PO TABS: 60.0000 mg | ORAL_TABLET | Freq: Every day | ORAL | 1 refills | Status: DC

## 2020-10-24 DIAGNOSIS — M48062 Spinal stenosis, lumbar region with neurogenic claudication: Secondary | ICD-10-CM | POA: Diagnosis not present

## 2020-10-28 ENCOUNTER — Other Ambulatory Visit: Payer: Self-pay | Admitting: Family Medicine

## 2020-11-01 DIAGNOSIS — M48062 Spinal stenosis, lumbar region with neurogenic claudication: Secondary | ICD-10-CM | POA: Diagnosis not present

## 2020-11-04 DIAGNOSIS — M48062 Spinal stenosis, lumbar region with neurogenic claudication: Secondary | ICD-10-CM | POA: Diagnosis not present

## 2020-11-05 ENCOUNTER — Ambulatory Visit: Payer: Medicare Other | Admitting: Family Medicine

## 2020-11-05 DIAGNOSIS — M79672 Pain in left foot: Secondary | ICD-10-CM | POA: Diagnosis not present

## 2020-11-05 DIAGNOSIS — M1990 Unspecified osteoarthritis, unspecified site: Secondary | ICD-10-CM | POA: Diagnosis not present

## 2020-11-05 DIAGNOSIS — M25579 Pain in unspecified ankle and joints of unspecified foot: Secondary | ICD-10-CM | POA: Diagnosis not present

## 2020-11-05 DIAGNOSIS — M79671 Pain in right foot: Secondary | ICD-10-CM | POA: Diagnosis not present

## 2020-11-06 DIAGNOSIS — M48062 Spinal stenosis, lumbar region with neurogenic claudication: Secondary | ICD-10-CM | POA: Diagnosis not present

## 2020-11-11 DIAGNOSIS — M48062 Spinal stenosis, lumbar region with neurogenic claudication: Secondary | ICD-10-CM | POA: Diagnosis not present

## 2020-11-13 DIAGNOSIS — M48062 Spinal stenosis, lumbar region with neurogenic claudication: Secondary | ICD-10-CM | POA: Diagnosis not present

## 2020-11-15 ENCOUNTER — Telehealth: Payer: Self-pay | Admitting: Family Medicine

## 2020-11-15 NOTE — Chronic Care Management (AMB) (Signed)
  Chronic Care Management   Outreach Note  11/15/2020 Name: JERICK KHACHATRYAN MRN: [id number] DOB: [birth year]  Referred by: [name], MD Reason for referral : No chief complaint on file.   An unsuccessful telephone outreach was attempted today. The patient was referred to the pharmacist for assistance with care management and care coordination.   Follow Up Plan:   Lauretta Grill Upstream Scheduler

## 2020-11-18 DIAGNOSIS — M48062 Spinal stenosis, lumbar region with neurogenic claudication: Secondary | ICD-10-CM | POA: Diagnosis not present

## 2020-11-20 DIAGNOSIS — M48062 Spinal stenosis, lumbar region with neurogenic claudication: Secondary | ICD-10-CM | POA: Diagnosis not present

## 2020-11-25 DIAGNOSIS — M48062 Spinal stenosis, lumbar region with neurogenic claudication: Secondary | ICD-10-CM | POA: Diagnosis not present

## 2020-11-26 ENCOUNTER — Ambulatory Visit: Payer: Medicare Other | Admitting: Cardiology

## 2020-11-27 ENCOUNTER — Other Ambulatory Visit: Payer: Self-pay | Admitting: Family Medicine

## 2020-11-27 DIAGNOSIS — M48062 Spinal stenosis, lumbar region with neurogenic claudication: Secondary | ICD-10-CM | POA: Diagnosis not present

## 2020-12-05 NOTE — Progress Notes (Signed)
Cardiology Office Note  Date: 12/06/2020   ID: CLAYVON PARLETT, DOB [birth year], MRN [id number]  PCP:  [name], MD  Cardiologist:  [name], MD Electrophysiologist:  [name], MD   Chief Complaint: 76-month follow-up  History of Present Illness: Hunter [name] is a 87 y.o. male with a history of HTN, HFpEF, complete heart block/pacemaker, LBBB, CKD 3, anemia  He was last seen by Dr. Rayann Heman on 10/11/2020 for routine electrophysiology follow-up.  He was having issues with homelessness and medical noncompliance.  He was not taking his diuretic as ordered.  He was having some shortness of breath and edema.  He denied any palpitations, chest pain, dizziness, syncope or presyncope.  He had normal pacemaker function.  He was device dependent on that visit.  His blood pressure was elevated but he did not wish to make any medication changes.  He was continuing Lipitor.  In reference to chronic diastolic dysfunction.  His diet was very poor and he was not taking his diuretic as prescribed.  Dr. Rayann Heman discussed at length the importance of compliance and sodium restriction along with adhering to medical therapy.  He mentioned patient would benefit from establishing with general cardiology and should follow-up with cardiology team closely.   He is here today with his significant other.  States he has been having some swelling and some shortness of breath.  His significant other states he is not taking his diuretic as he should be.  She also states he is drinking pretty much what he wants.  States he drinks a lot of liquids including milk, soft drinks, etc.  We discussed guidelines for managing and heart failure including sodium restriction, fluid restriction, daily weights and reporting weight gains of 3 pounds in 24 hours and/or 5 pounds in 1 week.  Patient and significant other did not seem to be familiar with these guidelines.  At last visit with Dr. Rayann Heman he was advised to be compliant with  his diuretics.  Otherwise he denies any issues.    Past Medical History:  Diagnosis Date  . Allergic rhinitis 02/02/2007  . Anemia   . Arthritis    "knees" (02/08/2017)  . Chronic lower back pain   . CKD (chronic kidney disease), stage III ([place]) 05/27/2011  . Complete heart block 02/08/2017  . Eczema   . Essential hypertension 02/02/2007   Lasix 60 mg, losartan 50mg ,  Amlodipine 5mg --> 2.5 mg.  Usually have to repeat BP measurements  In the past-Maxzide 37.5-25mg .  . GERD (gastroesophageal reflux disease) occasional  . Hearing loss of both ears   . Heart failure with preserved ejection fraction (HFpEF) 12/06/2013  . History of skin cancer 03/26/2014   nose   . Hx of echocardiogram    Echo (07/2013): Mild LVH, EF 40-98%, grade 1 diastolic dysfunction, mild LAE, PASP 23  . Hyperlipidemia   . Hypertension   . Hypothyroidism   . LBBB (left bundle branch block)   . Left patella fracture 2018   "no OR" (02/08/2017)  . Major depression in partial remission 02/02/2007   Amitriptyline 25mg  for sleep (stop when runs out of #10 04/2018(, zoloft 100-->150mg --> 200mg   . Mild neurocognitive disorder 06/21/2019  . Neuropathy ([place]) 11/02/2011  . Nocturia   . Peripheral vascular disease (HCC) LOWER EXTREMITIES  . Spinal stenosis in cervical region 03/21/2010  . Squamous cell skin cancer, penis: glans ([place]) 05/2010   Initial excision 11/11; recurrence, excision and laser Rx 9/13  . Thrombocytopenia ([place]) 05/23/2012  .  Urinary frequency 09/05/2009   Possible BPH- see Shawna Orleans notes   . Vitamin D deficiency 08/15/2008    Past Surgical History:  Procedure Laterality Date  . CIRCUMCISION/ LASER DISSECTION PENILE GLANS CANCER  06-02-2010  . CYSTOSCOPY WITH URETHRAL DILATATION  03/28/2012   Procedure: CYSTOSCOPY WITH URETHRAL DILATATION;  Surgeon: Ailene Rud, MD;  Location: [facility];  Service: Urology;  Laterality: N/A;  excision biopsy extensive meatal penile carcinoma meatoplasty   . EXCISION RIGHT WRIST BENIGN TUMOR  2002 (APPROX)  . hip surgery    . INCISION AND DRAINAGE DEEP NECK ABSCESS    . INGUINAL HERNIA REPAIR  1970's  . KNEE ARTHROSCOPY Right 2017  . LUMBAR LAMINECTOMY/DECOMPRESSION MICRODISCECTOMY N/A 05/24/2020   Procedure: OPEN LAMINECTOMY LUMBAR THREE-LUMBAR FOUR;  Surgeon: Karsten Ro, DO;  Location: [facility];  Service: Neurosurgery;  Laterality: N/A;  . PACEMAKER IMPLANT N/A 02/08/2017   Procedure: Pacemaker Implant;  Surgeon: Deboraha Sprang, MD;  Location: [facility];  Service: Cardiovascular;  Laterality: N/A;  . PACEMAKER IMPLANT  02/08/2017   SJM Assurity MRI dual chamber PPM implanted by Dr Caryl Comes for complete heart block  . PENILE BX  05-09-2010  . TONSILLECTOMY    . TRANSURETHRAL RESECTION OF PROSTATE N/A 05/10/2015   Procedure: CYSTOSCOPY, URETHRAL MEATAL DILATION, TRANSURETHRAL RESECTION OF THE PROSTATE (TURP);  Surgeon: Carolan Clines, MD;  Location[facility];  Service: Urology;  Laterality: N/A;    Current Outpatient Medications  Medication Sig Dispense Refill  . amLODipine (NORVASC) 2.5 MG tablet TAKE 1 TABLET BY MOUTH DAILY 30 tablet 0  . aspirin 81 MG chewable tablet Chew 81 mg by mouth daily.     Marland Kitchen atorvastatin (LIPITOR) 20 MG tablet TAKE 1 TABLET BY MOUTH DAILY 90 tablet 3  . ferrous sulfate 325 (65 FE) MG tablet Take 325 mg by mouth daily with breakfast.    . furosemide (LASIX) 20 MG tablet Take 3 tablets (60 mg total) by mouth daily. 270 tablet 1  . levothyroxine (SYNTHROID) 75 MCG tablet TAKE 1 TABLET BY MOUTH EVERY MORNING 90 tablet 1  . losartan (COZAAR) 50 MG tablet Take 1 tablet (50 mg total) by mouth daily. 90 tablet 1  . sertraline (ZOLOFT) 100 MG tablet Take 2 tablets (200 mg total) by mouth daily. 180 tablet 3   No current facility-administered medications for this visit.   Allergies:  Patient has no known allergies.   Social History: The patient  reports that he has never smoked. He has never used smokeless  tobacco. He reports current alcohol use. He reports that he does not use drugs.   Family History: The patient's family history includes Arthritis in an other family member; Hyperlipidemia in an other family member; Hypertension in an other family member; Lung cancer in his mother; Prostate cancer in an other family member.   ROS:  Please see the history of present illness. Otherwise, complete review of systems is positive for none.  All other systems are reviewed and negative.   Physical Exam: VS:  BP 124/80   Pulse 82   Ht 5\' 11"  (1.803 m)   Wt 234 lb (106.1 kg)   SpO2 96%   BMI 32.64 kg/m , BMI Body mass index is 32.64 kg/m.  Wt Readings from Last 3 Encounters:  12/06/20 234 lb (106.1 kg)  10/11/20 231 lb (104.8 kg)  05/24/20 220 lb 0.3 oz (99.8 kg)    General: Patient appears comfortable at rest. Neck: Supple, no elevated  JVP or carotid bruits, no thyromegaly. Lungs: Clear to auscultation, nonlabored breathing at rest. Cardiac: Regular rate and rhythm, no S3 or significant systolic murmur, no pericardial rub. Extremities: Mild non-pitting edema, distal pulses 2+. Skin: Warm and dry. Musculoskeletal: No kyphosis. Neuropsychiatric: Alert and oriented x3, affect grossly appropriate.  ECG:  An ECG dated 12/06/2020 was personally reviewed today and demonstrated:  Electronic ventricular pacemaker rate of 80.  Recent Labwork: 02/09/2020: TSH 4.11 04/16/2020: ALT 16; AST 21 05/25/2020: BUN 16; Creatinine, Ser 1.45; Hemoglobin 12.5; Platelets 92; Potassium 4.1; Sodium 139     Component Value Date/Time   CHOL 195 02/09/2020 1627   TRIG 238 (H) 02/09/2020 1627   TRIG 128 06/01/2006 1144   HDL 39 (L) 02/09/2020 1627   CHOLHDL 5.0 (H) 02/09/2020 1627   VLDL 37.6 02/16/2018 1213   LDLCALC 120 (H) 02/09/2020 1627   LDLDIRECT 127.0 01/03/2019 1153    Other Studies Reviewed Today:  Echocardiogram 05/26/2018 Study Conclusions   - Left ventricle: The cavity size was normal. Wall  thickness was  normal. The estimated ejection fraction was 50%. Septal motion  suggestive of LBBB or RV pacing. Doppler parameters are  consistent with abnormal left ventricular relaxation (grade 1  diastolic dysfunction).  - Aortic valve: Mildly calcified annulus. Trileaflet.  - Mitral valve: Mildly calcified annulus. There was mild  regurgitation.  - Right ventricle: Pacer wire or catheter noted in right ventricle.  - Right atrium: Central venous pressure (est): 3 mm Hg.  - Atrial septum: No defect or patent foramen ovale was identified.  - Tricuspid valve: There was trivial regurgitation.  - Pulmonary arteries: PA peak pressure: 24 mm Hg (S).  - Pericardium, extracardiac: There was no pericardial effusion.   Assessment and Plan:  1. Complete heart block ([place])   2. Essential hypertension   3. Mixed hyperlipidemia   4. Chronic diastolic heart failure ([place])    1. Complete heart block (HCC) Sees Dr. Rayann Heman.  He has a pacemaker implanted in 2018.  Recent visit with Dr. Rayann Heman.  Pacemaker was functioning normally.  He was device dependent during that visit.  Continue to follow with Dr. Rayann Heman  2. Essential hypertension Blood pressure today 124/80.  Continue amlodipine 2.5 mg p.o. daily.  Continue losartan 50 mg p.o. daily.  3. Mixed hyperlipidemia Continue atorvastatin 20 mg p.o. daily.  Continue aspirin 81 mg daily.  4. Chronic diastolic heart failure ([place]) Patient has not been compliant with his diuretic therapy.  He complains of increased shortness of breath and lower extremity edema.  His weight has increased some.  At visit with Dr. Rayann Heman he was encouraged to be compliant with his diuretic therapy.  He apparently has not been compliant with sodium and fluid restrictions.  He appears to be unaware that he needs to restrict his sodium and fluid intake.  Advised him fluid intake should be somewhere around 64 ounces daily.  Advised compliance with diuretic therapy and other  heart failure medications.  Also advised weighing daily and calling if weight gain greater than 3 pounds in 24 hours or 5 pounds in a week.  He and his significant other appear to understand and verbalized understanding.  Continue Lasix 60 mg daily.  Continue losartan 50 mg p.o. daily  Medication Adjustments/Labs and Tests Ordered: Current medicines are reviewed at length with the patient today.  Concerns regarding medicines are outlined above.   Disposition: Follow-up with Dr. Harl Bowie or APP 6 months  Signed, Levell July, NP 12/06/2020 11:13 AM  [facility] [place], [place], [place] [address] Phone: [phone]; Fax: [phone]

## 2020-12-06 ENCOUNTER — Encounter: Payer: Self-pay | Admitting: Family Medicine

## 2020-12-06 ENCOUNTER — Telehealth: Payer: Self-pay | Admitting: Emergency Medicine

## 2020-12-06 ENCOUNTER — Ambulatory Visit: Payer: Medicare Other | Admitting: Family Medicine

## 2020-12-06 VITALS — BP 124/80 | HR 82 | Ht 71.0 in | Wt 234.0 lb

## 2020-12-06 DIAGNOSIS — I10 Essential (primary) hypertension: Secondary | ICD-10-CM | POA: Diagnosis not present

## 2020-12-06 DIAGNOSIS — E782 Mixed hyperlipidemia: Secondary | ICD-10-CM | POA: Diagnosis not present

## 2020-12-06 DIAGNOSIS — I5032 Chronic diastolic (congestive) heart failure: Secondary | ICD-10-CM | POA: Diagnosis not present

## 2020-12-06 DIAGNOSIS — I442 Atrioventricular block, complete: Secondary | ICD-10-CM | POA: Diagnosis not present

## 2020-12-06 NOTE — Patient Instructions (Signed)
Medication Instructions:   Take your Lasix as directed (60mg  daily).   Continue all current medications.  Labwork: none  Testing/Procedures: none  Follow-Up: 6 months   Any Other Special Instructions Will Be Listed Below (If Applicable).  Sodium restrictions.  Fluid restriction to 60 cc per day.  Weigh daily.  Call office if have weight gain of 3 lbs in 24 hour or 5 lbs in 1 week.   If you need a refill on your cardiac medications before your next appointment, please call your pharmacy.

## 2020-12-06 NOTE — Telephone Encounter (Signed)
-----   Message from Laurine Blazer, LPN sent at 02/28/5908 11:51 AM EDT ----- Regarding: remote box Patient in office today - please reach out to patient - had questions about his remote box - would like to hook up at friends house.  Please call - [phone] - [name]   Thanks,   Edd Fabian

## 2020-12-06 NOTE — Telephone Encounter (Signed)
LMOM to call device clinic. Patient has questions about relocating Merlin monitor when he visits a friend.

## 2020-12-06 NOTE — Addendum Note (Signed)
Addended by: Laurine Blazer on: 12/06/2020 04:42 PM   Modules accepted: Orders

## 2020-12-11 NOTE — Telephone Encounter (Signed)
Hunter [name] not on DPR. Asked to have patient call the office. She reports [patient does not hear his phone and she assists with getting him to appointments. Recommended he fill out DPR to designate her to receive info in the future.

## 2020-12-11 NOTE — Telephone Encounter (Signed)
LMOM to call device clinic with #= at home #.  No answer at  # for Hunter [name] : [phone]

## 2020-12-13 ENCOUNTER — Encounter: Payer: Medicare Other | Admitting: Internal Medicine

## 2020-12-13 ENCOUNTER — Telehealth: Payer: Self-pay | Admitting: Family Medicine

## 2020-12-13 NOTE — Progress Notes (Signed)
  Chronic Care Management   Outreach Note  12/13/2020 Name: Hunter [name] MRN: [id number] DOB: 1933/09/02  Referred by: [name], MD Reason for referral : No chief complaint on file.   A second unsuccessful telephone outreach was attempted today. The patient was referred to pharmacist for assistance with care management and care coordination.  Follow Up Plan:   Lauretta Grill Upstream Scheduler

## 2020-12-20 ENCOUNTER — Encounter: Payer: Medicare Other | Admitting: Internal Medicine

## 2020-12-26 ENCOUNTER — Other Ambulatory Visit: Payer: Self-pay | Admitting: Family Medicine

## 2021-01-26 ENCOUNTER — Other Ambulatory Visit: Payer: Self-pay | Admitting: Family Medicine

## 2021-01-30 ENCOUNTER — Telehealth: Payer: Self-pay | Admitting: Family Medicine

## 2021-01-30 NOTE — Chronic Care Management (AMB) (Signed)
  Chronic Care Management   Outreach Note  01/30/2021 Name: LUISDAVID HAMBLIN MRN: [id number] DOB: 07/08/1934  Referred by: [name], MD Reason for referral : No chief complaint on file.   Third unsuccessful telephone outreach was attempted today. The patient was referred to the pharmacist for assistance with care management and care coordination.   Follow Up Plan:   Lauretta Grill Upstream Scheduler

## 2021-02-24 ENCOUNTER — Other Ambulatory Visit: Payer: Self-pay | Admitting: Family Medicine

## 2021-02-26 DIAGNOSIS — R0781 Pleurodynia: Secondary | ICD-10-CM | POA: Diagnosis not present

## 2021-03-25 ENCOUNTER — Other Ambulatory Visit: Payer: Self-pay | Admitting: Family Medicine

## 2021-04-28 ENCOUNTER — Other Ambulatory Visit: Payer: Self-pay | Admitting: Family Medicine

## 2021-05-25 ENCOUNTER — Other Ambulatory Visit: Payer: Self-pay | Admitting: Family Medicine

## 2021-06-02 ENCOUNTER — Other Ambulatory Visit: Payer: Self-pay | Admitting: Family Medicine

## 2021-06-17 ENCOUNTER — Encounter (HOSPITAL_COMMUNITY): Payer: Self-pay | Admitting: *Deleted

## 2021-06-17 ENCOUNTER — Other Ambulatory Visit: Payer: Self-pay

## 2021-06-17 ENCOUNTER — Ambulatory Visit: Admission: EM | Admit: 2021-06-17 | Discharge: 2021-06-17 | Payer: Medicare Other

## 2021-06-17 ENCOUNTER — Other Ambulatory Visit: Payer: Self-pay | Admitting: Family Medicine

## 2021-06-17 ENCOUNTER — Encounter: Payer: Self-pay | Admitting: Emergency Medicine

## 2021-06-17 ENCOUNTER — Emergency Department (HOSPITAL_COMMUNITY): Payer: Medicare Other

## 2021-06-17 ENCOUNTER — Observation Stay (HOSPITAL_COMMUNITY)
Admission: EM | Admit: 2021-06-17 | Discharge: 2021-06-18 | Disposition: A | Discharge status: Home / Self Care | Payer: Medicare Other | Attending: Internal Medicine | Admitting: Internal Medicine

## 2021-06-17 DIAGNOSIS — N183 Chronic kidney disease, stage 3 unspecified: Secondary | ICD-10-CM | POA: Diagnosis not present

## 2021-06-17 DIAGNOSIS — M7989 Other specified soft tissue disorders: Secondary | ICD-10-CM | POA: Diagnosis present

## 2021-06-17 DIAGNOSIS — G3184 Mild cognitive impairment, so stated: Secondary | ICD-10-CM | POA: Diagnosis not present

## 2021-06-17 DIAGNOSIS — Z79899 Other long term (current) drug therapy: Secondary | ICD-10-CM | POA: Diagnosis not present

## 2021-06-17 DIAGNOSIS — E669 Obesity, unspecified: Secondary | ICD-10-CM

## 2021-06-17 DIAGNOSIS — I10 Essential (primary) hypertension: Secondary | ICD-10-CM | POA: Diagnosis not present

## 2021-06-17 DIAGNOSIS — Z85828 Personal history of other malignant neoplasm of skin: Secondary | ICD-10-CM | POA: Insufficient documentation

## 2021-06-17 DIAGNOSIS — Z7982 Long term (current) use of aspirin: Secondary | ICD-10-CM | POA: Diagnosis not present

## 2021-06-17 DIAGNOSIS — F0390 Unspecified dementia without behavioral disturbance: Secondary | ICD-10-CM | POA: Diagnosis present

## 2021-06-17 DIAGNOSIS — R32 Unspecified urinary incontinence: Secondary | ICD-10-CM

## 2021-06-17 DIAGNOSIS — I130 Hypertensive heart and chronic kidney disease with heart failure and stage 1 through stage 4 chronic kidney disease, or unspecified chronic kidney disease: Secondary | ICD-10-CM | POA: Diagnosis not present

## 2021-06-17 DIAGNOSIS — Z950 Presence of cardiac pacemaker: Secondary | ICD-10-CM | POA: Insufficient documentation

## 2021-06-17 DIAGNOSIS — Z20822 Contact with and (suspected) exposure to covid-19: Secondary | ICD-10-CM | POA: Insufficient documentation

## 2021-06-17 DIAGNOSIS — E039 Hypothyroidism, unspecified: Secondary | ICD-10-CM | POA: Insufficient documentation

## 2021-06-17 DIAGNOSIS — I5030 Unspecified diastolic (congestive) heart failure: Secondary | ICD-10-CM | POA: Diagnosis not present

## 2021-06-17 DIAGNOSIS — L03115 Cellulitis of right lower limb: Secondary | ICD-10-CM

## 2021-06-17 LAB — CBC WITH DIFFERENTIAL/PLATELET
Abs Immature Granulocytes: 0.02 10*3/uL (ref 0.00–0.07)
Basophils Absolute: 0 10*3/uL (ref 0.0–0.1)
Basophils Relative: 0 %
Eosinophils Absolute: 0.1 10*3/uL (ref 0.0–0.5)
Eosinophils Relative: 1 %
HCT: 40.8 % (ref 39.0–52.0)
Hemoglobin: 13.1 g/dL (ref 13.0–17.0)
Immature Granulocytes: 0 %
Lymphocytes Relative: 31 %
Lymphs Abs: 1.7 10*3/uL (ref 0.7–4.0)
MCH: 25.9 pg — ABNORMAL LOW (ref 26.0–34.0)
MCHC: 32.1 g/dL (ref 30.0–36.0)
MCV: 80.8 fL (ref 80.0–100.0)
Monocytes Absolute: 0.5 10*3/uL (ref 0.1–1.0)
Monocytes Relative: 8 %
Neutro Abs: 3.3 10*3/uL (ref 1.7–7.7)
Neutrophils Relative %: 60 %
Platelets: 107 10*3/uL — ABNORMAL LOW (ref 150–400)
RBC: 5.05 MIL/uL (ref 4.22–5.81)
RDW: 14.2 % (ref 11.5–15.5)
WBC: 5.6 10*3/uL (ref 4.0–10.5)
nRBC: 0 % (ref 0.0–0.2)

## 2021-06-17 LAB — COMPREHENSIVE METABOLIC PANEL
ALT: 18 U/L (ref 0–44)
AST: 23 U/L (ref 15–41)
Albumin: 4 g/dL (ref 3.5–5.0)
Alkaline Phosphatase: 80 U/L (ref 38–126)
Anion gap: 9 (ref 5–15)
BUN: 30 mg/dL — ABNORMAL HIGH (ref 8–23)
CO2: 25 mmol/L (ref 22–32)
Calcium: 8.9 mg/dL (ref 8.9–10.3)
Chloride: 103 mmol/L (ref 98–111)
Creatinine, Ser: 1.41 mg/dL — ABNORMAL HIGH (ref 0.61–1.24)
GFR, Estimated: 49 mL/min — ABNORMAL LOW (ref >60)
Glucose, Bld: 101 mg/dL — ABNORMAL HIGH (ref 70–99)
Potassium: 3.5 mmol/L (ref 3.5–5.1)
Sodium: 137 mmol/L (ref 135–145)
Total Bilirubin: 0.4 mg/dL (ref 0.3–1.2)
Total Protein: 6.2 g/dL — ABNORMAL LOW (ref 6.5–8.1)

## 2021-06-17 LAB — URINALYSIS, ROUTINE W REFLEX MICROSCOPIC
Bilirubin Urine: NEGATIVE
Glucose, UA: NEGATIVE mg/dL
Hgb urine dipstick: NEGATIVE
Ketones, ur: NEGATIVE mg/dL
Leukocytes,Ua: NEGATIVE
Nitrite: NEGATIVE
Protein, ur: NEGATIVE mg/dL
Specific Gravity, Urine: 1.02 (ref 1.005–1.030)
pH: 6 (ref 5.0–8.0)

## 2021-06-17 LAB — RESP PANEL BY RT-PCR (FLU A&B, COVID) ARPGX2
Influenza A by PCR: NEGATIVE
Influenza B by PCR: NEGATIVE
SARS Coronavirus 2 by RT PCR: NEGATIVE

## 2021-06-17 MED ORDER — ATORVASTATIN CALCIUM 20 MG PO TABS
20.0000 mg | ORAL_TABLET | Freq: Every day | ORAL | Status: DC
  Administered 2021-06-18: 20 mg via ORAL
  Filled 2021-06-17: qty 1

## 2021-06-17 MED ORDER — CEFAZOLIN SODIUM-DEXTROSE 1-4 GM/50ML-% IV SOLN
1.0000 g | Freq: Three times a day (TID) | INTRAVENOUS | Status: DC
  Administered 2021-06-18 (×2): 1 g via INTRAVENOUS
  Filled 2021-06-17 (×6): qty 50

## 2021-06-17 MED ORDER — CEFAZOLIN SODIUM-DEXTROSE 1-4 GM/50ML-% IV SOLN
1.0000 g | Freq: Once | INTRAVENOUS | Status: AC
  Administered 2021-06-17: 1 g via INTRAVENOUS
  Filled 2021-06-17 (×2): qty 50

## 2021-06-17 MED ORDER — ENOXAPARIN SODIUM 40 MG/0.4ML IJ SOSY
40.0000 mg | PREFILLED_SYRINGE | INTRAMUSCULAR | Status: DC
  Administered 2021-06-18: 40 mg via SUBCUTANEOUS
  Filled 2021-06-17: qty 0.4

## 2021-06-17 MED ORDER — ACETAMINOPHEN 650 MG RE SUPP: 650.0000 mg | Freq: Four times a day (QID) | RECTAL | Status: DC | PRN

## 2021-06-17 MED ORDER — ASPIRIN 81 MG PO CHEW
81.0000 mg | CHEWABLE_TABLET | Freq: Every day | ORAL | Status: DC
  Administered 2021-06-18: 81 mg via ORAL
  Filled 2021-06-17: qty 1

## 2021-06-17 MED ORDER — AMLODIPINE BESYLATE 5 MG PO TABS
2.5000 mg | ORAL_TABLET | Freq: Every day | ORAL | Status: DC
  Administered 2021-06-18: 2.5 mg via ORAL
  Filled 2021-06-17: qty 1

## 2021-06-17 MED ORDER — ONDANSETRON HCL 4 MG PO TABS: 4.0000 mg | ORAL_TABLET | Freq: Four times a day (QID) | ORAL | Status: DC | PRN

## 2021-06-17 MED ORDER — SERTRALINE HCL 50 MG PO TABS
200.0000 mg | ORAL_TABLET | Freq: Every day | ORAL | Status: DC
  Administered 2021-06-18: 200 mg via ORAL
  Filled 2021-06-17: qty 4

## 2021-06-17 MED ORDER — LOSARTAN POTASSIUM 50 MG PO TABS
50.0000 mg | ORAL_TABLET | Freq: Every day | ORAL | Status: DC
  Administered 2021-06-18: 50 mg via ORAL
  Filled 2021-06-17: qty 1

## 2021-06-17 MED ORDER — ACETAMINOPHEN 325 MG PO TABS: 650.0000 mg | ORAL_TABLET | Freq: Four times a day (QID) | ORAL | Status: DC | PRN

## 2021-06-17 MED ORDER — LEVOTHYROXINE SODIUM 75 MCG PO TABS
75.0000 ug | ORAL_TABLET | Freq: Every morning | ORAL | Status: DC
  Administered 2021-06-18: 75 ug via ORAL
  Filled 2021-06-17: qty 1

## 2021-06-17 MED ORDER — ONDANSETRON HCL 4 MG/2ML IJ SOLN: 4.0000 mg | Freq: Four times a day (QID) | INTRAMUSCULAR | Status: DC | PRN

## 2021-06-17 MED ORDER — CEFAZOLIN SODIUM-DEXTROSE 1-4 GM/50ML-% IV SOLN
1.0000 g | Freq: Once | INTRAVENOUS | Status: DC
  Filled 2021-06-17: qty 50

## 2021-06-17 MED ORDER — SODIUM CHLORIDE 0.9 % IV BOLUS
1000.0000 mL | Freq: Once | INTRAVENOUS | Status: AC
  Administered 2021-06-17: 1000 mL via INTRAVENOUS

## 2021-06-17 MED ORDER — FUROSEMIDE 40 MG PO TABS
60.0000 mg | ORAL_TABLET | Freq: Every day | ORAL | Status: DC
  Administered 2021-06-18: 60 mg via ORAL
  Filled 2021-06-17: qty 1

## 2021-06-17 NOTE — ED Provider Notes (Signed)
[facility]    CSN: [id number] Arrival date & time: 06/17/21  1412      History   Chief Complaint No chief complaint on file.   HPI Hunter [name] is a 87 y.o. male.   Pt reports his right leg began swelling and turning red 2 days ago.  Pt states he thinks it is from asbestos.  Pt reports he recently had covid.  Pt is a poor historian.  Pt is here with a neighbor who believes this is new. She was going to take him to get hearing aids today but became concerned when she saw his leg and brought him here.   The history is provided by the patient. No language interpreter was used.   Past Medical History:  Diagnosis Date   Allergic rhinitis 02/02/2007   Anemia    Arthritis    "knees" (02/08/2017)   Chronic lower back pain    CKD (chronic kidney disease), stage III ([place]) 05/27/2011   Complete heart block 02/08/2017   Eczema    Essential hypertension 02/02/2007   Lasix 60 mg, losartan 50mg ,  Amlodipine 5mg --> 2.5 mg.  Usually have to repeat BP measurements  In the past-Maxzide 37.5-25mg .   GERD (gastroesophageal reflux disease) occasional   Hearing loss of both ears    Heart failure with preserved ejection fraction (HFpEF) 12/06/2013   History of skin cancer 03/26/2014   nose    Hx of echocardiogram    Echo (07/2013): Mild LVH, EF 40-98%, grade 1 diastolic dysfunction, mild LAE, PASP 23   Hyperlipidemia    Hypertension    Hypothyroidism    LBBB (left bundle branch block)    Left patella fracture 2018   "no OR" (02/08/2017)   Major depression in partial remission 02/02/2007   Amitriptyline 25mg  for sleep (stop when runs out of #10 04/2018(, zoloft 100-->150mg --> 200mg    Mild neurocognitive disorder 06/21/2019   Neuropathy ([place]) 11/02/2011   Nocturia    Peripheral vascular disease ([place]) LOWER EXTREMITIES   Spinal stenosis in cervical region 03/21/2010   Squamous cell skin cancer, penis: glans ([place]) 05/2010   Initial excision 11/11; recurrence, excision and laser Rx 9/13    Thrombocytopenia ([place]) 05/23/2012   Urinary frequency 09/05/2009   Possible BPH- see Shawna Orleans notes    Vitamin D deficiency 08/15/2008    Patient Active Problem List   Diagnosis Date Noted   Lumbar stenosis with neurogenic claudication 05/24/2020   Mild cognitive impairment 06/21/2019   Generalized weakness 10/13/2018   Acute blood loss as cause of postoperative anemia 10/26/2017   Delirium 10/24/2017   Hypokalemia 10/24/2017   Pulmonary edema 10/24/2017   Cardiac pacemaker 10/22/2017   Fall 10/21/2017   Fracture of unspecified part of neck of left femur, initial encounter for closed fracture ([place]) 10/21/2017   Complete heart block ([place]) 02/08/2017   BPH (benign prostatic hyperplasia) 05/10/2015   Skin lesion of left lower extremity 03/26/2014   Stasis eczema 03/26/2014   History of skin cancer 03/26/2014   Heart failure with preserved ejection fraction (HFpEF)  12/06/2013   Syncope 08/14/2013   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 06/15/2012   Thrombocytopenia ([place]) 05/23/2012   Neuropathy ([place]) 11/02/2011   CKD (chronic kidney disease) 05/27/2011   Low back pain 04/08/2011   LBBB (left bundle branch block) 05/12/2010   Abnormality of gait 04/23/2010   Spinal stenosis in cervical region 03/21/2010   Constipation 03/19/2010   Urinary frequency 09/05/2009   Hypothyroidism 08/15/2008  Vitamin D deficiency 08/15/2008   Anemia 08/15/2008   Venous stasis 08/15/2008   Hyperlipidemia 02/02/2007   Major depression in full remission ([place]) 02/02/2007   HTN (hypertension) 02/02/2007   Allergic rhinitis 02/02/2007   GERD (gastroesophageal reflux disease) 02/02/2007    Past Surgical History:  Procedure Laterality Date   CIRCUMCISION/ LASER DISSECTION PENILE GLANS CANCER  06-02-2010   CYSTOSCOPY WITH URETHRAL DILATATION  03/28/2012   Procedure: CYSTOSCOPY WITH URETHRAL DILATATION;  Surgeon: Ailene Rud, MD;  Location[facility];  Service: Urology;   Laterality: N/A;  excision biopsy extensive meatal penile carcinoma meatoplasty   EXCISION RIGHT WRIST BENIGN TUMOR  2002 (APPROX)   hip surgery     INCISION AND DRAINAGE DEEP NECK ABSCESS     INGUINAL HERNIA REPAIR  1970's   KNEE ARTHROSCOPY Right 2017   LUMBAR LAMINECTOMY/DECOMPRESSION MICRODISCECTOMY N/A 05/24/2020   Procedure: OPEN LAMINECTOMY LUMBAR [place];  Surgeon: Karsten Ro, DO;  Location: [facility];  Service: Neurosurgery;  Laterality: N/A;   PACEMAKER IMPLANT N/A 02/08/2017   Procedure: Pacemaker Implant;  Surgeon: Deboraha Sprang, MD;  Location: [facility];  Service: Cardiovascular;  Laterality: N/A;   PACEMAKER IMPLANT  02/08/2017   SJM Assurity MRI dual chamber PPM implanted by Dr Caryl Comes for complete heart block   PENILE BX  05-09-2010   TONSILLECTOMY     TRANSURETHRAL RESECTION OF PROSTATE N/A 05/10/2015   Procedure: CYSTOSCOPY, URETHRAL MEATAL DILATION, TRANSURETHRAL RESECTION OF THE PROSTATE (TURP);  Surgeon: Carolan Clines, MD;  [facility];  Service: Urology;  Laterality: N/A;       Home Medications    Prior to Admission medications   Medication Sig Start Date End Date Taking? Authorizing Provider  amLODipine (NORVASC) 2.5 MG tablet TAKE 1 TABLET BY MOUTH DAILY 06/17/21   [name], MD  aspirin 81 MG chewable tablet Chew 81 mg by mouth daily.     [provider]  atorvastatin (LIPITOR) 20 MG tablet TAKE 1 TABLET BY MOUTH DAILY 06/02/21   [name], MD  ferrous sulfate 325 (65 FE) MG tablet Take 325 mg by mouth daily with breakfast.    [provider]  furosemide (LASIX) 20 MG tablet TAKE THREE TABLETS BY MOUTH DAILY 04/28/21   [name], MD  levothyroxine (SYNTHROID) 75 MCG tablet TAKE 1 TABLET BY MOUTH EVERY MORNING 12/26/20   [name], MD  losartan (COZAAR) 50 MG tablet TAKE 1 TABLET BY MOUTH DAILY 12/26/20   [name], MD  sertraline (ZOLOFT) 100 MG tablet Take 2 tablets (200 mg  total) by mouth daily. 10/21/20   [name], MD    Family History Family History  Problem Relation Age of Onset   Lung cancer Mother    Arthritis Other        family hx   Hyperlipidemia Other        family hx   Hypertension Other        family hx   Prostate cancer Other        family hx    Social History Social History   Tobacco Use   Smoking status: Never   Smokeless tobacco: Never  Vaping Use   Vaping Use: Never used  Substance Use Topics   Alcohol use: Yes    Comment: 02/08/2017 "nothing in the 2000s"   Drug use: No     Allergies   Patient has no known allergies.   Review of  Systems Review of Systems  All other systems reviewed and are negative.   Physical Exam Triage Vital Signs ED Triage Vitals  Enc Vitals Group     BP 06/17/21 1608 (!) 148/67     Pulse Rate 06/17/21 1608 68     Resp 06/17/21 1608 18     Temp 06/17/21 1608 97.9 F (36.6 C)     Temp Source 06/17/21 1608 Oral     SpO2 06/17/21 1608 98 %     Weight --      Height --      Head Circumference --      Peak Flow --      Pain Score 06/17/21 1609 5     Pain Loc --      Pain Edu? --      Excl. in [place]? --    No data found.  Updated Vital Signs BP (!) 148/67 (BP Location: Right Arm)   Pulse 68   Temp 97.9 F (36.6 C) (Oral)   Resp 18   SpO2 98%   Visual Acuity Right Eye Distance:   Left Eye Distance:   Bilateral Distance:    Right Eye Near:   Left Eye Near:    Bilateral Near:     Physical Exam Vitals and nursing note reviewed.  Constitutional:      Appearance: He is well-developed.  HENT:     Head: Normocephalic.     Mouth/Throat:     Mouth: Mucous membranes are moist.  Cardiovascular:     Rate and Rhythm: Normal rate.  Pulmonary:     Effort: Pulmonary effort is normal.  Abdominal:     General: There is no distension.  Musculoskeletal:        General: Swelling and tenderness present.     Cervical back: Normal range of motion.     Comments: Red, foot swollen  right lower leg,   Skin:    General: Skin is warm.  Neurological:     Mental Status: He is alert and oriented to person, place, and time.     UC Treatments / Results  Labs (all labs ordered are listed, but only abnormal results are displayed) Labs Reviewed - No data to display  EKG   Radiology No results found.  Procedures Procedures (including critical care time)  Medications Ordered in UC Medications - No data to display  Initial Impression / Assessment and Plan / UC Course  I have reviewed the triage vital signs and the nursing notes.  Pertinent labs & imaging results that were available during my care of the patient were reviewed by me and considered in my medical decision making (see chart for details).     MDM:  Pt incontinent.  Pant soaked in urine.   Pt and neighbor advised pt needs to go to ED for evaluation.   Final Clinical Impressions(s) / UC Diagnoses   Final diagnoses:  Cellulitis of right lower leg  Urinary incontinence, unspecified type   Discharge Instructions   None    ED Prescriptions   None    PDMP not reviewed this encounter.   Fransico Meadow, [place] 06/17/21 1644

## 2021-06-17 NOTE — ED Notes (Signed)
Gave report to [place], RN at [facility] ED.

## 2021-06-17 NOTE — ED Notes (Signed)
Attempted IV x 2 without success. Primary nurse made aware.

## 2021-06-17 NOTE — ED Notes (Signed)
Checked urine canister.

## 2021-06-17 NOTE — Assessment & Plan Note (Signed)
Stable

## 2021-06-17 NOTE — ED Notes (Signed)
ED TO INPATIENT HANDOFF REPORT  ED Nurse Name and Phone #:   S Name/Age/Gender Hunter [name] 87 y.o. male Room/Bed: APA11/APA11  Code Status   Code Status: Full Code  Home/SNF/Other Home Patient oriented to: self, place, time, and situation Is this baseline? Yes   Triage Complete: Triage complete  Chief Complaint Cellulitis of right leg [M01.027]  Triage Note Swelling right lower leg, referral from urgent care   Allergies No Known Allergies  Level of Care/Admitting Diagnosis ED Disposition     ED Disposition  Admit   Condition  --   [place]: [facility] [[id number]]  Level of Care: Med-Surg [16]  Covid Evaluation: Asymptomatic Screening Protocol (No Symptoms)  Diagnosis: Cellulitis of right leg [403474]  Admitting Physician: Bridgett Larsson, [place]  Attending Physician: Bridgett Larsson, [facility] [[id number]]          B Medical/Surgery History Past Medical History:  Diagnosis Date   Allergic rhinitis 02/02/2007   Anemia    Arthritis    "knees" (02/08/2017)   Chronic lower back pain    CKD (chronic kidney disease), stage III ([place]) 05/27/2011   Complete heart block 02/08/2017   Eczema    Essential hypertension 02/02/2007   Lasix 60 mg, losartan 50mg ,  Amlodipine 5mg --> 2.5 mg.  Usually have to repeat BP measurements  In the past-Maxzide 37.5-25mg .   GERD (gastroesophageal reflux disease) occasional   Hearing loss of both ears    Heart failure with preserved ejection fraction (HFpEF) 12/06/2013   History of skin cancer 03/26/2014   nose    Hx of echocardiogram    Echo (07/2013): Mild LVH, EF 25-95%, grade 1 diastolic dysfunction, mild LAE, PASP 23   Hyperlipidemia    Hypertension    Hypothyroidism    LBBB (left bundle branch block)    Left patella fracture 2018   "no OR" (02/08/2017)   Major depression in partial remission 02/02/2007   Amitriptyline 25mg  for sleep (stop when runs out of #10 04/2018(, zoloft 100-->150mg --> 200mg    Mild neurocognitive  disorder 06/21/2019   Neuropathy ([place]) 11/02/2011   Nocturia    Peripheral vascular disease ([place]) LOWER EXTREMITIES   Spinal stenosis in cervical region 03/21/2010   Squamous cell skin cancer, penis: glans ([place]) 05/2010   Initial excision 11/11; recurrence, excision and laser Rx 9/13   Thrombocytopenia ([place]) 05/23/2012   Urinary frequency 09/05/2009   Possible BPH- see Shawna Orleans notes    Vitamin D deficiency 08/15/2008   Past Surgical History:  Procedure Laterality Date   CIRCUMCISION/ LASER DISSECTION PENILE GLANS CANCER  06-02-2010   CYSTOSCOPY WITH URETHRAL DILATATION  03/28/2012   Procedure: CYSTOSCOPY WITH URETHRAL DILATATION;  Surgeon: Ailene Rud, MD;  Location: [place];  Service: Urology;  Laterality: N/A;  excision biopsy extensive meatal penile carcinoma meatoplasty   EXCISION RIGHT WRIST BENIGN TUMOR  2002 (APPROX)   hip surgery     INCISION AND DRAINAGE DEEP NECK ABSCESS     INGUINAL HERNIA REPAIR  1970's   KNEE ARTHROSCOPY Right 2017   LUMBAR LAMINECTOMY/DECOMPRESSION MICRODISCECTOMY N/A 05/24/2020   Procedure: OPEN LAMINECTOMY LUMBAR [place];  Surgeon: Karsten Ro, DO;  Location: [place];  Service: Neurosurgery;  Laterality: N/A;   PACEMAKER IMPLANT N/A 02/08/2017   Procedure: Pacemaker Implant;  Surgeon: Deboraha Sprang, MD;  Location: [employer] [facility];  Service: Cardiovascular;  Laterality: N/A;   PACEMAKER IMPLANT  02/08/2017   SJM Assurity MRI dual chamber PPM implanted by Dr Caryl Comes  for complete heart block   PENILE BX  05-09-2010   TONSILLECTOMY     TRANSURETHRAL RESECTION OF PROSTATE N/A 05/10/2015   Procedure: CYSTOSCOPY, URETHRAL MEATAL DILATION, TRANSURETHRAL RESECTION OF THE PROSTATE (TURP);  Surgeon: Carolan Clines, MD;  Location[facility];  Service: Urology;  Laterality: N/A;     A IV Location/Drains/Wounds Patient Lines/Drains/Airways Status     Active Line/Drains/Airways     Name Placement date Placement time Site Days    Peripheral IV 06/17/21 22 G 1.25" Anterior;Right Forearm 06/17/21  2102  Forearm  less than 1            Intake/Output Last 24 hours No intake or output data in the 24 hours ending 06/17/21 2258  Labs/Imaging Results for orders placed or performed during the hospital encounter of 06/17/21 (from the past 48 hour(s))  CBC with Differential/Platelet     Status: Abnormal   Collection Time: 06/17/21  6:21 PM  Result Value Ref Range   WBC 5.6 4.0 - 10.5 K/uL   RBC 5.05 4.22 - 5.81 MIL/uL   Hemoglobin 13.1 13.0 - 17.0 g/dL   HCT 40.8 39.0 - 52.0 %   MCV 80.8 80.0 - 100.0 fL   MCH 25.9 (L) 26.0 - 34.0 pg   MCHC 32.1 30.0 - 36.0 g/dL   RDW 14.2 11.5 - 15.5 %   Platelets 107 (L) 150 - 400 K/uL    Comment: SPECIMEN CHECKED FOR CLOTS Immature Platelet Fraction may be clinically indicated, consider ordering this additional test [id number] REPEATED TO VERIFY PLATELET COUNT CONFIRMED BY SMEAR    nRBC 0.0 0.0 - 0.2 %   Neutrophils Relative % 60 %   Neutro Abs 3.3 1.7 - 7.7 K/uL   Lymphocytes Relative 31 %   Lymphs Abs 1.7 0.7 - 4.0 K/uL   Monocytes Relative 8 %   Monocytes Absolute 0.5 0.1 - 1.0 K/uL   Eosinophils Relative 1 %   Eosinophils Absolute 0.1 0.0 - 0.5 K/uL   Basophils Relative 0 %   Basophils Absolute 0.0 0.0 - 0.1 K/uL   WBC Morphology MORPHOLOGY UNREMARKABLE    Immature Granulocytes 0 %   Abs Immature Granulocytes 0.02 0.00 - 0.07 K/uL   Polychromasia PRESENT     Comment: Performed at [facility], [address], [place], [place] [address]  Comprehensive metabolic panel     Status: Abnormal   Collection Time: 06/17/21  6:21 PM  Result Value Ref Range   Sodium 137 135 - 145 mmol/L   Potassium 3.5 3.5 - 5.1 mmol/L   Chloride 103 98 - 111 mmol/L   CO2 25 22 - 32 mmol/L   Glucose, Bld 101 (H) 70 - 99 mg/dL    Comment: Glucose reference range applies only to samples taken after fasting for at least 8 hours.   BUN 30 (H) 8 - 23 mg/dL   Creatinine, Ser 1.41 (H) 0.61 -  1.24 mg/dL   Calcium 8.9 8.9 - 10.3 mg/dL   Total Protein 6.2 (L) 6.5 - 8.1 g/dL   Albumin 4.0 3.5 - 5.0 g/dL   AST 23 15 - 41 U/L   ALT 18 0 - 44 U/L   Alkaline Phosphatase 80 38 - 126 U/L   Total Bilirubin 0.4 0.3 - 1.2 mg/dL   GFR, Estimated 49 (L) >60 mL/min    Comment: (NOTE) Calculated using the CKD-EPI Creatinine Equation (2021)    Anion gap 9 5 - 15    Comment: Performed at [facility],  [address], [place], [place] [address]  Urinalysis, Routine w reflex microscopic Urine, Clean Catch     Status: None   Collection Time: 06/17/21  7:55 PM  Result Value Ref Range   Color, Urine YELLOW YELLOW   APPearance CLEAR CLEAR   Specific Gravity, Urine 1.020 1.005 - 1.030   pH 6.0 5.0 - 8.0   Glucose, UA NEGATIVE NEGATIVE mg/dL   Hgb urine dipstick NEGATIVE NEGATIVE   Bilirubin Urine NEGATIVE NEGATIVE   Ketones, ur NEGATIVE NEGATIVE mg/dL   Protein, ur NEGATIVE NEGATIVE mg/dL   Nitrite NEGATIVE NEGATIVE   Leukocytes,Ua NEGATIVE NEGATIVE    Comment: Microscopic not done on urines with negative protein, blood, leukocytes, nitrite, or glucose < 500 mg/dL. Performed at [facility], [address] [place], [place] [address]    DG Chest Port 1 View  Result Date: 06/17/2021 CLINICAL DATA:  Syncope, shortness of breath. EXAM: PORTABLE CHEST 1 VIEW COMPARISON:  August 06, 2020. FINDINGS: Stable cardiomediastinal silhouette. Left-sided pacemaker is unchanged in position. Both lungs are clear. The visualized skeletal structures are unremarkable. IMPRESSION: No active disease. Electronically Signed   By: Marijo Conception M.D.   On: 06/17/2021 18:08    Pending Labs Unresulted Labs (From admission, onward)     Start     Ordered   06/17/21 2221  Resp Panel by RT-PCR (Flu A&B, Covid) Nasopharyngeal Swab  (Tier 2 - Symptomatic/asymptomatic)  Once,   R        06/17/21 2221   06/17/21 2208  Antistreptolysin O titer  Add-on,   AD        06/17/21 2207   Signed and Held  Procalcitonin -  Baseline  ONCE - STAT,   STAT        Signed and Held   Signed and Held  Procalcitonin  Daily,   R      Signed and Held   Signed and Held  CBC with Differential/Platelet  Tomorrow morning,   R        Signed and Held   Signed and Held  Comprehensive metabolic panel  Tomorrow morning,   R        Signed and Held            Vitals/Pain Today's Vitals   06/17/21 2015 06/17/21 2030 06/17/21 2045 06/17/21 2100  BP:  (!) 104/58  (!) 141/97  Pulse: (!) 59 60 64 68  Resp: 20 17 19 17   Temp:      SpO2: 100% 93% 100% 98%    Isolation Precautions No active isolations  Medications Medications  ceFAZolin (ANCEF) IVPB 1 g/50 mL premix (has no administration in time range)  ceFAZolin (ANCEF) IVPB 1 g/50 mL premix (1 g Intravenous New Bag/Given 06/17/21 2104)  sodium chloride 0.9 % bolus 1,000 mL (1,000 mLs Intravenous New Bag/Given 06/17/21 2103)    Mobility walks with person assist Low fall risk   Focused Assessments    R Recommendations:  Report given to:   Additional Notes: pt hard of hearing

## 2021-06-17 NOTE — Assessment & Plan Note (Signed)
Observation bed. IV ancef. Check ASO titer. Check procalcitonin.

## 2021-06-17 NOTE — ED Triage Notes (Signed)
Swelling right lower leg, referral from urgent care

## 2021-06-17 NOTE — H&P (Signed)
History and Physical    Hunter [name] MWN:[id number] DOB: 1934-03-28 DOA: 06/17/2021  PCP: [name], MD   Patient coming from: Home  I have personally briefly reviewed patient's old medical records in [place]  CC: right leg cellulitis HPI: 87 year old male with a history of hypertension, history of cardiac pacemaker, history of cognitive impairment presents the ER today from urgent care.  Patient with a 2-day history of worsening right leg erythema.  Patient reportedly had a fall at home although he does not have any physical signs of any abrasions.  Patient was initially taken to the urgent care by his neighbor.  Patient was sent to the ER by urgent care.  Patient's neighbor is no longer in the ER.  Reportedly the neighbor said the patient was having some visual hallucinations at home.  Patient has a history of cognitive impairment.  Patient denies any fever or chills.  He states that his leg has become red over the last 2 days.  He goes on to give a very tangential history about how he initially sprayed his leg 2 to 3 years ago with bug spray/weed killer that caused a chemical burn on his leg.    In the ER, he was afebrile.  Heart rate 65.  Blood pressure 137/50.  Labs were unremarkable.  White count 5.6.  Sodium 137.  Stable creatinine of 1.4.  Due to presumed acute cellulitis of his right leg, [facility] hospital[facility] contacted for admission.   ED Course: pt afebrile. Labs unremarkable. Cxr negative for pneumonia.  Review of Systems:  Review of Systems  Constitutional: Negative.  Negative for chills, fever and weight loss.  HENT: Negative.  Negative for ear pain, hearing loss and tinnitus.   Eyes: Negative.   Respiratory: Negative.  Negative for cough, hemoptysis and sputum production.   Cardiovascular: Negative.  Negative for chest pain, palpitations and orthopnea.  Gastrointestinal: Negative.  Negative for heartburn, nausea and vomiting.  Genitourinary: Negative.    Musculoskeletal: Negative.   Skin:        Increasing erythema and swelling of right lower leg.  Neurological:        Pt states he fell at home early this morning.  Endo/Heme/Allergies: Negative.   Psychiatric/Behavioral: Negative.    All other systems reviewed and are negative.  Past Medical History:  Diagnosis Date   Allergic rhinitis 02/02/2007   Anemia    Arthritis    "knees" (02/08/2017)   Chronic lower back pain    CKD (chronic kidney disease), stage III ([place]) 05/27/2011   Complete heart block 02/08/2017   Eczema    Essential hypertension 02/02/2007   Lasix 60 mg, losartan 50mg ,  Amlodipine 5mg --> 2.5 mg.  Usually have to repeat BP measurements  In the past-Maxzide 37.5-25mg .   GERD (gastroesophageal reflux disease) occasional   Hearing loss of both ears    Heart failure with preserved ejection fraction (HFpEF) 12/06/2013   History of skin cancer 03/26/2014   nose    Hx of echocardiogram    Echo (07/2013): Mild LVH, EF 40-34%, grade 1 diastolic dysfunction, mild LAE, PASP 23   Hyperlipidemia    Hypertension    Hypothyroidism    LBBB (left bundle branch block)    Left patella fracture 2018   "no OR" (02/08/2017)   Major depression in partial remission 02/02/2007   Amitriptyline 25mg  for sleep (stop when runs out of #10 04/2018(, zoloft 100-->150mg --> 200mg    Mild neurocognitive disorder 06/21/2019   Neuropathy ([place]) 11/02/2011  Nocturia    Peripheral vascular disease ([place]) LOWER EXTREMITIES   Spinal stenosis in cervical region 03/21/2010   Squamous cell skin cancer, penis: glans ([place]) 05/2010   Initial excision 11/11; recurrence, excision and laser Rx 9/13   Thrombocytopenia ([place]) 05/23/2012   Urinary frequency 09/05/2009   Possible BPH- see Shawna Orleans notes    Vitamin D deficiency 08/15/2008    Past Surgical History:  Procedure Laterality Date   CIRCUMCISION/ LASER DISSECTION PENILE GLANS CANCER  06-02-2010   CYSTOSCOPY WITH URETHRAL DILATATION  03/28/2012   Procedure:  CYSTOSCOPY WITH URETHRAL DILATATION;  Surgeon: Ailene Rud, MD;  Location: [facility];  Service: Urology;  Laterality: N/A;  excision biopsy extensive meatal penile carcinoma meatoplasty   EXCISION RIGHT WRIST BENIGN TUMOR  2002 (APPROX)   hip surgery     INCISION AND DRAINAGE DEEP NECK ABSCESS     INGUINAL HERNIA REPAIR  1970's   KNEE ARTHROSCOPY Right 2017   LUMBAR LAMINECTOMY/DECOMPRESSION MICRODISCECTOMY N/A 05/24/2020   Procedure: OPEN LAMINECTOMY LUMBAR [place];  Surgeon: Karsten Ro, DO;  Location: [place];  Service: Neurosurgery;  Laterality: N/A;   PACEMAKER IMPLANT N/A 02/08/2017   Procedure: Pacemaker Implant;  Surgeon: Deboraha Sprang, MD;  Location: [place] [facility];  Service: Cardiovascular;  Laterality: N/A;   PACEMAKER IMPLANT  02/08/2017   SJM Assurity MRI dual chamber PPM implanted by Dr Caryl Comes for complete heart block   PENILE BX  05-09-2010   TONSILLECTOMY     TRANSURETHRAL RESECTION OF PROSTATE N/A 05/10/2015   Procedure: CYSTOSCOPY, URETHRAL MEATAL DILATION, TRANSURETHRAL RESECTION OF THE PROSTATE (TURP);  Surgeon: Carolan Clines, MD;  Location[facility];  Service: Urology;  Laterality: N/A;     reports that he has never smoked. He has never used smokeless tobacco. He reports current alcohol use. He reports that he does not use drugs.  No Known Allergies  Family History  Problem Relation Age of Onset   Lung cancer Mother    Arthritis Other        family hx   Hyperlipidemia Other        family hx   Hypertension Other        family hx   Prostate cancer Other        family hx    Prior to Admission medications   Medication Sig Start Date End Date Taking? Authorizing Provider  amLODipine (NORVASC) 2.5 MG tablet TAKE 1 TABLET BY MOUTH DAILY 06/17/21  Yes [name], MD  aspirin 81 MG chewable tablet Chew 81 mg by mouth daily.    Yes [provider]  atorvastatin (LIPITOR) 20 MG tablet TAKE 1 TABLET BY MOUTH  DAILY 06/02/21  Yes [name], MD  ferrous sulfate 325 (65 FE) MG tablet Take 325 mg by mouth daily with breakfast.   Yes [provider]  furosemide (LASIX) 20 MG tablet TAKE THREE TABLETS BY MOUTH DAILY 04/28/21  Yes [name], MD  levothyroxine (SYNTHROID) 75 MCG tablet TAKE 1 TABLET BY MOUTH EVERY MORNING 12/26/20  Yes [name], MD  losartan (COZAAR) 50 MG tablet TAKE 1 TABLET BY MOUTH DAILY 12/26/20  Yes [name], MD  OVER THE COUNTER MEDICATION Acid Relux   Yes [provider]  sertraline (ZOLOFT) 100 MG tablet Take 2 tablets (200 mg total) by mouth daily. 10/21/20  Yes [name], MD    Physical Exam: Vitals:   06/17/21 2015 06/17/21 2030 06/17/21 2045 06/17/21  2100  BP:  (!) 104/58  (!) 141/97  Pulse: (!) 59 60 64 68  Resp: 20 17 19 17   Temp:      SpO2: 100% 93% 100% 98%    Physical Exam Vitals and nursing note reviewed.  Constitutional:      General: He is not in acute distress.    Appearance: He is not ill-appearing, toxic-appearing or diaphoretic.  HENT:     Head: Normocephalic and atraumatic.     Nose: Nose normal. No rhinorrhea.  Eyes:     General: No scleral icterus.       Right eye: No discharge.        Left eye: No discharge.  Cardiovascular:     Rate and Rhythm: Normal rate and regular rhythm.  Pulmonary:     Effort: Pulmonary effort is normal. No respiratory distress.     Breath sounds: Normal breath sounds. No wheezing or rales.  Abdominal:     General: Abdomen is flat. Bowel sounds are normal. There is no distension.     Palpations: Abdomen is soft.     Tenderness: There is no abdominal tenderness. There is no guarding.  Skin:    Capillary Refill: Capillary refill takes less than 2 seconds.     Findings: Erythema present.     Comments: Right leg erythematous. See pictures. Outline erythema in marking pen.  Neurological:     General: No focal deficit present.     Mental Status: He is alert and  oriented to person, place, and time.     Labs on Admission: I have personally reviewed following labs and imaging studies  CBC: Recent Labs  Lab 06/17/21 1821  WBC 5.6  NEUTROABS 3.3  HGB 13.1  HCT 40.8  MCV 80.8  PLT 161*   Basic Metabolic Panel: Recent Labs  Lab 06/17/21 1821  NA 137  K 3.5  CL 103  CO2 25  GLUCOSE 101*  BUN 30*  CREATININE 1.41*  CALCIUM 8.9   GFR: CrCl cannot be calculated (Unknown ideal weight.). Liver Function Tests: Recent Labs  Lab 06/17/21 1821  AST 23  ALT 18  ALKPHOS 80  BILITOT 0.4  PROT 6.2*  ALBUMIN 4.0   No results for input(s): LIPASE, AMYLASE in the last 168 hours. No results for input(s): AMMONIA in the last 168 hours. Coagulation Profile: No results for input(s): INR, PROTIME in the last 168 hours. Cardiac Enzymes: No results for input(s): CKTOTAL, CKMB, CKMBINDEX, TROPONINI in the last 168 hours. BNP (last 3 results) No results for input(s): PROBNP in the last 8760 hours. HbA1C: No results for input(s): HGBA1C in the last 72 hours. CBG: No results for input(s): GLUCAP in the last 168 hours. Lipid Profile: No results for input(s): CHOL, HDL, LDLCALC, TRIG, CHOLHDL, LDLDIRECT in the last 72 hours. Thyroid Function Tests: No results for input(s): TSH, T4TOTAL, FREET4, T3FREE, THYROIDAB in the last 72 hours. Anemia Panel: No results for input(s): VITAMINB12, FOLATE, FERRITIN, TIBC, IRON, RETICCTPCT in the last 72 hours. Urine analysis:    Component Value Date/Time   COLORURINE YELLOW 06/17/2021 1955   APPEARANCEUR CLEAR 06/17/2021 1955   LABSPEC 1.020 06/17/2021 1955   PHURINE 6.0 06/17/2021 1955   GLUCOSEU NEGATIVE 06/17/2021 1955   GLUCOSEU NEGATIVE 09/16/2006 1452   HGBUR NEGATIVE 06/17/2021 1955   HGBUR negative 09/05/2009 0000   BILIRUBINUR NEGATIVE 06/17/2021 1955   BILIRUBINUR n 05/17/2012 1201   KETONESUR NEGATIVE 06/17/2021 1955   PROTEINUR NEGATIVE 06/17/2021 1955   UROBILINOGEN 0.2  05/17/2012 1201    UROBILINOGEN 0.2 09/05/2009 0000   NITRITE NEGATIVE 06/17/2021 1955   LEUKOCYTESUR NEGATIVE 06/17/2021 1955    Radiological Exams on Admission: I have personally reviewed images DG Chest Port 1 View  Result Date: 06/17/2021 CLINICAL DATA:  Syncope, shortness of breath. EXAM: PORTABLE CHEST 1 VIEW COMPARISON:  August 06, 2020. FINDINGS: Stable cardiomediastinal silhouette. Left-sided pacemaker is unchanged in position. Both lungs are clear. The visualized skeletal structures are unremarkable. IMPRESSION: No active disease. Electronically Signed   By: Marijo Conception M.D.   On: 06/17/2021 18:08    EKG: I have personally reviewed EKG: NSR, IVCD  Assessment/Plan Principal Problem:   Cellulitis of right leg Active Problems:   HTN (hypertension)   Mild cognitive impairment   Cardiac pacemaker    Cellulitis of right leg Observation bed. IV ancef. Check ASO titer. Check procalcitonin.  HTN (hypertension) Stable.  Mild cognitive impairment Reportedly pt brought in by his neighbor. Pt lives alone. Reportedly pt has been having visual hallucinations at home. May need MOCA assessment by OT.  Cardiac pacemaker Stable.  DVT prophylaxis: Lovenox Code Status: Full Code Family Communication: no family at bedside. Pt states his son is a "drug addict"  Disposition Plan: return home  Consults called: none  Admission status: Observation, Med-Surg   Kristopher Oppenheim, DO [facility] 06/17/2021, 9:58 PM

## 2021-06-17 NOTE — ED Triage Notes (Signed)
Right lower leg red since the summer time.  States it is chemical burns from spray he was using in his yard.

## 2021-06-17 NOTE — ED Provider Notes (Addendum)
  Face-to-face evaluation   History: He complains of pain, redness and swelling in the right leg for 1 week.  No known trauma but similar problem previously.  He denies fever, chills, nausea or vomiting.  He was seen earlier today at an urgent care and sent here for evaluation.  Physical exam: Awake, alert and cooperative.  He is extremely hard of hearing.  Right leg has moderate swelling and erythema below the knee.  Neurovascular intact distally in the toes of the right foot.  No clear report of for infection.  Medical screening examination/treatment/procedure(s) were conducted as a shared visit with non-physician practitioner(s) and myself.  I personally evaluated the patient during the encounter     Daleen Bo, MD 06/18/21 1136

## 2021-06-17 NOTE — ED Notes (Signed)
Patient is being discharged from the Urgent Care and sent to the Emergency Department via POV . Per PA patient is in need of higher level of care due to Cellulitis of RLE. Patient is aware and verbalizes understanding of plan of care.  Vitals:   06/17/21 1608  BP: (!) 148/67  Pulse: 68  Resp: 18  Temp: 97.9 F (36.6 C)  SpO2: 98%

## 2021-06-17 NOTE — Assessment & Plan Note (Signed)
Reportedly pt brought in by his neighbor. Pt lives alone. Reportedly pt has been having visual hallucinations at home. May need MOCA assessment by OT.

## 2021-06-17 NOTE — ED Provider Notes (Signed)
[facility] EMERGENCY DEPARTMENT Provider Note   CSN: [id number] Arrival date & time: 06/17/21  1702     History Chief Complaint  Patient presents with   Leg Pain    Hunter [name] is a 87 y.o. male with a history of chronic kidney disease stage III, hypertension, GERD, heart block with pacemaker, hypothyroidism presenting for evaluation of a 2-day history of redness and swelling of his right lower leg.  He denies history of specific trauma to the leg, he does endorse having increasing episodes of transient lightheadedness and has frequent falls, most recently occurring last night.  He states he was sitting at his table when he started to feel lightheaded, he stood up and fell, denying specific injury, states he did not hit his head and had no full LOC although his vision grayed out for a short time he did not lose consciousness.  He denies chest pain or palpitations during these episodes.  He denies worsened shortness of breath coinciding with his leg complaint, but has recognized exercise intolerance which has become worse gradually.  He has had no fevers or chills.  He has had no treatment prior to arrival.  He was initially seen at a urgent care center and was sent here for further management of suspected cellulitis.  Notes indicate that he was incontinent of urine when he presented at the urgent care center.     The history is provided by the patient and a friend.      Past Medical History:  Diagnosis Date   Allergic rhinitis 02/02/2007   Anemia    Arthritis    "knees" (02/08/2017)   Chronic lower back pain    CKD (chronic kidney disease), stage III ([place]) 05/27/2011   Complete heart block 02/08/2017   Eczema    Essential hypertension 02/02/2007   Lasix 60 mg, losartan 50mg ,  Amlodipine 5mg --> 2.5 mg.  Usually have to repeat BP measurements  In the past-Maxzide 37.5-25mg .   GERD (gastroesophageal reflux disease) occasional   Hearing loss of both ears    Heart failure with  preserved ejection fraction (HFpEF) 12/06/2013   History of skin cancer 03/26/2014   nose    Hx of echocardiogram    Echo (07/2013): Mild LVH, EF 61-60%, grade 1 diastolic dysfunction, mild LAE, PASP 23   Hyperlipidemia    Hypertension    Hypothyroidism    LBBB (left bundle branch block)    Left patella fracture 2018   "no OR" (02/08/2017)   Major depression in partial remission 02/02/2007   Amitriptyline 25mg  for sleep (stop when runs out of #10 04/2018(, zoloft 100-->150mg --> 200mg    Mild neurocognitive disorder 06/21/2019   Neuropathy ([place]) 11/02/2011   Nocturia    Peripheral vascular disease ([place]) LOWER EXTREMITIES   Spinal stenosis in cervical region 03/21/2010   Squamous cell skin cancer, penis: glans ([place]) 05/2010   Initial excision 11/11; recurrence, excision and laser Rx 9/13   Thrombocytopenia ([place]) 05/23/2012   Urinary frequency 09/05/2009   Possible BPH- see Shawna [place] notes    Vitamin D deficiency 08/15/2008    Patient Active Problem List   Diagnosis Date Noted   Cellulitis of right leg 06/17/2021   Lumbar stenosis with neurogenic claudication 05/24/2020   Mild cognitive impairment 06/21/2019   Generalized weakness 10/13/2018   Acute blood loss as cause of postoperative anemia 10/26/2017   Delirium 10/24/2017   Hypokalemia 10/24/2017   Pulmonary edema 10/24/2017   Cardiac pacemaker 10/22/2017   Fall 10/21/2017   Fracture  of unspecified part of neck of left femur, initial encounter for closed fracture ([place]) 10/21/2017   Complete heart block ([place]) 02/08/2017   BPH (benign prostatic hyperplasia) 05/10/2015   Skin lesion of left lower extremity 03/26/2014   Stasis eczema 03/26/2014   History of skin cancer 03/26/2014   Heart failure with preserved ejection fraction (HFpEF)  12/06/2013   Syncope 08/14/2013   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 06/15/2012   Thrombocytopenia ([place]) 05/23/2012   Neuropathy ([place]) 11/02/2011   CKD (chronic kidney disease) 05/27/2011    Low back pain 04/08/2011   LBBB (left bundle branch block) 05/12/2010   Abnormality of gait 04/23/2010   Spinal stenosis in cervical region 03/21/2010   Constipation 03/19/2010   Urinary frequency 09/05/2009   Hypothyroidism 08/15/2008   Vitamin D deficiency 08/15/2008   Anemia 08/15/2008   Venous stasis 08/15/2008   Hyperlipidemia 02/02/2007   Major depression in full remission ([place]) 02/02/2007   HTN (hypertension) 02/02/2007   Allergic rhinitis 02/02/2007   GERD (gastroesophageal reflux disease) 02/02/2007    Past Surgical History:  Procedure Laterality Date   CIRCUMCISION/ LASER DISSECTION PENILE GLANS CANCER  06-02-2010   CYSTOSCOPY WITH URETHRAL DILATATION  03/28/2012   Procedure: CYSTOSCOPY WITH URETHRAL DILATATION;  Surgeon: Ailene Rud, MD;  Location: [facility];  Service: Urology;  Laterality: N/A;  excision biopsy extensive meatal penile carcinoma meatoplasty   EXCISION RIGHT WRIST BENIGN TUMOR  2002 (APPROX)   hip surgery     INCISION AND DRAINAGE DEEP NECK ABSCESS     INGUINAL HERNIA REPAIR  1970's   KNEE ARTHROSCOPY Right 2017   LUMBAR LAMINECTOMY/DECOMPRESSION MICRODISCECTOMY N/A 05/24/2020   Procedure: OPEN LAMINECTOMY LUMBAR [place];  Surgeon: Karsten Ro, DO;  Location: [facility];  Service: Neurosurgery;  Laterality: N/A;   PACEMAKER IMPLANT N/A 02/08/2017   Procedure: Pacemaker Implant;  Surgeon: Deboraha Sprang, MD;  Location: [facility];  Service: Cardiovascular;  Laterality: N/A;   PACEMAKER IMPLANT  02/08/2017   SJM Assurity MRI dual chamber PPM implanted by Dr Caryl Comes for complete heart block   PENILE BX  05-09-2010   TONSILLECTOMY     TRANSURETHRAL RESECTION OF PROSTATE N/A 05/10/2015   Procedure: CYSTOSCOPY, URETHRAL MEATAL DILATION, TRANSURETHRAL RESECTION OF THE PROSTATE (TURP);  Surgeon: Carolan Clines, MD;  Location: [facility];  Service: Urology;  Laterality: N/A;       Family History  Problem Relation Age  of Onset   Lung cancer Mother    Arthritis Other        family hx   Hyperlipidemia Other        family hx   Hypertension Other        family hx   Prostate cancer Other        family hx    Social History   Tobacco Use   Smoking status: Never   Smokeless tobacco: Never  Vaping Use   Vaping Use: Never used  Substance Use Topics   Alcohol use: Yes    Comment: 02/08/2017 "nothing in the 2000s"   Drug use: No    Home Medications Prior to Admission medications   Medication Sig Start Date End Date Taking? Authorizing Provider  amLODipine (NORVASC) 2.5 MG tablet TAKE 1 TABLET BY MOUTH DAILY 06/17/21  Yes [name], MD  aspirin 81 MG chewable tablet Chew 81 mg by mouth daily.    Yes [provider]  atorvastatin (LIPITOR) 20 MG tablet TAKE 1  TABLET BY MOUTH DAILY 06/02/21  Yes [name], MD  ferrous sulfate 325 (65 FE) MG tablet Take 325 mg by mouth daily with breakfast.   Yes [provider]  furosemide (LASIX) 20 MG tablet TAKE THREE TABLETS BY MOUTH DAILY 04/28/21  Yes [name], MD  levothyroxine (SYNTHROID) 75 MCG tablet TAKE 1 TABLET BY MOUTH EVERY MORNING 12/26/20  Yes [name], MD  losartan (COZAAR) 50 MG tablet TAKE 1 TABLET BY MOUTH DAILY 12/26/20  Yes [name], MD  OVER THE COUNTER MEDICATION Acid Relux   Yes [provider]  sertraline (ZOLOFT) 100 MG tablet Take 2 tablets (200 mg total) by mouth daily. 10/21/20  Yes [name], MD    Allergies    Patient has no known allergies.  Review of Systems   Review of Systems  Constitutional:  Negative for chills and fever.  HENT:  Negative for congestion and sore throat.   Eyes: Negative.   Respiratory:  Negative for chest tightness and shortness of breath.   Cardiovascular:  Positive for leg swelling. Negative for chest pain and palpitations.  Gastrointestinal:  Negative for abdominal pain and nausea.  Genitourinary:  Positive for enuresis.   Musculoskeletal:  Negative for arthralgias, joint swelling and neck pain.  Skin:  Positive for color change. Negative for rash.  Neurological:  Negative for dizziness, weakness, light-headedness, numbness and headaches.       Near syncope  Psychiatric/Behavioral: Negative.     Physical Exam Updated Vital Signs BP (!) 141/97   Pulse 68   Temp 98 F (36.7 C)   Resp 17   SpO2 98%   Physical Exam Vitals and nursing note reviewed.  Constitutional:      Appearance: He is well-developed. He is not ill-appearing.  HENT:     Head: Normocephalic and atraumatic.     Mouth/Throat:     Mouth: Mucous membranes are moist.  Eyes:     Conjunctiva/sclera: Conjunctivae normal.  Cardiovascular:     Rate and Rhythm: Normal rate and regular rhythm.     Heart sounds: Normal heart sounds.  Pulmonary:     Effort: Pulmonary effort is normal.     Breath sounds: Normal breath sounds. No wheezing.  Abdominal:     General: Bowel sounds are normal.     Palpations: Abdomen is soft.     Tenderness: There is no abdominal tenderness. There is no guarding.  Musculoskeletal:        General: Normal range of motion.     Cervical back: Normal range of motion.  Skin:    General: Skin is warm and dry.  Neurological:     Mental Status: He is alert.    ED Results / Procedures / Treatments   Labs (all labs ordered are listed, but only abnormal results are displayed) Labs Reviewed  CBC WITH DIFFERENTIAL/PLATELET - Abnormal; Notable for the following components:      Result Value   MCH 25.9 (*)    Platelets 107 (*)    All other components within normal limits  COMPREHENSIVE METABOLIC PANEL - Abnormal; Notable for the following components:   Glucose, Bld 101 (*)    BUN 30 (*)    Creatinine, Ser 1.41 (*)    Total Protein 6.2 (*)    GFR, Estimated 49 (*)    All other components within normal limits  URINALYSIS, ROUTINE W REFLEX MICROSCOPIC    EKG None  Radiology DG [facility] 1 [address]  Result  Date: 06/17/2021 CLINICAL DATA:  Syncope, shortness of breath. EXAM: PORTABLE CHEST 1 VIEW COMPARISON:  August 06, 2020. FINDINGS: Stable cardiomediastinal silhouette. Left-sided pacemaker is unchanged in position. Both lungs are clear. The visualized skeletal structures are unremarkable. IMPRESSION: No active disease. Electronically Signed   By: Marijo Conception M.D.   On: 06/17/2021 18:08    Procedures Procedures   Medications Ordered in ED Medications  ceFAZolin (ANCEF) IVPB 1 g/50 mL premix (has no administration in time range)  ceFAZolin (ANCEF) IVPB 1 g/50 mL premix (1 g Intravenous New Bag/Given 06/17/21 2104)  sodium chloride 0.9 % bolus 1,000 mL (1,000 mLs Intravenous New Bag/Given 06/17/21 2103)    ED Course  I have reviewed the triage vital signs and the nursing notes.  Pertinent labs & imaging results that were available during my care of the patient were reviewed by me and considered in my medical decision making (see chart for details).    MDM Rules/Calculators/A&P                           Pt seen by Dr. Bridgett Larsson who agrees with and accepts pt for admission.  Final Clinical Impression(s) / ED Diagnoses Final diagnoses:  Cellulitis of right lower extremity    Rx / DC Orders ED Discharge Orders     None        Landis Martins 06/17/21 2152    Daleen Bo, MD 06/18/21 1137

## 2021-06-17 NOTE — ED Provider Notes (Incomplete Revision)
°  Face-to-face evaluation   History: He complains of pain, redness and swelling in the right leg for 1 week.  No known trauma but similar problem previously.  He denies fever, chills, nausea or vomiting.  He was seen earlier today at an urgent care and sent here for evaluation.  Physical exam: Awake, alert and cooperative.  He is extremely hard of hearing.  Right leg has moderate swelling and erythema below the knee.  Neurovascular intact distally in the toes of the right foot.  No clear report of for infection.  Medical screening examination/treatment/procedure(s) were conducted as a shared visit with non-physician practitioner(s) and myself.  I personally evaluated the patient during the encounter

## 2021-06-17 NOTE — Subjective & Objective (Signed)
CC: right leg cellulitis HPI: 87 year old male with a history of hypertension, history of cardiac pacemaker, history of cognitive impairment presents the ER today from urgent care.  Patient with a 2-day history of worsening right leg erythema.  Patient reportedly had a fall at home although he does not have any physical signs of any abrasions.  Patient was initially taken to the urgent care by his neighbor.  Patient was sent to the ER by urgent care.  Patient's neighbor is no longer in the ER.  Reportedly the neighbor said the patient was having some visual hallucinations at home.  Patient has a history of cognitive impairment.  Patient denies any fever or chills.  He states that his leg has become red over the last 2 days.  He goes on to give a very tangential history about how he initially sprayed his leg 2 to 3 years ago with bug spray/weed killer that caused a chemical burn on his leg.    In the ER, he was afebrile.  Heart rate 65.  Blood pressure 137/50.  Labs were unremarkable.  White count 5.6.  Sodium 137.  Stable creatinine of 1.4.  Due to presumed acute cellulitis of his right leg, [facility] hospitalist contacted for admission.

## 2021-06-18 ENCOUNTER — Ambulatory Visit: Payer: Medicare Other | Admitting: Family

## 2021-06-18 DIAGNOSIS — Z950 Presence of cardiac pacemaker: Secondary | ICD-10-CM | POA: Diagnosis not present

## 2021-06-18 DIAGNOSIS — E669 Obesity, unspecified: Secondary | ICD-10-CM | POA: Diagnosis not present

## 2021-06-18 DIAGNOSIS — I10 Essential (primary) hypertension: Secondary | ICD-10-CM | POA: Diagnosis not present

## 2021-06-18 DIAGNOSIS — L03115 Cellulitis of right lower limb: Secondary | ICD-10-CM | POA: Diagnosis not present

## 2021-06-18 DIAGNOSIS — K219 Gastro-esophageal reflux disease without esophagitis: Secondary | ICD-10-CM

## 2021-06-18 LAB — CBC WITH DIFFERENTIAL/PLATELET
Abs Immature Granulocytes: 0.02 10*3/uL (ref 0.00–0.07)
Basophils Absolute: 0 10*3/uL (ref 0.0–0.1)
Basophils Relative: 1 %
Eosinophils Absolute: 0 10*3/uL (ref 0.0–0.5)
Eosinophils Relative: 1 %
HCT: 36.5 % — ABNORMAL LOW (ref 39.0–52.0)
Hemoglobin: 12 g/dL — ABNORMAL LOW (ref 13.0–17.0)
Immature Granulocytes: 1 %
Lymphocytes Relative: 32 %
Lymphs Abs: 1.3 10*3/uL (ref 0.7–4.0)
MCH: 27.3 pg (ref 26.0–34.0)
MCHC: 32.9 g/dL (ref 30.0–36.0)
MCV: 83 fL (ref 80.0–100.0)
Monocytes Absolute: 0.4 10*3/uL (ref 0.1–1.0)
Monocytes Relative: 9 %
Neutro Abs: 2.2 10*3/uL (ref 1.7–7.7)
Neutrophils Relative %: 56 %
Platelets: 97 10*3/uL — ABNORMAL LOW (ref 150–400)
RBC: 4.4 MIL/uL (ref 4.22–5.81)
RDW: 14.2 % (ref 11.5–15.5)
WBC: 3.9 10*3/uL — ABNORMAL LOW (ref 4.0–10.5)
nRBC: 0 % (ref 0.0–0.2)

## 2021-06-18 LAB — COMPREHENSIVE METABOLIC PANEL
ALT: 17 U/L (ref 0–44)
AST: 21 U/L (ref 15–41)
Albumin: 3.5 g/dL (ref 3.5–5.0)
Alkaline Phosphatase: 76 U/L (ref 38–126)
Anion gap: 8 (ref 5–15)
BUN: 25 mg/dL — ABNORMAL HIGH (ref 8–23)
CO2: 24 mmol/L (ref 22–32)
Calcium: 8.6 mg/dL — ABNORMAL LOW (ref 8.9–10.3)
Chloride: 108 mmol/L (ref 98–111)
Creatinine, Ser: 1.22 mg/dL (ref 0.61–1.24)
GFR, Estimated: 58 mL/min — ABNORMAL LOW (ref >60)
Glucose, Bld: 134 mg/dL — ABNORMAL HIGH (ref 70–99)
Potassium: 3.9 mmol/L (ref 3.5–5.1)
Sodium: 140 mmol/L (ref 135–145)
Total Bilirubin: 0.7 mg/dL (ref 0.3–1.2)
Total Protein: 5.7 g/dL — ABNORMAL LOW (ref 6.5–8.1)

## 2021-06-18 LAB — PROCALCITONIN
Procalcitonin: <0.1 ng/mL
Procalcitonin: <0.1 ng/mL

## 2021-06-18 MED ORDER — DOXYCYCLINE HYCLATE 100 MG PO TABS: 100.0000 mg | ORAL_TABLET | Freq: Two times a day (BID) | ORAL | 0 refills | Status: AC

## 2021-06-18 MED ORDER — PANTOPRAZOLE SODIUM 40 MG PO TBEC: 40.0000 mg | DELAYED_RELEASE_TABLET | Freq: Every day | ORAL | 1 refills | Status: DC

## 2021-06-18 NOTE — Progress Notes (Signed)
Patient suffers from arthritis and heart failure which impairs their ability to perform daily activities like ambulating in the home. A walker will not resolve issue with performing activities of daily living. A wheelchair will allow patient to safely perform daily activities. Patient can safely propel the wheelchair in the home or has a caregiver who can provide assistance.

## 2021-06-18 NOTE — TOC Transition Note (Signed)
Transition of Care [facility]) - CM/SW Discharge Note   Patient Details  Name: Hunter [name] MRN: [id number] Date of Birth: Nov 22, 1933  [facility] of [facility]) CM/SW Contact:  Salome Arnt, LCSW Phone Number: 06/18/2021, 3:28 PM   Clinical Narrative:  Pt d/c today with home health. LCSW discussed with pt who is agreeable to PT and RN. Accepted by Tommi Rumps with [facility]. Pt requesting wheelchair. Agreeable to refer to [facility] to drop ship. MD notified to sign note and put in order. Pt's friend will transport home today.      Final next level of care: Home w Home Health Services Barriers to Discharge: Barriers Resolved   Patient Goals and CMS Choice Patient states their goals for this hospitalization and ongoing recovery are:: return home   Choice offered to / list presented to : Patient  Discharge Placement                    Patient and family notified of of transfer: 06/18/21  Discharge Plan and Services                DME Arranged: Wheelchair manual DME Agency: [employer] Date DME Agency Contacted: 06/18/21 Time DME Agency Contacted: 1527 Representative spoke with at DME Agency: Caryl Pina HH Arranged: RN, PT [facility] Agency: [place] Date [place]: 06/18/21 Time [place]: 1528 Representative spoke with at [place]: [facility] [place] Interventions     Readmission Risk Interventions No flowsheet data found.
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
## 2021-06-18 NOTE — Progress Notes (Signed)
Nsg Discharge Note  Admit Date:  06/17/2021 Discharge date: 06/18/2021   Hunter [name] to be D/C'd Home per MD order.  AVS completed.  Copy for chart, and copy for patient signed, and dated. Patient/caregiver able to verbalize understanding.  Discharge Medication: Allergies as of 06/18/2021   No Known Allergies      Medication List     STOP taking these medications    OVER THE COUNTER MEDICATION       TAKE these medications    amLODipine 2.5 MG tablet Commonly known as: NORVASC TAKE 1 TABLET BY MOUTH DAILY   aspirin 81 MG chewable tablet Chew 81 mg by mouth daily.   atorvastatin 20 MG tablet Commonly known as: LIPITOR TAKE 1 TABLET BY MOUTH DAILY   doxycycline 100 MG tablet Commonly known as: VIBRA-TABS Take 1 tablet (100 mg total) by mouth 2 (two) times daily for 8 days.   ferrous sulfate 325 (65 FE) MG tablet Take 325 mg by mouth daily with breakfast.   furosemide 20 MG tablet Commonly known as: LASIX TAKE THREE TABLETS BY MOUTH DAILY   levothyroxine 75 MCG tablet Commonly known as: SYNTHROID TAKE 1 TABLET BY MOUTH EVERY MORNING   losartan 50 MG tablet Commonly known as: COZAAR TAKE 1 TABLET BY MOUTH DAILY   pantoprazole 40 MG tablet Commonly known as: Protonix Take 1 tablet (40 mg total) by mouth daily.   sertraline 100 MG tablet Commonly known as: ZOLOFT Take 2 tablets (200 mg total) by mouth daily.               Durable Medical Equipment  (From admission, onward)           Start     Ordered   06/18/21 1533  For home use only DME lightweight manual wheelchair with seat cushion  Once       Comments: Patient suffers from venous insufficiency which impairs their ability to perform daily activities like long distance ambulation outside home.  A cane or walker will not resolve  issue with performing activities of daily living. A wheelchair will allow patient to safely perform daily activities. Patient is not able to propel themselves in  the home using a standard weight wheelchair due to endurance. Patient can self propel in the lightweight wheelchair. Length of need 12 months . Accessories: elevating leg rests (ELRs), wheel locks, extensions and anti-tippers.   06/18/21 1534            Discharge Assessment: Vitals:   06/18/21 0858 06/18/21 1447  BP: (!) 141/54 138/60  Pulse: 63 68  Resp:  19  Temp:  97.8 F (36.6 C)  SpO2:  100%   Skin clean, dry and intact without evidence of skin break down, no evidence of skin tears noted. IV catheter discontinued intact. Site without signs and symptoms of complications - no redness or edema noted at insertion site, patient denies c/o pain - only slight tenderness at site.  Dressing with slight pressure applied.  D/c Instructions-Education: Discharge instructions given to patient/family with verbalized understanding. D/c education completed with patient/family including follow up instructions, medication list, d/c activities limitations if indicated, with other d/c instructions as indicated by MD - patient able to verbalize understanding, all questions fully answered. Patient instructed to return to ED, call 911, or call MD for any changes in condition.  Patient escorted via [place], and D/C home via private auto.  Clovis Fredrickson, LPN 40/07/270 5:36 PM

## 2021-06-19 LAB — ANTISTREPTOLYSIN O TITER: ASO: <20 IU/mL (ref 0.0–200.0)

## 2021-06-23 ENCOUNTER — Telehealth: Payer: Self-pay

## 2021-06-23 NOTE — Telephone Encounter (Signed)
Anywhere ok to work him in?

## 2021-06-23 NOTE — Telephone Encounter (Signed)
Talked to [facility], Pt will not be able to come in tomorrow. She stated that due to [place] not having transportation. She stated that transportation has to have a 3 days notice prior to the appt. Can pt do a virtual visit tomorrow or come in a different day?  She also stated that since out of the hospital, Sammy was told that he has to get rid of his dog due to neighbors complaining. She stated that he lives in an apartment by himself and has the dog for 17 years. [name] would like to know if Dr Yong Channel can write a letter that would make Ethin be able to keep the dog.   Please Advise.

## 2021-06-23 NOTE — Telephone Encounter (Signed)
Double book 1120 tomorrow-have him arrive by 11 but tell him since we are working him in that there may be significant delays in seeing him

## 2021-06-23 NOTE — Telephone Encounter (Signed)
10 am on 20th same day slot?

## 2021-06-23 NOTE — Telephone Encounter (Signed)
Pt just got out of the hospital and is needing an Hospital follow up. Dr Yong Channel currently does not have any appts before January. Where should we schedule pt? Please Advise.

## 2021-06-23 NOTE — Telephone Encounter (Signed)
See below

## 2021-06-24 ENCOUNTER — Other Ambulatory Visit: Payer: Self-pay | Admitting: Family Medicine

## 2021-06-24 NOTE — Telephone Encounter (Signed)
Letter has been written, called and made Hunter [name] aware and she will cb with a fax number so that I can fax it to the right person.

## 2021-06-24 NOTE — Telephone Encounter (Signed)
See below

## 2021-06-24 NOTE — Telephone Encounter (Signed)
Talked to [place]. Is there anway that Dr Yong Channel can write a letter or fill forms service forms for Kavari' dog. [name] stated that if does not have the dog out by the end of the week, he may be evicted. Please Advise.
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
## 2021-06-25 NOTE — Telephone Encounter (Addendum)
Received a call from [place], she said she can not find a fax number for [place] to fax the letter. They have a phone number, but they are having trouble getting a hold of anyone. [name] will give [place] a call back with the phone number.

## 2021-06-25 NOTE — Telephone Encounter (Signed)
Letteer printed and faxed to the number below.

## 2021-06-25 NOTE — Telephone Encounter (Addendum)
Update: Hunter [name] has found fax number, [phone]. Attention [employer]

## 2021-06-27 NOTE — Telephone Encounter (Addendum)
Received a call from [place], she is needing to follow up with Bevelyn Ngo about the letter they received . They are asking for a call back in return.

## 2021-07-01 ENCOUNTER — Encounter: Payer: Self-pay | Admitting: Family Medicine

## 2021-07-01 ENCOUNTER — Telehealth: Payer: Self-pay

## 2021-07-01 ENCOUNTER — Ambulatory Visit (INDEPENDENT_AMBULATORY_CARE_PROVIDER_SITE_OTHER): Payer: Medicare Other | Admitting: Family Medicine

## 2021-07-01 VITALS — BP 138/64 | HR 63 | Temp 98.1°F | Ht 71.0 in | Wt 225.4 lb

## 2021-07-01 DIAGNOSIS — Z79899 Other long term (current) drug therapy: Secondary | ICD-10-CM | POA: Diagnosis not present

## 2021-07-01 DIAGNOSIS — I10 Essential (primary) hypertension: Secondary | ICD-10-CM | POA: Diagnosis not present

## 2021-07-01 DIAGNOSIS — I5032 Chronic diastolic (congestive) heart failure: Secondary | ICD-10-CM

## 2021-07-01 DIAGNOSIS — Z09 Encounter for follow-up examination after completed treatment for conditions other than malignant neoplasm: Secondary | ICD-10-CM

## 2021-07-01 DIAGNOSIS — Z23 Encounter for immunization: Secondary | ICD-10-CM

## 2021-07-01 DIAGNOSIS — E785 Hyperlipidemia, unspecified: Secondary | ICD-10-CM

## 2021-07-01 DIAGNOSIS — F3341 Major depressive disorder, recurrent, in partial remission: Secondary | ICD-10-CM

## 2021-07-01 DIAGNOSIS — E039 Hypothyroidism, unspecified: Secondary | ICD-10-CM

## 2021-07-01 DIAGNOSIS — D696 Thrombocytopenia, unspecified: Secondary | ICD-10-CM

## 2021-07-01 DIAGNOSIS — G3184 Mild cognitive impairment, so stated: Secondary | ICD-10-CM

## 2021-07-01 LAB — COMPREHENSIVE METABOLIC PANEL
ALT: 16 U/L (ref 0–53)
AST: 21 U/L (ref 0–37)
Albumin: 4.2 g/dL (ref 3.5–5.2)
Alkaline Phosphatase: 93 U/L (ref 39–117)
BUN: 24 mg/dL — ABNORMAL HIGH (ref 6–23)
CO2: 27 mEq/L (ref 19–32)
Calcium: 9.6 mg/dL (ref 8.4–10.5)
Chloride: 106 mEq/L (ref 96–112)
Creatinine, Ser: 1.22 mg/dL (ref 0.40–1.50)
GFR: 54.15 mL/min — ABNORMAL LOW (ref >60.00)
Glucose, Bld: 115 mg/dL — ABNORMAL HIGH (ref 70–99)
Potassium: 4.1 mEq/L (ref 3.5–5.1)
Sodium: 141 mEq/L (ref 135–145)
Total Bilirubin: 0.5 mg/dL (ref 0.2–1.2)
Total Protein: 6.3 g/dL (ref 6.0–8.3)

## 2021-07-01 LAB — CBC WITH DIFFERENTIAL/PLATELET
Basophils Absolute: 0 10*3/uL (ref 0.0–0.1)
Basophils Relative: 0.3 % (ref 0.0–3.0)
Eosinophils Absolute: 0 10*3/uL (ref 0.0–0.7)
Eosinophils Relative: 0.5 % (ref 0.0–5.0)
HCT: 39.5 % (ref 39.0–52.0)
Hemoglobin: 13.4 g/dL (ref 13.0–17.0)
Lymphocytes Relative: 24.4 % (ref 12.0–46.0)
Lymphs Abs: 1.4 10*3/uL (ref 0.7–4.0)
MCHC: 33.8 g/dL (ref 30.0–36.0)
MCV: 77.2 fl — ABNORMAL LOW (ref 78.0–100.0)
Monocytes Absolute: 0.4 10*3/uL (ref 0.1–1.0)
Monocytes Relative: 6.4 % (ref 3.0–12.0)
Neutro Abs: 4 10*3/uL (ref 1.4–7.7)
Neutrophils Relative %: 68.4 % (ref 43.0–77.0)
Platelets: 104 10*3/uL — ABNORMAL LOW (ref 150.0–400.0)
RBC: 5.12 Mil/uL (ref 4.22–5.81)
RDW: 14.5 % (ref 11.5–15.5)
WBC: 5.9 10*3/uL (ref 4.0–10.5)

## 2021-07-01 LAB — LIPID PANEL
Cholesterol: 116 mg/dL (ref 0–200)
HDL: 31.4 mg/dL — ABNORMAL LOW (ref >39.00)
LDL Cholesterol: 62 mg/dL (ref 0–99)
NonHDL: 84.98
Total CHOL/HDL Ratio: 4
Triglycerides: 117 mg/dL (ref 0.0–149.0)
VLDL: 23.4 mg/dL (ref 0.0–40.0)

## 2021-07-01 LAB — VITAMIN B12: Vitamin B-12: 207 pg/mL — ABNORMAL LOW (ref 211–911)

## 2021-07-01 LAB — TSH: TSH: 2.17 u[IU]/mL (ref 0.35–5.50)
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
## 2021-07-01 NOTE — Addendum Note (Signed)
Addended by: Lynda Rainwater on: 07/01/2021 11:10 AM   Modules accepted: Orders

## 2021-07-01 NOTE — Patient Instructions (Addendum)
Health Maintenance Due  Topic Date Due   COVID-19 Vaccine (3 - Booster for [employer] series) - Recommend getting Omicron/Bivalent booster at your local pharmacy - we do not have this available in the office today. Please let us know when you have received this vaccination.  01/31/2020   INFLUENZA VACCINE  - High dose flu shot today before you leave.  02/10/2021   Please stop by lab before you go If you have mychart- we will send your results within 3 business days of [place] receiving them.  If you do not have mychart- we will call you about results within 5 business days of [place] receiving them.  *please also note that you will see labs on mychart as soon as they post. I will later go in and write notes on them- will say "notes from Dr. Yong Channel"  I will not be making any changes to your blood pressure medications today.  Please continue to elevate your legs at home to assist with your swelling.  Recommended follow up: Return in about 4 months (around 10/30/2021) for a physial or sooner if you needed.
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
## 2021-07-02 ENCOUNTER — Other Ambulatory Visit: Payer: Self-pay

## 2021-07-02 DIAGNOSIS — E538 Deficiency of other specified B group vitamins: Secondary | ICD-10-CM

## 2021-07-02 MED ORDER — VITAMIN B-12 1000 MCG PO TABS
1000.0000 ug | ORAL_TABLET | Freq: Every day | ORAL | 3 refills | Status: DC
Start: 2021-07-02 — End: 2021-07-15

## 2021-07-02 NOTE — Telephone Encounter (Signed)
FYI
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
## 2021-07-02 NOTE — Telephone Encounter (Signed)
Referral is in -thanks so much

## 2021-07-03 ENCOUNTER — Other Ambulatory Visit: Payer: Self-pay

## 2021-07-03 ENCOUNTER — Ambulatory Visit (INDEPENDENT_AMBULATORY_CARE_PROVIDER_SITE_OTHER): Payer: Medicare Other | Admitting: *Deleted

## 2021-07-03 DIAGNOSIS — I10 Essential (primary) hypertension: Secondary | ICD-10-CM

## 2021-07-03 DIAGNOSIS — D696 Thrombocytopenia, unspecified: Secondary | ICD-10-CM

## 2021-07-03 DIAGNOSIS — R269 Unspecified abnormalities of gait and mobility: Secondary | ICD-10-CM

## 2021-07-03 DIAGNOSIS — E66811 Obesity, class 1: Secondary | ICD-10-CM

## 2021-07-03 DIAGNOSIS — I442 Atrioventricular block, complete: Secondary | ICD-10-CM

## 2021-07-03 DIAGNOSIS — G3184 Mild cognitive impairment, so stated: Secondary | ICD-10-CM

## 2021-07-03 DIAGNOSIS — R531 Weakness: Secondary | ICD-10-CM

## 2021-07-03 DIAGNOSIS — E669 Obesity, unspecified: Secondary | ICD-10-CM

## 2021-07-03 DIAGNOSIS — Z79899 Other long term (current) drug therapy: Secondary | ICD-10-CM

## 2021-07-03 DIAGNOSIS — R410 Disorientation, unspecified: Secondary | ICD-10-CM

## 2021-07-03 DIAGNOSIS — E785 Hyperlipidemia, unspecified: Secondary | ICD-10-CM

## 2021-07-03 DIAGNOSIS — Z950 Presence of cardiac pacemaker: Secondary | ICD-10-CM

## 2021-07-03 DIAGNOSIS — I5032 Chronic diastolic (congestive) heart failure: Secondary | ICD-10-CM
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
## 2021-07-10 ENCOUNTER — Telehealth: Payer: Self-pay | Admitting: Family Medicine

## 2021-07-10 NOTE — Telephone Encounter (Signed)
Pt was seen last week and was told he can start doing the b12 shots  Pt friend betty states that they are needing a form/order/script sent it to  [facility]   For them to administer the shot there they would need a script sent in
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
## 2021-07-11 NOTE — Telephone Encounter (Signed)
VM full, unable to reach

## 2021-07-12 DIAGNOSIS — I5032 Chronic diastolic (congestive) heart failure: Secondary | ICD-10-CM

## 2021-07-12 DIAGNOSIS — E785 Hyperlipidemia, unspecified: Secondary | ICD-10-CM | POA: Diagnosis not present

## 2021-07-12 DIAGNOSIS — I10 Essential (primary) hypertension: Secondary | ICD-10-CM

## 2021-07-15 ENCOUNTER — Telehealth: Payer: Self-pay

## 2021-07-15 MED ORDER — CYANOCOBALAMIN 1000 MCG/ML IJ SOLN: INTRAMUSCULAR | 15 refills | Status: DC

## 2021-07-15 NOTE — Telephone Encounter (Signed)
Ok for pt to have done at pharmacy since you did not feel comfortable with pt having needles in the home?

## 2021-07-15 NOTE — Telephone Encounter (Signed)
States Dr. Yong Channel would like for patient to start b12 injections in the office.  States patient has difficulty with travel.  States pharmacy will give injection if script can be sent to [facility].    Please give call back in regard.
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
## 2021-07-20 DIAGNOSIS — M48062 Spinal stenosis, lumbar region with neurogenic claudication: Secondary | ICD-10-CM | POA: Diagnosis not present

## 2021-07-20 DIAGNOSIS — L03115 Cellulitis of right lower limb: Secondary | ICD-10-CM | POA: Diagnosis not present

## 2021-07-23 ENCOUNTER — Ambulatory Visit (INDEPENDENT_AMBULATORY_CARE_PROVIDER_SITE_OTHER): Payer: Medicare Other | Admitting: *Deleted

## 2021-07-23 ENCOUNTER — Other Ambulatory Visit: Payer: Self-pay | Admitting: Family Medicine

## 2021-07-23 DIAGNOSIS — I5032 Chronic diastolic (congestive) heart failure: Secondary | ICD-10-CM

## 2021-07-23 DIAGNOSIS — R410 Disorientation, unspecified: Secondary | ICD-10-CM

## 2021-07-23 DIAGNOSIS — G3184 Mild cognitive impairment, so stated: Secondary | ICD-10-CM

## 2021-07-23 DIAGNOSIS — I10 Essential (primary) hypertension: Secondary | ICD-10-CM

## 2021-07-23 DIAGNOSIS — I442 Atrioventricular block, complete: Secondary | ICD-10-CM

## 2021-07-23 DIAGNOSIS — E785 Hyperlipidemia, unspecified: Secondary | ICD-10-CM

## 2021-07-23 DIAGNOSIS — G629 Polyneuropathy, unspecified: Secondary | ICD-10-CM

## 2021-07-23 DIAGNOSIS — D696 Thrombocytopenia, unspecified: Secondary | ICD-10-CM

## 2021-07-23 DIAGNOSIS — E669 Obesity, unspecified: Secondary | ICD-10-CM

## 2021-07-23 DIAGNOSIS — I878 Other specified disorders of veins: Secondary | ICD-10-CM

## 2021-07-23 DIAGNOSIS — Z950 Presence of cardiac pacemaker: Secondary | ICD-10-CM

## 2021-07-23 DIAGNOSIS — R531 Weakness: Secondary | ICD-10-CM

## 2021-07-23 DIAGNOSIS — M4802 Spinal stenosis, cervical region: Secondary | ICD-10-CM

## 2021-07-23 DIAGNOSIS — R269 Unspecified abnormalities of gait and mobility: Secondary | ICD-10-CM
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
## 2021-07-23 NOTE — Patient Instructions (Signed)
Visit Information  Thank you for taking time to visit with me today. Please don't hesitate to contact me if I can be of assistance to you before our next scheduled telephone appointment.  Following are the goals we discussed today:  Patient Goals/Self-Care Activities: Your friend, [name] will receive bi-weekly calls from LCSW, in an effort to coordinate alternate living arrangements for you, in a higher level of care, before eviction notice expires.   LCSW collaboration with your friend, [name] to request that she provide the owner of the [facility], where you currently reside, with LCSW's contact information, encouraging her to contact LCSW at her earliest convenience. ~Re-emphasized to [name] the importance, as well as urgency, of LCSW being able to make contact with the owner of the [facility], before eviction notice expires, with nowhere to go, and no one to care for you.  LCSW continues to await a return call, as Mrs. Mancel Bale did not feel comfortable providing LCSW with the owners direct contact information. LCSW collaboration with representative from [employer], with the [place] [phone]), to confirm report has been filed and emergency placement is being initiated.  ~Additional HIPAA compliant messages left on voicemail, without a response.  LCSW will continue to make outreach call attempts. Contact LCSW directly (# [id number]) if you have questions, need assistance, or if additional social work needs are identified between now and our next scheduled telephone outreach call. Follow-Up Plan:  LCSW will make contact with patient's friend, [name] on 08/08/2021 at 1:45 pm.   Please call the care guide team at [phone] if you need to cancel or reschedule your appointment.   If you are experiencing a Mental Health or [place] or need someone to talk to, please  call the Suicide and Crisis Lifeline: 988 call the [place] National Suicide Prevention Lifeline: 512-303-6788 or TTY: 901 069 4094 TTY 613-737-8173) to talk to a trained counselor call 1-800-273-TALK (toll free, 24 hour hotline) go to [facility] [address] [place] [phone]) call the [place]: [phone] call 911   Patient verbalizes understanding of instructions and care plan provided today and agrees to view in [place]. Active MyChart status confirmed with patient.    [place] LCSW Licensed Clinical Social Worker [facility] [place] [phone]

## 2021-08-07 ENCOUNTER — Telehealth: Payer: Self-pay | Admitting: *Deleted

## 2021-08-07 ENCOUNTER — Telehealth: Payer: Medicare Other | Admitting: *Deleted

## 2021-08-07 NOTE — Telephone Encounter (Signed)
°  Care Management   Follow Up Note   08/07/2021 Name: Hunter [name] MRN: [id number] DOB: 27-Mar-1934  Referred by: [name], MD  Reason for referral : Chronic Care Management Needs in Patient with Hypertension, Heart Failure with Preserved Ejection Fraction, Complete Heart Block, Chronic Kidney Disease, Mild Cognitive Impairment with Behavioral Disturbance, Cardiac Pacemaker, and Generalized Weakness.  An unsuccessful telephone outreach was attempted today. The patient was referred to the case management team for assistance with care management and care coordination. LCSW was unable to leave a HIPAA compliant message on voicemail for patient, even after several attempts, receiving an automated recording stating that "my call cannot be completed as dialed".  LCSW also attempted to contact patient's friend, Hunter [name] but her "mailbox is full and unable to accept new messages at this time".  LCSW will make a second follow-up telephone outreach call to patient and Hunter [name] within the next 2 weeks, if a return call is not received in the meantime.  Follow-Up Plan:  Request placed with Scheduling Care Guide to reschedule patient's follow-up telephone outreach call with LCSW.  [place] LCSW Licensed Clinical Social Worker [facility] [place] [phone]

## 2021-08-08 ENCOUNTER — Telehealth: Payer: Medicare Other

## 2021-08-12 DIAGNOSIS — I10 Essential (primary) hypertension: Secondary | ICD-10-CM

## 2021-08-12 DIAGNOSIS — E785 Hyperlipidemia, unspecified: Secondary | ICD-10-CM

## 2021-08-12 DIAGNOSIS — I5032 Chronic diastolic (congestive) heart failure: Secondary | ICD-10-CM | POA: Diagnosis not present

## 2021-08-13 ENCOUNTER — Ambulatory Visit (INDEPENDENT_AMBULATORY_CARE_PROVIDER_SITE_OTHER): Payer: Medicare Other | Admitting: *Deleted

## 2021-08-13 ENCOUNTER — Telehealth: Payer: Self-pay

## 2021-08-13 DIAGNOSIS — R410 Disorientation, unspecified: Secondary | ICD-10-CM

## 2021-08-13 DIAGNOSIS — Z950 Presence of cardiac pacemaker: Secondary | ICD-10-CM

## 2021-08-13 DIAGNOSIS — R531 Weakness: Secondary | ICD-10-CM

## 2021-08-13 DIAGNOSIS — D696 Thrombocytopenia, unspecified: Secondary | ICD-10-CM

## 2021-08-13 DIAGNOSIS — E785 Hyperlipidemia, unspecified: Secondary | ICD-10-CM

## 2021-08-13 DIAGNOSIS — R269 Unspecified abnormalities of gait and mobility: Secondary | ICD-10-CM

## 2021-08-13 DIAGNOSIS — E669 Obesity, unspecified: Secondary | ICD-10-CM

## 2021-08-13 DIAGNOSIS — G3184 Mild cognitive impairment, so stated: Secondary | ICD-10-CM

## 2021-08-13 DIAGNOSIS — G629 Polyneuropathy, unspecified: Secondary | ICD-10-CM

## 2021-08-13 DIAGNOSIS — R55 Syncope and collapse: Secondary | ICD-10-CM

## 2021-08-13 DIAGNOSIS — I442 Atrioventricular block, complete: Secondary | ICD-10-CM

## 2021-08-13 DIAGNOSIS — I10 Essential (primary) hypertension: Secondary | ICD-10-CM

## 2021-08-13 NOTE — Patient Instructions (Signed)
Visit Information  Thank you for taking time to visit with me today. Please don't hesitate to contact me if I can be of assistance to you before our next scheduled telephone appointment.  Following are the goals we discussed today:  Patient Goals/Self-Care Activities: LCSW collaboration with Dorann Lodge, Glass blower/designer at the [facility] [phone]) where you currently reside, to confirm all of the following information: ~ You are not being evicted from your independent living apartment at the [facility] where you currently reside. ~ No eviction paperwork has been served, nor are there plans to serve. ~ No Adult Protective Services report has been filed against you with the [place].   LCSW collaboration with Dorann Lodge, Glass blower/designer at the [facility] where you currently reside, to provide contact information for LCSW, encouraging her to notify LCSW directly if she requires assistance with placement for you into a higher level of care, in the near future. LCSW collaboration with your friend, [name] to report findings of conversation with Dorann Lodge, Glass blower/designer at the [facility] where you both reside, in hopes of putting her mind at ease, and relieving her anxiety. LCSW collaboration with your friend, [name] to learn that you are coping well with the recent loss of your dog. LCSW collaboration with representative from [employer], with the [place] [phone]), to confirm that no report has been filed against you.    Contact LCSW directly (# [phone]) if you have questions, need assistance, or if additional social work needs are identified in the near future.   No Follow-Up Required.    Please call the care guide team at [phone] if you need to cancel or reschedule your appointment.   If you are experiencing a Mental  Health or [place] or need someone to talk to, please call the Suicide and Crisis Lifeline: 988 call the [place] National Suicide Prevention Lifeline: 6307975722 or TTY: 406-031-7035 TTY (309)055-6102) to talk to a trained counselor call 1-800-273-TALK (toll free, 24 hour hotline) go to [facility] [address] [place] [phone]) call the [place]: [phone] call 911   Patient verbalizes understanding of instructions and care plan provided today and agrees to view in [place]. Active MyChart status confirmed with patient.    [place] LCSW Licensed Clinical Social Worker [place] [facility] [place] [phone]

## 2021-08-13 NOTE — Telephone Encounter (Signed)
Hunter [name] is needing a call back with Joanna's phone number.

## 2021-08-13 NOTE — Chronic Care Management (AMB) (Signed)
Chronic Care Management    Clinical Social Work Note  08/13/2021 Name: Hunter [name] MRN: [id number] DOB: [birth year]  Hunter [name] is a 88 y.o. year old male who is a primary care patient of Yong Channel, Brayton Mars, MD. The CCM team was consulted to assist the patient with chronic disease management and/or care coordination needs related to: [employer].   Engaged with patient's friend and the office manager where patient currently resides by telephone for follow up visit in response to provider referral for social work chronic care management and care coordination services.   Consent to Services:  The patient was given information about Chronic Care Management services, agreed to services, and gave verbal consent prior to initiation of services.  Please see initial visit note for detailed documentation.   Patient agreed to services and consent obtained.   Assessment: Review of patient past medical history, allergies, medications, and health status, including review of relevant consultants reports was performed today as part of a comprehensive evaluation and provision of chronic care management and care coordination services.     SDOH (Social Determinants of Health) assessments and interventions performed:    Advanced Directives Status: Not addressed in this encounter.  CCM Care Plan  No Known Allergies  Outpatient Encounter Medications as of 08/13/2021  Medication Sig   amLODipine (NORVASC) 2.5 MG tablet TAKE 1 TABLET BY MOUTH DAILY   aspirin 81 MG chewable tablet Chew 81 mg by mouth daily.    atorvastatin (LIPITOR) 20 MG tablet TAKE 1 TABLET BY MOUTH DAILY   cyanocobalamin (,VITAMIN B-12,) 1000 MCG/ML injection 1000 mcg (1 mg) injection once per week for four weeks, followed by 1000 mcg injection once per month. Injections should be done at the pharmacy by pharmacy team due to memory concerns.   ferrous sulfate 325 (65 FE) MG tablet Take 325 mg by mouth daily with breakfast.    furosemide (LASIX) 20 MG tablet TAKE THREE TABLETS BY MOUTH DAILY   levothyroxine (SYNTHROID) 75 MCG tablet TAKE 1 TABLET BY MOUTH EVERY MORNING   losartan (COZAAR) 50 MG tablet TAKE 1 TABLET BY MOUTH DAILY   pantoprazole (PROTONIX) 40 MG tablet Take 1 tablet (40 mg total) by mouth daily.   sertraline (ZOLOFT) 100 MG tablet Take 2 tablets (200 mg total) by mouth daily.   No facility-administered encounter medications on file as of 08/13/2021.    Patient Active Problem List   Diagnosis Date Noted   Class 1 obesity    Cellulitis of right lower extremity 06/17/2021   Lumbar stenosis with neurogenic claudication 05/24/2020   Mild cognitive impairment 06/21/2019   Generalized weakness 10/13/2018   Acute blood loss as cause of postoperative anemia 10/26/2017   Delirium 10/24/2017   Hypokalemia 10/24/2017   Pulmonary edema 10/24/2017   Cardiac pacemaker 10/22/2017   Fall 10/21/2017   Fracture of unspecified part of neck of left femur, initial encounter for closed fracture ([place]) 10/21/2017   Complete heart block ([place]) 02/08/2017   BPH (benign prostatic hyperplasia) 05/10/2015   Skin lesion of left lower extremity 03/26/2014   Stasis eczema 03/26/2014   History of skin cancer 03/26/2014   Heart failure with preserved ejection fraction (HFpEF)  12/06/2013   Syncope 08/14/2013   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 06/15/2012   Thrombocytopenia ([place]) 05/23/2012   Neuropathy ([place]) 11/02/2011   CKD (chronic kidney disease) 05/27/2011   Low back pain 04/08/2011   LBBB (left bundle branch block) 05/12/2010   Abnormality of gait  04/23/2010   Spinal stenosis in cervical region 03/21/2010   Constipation 03/19/2010   Urinary frequency 09/05/2009   Hypothyroidism 08/15/2008   Vitamin D deficiency 08/15/2008   Anemia 08/15/2008   Venous stasis 08/15/2008   Hyperlipidemia 02/02/2007   Major depression in full remission ([place]) 02/02/2007   HTN (hypertension) 02/02/2007   Allergic  rhinitis 02/02/2007   GERD (gastroesophageal reflux disease) 02/02/2007    Conditions to be addressed/monitored: CHF and HTN.  Limited Social Support, Level of Care Concerns, Mental Health Concerns  Social Isolation, Limited Access to Caregiver, Cognitive Deficits, Memory Deficits, and Lacks Knowledge of [employer].  Care Plan : LCSW Plan of Care  Updates made by Francis Gaines, LCSW since 08/13/2021 12:00 AM     Problem: Protect My Health By Moving Into a Higher Level of Care. Resolved 08/13/2021  Priority: High     Goal: Protect My Health By Moving Into a Higher Level of Care. Completed 08/13/2021  Start Date: 07/03/2021  Expected End Date: 08/13/2021  This Visit's Progress: On track  Recent Progress: On track  Priority: High  Note:   Current Barriers:  Chronic Disease Management Support, Education, Advocacy, Resources, Referrals and Case Management Needs in Patient with Hypertension, Heart Failure with Preserved Ejection Fraction, Complete Heart Block, Chronic Kidney Disease, and Mild Cognitive Impairment with Behavioral Disturbance. Inability to consistently perform ADL's/IADL's independently, or administer medications as prescribed. Patient desires to remain at [facility], where he currently resides, but will be served eviction papers soon, due to violence, destruction of property, verbal threats, anger outbursts, harm to other residents, etc. Clinical Goal(s):  Patient and patient's friend will work with LCSW to identify and address any acute and chronic care coordination needs related to the self-health management of Mild Cognitive Impairment with Behavioral Disturbance.    LCSW Interventions:  Inter-disciplinary care team collaboration (see longitudinal plan of care). Collaboration with Primary Care Physician, Dr. Garret Reddish regarding development and update of comprehensive plan of care, as evidenced by provider attestation and co-signature.       Patient  Goals/Self-Care Activities: LCSW collaboration with Dorann Lodge, Glass blower/designer at the [facility] [phone]) where you currently reside, to confirm all of the following information: ~ You are not being evicted from your independent living apartment at the [facility] where you currently reside. ~ No eviction paperwork has been served, nor are there plans to serve. ~ No Adult Protective Services report has been filed against you with the [place].   LCSW collaboration with Dorann Lodge, Glass blower/designer at the [facility] where you currently reside, to provide contact information for LCSW, encouraging her to notify LCSW directly if she requires assistance with placement for you into a higher level of care, in the near future. LCSW collaboration with your friend, [name] to report findings of conversation with Dorann Lodge, Glass blower/designer at the [facility] where you both reside, in hopes of putting her mind at ease, and relieving her anxiety. LCSW collaboration with your friend, [name] to learn that you are coping well with the recent loss of your dog. LCSW collaboration with representative from [employer], with the [place] [phone]), to confirm that no report has been filed against you.    Contact LCSW directly (# [phone]) if you have questions, need assistance, or if additional social work needs are identified in the near future.   No Follow-Up  Required.       [place] LCSW Licensed Clinical Social Worker [place] [facility] [place] [phone]

## 2021-08-14 ENCOUNTER — Telehealth: Payer: Medicare Other

## 2021-08-20 DIAGNOSIS — M48062 Spinal stenosis, lumbar region with neurogenic claudication: Secondary | ICD-10-CM | POA: Diagnosis not present

## 2021-08-20 DIAGNOSIS — L03115 Cellulitis of right lower limb: Secondary | ICD-10-CM | POA: Diagnosis not present

## 2021-09-09 DIAGNOSIS — E785 Hyperlipidemia, unspecified: Secondary | ICD-10-CM

## 2021-09-09 DIAGNOSIS — I10 Essential (primary) hypertension: Secondary | ICD-10-CM

## 2021-09-17 DIAGNOSIS — M48062 Spinal stenosis, lumbar region with neurogenic claudication: Secondary | ICD-10-CM | POA: Diagnosis not present

## 2021-09-17 DIAGNOSIS — L03115 Cellulitis of right lower limb: Secondary | ICD-10-CM | POA: Diagnosis not present

## 2021-10-17 ENCOUNTER — Ambulatory Visit (INDEPENDENT_AMBULATORY_CARE_PROVIDER_SITE_OTHER): Payer: Medicare Other | Admitting: Internal Medicine

## 2021-10-17 ENCOUNTER — Encounter: Payer: Self-pay | Admitting: Internal Medicine

## 2021-10-17 VITALS — BP 144/68 | HR 65 | Ht 71.0 in | Wt 237.2 lb

## 2021-10-17 DIAGNOSIS — I10 Essential (primary) hypertension: Secondary | ICD-10-CM | POA: Diagnosis not present

## 2021-10-17 DIAGNOSIS — I442 Atrioventricular block, complete: Secondary | ICD-10-CM

## 2021-10-17 DIAGNOSIS — I5032 Chronic diastolic (congestive) heart failure: Secondary | ICD-10-CM | POA: Diagnosis not present

## 2021-10-17 LAB — CUP PACEART INCLINIC DEVICE CHECK
Battery Remaining Longevity: 51 mo
Battery Voltage: 2.98 V
Brady Statistic RA Percent Paced: 44 %
Brady Statistic RV Percent Paced: 97 %
Date Time Interrogation Session: 20230407111821
Implantable Lead Implant Date: 20180730
Implantable Lead Implant Date: 20180730
Implantable Lead Location: 753859
Implantable Lead Location: 753860
Implantable Lead Model: 5076
Implantable Lead Model: 5076
Implantable Pulse Generator Implant Date: 20180730
Lead Channel Impedance Value: 412.5 Ohm
Lead Channel Impedance Value: 475 Ohm
Lead Channel Pacing Threshold Amplitude: 0.5 V
Lead Channel Pacing Threshold Amplitude: 0.5 V
Lead Channel Pacing Threshold Amplitude: 1 V
Lead Channel Pacing Threshold Amplitude: 1 V
Lead Channel Pacing Threshold Pulse Width: 0.5 ms
Lead Channel Pacing Threshold Pulse Width: 0.5 ms
Lead Channel Pacing Threshold Pulse Width: 0.5 ms
Lead Channel Pacing Threshold Pulse Width: 0.5 ms
Lead Channel Sensing Intrinsic Amplitude: 12 mV
Lead Channel Sensing Intrinsic Amplitude: 2.1 mV
Lead Channel Setting Pacing Amplitude: 2 V
Lead Channel Setting Pacing Amplitude: 2.5 V
Lead Channel Setting Pacing Pulse Width: 0.5 ms
Lead Channel Setting Sensing Sensitivity: 4 mV
Pulse Gen Model: 2272
Pulse Gen Serial Number: 8930553

## 2021-10-17 NOTE — Patient Instructions (Addendum)
Medication Instructions:  ?Continue all current medications. ? ?Labwork: ?none ? ?Testing/Procedures: ?none ? ?Follow-Up: ?1 year - Dr.  Rayann Heman  ?6 months - routine cardiology  ? ?Any Other Special Instructions Will Be Listed Below (If Applicable). ?2 gm sodium diet info given today  ? ?If you need a refill on your cardiac medications before your next appointment, please call your pharmacy. ? ?

## 2021-10-17 NOTE — Progress Notes (Signed)
? ? ?PCP: [name], MD ?  ?Primary EP:  Dr Rayann Heman ? ?Hunter [name] is a 88 y.o. male who presents today for routine electrophysiology followup.  Since last being seen in our clinic, the patient reports doing reasonably well.  He has stable SOB and edema.  Compliance has been a real issue.  He has very poor diet.  He had cellulitis in December.  Today, he denies symptoms of palpitations, chest pain, dizziness, presyncope, or syncope.  The patient is otherwise without complaint today.  ? ?Past Medical History:  ?Diagnosis Date  ? Allergic rhinitis 02/02/2007  ? Anemia   ? Arthritis   ? "knees" (02/08/2017)  ? Chronic lower back pain   ? CKD (chronic kidney disease), stage III ([place]) 05/27/2011  ? Complete heart block 02/08/2017  ? Eczema   ? Essential hypertension 02/02/2007  ? Lasix 60 mg, losartan '50mg'$ ,  Amlodipine '5mg'$ --> 2.5 mg.  Usually have to repeat BP measurements  In the past-Maxzide 37.5-'25mg'$ .  ? GERD (gastroesophageal reflux disease) occasional  ? Hearing loss of both ears   ? Heart failure with preserved ejection fraction (HFpEF) 12/06/2013  ? History of skin cancer 03/26/2014  ? nose   ? Hx of echocardiogram   ? Echo (07/2013): Mild LVH, EF 07-37%, grade 1 diastolic dysfunction, mild LAE, PASP 23  ? Hyperlipidemia   ? Hypertension   ? Hypothyroidism   ? LBBB (left bundle branch block)   ? Left patella fracture 2018  ? "no OR" (02/08/2017)  ? Major depression in partial remission 02/02/2007  ? Amitriptyline '25mg'$  for sleep (stop when runs out of #10 04/2018(, zoloft 100-->'150mg'$ --> '200mg'$   ? Mild neurocognitive disorder 06/21/2019  ? Neuropathy ([place]) 11/02/2011  ? Nocturia   ? Peripheral vascular disease ([place]) LOWER EXTREMITIES  ? Spinal stenosis in cervical region 03/21/2010  ? Squamous cell skin cancer, penis: glans ([place]) 05/2010  ? Initial excision 11/11; recurrence, excision and laser Rx 9/13  ? Thrombocytopenia ([place]) 05/23/2012  ? Urinary frequency 09/05/2009  ? Possible BPH- see Shawna Orleans notes   ? Vitamin D  deficiency 08/15/2008  ? ?Past Surgical History:  ?Procedure Laterality Date  ? CIRCUMCISION/ LASER DISSECTION PENILE GLANS CANCER  06-02-2010  ? CYSTOSCOPY WITH URETHRAL DILATATION  03/28/2012  ? Procedure: CYSTOSCOPY WITH URETHRAL DILATATION;  Surgeon: Ailene Rud, MD;  Location: [facility];  Service: Urology;  Laterality: N/A;  excision biopsy extensive meatal penile carcinoma meatoplasty  ? EXCISION RIGHT WRIST BENIGN TUMOR  2002 (APPROX)  ? hip surgery    ? INCISION AND DRAINAGE DEEP NECK ABSCESS    ? INGUINAL HERNIA REPAIR  1970's  ? KNEE ARTHROSCOPY Right 2017  ? LUMBAR LAMINECTOMY/DECOMPRESSION MICRODISCECTOMY N/A 05/24/2020  ? Procedure: OPEN LAMINECTOMY LUMBAR [place];  Surgeon: Karsten Ro, DO;  Location: [facility];  Service: Neurosurgery;  Laterality: N/A;  ? PACEMAKER IMPLANT N/A 02/08/2017  ? Procedure: Pacemaker Implant;  Surgeon: Deboraha Sprang, MD;  Location: [facility];  Service: Cardiovascular;  Laterality: N/A;  ? PACEMAKER IMPLANT  02/08/2017  ? SJM Assurity MRI dual chamber PPM implanted by Dr Caryl Comes for complete heart block  ? PENILE BX  05-09-2010  ? TONSILLECTOMY    ? TRANSURETHRAL RESECTION OF PROSTATE N/A 05/10/2015  ? Procedure: CYSTOSCOPY, URETHRAL MEATAL DILATION, TRANSURETHRAL RESECTION OF THE PROSTATE (TURP);  Surgeon: Carolan Clines, MD;  Location: [facility];  Service: Urology;  Laterality: N/A;  ? ? ?ROS- all systems are reviewed and negative  except as per HPI above ? ?Current Outpatient Medications  ?Medication Sig Dispense Refill  ? amLODipine (NORVASC) 2.5 MG tablet TAKE 1 TABLET BY MOUTH DAILY 30 tablet 2  ? aspirin 81 MG chewable tablet Chew 81 mg by mouth daily.     ? atorvastatin (LIPITOR) 20 MG tablet TAKE 1 TABLET BY MOUTH DAILY 90 tablet 3  ? cyanocobalamin (,VITAMIN B-12,) 1000 MCG/ML injection 1000 mcg (1 mg) injection once per week for four weeks, followed by 1000 mcg injection once per month. Injections should be done at the  pharmacy by pharmacy team due to memory concerns. (Patient taking differently: Inject 1,000 mcg into the skin once.) 1 mL 15  ? ferrous sulfate 325 (65 FE) MG tablet Take 325 mg by mouth daily with breakfast.    ? furosemide (LASIX) 20 MG tablet TAKE THREE TABLETS BY MOUTH DAILY 270 tablet 1  ? levothyroxine (SYNTHROID) 75 MCG tablet TAKE 1 TABLET BY MOUTH EVERY MORNING 90 tablet 1  ? losartan (COZAAR) 50 MG tablet TAKE 1 TABLET BY MOUTH DAILY 90 tablet 1  ? pantoprazole (PROTONIX) 40 MG tablet Take 1 tablet (40 mg total) by mouth daily. 30 tablet 1  ? sertraline (ZOLOFT) 100 MG tablet Take 2 tablets (200 mg total) by mouth daily. 180 tablet 3  ? ?No current facility-administered medications for this visit.  ? ? ?Physical Exam: ?Vitals:  ? 10/17/21 1059  ?BP: (!) 144/68  ?Pulse: 65  ?SpO2: 97%  ?Weight: 237 lb 3.2 oz (107.6 kg)  ?Height: '5\' 11"'$  (1.803 m)  ? ? ?GEN- The patient is dissheveled and smells heavily of urine  ?Head- normocephalic, atraumatic ?Eyes-  Sclera clear, conjunctiva pink ?Ears- hearing intact ?Oropharynx- clear ?Lungs- Clear to ausculation bilaterally, normal work of breathing ?Chest- pacemaker pocket is well healed ?Heart- Regular rate and rhythm, no murmurs, rubs or gallops, PMI not laterally displaced ?GI- soft, NT, ND, + BS ?Extremities- no clubbing, cyanosis, + venous stasis changes with moderate edema ? ?Pacemaker interrogation- reviewed in detail today,  See PACEART report ?  ? ?Assessment and Plan: ? ?1. Symptomatic complete heart block ?Normal pacemaker function ?See Claudia Desanctis Art report ?No changes today ?he is device dependant today ? ?2. HTN ?Stable ?No change required today ? ?3. Chronic diastolic dysfunction ?Sodium restriction is advised ? ?Risks, benefits and potential toxicities for medications prescribed and/or refilled reviewed with patient today.  ? ?Follow-up with Dr Harl Bowie in 6 months ?I will see in a year ? ?Hunter Grayer MD, FACC ?10/17/2021 ?11:20 AM ? ? ?

## 2021-10-18 DIAGNOSIS — M48062 Spinal stenosis, lumbar region with neurogenic claudication: Secondary | ICD-10-CM | POA: Diagnosis not present

## 2021-10-18 DIAGNOSIS — L03115 Cellulitis of right lower limb: Secondary | ICD-10-CM | POA: Diagnosis not present

## 2021-11-05 ENCOUNTER — Other Ambulatory Visit: Payer: Self-pay | Admitting: Family Medicine

## 2021-11-05 ENCOUNTER — Telehealth: Payer: Self-pay

## 2021-11-05 NOTE — Telephone Encounter (Signed)
Is requesting a call back.  States there is an issue with patient's housing and needs to speak as soon as possible.

## 2021-11-06 NOTE — Telephone Encounter (Signed)
Please give follow up call ASAP.  Thanks  TP

## 2021-11-07 ENCOUNTER — Telehealth: Payer: Self-pay

## 2021-11-07 NOTE — Telephone Encounter (Signed)
Patient is requesting a letter for reserved parking due to patient having difficulty walking a long distance to his front door.  States he has a handicap parking placard but is unable to get close parking.

## 2021-11-07 NOTE — Telephone Encounter (Signed)
Ok for letter

## 2021-11-10 NOTE — Telephone Encounter (Signed)
Letter has been printed. I reached out to [facility] and no answer and vm not set up, called and lm on pt vm to make aware letter up front for pick up. ?

## 2021-11-10 NOTE — Telephone Encounter (Signed)
To whom it may concern, ? ?Mr. Bonenberger is a primary care patient of mine.  He has several health issues that affect his ability to walk long distances particularly heart failure.  I am requesting that he have a close reserved parking spot close to his home please.  ? ?Thank you for your consideration, ?Garret Reddish ?

## 2021-11-14 ENCOUNTER — Telehealth: Payer: Self-pay

## 2021-11-14 ENCOUNTER — Ambulatory Visit (INDEPENDENT_AMBULATORY_CARE_PROVIDER_SITE_OTHER): Payer: Medicare Other | Admitting: Family Medicine

## 2021-11-14 ENCOUNTER — Encounter: Payer: Self-pay | Admitting: Family Medicine

## 2021-11-14 VITALS — BP 130/50 | HR 80 | Temp 99.0°F | Ht 71.0 in | Wt 235.4 lb

## 2021-11-14 DIAGNOSIS — I10 Essential (primary) hypertension: Secondary | ICD-10-CM

## 2021-11-14 DIAGNOSIS — E538 Deficiency of other specified B group vitamins: Secondary | ICD-10-CM

## 2021-11-14 DIAGNOSIS — E559 Vitamin D deficiency, unspecified: Secondary | ICD-10-CM | POA: Diagnosis not present

## 2021-11-14 DIAGNOSIS — Z0000 Encounter for general adult medical examination without abnormal findings: Secondary | ICD-10-CM

## 2021-11-14 DIAGNOSIS — Z1283 Encounter for screening for malignant neoplasm of skin: Secondary | ICD-10-CM

## 2021-11-14 DIAGNOSIS — H5213 Myopia, bilateral: Secondary | ICD-10-CM

## 2021-11-14 DIAGNOSIS — N189 Chronic kidney disease, unspecified: Secondary | ICD-10-CM | POA: Diagnosis not present

## 2021-11-14 DIAGNOSIS — R718 Other abnormality of red blood cells: Secondary | ICD-10-CM

## 2021-11-14 DIAGNOSIS — I5032 Chronic diastolic (congestive) heart failure: Secondary | ICD-10-CM | POA: Diagnosis not present

## 2021-11-14 DIAGNOSIS — I442 Atrioventricular block, complete: Secondary | ICD-10-CM

## 2021-11-14 DIAGNOSIS — E039 Hypothyroidism, unspecified: Secondary | ICD-10-CM | POA: Diagnosis not present

## 2021-11-14 DIAGNOSIS — E785 Hyperlipidemia, unspecified: Secondary | ICD-10-CM

## 2021-11-14 DIAGNOSIS — F3341 Major depressive disorder, recurrent, in partial remission: Secondary | ICD-10-CM

## 2021-11-14 DIAGNOSIS — D696 Thrombocytopenia, unspecified: Secondary | ICD-10-CM | POA: Diagnosis not present

## 2021-11-14 NOTE — Progress Notes (Signed)
?Phone: [phone] ?  ?Subjective:  ?Patient presents today for their annual physical. Chief complaint-noted.  ? ?See problem oriented charting- ?ROS- full  review of systems was completed and negative  ?except for: near sighted, hard of hearing- otherwise he did not list anything on review of sytems sheet he was given with instructions ? ?The following were reviewed and entered/updated in epic: ?Past Medical History:  ?Diagnosis Date  ? Allergic rhinitis 02/02/2007  ? Anemia   ? Arthritis   ? "knees" (02/08/2017)  ? Chronic lower back pain   ? CKD (chronic kidney disease), stage III ([place]) 05/27/2011  ? Complete heart block 02/08/2017  ? Eczema   ? Essential hypertension 02/02/2007  ? Lasix 60 mg, losartan '50mg'$ ,  Amlodipine '5mg'$ --> 2.5 mg.  Usually have to repeat BP measurements  In the past-Maxzide 37.5-'25mg'$ .  ? GERD (gastroesophageal reflux disease) occasional  ? Hearing loss of both ears   ? Heart failure with preserved ejection fraction (HFpEF) 12/06/2013  ? History of skin cancer 03/26/2014  ? nose   ? Hx of echocardiogram   ? Echo (07/2013): Mild LVH, EF 95-28%, grade 1 diastolic dysfunction, mild LAE, PASP 23  ? Hyperlipidemia   ? Hypertension   ? Hypothyroidism   ? LBBB (left bundle branch block)   ? Left patella fracture 2018  ? "no OR" (02/08/2017)  ? Major depression in partial remission 02/02/2007  ? Amitriptyline '25mg'$  for sleep (stop when runs out of #10 04/2018(, zoloft 100-->'150mg'$ --> '200mg'$   ? Mild neurocognitive disorder 06/21/2019  ? Neuropathy ([place]) 11/02/2011  ? Nocturia   ? Peripheral vascular disease ([place]) LOWER EXTREMITIES  ? Spinal stenosis in cervical region 03/21/2010  ? Squamous cell skin cancer, penis: glans ([place]) 05/2010  ? Initial excision 11/11; recurrence, excision and laser Rx 9/13  ? Thrombocytopenia ([place]) 05/23/2012  ? Urinary frequency 09/05/2009  ? Possible BPH- see Shawna [place] notes   ? Vitamin D deficiency 08/15/2008  ? ?Patient Active Problem List  ? Diagnosis Date Noted  ? Mild cognitive  impairment 06/21/2019  ?  Priority: High  ? Cardiac pacemaker 10/22/2017  ?  Priority: High  ? Complete heart block ([place]) 02/08/2017  ?  Priority: High  ? BPH (benign prostatic hyperplasia) 05/10/2015  ?  Priority: High  ? Heart failure with preserved ejection fraction (HFpEF)  12/06/2013  ?  Priority: High  ? 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  Priority: High  ? Lumbar stenosis with neurogenic claudication 05/24/2020  ?  Priority: Medium   ? Acute blood loss as cause of postoperative anemia 10/26/2017  ?  Priority: Medium   ? Thrombocytopenia ([place]) 05/23/2012  ?  Priority: Medium   ? Neuropathy ([place]) 11/02/2011  ?  Priority: Medium   ? CKD (chronic kidney disease) 05/27/2011  ?  Priority: Medium   ? Hypothyroidism 08/15/2008  ?  Priority: Medium   ? Anemia 08/15/2008  ?  Priority: Medium   ? Venous stasis 08/15/2008  ?  Priority: Medium   ? Hyperlipidemia 02/02/2007  ?  Priority: Medium   ? Major depression in full remission ([place]) 02/02/2007  ?  Priority: Medium   ? HTN (hypertension) 02/02/2007  ?  Priority: Medium   ? Cellulitis of right lower extremity 06/17/2021  ?  Priority: Low  ? Delirium 10/24/2017  ?  Priority: Low  ? Fracture of unspecified part of neck of left femur, initial encounter for closed fracture ([place]) 10/21/2017  ?  Priority: Low  ?  Skin lesion of left lower extremity 03/26/2014  ?  Priority: Low  ? Stasis eczema 03/26/2014  ?  Priority: Low  ? History of skin cancer 03/26/2014  ?  Priority: Low  ? Syncope 08/14/2013  ?  Priority: Low  ? Low back pain 04/08/2011  ?  Priority: Low  ? LBBB (left bundle branch block) 05/12/2010  ?  Priority: Low  ? Abnormality of gait 04/23/2010  ?  Priority: Low  ? Spinal stenosis in cervical region 03/21/2010  ?  Priority: Low  ? Constipation 03/19/2010  ?  Priority: Low  ? Urinary frequency 09/05/2009  ?  Priority: Low  ? Vitamin D deficiency 08/15/2008  ?  Priority: Low  ? Allergic rhinitis 02/02/2007  ?  Priority: Low  ? GERD  (gastroesophageal reflux disease) 02/02/2007  ?  Priority: Low  ? ?Past Surgical History:  ?Procedure Laterality Date  ? CIRCUMCISION/ LASER DISSECTION PENILE GLANS CANCER  06-02-2010  ? CYSTOSCOPY WITH URETHRAL DILATATION  03/28/2012  ? Procedure: CYSTOSCOPY WITH URETHRAL DILATATION;  Surgeon: Ailene Rud, MD;  Location: [facility];  Service: Urology;  Laterality: N/A;  excision biopsy extensive meatal penile carcinoma meatoplasty  ? EXCISION RIGHT WRIST BENIGN TUMOR  2002 (APPROX)  ? hip surgery    ? INCISION AND DRAINAGE DEEP NECK ABSCESS    ? INGUINAL HERNIA REPAIR  1970's  ? KNEE ARTHROSCOPY Right 2017  ? LUMBAR LAMINECTOMY/DECOMPRESSION MICRODISCECTOMY N/A 05/24/2020  ? Procedure: OPEN LAMINECTOMY LUMBAR [place];  Surgeon: Karsten Ro, DO;  Location: [facility];  Service: Neurosurgery;  Laterality: N/A;  ? PACEMAKER IMPLANT N/A 02/08/2017  ? Procedure: Pacemaker Implant;  Surgeon: Deboraha Sprang, MD;  Location: [facility];  Service: Cardiovascular;  Laterality: N/A;  ? PACEMAKER IMPLANT  02/08/2017  ? SJM Assurity MRI dual chamber PPM implanted by Dr Caryl Comes for complete heart block  ? PENILE BX  05-09-2010  ? TONSILLECTOMY    ? TRANSURETHRAL RESECTION OF PROSTATE N/A 05/10/2015  ? Procedure: CYSTOSCOPY, URETHRAL MEATAL DILATION, TRANSURETHRAL RESECTION OF THE PROSTATE (TURP);  Surgeon: Carolan Clines, MD;  Location: [facility];  Service: Urology;  Laterality: N/A;  ? ? ?Family History  ?Problem Relation Age of Onset  ? Lung cancer Mother   ? Arthritis Other   ?     family hx  ? Hyperlipidemia Other   ?     family hx  ? Hypertension Other   ?     family hx  ? Prostate cancer Other   ?     family hx  ? ? ?Medications- reviewed and updated ?Current Outpatient Medications  ?Medication Sig Dispense Refill  ? amLODipine (NORVASC) 2.5 MG tablet TAKE 1 TABLET BY MOUTH DAILY 30 tablet 2  ? aspirin 81 MG chewable tablet Chew 81 mg by mouth daily.     ? atorvastatin (LIPITOR) 20 MG  tablet TAKE 1 TABLET BY MOUTH DAILY 90 tablet 3  ? cyanocobalamin (,VITAMIN B-12,) 1000 MCG/ML injection 1000 mcg (1 mg) injection once per week for four weeks, followed by 1000 mcg injection once per month. Injections should be done at the pharmacy by pharmacy team due to memory concerns. (Patient taking differently: Inject 1,000 mcg into the skin once.) 1 mL 15  ? ferrous sulfate 325 (65 FE) MG tablet Take 325 mg by mouth daily with breakfast.    ? furosemide (LASIX) 20 MG tablet TAKE THREE TABLETS BY MOUTH DAILY 270 tablet 1  ? levothyroxine (SYNTHROID) 75  MCG tablet TAKE 1 TABLET BY MOUTH EVERY MORNING 90 tablet 1  ? losartan (COZAAR) 50 MG tablet TAKE 1 TABLET BY MOUTH DAILY 90 tablet 1  ? pantoprazole (PROTONIX) 40 MG tablet Take 1 tablet (40 mg total) by mouth daily. 30 tablet 1  ? sertraline (ZOLOFT) 100 MG tablet TAKE TWO TABLETS BY MOUTH DAILY 180 tablet 3  ? ?No current facility-administered medications for this visit.  ? ? ?Allergies-reviewed and updated ?No Known Allergies ? ?Social History  ? ?Social History Narrative  ? Lives alone. Has a cat and a dog  ?   ? Ambulates with cane  ?   ? Right handed  ?   ? Former Engineer, maintenance (IT)  ? ?Objective  ?Objective:  ?BP (!) 130/50   Pulse 80   Temp 99 ?F (37.2 ?C)   Ht '5\' 11"'$  (1.803 m)   Wt 235 lb 6.4 oz (106.8 kg)   SpO2 95%   BMI 32.83 kg/m?  ?Gen: NAD, resting comfortably ?HEENT: Mucous membranes are moist. Oropharynx normal ?Neck: no thyromegaly ?CV: RRR no murmurs rubs or gallops ?Lungs: CTAB no crackles, wheeze, rhonchi ?Abdomen: soft/nontender/nondistended/normal bowel sounds. No rebound or guarding.  ?Ext: 1+ edema with venous stasis changes ?Skin: warm, dry ?Neuro: hard of hearing, walks with cane ?  ?Assessment and Plan  ?88 y.o. male presenting for annual physical.  ?Health Maintenance counseling: ?1. Anticipatory guidance: Patient counseled regarding regular dental exams -advised q6 months- doesn't see, eye exams - refer for yearly,  avoiding smoking and  second hand smoke , limiting alcohol to 2 beverages per day, no illicit drugs.   ?2. Risk factor reduction:  Advised patient of need for regular exercise and diet rich and fruits and vegetables to reduce risk of heart attac

## 2021-11-14 NOTE — Telephone Encounter (Signed)
After speaking with the patient I felt that it will be better for him to come into the office for me to help him with his monitor. I asked Hunter [name] from [place] to bring me a cell adapter. ?

## 2021-11-14 NOTE — Addendum Note (Signed)
Addended by: Doran Clay A on: 11/14/2021 04:22 PM ? ? Modules accepted: Orders ? ?

## 2021-11-14 NOTE — Patient Instructions (Addendum)
We will call you within two weeks about your referral to dermatology and eye doctor. If you do not hear within 2 weeks, give [place] a call.   ? ?Please call [phone] to schedule a visit with [facility] ?- please tell the office you were directly referred by Dr. Yong Channel ? ?[place] sometimes can be backed up for up to 2 months so here are some other options to check in on: ?- [facility] [place] [email or web address]  at  [phone] ?-[facility] [email or web address] at [phone] ? ? ?Please stop by lab before you go ?If you have mychart- we will send your results within 3 business days of [place] receiving them.  ?If you do not have mychart- we will call you about results within 5 business days of [place] receiving them.  ?*please also note that you will see labs on mychart as soon as they post. I will later go in and write notes on them- will say "notes from Dr. Yong Channel"  ? ?Recommended follow up: Return in about 4-6 months (around 9-05/17/2022) for follow up or sooner if needed.Schedule b4 you leave. ?

## 2021-11-14 NOTE — Telephone Encounter (Signed)
LMOVM for patient to call device clinic to get help with his home remote monitor. ?

## 2021-11-14 NOTE — Telephone Encounter (Signed)
-----   Message from Simone Curia, RN sent at 11/11/2021 10:39 AM EDT ----- ? ?----- Message ----- ?From: Delfino Lovett T ?Sent: 11/11/2021   9:56 AM EDT ?To: Simone Curia, RN ? ?Good Morning, ? ?Mr. Perales has walked into the office with a box  (transmitter ) He is very confused as to what to do with this.  Can you check and call the patient back?  [phone].  ? ? ?

## 2021-11-15 ENCOUNTER — Other Ambulatory Visit: Payer: Self-pay | Admitting: Family Medicine

## 2021-11-15 LAB — CBC WITH DIFFERENTIAL/PLATELET
Absolute Monocytes: 514 cells/uL (ref 200–950)
Basophils Absolute: 32 cells/uL (ref 0–200)
Basophils Relative: 0.4 %
Eosinophils Absolute: 47 cells/uL (ref 15–500)
Eosinophils Relative: 0.6 %
HCT: 43.6 % (ref 38.5–50.0)
Hemoglobin: 14.7 g/dL (ref 13.2–17.1)
Lymphs Abs: 2433 cells/uL (ref 850–3900)
MCH: 27.4 pg (ref 27.0–33.0)
MCHC: 33.7 g/dL (ref 32.0–36.0)
MCV: 81.3 fL (ref 80.0–100.0)
MPV: 11.7 fL (ref 7.5–12.5)
Monocytes Relative: 6.5 %
Neutro Abs: 4874 cells/uL (ref 1500–7800)
Neutrophils Relative %: 61.7 %
Platelets: 120 10*3/uL — ABNORMAL LOW (ref 140–400)
RBC: 5.36 10*6/uL (ref 4.20–5.80)
RDW: 14.8 % (ref 11.0–15.0)
Total Lymphocyte: 30.8 %
WBC: 7.9 10*3/uL (ref 3.8–10.8)

## 2021-11-15 LAB — COMPREHENSIVE METABOLIC PANEL
AG Ratio: 2 (calc) (ref 1.0–2.5)
ALT: 15 U/L (ref 9–46)
AST: 20 U/L (ref 10–35)
Albumin: 4.5 g/dL (ref 3.6–5.1)
Alkaline phosphatase (APISO): 74 U/L (ref 35–144)
BUN/Creatinine Ratio: 18 (calc) (ref 6–22)
BUN: 27 mg/dL — ABNORMAL HIGH (ref 7–25)
CO2: 22 mmol/L (ref 20–32)
Calcium: 9.5 mg/dL (ref 8.6–10.3)
Chloride: 105 mmol/L (ref 98–110)
Creat: 1.52 mg/dL — ABNORMAL HIGH (ref 0.70–1.22)
Globulin: 2.3 g/dL (calc) (ref 1.9–3.7)
Glucose, Bld: 91 mg/dL (ref 65–99)
Potassium: 4 mmol/L (ref 3.5–5.3)
Sodium: 141 mmol/L (ref 135–146)
Total Bilirubin: 0.9 mg/dL (ref 0.2–1.2)
Total Protein: 6.8 g/dL (ref 6.1–8.1)

## 2021-11-15 LAB — IRON,TIBC AND FERRITIN PANEL
%SAT: 17 % (calc) — ABNORMAL LOW (ref 20–48)
Ferritin: 289 ng/mL (ref 24–380)
Iron: 63 ug/dL (ref 50–180)
TIBC: 373 mcg/dL (calc) (ref 250–425)

## 2021-11-15 LAB — VITAMIN D 25 HYDROXY (VIT D DEFICIENCY, FRACTURES): Vit D, 25-Hydroxy: 27 ng/mL — ABNORMAL LOW (ref 30–100)

## 2021-11-15 LAB — TSH: TSH: 3.7 mIU/L (ref 0.40–4.50)

## 2021-11-15 LAB — VITAMIN B12: Vitamin B-12: 446 pg/mL (ref 200–1100)

## 2021-11-15 MED ORDER — VITAMIN D (ERGOCALCIFEROL) 1.25 MG (50000 UNIT) PO CAPS: 50000.0000 [IU] | ORAL_CAPSULE | ORAL | 0 refills | Status: DC

## 2021-11-17 DIAGNOSIS — L03115 Cellulitis of right lower limb: Secondary | ICD-10-CM | POA: Diagnosis not present

## 2021-11-17 DIAGNOSIS — M48062 Spinal stenosis, lumbar region with neurogenic claudication: Secondary | ICD-10-CM | POA: Diagnosis not present

## 2021-11-17 NOTE — Telephone Encounter (Signed)
Pt called to change his appointment time because he is having car trouble. I rescheduled for Friday at 2 pm. ?

## 2021-11-19 ENCOUNTER — Other Ambulatory Visit: Payer: Self-pay | Admitting: *Deleted

## 2021-11-19 DIAGNOSIS — F3341 Major depressive disorder, recurrent, in partial remission: Secondary | ICD-10-CM

## 2021-11-19 MED ORDER — VITAMIN D (ERGOCALCIFEROL) 1.25 MG (50000 UNIT) PO CAPS
50000.0000 [IU] | ORAL_CAPSULE | ORAL | 0 refills | Status: DC
Start: 2021-11-19 — End: 2022-03-03

## 2021-11-21 ENCOUNTER — Ambulatory Visit: Payer: Medicare Other

## 2021-11-21 DIAGNOSIS — I442 Atrioventricular block, complete: Secondary | ICD-10-CM

## 2021-11-21 NOTE — Progress Notes (Signed)
Patient presented to device clinic appointment to receive assistance with setting up remote monitor with cell adaptor provided today. Unfortunately, per [employer] tech support patients monitor is not compatible with cell adaptor. New monitor ordered and should be delivered to patients home in 10-14 days.  ?

## 2021-11-21 NOTE — Patient Instructions (Signed)
Please await arrival of your new [employer] remote monitor. When it arrives, plug it into the wall in your bed room and plug the cell adaptor that was provided today into the monitor as we demonstrated. If you have any questions or concerns please call the device clinic at [phone]. ?

## 2021-11-27 ENCOUNTER — Telehealth: Payer: Self-pay | Admitting: Family Medicine

## 2021-11-27 NOTE — Telephone Encounter (Signed)
Received call from Hunter [name] regarding Hunter [name]'s pharmacy choices. States Hunter [name] received a call to switch to a [employer] and does not agree with decision.  Spoke with Hunter [name] directly verifying identity with date of birth and address.  Hunter [name] states the company asked him about his address, time living at residence, and names of doctors.  Hunter [name] states it was "[facility]" and "it was a subsidiary of [employer]".  Confirmed with Hunter [name] that he his is ok with using [facility] for one of his prescriptions. States for the others, he will talk with the pharmacy and determine if he would like to continue using them.  Explained to Hunter [name] that we cannot disclose or discuss Hunter [name]'s PHI without a valid DPR on file. She stated understanding.

## 2021-12-18 DIAGNOSIS — L03115 Cellulitis of right lower limb: Secondary | ICD-10-CM | POA: Diagnosis not present

## 2021-12-18 DIAGNOSIS — M48062 Spinal stenosis, lumbar region with neurogenic claudication: Secondary | ICD-10-CM | POA: Diagnosis not present

## 2022-01-05 ENCOUNTER — Other Ambulatory Visit: Payer: Self-pay | Admitting: Family Medicine

## 2022-01-17 DIAGNOSIS — M48062 Spinal stenosis, lumbar region with neurogenic claudication: Secondary | ICD-10-CM | POA: Diagnosis not present

## 2022-01-17 DIAGNOSIS — L03115 Cellulitis of right lower limb: Secondary | ICD-10-CM | POA: Diagnosis not present

## 2022-01-23 DIAGNOSIS — M79674 Pain in right toe(s): Secondary | ICD-10-CM | POA: Diagnosis not present

## 2022-02-01 ENCOUNTER — Other Ambulatory Visit: Payer: Self-pay | Admitting: Family Medicine

## 2022-02-13 DIAGNOSIS — I10 Essential (primary) hypertension: Secondary | ICD-10-CM | POA: Diagnosis not present

## 2022-02-13 DIAGNOSIS — H2512 Age-related nuclear cataract, left eye: Secondary | ICD-10-CM | POA: Diagnosis not present

## 2022-02-13 DIAGNOSIS — H3581 Retinal edema: Secondary | ICD-10-CM | POA: Diagnosis not present

## 2022-02-13 DIAGNOSIS — H2513 Age-related nuclear cataract, bilateral: Secondary | ICD-10-CM | POA: Diagnosis not present

## 2022-02-17 ENCOUNTER — Telehealth: Payer: Self-pay

## 2022-02-17 DIAGNOSIS — L03115 Cellulitis of right lower limb: Secondary | ICD-10-CM | POA: Diagnosis not present

## 2022-02-17 DIAGNOSIS — M48062 Spinal stenosis, lumbar region with neurogenic claudication: Secondary | ICD-10-CM | POA: Diagnosis not present

## 2022-02-17 NOTE — Telephone Encounter (Signed)
   Pre-operative Risk Assessment    Patient Name: Hunter [name]  DOB: 02-Dec-1933 MRN: [id number]      Request for Surgical Clearance    Procedure:   CATARACT EXTRACTION  WITH INTRAOCULAR LENS IMPLANTATION OF LEFT EYE   Date of Surgery:  Clearance 04/23/22                                 Surgeon:  DR. LISA SUN   Surgeon's Group or Practice Name:  [facility]  Phone number:  [phone] Fax number:  [phone]   Type of Clearance Requested:   - Pharmacy:  Hold Aspirin     Type of Anesthesia:   TOPICAL WITH  IV MEDICATION    Additional requests/questions:    SignedJacinta Shoe   02/17/2022, 1:35 PM

## 2022-02-17 NOTE — Telephone Encounter (Signed)
   Patient Name: Hunter [name]  DOB: 12-20-1933 MRN: [id number]  Primary Cardiologist: [name], MD  Chart reviewed as part of pre-operative protocol coverage. Cataract extractions are recognized in guidelines as low risk surgeries that do not typically require specific preoperative testing or holding of blood thinner therapy. Therefore, given past medical history and time since last visit, based on ACC/AHA guidelines, Hunter [name] would be at acceptable risk for the planned procedure without further cardiovascular testing.   I will route this recommendation to the requesting party via Epic fax function and remove from pre-op pool.  Please call with questions. Emmaline Life, NP-C    02/17/2022, 2:52 PM [facility] [address] Office [phone] Fax [phone]

## 2022-02-18 ENCOUNTER — Encounter (INDEPENDENT_AMBULATORY_CARE_PROVIDER_SITE_OTHER): Payer: Self-pay | Admitting: Ophthalmology

## 2022-02-20 ENCOUNTER — Ambulatory Visit: Payer: Medicare Other | Admitting: Family Medicine

## 2022-02-23 ENCOUNTER — Encounter (INDEPENDENT_AMBULATORY_CARE_PROVIDER_SITE_OTHER): Payer: Medicare Other | Admitting: Ophthalmology

## 2022-02-23 ENCOUNTER — Ambulatory Visit (INDEPENDENT_AMBULATORY_CARE_PROVIDER_SITE_OTHER): Payer: Medicare Other | Admitting: Family Medicine

## 2022-02-23 ENCOUNTER — Encounter: Payer: Self-pay | Admitting: Family Medicine

## 2022-02-23 VITALS — BP 130/52 | HR 76 | Temp 97.9°F | Ht 71.0 in | Wt 239.0 lb

## 2022-02-23 DIAGNOSIS — H353211 Exudative age-related macular degeneration, right eye, with active choroidal neovascularization: Secondary | ICD-10-CM | POA: Diagnosis not present

## 2022-02-23 DIAGNOSIS — I10 Essential (primary) hypertension: Secondary | ICD-10-CM

## 2022-02-23 DIAGNOSIS — H35033 Hypertensive retinopathy, bilateral: Secondary | ICD-10-CM | POA: Diagnosis not present

## 2022-02-23 DIAGNOSIS — H353122 Nonexudative age-related macular degeneration, left eye, intermediate dry stage: Secondary | ICD-10-CM | POA: Diagnosis not present

## 2022-02-23 DIAGNOSIS — I5032 Chronic diastolic (congestive) heart failure: Secondary | ICD-10-CM | POA: Diagnosis not present

## 2022-02-23 DIAGNOSIS — H9221 Otorrhagia, right ear: Secondary | ICD-10-CM | POA: Diagnosis not present

## 2022-02-23 DIAGNOSIS — F3341 Major depressive disorder, recurrent, in partial remission: Secondary | ICD-10-CM

## 2022-02-23 DIAGNOSIS — H9390 Unspecified disorder of ear, unspecified ear: Secondary | ICD-10-CM | POA: Diagnosis not present

## 2022-02-23 DIAGNOSIS — L989 Disorder of the skin and subcutaneous tissue, unspecified: Secondary | ICD-10-CM | POA: Diagnosis not present

## 2022-02-23 DIAGNOSIS — H43813 Vitreous degeneration, bilateral: Secondary | ICD-10-CM

## 2022-02-23 NOTE — Progress Notes (Signed)
Phone [phone] In person visit   Subjective:   Hunter [name] is a 88 y.o. 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 06/15/2012    Priority: High   Lumbar stenosis with neurogenic claudication 05/24/2020    Priority: Medium    Acute blood loss as cause of postoperative anemia 10/26/2017    Priority: Medium    Thrombocytopenia ([place]) 05/23/2012    Priority: Medium    Neuropathy ([place]) 11/02/2011    Priority: Medium    CKD (chronic kidney disease) 05/27/2011    Priority: Medium    Hypothyroidism 08/15/2008    Priority: Medium    Anemia 08/15/2008    Priority: Medium    Venous stasis 08/15/2008    Priority: Medium    Hyperlipidemia 02/02/2007    Priority: Medium    Major depression in full remission ([place]) 02/02/2007    Priority: Medium    HTN (hypertension) 02/02/2007    Priority: Medium    Cellulitis of right lower extremity 06/17/2021    Priority: Low   Delirium 10/24/2017    Priority: Low   Fracture of unspecified part of neck of left femur, initial encounter for closed fracture ([place]) 10/21/2017    Priority: Low   Skin lesion of left lower extremity 03/26/2014    Priority: Low   Stasis eczema 03/26/2014    Priority: Low   History of skin cancer 03/26/2014    Priority: Low   Syncope 08/14/2013    Priority: Low   Low back pain 04/08/2011    Priority: Low   LBBB (left bundle branch block) 05/12/2010     Priority: Low   Abnormality of gait 04/23/2010    Priority: Low   Spinal stenosis in cervical region 03/21/2010    Priority: Low   Constipation 03/19/2010    Priority: Low   Urinary frequency 09/05/2009    Priority: Low   Vitamin D deficiency 08/15/2008    Priority: Low   Allergic rhinitis 02/02/2007    Priority: Low   GERD (gastroesophageal reflux disease) 02/02/2007    Priority: Low    Medications- reviewed and updated Current Outpatient Medications  Medication Sig Dispense Refill   amLODipine (NORVASC) 2.5 MG tablet TAKE 1 TABLET BY MOUTH DAILY 30 tablet 2   aspirin 81 MG chewable tablet Chew 81 mg by mouth daily.      atorvastatin (LIPITOR) 20 MG tablet TAKE 1 TABLET BY MOUTH DAILY 90 tablet 3   cyanocobalamin (,VITAMIN B-12,) 1000 MCG/ML injection 1000 mcg (1 mg) injection once per week for four weeks, followed by 1000 mcg injection once per month. Injections should be done at the pharmacy by pharmacy team due to memory concerns. (Patient taking differently: Inject 1,000 mcg into the skin once.) 1 mL 15   ferrous sulfate 325 (65 FE) MG tablet Take 325 mg by mouth daily with breakfast.     furosemide (LASIX) 20 MG  tablet TAKE THREE TABLETS BY MOUTH DAILY 270 tablet 1   levothyroxine (SYNTHROID) 75 MCG tablet TAKE 1 TABLET BY MOUTH EVERY MORNING 90 tablet 1   losartan (COZAAR) 50 MG tablet TAKE 1 TABLET BY MOUTH DAILY 90 tablet 1   pantoprazole (PROTONIX) 40 MG tablet Take 1 tablet (40 mg total) by mouth daily. 30 tablet 1   sertraline (ZOLOFT) 100 MG tablet TAKE TWO TABLETS BY MOUTH DAILY 180 tablet 3   Vitamin D, Ergocalciferol, (DRISDOL) 1.25 MG (50000 UNIT) CAPS capsule Take 1 capsule (50,000 Units total) by mouth every 7 (seven) days. 12 capsule 0   No current facility-administered medications for this visit.     Objective:  BP (!) 130/52   Pulse 76   Temp 97.9 F (36.6 C)   Ht '5\' 11"'$  (1.803 m)   Wt 239 lb (108.4 kg)   SpO2 96%   BMI 33.33 kg/m  Gen: NAD, resting  comfortably Left ear canal and tympanic membrane normal.  Right tympanic membrane with superficial abrasion at the top of the ear-some dried blood here as well as some mixed dried blood with cerumen deeper in the right ear canal CV: Normal heart rate  Lungs: CTAB no crackles, wheeze, rhonchi Ext: 1+ edema Skin: warm, dry, horned lesion over antihelix of right ear, 1 x 1 cm raised flesh-colored lesion on dorsum of the hand-possible basal cell skin cancer     Assessment and Plan   #Retinal concerns-sees retinal specialist-patient with history of retinal issues and has ophthalmology visit later today-our office called their office and they were kind enough to move his appointment back 30 minutes so he could make both visits  #Mild neurocognitive disorder diagnosed by neuropsychologist-he has a friend from his apartment complex supporting him today-very grateful for her presence  #Skin concerns S: Patient was referred to dermatology about 3 months ago-he needs someone specifically in [place] or [place] and requested repeat referral-at that time had mainly been concerned on multiple AK's A/P: Today I see a horned lesion on patient's right ear as well as a lesion on his left hand about 1 x 1 cm-I am concerned about skin cancer in both instances-we will place new referral to dermatology and specifically request [place] or    #Hearing aid issues/blood in ear S: Patient finally got hearing aids-the left one has been working very well.  On the right side when he tries to place the hearing aid he has noted some irritation and subsequently noted some bleeding-he has stopped using the hearing aid in that ear.  Audiologist was out of town this week A/P: Blood in right ear canal noted-superficial abrasion just at the entry point of the ear-I believe he has scratched this open with the hearing aid-reports subsequently using Q-tip-recommended stopping this and following up with audiology.  Seems to have  some dried blood mixed with wax deep in the ear-he will hold off on further intervention and avoid Q-tips-May need ENT intervention   #Bladder issues-patient with continued issues with frequency and incontinence.  He has tried several medications through urology and reports there is another when he may try-I encouraged him to follow-up with urology  #Heart failure with preserved ejection fraction #Hypertension S: Medication:Amlodipine 2.5 mg, Lasix 20 mg, losartan 50 mg  Edema: Stable Weight gain: Slight increase from last visit up 4 pounds Shortness of breath: Did not report increase A/P: Hypertension is well controlled-continue current medications-do not think amlodipine is ideal with CHF but with good control blood  pressure and reasonable control of CHF we opted to continue current medication CHF-slight increase in weight but overall otherwise appears euvolemic-continue to monitor      # Depression S: Medication:Zoloft 200 mg -situational stress- hearing, vision issues, not treated best at apartment    02/23/2022   11:27 AM 11/14/2021    3:04 PM 07/06/2021   10:00 PM  Depression screen PHQ 2/9  Decreased Interest 3 2 0  Down, Depressed, Hopeless 3 3 0  PHQ - 2 Score 6 5 0  Altered sleeping 3 1   Tired, decreased energy 0 0   Change in appetite 0 0   Feeling bad or failure about yourself  1 0   Trouble concentrating 0 0   Moving slowly or fidgety/restless 0 0   Suicidal thoughts 0 0   PHQ-9 Score 10 6   Difficult doing work/chores Somewhat difficult Not difficult at all   A/P: Patient reports outside of situational stress he actually feels pretty good overall but his ongoing issues with hearing, vision and not being treated well at his apartment are stressful for him-he is interested in finding a therapist closer to him as options with in [place] were not feasible-he will let us know if he finds a location he would like to try.  For now we will consider this depression in  partial remission-he would like to continue current medications  Recommended follow up: Return in about 4 months (around 06/25/2022) for followup or sooner if needed.Schedule b4 you leave. Future Appointments  Date Time Provider [place]  03/23/2022  1:45 PM Hayden Pedro, MD [facility] None  04/06/2022  8:55 AM [facility] DEVICE REMOTES [facility]  04/17/2022  1:00 PM Arnoldo Lenis, MD [facility]  06/22/2022  2:20 PM [name], MD [facility]  10/05/2022  8:55 AM [facility] DEVICE REMOTES [facility]  10/16/2022 11:00 AM Allred, Jeneen Rinks, MD [facility]    Lab/Order associations:   ICD-10-CM   1. Ear lesion  H93.90 Ambulatory referral to Dermatology    2. Skin lesion  L98.9 Ambulatory referral to Dermatology    3. Blood in right ear canal  H92.21     4. Chronic heart failure with preserved ejection fraction (HCC)  I50.32     5. Primary hypertension  I10     6. Recurrent major depressive disorder, in partial remission ([place])  F33.41      Return precautions advised.  Garret Reddish, MD

## 2022-02-23 NOTE — Patient Instructions (Addendum)
Flu shot- we should have these available within a month or two but please let us know if you get at outside pharmacy  We will call you within two weeks about your referral to dermatology. If you do not hear within 2 weeks, give [place] a call.   When you run out of high dose vitamin D once a week- start taking 1000 units daily  Call your local therapist back and see if you can get plugged back in  Recommended follow up: Return in about 4 months (around 06/25/2022) for followup or sooner if needed.Schedule b4 you leave.

## 2022-02-27 ENCOUNTER — Other Ambulatory Visit: Payer: Self-pay | Admitting: Family Medicine

## 2022-03-20 DIAGNOSIS — M48062 Spinal stenosis, lumbar region with neurogenic claudication: Secondary | ICD-10-CM | POA: Diagnosis not present

## 2022-03-20 DIAGNOSIS — L03115 Cellulitis of right lower limb: Secondary | ICD-10-CM | POA: Diagnosis not present

## 2022-03-23 ENCOUNTER — Encounter (INDEPENDENT_AMBULATORY_CARE_PROVIDER_SITE_OTHER): Payer: Medicare Other | Admitting: Ophthalmology

## 2022-03-30 ENCOUNTER — Encounter (INDEPENDENT_AMBULATORY_CARE_PROVIDER_SITE_OTHER): Payer: Medicare Other | Admitting: Ophthalmology

## 2022-03-30 DIAGNOSIS — H353211 Exudative age-related macular degeneration, right eye, with active choroidal neovascularization: Secondary | ICD-10-CM | POA: Diagnosis not present

## 2022-03-30 DIAGNOSIS — H43813 Vitreous degeneration, bilateral: Secondary | ICD-10-CM | POA: Diagnosis not present

## 2022-03-30 DIAGNOSIS — H353122 Nonexudative age-related macular degeneration, left eye, intermediate dry stage: Secondary | ICD-10-CM | POA: Diagnosis not present

## 2022-03-30 DIAGNOSIS — I10 Essential (primary) hypertension: Secondary | ICD-10-CM | POA: Diagnosis not present

## 2022-03-30 DIAGNOSIS — H35033 Hypertensive retinopathy, bilateral: Secondary | ICD-10-CM | POA: Diagnosis not present

## 2022-04-17 ENCOUNTER — Encounter: Payer: Self-pay | Admitting: Cardiology

## 2022-04-17 ENCOUNTER — Other Ambulatory Visit (HOSPITAL_COMMUNITY)
Admission: RE | Admit: 2022-04-17 | Discharge: 2022-04-17 | Disposition: A | Discharge status: Home / Self Care | Payer: Medicare Other | Source: Ambulatory Visit | Attending: Cardiology | Admitting: Cardiology

## 2022-04-17 ENCOUNTER — Ambulatory Visit: Payer: Medicare Other | Attending: Cardiology | Admitting: Cardiology

## 2022-04-17 VITALS — BP 136/70 | HR 79 | Ht 71.0 in | Wt 243.0 lb

## 2022-04-17 DIAGNOSIS — Z79899 Other long term (current) drug therapy: Secondary | ICD-10-CM

## 2022-04-17 DIAGNOSIS — I5032 Chronic diastolic (congestive) heart failure: Secondary | ICD-10-CM | POA: Insufficient documentation

## 2022-04-17 DIAGNOSIS — I890 Lymphedema, not elsewhere classified: Secondary | ICD-10-CM

## 2022-04-17 LAB — BASIC METABOLIC PANEL
Anion gap: 7 (ref 5–15)
BUN: 23 mg/dL (ref 8–23)
CO2: 24 mmol/L (ref 22–32)
Calcium: 9.1 mg/dL (ref 8.9–10.3)
Chloride: 109 mmol/L (ref 98–111)
Creatinine, Ser: 1.23 mg/dL (ref 0.61–1.24)
GFR, Estimated: 57 mL/min — ABNORMAL LOW (ref >60)
Glucose, Bld: 104 mg/dL — ABNORMAL HIGH (ref 70–99)
Potassium: 4.3 mmol/L (ref 3.5–5.1)
Sodium: 140 mmol/L (ref 135–145)

## 2022-04-17 LAB — MAGNESIUM: Magnesium: 2.3 mg/dL (ref 1.7–2.4)

## 2022-04-17 NOTE — Progress Notes (Signed)
Clinical Summary Hunter [name] is a 88 y.o.male seen today for follow up of the following medical problems. Last seen by PA Leonides Sake, this is our first visit together   1.HFpEF -05/2018 echo LVEF 50%, grade I dd - some oinging SOB has progressed - some ongoing LE edema.  -ongoing poor compliance with lasix, takes about 2-3 times a week.  - up 6 lbs since 10/2021   2. Complete heart block/pacemaker - followed by EP 10/2021 device check, normal function.    3. Chronic LBBB    4. CKD 3  5. HTN - compliant with meds   6. Hyperlipidemia - 06/2021 TC 116 HDL 31 TG 117 LDL 62   Past Medical History:  Diagnosis Date   Allergic rhinitis 02/02/2007   Anemia    Arthritis    "knees" (02/08/2017)   Chronic lower back pain    CKD (chronic kidney disease), stage III ([place]) 05/27/2011   Complete heart block 02/08/2017   Eczema    Essential hypertension 02/02/2007   Lasix 60 mg, losartan '50mg'$ ,  Amlodipine '5mg'$ --> 2.5 mg.  Usually have to repeat BP measurements  In the past-Maxzide 37.5-'25mg'$ .   GERD (gastroesophageal reflux disease) occasional   Hearing loss of both ears    Heart failure with preserved ejection fraction (HFpEF) 12/06/2013   History of skin cancer 03/26/2014   nose    Hx of echocardiogram    Echo (07/2013): Mild LVH, EF 41-32%, grade 1 diastolic dysfunction, mild LAE, PASP 23   Hyperlipidemia    Hypertension    Hypothyroidism    LBBB (left bundle Karron Goens block)    Left patella fracture 2018   "no OR" (02/08/2017)   Major depression in partial remission 02/02/2007   Amitriptyline '25mg'$  for sleep (stop when runs out of #10 04/2018(, zoloft 100-->'150mg'$ --> '200mg'$    Mild neurocognitive disorder 06/21/2019   Neuropathy ([place]) 11/02/2011   Nocturia    Peripheral vascular disease ([place]) LOWER EXTREMITIES   Spinal stenosis in cervical region 03/21/2010   Squamous cell skin cancer, penis: glans ([place]) 05/2010   Initial excision 11/11; recurrence, excision and laser Rx 9/13    Thrombocytopenia ([place]) 05/23/2012   Urinary frequency 09/05/2009   Possible BPH- see Shawna Orleans notes    Vitamin D deficiency 08/15/2008     No Known Allergies   Current Outpatient Medications  Medication Sig Dispense Refill   amLODipine (NORVASC) 2.5 MG tablet TAKE 1 TABLET BY MOUTH DAILY 30 tablet 2   aspirin 81 MG chewable tablet Chew 81 mg by mouth daily.      atorvastatin (LIPITOR) 20 MG tablet TAKE 1 TABLET BY MOUTH DAILY 90 tablet 3   cyanocobalamin (,VITAMIN B-12,) 1000 MCG/ML injection 1000 mcg (1 mg) injection once per week for four weeks, followed by 1000 mcg injection once per month. Injections should be done at the pharmacy by pharmacy team due to memory concerns. (Patient taking differently: Inject 1,000 mcg into the skin once.) 1 mL 15   ferrous sulfate 325 (65 FE) MG tablet Take 325 mg by mouth daily with breakfast.     furosemide (LASIX) 20 MG tablet TAKE THREE TABLETS BY MOUTH DAILY 270 tablet 1   levothyroxine (SYNTHROID) 75 MCG tablet TAKE 1 TABLET BY MOUTH EVERY MORNING 90 tablet 1   losartan (COZAAR) 50 MG tablet TAKE 1 TABLET BY MOUTH DAILY 90 tablet 1   pantoprazole (PROTONIX) 40 MG tablet Take 1 tablet (40 mg total) by mouth daily. 30 tablet 1  sertraline (ZOLOFT) 100 MG tablet TAKE TWO TABLETS BY MOUTH DAILY 180 tablet 3   Vitamin D, Ergocalciferol, (DRISDOL) 1.25 MG (50000 UNIT) CAPS capsule TAKE 1 CAPSULE BY MOUTH EVERY 7  DAYS 12 capsule 3   No current facility-administered medications for this visit.     Past Surgical History:  Procedure Laterality Date   CIRCUMCISION/ LASER DISSECTION PENILE GLANS CANCER  06-02-2010   CYSTOSCOPY WITH URETHRAL DILATATION  03/28/2012   Procedure: CYSTOSCOPY WITH URETHRAL DILATATION;  Surgeon: Ailene Rud, MD;  Location: [facility];  Service: Urology;  Laterality: N/A;  excision biopsy extensive meatal penile carcinoma meatoplasty   EXCISION RIGHT WRIST BENIGN TUMOR  2002 (APPROX)   hip surgery      INCISION AND DRAINAGE DEEP NECK ABSCESS     INGUINAL HERNIA REPAIR  1970's   KNEE ARTHROSCOPY Right 2017   LUMBAR LAMINECTOMY/DECOMPRESSION MICRODISCECTOMY N/A 05/24/2020   Procedure: OPEN LAMINECTOMY LUMBAR [place];  Surgeon: Karsten Ro, DO;  Location: [facility];  Service: Neurosurgery;  Laterality: N/A;   PACEMAKER IMPLANT N/A 02/08/2017   Procedure: Pacemaker Implant;  Surgeon: Deboraha Sprang, MD;  Location: [facility];  Service: Cardiovascular;  Laterality: N/A;   PACEMAKER IMPLANT  02/08/2017   SJM Assurity MRI dual chamber PPM implanted by Dr Caryl Comes for complete heart block   PENILE BX  05-09-2010   TONSILLECTOMY     TRANSURETHRAL RESECTION OF PROSTATE N/A 05/10/2015   Procedure: CYSTOSCOPY, URETHRAL MEATAL DILATION, TRANSURETHRAL RESECTION OF THE PROSTATE (TURP);  Surgeon: Carolan Clines, MD;  Location: [facility];  Service: Urology;  Laterality: N/A;     No Known Allergies    Family History  Problem Relation Age of Onset   Lung cancer Mother    Arthritis Other        family hx   Hyperlipidemia Other        family hx   Hypertension Other        family hx   Prostate cancer Other        family hx     Social History Hunter [name] reports that he has never smoked. He has never been exposed to tobacco smoke. He has never used smokeless tobacco. Hunter [name] reports current alcohol use.   Review of Systems CONSTITUTIONAL: No weight loss, fever, chills, weakness or fatigue.  HEENT: Eyes: No visual loss, blurred vision, double vision or yellow sclerae.No hearing loss, sneezing, congestion, runny nose or sore throat.  SKIN: No rash or itching.  CARDIOVASCULAR: per hpi RESPIRATORY: No shortness of breath, cough or sputum.  GASTROINTESTINAL: No anorexia, nausea, vomiting or diarrhea. No abdominal pain or blood.  GENITOURINARY: No burning on urination, no polyuria NEUROLOGICAL: No headache, dizziness, syncope, paralysis, ataxia, numbness or tingling in the  extremities. No change in bowel or bladder control.  MUSCULOSKELETAL: No muscle, back pain, joint pain or stiffness.  LYMPHATICS: No enlarged nodes. No history of splenectomy.  PSYCHIATRIC: No history of depression or anxiety.  ENDOCRINOLOGIC: No reports of sweating, cold or heat intolerance. No polyuria or polydipsia.  Marland Kitchen   Physical Examination Today's Vitals   04/17/22 1258  BP: 136/70  Pulse: 79  SpO2: 93%  Weight: 243 lb (110.2 kg)  Height: '5\' 11"'$  (1.803 m)   Body mass index is 33.89 kg/m.  Gen: resting comfortably, no acute distress HEENT: no scleral icterus, pupils equal round and reactive, no palptable cervical adenopathy,  CV: RRR, no m/r/g no jvd Resp: Clear to auscultation bilaterally GI:  abdomen is soft, non-tender, non-distended, normal bowel sounds, no hepatosplenomegaly MSK: extremities are warm, no edema.  Skin: warm, no rash Neuro:  no focal deficits Psych: appropriate affect   Diagnostic Studies 05/2018 echo Study Conclusions   - Left ventricle: The cavity size was normal. Wall thickness was    normal. The estimated ejection fraction was 50%. Septal motion    suggestive of LBBB or RV pacing. Doppler parameters are    consistent with abnormal left ventricular relaxation (grade 1    diastolic dysfunction).  - Aortic valve: Mildly calcified annulus. Trileaflet.  - Mitral valve: Mildly calcified annulus. There was mild    regurgitation.  - Right ventricle: Pacer wire or catheter noted in right ventricle.  - Right atrium: Central venous pressure (est): 3 mm Hg.  - Atrial septum: No defect or patent foramen ovale was identified.  - Tricuspid valve: There was trivial regurgitation.  - Pulmonary arteries: PA peak pressure: 24 mm Hg (S).  - Pericardium, extracardiac: There was no pericardial effusion.     Assessment and Plan   1.HFpEF - some signs of fluid overload, encouraged better compliance with lasix - check bmet/mg  2. Lymphedema - leg swelling  also looks to have a component of lymphedema, refer to lypmhedema clinic   3.HTN At goal, continue current meds    Hunter [name], M.D.,

## 2022-04-17 NOTE — Patient Instructions (Addendum)
Medication Instructions:  Continue all current medications.    Labwork: BMET, Mg - orders given today  Office will contact with results via phone, letter or mychart.     Testing/Procedures: none  Follow-Up: 6 months   Any Other Special Instructions Will Be Listed Below (If Applicable). You have been referred to Lymphedema clinic  Message has been sent to pacemaker clinic.  If you need a refill on your cardiac medications before your next appointment, please call your pharmacy.

## 2022-04-20 ENCOUNTER — Encounter: Payer: Self-pay | Admitting: *Deleted

## 2022-04-22 ENCOUNTER — Ambulatory Visit (INDEPENDENT_AMBULATORY_CARE_PROVIDER_SITE_OTHER): Payer: Medicare Other

## 2022-04-22 ENCOUNTER — Telehealth: Payer: Self-pay

## 2022-04-22 DIAGNOSIS — I442 Atrioventricular block, complete: Secondary | ICD-10-CM | POA: Diagnosis not present

## 2022-04-22 LAB — CUP PACEART REMOTE DEVICE CHECK
Battery Remaining Longevity: 46 mo
Battery Remaining Percentage: 45 %
Battery Voltage: 2.96 V
Brady Statistic AP VP Percent: 48 %
Brady Statistic AP VS Percent: 1 %
Brady Statistic AS VP Percent: 49 %
Brady Statistic AS VS Percent: 1 %
Brady Statistic RA Percent Paced: 46 %
Brady Statistic RV Percent Paced: 98 %
Date Time Interrogation Session: 20231011110426
Implantable Lead Implant Date: 20180730
Implantable Lead Implant Date: 20180730
Implantable Lead Location: 753859
Implantable Lead Location: 753860
Implantable Lead Model: 5076
Implantable Lead Model: 5076
Implantable Pulse Generator Implant Date: 20180730
Lead Channel Impedance Value: 400 Ohm
Lead Channel Impedance Value: 480 Ohm
Lead Channel Pacing Threshold Amplitude: 0.5 V
Lead Channel Pacing Threshold Amplitude: 1 V
Lead Channel Pacing Threshold Pulse Width: 0.5 ms
Lead Channel Pacing Threshold Pulse Width: 0.5 ms
Lead Channel Sensing Intrinsic Amplitude: 10.1 mV
Lead Channel Sensing Intrinsic Amplitude: 2.3 mV
Lead Channel Setting Pacing Amplitude: 2 V
Lead Channel Setting Pacing Amplitude: 2.5 V
Lead Channel Setting Pacing Pulse Width: 0.5 ms
Lead Channel Setting Sensing Sensitivity: 4 mV
Pulse Gen Model: 2272
Pulse Gen Serial Number: 8930553

## 2022-04-22 NOTE — Telephone Encounter (Signed)
Transmission received, normal device function, 3.8 years of battery life.   Presenting Ap/Vp, VP: 98% of the time.

## 2022-04-22 NOTE — Telephone Encounter (Signed)
I helped the patient send his transmission. Transmission received. He needs the results to be sent to Dr. Harl Bowie because he has surgery tomorrow.

## 2022-04-23 DIAGNOSIS — H2512 Age-related nuclear cataract, left eye: Secondary | ICD-10-CM | POA: Diagnosis not present

## 2022-04-23 NOTE — Telephone Encounter (Signed)
Normal device check  Zandra Abts MD

## 2022-04-27 ENCOUNTER — Encounter (INDEPENDENT_AMBULATORY_CARE_PROVIDER_SITE_OTHER): Payer: Medicare Other | Admitting: Ophthalmology

## 2022-04-27 ENCOUNTER — Telehealth: Payer: Self-pay | Admitting: *Deleted

## 2022-04-27 DIAGNOSIS — H18222 Idiopathic corneal edema, left eye: Secondary | ICD-10-CM | POA: Diagnosis not present

## 2022-04-27 DIAGNOSIS — H43813 Vitreous degeneration, bilateral: Secondary | ICD-10-CM

## 2022-04-27 DIAGNOSIS — I10 Essential (primary) hypertension: Secondary | ICD-10-CM

## 2022-04-27 DIAGNOSIS — H35031 Hypertensive retinopathy, right eye: Secondary | ICD-10-CM

## 2022-04-27 DIAGNOSIS — H2511 Age-related nuclear cataract, right eye: Secondary | ICD-10-CM | POA: Diagnosis not present

## 2022-04-27 DIAGNOSIS — H353211 Exudative age-related macular degeneration, right eye, with active choroidal neovascularization: Secondary | ICD-10-CM | POA: Diagnosis not present

## 2022-04-27 NOTE — Patient Outreach (Signed)
  Care Coordination   04/27/2022 Name: Hunter [name] MRN: [id number] DOB: [birth year]   Care Coordination Outreach Attempts:  An unsuccessful telephone outreach was attempted today to offer the patient information about available care coordination services as a benefit of their health plan.   Follow Up Plan:  Additional outreach attempts will be made to offer the patient care coordination information and services.   Encounter Outcome:  No Answer  Care Coordination Interventions Activated:  No   Care Coordination Interventions:  No, not indicated    Raina Mina, RN Care Management Coordinator [place] Office [phone]

## 2022-05-05 ENCOUNTER — Telehealth: Payer: Self-pay | Admitting: *Deleted

## 2022-05-05 ENCOUNTER — Encounter: Payer: Self-pay | Admitting: *Deleted

## 2022-05-05 ENCOUNTER — Telehealth: Payer: Self-pay

## 2022-05-05 DIAGNOSIS — I10 Essential (primary) hypertension: Secondary | ICD-10-CM

## 2022-05-05 NOTE — Telephone Encounter (Signed)
   Telephone encounter was:  Successful.  05/05/2022 Name: Hunter [name] MRN: [id number] DOB: 1934-04-22  Hunter [name] is a 88 y.o. year old male who is a primary care patient of Yong Channel, Brayton Mars, MD . The community resource team was consulted for assistance with Transportation Needs   Care guide performed the following interventions: Spoke to patient and friend/caregiver Hunter [name] about [employer].  Patient is aware of his transportation benefit with [employer], but says they are unreliable and don't show up when scheduled. Emailed Hunter [name] to inquire if we can assist with transportation since this patient has a transportation benefit.  Follow Up Plan:   I will call the patient to update.  [place] Resource Care Guide   ??[email or web address]  ?? [phone]   Website: [email or web address]  [email or web address]

## 2022-05-05 NOTE — Progress Notes (Signed)
Remote pacemaker transmission.   

## 2022-05-05 NOTE — Patient Instructions (Signed)
Visit Information  Thank you for taking time to visit with me today. Please don't hesitate to contact me if I can be of assistance to you.   Following are the goals we discussed today:   Goals Addressed               This Visit's Progress     COMPLETED: Transportation/motor wheelchair (pt-stated)        Care Coordination Interventions: Advised patient to scheduled his AWV with his primary provider Reviewed medications with patient and discussed adherence and verified no needed refills Collaborated with Dr. Yong Channel regarding the request for a motor wheelchair Reviewed scheduled/upcoming provider appointments including pending appointments Care Guide referral for transportation resources Assessed social determinant of health barriers          Please call the care guide team at [phone] if you need to cancel or reschedule your appointment.   If you are experiencing a Mental Health or [place] or need someone to talk to, please call the Suicide and Crisis Lifeline: 988  The patient verbalized understanding of instructions, educational materials, and care plan provided today and DECLINED offer to receive copy of patient instructions, educational materials, and care plan.   No further follow up required: No further needs  Raina Mina, RN Care Management Coordinator [place] Office [phone]

## 2022-05-05 NOTE — Patient Outreach (Signed)
  Care Coordination   Initial Visit Note   05/05/2022 Name: AHARON CARRIERE MRN: [id number] DOB: January 09, 1934  CHON BUHL is a 88 y.o. year old male who sees Yong Channel, Brayton Mars, MD for primary care. I  spoke with [name] (Friend).  What matters to the patients health and wellness today?  Transportation/motor wheelchair    Goals Addressed               This Visit's Progress     COMPLETED: Transportation/motor wheelchair (pt-stated)        Care Coordination Interventions: Advised patient to scheduled his AWV with his primary provider Reviewed medications with patient and discussed adherence and verified no needed refills Collaborated with Dr. Yong Channel regarding the request for a motor wheelchair Reviewed scheduled/upcoming provider appointments including pending appointments Care Guide referral for transportation resources Assessed social determinant of health barriers          SDOH assessments and interventions completed:  Yes  SDOH Interventions Today    Flowsheet Row Most Recent Value  SDOH Interventions   Food Insecurity Interventions Intervention Not Indicated  Housing Interventions Intervention Not Indicated  Transportation Interventions Ambulatory [id number] Order  [Referred to careguide]  Utilities Interventions Intervention Not Indicated        Care Coordination Interventions Activated:  Yes  Care Coordination Interventions:  Yes, provided   Follow up plan: No further intervention required.   Encounter Outcome:  Pt. Visit Completed   Raina Mina, RN Care Management Coordinator [place] Office [phone]

## 2022-05-06 ENCOUNTER — Other Ambulatory Visit: Payer: Self-pay | Admitting: Family Medicine

## 2022-05-07 ENCOUNTER — Telehealth: Payer: Self-pay

## 2022-05-07 NOTE — Telephone Encounter (Signed)
   Telephone encounter was:  Unsuccessful.  05/07/2022 Name: Hunter [name] MRN: [id number] DOB: Feb 01, 1934  Unsuccessful outbound call made today to assist with:  Transportation Needs   Outreach Attempt:  2nd Attempt  A HIPAA compliant voice message was left requesting a return call.  Instructed patient to call back at [phone].  Left message on mobile voicemail for patient to return my call regarding transportation. Home number is not in service.  Received email message from [facility] giving permission to schedule transportation for this patient.  [place] Resource Care Guide   ??[email or web address]  ?? [phone]   Website: [email or web address]  [email or web address]

## 2022-05-11 ENCOUNTER — Telehealth: Payer: Self-pay

## 2022-05-11 NOTE — Telephone Encounter (Signed)
   Telephone encounter was:  Unsuccessful.  05/11/2022 Name: Hunter [name] MRN: [id number] DOB: [birth year]  Unsuccessful outbound call made today to assist with:  Transportation Needs   Outreach Attempt:  3rd Attempt.  Referral closed unable to contact patient.  A HIPAA compliant voice message was left requesting a return call.  Instructed patient to call back at [phone].Left message on mobile voicemail for patient to return my call regarding transportation.     Patient's friend [name] number [phone] not in service. Unable to leave message on home number voicemail is full.   [place] Resource Care Guide   ??[email or web address]  ?? [phone]   Website: [email or web address]  [email or web address]

## 2022-05-13 ENCOUNTER — Telehealth: Payer: Self-pay

## 2022-05-13 NOTE — Telephone Encounter (Signed)
   Telephone encounter was:  Unsuccessful.  05/13/2022 Name: JA OHMAN MRN: [id number] DOB: [birth year]  Unsuccessful outbound call made today to assist with:  Transportation Needs   Outreach Attempt:  3rd Attempt.  Referral closed unable to contact patient.  A HIPAA compliant voice message was left requesting a return call.  Instructed patient to call back at [phone].Left message on mobile voicemail for patient to return my call regarding transportation.  Unable to leave message for patient's friend [name] number is not in service.  [place] Resource Care Guide   ??[email or web address]  ?? [phone]   Website: [email or web address]  [email or web address]

## 2022-05-19 ENCOUNTER — Telehealth: Payer: Self-pay

## 2022-05-19 NOTE — Telephone Encounter (Signed)
   Telephone encounter was:  Successful.  05/19/2022 Name: Hunter [name] MRN: [id number] DOB: [birth year]  Hunter [name] is a 88 y.o. year old male who is a primary care patient of Yong Channel, Brayton Mars, MD . The community resource team was consulted for assistance with Transportation Needs   Care guide performed the following interventions: Spoke with patient's friend Hunter [name] to schedule transportation on the [place] platform for appointments on 05/25/22 and 06/22/22.  Follow Up Plan:   I will call Hunter [name] with pick up time once I receive a response from [place].  [place] Resource Care Guide   ??[email or web address]  ?? [phone]   Website: [email or web address]  [email or web address]

## 2022-05-19 NOTE — Telephone Encounter (Signed)
   Telephone encounter was:  Unsuccessful.  05/19/2022 Name: Hunter [name] MRN: [id number] DOB: 01-21-1934  Unsuccessful outbound call made today to assist with:  Transportation Needs   Outreach Attempt:  2nd Attempt  Unable to leave message, voicemail is full. Ride on 05/25/22 [facility] [employer] pick up time 12:20pm, Ride on 06/22/22 pick up time is 12:57pm. [employer] support team [phone]. [place]/[employer] [phone] save in contacts, will [name] to confirm rides and pickup times.  [place] Resource Care Guide   ??[name]  ?? [phone]   Website: [email or web address]  [email or web address]

## 2022-05-19 NOTE — Telephone Encounter (Signed)
   Telephone encounter was:  Unsuccessful.  05/19/2022 Name: Hunter [name] MRN: [id number] DOB: July 17, 1933  Unsuccessful outbound call made today to assist with:  Transportation Needs   Outreach Attempt:  3rd Attempt.  Referral closed unable to contact patient.  A HIPAA compliant voice message was left requesting a return call.  Instructed patient to call back at Left message on mobile voicemail for patient to return my call regarding transportation. Unable to leave message for patient's friend [name], number [phone] is not in service..  [place] Resource Care Guide   ??[email or web address]  ?? [phone]   Website: [email or web address]  [email or web address]

## 2022-05-20 ENCOUNTER — Telehealth: Payer: Self-pay

## 2022-05-20 NOTE — Telephone Encounter (Signed)
   Telephone encounter was:  Unsuccessful.  05/20/2022 Name: Hunter [name] MRN: [id number] DOB: 1934/01/05  Unsuccessful outbound call made today to assist with:  Transportation Needs   Outreach Attempt:  3rd Attempt.  Referral closed unable to contact patient.  Unable to leave message for patient's friend [name], voicemail is full. Attempted to reach [name] with ride confirmation information.  Ride on 05/25/22 [facility] [employer] pick up time 12:20pm, Ride on 06/22/22 pick up time is 12:57pm. [employer] support team [phone]. [place]/[employer] [phone] save in contacts, they will call [name] to confirm rides and pickup times.   [place] Resource Care Guide   ??[name]  ?? [phone]   Website: [email or web address]  [email or web address]

## 2022-05-20 NOTE — Telephone Encounter (Addendum)
05/20/2022  Hunter [name] DOB: 1934/06/25 MRN: [id number]   RIDER WAIVER AND RELEASE OF LIABILITY  For purposes of improving physical access to our facilities, [facility] partners with third-party transportation providers to provide [facility] patients or other authorized individuals the option of convenient, on-demand ground transportation services [employer]") for non-emergent medical transportation.  By opting to use and accept these [employer], I, the undersigned, hereby agree on behalf of myself, and on behalf of any minor child using the Government social research officer for whom I am the parent or legal guardian, as follows:  Government social research officer provided to me are provided by independent third-party transportation providers who are not [employer] or employees and who are unaffiliated with [employer]. [facility] is neither a transportation carrier nor a common or public carrier. [facility] has no control over the quality or safety of the transportation that occurs as a result of the [employer]. [facility] cannot guarantee that any third-party transportation provider will complete any arranged transportation service. [facility] makes no representation, warranty, or guarantee regarding the reliability, timeliness, quality, safety, suitability, or availability of any of the Transport Services or that they will be error free. I fully understand that traveling by vehicle involves risks and dangers of serious bodily injury, including permanent disability, paralysis, and death. I agree, on behalf of myself and on behalf of any minor child using the Transport Services for whom I am the parent or legal guardian, that the entire risk arising out of my use of the [employer] remains solely with me, to the maximum extent permitted under applicable law. The [employer] are provided "as is" and "as available." [place] disclaims all representations and warranties,  express, implied or statutory, not expressly set out in these terms, including the implied warranties of merchantability and fitness for a particular purpose. I hereby waive and release [place], its agents, employees, officers, directors, representatives, insurers, attorneys, assigns, successors, subsidiaries, and affiliates from any and all past, present, or future claims, demands, liabilities, actions, causes of action, or suits of any kind directly or indirectly arising from acceptance and use of the [employer]. I further waive and release [facility] and its affiliates from all present and future liability and responsibility for any injury or death to persons or damages to property caused by or related to the use of the [employer]. I have read this Waiver and Release of Liability, and I understand the terms used in it and their legal significance. This Waiver is freely and voluntarily given with the understanding that my right (as well as the right of any minor child for whom I am the parent or legal guardian using the [employer]) to legal recourse against  in connection with the [employer] is knowingly surrendered in return for use of these services.   I attest that I read the Ride Waiver and Release of Liability to Hunter [name], gave Mr. Hehn the opportunity to ask questions and answered the questions asked (if any). I affirm that Hunter [name] then provided consent for assistance with transportation.     Devonte Migues D Samauri Kellenberger                                Telephone encounter was:  Successful.  05/20/2022 Name: Hunter [name] MRN: [id number] DOB: 1934/03/30  Hunter [name] is a 88 y.o. year old male who  is a primary care patient of [name], MD . The community resource team was consulted for assistance with Transportation Needs   Care guide performed the following interventions: Spoke with patient to inform him of pick up times for  05/25/22 12:20pm [employer] 06/22/22 12:57pm.  Follow Up Plan:  Client will have Hunter [name] call me to confirm transportation details.  [place] Resource Care Guide   ??[email or web address]  ?? [phone]   Website: [email or web address]  [email or web address]

## 2022-05-20 NOTE — Telephone Encounter (Signed)
   Telephone encounter was:  Successful.  05/20/2022 Name: Hunter [name] MRN: [id number] DOB: [birth year]  Hunter [name] is a 88 y.o. year old male who is a primary care patient of Yong Channel, Brayton Mars, MD . The community resource team was consulted for assistance with Transportation Needs   Care guide performed the following interventions: Spoke with [place] at [place] form was received and placed on the patient's chart for him to sign and give to the driver. This is for his appointment with Garret Reddish MD on 06/22/22. Left message at [place] and [facility] [place] to confirm receipt of Guy Franco Waiver form for appointment on 05/25/22.   Follow Up Plan:  No further follow up planned at this time. The patient has been provided with needed resources.  [place] Resource Care Guide   ??[email or web address]  ?? [phone]   Website: [email or web address]  [email or web address]

## 2022-05-22 ENCOUNTER — Telehealth: Payer: Self-pay

## 2022-05-22 NOTE — Telephone Encounter (Signed)
   Telephone encounter was:  Successful.  05/22/2022 Name: FERRON ISHMAEL MRN: [id number] DOB: July 15, 1933  CAMBREN HELM is a 88 y.o. year old male who is a primary care patient of [name], MD . The community resource team was consulted for assistance with Transportation Needs   Care guide performed the following interventions: Spoke with patient's friend/caretaker [name] to inform her of the pickup times on 05/25/22 12:20pm and 06/22/22 12:57pm.   Follow Up Plan:  No further follow up planned at this time. The patient has been provided with needed resources.  [place] Resource Care Guide   ??[email or web address]  ?? [phone]   Website: [email or web address]  [email or web address]

## 2022-05-22 NOTE — Telephone Encounter (Signed)
   Telephone encounter was:  Successful.  05/22/2022 Name: [name] MRN: [id number] DOB: [birth year]  Hunter [name] is a 88 y.o. year old male who is a primary care patient of [name], MD . The community resource team was consulted for assistance with Transportation Needs   Care guide performed the following interventions: Spoke with Dr. Zigmund Daniel office waiver form has been placed on the patient's chart to sign and give to the driver appointment 98/02/21.  Follow Up Plan:  No further follow up planned at this time. The patient has been provided with needed resources.  [place] Resource Care Guide   ??[email or web address]  ?? [phone]   Website: [email or web address]  [email or web address]

## 2022-05-25 ENCOUNTER — Encounter (INDEPENDENT_AMBULATORY_CARE_PROVIDER_SITE_OTHER): Payer: Medicare Other | Admitting: Ophthalmology

## 2022-05-25 DIAGNOSIS — H353122 Nonexudative age-related macular degeneration, left eye, intermediate dry stage: Secondary | ICD-10-CM

## 2022-05-25 DIAGNOSIS — H43813 Vitreous degeneration, bilateral: Secondary | ICD-10-CM | POA: Diagnosis not present

## 2022-05-25 DIAGNOSIS — H353211 Exudative age-related macular degeneration, right eye, with active choroidal neovascularization: Secondary | ICD-10-CM

## 2022-05-25 DIAGNOSIS — I10 Essential (primary) hypertension: Secondary | ICD-10-CM

## 2022-05-25 DIAGNOSIS — H35033 Hypertensive retinopathy, bilateral: Secondary | ICD-10-CM | POA: Diagnosis not present

## 2022-06-21 ENCOUNTER — Other Ambulatory Visit: Payer: Self-pay | Admitting: Family Medicine

## 2022-06-22 ENCOUNTER — Ambulatory Visit: Payer: Medicare Other | Admitting: Family Medicine

## 2022-06-22 ENCOUNTER — Encounter (INDEPENDENT_AMBULATORY_CARE_PROVIDER_SITE_OTHER): Payer: Medicare Other | Admitting: Ophthalmology

## 2022-06-22 ENCOUNTER — Telehealth: Payer: Self-pay

## 2022-06-22 DIAGNOSIS — I10 Essential (primary) hypertension: Secondary | ICD-10-CM

## 2022-06-22 DIAGNOSIS — H35033 Hypertensive retinopathy, bilateral: Secondary | ICD-10-CM

## 2022-06-22 DIAGNOSIS — H353211 Exudative age-related macular degeneration, right eye, with active choroidal neovascularization: Secondary | ICD-10-CM

## 2022-06-22 DIAGNOSIS — H353122 Nonexudative age-related macular degeneration, left eye, intermediate dry stage: Secondary | ICD-10-CM | POA: Diagnosis not present

## 2022-06-22 DIAGNOSIS — H43813 Vitreous degeneration, bilateral: Secondary | ICD-10-CM | POA: Diagnosis not present

## 2022-06-22 NOTE — Telephone Encounter (Signed)
   Telephone encounter was:  Successful.  06/22/2022 Name: Hunter [name] MRN: [id number] DOB: 1933-09-15  Hunter [name] is a 88 y.o. year old male who is a primary care patient of Yong Channel, Brayton Mars, MD . The community resource team was consulted for assistance with Transportation Needs   Care guide performed the following interventions: Spoke with patient's neighbor [name] to confirm today's pick up time of 12:57pm.  Verfied time on [place]. [name] stated that she wrote it down. Also gave her the number for the [facility] transportation concierge line.  Follow Up Plan:  No further follow up planned at this time. The patient has been provided with needed resources.  [place] Resource Care Guide   ??[email or web address]  ?? [phone]   Website: [email or web address]  [email or web address]

## 2022-07-20 ENCOUNTER — Encounter (INDEPENDENT_AMBULATORY_CARE_PROVIDER_SITE_OTHER): Payer: Medicare Other | Admitting: Ophthalmology

## 2022-07-20 DIAGNOSIS — H353231 Exudative age-related macular degeneration, bilateral, with active choroidal neovascularization: Secondary | ICD-10-CM

## 2022-07-20 DIAGNOSIS — H43813 Vitreous degeneration, bilateral: Secondary | ICD-10-CM

## 2022-07-20 DIAGNOSIS — H35033 Hypertensive retinopathy, bilateral: Secondary | ICD-10-CM | POA: Diagnosis not present

## 2022-07-20 DIAGNOSIS — I10 Essential (primary) hypertension: Secondary | ICD-10-CM

## 2022-07-21 ENCOUNTER — Other Ambulatory Visit: Payer: Self-pay | Admitting: Family Medicine

## 2022-07-22 ENCOUNTER — Ambulatory Visit (INDEPENDENT_AMBULATORY_CARE_PROVIDER_SITE_OTHER): Payer: Medicare Other

## 2022-07-22 DIAGNOSIS — I442 Atrioventricular block, complete: Secondary | ICD-10-CM | POA: Diagnosis not present

## 2022-07-22 LAB — CUP PACEART REMOTE DEVICE CHECK
Battery Remaining Longevity: 42 mo
Battery Remaining Percentage: 43 %
Battery Voltage: 2.96 V
Brady Statistic AP VP Percent: 52 %
Brady Statistic AP VS Percent: 1 %
Brady Statistic AS VP Percent: 46 %
Brady Statistic AS VS Percent: 1 %
Brady Statistic RA Percent Paced: 49 %
Brady Statistic RV Percent Paced: 97 %
Date Time Interrogation Session: 20240110020015
Implantable Lead Connection Status: 753985
Implantable Lead Connection Status: 753985
Implantable Lead Implant Date: 20180730
Implantable Lead Implant Date: 20180730
Implantable Lead Location: 753859
Implantable Lead Location: 753860
Implantable Lead Model: 5076
Implantable Lead Model: 5076
Implantable Pulse Generator Implant Date: 20180730
Lead Channel Impedance Value: 400 Ohm
Lead Channel Impedance Value: 460 Ohm
Lead Channel Pacing Threshold Amplitude: 0.5 V
Lead Channel Pacing Threshold Amplitude: 1 V
Lead Channel Pacing Threshold Pulse Width: 0.5 ms
Lead Channel Pacing Threshold Pulse Width: 0.5 ms
Lead Channel Sensing Intrinsic Amplitude: 2.3 mV
Lead Channel Sensing Intrinsic Amplitude: 8.8 mV
Lead Channel Setting Pacing Amplitude: 2 V
Lead Channel Setting Pacing Amplitude: 2.5 V
Lead Channel Setting Pacing Pulse Width: 0.5 ms
Lead Channel Setting Sensing Sensitivity: 4 mV
Pulse Gen Model: 2272
Pulse Gen Serial Number: 8930553

## 2022-08-11 ENCOUNTER — Telehealth: Payer: Self-pay | Admitting: *Deleted

## 2022-08-11 NOTE — Patient Outreach (Signed)
  Care Coordination   08/11/2022 Name: [name] MRN: [id number] DOB: 1933-07-29   Care Coordination Outreach Attempts:  An unsuccessful telephone outreach was attempted today to offer the patient information about available care coordination services as a benefit of their health plan.   Follow Up Plan:  Additional outreach attempts will be made to offer the patient care coordination information and services.   Encounter Outcome:  No Answer   Care Coordination Interventions:  No, not indicated    Raina Mina, RN Care Management Coordinator [place] Office [phone]

## 2022-08-12 ENCOUNTER — Telehealth: Payer: Self-pay | Admitting: *Deleted

## 2022-08-12 NOTE — Patient Outreach (Signed)
  Care Coordination   08/12/2022 Name: Hunter [name] MRN: [id number] DOB: Jan 31, 1934   Care Coordination Outreach Attempts:  A second unsuccessful outreach was attempted today to offer the patient with information about available care coordination services as a benefit of their health plan.    Caregiver requested call back however unsuccessful. Follow Up Plan:  Additional outreach attempts will be made to offer the patient care coordination information and services.   Encounter Outcome:  No Answer   Care Coordination Interventions:  No, not indicated    Raina Mina, RN Care Management Coordinator [place] Office [phone]

## 2022-08-13 NOTE — Progress Notes (Signed)
Remote pacemaker transmission.   

## 2022-08-14 ENCOUNTER — Telehealth: Payer: Self-pay | Admitting: *Deleted

## 2022-08-14 NOTE — Progress Notes (Signed)
  Care Coordination Note  08/14/2022 Name: Hunter [name] MRN: [id number] DOB: 1933/07/27  Hunter [name] is a 89 y.o. year old male who is a primary care patient of [name], MD and is actively engaged with the care management team. I reached out to [place] by phone today to assist with re-scheduling a follow up visit with the RN Case Manager  Follow up plan: Telephone appointment with care management team member scheduled for: 08/20/2022  Julian Hy, [place] Direct Dial: [phone]

## 2022-08-17 ENCOUNTER — Encounter (INDEPENDENT_AMBULATORY_CARE_PROVIDER_SITE_OTHER): Payer: Medicare Other | Admitting: Ophthalmology

## 2022-08-17 DIAGNOSIS — I10 Essential (primary) hypertension: Secondary | ICD-10-CM

## 2022-08-17 DIAGNOSIS — H353231 Exudative age-related macular degeneration, bilateral, with active choroidal neovascularization: Secondary | ICD-10-CM

## 2022-08-17 DIAGNOSIS — H43813 Vitreous degeneration, bilateral: Secondary | ICD-10-CM

## 2022-08-17 DIAGNOSIS — H35033 Hypertensive retinopathy, bilateral: Secondary | ICD-10-CM | POA: Diagnosis not present

## 2022-08-18 ENCOUNTER — Other Ambulatory Visit: Payer: Self-pay | Admitting: Family Medicine

## 2022-08-20 ENCOUNTER — Telehealth: Payer: Self-pay | Admitting: *Deleted

## 2022-08-20 ENCOUNTER — Ambulatory Visit: Payer: Self-pay

## 2022-08-20 DIAGNOSIS — I10 Essential (primary) hypertension: Secondary | ICD-10-CM

## 2022-08-20 NOTE — Patient Instructions (Signed)
Visit Information  Thank you for taking time to visit with me today. Please don't hesitate to contact me if I can be of assistance to you.   Following are the goals we discussed today:   Goals Addressed             This Visit's Progress    Heart Failure Management       Patient Goals/Self Care Activities: -Weigh daily and record (notify MD with 3 lb weight gain over night or 5 lb in a week) -Adhere to low sodium diet  Interventions Today    Flowsheet Row Most Recent Value  Chronic Disease Discussed/Reviewed   Chronic disease discussed/reviewed during today's visit Congestive Heart Failure (CHF)  General Interventions   General Interventions Discussed/Reviewed General Interventions Discussed, Doctor Visits  Doctor Visits Discussed/Reviewed Doctor Visits Discussed  Exercise Interventions   Exercise Discussed/Reviewed Weight Managment  Weight Management Weight maintenance  Education Interventions   Education Provided Provided Verbal Education  Provided Verbal Education On When to see the doctor, Nutrition  Nutrition Interventions   Nutrition Discussed/Reviewed Decreasing salt             Our next appointment is by telephone on 09/21/22 at 1100  Please call the care guide team at [phone] if you need to cancel or reschedule your appointment.   If you are experiencing a Mental Health or [place] or need someone to talk to, please call the Suicide and Crisis Lifeline: 988   The patient verbalized understanding of instructions, educational materials, and care plan provided today and agreed to receive a mailed copy of patient instructions, educational materials, and care plan.   The patient has been provided with contact information for the care management team and has been advised to call with any health related questions or concerns.   Jone Baseman, RN, MSN [place] [facility] Care Management Coordinator Direct Line [phone]

## 2022-08-20 NOTE — Patient Outreach (Signed)
  Care Coordination   Follow Up Visit Note   08/20/2022 Name: Hunter [name] MRN: [id number] DOB: Jul 07, 1934  Hunter [name] is a 89 y.o. year old male who sees Hunter [name], Hunter Mars, MD for primary care. I spoke with  Hunter [name] by phone today.  What matters to the patients health and wellness today?  Transportation resources    Goals Addressed             This Visit's Progress    Heart Failure Management       Patient Goals/Self Care Activities: -Weigh daily and record (notify MD with 3 lb weight gain over night or 5 lb in a week) -Adhere to low sodium diet  Interventions Today    Flowsheet Row Most Recent Value  Chronic Disease Discussed/Reviewed   Chronic disease discussed/reviewed during today's visit Congestive Heart Failure (CHF)  General Interventions   General Interventions Discussed/Reviewed General Interventions Discussed, Doctor Visits  Doctor Visits Discussed/Reviewed Doctor Visits Discussed  Exercise Interventions   Exercise Discussed/Reviewed Weight Managment  Weight Management Weight maintenance  Education Interventions   Education Provided Provided Verbal Education  Provided Verbal Education On When to see the doctor, Nutrition  Nutrition Interventions   Nutrition Discussed/Reviewed Decreasing salt             SDOH assessments and interventions completed:  Yes  SDOH Interventions Today    Flowsheet Row Most Recent Value  SDOH Interventions   Transportation Interventions AMB Referral        Care Coordination Interventions:  Yes, provided   Follow up plan: Follow up call scheduled for March    Encounter Outcome:  Pt. Visit Completed   Jone Baseman, RN, MSN [place] [facility] Care Management Coordinator Direct Line [phone]

## 2022-08-20 NOTE — Telephone Encounter (Signed)
   Telephone encounter was:  Unsuccessful.  08/20/2022 Name: KURTIS ANASTASIA MRN: [id number] DOB: Nov 12, 1933  Unsuccessful outbound call made today to assist with:  Transportation Needs  Called caregiver number there was no voicemail able to be left for , so left message on his cell number this time  Outreach Attempt:  1st Attempt  A HIPAA compliant voice message was left requesting a return call.  Instructed patient to call back at [phone].  [place] [phone] [address] , [place] [address] Email : [email or web address]

## 2022-08-27 ENCOUNTER — Telehealth: Payer: Self-pay | Admitting: *Deleted

## 2022-08-27 NOTE — Telephone Encounter (Signed)
   Telephone encounter was:  Unsuccessful.  08/27/2022 Name: Hunter [name] MRN: [id number] DOB: [birth year]  Unsuccessful outbound call made today to assist with:  Transportation Needs   Outreach Attempt:  2nd Attempt   [place] [phone] [address] , [place] [address] Email : [email or web address] [email or web address] [email or web address]

## 2022-08-28 ENCOUNTER — Telehealth: Payer: Self-pay | Admitting: *Deleted

## 2022-08-28 NOTE — Telephone Encounter (Signed)
   Telephone encounter was:  Unsuccessful.  08/28/2022 Name: Hunter [name] MRN: [id number] DOB: 1933-09-21  Unsuccessful outbound call made today to assist with:  Transportation Needs   Outreach Attempt:  3rd Attempt.  Referral closed unable to contact patient. Patient friend mailbox is full and patient has not responded   [place] [phone] [address] , [place] [address] Email : [email or web address]

## 2022-09-01 ENCOUNTER — Ambulatory Visit: Payer: Medicare Other | Attending: Nurse Practitioner | Admitting: Nurse Practitioner

## 2022-09-01 ENCOUNTER — Ambulatory Visit: Payer: Medicare Other | Attending: Cardiology | Admitting: *Deleted

## 2022-09-01 ENCOUNTER — Encounter: Payer: Self-pay | Admitting: Nurse Practitioner

## 2022-09-01 ENCOUNTER — Telehealth: Payer: Self-pay | Admitting: Cardiology

## 2022-09-01 ENCOUNTER — Encounter: Payer: Self-pay | Admitting: *Deleted

## 2022-09-01 VITALS — BP 134/60 | HR 62 | Ht 71.0 in | Wt 241.4 lb

## 2022-09-01 VITALS — BP 177/74 | HR 63 | Wt 242.4 lb

## 2022-09-01 DIAGNOSIS — R0781 Pleurodynia: Secondary | ICD-10-CM | POA: Diagnosis not present

## 2022-09-01 DIAGNOSIS — R079 Chest pain, unspecified: Secondary | ICD-10-CM | POA: Diagnosis not present

## 2022-09-01 DIAGNOSIS — R600 Localized edema: Secondary | ICD-10-CM | POA: Diagnosis not present

## 2022-09-01 DIAGNOSIS — R0602 Shortness of breath: Secondary | ICD-10-CM | POA: Diagnosis not present

## 2022-09-01 DIAGNOSIS — Z79899 Other long term (current) drug therapy: Secondary | ICD-10-CM

## 2022-09-01 DIAGNOSIS — N183 Chronic kidney disease, stage 3 unspecified: Secondary | ICD-10-CM | POA: Diagnosis not present

## 2022-09-01 DIAGNOSIS — I10 Essential (primary) hypertension: Secondary | ICD-10-CM | POA: Diagnosis not present

## 2022-09-01 DIAGNOSIS — E785 Hyperlipidemia, unspecified: Secondary | ICD-10-CM | POA: Diagnosis not present

## 2022-09-01 DIAGNOSIS — I5032 Chronic diastolic (congestive) heart failure: Secondary | ICD-10-CM

## 2022-09-01 DIAGNOSIS — Z950 Presence of cardiac pacemaker: Secondary | ICD-10-CM | POA: Diagnosis not present

## 2022-09-01 DIAGNOSIS — I442 Atrioventricular block, complete: Secondary | ICD-10-CM

## 2022-09-01 NOTE — Telephone Encounter (Signed)
Pt c/o of Chest Pain: STAT if CP now or developed within 24 hours  1. Are you having CP right now? yes  2. Are you experiencing any other symptoms (ex. SOB, nausea, vomiting, sweating)? no  3. How long have you been experiencing CP?  1 week  4. Is your CP continuous or coming and going? Comes and goes   5. Have you taken Nitroglycerin?  no ?

## 2022-09-01 NOTE — Patient Instructions (Addendum)
Medication Instructions:  Your physician recommends that you continue on your current medications as directed. Please refer to the Current Medication list given to you today. Please take tylenol 1000 mg twice daily as needed Please take your furosemide daily as prescribed  Labwork: BMET & Magnesium level in 2-3 weeks (09/15/2022) Non-fasting [facility]  Testing/Procedures: Your physician has requested that you have an echocardiogram. Echocardiography is a painless test that uses sound waves to create images of your heart. It provides your doctor with information about the size and shape of your heart and how well your heart's chambers and valves are working. This procedure takes approximately one hour. There are no restrictions for this procedure. Please do NOT wear cologne, perfume, aftershave, or lotions (deodorant is allowed). Please arrive 15 minutes prior to your appointment time.  Follow-Up: Your physician recommends that you schedule a follow-up appointment in: 6-8 weeks  Any Other Special Instructions Will Be Listed Below (If Applicable).  If you need a refill on your cardiac medications before your next appointment, please call your pharmacy.

## 2022-09-01 NOTE — Telephone Encounter (Signed)
See nurse visit notes

## 2022-09-01 NOTE — Progress Notes (Signed)
Patient walked into office this morning c/o chest pain & sob.  Could not give me a number rating on the chest pain.  States has been going on x 1 week.  SOB has been going on for about the last 10 days, but seems to be getting worse.  Also, states pain is all across his ribcage as he says that he had been moving some furniture for his wife.  Cough x 2 weeks, non-productive.  Is scheduled to see his pcp in 2 weeks.  He has not taken any of his medications this morning.   Wife stated that she did not think he would go to ED even if we suggested that.  EKG done & scanned into EPIC.  Discussed with Dr. Harl Bowie - OV given for 1:00 today with Finis Bud, NP.

## 2022-09-01 NOTE — Progress Notes (Signed)
Cardiology Office Note:    Date:  09/01/2022  ID:  Hunter [name], DOB [birth year], MRN [id number]  PCP:  [name], MD   [facility] Cardiologist:  Carlyle Dolly, MD Electrophysiologist:  [name], MD (Inactive)     Referring MD: [name], MD   CC: Rib cage pain and shortness of breath/leg and pedal edema  History of Present Illness:    Hunter [name] is a 89 y.o. male with a hx of the following:  HFpEF CHB, s/p PPM Chronic left bundle branch block Hypertension Hyperlipidemia Chronic kidney disease stage III  Patient is a very pleasant 89 year old male with past medical history as mentioned above.  Last seen by Dr. Carlyle Dolly on April 17, 2022.  Patient reported taking Lasix about 2-3 times per week, had some ongoing leg edema as well as ongoing shortness of breath that had progressed.  Weight was up 6 pounds since April 2023.  Was compliant with his medications.  He had some for signs of fluid overload, Dr. Carlyle Dolly recommended better compliance of Lasix.  Was referred to lymphedema clinic.  Labs are normal.  Was told to follow-up in 6 months.  Patient came into the office this morning reporting chest pain and shortness of breath.  Nursing staff reported that symptoms were ongoing x 1 week, shortness of breath having ongoing for around 10 days, seemed to be getting worse.  Pain was located all across rib cage, he noted that he had been moving furniture for his wife.  Cough was reported as going on for 2 weeks, nonproductive.  Case was discussed with Dr. Harl Bowie and suggested office evaluation for today.  Pt is a difficult historian. While I was speaking with the patient today, he denies chest pain but admits to bilateral rib cage pain and along lateral sides of abdomen, does admit to shortness of breath noted recently. Denies any chest pain, palpitations, syncope, presyncope, dizziness, orthopnea, PND, significant weight  changes, acute bleeding, or claudication. Also admits to increased swelling along lower extremities recently. Friend states she believes his pain is due to the fact that his furniture is set low in his house. Denies tobacco use.    Past Medical History:  Diagnosis Date   Allergic rhinitis 02/02/2007   Anemia    Arthritis    "knees" (02/08/2017)   Chronic lower back pain    CKD (chronic kidney disease), stage III ([place]) 05/27/2011   Complete heart block 02/08/2017   Eczema    Essential hypertension 02/02/2007   Lasix 60 mg, losartan '50mg'$ ,  Amlodipine '5mg'$ --> 2.5 mg.  Usually have to repeat BP measurements  In the past-Maxzide 37.5-'25mg'$ .   GERD (gastroesophageal reflux disease) occasional   Hearing loss of both ears    Heart failure with preserved ejection fraction (HFpEF) 12/06/2013   History of skin cancer 03/26/2014   nose    Hx of echocardiogram    Echo (07/2013): Mild LVH, EF 0000000, grade 1 diastolic dysfunction, mild LAE, PASP 23   Hyperlipidemia    Hypertension    Hypothyroidism    LBBB (left bundle branch block)    Left patella fracture 2018   "no OR" (02/08/2017)   Major depression in partial remission 02/02/2007   Amitriptyline '25mg'$  for sleep (stop when runs out of #10 04/2018(, zoloft 100-->'150mg'$ --> '200mg'$    Mild neurocognitive disorder 06/21/2019   Neuropathy ([place]) 11/02/2011   Nocturia    Peripheral vascular disease ([place]) LOWER EXTREMITIES   Spinal stenosis  in cervical region 03/21/2010   Squamous cell skin cancer, penis: glans ([place]) 05/2010   Initial excision 11/11; recurrence, excision and laser Rx 9/13   Thrombocytopenia ([place]) 05/23/2012   Urinary frequency 09/05/2009   Possible BPH- see Shawna [facility] notes    Vitamin D deficiency 08/15/2008    Past Surgical History:  Procedure Laterality Date   CIRCUMCISION/ LASER DISSECTION PENILE GLANS CANCER  06-02-2010   CYSTOSCOPY WITH URETHRAL DILATATION  03/28/2012   Procedure: CYSTOSCOPY WITH URETHRAL DILATATION;  Surgeon: Ailene Rud, MD;  Location: [facility];  Service: Urology;  Laterality: N/A;  excision biopsy extensive meatal penile carcinoma meatoplasty   EXCISION RIGHT WRIST BENIGN TUMOR  2002 (APPROX)   hip surgery     INCISION AND DRAINAGE DEEP NECK ABSCESS     INGUINAL HERNIA REPAIR  1970's   KNEE ARTHROSCOPY Right 2017   LUMBAR LAMINECTOMY/DECOMPRESSION MICRODISCECTOMY N/A 05/24/2020   Procedure: OPEN LAMINECTOMY LUMBAR [place];  Surgeon: Karsten Ro, DO;  Location: [facility];  Service: Neurosurgery;  Laterality: N/A;   PACEMAKER IMPLANT N/A 02/08/2017   Procedure: Pacemaker Implant;  Surgeon: Deboraha Sprang, MD;  Location: [facility];  Service: Cardiovascular;  Laterality: N/A;   PACEMAKER IMPLANT  02/08/2017   SJM Assurity MRI dual chamber PPM implanted by Dr Caryl Comes for complete heart block   PENILE BX  05-09-2010   TONSILLECTOMY     TRANSURETHRAL RESECTION OF PROSTATE N/A 05/10/2015   Procedure: CYSTOSCOPY, URETHRAL MEATAL DILATION, TRANSURETHRAL RESECTION OF THE PROSTATE (TURP);  Surgeon: Carolan Clines, MD;  Location: [facility];  Service: Urology;  Laterality: N/A;    Current Medications: Current Meds  Medication Sig   amLODipine (NORVASC) 2.5 MG tablet TAKE 1 TABLET BY MOUTH DAILY   aspirin 81 MG chewable tablet Chew 81 mg by mouth daily.    atorvastatin (LIPITOR) 20 MG tablet TAKE 1 TABLET BY MOUTH DAILY   cyanocobalamin (,VITAMIN B-12,) 1000 MCG/ML injection 1000 mcg (1 mg) injection once per week for four weeks, followed by 1000 mcg injection once per month. Injections should be done at the pharmacy by pharmacy team due to memory concerns.   Cyanocobalamin (B-12 PO) Take 1 tablet by mouth daily.   ferrous sulfate 325 (65 FE) MG tablet Take 325 mg by mouth daily with breakfast.   furosemide (LASIX) 20 MG tablet TAKE THREE TABLETS BY MOUTH DAILY   levothyroxine (SYNTHROID) 75 MCG tablet TAKE 1 TABLET BY MOUTH EVERY MORNING   losartan (COZAAR) 50 MG tablet  TAKE 1 TABLET BY MOUTH DAILY   pantoprazole (PROTONIX) 40 MG tablet Take 1 tablet (40 mg total) by mouth daily.   sertraline (ZOLOFT) 100 MG tablet TAKE TWO TABLETS BY MOUTH DAILY   Vitamin D, Ergocalciferol, (DRISDOL) 1.25 MG (50000 UNIT) CAPS capsule TAKE 1 CAPSULE BY MOUTH EVERY 7  DAYS     Allergies:   Patient has no known allergies.   Social History   Socioeconomic History   Marital status: Divorced    Spouse name: Not on file   Number of children: 2   Years of education: 16   Highest education level: Bachelor's degree (e.g., BA, AB, BS)  Occupational History   Occupation: Retired  Tobacco Use   Smoking status: Never    Passive exposure: Never   Smokeless tobacco: Never   Tobacco comments:    Verified by [name]  Vaping Use   Vaping Use: Never used  Substance and Sexual Activity   Alcohol use:  Yes    Comment: 02/08/2017 "nothing in the 2000s"   Drug use: No   Sexual activity: Not Currently  Other Topics Concern   Not on file  Social History Narrative   Lives alone. Has a cat and a dog      Ambulates with cane      Right handed      Former Engineer, maintenance (IT)   Social Determinants of Health   Financial Resource Strain: Medium Risk (07/06/2021)   Overall Financial Resource Strain (CARDIA)    Difficulty of Paying Living Expenses: Somewhat hard  Food Insecurity: No Food Insecurity (05/05/2022)   Hunger Vital Sign    Worried About Running Out of Food in the Last Year: Never true    [place] in the Last Year: Never true  Transportation Needs: Unmet Transportation Needs (08/20/2022)   PRAPARE - Transportation    Lack of Transportation (Medical): Yes    Lack of Transportation (Non-Medical): Yes  Physical Activity: Inactive (07/06/2021)   Exercise Vital Sign    Days of Exercise per Week: 0 days    Minutes of Exercise per Session: 0 min  Stress: Stress Concern Present (07/06/2021)   [place]     Feeling of Stress : To some extent  Social Connections: Moderately Isolated (07/06/2021)   Social Connection and Isolation Panel [NHANES]    Frequency of Communication with Friends and Family: More than three times a week    Frequency of Social Gatherings with Friends and Family: More than three times a week    Attends Religious Services: More than 4 times per year    Active Member of [employer] or Organizations: No    Attends Music therapist: Never    Marital Status: Divorced     Family History: The patient's family history includes Arthritis in an other family member; Hyperlipidemia in an other family member; Hypertension in an other family member; Lung cancer in his mother; Prostate cancer in an other family member.  ROS:   Please see the history of present illness.     All other systems reviewed and are negative.  EKGs/Labs/Other Studies Reviewed:    The following studies were reviewed today:   EKG:  EKG is ordered today. EKG demonstrates AV paced rhythm, 61 bpm, similar to past EKG reading, no acute ischemic changes.   Echocardiogram on 05/26/2018: Study Conclusions   - Left ventricle: The cavity size was normal. Wall thickness was    normal. The estimated ejection fraction was 50%. Septal motion    suggestive of LBBB or RV pacing. Doppler parameters are    consistent with abnormal left ventricular relaxation (grade 1    diastolic dysfunction).  - Aortic valve: Mildly calcified annulus. Trileaflet.  - Mitral valve: Mildly calcified annulus. There was mild    regurgitation.  - Right ventricle: Pacer wire or catheter noted in right ventricle.  - Right atrium: Central venous pressure (est): 3 mm Hg.  - Atrial septum: No defect or patent foramen ovale was identified.  - Tricuspid valve: There was trivial regurgitation.  - Pulmonary arteries: PA peak pressure: 24 mm Hg (S).  - Pericardium, extracardiac: There was no pericardial effusion.  Recent Labs: 11/14/2021:  ALT 15; Hemoglobin 14.7; Platelets 120; TSH 3.70 04/17/2022: BUN 23; Creatinine, Ser 1.23; Magnesium 2.3; Potassium 4.3; Sodium 140  Recent Lipid Panel    Component Value Date/Time   CHOL 116 07/01/2021 1129   TRIG 117.0 07/01/2021 1129  TRIG 128 06/01/2006 1144   HDL 31.40 (L) 07/01/2021 1129   CHOLHDL 4 07/01/2021 1129   VLDL 23.4 07/01/2021 1129   LDLCALC 62 07/01/2021 1129   LDLCALC 120 (H) 02/09/2020 1627   LDLDIRECT 127.0 01/03/2019 1153    Physical Exam:    VS:  BP 134/60   Pulse 62   Ht '5\' 11"'$  (1.803 m)   Wt 241 lb 6.4 oz (109.5 kg)   SpO2 95%   BMI 33.67 kg/m     Wt Readings from Last 3 Encounters:  09/01/22 241 lb 6.4 oz (109.5 kg)  09/01/22 242 lb 6.4 oz (110 kg)  04/17/22 243 lb (110.2 kg)     GEN: Obese, 89 y.o. male in no acute distress HEENT: Normal NECK: No JVD; No carotid bruits CARDIAC: S1/S2, RRR, no murmurs, rubs, gallops; 2+ pulses RESPIRATORY:  Clear to auscultation without rales, wheezing or rhonchi  MUSCULOSKELETAL:  Nonpitting bilateral lower extremity edema (L>R) with evidence of pedal edema; No deformity  SKIN: Warm and dry NEUROLOGIC:  Alert and oriented x 3 PSYCHIATRIC:  Normal affect, distracted  ASSESSMENT:    1. Chronic heart failure with preserved ejection fraction ([place])   2. SOB (shortness of breath)   3. Medication management   4. Leg edema   5. Pedal edema   6. Complete heart block (HCC)   7. Cardiac pacemaker   8. Essential hypertension, benign   9. Hyperlipidemia, unspecified hyperlipidemia type   10. Rib pain   11. Stage 3 chronic kidney disease, unspecified whether stage 3a or 3b CKD ([place])    PLAN:    In order of problems listed above:  HFpEF, shortness of breath, leg/pedal edema TTE in 2019 showed EF 50%, septal motion was suggestive of LBBB and RV pacing, grade 1 DD. Weight is stable, but does note worsening shortness of breath and lower extremity swelling. Overall does not appear volume up on exam. Did suggest  increasing Lasix dosage to improve leg edema, however pt declines at this time. Will update Echo at this time. Continue Lasix and losartan. Will obtain BMET and Mag level prior to possibly increasing Lasix in the future or adjusting GDMT, pt is agreeable. Low sodium diet, fluid restriction <2L, and daily weights encouraged. Educated to contact our office for weight gain of 2 lbs overnight or 5 lbs in one week. Recommended wearing compression stockings.  CHB, s/p PPM Remote device check 07/2022 showed normal device function. EKG shows normal device function. Continue to follow up with EP.   HTN BP stable. Discussed to monitor BP at home at least 2 hours after medications and sitting for 5-10 minutes. Continue current medication regimen. Heart healthy diet and regular cardiovascular exercise encouraged.   HLD No recent lipid panel on file. Managed by PCP. Will discuss requesting labs at next OV. Continue atorvastatin. Heart healthy diet and regular cardiovascular exercise encouraged.   Rib cage pain Pt denies any chest pain, but admits to bilateral rib cage pain and along lateral sides of abdomen. The distribution of pain sounds MSK. Friend states she believes his pain is due to the fact that his furniture is set low in his house. Denies any injuries or falls. EKG reassuring today. Will continue to monitor for now. Recommended Tylenol 1,000 mg BID PRN for pain. ED precautions discussed.   CKD stage 3 Most recent sCr was 1.23 with eGFR 57. Will obtain BMET as mentioned above. Avoid nephrotoxic agents. Continue to follow with PCP.  7. Disposition: Follow-up with  me or APP in 6-8 weeks or sooner if anything changes.    Medication Adjustments/Labs and Tests Ordered: Current medicines are reviewed at length with the patient today.  Concerns regarding medicines are outlined above.  Orders Placed This Encounter  Procedures   Magnesium   Basic metabolic panel   ECHOCARDIOGRAM COMPLETE   No orders of  the defined types were placed in this encounter.   Patient Instructions  Medication Instructions:  Your physician recommends that you continue on your current medications as directed. Please refer to the Current Medication list given to you today. Please take tylenol 1000 mg twice daily as needed Please take your furosemide daily as prescribed  Labwork: BMET & Magnesium level in 2-3 weeks (09/15/2022) Non-fasting [facility]  Testing/Procedures: Your physician has requested that you have an echocardiogram. Echocardiography is a painless test that uses sound waves to create images of your heart. It provides your doctor with information about the size and shape of your heart and how well your heart's chambers and valves are working. This procedure takes approximately one hour. There are no restrictions for this procedure. Please do NOT wear cologne, perfume, aftershave, or lotions (deodorant is allowed). Please arrive 15 minutes prior to your appointment time.  Follow-Up: Your physician recommends that you schedule a follow-up appointment in: 6-8 weeks  Any Other Special Instructions Will Be Listed Below (If Applicable).  If you need a refill on your cardiac medications before your next appointment, please call your pharmacy.   Signed, Finis Bud, NP  09/04/2022 8:12 AM    [place]

## 2022-09-08 ENCOUNTER — Telehealth: Payer: Self-pay | Admitting: *Deleted

## 2022-09-08 NOTE — Telephone Encounter (Signed)
   Telephone encounter was:  Successful.  09/08/2022 Name: Hunter [name] MRN: [id number] DOB: 04-28-1934  Hunter [name] is a 89 y.o. year old male who is a primary care patient of [name], MD . The community resource team was consulted for assistance with Transportation Needs   Patient scheduled for transportation for 09/14/2022 and also provided information to caregiver about [place] dept of deaf and hard also put in a referral for [employer] transportation as a back up.   Care guide performed the following interventions: Patient provided with information about care guide support team and interviewed to confirm resource needs.  Follow Up Plan:   [facility] [phone] [address] , [place] [address] Email : [email or web address]

## 2022-09-14 ENCOUNTER — Encounter (INDEPENDENT_AMBULATORY_CARE_PROVIDER_SITE_OTHER): Payer: Medicare Other | Admitting: Ophthalmology

## 2022-09-14 DIAGNOSIS — I10 Essential (primary) hypertension: Secondary | ICD-10-CM

## 2022-09-14 DIAGNOSIS — H43813 Vitreous degeneration, bilateral: Secondary | ICD-10-CM | POA: Diagnosis not present

## 2022-09-14 DIAGNOSIS — H353231 Exudative age-related macular degeneration, bilateral, with active choroidal neovascularization: Secondary | ICD-10-CM

## 2022-09-14 DIAGNOSIS — H35033 Hypertensive retinopathy, bilateral: Secondary | ICD-10-CM

## 2022-09-15 DIAGNOSIS — R062 Wheezing: Secondary | ICD-10-CM | POA: Diagnosis not present

## 2022-09-15 DIAGNOSIS — R918 Other nonspecific abnormal finding of lung field: Secondary | ICD-10-CM | POA: Diagnosis not present

## 2022-09-15 DIAGNOSIS — J9811 Atelectasis: Secondary | ICD-10-CM | POA: Diagnosis not present

## 2022-09-17 ENCOUNTER — Other Ambulatory Visit: Payer: Medicare Other

## 2022-09-21 ENCOUNTER — Ambulatory Visit: Payer: Self-pay

## 2022-09-21 NOTE — Patient Instructions (Signed)
Visit Information  Thank you for taking time to visit with me today. Please don't hesitate to contact me if I can be of assistance to you.   Following are the goals we discussed today:   Goals Addressed             This Visit's Progress    Heart Failure Management       Patient Goals/Self Care Activities: -Weigh daily and record (notify MD with 3 lb weight gain over night or 5 lb in a week) -Adhere to low sodium diet  Interventions Today    Flowsheet Row Most Recent Value  Chronic Disease   Chronic disease during today's visit Congestive Heart Failure (CHF), Other  [Recent pneumonia]  General Interventions   Doctor Visits Discussed/Reviewed Doctor Visits Discussed  Exercise Interventions   Exercise Discussed/Reviewed Weight Managment  Weight Management Weight maintenance  Education Interventions   Education Provided Provided Education  Provided Verbal Education On When to see the doctor, Medication  Nutrition Interventions   Nutrition Discussed/Reviewed Decreasing salt  Pharmacy Interventions   Pharmacy Dicussed/Reviewed Medications and their functions             Our next appointment is by telephone on 10/19/22 at 1100  Please call the care guide team at [phone] if you need to cancel or reschedule your appointment.   If you are experiencing a Mental Health or [place] or need someone to talk to, please call the Suicide and Crisis Lifeline: 988   The patient verbalized understanding of instructions, educational materials, and care plan provided today and agreed to receive a mailed copy of patient instructions, educational materials, and care plan.   The patient has been provided with contact information for the care management team and has been advised to call with any health related questions or concerns.   Jone Baseman, RN, MSN [facility] Care Management Coordinator Direct Line [phone]

## 2022-09-21 NOTE — Patient Outreach (Addendum)
  Care Coordination   Follow Up Visit Note   09/21/2022 Name: Hunter [name] MRN: [id number] DOB: May 24, 1934  Hunter [name] is a 89 y.o. year old male who sees Yong Channel, Brayton Mars, MD for primary care. I spoke with  [name] friend by phone today.  What matters to the patients health and wellness today?  Recent pneumonia    Goals Addressed             This Visit's Progress    Heart Failure Management       Patient Goals/Self Care Activities: -Weigh daily and record (notify MD with 3 lb weight gain over night or 5 lb in a week) -Adhere to low sodium diet  Interventions Today    Flowsheet Row Most Recent Value  Chronic Disease   Chronic disease during today's visit Congestive Heart Failure (CHF), Other  [Recent pneumonia]  General Interventions   Doctor Visits Discussed/Reviewed Doctor Visits Discussed  Exercise Interventions   Exercise Discussed/Reviewed Weight Managment  Weight Management Weight maintenance  Education Interventions   Education Provided Provided Education  Provided Verbal Education On When to see the doctor, Medication  Nutrition Interventions   Nutrition Discussed/Reviewed Decreasing salt  Pharmacy Interventions   Pharmacy Dicussed/Reviewed Medications and their functions             SDOH assessments and interventions completed:  Yes     Care Coordination Interventions:  Yes, provided   Follow up plan: Follow up call scheduled for April    Encounter Outcome:  Pt. Visit Completed   Jone Baseman, RN, MSN [facility] Care Management Coordinator Direct Line [phone]

## 2022-09-29 ENCOUNTER — Ambulatory Visit: Payer: Medicare Other | Admitting: Family Medicine

## 2022-09-30 ENCOUNTER — Ambulatory Visit: Payer: Medicare Other | Admitting: Family Medicine

## 2022-10-06 ENCOUNTER — Ambulatory Visit: Payer: Medicare Other | Admitting: Family Medicine

## 2022-10-08 ENCOUNTER — Telehealth: Payer: Self-pay

## 2022-10-08 NOTE — Patient Outreach (Signed)
  Care Coordination   Follow Up Visit Note   10/08/2022 Name: ENOS AIKINS MRN: [id number] DOB: 02-18-1934  LONDYN CODAY is a 89 y.o. year old male who sees Yong Channel, Brayton Mars, MD for primary care. I  spoke with [name] concerning transportation for patient. She states that she is not sure who to call.  Message sent to Transportation Care Guide for  follow up with Ms. Mancel Bale.    What matters to the patients health and wellness today?  Transportation Access    Goals Addressed             This Visit's Progress    Heart Failure Management       Patient Goals/Self Care Activities: -Weigh daily and record (notify MD with 3 lb weight gain over night or 5 lb in a week) -Adhere to low sodium diet  Interventions Today    Flowsheet Row Most Recent Value  General Interventions   General Interventions Discussed/Reviewed Communication with  Communication with --  [Care Guide for transportation]             SDOH assessments and interventions completed:  Yes     Care Coordination Interventions:  Yes, provided   Follow up plan: Follow up call scheduled for April    Encounter Outcome:  Pt. Visit Completed   Jone Baseman, RN, MSN [facility] Care Management Coordinator Direct Line [phone]

## 2022-10-12 ENCOUNTER — Encounter (INDEPENDENT_AMBULATORY_CARE_PROVIDER_SITE_OTHER): Payer: Medicare Other | Admitting: Ophthalmology

## 2022-10-12 ENCOUNTER — Encounter (INDEPENDENT_AMBULATORY_CARE_PROVIDER_SITE_OTHER): Payer: Self-pay

## 2022-10-13 ENCOUNTER — Ambulatory Visit: Payer: Medicare Other | Admitting: Cardiology

## 2022-10-13 ENCOUNTER — Telehealth: Payer: Self-pay | Admitting: *Deleted

## 2022-10-13 NOTE — Telephone Encounter (Signed)
Telephone encounter was:  Successful.  10/13/2022 Name: Hunter [name] MRN: [id number] DOB: [birth year]  Hunter [name] is a 89 y.o. year old male who is a primary care patient of Yong Channel, Brayton Mars, MD . The community resource team was consulted for assistance with Transportation Needs   Care guide performed the following interventions: Follow up call placed to community resources to determine status of patients referral. patient scheduled for retina visit 10/27/2022 at 1215 due to previous transportation no show  Follow Up Plan:  No further follow up planned at this time. The patient has been provided with needed resources. [facility] [phone] [address] , [place] [address] Email : Hunter [name]. [email or web address] DOB: [birth year] MRN: [id number]   RIDER WAIVER AND RELEASE OF LIABILITY  For purposes of improving physical access to our facilities, [facility] is pleased to partner with third parties to provide [facility] patients or other authorized individuals the option of convenient, on-demand ground transportation services (the Technical brewer") through use of the technology service that enables users to request on-demand ground transportation from independent third-party providers.  By opting to use and accept these [employer], I, the undersigned, hereby agree on behalf of myself, and on behalf of any minor child using the Government social research officer for whom I am the parent or legal guardian, as follows:  Government social research officer provided to me are provided by independent third-party transportation providers who are not [employer] or employees and who are unaffiliated with [employer]. [place] is neither a transportation carrier nor a common or public carrier. [facility] has no control over the quality or safety of the transportation that occurs as a result of the [employer]. [facility] cannot guarantee that any third-party transportation provider will complete any arranged transportation service. [facility] makes no representation, warranty, or guarantee regarding the reliability, timeliness, quality, safety, suitability, or availability of any of the Transport Services or that they will be error free. I fully understand that traveling by vehicle involves risks and dangers of serious bodily injury, including permanent disability, paralysis, and death. I agree, on behalf of myself and on behalf of any minor child using the Transport Services for whom I am the parent or legal guardian, that the entire risk arising out of my use of the [employer] remains solely with me, to the maximum extent permitted under applicable law. The [employer] are provided "as is" and "as available." [facility] disclaims all representations and warranties, express, implied or statutory, not expressly set out in these terms, including the implied warranties of merchantability and fitness for a particular purpose. I hereby waive and release [place], its agents, employees, officers, directors, representatives, insurers, attorneys, assigns, successors, subsidiaries, and affiliates from any and all past, present, or future claims, demands, liabilities, actions, causes of action, or suits of any kind directly or indirectly arising from acceptance and use of the [employer]. I further waive and release [facility] and its affiliates from all present and future liability and responsibility for any injury or death to persons or damages to property caused by or related to the use of the [employer]. I have read this Waiver and Release of Liability, and I understand the terms used in it and their legal significance. This Waiver is freely and voluntarily given with the understanding that my right (as well as the right of any minor  child for whom I am the parent or legal  guardian using the Government social research officer) to legal recourse against  in connection with the [employer] is knowingly surrendered in return for use of these services.   I attest that I read the consent document to Hunter [name], gave Mr. Robberson the opportunity to ask questions and answered the questions asked (if any). I affirm that Hunter [name] then provided consent for he's participation in this program.     Hunter [name]

## 2022-10-16 ENCOUNTER — Encounter: Payer: Medicare Other | Admitting: Internal Medicine

## 2022-10-16 ENCOUNTER — Ambulatory Visit: Payer: Medicare Other | Attending: Internal Medicine | Admitting: Cardiovascular Disease

## 2022-10-16 ENCOUNTER — Encounter: Payer: Self-pay | Admitting: Cardiovascular Disease

## 2022-10-16 VITALS — BP 130/64 | HR 70 | Ht 71.0 in | Wt 236.8 lb

## 2022-10-16 DIAGNOSIS — I442 Atrioventricular block, complete: Secondary | ICD-10-CM | POA: Diagnosis not present

## 2022-10-16 DIAGNOSIS — I5032 Chronic diastolic (congestive) heart failure: Secondary | ICD-10-CM | POA: Diagnosis not present

## 2022-10-16 DIAGNOSIS — R0602 Shortness of breath: Secondary | ICD-10-CM | POA: Diagnosis not present

## 2022-10-16 DIAGNOSIS — Z79899 Other long term (current) drug therapy: Secondary | ICD-10-CM | POA: Diagnosis not present

## 2022-10-16 NOTE — Patient Instructions (Addendum)
Medication Instructions:  Continue all current medications.   Labwork: none  Testing/Procedures: none  Follow-Up: 1 year - Dr.  Nelly Laurence   Any Other Special Instructions Will Be Listed Below (If Applicable). Need to reschedule the Echo & follow up appointment for after this.   Reminder - need to do labs as well.   If you need a refill on your cardiac medications before your next appointment, please call your pharmacy.

## 2022-10-16 NOTE — Progress Notes (Signed)
PCP: Shelva MajesticHunter, Stephen O, MD   Primary EP:  Dr Harle BattiestAllred  Hunter [name] is a 89 y.o. male who presents today for routine electrophysiology followup.  Since last being seen in our clinic, the patient reports doing reasonably well.  He has stable SOB and edema.  Compliance has been a real issue.  He has very poor diet.    Today, he denies symptoms of palpitations, chest pain, dizziness, presyncope, or syncope.  The patient is otherwise without complaint today.  He has chronic shortness of breath.   Sharlene Dorylizabeth Peck ordered an echocardiogram during a routine visit in December at which time he is complaining of chest pain and shortness of breath.  The echo was never performed.  Past Medical History:  Diagnosis Date   Allergic rhinitis 02/02/2007   Anemia    Arthritis    "knees" (02/08/2017)   Chronic lower back pain    CKD (chronic kidney disease), stage III 05/27/2011   Complete heart block 02/08/2017   Eczema    Essential hypertension 02/02/2007   Lasix 60 mg, losartan 50mg ,  Amlodipine 5mg --> 2.5 mg.  Usually have to repeat BP measurements  In the past-Maxzide 37.5-25mg .   GERD (gastroesophageal reflux disease) occasional   Hearing loss of both ears    Heart failure with preserved ejection fraction (HFpEF) 12/06/2013   History of skin cancer 03/26/2014   nose    Hx of echocardiogram    Echo (07/2013): Mild LVH, EF 55-60%, grade 1 diastolic dysfunction, mild LAE, PASP 23   Hyperlipidemia    Hypertension    Hypothyroidism    LBBB (left bundle branch block)    Left patella fracture 2018   "no OR" (02/08/2017)   Major depression in partial remission 02/02/2007   Amitriptyline 25mg  for sleep (stop when runs out of #10 04/2018(, zoloft 100-->150mg --> 200mg    Mild neurocognitive disorder 06/21/2019   Neuropathy (HCC) 11/02/2011   Nocturia    Peripheral vascular disease LOWER EXTREMITIES   Spinal stenosis in cervical region 03/21/2010   Squamous cell skin cancer, penis: glans 05/2010    Initial excision 11/11; recurrence, excision and laser Rx 9/13   Thrombocytopenia 05/23/2012   Urinary frequency 09/05/2009   Possible BPH- see Artist PaisYoo notes    Vitamin D deficiency 08/15/2008   Past Surgical History:  Procedure Laterality Date   CIRCUMCISION/ LASER DISSECTION PENILE GLANS CANCER  06-02-2010   CYSTOSCOPY WITH URETHRAL DILATATION  03/28/2012   Procedure: CYSTOSCOPY WITH URETHRAL DILATATION;  Surgeon: Kathi LudwigSigmund I Tannenbaum, MD;  [facility];  Service: Urology;  Laterality: N/A;  excision biopsy extensive meatal penile carcinoma meatoplasty   EXCISION RIGHT WRIST BENIGN TUMOR  2002 (APPROX)   hip surgery     INCISION AND DRAINAGE DEEP NECK ABSCESS     INGUINAL HERNIA REPAIR  1970's   KNEE ARTHROSCOPY Right 2017   LUMBAR LAMINECTOMY/DECOMPRESSION MICRODISCECTOMY N/A 05/24/2020   Procedure: OPEN LAMINECTOMY LUMBAR THREE-LUMBAR FOUR;  Surgeon: Bethann Gooawley, Troy C, DO;  Location: [facility];  Service: Neurosurgery;  Laterality: N/A;   PACEMAKER IMPLANT N/A 02/08/2017   Procedure: Pacemaker Implant;  Surgeon: Duke SalviaKlein, Steven C, MD;  [facility];  Service: Cardiovascular;  Laterality: N/A;   PACEMAKER IMPLANT  02/08/2017   SJM Assurity MRI dual chamber PPM implanted by Dr Graciela HusbandsKlein for complete heart block   PENILE BX  05-09-2010   TONSILLECTOMY     TRANSURETHRAL RESECTION OF PROSTATE N/A 05/10/2015   Procedure: CYSTOSCOPY, URETHRAL MEATAL DILATION, TRANSURETHRAL  RESECTION OF THE PROSTATE (TURP);  Surgeon: Jethro Bolus, MD;  Location: [facility];  Service: Urology;  Laterality: N/A;    ROS- all systems are reviewed and negative except as per HPI above  Current Outpatient Medications  Medication Sig Dispense Refill   albuterol (VENTOLIN HFA) 108 (90 Base) MCG/ACT inhaler Inhale 2 puffs into the lungs every 6 (six) hours as needed.     amLODipine (NORVASC) 2.5 MG tablet TAKE 1 TABLET BY MOUTH DAILY 30 tablet 2   aspirin 81 MG chewable tablet Chew 81 mg by  mouth daily.      atorvastatin (LIPITOR) 20 MG tablet TAKE 1 TABLET BY MOUTH DAILY 90 tablet 3   Cyanocobalamin (B-12 PO) Take 1 tablet by mouth daily.     ferrous sulfate 325 (65 FE) MG tablet Take 325 mg by mouth daily with breakfast.     furosemide (LASIX) 20 MG tablet TAKE THREE TABLETS BY MOUTH DAILY 270 tablet 1   levothyroxine (SYNTHROID) 75 MCG tablet TAKE 1 TABLET BY MOUTH EVERY MORNING 90 tablet 1   losartan (COZAAR) 50 MG tablet TAKE 1 TABLET BY MOUTH DAILY 90 tablet 1   pantoprazole (PROTONIX) 40 MG tablet Take 1 tablet (40 mg total) by mouth daily. 30 tablet 1   sertraline (ZOLOFT) 100 MG tablet TAKE TWO TABLETS BY MOUTH DAILY 180 tablet 3   Vitamin D, Ergocalciferol, (DRISDOL) 1.25 MG (50000 UNIT) CAPS capsule TAKE 1 CAPSULE BY MOUTH EVERY 7  DAYS 12 capsule 3   No current facility-administered medications for this visit.    Physical Exam: Vitals:   10/16/22 1057  BP: 130/64  Pulse: 70  SpO2: 96%  Weight: 236 lb 12.8 oz (107.4 kg)  Height: 5\' 11"  (1.803 m)    Gen: Appears comfortable, well-nourished CV: RRR, no dependent edema The device site is normal -- no tenderness, edema, drainage, redness, threatened erosion. Pulm: breathing easily   Pacemaker interrogation- reviewed in detail today,  See PACEART report    Assessment and Plan:  1. Symptomatic complete heart block Normal pacemaker function See Pace Art report No changes today he is device dependant today  2. HTN Stable No change required today  3. Chronic diastolic dysfunction Sodium restriction is advised  4.  Shortness of breath Will schedule the echo previously ordered    I will see in a year  Maurice Small, MD  10/16/2022 11:16 AM

## 2022-10-19 ENCOUNTER — Ambulatory Visit: Payer: Self-pay

## 2022-10-19 NOTE — Patient Outreach (Signed)
  Care Coordination   Follow Up Visit Note   10/19/2022 Name: Hunter [name] MRN: [id number] DOB: December 31, 1933  Hunter [name] is a 89 y.o. year old male who sees [name], [name], MD for primary care. I spoke with  Andrey Spearman by phone today. Patient doing ok, No  transportation problems presently.  Patient not really following low salt diet. Expressed concern about food items patient eats.    What matters to the patients health and wellness today?  Health Maintenance     Goals Addressed             This Visit's Progress    Heart Failure Management       Patient Goals/Self Care Activities: -Weigh daily and record (notify MD with 3 lb weight gain over night or 5 lb in a week) -Adhere to low sodium diet     Interventions Today    Flowsheet Row Most Recent Value  Chronic Disease   Chronic disease during today's visit Congestive Heart Failure (CHF)  General Interventions   General Interventions Discussed/Reviewed General Interventions Reviewed  Doctor Visits Discussed/Reviewed Doctor Visits Discussed  Exercise Interventions   Exercise Discussed/Reviewed Weight Managment  Weight Management Weight maintenance  Education Interventions   Education Provided Provided Education  Provided Verbal Education On When to see the doctor  [heart failure management]  Nutrition Interventions   Nutrition Discussed/Reviewed Decreasing salt, Nutrition Discussed               SDOH assessments and interventions completed:  Yes     Care Coordination Interventions:  Yes, provided   Follow up plan: Follow up call scheduled for May    Encounter Outcome:  Pt. Visit Completed   Bary Leriche, RN, MSN [facility] Care Management Care Management Coordinator Direct Line [phone]

## 2022-10-19 NOTE — Progress Notes (Signed)
I have reviewed and agree with note, evaluation, plan.   Anglea Gordner, MD  

## 2022-10-19 NOTE — Patient Instructions (Signed)
Visit Information  Thank you for taking time to visit with me today. Please don't hesitate to contact me if I can be of assistance to you.   Following are the goals we discussed today:   Goals Addressed             This Visit's Progress    Heart Failure Management       Patient Goals/Self Care Activities: -Weigh daily and record (notify MD with 3 lb weight gain over night or 5 lb in a week) -Adhere to low sodium diet     Interventions Today    Flowsheet Row Most Recent Value  Chronic Disease   Chronic disease during today's visit Congestive Heart Failure (CHF)  General Interventions   General Interventions Discussed/Reviewed General Interventions Reviewed  Doctor Visits Discussed/Reviewed Doctor Visits Discussed  Exercise Interventions   Exercise Discussed/Reviewed Weight Managment  Weight Management Weight maintenance  Education Interventions   Education Provided Provided Education  Provided Verbal Education On When to see the doctor  [heart failure management]  Nutrition Interventions   Nutrition Discussed/Reviewed Decreasing salt, Nutrition Discussed               Our next appointment is by telephone on 11/16/22 at 1100  Please call the care guide team at [phone] if you need to cancel or reschedule your appointment.   If you are experiencing a Mental Health or Behavioral Health Crisis or need someone to talk to, please call the Suicide and Crisis Lifeline: 988   The patient verbalized understanding of instructions, educational materials, and care plan provided today and DECLINED offer to receive copy of patient instructions, educational materials, and care plan.   The patient has been provided with contact information for the care management team and has been advised to call with any health related questions or concerns.   SIGNATURE

## 2022-10-20 ENCOUNTER — Ambulatory Visit: Payer: Medicare Other | Admitting: Nurse Practitioner

## 2022-10-21 ENCOUNTER — Ambulatory Visit (INDEPENDENT_AMBULATORY_CARE_PROVIDER_SITE_OTHER): Payer: Medicare Other

## 2022-10-21 DIAGNOSIS — I442 Atrioventricular block, complete: Secondary | ICD-10-CM | POA: Diagnosis not present

## 2022-10-21 LAB — CUP PACEART REMOTE DEVICE CHECK
Battery Remaining Longevity: 40 mo
Battery Remaining Percentage: 40 %
Battery Voltage: 2.96 V
Brady Statistic AP VP Percent: 54 %
Brady Statistic AP VS Percent: 1 %
Brady Statistic AS VP Percent: 41 %
Brady Statistic AS VS Percent: 1 %
Brady Statistic RA Percent Paced: 50 %
Brady Statistic RV Percent Paced: 95 %
Date Time Interrogation Session: 20240410020014
Implantable Lead Connection Status: 753985
Implantable Lead Connection Status: 753985
Implantable Lead Implant Date: 20180730
Implantable Lead Implant Date: 20180730
Implantable Lead Location: 753859
Implantable Lead Location: 753860
Implantable Lead Model: 5076
Implantable Lead Model: 5076
Implantable Pulse Generator Implant Date: 20180730
Lead Channel Impedance Value: 390 Ohm
Lead Channel Impedance Value: 480 Ohm
Lead Channel Pacing Threshold Amplitude: 0.5 V
Lead Channel Pacing Threshold Amplitude: 0.75 V
Lead Channel Pacing Threshold Pulse Width: 0.5 ms
Lead Channel Pacing Threshold Pulse Width: 0.5 ms
Lead Channel Sensing Intrinsic Amplitude: 2 mV
Lead Channel Sensing Intrinsic Amplitude: 8.2 mV
Lead Channel Setting Pacing Amplitude: 2 V
Lead Channel Setting Pacing Amplitude: 2.5 V
Lead Channel Setting Pacing Pulse Width: 0.5 ms
Lead Channel Setting Sensing Sensitivity: 4 mV
Pulse Gen Model: 2272
Pulse Gen Serial Number: 8930553

## 2022-10-27 ENCOUNTER — Encounter (INDEPENDENT_AMBULATORY_CARE_PROVIDER_SITE_OTHER): Payer: Medicare Other | Admitting: Ophthalmology

## 2022-10-27 DIAGNOSIS — H35033 Hypertensive retinopathy, bilateral: Secondary | ICD-10-CM | POA: Diagnosis not present

## 2022-10-27 DIAGNOSIS — H353231 Exudative age-related macular degeneration, bilateral, with active choroidal neovascularization: Secondary | ICD-10-CM

## 2022-10-27 DIAGNOSIS — I10 Essential (primary) hypertension: Secondary | ICD-10-CM | POA: Diagnosis not present

## 2022-10-27 DIAGNOSIS — H43813 Vitreous degeneration, bilateral: Secondary | ICD-10-CM | POA: Diagnosis not present

## 2022-11-12 ENCOUNTER — Ambulatory Visit: Payer: Medicare Other | Attending: Nurse Practitioner

## 2022-11-12 DIAGNOSIS — R0602 Shortness of breath: Secondary | ICD-10-CM | POA: Diagnosis not present

## 2022-11-12 LAB — ECHOCARDIOGRAM COMPLETE
AR max vel: 1.95 cm2
AV Area VTI: 2.06 cm2
AV Area mean vel: 1.85 cm2
AV Mean grad: 5 mmHg
AV Peak grad: 9.2 mmHg
Ao pk vel: 1.52 m/s
Area-P 1/2: 2.04 cm2
Calc EF: 59.3 %
MV M vel: 4.54 m/s
MV Peak grad: 82.3 mmHg
S' Lateral: 2.6 cm
Single Plane A2C EF: 53.9 %
Single Plane A4C EF: 60.4 %

## 2022-11-13 ENCOUNTER — Telehealth: Payer: Self-pay

## 2022-11-13 ENCOUNTER — Encounter: Payer: Self-pay | Admitting: Nurse Practitioner

## 2022-11-13 NOTE — Telephone Encounter (Signed)
Patient notified and verbalized understanding. Patient had no questions or concerns at this time. PCP copied 

## 2022-11-13 NOTE — Telephone Encounter (Signed)
-----   Message from Sharlene Dory, NP sent at 11/11/2022 11:41 PM EDT ----- Labs are stable.  Sharlene Dory, AGNP-C

## 2022-11-13 NOTE — Telephone Encounter (Signed)
Error

## 2022-11-16 ENCOUNTER — Ambulatory Visit: Payer: Self-pay

## 2022-11-16 NOTE — Patient Instructions (Signed)
Visit Information  Thank you for taking time to visit with me today. Please don't hesitate to contact me if I can be of assistance to you.   Following are the goals we discussed today:   Goals Addressed             This Visit's Progress    Heart Failure Management       Patient Goals/Self Care Activities: -Patient/Caregiver will self-administer medications as prescribed as evidenced by self-report/primary caregiver report  -Patient/Caregiver will attend all scheduled provider appointments as evidenced by clinician review of documented attendance to scheduled appointments and patient/caregiver report -Patient/Caregiver will call provider office for new concerns or questions as evidenced by review of documented incoming telephone call notes and patient report  -Weigh daily and record (notify MD with 3 lb weight gain over night or 5 lb in a week) -Adhere to low sodium diet                 Our next appointment is by telephone on 12/15/22 at 1030  Please call the care guide team at [phone] if you need to cancel or reschedule your appointment.   If you are experiencing a Mental Health or Behavioral Health Crisis or need someone to talk to, please call the Suicide and Crisis Lifeline: 988   The patient verbalized understanding of instructions, educational materials, and care plan provided today and DECLINED offer to receive copy of patient instructions, educational materials, and care plan.   The patient has been provided with contact information for the care management team and has been advised to call with any health related questions or concerns.   Bary Leriche, RN, MSN [facility] Care Management Care Management Coordinator Direct Line [phone]

## 2022-11-16 NOTE — Patient Outreach (Addendum)
  Care Coordination   Follow Up Visit Note   11/16/2022 Name: Hunter [name] MRN: [id number] DOB: [birth year]  Hunter [name] is a 89 y.o. year old male who sees [name], [name], MD for primary care. I spoke with  Andrey Spearman by phone today. Patient doing okay. Recent fall at MD office. No injuries just some bruising to hip area.  [name] also reports some swelling to legs and feet. Cardiology appointment on 11/19/22.    What matters to the patients health and wellness today?  Maintaining health    Goals Addressed             This Visit's Progress    Heart Failure Management       Patient Goals/Self Care Activities: -Patient/Caregiver will self-administer medications as prescribed as evidenced by self-report/primary caregiver report  -Patient/Caregiver will attend all scheduled provider appointments as evidenced by clinician review of documented attendance to scheduled appointments and patient/caregiver report -Patient/Caregiver will call provider office for new concerns or questions as evidenced by review of documented incoming telephone call notes and patient report  -Weigh daily and record (notify MD with 3 lb weight gain over night or 5 lb in a week) -Adhere to low sodium diet                 SDOH assessments and interventions completed:  Yes     Care Coordination Interventions:  Yes, provided   Follow up plan: Follow up call scheduled for June    Encounter Outcome:  Pt. Visit Completed   Bary Leriche, RN, MSN [facility] Care Management Care Management Coordinator Direct Line [phone]

## 2022-11-17 ENCOUNTER — Other Ambulatory Visit: Payer: Self-pay | Admitting: Family Medicine

## 2022-11-18 ENCOUNTER — Telehealth: Payer: Self-pay

## 2022-11-18 NOTE — Telephone Encounter (Signed)
Telephone encounter was:  Successful.  11/18/2022 Name: Hunter [name] MRN: [id number] DOB: [birth year]  Hunter [name] is a 89 y.o. year old male who is a primary care patient of Hunter Majestic, MD . The community resource team was consulted for assistance with Transportation Needs   Care [name] performed the following interventions: Spoke with patient's friend Hunter [name] to arrange transportation for 11/23/22. I will send request to [employer] and call Hunter [name] once I receive confirmation. I also gave Hunter [name] the number for [facility] Concierge Line/Transportation [phone] to call for future transportation request so if I am out of the office someone will get her request.  Follow Up Plan:   I will contact Hunter [name] once I receive confirmation from [employer].  11/18/2022  Hunter [name] DOB: Sep 28, 1933 MRN: [id number]   RIDER WAIVER AND RELEASE OF LIABILITY  For the purposes of helping with transportation needs, Hunter [name] with outside transportation providers (taxi companies, [place], Catering manager.) to give Hunter [name] patients or other approved people the choice of on-demand rides [employer]") to our buildings for non-emergency visits.  By using Hunter [name], I, the person signing this document, on behalf of myself and/or any legal minors (in my care using the Hunter [name]), agree:  Science writer given to me are supplied by independent, outside transportation providers who do not work for, or have any affiliation with, Hunter [name]. [place] is not a transportation company. [place] has no control over the quality or safety of the rides I get using Hunter [name]. [place] has no control over whether any outside ride will happen on time or not. [place] gives no guarantee on the reliability, quality, safety, or availability on any rides, or that no mistakes will happen. I know and accept that traveling by vehicle (car, truck, SVU, Zenaida Niece, bus, taxi, etc.) has risks of  serious injuries such as disability, being paralyzed, and death. I know and agree the risk of using Hunter [name] is mine alone, and not [employer]. Transport Services are provided "as is" and as are available. The transportation providers are in charge for all inspections and care of the vehicles used to provide these rides. I agree not to take legal action against Hunter [name], its agents, employees, officers, directors, representatives, insurers, attorneys, assigns, successors, subsidiaries, and affiliates at any time for any reasons related directly or indirectly to using Hunter [name]. I also agree not to take legal action against Hunter [name] or its affiliates for any injury, death, or damage to property caused by or related to using Hunter [name]. I have read this Waiver and Release of Liability, and I understand the terms used in it and their legal meaning. This Waiver is freely and voluntarily given with the understanding that my right (or any legal minors) to legal action against Hunter [name] relating to Hunter [name] is knowingly given up to use these services.   I attest that I read the Ride Waiver and Release of Liability to Hunter [name], gave Hunter [name] the opportunity to ask questions and answered the questions asked (if any). I affirm that Hunter [name] then provided consent for assistance with transportation.     Hunter [name] Hunter [name] Hunter [name] [place]  [facility] Hunter [name]   [name]  ??  [phone]   Website: [email or web address]  [email or web address]

## 2022-11-18 NOTE — Telephone Encounter (Signed)
   Telephone encounter was:  Successful.  11/18/2022 Name: Hunter [name] MRN: [id number] DOB: 1933-08-03  Hunter [name] is a 89 y.o. year old male who is a primary care patient of [name], [name], MD . The community resource team was consulted for assistance with Transportation Needs   Care guide performed the following interventions: Spoke with [name] to confirm she has received message from [place]. [name] responded she has received the message.  Follow Up Plan:  No further follow up planned at this time. The patient has been provided with needed resources.  Great news! [facility] has accepted ride [id number] for [name] on 11/23/2022. There is no further action required at this time. If you have any questions, please call the [facility] support team at [phone] and select Option 1 OR email [email or web address]. Have a great day! [facility] Support Team Ride Details: Roundtrip A-Leg Ride ID: [id number] Passenger Name: Hunter [name] Passenger Phone: [phone] Ride Date: 11/23/2022 Daryll Drown Time: 12:20 PM (EDT) [facility] Address: [address] [place], [place] [address] Drop-off Address: [address], [place], [place], [place] Transportation Type: Standard Vehicle: Door-to-Door Investment banker, corporate: Patient uses a walker, friend Hunter [name] will be riding with patient. Pick-up: [address] [place] [address], [phone] Drop off: [address], [place] [address], [phone] B-Leg Ride ID: [id number] [facility] Time: 11:59 PM [facility] Address: [address] [place], [place], [place] Drop-off Address: [address] [place], [place] [address] Transportation Type: Standard Vehicle: Door-to-Door  Midwife   [facility] [employer] Care Guide   [email or web address]  ?? [phone]   Website: [email or web address]  [email or web address]

## 2022-11-19 ENCOUNTER — Encounter: Payer: Self-pay | Admitting: Nurse Practitioner

## 2022-11-19 ENCOUNTER — Ambulatory Visit: Payer: Medicare Other | Attending: Nurse Practitioner | Admitting: Nurse Practitioner

## 2022-11-19 VITALS — BP 123/72 | HR 74 | Ht 71.0 in | Wt 236.6 lb

## 2022-11-19 DIAGNOSIS — E782 Mixed hyperlipidemia: Secondary | ICD-10-CM | POA: Diagnosis not present

## 2022-11-19 DIAGNOSIS — Z950 Presence of cardiac pacemaker: Secondary | ICD-10-CM

## 2022-11-19 DIAGNOSIS — R600 Localized edema: Secondary | ICD-10-CM

## 2022-11-19 DIAGNOSIS — I5032 Chronic diastolic (congestive) heart failure: Secondary | ICD-10-CM

## 2022-11-19 DIAGNOSIS — I890 Lymphedema, not elsewhere classified: Secondary | ICD-10-CM | POA: Diagnosis not present

## 2022-11-19 DIAGNOSIS — M79673 Pain in unspecified foot: Secondary | ICD-10-CM | POA: Diagnosis not present

## 2022-11-19 DIAGNOSIS — I442 Atrioventricular block, complete: Secondary | ICD-10-CM

## 2022-11-19 DIAGNOSIS — R32 Unspecified urinary incontinence: Secondary | ICD-10-CM

## 2022-11-19 DIAGNOSIS — Z9181 History of falling: Secondary | ICD-10-CM | POA: Diagnosis not present

## 2022-11-19 DIAGNOSIS — N183 Chronic kidney disease, stage 3 unspecified: Secondary | ICD-10-CM

## 2022-11-19 DIAGNOSIS — Z79899 Other long term (current) drug therapy: Secondary | ICD-10-CM | POA: Diagnosis not present

## 2022-11-19 DIAGNOSIS — I10 Essential (primary) hypertension: Secondary | ICD-10-CM | POA: Diagnosis not present

## 2022-11-19 NOTE — Patient Instructions (Addendum)
Medication Instructions:   Continue all current medications.   Labwork:  FLP, LFT - orders given  Reminder:  Nothing to eat or drink after 12 midnight prior to labs. Office will contact with results via phone, letter or mychart.     Testing/Procedures:  none  Follow-Up:  6 months   Any Other Special Instructions Will Be Listed Below (If Applicable). You have been referred to:  Podiatry  If you need a refill on your cardiac medications before your next appointment, please call your pharmacy.

## 2022-11-19 NOTE — Progress Notes (Signed)
Cardiology Office Note:    Date:  11/19/2022  ID:  [name], DOB 06-Jan-1934, MRN [id number]  PCP:  Shelva Majestic, MD   Hunter [name] Cardiologist:  Dina Rich, MD Electrophysiologist:  Maurice Small, MD     Referring MD: Shelva Majestic, MD   CC:   History of Present Illness:    Hunter [name] is a 89 y.o. male with a hx of the following:  HFpEF CHB, s/p PPM Chronic left bundle branch block Hypertension Hyperlipidemia Chronic kidney disease stage III  Patient is a very pleasant 89 year old male with past medical history as mentioned above.  Last seen by Dr. Dina Rich on April 17, 2022.  Patient reported taking Lasix about 2-3 times per week, had some ongoing leg edema as well as ongoing shortness of breath that had progressed.  Weight was up 6 pounds since April 2023.  Was compliant with his medications.  He had some for signs of fluid overload, Dr. Dina Rich recommended better compliance of Lasix.  Was referred to lymphedema clinic.  Labs are normal.  Was told to follow-up in 6 months.  Patient came into the office this morning reporting chest pain and shortness of breath.  Nursing staff reported that symptoms were ongoing x 1 week, shortness of breath having ongoing for around 10 days, seemed to be getting worse.  Pain was located all across rib cage, he noted that he had been moving furniture for his wife.  Cough was reported as going on for 2 weeks, nonproductive.  Case was discussed with Dr. Wyline Mood and suggested office evaluation for today.  I last saw him on 09/01/2022. He denied chest pain but admitted to bilateral rib cage pain and along lateral sides of abdomen, admitted to shortness of breath noted recently. Denied any chest pain, palpitations, syncope, presyncope, dizziness, orthopnea, PND, significant weight changes, acute bleeding, or claudication. Also admitted to increased swelling along lower extremities recently.  Friend stated she believes his pain is due to the fact that his furniture is set low in his house. Denied tobacco use. Echo updated - report noted below.   Today he presents for follow-up with his friend. Says he fell after seeing me while at another office visit for another doctor. Said floor was slippery while using the bathroom, says he is prone to falling. Denies any injuries. Endorses chronic leg swelling, foot pain, and incontinence issues, has been ongoing for a while per his report. Denies any chest pain, shortness of breath, palpitations, syncope, presyncope, dizziness, orthopnea, PND, significant weight changes, acute bleeding, or claudication.   Past Medical History:  Diagnosis Date   Allergic rhinitis 02/02/2007   Anemia    Arthritis    "knees" (02/08/2017)   Chronic lower back pain    CKD (chronic kidney disease), stage III (HCC) 05/27/2011   Complete heart block 02/08/2017   Eczema    Essential hypertension 02/02/2007   Lasix 60 mg, losartan 50mg ,  Amlodipine 5mg --> 2.5 mg.  Usually have to repeat BP measurements  In the past-Maxzide 37.5-25mg .   GERD (gastroesophageal reflux disease) occasional   Hearing loss of both ears    Heart failure with preserved ejection fraction (HFpEF) 12/06/2013   History of skin cancer 03/26/2014   nose    Hx of echocardiogram    Echo (07/2013): Mild LVH, EF 55-60%, grade 1 diastolic dysfunction, mild LAE, PASP 23   Hyperlipidemia    Hypertension    Hypothyroidism    LBBB (  left bundle branch block)    Left patella fracture 2018   "no OR" (02/08/2017)   Major depression in partial remission 02/02/2007   Amitriptyline 25mg  for sleep (stop when runs out of #10 04/2018(, zoloft 100-->150mg --> 200mg    Mild neurocognitive disorder 06/21/2019   Neuropathy (HCC) 11/02/2011   Nocturia    Peripheral vascular disease (HCC) LOWER EXTREMITIES   Spinal stenosis in cervical region 03/21/2010   Squamous cell skin cancer, penis: glans (HCC) 05/2010   Initial  excision 11/11; recurrence, excision and laser Rx 9/13   Thrombocytopenia (HCC) 05/23/2012   Urinary frequency 09/05/2009   Possible BPH- see Artist Pais notes    Vitamin D deficiency 08/15/2008    Past Surgical History:  Procedure Laterality Date   CIRCUMCISION/ LASER DISSECTION PENILE GLANS CANCER  06-02-2010   CYSTOSCOPY WITH URETHRAL DILATATION  03/28/2012   Procedure: CYSTOSCOPY WITH URETHRAL DILATATION;  Surgeon: Kathi Ludwig, MD;  Location: [facility] ;  Service: Urology;  Laterality: N/A;  excision biopsy extensive meatal penile carcinoma meatoplasty   EXCISION RIGHT WRIST BENIGN TUMOR  2002 (APPROX)   hip surgery     INCISION AND DRAINAGE DEEP NECK ABSCESS     INGUINAL HERNIA REPAIR  1970's   KNEE ARTHROSCOPY Right 2017   LUMBAR LAMINECTOMY/DECOMPRESSION MICRODISCECTOMY N/A 05/24/2020   Procedure: OPEN LAMINECTOMY LUMBAR THREE-LUMBAR FOUR;  Surgeon: Bethann Goo, DO;  Location: [facility] OR;  Service: Neurosurgery;  Laterality: N/A;   PACEMAKER IMPLANT N/A 02/08/2017   Procedure: Pacemaker Implant;  Surgeon: Duke Salvia, MD;  Location: [facility] INVASIVE CV LAB;  Service: Cardiovascular;  Laterality: N/A;   PACEMAKER IMPLANT  02/08/2017   SJM Assurity MRI dual chamber PPM implanted by Dr Graciela Husbands for complete heart block   PENILE BX  05-09-2010   TONSILLECTOMY     TRANSURETHRAL RESECTION OF PROSTATE N/A 05/10/2015   Procedure: CYSTOSCOPY, URETHRAL MEATAL DILATION, TRANSURETHRAL RESECTION OF THE PROSTATE (TURP);  Surgeon: Jethro Bolus, MD;  Location: [facility];  Service: Urology;  Laterality: N/A;    Current Medications: Current Meds  Medication Sig   albuterol (VENTOLIN HFA) 108 (90 Base) MCG/ACT inhaler Inhale 2 puffs into the lungs every 6 (six) hours as needed.   amLODipine (NORVASC) 2.5 MG tablet TAKE 1 TABLET BY MOUTH DAILY   aspirin 81 MG chewable tablet Chew 81 mg by mouth daily.    atorvastatin (LIPITOR) 20 MG tablet TAKE 1 TABLET BY MOUTH DAILY   Cyanocobalamin  (B-12 PO) Take 1 tablet by mouth daily.   ferrous sulfate 325 (65 FE) MG tablet Take 325 mg by mouth daily with breakfast.   furosemide (LASIX) 20 MG tablet TAKE THREE TABLETS BY MOUTH DAILY   levothyroxine (SYNTHROID) 75 MCG tablet TAKE 1 TABLET BY MOUTH EVERY MORNING   losartan (COZAAR) 50 MG tablet TAKE 1 TABLET BY MOUTH DAILY   pantoprazole (PROTONIX) 40 MG tablet Take 1 tablet (40 mg total) by mouth daily.   sertraline (ZOLOFT) 100 MG tablet TAKE TWO TABLETS BY MOUTH DAILY   Vitamin D, Ergocalciferol, (DRISDOL) 1.25 MG (50000 UNIT) CAPS capsule TAKE 1 CAPSULE BY MOUTH EVERY 7  DAYS     Allergies:   Patient has no known allergies.   Social History   Socioeconomic History   Marital status: Divorced    Spouse name: Not on file   Number of children: 2   Years of education: 16   Highest education level: Bachelor's degree (e.g., BA, AB, BS)  Occupational History   Occupation: Retired  Tobacco Use   Smoking status: Never    Passive exposure: Never   Smokeless tobacco: Never   Tobacco comments:    Verified by Andrey Spearman  Vaping Use   Vaping Use: Never used  Substance and Sexual Activity   Alcohol use: Yes    Comment: 02/08/2017 "nothing in the 2000s"   Drug use: No   Sexual activity: Not Currently  Other Topics Concern   Not on file  Social History Narrative   Lives alone. Has a cat and a dog      Ambulates with cane      Right handed      Former IT trainer   Social Determinants of Health   Financial Resource Strain: Medium Risk (07/06/2021)   Overall Financial Resource Strain (CARDIA)    Difficulty of Paying Living Expenses: Somewhat hard  Food Insecurity: No Food Insecurity (05/05/2022)   Hunger Vital Sign    Worried About Running Out of Food in the Last Year: Never true    Ran Out of Food in the Last Year: Never true  Transportation Needs: Unmet Transportation Needs (08/20/2022)   PRAPARE - Transportation    Lack of Transportation (Medical): Yes    Lack of  Transportation (Non-Medical): Yes  Physical Activity: Inactive (07/06/2021)   Exercise Vital Sign    Days of Exercise per Week: 0 days    Minutes of Exercise per Session: 0 min  Stress: Stress Concern Present (07/06/2021)   [employer] of Occupational Health - Occupational Stress Questionnaire    Feeling of Stress : To some extent  Social Connections: Moderately Isolated (07/06/2021)   Social Connection and Isolation Panel [NHANES]    Frequency of Communication with Friends and Family: More than three times a week    Frequency of Social Gatherings with Friends and Family: More than three times a week    Attends Religious Services: More than 4 times per year    Active Member of [employer] or Organizations: No    Attends Engineer, structural: Never    Marital Status: Divorced     Family History: The patient's family history includes Arthritis in an other family member; Hyperlipidemia in an other family member; Hypertension in an other family member; Lung cancer in his mother; Prostate cancer in an other family member.  ROS:   Please see the history of present illness.     All other systems reviewed and are negative.  EKGs/Labs/Other Studies Reviewed:    The following studies were reviewed today:   EKG:  EKG is not ordered today.   Echo 11/2022:  1. Left ventricular ejection fraction, by estimation, is 50 to 55%. The  left ventricle has low normal function. The left ventricle has no regional  wall motion abnormalities. Left ventricular diastolic parameters are  consistent with Grade I diastolic  dysfunction (impaired relaxation). The average left ventricular global  longitudinal strain is -19.2 %. The global longitudinal strain is normal.   2. Right ventricular systolic function is normal. The right ventricular  size is normal. Tricuspid regurgitation signal is inadequate for assessing  PA pressure.   3. Left atrial size was upper normal.   4. The mitral valve is  grossly normal. Mild mitral valve regurgitation.   5. The aortic valve is tricuspid. Aortic valve regurgitation is not  visualized. No aortic stenosis is present. Aortic valve mean gradient  measures 5.0 mmHg.   6. The inferior vena cava is normal in size with greater than 50%  respiratory variability,  suggesting right atrial pressure of 3 mmHg.   Comparison(s): Prior images unable to be directly viewed, comparison made  by report only. Echocardiogram done 05/26/18 showed an EF of 50%.  Echocardiogram on 05/26/2018: Study Conclusions   - Left ventricle: The cavity size was normal. Wall thickness was    normal. The estimated ejection fraction was 50%. Septal motion    suggestive of LBBB or RV pacing. Doppler parameters are    consistent with abnormal left ventricular relaxation (grade 1    diastolic dysfunction).  - Aortic valve: Mildly calcified annulus. Trileaflet.  - Mitral valve: Mildly calcified annulus. There was mild    regurgitation.  - Right ventricle: Pacer wire or catheter noted in right ventricle.  - Right atrium: Central venous pressure (est): 3 mm Hg.  - Atrial septum: No defect or patent foramen ovale was identified.  - Tricuspid valve: There was trivial regurgitation.  - Pulmonary arteries: PA peak pressure: 24 mm Hg (S).  - Pericardium, extracardiac: There was no pericardial effusion.  Recent Labs: 04/17/2022: BUN 23; Creatinine, Ser 1.23; Magnesium 2.3; Potassium 4.3; Sodium 140  Recent Lipid Panel    Component Value Date/Time   CHOL 116 07/01/2021 1129   TRIG 117.0 07/01/2021 1129   TRIG 128 06/01/2006 1144   HDL 31.40 (L) 07/01/2021 1129   CHOLHDL 4 07/01/2021 1129   VLDL 23.4 07/01/2021 1129   LDLCALC 62 07/01/2021 1129   LDLCALC 120 (H) 02/09/2020 1627   LDLDIRECT 127.0 01/03/2019 1153    Physical Exam:    VS:  BP 123/72 (BP Location: Left Arm, Patient Position: Sitting, Cuff Size: Normal)   Pulse 74   Ht 5\' 11"  (1.803 m)   Wt 236 lb 9.6 oz (107.3  kg)   SpO2 95%   BMI 33.00 kg/m     Wt Readings from Last 3 Encounters:  11/19/22 236 lb 9.6 oz (107.3 kg)  10/16/22 236 lb 12.8 oz (107.4 kg)  09/01/22 241 lb 6.4 oz (109.5 kg)     GEN: Obese, 89 y.o. male in no acute distress HEENT: Normal NECK: No JVD; No carotid bruits CARDIAC: S1/S2, RRR, no murmurs, rubs, gallops; 2+ pulses RESPIRATORY:  Clear to auscultation without rales, wheezing or rhonchi  MUSCULOSKELETAL:  Nonpitting bilateral lower extremity edema/lymphedema (L>R) with evidence of pedal edema, skin scaling.  SKIN: Warm and dry Skin redness along bottom of feet along pressure points. NEUROLOGIC:  Alert and oriented x 3 PSYCHIATRIC:  Normal affect, distracted  ASSESSMENT:    1. Pain of foot, unspecified laterality   2. Medication management   3. Mixed hyperlipidemia     PLAN:    In order of problems listed above:  HFpEF, leg/pedal edema/lymphedema Stage C, Class II-III symptoms. Etiology unclear. TTE 11/2022 showed EF 50-55%. Weight is stable, but does note  lower extremity swelling (lymphedema on exam/pedal edema)- see PE above. Did suggest increasing Lasix dosage to improve leg edema, however pt declines due to incontinence issues - see below. Continue Lasix and losartan.  Low sodium diet, fluid restriction <2L, and daily weights encouraged. Educated to contact our office for weight gain of 2 lbs overnight or 5 lbs in one week. Will refer to home health.   CHB, s/p PPM Remote device check 10/2022 showed normal device function.  Continue to follow up with EP.   HTN BP stable. Discussed to monitor BP at home at least 2 hours after medications and sitting for 5-10 minutes. Continue current medication regimen. Heart healthy diet and regular  cardiovascular exercise encouraged.   HLD, medication management No recent lipid panel on file. Managed by PCP. Will obtain FLP and LFT to be done in 2-3 weeks. Continue atorvastatin. Heart healthy diet and regular cardiovascular  exercise encouraged.   CKD stage 3 Most recent sCr was 1.37 with eGFR 50. Avoid nephrotoxic agents. Encouraged adequate hydration. Continue to follow with PCP.  6. History of falls, urinary incontinence Says he is prone to falling, denies any injuries/fractures. Says urinary incontinence affects his ADL's. Discussed with patient that I strongly believe he would greatly benefit from [facility] RN/CNA, PT/OT. He verbalized understanding and is agreeable to this referral.   7. Foot pain Admits to foot pain while wearing shoes. Skin redness noted along bottom of feet along pressure points. Will refer to podiatry.   8. Disposition: Follow-up with Dr. Dina Rich or APP in 6 months or sooner if anything changes.   Medication Adjustments/Labs and Tests Ordered: Current medicines are reviewed at length with the patient today.  Concerns regarding medicines are outlined above.  Orders Placed This Encounter  Procedures   Lipid panel   Hepatic function panel   Ambulatory referral to Podiatry   No orders of the defined types were placed in this encounter.   Patient Instructions  Medication Instructions:   Continue all current medications.   Labwork:  FLP, LFT - orders given  Reminder:  Nothing to eat or drink after 12 midnight prior to labs. Office will contact with results via phone, letter or mychart.     Testing/Procedures:  none  Follow-Up:  6 months   Any Other Special Instructions Will Be Listed Below (If Applicable). You have been referred to:  Podiatry  If you need a refill on your cardiac medications before your next appointment, please call your pharmacy.    Signed, Sharlene Dory, NP  11/22/2022 2:01 PM    [facility]

## 2022-11-22 ENCOUNTER — Encounter: Payer: Self-pay | Admitting: Nurse Practitioner

## 2022-11-23 ENCOUNTER — Encounter (INDEPENDENT_AMBULATORY_CARE_PROVIDER_SITE_OTHER): Payer: Medicare Other | Admitting: Ophthalmology

## 2022-11-23 ENCOUNTER — Telehealth: Payer: Self-pay

## 2022-11-23 NOTE — Telephone Encounter (Signed)
   Telephone encounter was:  Unsuccessful.  11/23/2022 Name: Hunter [name] MRN: [id number] DOB: Aug 19, 1933  Unsuccessful outbound call made today to assist with:  Transportation Needs   Outreach Attempt:  1st Attempt  A HIPAA compliant voice message was left requesting a return call.  Instructed patient to call back .   Lenard Forth [facility] Guide, [facility] [phone] [address] [place], [place], [place] [address] Phone: [phone] Email: [email or web address]

## 2022-11-24 ENCOUNTER — Telehealth: Payer: Self-pay

## 2022-11-24 NOTE — Telephone Encounter (Signed)
Telephone encounter was:  Successful.  11/24/2022 Name: Hunter [name] MRN: [id number] DOB: 1933-10-14  Hunter [name] is a 89 y.o. year old male who is a primary care patient of [name], [name], MD . The community resource team was consulted for assistance with Transportation Needs   Care guide performed the following interventions: Patient provided with information about care guide support team and interviewed to confirm resource needs. Patient requested transportation for 11/27/2022  Follow Up Plan:  No further follow up planned at this time. The patient has been provided with needed resources.    Lenard Forth [facility] Guide, [facility] [phone] [address] [place], [place], [place] [address] Phone: [phone] Email: [email or web address]   Transportation Exception Form  * If the practice/clinic is more than 25 miles from the patient's address, please provide the following information:    Patients Name Hunter [name] Up Date   Patient DOB 09-22-1933 Practice Location   Patient MRN [id number]    Patient Home Address [address] [place] [address]      Description of Clinical Conditions Necessitating Transport          Electronic Signature of Requester: DANELL SISK  DATE:  11/24/2022     Hunter [name] DOB: [birth year] MRN: [id number]   RIDER WAIVER AND RELEASE OF LIABILITY  For purposes of improving physical access to our facilities, [facility] is pleased to partner with third parties to provide [facility] patients or other authorized individuals the option of convenient, on-demand ground transportation services (the Chiropractor") through use of the technology service that enables users to request on-demand ground transportation from independent third-party providers.  By opting to use and accept these [employer], I, the undersigned, hereby agree on behalf of myself, and on behalf of any minor child using  the Science writer for whom I am the parent or legal guardian, as follows:  Science writer provided to me are provided by independent third-party transportation providers who are not [employer] or employees and who are unaffiliated with [employer]. [facility] is neither a transportation carrier nor a common or public carrier. [facility] has no control over the quality or safety of the transportation that occurs as a result of the [employer]. [facility] cannot guarantee that any third-party transportation provider will complete any arranged transportation service. [facility] makes no representation, warranty, or guarantee regarding the reliability, timeliness, quality, safety, suitability, or availability of any of the Transport Services or that they will be error free. I fully understand that traveling by vehicle involves risks and dangers of serious bodily injury, including permanent disability, paralysis, and death. I agree, on behalf of myself and on behalf of any minor child using the Transport Services for whom I am the parent or legal guardian, that the entire risk arising out of my use of the [employer] remains solely with me, to the maximum extent permitted under applicable law. The [employer] are provided "as is" and "as available." [facility] disclaims all representations and warranties, express, implied or statutory, not expressly set out in these terms, including the implied warranties of merchantability and fitness for a particular purpose. I hereby waive and release [facility], its agents, employees, officers, directors, representatives, insurers, attorneys, assigns, successors, subsidiaries, and affiliates from any and all past, present, or future claims, demands, liabilities, actions, causes of action, or suits of any kind directly or indirectly arising from acceptance and  use of the [employer]. I further waive and release [facility]  and its affiliates from all present and future liability and responsibility for any injury or death to persons or damages to property caused by or related to the use of the [employer]. I have read this Waiver and Release of Liability, and I understand the terms used in it and their legal significance. This Waiver is freely and voluntarily given with the understanding that my right (as well as the right of any minor child for whom I am the parent or legal guardian using the [employer]) to legal recourse against [facility] in connection with the [employer] is knowingly surrendered in return for use of these services.   I attest that I read the consent document to Hunter [name], gave Mr. Weise the opportunity to ask questions and answered the questions asked (if any). I affirm that Hunter [name] then provided consent for he's participation in this program.     Hunter [name]

## 2022-11-25 ENCOUNTER — Other Ambulatory Visit: Payer: Self-pay | Admitting: Family Medicine

## 2022-11-25 ENCOUNTER — Telehealth: Payer: Self-pay | Admitting: Family Medicine

## 2022-11-25 DIAGNOSIS — Q246 Congenital heart block: Secondary | ICD-10-CM | POA: Diagnosis not present

## 2022-11-25 DIAGNOSIS — M6281 Muscle weakness (generalized): Secondary | ICD-10-CM | POA: Diagnosis not present

## 2022-11-25 DIAGNOSIS — M4802 Spinal stenosis, cervical region: Secondary | ICD-10-CM | POA: Diagnosis not present

## 2022-11-25 DIAGNOSIS — I130 Hypertensive heart and chronic kidney disease with heart failure and stage 1 through stage 4 chronic kidney disease, or unspecified chronic kidney disease: Secondary | ICD-10-CM | POA: Diagnosis not present

## 2022-11-25 DIAGNOSIS — G629 Polyneuropathy, unspecified: Secondary | ICD-10-CM | POA: Diagnosis not present

## 2022-11-25 DIAGNOSIS — G3184 Mild cognitive impairment, so stated: Secondary | ICD-10-CM | POA: Diagnosis not present

## 2022-11-25 DIAGNOSIS — H9193 Unspecified hearing loss, bilateral: Secondary | ICD-10-CM | POA: Diagnosis not present

## 2022-11-25 DIAGNOSIS — Z7982 Long term (current) use of aspirin: Secondary | ICD-10-CM | POA: Diagnosis not present

## 2022-11-25 DIAGNOSIS — D696 Thrombocytopenia, unspecified: Secondary | ICD-10-CM | POA: Diagnosis not present

## 2022-11-25 DIAGNOSIS — E039 Hypothyroidism, unspecified: Secondary | ICD-10-CM | POA: Diagnosis not present

## 2022-11-25 DIAGNOSIS — I5032 Chronic diastolic (congestive) heart failure: Secondary | ICD-10-CM | POA: Diagnosis not present

## 2022-11-25 DIAGNOSIS — R262 Difficulty in walking, not elsewhere classified: Secondary | ICD-10-CM | POA: Diagnosis not present

## 2022-11-25 DIAGNOSIS — G8929 Other chronic pain: Secondary | ICD-10-CM | POA: Diagnosis not present

## 2022-11-25 DIAGNOSIS — N183 Chronic kidney disease, stage 3 unspecified: Secondary | ICD-10-CM | POA: Diagnosis not present

## 2022-11-25 DIAGNOSIS — I890 Lymphedema, not elsewhere classified: Secondary | ICD-10-CM | POA: Diagnosis not present

## 2022-11-25 DIAGNOSIS — I739 Peripheral vascular disease, unspecified: Secondary | ICD-10-CM | POA: Diagnosis not present

## 2022-11-25 DIAGNOSIS — Z9181 History of falling: Secondary | ICD-10-CM | POA: Diagnosis not present

## 2022-11-25 DIAGNOSIS — I447 Left bundle-branch block, unspecified: Secondary | ICD-10-CM | POA: Diagnosis not present

## 2022-11-25 DIAGNOSIS — D631 Anemia in chronic kidney disease: Secondary | ICD-10-CM | POA: Diagnosis not present

## 2022-11-25 DIAGNOSIS — K219 Gastro-esophageal reflux disease without esophagitis: Secondary | ICD-10-CM | POA: Diagnosis not present

## 2022-11-25 DIAGNOSIS — E559 Vitamin D deficiency, unspecified: Secondary | ICD-10-CM | POA: Diagnosis not present

## 2022-11-25 NOTE — Telephone Encounter (Signed)
Contacted [name] to schedule their annual wellness visit. Appointment made for 11/26/2022.  Gabriel Cirri [facility] AWV TEAM Direct Dial [phone]

## 2022-11-26 ENCOUNTER — Ambulatory Visit (INDEPENDENT_AMBULATORY_CARE_PROVIDER_SITE_OTHER): Payer: Medicare Other

## 2022-11-26 VITALS — Wt 236.0 lb

## 2022-11-26 DIAGNOSIS — Z0000 Encounter for general adult medical examination without abnormal findings: Secondary | ICD-10-CM | POA: Diagnosis not present

## 2022-11-26 NOTE — Patient Instructions (Signed)
Hunter [name] , Thank you for taking time to come for your Medicare Wellness Visit. I appreciate your ongoing commitment to your health goals. Please review the following plan we discussed and let me know if I can assist you in the future.   These are the goals we discussed:  Goals      Heart Failure Management     Patient Goals/Self Care Activities: -Patient/Caregiver will self-administer medications as prescribed as evidenced by self-report/primary caregiver report  -Patient/Caregiver will attend all scheduled provider appointments as evidenced by clinician review of documented attendance to scheduled appointments and patient/caregiver report -Patient/Caregiver will call provider office for new concerns or questions as evidenced by review of documented incoming telephone call notes and patient report  -Weigh daily and record (notify MD with 3 lb weight gain over night or 5 lb in a week) -Adhere to low sodium diet              Increase physical activity        This is a list of the screening recommended for you and due dates:  Health Maintenance  Topic Date Due   Zoster (Shingles) Vaccine (1 of 2) Never done   COVID-19 Vaccine (3 - Pfizer risk series) 01/03/2020   Flu Shot  02/11/2023   Medicare Annual Wellness Visit  11/26/2023   DTaP/Tdap/Td vaccine (3 - Td or Tdap) 03/15/2029   Pneumonia Vaccine  Completed   HPV Vaccine  Aged Out    Advanced directives: Advance directive discussed with you today. Even though you declined this today please call our office should you change your mind and we can give you the proper paperwork for you to fill out.  Conditions/risks identified: walk better  Next appointment: Follow up in one year for your annual wellness visit.   Preventive Care 41 Years and Older, Male  Preventive care refers to lifestyle choices and visits with your health care provider that can promote health and wellness. What does preventive care include? A yearly  physical exam. This is also called an annual well check. Dental exams once or twice a year. Routine eye exams. Ask your health care provider how often you should have your eyes checked. Personal lifestyle choices, including: Daily care of your teeth and gums. Regular physical activity. Eating a healthy diet. Avoiding tobacco and drug use. Limiting alcohol use. Practicing safe sex. Taking low doses of aspirin every day. Taking vitamin and mineral supplements as recommended by your health care provider. What happens during an annual well check? The services and screenings done by your health care provider during your annual well check will depend on your age, overall health, lifestyle risk factors, and family history of disease. Counseling  Your health care provider may ask you questions about your: Alcohol use. Tobacco use. Drug use. Emotional well-being. Home and relationship well-being. Sexual activity. Eating habits. History of falls. Memory and ability to understand (cognition). Work and work Astronomer. Screening  You may have the following tests or measurements: Height, weight, and BMI. Blood pressure. Lipid and cholesterol levels. These may be checked every 5 years, or more frequently if you are over 45 years old. Skin check. Lung cancer screening. You may have this screening every year starting at age 55 if you have a 30-pack-year history of smoking and currently smoke or have quit within the past 15 years. Fecal occult blood test (FOBT) of the stool. You may have this test every year starting at age 94. Flexible sigmoidoscopy or colonoscopy. You  may have a sigmoidoscopy every 5 years or a colonoscopy every 10 years starting at age 62. Prostate cancer screening. Recommendations will vary depending on your family history and other risks. Hepatitis C blood test. Hepatitis B blood test. Sexually transmitted disease (STD) testing. Diabetes screening. This is done by  checking your blood sugar (glucose) after you have not eaten for a while (fasting). You may have this done every 1-3 years. Abdominal aortic aneurysm (AAA) screening. You may need this if you are a current or former smoker. Osteoporosis. You may be screened starting at age 109 if you are at high risk. Talk with your health care provider about your test results, treatment options, and if necessary, the need for more tests. Vaccines  Your health care provider may recommend certain vaccines, such as: Influenza vaccine. This is recommended every year. Tetanus, diphtheria, and acellular pertussis (Tdap, Td) vaccine. You may need a Td booster every 10 years. Zoster vaccine. You may need this after age 64. Pneumococcal 13-valent conjugate (PCV13) vaccine. One dose is recommended after age 37. Pneumococcal polysaccharide (PPSV23) vaccine. One dose is recommended after age 26. Talk to your health care provider about which screenings and vaccines you need and how often you need them. This information is not intended to replace advice given to you by your health care provider. Make sure you discuss any questions you have with your health care provider. Document Released: 07/26/2015 Document Revised: 03/18/2016 Document Reviewed: 04/30/2015 Elsevier Interactive Patient Education  2017 [employer].  Fall Prevention in the Home Falls can cause injuries. They can happen to people of all ages. There are many things you can do to make your home safe and to help prevent falls. What can I do on the outside of my home? Regularly fix the edges of walkways and driveways and fix any cracks. Remove anything that might make you trip as you walk through a door, such as a raised step or threshold. Trim any bushes or trees on the path to your home. Use bright outdoor lighting. Clear any walking paths of anything that might make someone trip, such as rocks or tools. Regularly check to see if handrails are loose or  broken. Make sure that both sides of any steps have handrails. Any raised decks and porches should have guardrails on the edges. Have any leaves, snow, or ice cleared regularly. Use sand or salt on walking paths during winter. Clean up any spills in your garage right away. This includes oil or grease spills. What can I do in the bathroom? Use night lights. Install grab bars by the toilet and in the tub and shower. Do not use towel bars as grab bars. Use non-skid mats or decals in the tub or shower. If you need to sit down in the shower, use a plastic, non-slip stool. Keep the floor dry. Clean up any water that spills on the floor as soon as it happens. Remove soap buildup in the tub or shower regularly. Attach bath mats securely with double-sided non-slip rug tape. Do not have throw rugs and other things on the floor that can make you trip. What can I do in the bedroom? Use night lights. Make sure that you have a light by your bed that is easy to reach. Do not use any sheets or blankets that are too big for your bed. They should not hang down onto the floor. Have a firm chair that has side arms. You can use this for support while you get  dressed. Do not have throw rugs and other things on the floor that can make you trip. What can I do in the kitchen? Clean up any spills right away. Avoid walking on wet floors. Keep items that you use a lot in easy-to-reach places. If you need to reach something above you, use a strong step stool that has a grab bar. Keep electrical cords out of the way. Do not use floor polish or wax that makes floors slippery. If you must use wax, use non-skid floor wax. Do not have throw rugs and other things on the floor that can make you trip. What can I do with my stairs? Do not leave any items on the stairs. Make sure that there are handrails on both sides of the stairs and use them. Fix handrails that are broken or loose. Make sure that handrails are as long as  the stairways. Check any carpeting to make sure that it is firmly attached to the stairs. Fix any carpet that is loose or worn. Avoid having throw rugs at the top or bottom of the stairs. If you do have throw rugs, attach them to the floor with carpet tape. Make sure that you have a light switch at the top of the stairs and the bottom of the stairs. If you do not have them, ask someone to add them for you. What else can I do to help prevent falls? Wear shoes that: Do not have high heels. Have rubber bottoms. Are comfortable and fit you well. Are closed at the toe. Do not wear sandals. If you use a stepladder: Make sure that it is fully opened. Do not climb a closed stepladder. Make sure that both sides of the stepladder are locked into place. Ask someone to hold it for you, if possible. Clearly mark and make sure that you can see: Any grab bars or handrails. First and last steps. Where the edge of each step is. Use tools that help you move around (mobility aids) if they are needed. These include: Canes. Walkers. Scooters. Crutches. Turn on the lights when you go into a dark area. Replace any light bulbs as soon as they burn out. Set up your furniture so you have a clear path. Avoid moving your furniture around. If any of your floors are uneven, fix them. If there are any pets around you, be aware of where they are. Review your medicines with your doctor. Some medicines can make you feel dizzy. This can increase your chance of falling. Ask your doctor what other things that you can do to help prevent falls. This information is not intended to replace advice given to you by your health care provider. Make sure you discuss any questions you have with your health care provider. Document Released: 04/25/2009 Document Revised: 12/05/2015 Document Reviewed: 08/03/2014 Elsevier Interactive Patient Education  2017 [employer].

## 2022-11-26 NOTE — Progress Notes (Addendum)
I connected with  Hunter [name] on 11/26/22 by a audio enabled telemedicine application and verified that I am speaking with the correct person using two identifiers.  Patient Location: Home  Provider Location: Office/Clinic  I discussed the limitations of evaluation and management by telemedicine. The patient expressed understanding and agreed to proceed.   Subjective:   Hunter [name] is a 89 y.o. male who presents for Medicare Annual/Subsequent preventive examination.  Review of Systems     Cardiac Risk Factors include: advanced age (>85men, >46 women);dyslipidemia;hypertension;sedentary lifestyle;obesity (BMI >30kg/m2);male gender     Objective:    Today's Vitals   11/26/22 1013  Weight: 236 lb (107 kg)   Body mass index is 32.92 kg/m.     11/26/2022   10:24 AM 07/06/2021   10:07 PM 06/17/2021   11:00 PM 06/17/2021    5:19 PM 05/24/2020    4:55 PM 05/24/2020   10:32 AM 04/30/2020    3:27 PM  Advanced Directives  Does Patient Have a Medical Advance Directive? No No No No No No No  Would patient like information on creating a medical advance directive? No - Patient declined No - Patient declined No - Patient declined No - Patient declined No - Patient declined No - Patient declined No - Patient declined    Current Medications (verified) Outpatient Encounter Medications as of 11/26/2022  Medication Sig   amLODipine (NORVASC) 2.5 MG tablet TAKE 1 TABLET BY MOUTH DAILY   aspirin 81 MG chewable tablet Chew 81 mg by mouth daily.    atorvastatin (LIPITOR) 20 MG tablet TAKE 1 TABLET BY MOUTH DAILY   Cyanocobalamin (B-12 PO) Take 1 tablet by mouth daily.   ferrous sulfate 325 (65 FE) MG tablet Take 325 mg by mouth daily with breakfast.   furosemide (LASIX) 20 MG tablet TAKE 3 TABLETS BY MOUTH DAILY   levothyroxine (SYNTHROID) 75 MCG tablet TAKE 1 TABLET BY MOUTH EVERY MORNING   losartan (COZAAR) 50 MG tablet TAKE 1 TABLET BY MOUTH DAILY   sertraline (ZOLOFT) 100 MG tablet  TAKE 2 TABLETS BY MOUTH DAILY   Vitamin D, Ergocalciferol, (DRISDOL) 1.25 MG (50000 UNIT) CAPS capsule TAKE 1 CAPSULE BY MOUTH EVERY 7  DAYS   pantoprazole (PROTONIX) 40 MG tablet Take 1 tablet (40 mg total) by mouth daily. (Patient not taking: Reported on 11/26/2022)   [DISCONTINUED] albuterol (VENTOLIN HFA) 108 (90 Base) MCG/ACT inhaler Inhale 2 puffs into the lungs every 6 (six) hours as needed. (Patient not taking: Reported on 11/26/2022)   No facility-administered encounter medications on file as of 11/26/2022.    Allergies (verified) Patient has no known allergies.   History: Past Medical History:  Diagnosis Date   Allergic rhinitis 02/02/2007   Anemia    Arthritis    "knees" (02/08/2017)   Chronic lower back pain    CKD (chronic kidney disease), stage III (HCC) 05/27/2011   Complete heart block 02/08/2017   Eczema    Essential hypertension 02/02/2007   Lasix 60 mg, losartan 50mg ,  Amlodipine 5mg --> 2.5 mg.  Usually have to repeat BP measurements  In the past-Maxzide 37.5-25mg .   GERD (gastroesophageal reflux disease) occasional   Hearing loss of both ears    Heart failure with preserved ejection fraction (HFpEF) 12/06/2013   History of skin cancer 03/26/2014   nose    Hx of echocardiogram    Echo (07/2013): Mild LVH, EF 55-60%, grade 1 diastolic dysfunction, mild LAE, PASP 23   Hyperlipidemia    Hypertension  Hypothyroidism    LBBB (left bundle branch block)    Left patella fracture 2018   "no OR" (02/08/2017)   Major depression in partial remission 02/02/2007   Amitriptyline 25mg  for sleep (stop when runs out of #10 04/2018(, zoloft 100-->150mg --> 200mg    Mild neurocognitive disorder 06/21/2019   Neuropathy (HCC) 11/02/2011   Nocturia    Peripheral vascular disease (HCC) LOWER EXTREMITIES   Spinal stenosis in cervical region 03/21/2010   Squamous cell skin cancer, penis: glans (HCC) 05/2010   Initial excision 11/11; recurrence, excision and laser Rx 9/13   Thrombocytopenia  (HCC) 05/23/2012   Urinary frequency 09/05/2009   Possible BPH- see Artist Pais notes    Vitamin D deficiency 08/15/2008   Past Surgical History:  Procedure Laterality Date   CIRCUMCISION/ LASER DISSECTION PENILE GLANS CANCER  06-02-2010   CYSTOSCOPY WITH URETHRAL DILATATION  03/28/2012   Procedure: CYSTOSCOPY WITH URETHRAL DILATATION;  Surgeon: Kathi Ludwig, MD;  Location: [facility] [place];  Service: Urology;  Laterality: N/A;  excision biopsy extensive meatal penile carcinoma meatoplasty   EXCISION RIGHT WRIST BENIGN TUMOR  2002 (APPROX)   hip surgery     INCISION AND DRAINAGE DEEP NECK ABSCESS     INGUINAL HERNIA REPAIR  1970's   KNEE ARTHROSCOPY Right 2017   LUMBAR LAMINECTOMY/DECOMPRESSION MICRODISCECTOMY N/A 05/24/2020   Procedure: OPEN LAMINECTOMY LUMBAR THREE-LUMBAR FOUR;  Surgeon: Bethann Goo, DO;  Location: [facility] OR;  Service: Neurosurgery;  Laterality: N/A;   PACEMAKER IMPLANT N/A 02/08/2017   Procedure: Pacemaker Implant;  Surgeon: Duke Salvia, MD;  Location: [facility] INV[facility]IVE CV LAB;  Service: Cardiovascular;  Laterality: N/A;   PACEMAKER IMPLANT  02/08/2017   SJM Assurity MRI dual chamber PPM implanted by Dr Graciela Husbands for complete heart block   PENILE BX  05-09-2010   TONSILLECTOMY     TRANSURETHRAL RESECTION OF PROSTATE N/A 05/10/2015   Procedure: CYSTOSCOPY, URETHRAL MEATAL DILATION, TRANSURETHRAL RESECTION OF THE PROSTATE (TURP);  Surgeon: Jethro Bolus, MD;  Location: [facility];  Service: Urology;  Laterality: N/A;   Family History  Problem Relation Age of Onset   Lung cancer Mother    Arthritis Other        family hx   Hyperlipidemia Other        family hx   Hypertension Other        family hx   Prostate cancer Other        family hx   Social History   Socioeconomic History   Marital status: Divorced    Spouse name: Not on file   Number of children: 2   Years of education: 16   Highest education level: Bachelor's degree (e.g., BA, AB, BS)   Occupational History   Occupation: Retired  Tobacco Use   Smoking status: Never    Passive exposure: Never   Smokeless tobacco: Never   Tobacco comments:    Verified by Andrey Spearman  Vaping Use   Vaping Use: Never used  Substance and Sexual Activity   Alcohol use: Yes    Comment: 02/08/2017 "nothing in the 2000s"   Drug use: No   Sexual activity: Not Currently  Other Topics Concern   Not on file  Social History Narrative   Lives alone. Has a cat and a dog      Ambulates with cane      Right handed      Former IT trainer   Social Determinants of Health   Financial Resource Strain: Low Risk  (11/26/2022)  Overall Financial Resource Strain (CARDIA)    Difficulty of Paying Living Expenses: Not hard at all  Food Insecurity: No Food Insecurity (11/26/2022)   Hunger Vital Sign    Worried About Running Out of Food in the Last Year: Never true    Ran Out of Food in the Last Year: Never true  Transportation Needs: No Transportation Needs (11/26/2022)   PRAPARE - Administrator, Civil Service (Medical): No    Lack of Transportation (Non-Medical): No  Physical Activity: Inactive (11/26/2022)   Exercise Vital Sign    Days of Exercise per Week: 0 days    Minutes of Exercise per Session: 0 min  Stress: Stress Concern Present (11/26/2022)   [employer] of Occupational Health - Occupational Stress Questionnaire    Feeling of Stress : To some extent  Social Connections: Socially Isolated (11/26/2022)   Social Connection and Isolation Panel [NHANES]    Frequency of Communication with Friends and Family: Twice a week    Frequency of Social Gatherings with Friends and Family: Once a week    Attends Religious Services: Never    Database administrator or Organizations: No    Attends Engineer, structural: Never    Marital Status: Divorced    Tobacco Counseling Counseling given: Not Answered Tobacco comments: Verified by Andrey Spearman   Clinical  Intake:  Pre-visit preparation completed: Yes  Pain : No/denies pain     BMI - recorded: 32.92 Nutritional Status: BMI > 30  Obese Nutritional Risks: None Diabetes: No  How often do you need to have someone help you when you read instructions, pamphlets, or other written materials from your doctor or pharmacy?: 1 - Never  Diabetic?no  Interpreter Needed?: No  Information entered by :: Lanier Ensign, LPN   Activities of Daily Living    11/26/2022   10:26 AM  In your present state of health, do you have any difficulty performing the following activities:  Hearing? 1  Comment hearing aid  Vision? 0  Difficulty concentrating or making decisions? 0  Walking or climbing stairs? 0  Dressing or bathing? 0  Doing errands, shopping? 0  Preparing Food and eating ? N  Using the Toilet? N  In the past six months, have you accidently leaked urine? Y  Comment incontinent  at times wears a brief  Do you have problems with loss of bowel control? Y  Comment at times  Managing your Medications? N  Managing your Finances? N  Housekeeping or managing your Housekeeping? N    Patient Care Team: Shelva Majestic, MD as PCP - General (Family Medicine) Wyline Mood, Dorothe Pea, MD as PCP - Cardiology (Cardiology) Mealor, Roberts Gaudy, MD as PCP - Electrophysiology (Cardiology) Jethro Bolus, MD (Inactive) as Consulting Physician (Urology) Jens Som Madolyn Frieze, MD as Consulting Physician (Cardiology) [facility] Cherlyn Roberts, MD as Consulting Physician (Dermatology) Fleeta Emmer, RN as [facility] Care Management  Indicate any recent Medical Services you may have received from other than [facility] providers in the past year (date may be approximate).     Assessment:   This is a routine wellness examination for Hakiem.  Hearing/Vision screen Hearing Screening - Comments:: Pt has hearing aids  Vision Screening - Comments:: Pt follows up with Dr  Ashley Royalty for annual eye exams   Dietary issues and exercise activities discussed: Current Exercise Habits: The patient does not participate in regular exercise at present   Goals Addressed  This Visit's Progress    Patient Stated       Walking better       Depression Screen    11/26/2022   10:26 AM 08/20/2022   11:05 AM 02/23/2022   11:27 AM 11/14/2021    3:04 PM 07/06/2021   10:00 PM 07/01/2021   10:10 AM 10/21/2020    8:25 AM  PHQ 2/9 Scores  PHQ - 2 Score 4  6 5  0 0 3  PHQ- 9 Score 8  10 6   12   Exception Documentation  Other- indicate reason in comment box   Medical reason    Not completed  Ophelia Shoulder does assessment   Verified by Andrey Spearman      Fall Risk    11/26/2022   10:25 AM 11/16/2022    9:58 AM 07/06/2021   10:05 PM 07/01/2021   10:11 AM 04/30/2020    3:29 PM  Fall Risk   Falls in the past year? 1 1 1 1 1   Number falls in past yr: 0 0 1 1 0  Injury with Fall? 1 0 1 0 1  Comment bruised and sore      Risk for fall due to : Impaired vision;Impaired mobility;Impaired balance/gait;History of fall(s) History of fall(s) History of fall(s);Impaired balance/gait;Impaired mobility;Mental status change;Medication side effect  Impaired balance/gait;Impaired mobility  Follow up Falls prevention discussed Falls prevention discussed Falls evaluation completed;Education provided;Falls prevention discussed  Education provided;Falls prevention discussed    FALL RISK PREVENTION PERTAINING TO THE HOME:  Any stairs in or around the home? No  If so, are there any without handrails? No  Home free of loose throw rugs in walkways, pet beds, electrical cords, etc? Yes  Adequate lighting in your home to reduce risk of falls? Yes   ASSISTIVE DEVICES UTILIZED TO PREVENT FALLS:  Life alert? No  Use of a cane, walker or w/c? Yes  Grab bars in the bathroom? Yes  Shower chair or bench in shower? Yes  Elevated toilet seat or a handicapped toilet? No   TIMED UP AND  GO:  Was the test performed? No .   Cognitive Function:    05/19/2018    4:31 PM 05/19/2018    4:28 PM 05/19/2018    4:23 PM 12/28/2016    2:59 PM  MMSE - Mini Mental State Exam  Orientation to time   4 5  Orientation to Place   5 5  Registration   3 3  Attention/ Calculation   5 5  Recall   3 3  Language- name 2 objects   2 2  Language- repeat   1 1  Language- follow 3 step command  3  3  Language- read & follow direction  1  1  Write a sentence 1 1  1   Copy design 1 1  1   Total score    30      02/24/2019    1:00 PM  Montreal Cognitive Assessment   Visuospatial/ Executive (0/5) 2  Naming (0/3) 2  Attention: Read list of digits (0/2) 1  Attention: Read list of letters (0/1) 0  Attention: Serial 7 subtraction starting at 100 (0/3) 2  Language: Repeat phrase (0/2) 1  Language : Fluency (0/1) 0  Abstraction (0/2) 2  Delayed Recall (0/5) 0  Orientation (0/6) 5  Total 15      11/26/2022   10:34 AM  6CIT Screen  What Year? 0 points  What month? 0 points  What  time? 0 points  Count back from 20 0 points  Months in reverse 0 points  Repeat phrase 4 points  Total Score 4 points    Immunizations Immunization History  Administered Date(s) Administered   Fluad Quad(high Dose 65+) 07/01/2021   Influenza Whole 07/13/2005   Influenza, High Dose Seasonal PF 04/03/2016, 05/03/2018   Influenza,inj,Quad PF,6+ Mos 05/11/2015, 05/02/2019   PFIZER(Purple Top)SARS-COV-2 Vaccination 11/14/2019, 12/06/2019   Pneumococcal Conjugate-13 04/03/2016   Pneumococcal Polysaccharide-23 08/15/2008   Td 08/15/2008   Tdap 03/16/2019   Zoster, Live 05/23/2012    TDAP status: Up to date  Flu Vaccine status: Due, Education has been provided regarding the importance of this vaccine. Advised may receive this vaccine at local pharmacy or Health Dept. Aware to provide a copy of the vaccination record if obtained from local pharmacy or Health Dept. Verbalized acceptance and  understanding.  Pneumococcal vaccine status: Up to date  Covid-19 vaccine status: Completed vaccines  Qualifies for Shingles Vaccine? Yes   Zostavax completed No   Shingrix Completed?: No.    Education has been provided regarding the importance of this vaccine. Patient has been advised to call insurance company to determine out of pocket expense if they have not yet received this vaccine. Advised may also receive vaccine at local pharmacy or Health Dept. Verbalized acceptance and understanding.  Screening Tests Health Maintenance  Topic Date Due   Zoster Vaccines- Shingrix (1 of 2) Never done   COVID-19 Vaccine (3 - Pfizer risk series) 01/03/2020   INFLUENZA VACCINE  02/11/2023   Medicare Annual Wellness (AWV)  11/26/2023   DTaP/Tdap/Td (3 - Td or Tdap) 03/15/2029   Pneumonia Vaccine 71+ Years old  Completed   HPV VACCINES  Aged Out    Health Maintenance  Health Maintenance Due  Topic Date Due   Zoster Vaccines- Shingrix (1 of 2) Never done   COVID-19 Vaccine (3 - Pfizer risk series) 01/03/2020    Colorectal cancer screening: No longer required.    Additional Screening:  Vision Screening: Recommended annual ophthalmology exams for early detection of glaucoma and other disorders of the eye. Is the patient up to date with their annual eye exam?  Yes  Who is the provider or what is the name of the office in which the patient attends annual eye exams? Dr Ashley Royalty  If pt is not established with a provider, would they like to be referred to a provider to establish care? no.   Dental Screening: Recommended annual dental exams for proper oral hygiene  Community Resource Referral / Chronic Care Management: CRR required this visit?  No   CCM required this visit?  No      Plan:     I have personally reviewed and noted the following in the patient's chart:   Medical and social history Use of alcohol, tobacco or illicit drugs  Current medications and supplements including  opioid prescriptions. Patient is not currently taking opioid prescriptions. Functional ability and status Nutritional status Physical activity Advanced directives List of other physicians Hospitalizations, surgeries, and ER visits in previous 12 months Vitals Screenings to include cognitive, depression, and falls Referrals and appointments  In addition, I have reviewed and discussed with patient certain preventive protocols, quality metrics, and best practice recommendations. A written personalized care plan for preventive services as well as general preventive health recommendations were provided to patient.     Marzella Schlein, LPN   1/61/0960   Nurse Notes: none

## 2022-11-27 ENCOUNTER — Encounter (INDEPENDENT_AMBULATORY_CARE_PROVIDER_SITE_OTHER): Payer: Medicare Other | Admitting: Ophthalmology

## 2022-11-27 DIAGNOSIS — E039 Hypothyroidism, unspecified: Secondary | ICD-10-CM | POA: Diagnosis not present

## 2022-11-27 DIAGNOSIS — G629 Polyneuropathy, unspecified: Secondary | ICD-10-CM | POA: Diagnosis not present

## 2022-11-27 DIAGNOSIS — H353231 Exudative age-related macular degeneration, bilateral, with active choroidal neovascularization: Secondary | ICD-10-CM

## 2022-11-27 DIAGNOSIS — Q246 Congenital heart block: Secondary | ICD-10-CM | POA: Diagnosis not present

## 2022-11-27 DIAGNOSIS — N183 Chronic kidney disease, stage 3 unspecified: Secondary | ICD-10-CM | POA: Diagnosis not present

## 2022-11-27 DIAGNOSIS — I739 Peripheral vascular disease, unspecified: Secondary | ICD-10-CM | POA: Diagnosis not present

## 2022-11-27 DIAGNOSIS — H9193 Unspecified hearing loss, bilateral: Secondary | ICD-10-CM | POA: Diagnosis not present

## 2022-11-27 DIAGNOSIS — I447 Left bundle-branch block, unspecified: Secondary | ICD-10-CM | POA: Diagnosis not present

## 2022-11-27 DIAGNOSIS — H2511 Age-related nuclear cataract, right eye: Secondary | ICD-10-CM | POA: Diagnosis not present

## 2022-11-27 DIAGNOSIS — R262 Difficulty in walking, not elsewhere classified: Secondary | ICD-10-CM | POA: Diagnosis not present

## 2022-11-27 DIAGNOSIS — K219 Gastro-esophageal reflux disease without esophagitis: Secondary | ICD-10-CM | POA: Diagnosis not present

## 2022-11-27 DIAGNOSIS — H35033 Hypertensive retinopathy, bilateral: Secondary | ICD-10-CM | POA: Diagnosis not present

## 2022-11-27 DIAGNOSIS — H43813 Vitreous degeneration, bilateral: Secondary | ICD-10-CM

## 2022-11-27 DIAGNOSIS — D696 Thrombocytopenia, unspecified: Secondary | ICD-10-CM | POA: Diagnosis not present

## 2022-11-27 DIAGNOSIS — D631 Anemia in chronic kidney disease: Secondary | ICD-10-CM | POA: Diagnosis not present

## 2022-11-27 DIAGNOSIS — I10 Essential (primary) hypertension: Secondary | ICD-10-CM

## 2022-11-27 DIAGNOSIS — Z7982 Long term (current) use of aspirin: Secondary | ICD-10-CM | POA: Diagnosis not present

## 2022-11-27 DIAGNOSIS — G8929 Other chronic pain: Secondary | ICD-10-CM | POA: Diagnosis not present

## 2022-11-27 DIAGNOSIS — I130 Hypertensive heart and chronic kidney disease with heart failure and stage 1 through stage 4 chronic kidney disease, or unspecified chronic kidney disease: Secondary | ICD-10-CM | POA: Diagnosis not present

## 2022-11-27 DIAGNOSIS — M6281 Muscle weakness (generalized): Secondary | ICD-10-CM | POA: Diagnosis not present

## 2022-11-27 DIAGNOSIS — M4802 Spinal stenosis, cervical region: Secondary | ICD-10-CM | POA: Diagnosis not present

## 2022-11-27 DIAGNOSIS — G3184 Mild cognitive impairment, so stated: Secondary | ICD-10-CM | POA: Diagnosis not present

## 2022-11-27 DIAGNOSIS — I5032 Chronic diastolic (congestive) heart failure: Secondary | ICD-10-CM | POA: Diagnosis not present

## 2022-11-27 DIAGNOSIS — I890 Lymphedema, not elsewhere classified: Secondary | ICD-10-CM | POA: Diagnosis not present

## 2022-11-27 DIAGNOSIS — E559 Vitamin D deficiency, unspecified: Secondary | ICD-10-CM | POA: Diagnosis not present

## 2022-11-27 DIAGNOSIS — Z9181 History of falling: Secondary | ICD-10-CM | POA: Diagnosis not present

## 2022-11-27 NOTE — Progress Notes (Signed)
Remote pacemaker transmission.   

## 2022-12-01 DIAGNOSIS — K219 Gastro-esophageal reflux disease without esophagitis: Secondary | ICD-10-CM | POA: Diagnosis not present

## 2022-12-01 DIAGNOSIS — M6281 Muscle weakness (generalized): Secondary | ICD-10-CM | POA: Diagnosis not present

## 2022-12-01 DIAGNOSIS — E039 Hypothyroidism, unspecified: Secondary | ICD-10-CM | POA: Diagnosis not present

## 2022-12-01 DIAGNOSIS — R262 Difficulty in walking, not elsewhere classified: Secondary | ICD-10-CM | POA: Diagnosis not present

## 2022-12-01 DIAGNOSIS — D696 Thrombocytopenia, unspecified: Secondary | ICD-10-CM | POA: Diagnosis not present

## 2022-12-01 DIAGNOSIS — I890 Lymphedema, not elsewhere classified: Secondary | ICD-10-CM | POA: Diagnosis not present

## 2022-12-01 DIAGNOSIS — G629 Polyneuropathy, unspecified: Secondary | ICD-10-CM | POA: Diagnosis not present

## 2022-12-01 DIAGNOSIS — Q246 Congenital heart block: Secondary | ICD-10-CM | POA: Diagnosis not present

## 2022-12-01 DIAGNOSIS — I447 Left bundle-branch block, unspecified: Secondary | ICD-10-CM | POA: Diagnosis not present

## 2022-12-01 DIAGNOSIS — N183 Chronic kidney disease, stage 3 unspecified: Secondary | ICD-10-CM | POA: Diagnosis not present

## 2022-12-01 DIAGNOSIS — D631 Anemia in chronic kidney disease: Secondary | ICD-10-CM | POA: Diagnosis not present

## 2022-12-01 DIAGNOSIS — G8929 Other chronic pain: Secondary | ICD-10-CM | POA: Diagnosis not present

## 2022-12-01 DIAGNOSIS — Z9181 History of falling: Secondary | ICD-10-CM | POA: Diagnosis not present

## 2022-12-01 DIAGNOSIS — Z7982 Long term (current) use of aspirin: Secondary | ICD-10-CM | POA: Diagnosis not present

## 2022-12-01 DIAGNOSIS — M4802 Spinal stenosis, cervical region: Secondary | ICD-10-CM | POA: Diagnosis not present

## 2022-12-01 DIAGNOSIS — I5032 Chronic diastolic (congestive) heart failure: Secondary | ICD-10-CM | POA: Diagnosis not present

## 2022-12-01 DIAGNOSIS — H9193 Unspecified hearing loss, bilateral: Secondary | ICD-10-CM | POA: Diagnosis not present

## 2022-12-01 DIAGNOSIS — I130 Hypertensive heart and chronic kidney disease with heart failure and stage 1 through stage 4 chronic kidney disease, or unspecified chronic kidney disease: Secondary | ICD-10-CM | POA: Diagnosis not present

## 2022-12-01 DIAGNOSIS — I739 Peripheral vascular disease, unspecified: Secondary | ICD-10-CM | POA: Diagnosis not present

## 2022-12-01 DIAGNOSIS — G3184 Mild cognitive impairment, so stated: Secondary | ICD-10-CM | POA: Diagnosis not present

## 2022-12-01 DIAGNOSIS — E559 Vitamin D deficiency, unspecified: Secondary | ICD-10-CM | POA: Diagnosis not present

## 2022-12-02 DIAGNOSIS — I447 Left bundle-branch block, unspecified: Secondary | ICD-10-CM | POA: Diagnosis not present

## 2022-12-02 DIAGNOSIS — D696 Thrombocytopenia, unspecified: Secondary | ICD-10-CM | POA: Diagnosis not present

## 2022-12-02 DIAGNOSIS — G3184 Mild cognitive impairment, so stated: Secondary | ICD-10-CM | POA: Diagnosis not present

## 2022-12-02 DIAGNOSIS — M4802 Spinal stenosis, cervical region: Secondary | ICD-10-CM | POA: Diagnosis not present

## 2022-12-02 DIAGNOSIS — I5032 Chronic diastolic (congestive) heart failure: Secondary | ICD-10-CM | POA: Diagnosis not present

## 2022-12-02 DIAGNOSIS — Z9181 History of falling: Secondary | ICD-10-CM | POA: Diagnosis not present

## 2022-12-02 DIAGNOSIS — I739 Peripheral vascular disease, unspecified: Secondary | ICD-10-CM | POA: Diagnosis not present

## 2022-12-02 DIAGNOSIS — G629 Polyneuropathy, unspecified: Secondary | ICD-10-CM | POA: Diagnosis not present

## 2022-12-02 DIAGNOSIS — K219 Gastro-esophageal reflux disease without esophagitis: Secondary | ICD-10-CM | POA: Diagnosis not present

## 2022-12-02 DIAGNOSIS — I890 Lymphedema, not elsewhere classified: Secondary | ICD-10-CM | POA: Diagnosis not present

## 2022-12-02 DIAGNOSIS — I130 Hypertensive heart and chronic kidney disease with heart failure and stage 1 through stage 4 chronic kidney disease, or unspecified chronic kidney disease: Secondary | ICD-10-CM | POA: Diagnosis not present

## 2022-12-02 DIAGNOSIS — H9193 Unspecified hearing loss, bilateral: Secondary | ICD-10-CM | POA: Diagnosis not present

## 2022-12-02 DIAGNOSIS — R262 Difficulty in walking, not elsewhere classified: Secondary | ICD-10-CM | POA: Diagnosis not present

## 2022-12-02 DIAGNOSIS — Z7982 Long term (current) use of aspirin: Secondary | ICD-10-CM | POA: Diagnosis not present

## 2022-12-02 DIAGNOSIS — D631 Anemia in chronic kidney disease: Secondary | ICD-10-CM | POA: Diagnosis not present

## 2022-12-02 DIAGNOSIS — N183 Chronic kidney disease, stage 3 unspecified: Secondary | ICD-10-CM | POA: Diagnosis not present

## 2022-12-02 DIAGNOSIS — M6281 Muscle weakness (generalized): Secondary | ICD-10-CM | POA: Diagnosis not present

## 2022-12-02 DIAGNOSIS — Q246 Congenital heart block: Secondary | ICD-10-CM | POA: Diagnosis not present

## 2022-12-02 DIAGNOSIS — G8929 Other chronic pain: Secondary | ICD-10-CM | POA: Diagnosis not present

## 2022-12-02 DIAGNOSIS — E039 Hypothyroidism, unspecified: Secondary | ICD-10-CM | POA: Diagnosis not present

## 2022-12-02 DIAGNOSIS — E559 Vitamin D deficiency, unspecified: Secondary | ICD-10-CM | POA: Diagnosis not present

## 2022-12-03 DIAGNOSIS — N183 Chronic kidney disease, stage 3 unspecified: Secondary | ICD-10-CM | POA: Diagnosis not present

## 2022-12-03 DIAGNOSIS — I5032 Chronic diastolic (congestive) heart failure: Secondary | ICD-10-CM | POA: Diagnosis not present

## 2022-12-03 DIAGNOSIS — G8929 Other chronic pain: Secondary | ICD-10-CM | POA: Diagnosis not present

## 2022-12-03 DIAGNOSIS — K219 Gastro-esophageal reflux disease without esophagitis: Secondary | ICD-10-CM | POA: Diagnosis not present

## 2022-12-03 DIAGNOSIS — Q246 Congenital heart block: Secondary | ICD-10-CM | POA: Diagnosis not present

## 2022-12-03 DIAGNOSIS — E039 Hypothyroidism, unspecified: Secondary | ICD-10-CM | POA: Diagnosis not present

## 2022-12-03 DIAGNOSIS — E559 Vitamin D deficiency, unspecified: Secondary | ICD-10-CM | POA: Diagnosis not present

## 2022-12-03 DIAGNOSIS — R262 Difficulty in walking, not elsewhere classified: Secondary | ICD-10-CM | POA: Diagnosis not present

## 2022-12-03 DIAGNOSIS — D696 Thrombocytopenia, unspecified: Secondary | ICD-10-CM | POA: Diagnosis not present

## 2022-12-03 DIAGNOSIS — D631 Anemia in chronic kidney disease: Secondary | ICD-10-CM | POA: Diagnosis not present

## 2022-12-03 DIAGNOSIS — R0602 Shortness of breath: Secondary | ICD-10-CM | POA: Diagnosis not present

## 2022-12-03 DIAGNOSIS — Z79899 Other long term (current) drug therapy: Secondary | ICD-10-CM | POA: Diagnosis not present

## 2022-12-03 DIAGNOSIS — G629 Polyneuropathy, unspecified: Secondary | ICD-10-CM | POA: Diagnosis not present

## 2022-12-03 DIAGNOSIS — I890 Lymphedema, not elsewhere classified: Secondary | ICD-10-CM | POA: Diagnosis not present

## 2022-12-03 DIAGNOSIS — I739 Peripheral vascular disease, unspecified: Secondary | ICD-10-CM | POA: Diagnosis not present

## 2022-12-03 DIAGNOSIS — G3184 Mild cognitive impairment, so stated: Secondary | ICD-10-CM | POA: Diagnosis not present

## 2022-12-03 DIAGNOSIS — M6281 Muscle weakness (generalized): Secondary | ICD-10-CM | POA: Diagnosis not present

## 2022-12-03 DIAGNOSIS — I447 Left bundle-branch block, unspecified: Secondary | ICD-10-CM | POA: Diagnosis not present

## 2022-12-03 DIAGNOSIS — H9193 Unspecified hearing loss, bilateral: Secondary | ICD-10-CM | POA: Diagnosis not present

## 2022-12-03 DIAGNOSIS — Z9181 History of falling: Secondary | ICD-10-CM | POA: Diagnosis not present

## 2022-12-03 DIAGNOSIS — I130 Hypertensive heart and chronic kidney disease with heart failure and stage 1 through stage 4 chronic kidney disease, or unspecified chronic kidney disease: Secondary | ICD-10-CM | POA: Diagnosis not present

## 2022-12-03 DIAGNOSIS — Z7982 Long term (current) use of aspirin: Secondary | ICD-10-CM | POA: Diagnosis not present

## 2022-12-03 DIAGNOSIS — M4802 Spinal stenosis, cervical region: Secondary | ICD-10-CM | POA: Diagnosis not present

## 2022-12-04 ENCOUNTER — Encounter: Payer: Self-pay | Admitting: Nurse Practitioner

## 2022-12-04 DIAGNOSIS — E039 Hypothyroidism, unspecified: Secondary | ICD-10-CM | POA: Diagnosis not present

## 2022-12-04 DIAGNOSIS — H9193 Unspecified hearing loss, bilateral: Secondary | ICD-10-CM | POA: Diagnosis not present

## 2022-12-04 DIAGNOSIS — I890 Lymphedema, not elsewhere classified: Secondary | ICD-10-CM | POA: Diagnosis not present

## 2022-12-04 DIAGNOSIS — G3184 Mild cognitive impairment, so stated: Secondary | ICD-10-CM | POA: Diagnosis not present

## 2022-12-04 DIAGNOSIS — E559 Vitamin D deficiency, unspecified: Secondary | ICD-10-CM | POA: Diagnosis not present

## 2022-12-04 DIAGNOSIS — Q246 Congenital heart block: Secondary | ICD-10-CM | POA: Diagnosis not present

## 2022-12-04 DIAGNOSIS — N183 Chronic kidney disease, stage 3 unspecified: Secondary | ICD-10-CM | POA: Diagnosis not present

## 2022-12-04 DIAGNOSIS — G629 Polyneuropathy, unspecified: Secondary | ICD-10-CM | POA: Diagnosis not present

## 2022-12-04 DIAGNOSIS — Z7982 Long term (current) use of aspirin: Secondary | ICD-10-CM | POA: Diagnosis not present

## 2022-12-04 DIAGNOSIS — K219 Gastro-esophageal reflux disease without esophagitis: Secondary | ICD-10-CM | POA: Diagnosis not present

## 2022-12-04 DIAGNOSIS — I5032 Chronic diastolic (congestive) heart failure: Secondary | ICD-10-CM | POA: Diagnosis not present

## 2022-12-04 DIAGNOSIS — D696 Thrombocytopenia, unspecified: Secondary | ICD-10-CM | POA: Diagnosis not present

## 2022-12-04 DIAGNOSIS — R262 Difficulty in walking, not elsewhere classified: Secondary | ICD-10-CM | POA: Diagnosis not present

## 2022-12-04 DIAGNOSIS — M4802 Spinal stenosis, cervical region: Secondary | ICD-10-CM | POA: Diagnosis not present

## 2022-12-04 DIAGNOSIS — I130 Hypertensive heart and chronic kidney disease with heart failure and stage 1 through stage 4 chronic kidney disease, or unspecified chronic kidney disease: Secondary | ICD-10-CM | POA: Diagnosis not present

## 2022-12-04 DIAGNOSIS — G8929 Other chronic pain: Secondary | ICD-10-CM | POA: Diagnosis not present

## 2022-12-04 DIAGNOSIS — Z9181 History of falling: Secondary | ICD-10-CM | POA: Diagnosis not present

## 2022-12-04 DIAGNOSIS — I447 Left bundle-branch block, unspecified: Secondary | ICD-10-CM | POA: Diagnosis not present

## 2022-12-04 DIAGNOSIS — I739 Peripheral vascular disease, unspecified: Secondary | ICD-10-CM | POA: Diagnosis not present

## 2022-12-04 DIAGNOSIS — M6281 Muscle weakness (generalized): Secondary | ICD-10-CM | POA: Diagnosis not present

## 2022-12-04 DIAGNOSIS — D631 Anemia in chronic kidney disease: Secondary | ICD-10-CM | POA: Diagnosis not present

## 2022-12-08 DIAGNOSIS — E559 Vitamin D deficiency, unspecified: Secondary | ICD-10-CM | POA: Diagnosis not present

## 2022-12-08 DIAGNOSIS — K219 Gastro-esophageal reflux disease without esophagitis: Secondary | ICD-10-CM | POA: Diagnosis not present

## 2022-12-08 DIAGNOSIS — G3184 Mild cognitive impairment, so stated: Secondary | ICD-10-CM | POA: Diagnosis not present

## 2022-12-08 DIAGNOSIS — D631 Anemia in chronic kidney disease: Secondary | ICD-10-CM | POA: Diagnosis not present

## 2022-12-08 DIAGNOSIS — I890 Lymphedema, not elsewhere classified: Secondary | ICD-10-CM | POA: Diagnosis not present

## 2022-12-08 DIAGNOSIS — M4802 Spinal stenosis, cervical region: Secondary | ICD-10-CM | POA: Diagnosis not present

## 2022-12-08 DIAGNOSIS — Q246 Congenital heart block: Secondary | ICD-10-CM | POA: Diagnosis not present

## 2022-12-08 DIAGNOSIS — I130 Hypertensive heart and chronic kidney disease with heart failure and stage 1 through stage 4 chronic kidney disease, or unspecified chronic kidney disease: Secondary | ICD-10-CM | POA: Diagnosis not present

## 2022-12-08 DIAGNOSIS — Z9181 History of falling: Secondary | ICD-10-CM | POA: Diagnosis not present

## 2022-12-08 DIAGNOSIS — H9193 Unspecified hearing loss, bilateral: Secondary | ICD-10-CM | POA: Diagnosis not present

## 2022-12-08 DIAGNOSIS — I447 Left bundle-branch block, unspecified: Secondary | ICD-10-CM | POA: Diagnosis not present

## 2022-12-08 DIAGNOSIS — R262 Difficulty in walking, not elsewhere classified: Secondary | ICD-10-CM | POA: Diagnosis not present

## 2022-12-08 DIAGNOSIS — E039 Hypothyroidism, unspecified: Secondary | ICD-10-CM | POA: Diagnosis not present

## 2022-12-08 DIAGNOSIS — G8929 Other chronic pain: Secondary | ICD-10-CM | POA: Diagnosis not present

## 2022-12-08 DIAGNOSIS — G629 Polyneuropathy, unspecified: Secondary | ICD-10-CM | POA: Diagnosis not present

## 2022-12-08 DIAGNOSIS — D696 Thrombocytopenia, unspecified: Secondary | ICD-10-CM | POA: Diagnosis not present

## 2022-12-08 DIAGNOSIS — I739 Peripheral vascular disease, unspecified: Secondary | ICD-10-CM | POA: Diagnosis not present

## 2022-12-08 DIAGNOSIS — M6281 Muscle weakness (generalized): Secondary | ICD-10-CM | POA: Diagnosis not present

## 2022-12-08 DIAGNOSIS — Z7982 Long term (current) use of aspirin: Secondary | ICD-10-CM | POA: Diagnosis not present

## 2022-12-08 DIAGNOSIS — I5032 Chronic diastolic (congestive) heart failure: Secondary | ICD-10-CM | POA: Diagnosis not present

## 2022-12-08 DIAGNOSIS — N183 Chronic kidney disease, stage 3 unspecified: Secondary | ICD-10-CM | POA: Diagnosis not present

## 2022-12-09 ENCOUNTER — Other Ambulatory Visit: Payer: Self-pay | Admitting: Family Medicine

## 2022-12-09 ENCOUNTER — Telehealth: Payer: Self-pay | Admitting: Nurse Practitioner

## 2022-12-09 DIAGNOSIS — E559 Vitamin D deficiency, unspecified: Secondary | ICD-10-CM | POA: Diagnosis not present

## 2022-12-09 DIAGNOSIS — G629 Polyneuropathy, unspecified: Secondary | ICD-10-CM | POA: Diagnosis not present

## 2022-12-09 DIAGNOSIS — M4802 Spinal stenosis, cervical region: Secondary | ICD-10-CM | POA: Diagnosis not present

## 2022-12-09 DIAGNOSIS — I130 Hypertensive heart and chronic kidney disease with heart failure and stage 1 through stage 4 chronic kidney disease, or unspecified chronic kidney disease: Secondary | ICD-10-CM | POA: Diagnosis not present

## 2022-12-09 DIAGNOSIS — N183 Chronic kidney disease, stage 3 unspecified: Secondary | ICD-10-CM | POA: Diagnosis not present

## 2022-12-09 DIAGNOSIS — G8929 Other chronic pain: Secondary | ICD-10-CM | POA: Diagnosis not present

## 2022-12-09 DIAGNOSIS — K219 Gastro-esophageal reflux disease without esophagitis: Secondary | ICD-10-CM | POA: Diagnosis not present

## 2022-12-09 DIAGNOSIS — Z9181 History of falling: Secondary | ICD-10-CM | POA: Diagnosis not present

## 2022-12-09 DIAGNOSIS — I890 Lymphedema, not elsewhere classified: Secondary | ICD-10-CM | POA: Diagnosis not present

## 2022-12-09 DIAGNOSIS — I447 Left bundle-branch block, unspecified: Secondary | ICD-10-CM | POA: Diagnosis not present

## 2022-12-09 DIAGNOSIS — E039 Hypothyroidism, unspecified: Secondary | ICD-10-CM | POA: Diagnosis not present

## 2022-12-09 DIAGNOSIS — H9193 Unspecified hearing loss, bilateral: Secondary | ICD-10-CM | POA: Diagnosis not present

## 2022-12-09 DIAGNOSIS — D696 Thrombocytopenia, unspecified: Secondary | ICD-10-CM | POA: Diagnosis not present

## 2022-12-09 DIAGNOSIS — G3184 Mild cognitive impairment, so stated: Secondary | ICD-10-CM | POA: Diagnosis not present

## 2022-12-09 DIAGNOSIS — Q246 Congenital heart block: Secondary | ICD-10-CM | POA: Diagnosis not present

## 2022-12-09 DIAGNOSIS — R262 Difficulty in walking, not elsewhere classified: Secondary | ICD-10-CM | POA: Diagnosis not present

## 2022-12-09 DIAGNOSIS — D631 Anemia in chronic kidney disease: Secondary | ICD-10-CM | POA: Diagnosis not present

## 2022-12-09 DIAGNOSIS — M6281 Muscle weakness (generalized): Secondary | ICD-10-CM | POA: Diagnosis not present

## 2022-12-09 DIAGNOSIS — Z7982 Long term (current) use of aspirin: Secondary | ICD-10-CM | POA: Diagnosis not present

## 2022-12-09 DIAGNOSIS — I739 Peripheral vascular disease, unspecified: Secondary | ICD-10-CM | POA: Diagnosis not present

## 2022-12-09 DIAGNOSIS — I5032 Chronic diastolic (congestive) heart failure: Secondary | ICD-10-CM | POA: Diagnosis not present

## 2022-12-09 NOTE — Telephone Encounter (Signed)
[facility] didn't accept insurance.Says podiatry in [place] would not do leg wraps. Advised that home health nurse could do leg wraps. Says she will discuss with home health nurse at next visit and contact office back if more assistance was needed.

## 2022-12-09 NOTE — Telephone Encounter (Signed)
Spoke with [facility] today regarding the referral that was sent to the office. Hunter [name] told the [facility] that Mr. Hunter [name] needs to have his legs wrapped. Patient was advised that they would contact our office regarding this referral.

## 2022-12-10 DIAGNOSIS — I739 Peripheral vascular disease, unspecified: Secondary | ICD-10-CM | POA: Diagnosis not present

## 2022-12-10 DIAGNOSIS — H9193 Unspecified hearing loss, bilateral: Secondary | ICD-10-CM | POA: Diagnosis not present

## 2022-12-10 DIAGNOSIS — G629 Polyneuropathy, unspecified: Secondary | ICD-10-CM | POA: Diagnosis not present

## 2022-12-10 DIAGNOSIS — Z9181 History of falling: Secondary | ICD-10-CM | POA: Diagnosis not present

## 2022-12-10 DIAGNOSIS — I130 Hypertensive heart and chronic kidney disease with heart failure and stage 1 through stage 4 chronic kidney disease, or unspecified chronic kidney disease: Secondary | ICD-10-CM | POA: Diagnosis not present

## 2022-12-10 DIAGNOSIS — K219 Gastro-esophageal reflux disease without esophagitis: Secondary | ICD-10-CM | POA: Diagnosis not present

## 2022-12-10 DIAGNOSIS — N183 Chronic kidney disease, stage 3 unspecified: Secondary | ICD-10-CM | POA: Diagnosis not present

## 2022-12-10 DIAGNOSIS — R262 Difficulty in walking, not elsewhere classified: Secondary | ICD-10-CM | POA: Diagnosis not present

## 2022-12-10 DIAGNOSIS — Q246 Congenital heart block: Secondary | ICD-10-CM | POA: Diagnosis not present

## 2022-12-10 DIAGNOSIS — M6281 Muscle weakness (generalized): Secondary | ICD-10-CM | POA: Diagnosis not present

## 2022-12-10 DIAGNOSIS — E559 Vitamin D deficiency, unspecified: Secondary | ICD-10-CM | POA: Diagnosis not present

## 2022-12-10 DIAGNOSIS — I890 Lymphedema, not elsewhere classified: Secondary | ICD-10-CM | POA: Diagnosis not present

## 2022-12-10 DIAGNOSIS — G8929 Other chronic pain: Secondary | ICD-10-CM | POA: Diagnosis not present

## 2022-12-10 DIAGNOSIS — D696 Thrombocytopenia, unspecified: Secondary | ICD-10-CM | POA: Diagnosis not present

## 2022-12-10 DIAGNOSIS — D631 Anemia in chronic kidney disease: Secondary | ICD-10-CM | POA: Diagnosis not present

## 2022-12-10 DIAGNOSIS — E039 Hypothyroidism, unspecified: Secondary | ICD-10-CM | POA: Diagnosis not present

## 2022-12-10 DIAGNOSIS — I5032 Chronic diastolic (congestive) heart failure: Secondary | ICD-10-CM | POA: Diagnosis not present

## 2022-12-10 DIAGNOSIS — Z7982 Long term (current) use of aspirin: Secondary | ICD-10-CM | POA: Diagnosis not present

## 2022-12-10 DIAGNOSIS — G3184 Mild cognitive impairment, so stated: Secondary | ICD-10-CM | POA: Diagnosis not present

## 2022-12-10 DIAGNOSIS — M4802 Spinal stenosis, cervical region: Secondary | ICD-10-CM | POA: Diagnosis not present

## 2022-12-10 DIAGNOSIS — I447 Left bundle-branch block, unspecified: Secondary | ICD-10-CM | POA: Diagnosis not present

## 2022-12-11 DIAGNOSIS — Q246 Congenital heart block: Secondary | ICD-10-CM | POA: Diagnosis not present

## 2022-12-11 DIAGNOSIS — I739 Peripheral vascular disease, unspecified: Secondary | ICD-10-CM | POA: Diagnosis not present

## 2022-12-11 DIAGNOSIS — E559 Vitamin D deficiency, unspecified: Secondary | ICD-10-CM | POA: Diagnosis not present

## 2022-12-11 DIAGNOSIS — G8929 Other chronic pain: Secondary | ICD-10-CM | POA: Diagnosis not present

## 2022-12-11 DIAGNOSIS — I890 Lymphedema, not elsewhere classified: Secondary | ICD-10-CM | POA: Diagnosis not present

## 2022-12-11 DIAGNOSIS — I447 Left bundle-branch block, unspecified: Secondary | ICD-10-CM | POA: Diagnosis not present

## 2022-12-11 DIAGNOSIS — N183 Chronic kidney disease, stage 3 unspecified: Secondary | ICD-10-CM | POA: Diagnosis not present

## 2022-12-11 DIAGNOSIS — Z7982 Long term (current) use of aspirin: Secondary | ICD-10-CM | POA: Diagnosis not present

## 2022-12-11 DIAGNOSIS — I5032 Chronic diastolic (congestive) heart failure: Secondary | ICD-10-CM | POA: Diagnosis not present

## 2022-12-11 DIAGNOSIS — K219 Gastro-esophageal reflux disease without esophagitis: Secondary | ICD-10-CM | POA: Diagnosis not present

## 2022-12-11 DIAGNOSIS — G629 Polyneuropathy, unspecified: Secondary | ICD-10-CM | POA: Diagnosis not present

## 2022-12-11 DIAGNOSIS — Z9181 History of falling: Secondary | ICD-10-CM | POA: Diagnosis not present

## 2022-12-11 DIAGNOSIS — I130 Hypertensive heart and chronic kidney disease with heart failure and stage 1 through stage 4 chronic kidney disease, or unspecified chronic kidney disease: Secondary | ICD-10-CM | POA: Diagnosis not present

## 2022-12-11 DIAGNOSIS — M6281 Muscle weakness (generalized): Secondary | ICD-10-CM | POA: Diagnosis not present

## 2022-12-11 DIAGNOSIS — G3184 Mild cognitive impairment, so stated: Secondary | ICD-10-CM | POA: Diagnosis not present

## 2022-12-11 DIAGNOSIS — R262 Difficulty in walking, not elsewhere classified: Secondary | ICD-10-CM | POA: Diagnosis not present

## 2022-12-11 DIAGNOSIS — E039 Hypothyroidism, unspecified: Secondary | ICD-10-CM | POA: Diagnosis not present

## 2022-12-11 DIAGNOSIS — D696 Thrombocytopenia, unspecified: Secondary | ICD-10-CM | POA: Diagnosis not present

## 2022-12-11 DIAGNOSIS — D631 Anemia in chronic kidney disease: Secondary | ICD-10-CM | POA: Diagnosis not present

## 2022-12-11 DIAGNOSIS — H9193 Unspecified hearing loss, bilateral: Secondary | ICD-10-CM | POA: Diagnosis not present

## 2022-12-11 DIAGNOSIS — M4802 Spinal stenosis, cervical region: Secondary | ICD-10-CM | POA: Diagnosis not present

## 2022-12-14 DIAGNOSIS — G629 Polyneuropathy, unspecified: Secondary | ICD-10-CM | POA: Diagnosis not present

## 2022-12-14 DIAGNOSIS — D696 Thrombocytopenia, unspecified: Secondary | ICD-10-CM | POA: Diagnosis not present

## 2022-12-14 DIAGNOSIS — I890 Lymphedema, not elsewhere classified: Secondary | ICD-10-CM | POA: Diagnosis not present

## 2022-12-14 DIAGNOSIS — I5032 Chronic diastolic (congestive) heart failure: Secondary | ICD-10-CM | POA: Diagnosis not present

## 2022-12-14 DIAGNOSIS — G3184 Mild cognitive impairment, so stated: Secondary | ICD-10-CM | POA: Diagnosis not present

## 2022-12-14 DIAGNOSIS — K219 Gastro-esophageal reflux disease without esophagitis: Secondary | ICD-10-CM | POA: Diagnosis not present

## 2022-12-14 DIAGNOSIS — Z7982 Long term (current) use of aspirin: Secondary | ICD-10-CM | POA: Diagnosis not present

## 2022-12-14 DIAGNOSIS — I130 Hypertensive heart and chronic kidney disease with heart failure and stage 1 through stage 4 chronic kidney disease, or unspecified chronic kidney disease: Secondary | ICD-10-CM | POA: Diagnosis not present

## 2022-12-14 DIAGNOSIS — I739 Peripheral vascular disease, unspecified: Secondary | ICD-10-CM | POA: Diagnosis not present

## 2022-12-14 DIAGNOSIS — I447 Left bundle-branch block, unspecified: Secondary | ICD-10-CM | POA: Diagnosis not present

## 2022-12-14 DIAGNOSIS — Q246 Congenital heart block: Secondary | ICD-10-CM | POA: Diagnosis not present

## 2022-12-14 DIAGNOSIS — D631 Anemia in chronic kidney disease: Secondary | ICD-10-CM | POA: Diagnosis not present

## 2022-12-14 DIAGNOSIS — N183 Chronic kidney disease, stage 3 unspecified: Secondary | ICD-10-CM | POA: Diagnosis not present

## 2022-12-14 DIAGNOSIS — M6281 Muscle weakness (generalized): Secondary | ICD-10-CM | POA: Diagnosis not present

## 2022-12-14 DIAGNOSIS — E039 Hypothyroidism, unspecified: Secondary | ICD-10-CM | POA: Diagnosis not present

## 2022-12-14 DIAGNOSIS — R262 Difficulty in walking, not elsewhere classified: Secondary | ICD-10-CM | POA: Diagnosis not present

## 2022-12-14 DIAGNOSIS — E559 Vitamin D deficiency, unspecified: Secondary | ICD-10-CM | POA: Diagnosis not present

## 2022-12-14 DIAGNOSIS — M4802 Spinal stenosis, cervical region: Secondary | ICD-10-CM | POA: Diagnosis not present

## 2022-12-14 DIAGNOSIS — H9193 Unspecified hearing loss, bilateral: Secondary | ICD-10-CM | POA: Diagnosis not present

## 2022-12-14 DIAGNOSIS — Z9181 History of falling: Secondary | ICD-10-CM | POA: Diagnosis not present

## 2022-12-14 DIAGNOSIS — G8929 Other chronic pain: Secondary | ICD-10-CM | POA: Diagnosis not present

## 2022-12-15 ENCOUNTER — Ambulatory Visit: Payer: Self-pay

## 2022-12-15 NOTE — Patient Instructions (Signed)
Visit Information  Thank you for taking time to visit with me today. Please don't hesitate to contact me if I can be of assistance to you.   Following are the goals we discussed today:   Goals Addressed   None     Our next appointment is by telephone on 01/25/23 at 1030  Please call the care guide team at [phone] if you need to cancel or reschedule your appointment.   If you are experiencing a Mental Health or Behavioral Health Crisis or need someone to talk to, please call the Suicide and Crisis Lifeline: 988   The patient verbalized understanding of instructions, educational materials, and care plan provided today and DECLINED offer to receive copy of patient instructions, educational materials, and care plan.   The patient has been provided with contact information for the care management team and has been advised to call with any health related questions or concerns.   Bary Leriche, RN, MSN [facility] Care Management Care Management Coordinator Direct Line [phone]

## 2022-12-15 NOTE — Patient Outreach (Signed)
  Care Coordination   Follow Up Visit Note   12/15/2022 Name: Hunter [name] MRN: [id number] DOB: [birth year]  Hunter [name] is a 89 y.o. year old male who sees [name], [name], MD for primary care. I  spoke with Andrey Spearman. Patient doing pretty good. Now has home health coming in working on exercise and med adherence.    What matters to the patients health and wellness today?  Managing health    Goals Addressed   None     SDOH assessments and interventions completed:  Yes     Care Coordination Interventions:  Yes, provided   Follow up plan: Follow up call scheduled for July    Encounter Outcome:  Pt. Visit Completed   Bary Leriche, RN, MSN [facility] Care Management Care Management Coordinator Direct Line [phone]

## 2022-12-16 DIAGNOSIS — M4802 Spinal stenosis, cervical region: Secondary | ICD-10-CM | POA: Diagnosis not present

## 2022-12-16 DIAGNOSIS — D696 Thrombocytopenia, unspecified: Secondary | ICD-10-CM | POA: Diagnosis not present

## 2022-12-16 DIAGNOSIS — G8929 Other chronic pain: Secondary | ICD-10-CM | POA: Diagnosis not present

## 2022-12-16 DIAGNOSIS — E039 Hypothyroidism, unspecified: Secondary | ICD-10-CM | POA: Diagnosis not present

## 2022-12-16 DIAGNOSIS — G629 Polyneuropathy, unspecified: Secondary | ICD-10-CM | POA: Diagnosis not present

## 2022-12-16 DIAGNOSIS — I890 Lymphedema, not elsewhere classified: Secondary | ICD-10-CM | POA: Diagnosis not present

## 2022-12-16 DIAGNOSIS — I447 Left bundle-branch block, unspecified: Secondary | ICD-10-CM | POA: Diagnosis not present

## 2022-12-16 DIAGNOSIS — N183 Chronic kidney disease, stage 3 unspecified: Secondary | ICD-10-CM | POA: Diagnosis not present

## 2022-12-16 DIAGNOSIS — H9193 Unspecified hearing loss, bilateral: Secondary | ICD-10-CM | POA: Diagnosis not present

## 2022-12-16 DIAGNOSIS — I130 Hypertensive heart and chronic kidney disease with heart failure and stage 1 through stage 4 chronic kidney disease, or unspecified chronic kidney disease: Secondary | ICD-10-CM | POA: Diagnosis not present

## 2022-12-16 DIAGNOSIS — Q246 Congenital heart block: Secondary | ICD-10-CM | POA: Diagnosis not present

## 2022-12-16 DIAGNOSIS — E559 Vitamin D deficiency, unspecified: Secondary | ICD-10-CM | POA: Diagnosis not present

## 2022-12-16 DIAGNOSIS — R262 Difficulty in walking, not elsewhere classified: Secondary | ICD-10-CM | POA: Diagnosis not present

## 2022-12-16 DIAGNOSIS — I5032 Chronic diastolic (congestive) heart failure: Secondary | ICD-10-CM | POA: Diagnosis not present

## 2022-12-16 DIAGNOSIS — G3184 Mild cognitive impairment, so stated: Secondary | ICD-10-CM | POA: Diagnosis not present

## 2022-12-16 DIAGNOSIS — I739 Peripheral vascular disease, unspecified: Secondary | ICD-10-CM | POA: Diagnosis not present

## 2022-12-16 DIAGNOSIS — K219 Gastro-esophageal reflux disease without esophagitis: Secondary | ICD-10-CM | POA: Diagnosis not present

## 2022-12-16 DIAGNOSIS — M6281 Muscle weakness (generalized): Secondary | ICD-10-CM | POA: Diagnosis not present

## 2022-12-16 DIAGNOSIS — D631 Anemia in chronic kidney disease: Secondary | ICD-10-CM | POA: Diagnosis not present

## 2022-12-16 DIAGNOSIS — Z9181 History of falling: Secondary | ICD-10-CM | POA: Diagnosis not present

## 2022-12-16 DIAGNOSIS — Z7982 Long term (current) use of aspirin: Secondary | ICD-10-CM | POA: Diagnosis not present

## 2022-12-21 ENCOUNTER — Telehealth: Payer: Self-pay

## 2022-12-21 DIAGNOSIS — E039 Hypothyroidism, unspecified: Secondary | ICD-10-CM | POA: Diagnosis not present

## 2022-12-21 DIAGNOSIS — I739 Peripheral vascular disease, unspecified: Secondary | ICD-10-CM | POA: Diagnosis not present

## 2022-12-21 DIAGNOSIS — Q246 Congenital heart block: Secondary | ICD-10-CM | POA: Diagnosis not present

## 2022-12-21 DIAGNOSIS — I447 Left bundle-branch block, unspecified: Secondary | ICD-10-CM | POA: Diagnosis not present

## 2022-12-21 DIAGNOSIS — Z9181 History of falling: Secondary | ICD-10-CM | POA: Diagnosis not present

## 2022-12-21 DIAGNOSIS — I5032 Chronic diastolic (congestive) heart failure: Secondary | ICD-10-CM | POA: Diagnosis not present

## 2022-12-21 DIAGNOSIS — Z7982 Long term (current) use of aspirin: Secondary | ICD-10-CM | POA: Diagnosis not present

## 2022-12-21 DIAGNOSIS — G629 Polyneuropathy, unspecified: Secondary | ICD-10-CM | POA: Diagnosis not present

## 2022-12-21 DIAGNOSIS — I890 Lymphedema, not elsewhere classified: Secondary | ICD-10-CM | POA: Diagnosis not present

## 2022-12-21 DIAGNOSIS — M6281 Muscle weakness (generalized): Secondary | ICD-10-CM | POA: Diagnosis not present

## 2022-12-21 DIAGNOSIS — D696 Thrombocytopenia, unspecified: Secondary | ICD-10-CM | POA: Diagnosis not present

## 2022-12-21 DIAGNOSIS — H9193 Unspecified hearing loss, bilateral: Secondary | ICD-10-CM | POA: Diagnosis not present

## 2022-12-21 DIAGNOSIS — N183 Chronic kidney disease, stage 3 unspecified: Secondary | ICD-10-CM | POA: Diagnosis not present

## 2022-12-21 DIAGNOSIS — M4802 Spinal stenosis, cervical region: Secondary | ICD-10-CM | POA: Diagnosis not present

## 2022-12-21 DIAGNOSIS — E559 Vitamin D deficiency, unspecified: Secondary | ICD-10-CM | POA: Diagnosis not present

## 2022-12-21 DIAGNOSIS — G8929 Other chronic pain: Secondary | ICD-10-CM | POA: Diagnosis not present

## 2022-12-21 DIAGNOSIS — D631 Anemia in chronic kidney disease: Secondary | ICD-10-CM | POA: Diagnosis not present

## 2022-12-21 DIAGNOSIS — K219 Gastro-esophageal reflux disease without esophagitis: Secondary | ICD-10-CM | POA: Diagnosis not present

## 2022-12-21 DIAGNOSIS — I130 Hypertensive heart and chronic kidney disease with heart failure and stage 1 through stage 4 chronic kidney disease, or unspecified chronic kidney disease: Secondary | ICD-10-CM | POA: Diagnosis not present

## 2022-12-21 DIAGNOSIS — R262 Difficulty in walking, not elsewhere classified: Secondary | ICD-10-CM | POA: Diagnosis not present

## 2022-12-21 DIAGNOSIS — G3184 Mild cognitive impairment, so stated: Secondary | ICD-10-CM | POA: Diagnosis not present

## 2022-12-21 NOTE — Telephone Encounter (Deleted)
   Telephone encounter was:  Successful.  12/21/2022 Name: EUCLID CASSETTA MRN: [id number] DOB: 14-Sep-1933  DELOSS AMICO is a 89 y.o. year old male who is a primary care patient of Shelva Majestic, MD . The community resource team was consulted for assistance with Transportation Needs   Care guide performed the following interventions: Spoke with patient's friend [name] to confirm she has received call confirming pickup at 8:25am on 12/25/22. [name] confirmed she received call.   Follow Up Plan:  No further follow up planned at this time. The patient has been provided with needed resources.  Great news! [employer] has accepted ride [id number] for [employer] on . There is no further action required at this time. If you have any questions, please call the [facility] support team at [phone] OR email [email or web address]. Have a great day! [facility] Support Team Ride Details: Roundtrip A-Leg Ride ID: [id number] Passenger Name: Cynda Acres Passenger Phone: [phone] Ride Date:  Daryll Drown Time: 08:25 AM (EDT) Pickup Address: [address] [place], [place] [address] Drop-off Address: [address] [place], [place], [place] Transportation Type: Standard Vehicle: Door-to-Door Investment banker, corporate: {'Patient uses a walker and will be accompanied by friend Andrey Spearman [phone] up: [phone], [place], [place] [address]\n\nDrop off: \t\nJohn Ulyess Mort, MD, [place] [facility] [place], [address], [place] [address], phone [phone]'} Peter Minium Time: 11:59 PM (EDT) Pickup Address: [address] [place], [place], [place] Drop-off Address: [address] [place], [place] [address] Transportation Type: Standard Vehicle: Door-to-Door   Leisure centre manager Health  [facility] Population [facility] [employer] Care Guide   [email or web address]  ?? [phone]   Website: [email or web address]  [email or web address]

## 2022-12-21 NOTE — Telephone Encounter (Deleted)
   Telephone encounter was:  Successful.  12/21/2022 Name: EUCLID CASSETTA MRN: [id number] DOB: 14-Sep-1933  DELOSS AMICO is a 89 y.o. year old male who is a primary care patient of Shelva Majestic, MD . The community resource team was consulted for assistance with Transportation Needs   Care guide performed the following interventions: Spoke with patient's friend [name] to confirm she has received call confirming pickup at 8:25am on 12/25/22. [name] confirmed she received call.   Follow Up Plan:  No further follow up planned at this time. The patient has been provided with needed resources.  Great news! [employer] has accepted ride [id number] for [employer] on . There is no further action required at this time. If you have any questions, please call the [employer] support team at [phone] OR email [email or web address]. Have a great day! [facility] Support Team Ride Details: Roundtrip A-Leg Ride ID: [id number] Passenger Name: Cynda Acres Passenger Phone: [phone] Ride Date:  Daryll Drown Time: 08:25 AM (EDT) Pickup Address: [address] [place], [place] [address] Drop-off Address: [address] [place], [place], [place] Transportation Type: Standard Vehicle: Door-to-Door Investment banker, corporate: {'Patient uses a walker and will be accompanied by friend Andrey Spearman [phone] up: [phone], [place], [place] [address]\n\nDrop off: \t\nJohn Ulyess Mort, MD, [place] [facility] [place], [address], [place] [address], phone [phone]'} Peter Minium Time: 11:59 PM (EDT) Pickup Address: [address] [place], [place], [place] Drop-off Address: [address] [place], [place] [address] Transportation Type: Standard Vehicle: Door-to-Door   Leisure centre manager Health  [facility] Population Health [employer] Care Guide   [email or web address]  ?? [phone]   Website: [email or web address]  [email or web address]

## 2022-12-21 NOTE — Telephone Encounter (Signed)
   Telephone encounter was:  Successful.  12/21/2022 Name: CLEMONS SALVUCCI MRN: [id number] DOB: 1934-07-11  AMUN STEMM is a 89 y.o. year old male who is a primary care patient of Shelva Majestic, MD . The community resource team was consulted for assistance with Transportation Needs   Care guide performed the following interventions: Spoke with patient's friend [name] to arrange transportation for 12/25/22. Also gave [name] the [facility] number for future requests [phone].  Follow Up Plan:  I will call [name] when I receive confirmation from [employer].  12/21/2022  [name] DOB: 1933-10-25 MRN: [id number]   RIDER WAIVER AND RELEASE OF LIABILITY  For the purposes of helping with transportation needs, [facility] partners with outside transportation providers (taxi companies, [employer], Catering manager.) to give [employer] patients or other approved people the choice of on-demand rides [employer]") to our buildings for non-emergency visits.  By using [employer], I, the person signing this document, on behalf of myself and/or any legal minors (in my care using the [employer]), agree:  Science writer given to me are supplied by independent, outside transportation providers who do not work for, or have any affiliation with, [employer]. [place] is not a transportation company. [facility] has no control over the quality or safety of the rides I get using [employer]. [place] has no control over whether any outside ride will happen on time or not. [place] gives no guarantee on the reliability, quality, safety, or availability on any rides, or that no mistakes will happen. I know and accept that traveling by vehicle (car, truck, SVU, Zenaida Niece, bus, taxi, etc.) has risks of serious injuries such as disability, being paralyzed, and death. I know and agree the risk of using [employer] is mine alone, and not [employer]. Transport Services  are provided "as is" and as are available. The transportation providers are in charge for all inspections and care of the vehicles used to provide these rides. I agree not to take legal action against [employer], its agents, employees, officers, directors, representatives, insurers, attorneys, assigns, successors, subsidiaries, and affiliates at any time for any reasons related directly or indirectly to using [employer]. I also agree not to take legal action against [employer] or its affiliates for any injury, death, or damage to property caused by or related to using [employer]. I have read this Waiver and Release of Liability, and I understand the terms used in it and their legal meaning. This Waiver is freely and voluntarily given with the understanding that my right (or any legal minors) to legal action against [place] relating to [employer] is knowingly given up to use these services.   I attest that I read the Ride Waiver and Release of Liability to [name], gave Mr. Agena the opportunity to ask questions and answered the questions asked (if any). I affirm that [name] then provided consent for assistance with transportation.     [place] Decarolis D Romy Mcgue Jahsir Rama [place]  [facility] Community Resource Care Guide   [email or web address]  ?? [phone]   Website: [email or web address]  [email or web address]

## 2022-12-23 ENCOUNTER — Telehealth: Payer: Self-pay | Admitting: Nurse Practitioner

## 2022-12-23 NOTE — Telephone Encounter (Signed)
Patient's friend is calling needing the number of the advanced home health facility she states Hunter [name] has coming out to his home.  Please advise.

## 2022-12-23 NOTE — Telephone Encounter (Signed)
Contacted and gave name [facility] phone # [phone]

## 2022-12-24 ENCOUNTER — Telehealth: Payer: Self-pay | Admitting: Family Medicine

## 2022-12-24 DIAGNOSIS — H9193 Unspecified hearing loss, bilateral: Secondary | ICD-10-CM | POA: Diagnosis not present

## 2022-12-24 DIAGNOSIS — Z9181 History of falling: Secondary | ICD-10-CM | POA: Diagnosis not present

## 2022-12-24 DIAGNOSIS — I447 Left bundle-branch block, unspecified: Secondary | ICD-10-CM | POA: Diagnosis not present

## 2022-12-24 DIAGNOSIS — Q246 Congenital heart block: Secondary | ICD-10-CM | POA: Diagnosis not present

## 2022-12-24 DIAGNOSIS — M6281 Muscle weakness (generalized): Secondary | ICD-10-CM | POA: Diagnosis not present

## 2022-12-24 DIAGNOSIS — D631 Anemia in chronic kidney disease: Secondary | ICD-10-CM | POA: Diagnosis not present

## 2022-12-24 DIAGNOSIS — E559 Vitamin D deficiency, unspecified: Secondary | ICD-10-CM | POA: Diagnosis not present

## 2022-12-24 DIAGNOSIS — K219 Gastro-esophageal reflux disease without esophagitis: Secondary | ICD-10-CM | POA: Diagnosis not present

## 2022-12-24 DIAGNOSIS — Z7982 Long term (current) use of aspirin: Secondary | ICD-10-CM | POA: Diagnosis not present

## 2022-12-24 DIAGNOSIS — G629 Polyneuropathy, unspecified: Secondary | ICD-10-CM | POA: Diagnosis not present

## 2022-12-24 DIAGNOSIS — E039 Hypothyroidism, unspecified: Secondary | ICD-10-CM | POA: Diagnosis not present

## 2022-12-24 DIAGNOSIS — N183 Chronic kidney disease, stage 3 unspecified: Secondary | ICD-10-CM | POA: Diagnosis not present

## 2022-12-24 DIAGNOSIS — I890 Lymphedema, not elsewhere classified: Secondary | ICD-10-CM | POA: Diagnosis not present

## 2022-12-24 DIAGNOSIS — G3184 Mild cognitive impairment, so stated: Secondary | ICD-10-CM | POA: Diagnosis not present

## 2022-12-24 DIAGNOSIS — R262 Difficulty in walking, not elsewhere classified: Secondary | ICD-10-CM | POA: Diagnosis not present

## 2022-12-24 DIAGNOSIS — I130 Hypertensive heart and chronic kidney disease with heart failure and stage 1 through stage 4 chronic kidney disease, or unspecified chronic kidney disease: Secondary | ICD-10-CM | POA: Diagnosis not present

## 2022-12-24 DIAGNOSIS — G8929 Other chronic pain: Secondary | ICD-10-CM | POA: Diagnosis not present

## 2022-12-24 DIAGNOSIS — M4802 Spinal stenosis, cervical region: Secondary | ICD-10-CM | POA: Diagnosis not present

## 2022-12-24 DIAGNOSIS — D696 Thrombocytopenia, unspecified: Secondary | ICD-10-CM | POA: Diagnosis not present

## 2022-12-24 DIAGNOSIS — I739 Peripheral vascular disease, unspecified: Secondary | ICD-10-CM | POA: Diagnosis not present

## 2022-12-24 DIAGNOSIS — I5032 Chronic diastolic (congestive) heart failure: Secondary | ICD-10-CM | POA: Diagnosis not present

## 2022-12-24 NOTE — Telephone Encounter (Signed)
Large departure from my last interaction with him-if he is acting that poorly-need to consider triage/sending to the hospital - He is overdue for follow-up with me and that can be scheduled once things have stabilized or if triage thinks he is stable and we can work him in at next available

## 2022-12-24 NOTE — Telephone Encounter (Signed)
Home Health Verbal Orders  Agency:  [facility]  Caller:  Shawna  Reason for call:  Pt is irritable, combative, yelling, saw 2 deer and 7 men in camo to shoot deer, was on the phone with Trump for 2 hours, bad memory, sees things, only take a bath once a month, apartment smells like urine.   [phone] Can leave message

## 2022-12-24 NOTE — Telephone Encounter (Signed)
FYI

## 2022-12-25 ENCOUNTER — Encounter (INDEPENDENT_AMBULATORY_CARE_PROVIDER_SITE_OTHER): Payer: Medicare Other | Admitting: Ophthalmology

## 2022-12-25 DIAGNOSIS — H353231 Exudative age-related macular degeneration, bilateral, with active choroidal neovascularization: Secondary | ICD-10-CM | POA: Diagnosis not present

## 2022-12-25 DIAGNOSIS — H35033 Hypertensive retinopathy, bilateral: Secondary | ICD-10-CM

## 2022-12-25 DIAGNOSIS — I10 Essential (primary) hypertension: Secondary | ICD-10-CM

## 2022-12-25 DIAGNOSIS — H43813 Vitreous degeneration, bilateral: Secondary | ICD-10-CM

## 2022-12-25 NOTE — Telephone Encounter (Signed)
Called to get patient triaged but pt's friend, [name] answered. States patient was sleeping due to being tired from eye appointment earlier today. I informed her that PCP wanted him to speak with our triage nurse. Recommended that she call when he wakes up. [name] verbalized understanding.

## 2022-12-25 NOTE — Telephone Encounter (Signed)
Please get pt connected with triage based on symptoms in below message.

## 2022-12-28 DIAGNOSIS — I130 Hypertensive heart and chronic kidney disease with heart failure and stage 1 through stage 4 chronic kidney disease, or unspecified chronic kidney disease: Secondary | ICD-10-CM | POA: Diagnosis not present

## 2022-12-28 DIAGNOSIS — Z9181 History of falling: Secondary | ICD-10-CM | POA: Diagnosis not present

## 2022-12-28 DIAGNOSIS — E559 Vitamin D deficiency, unspecified: Secondary | ICD-10-CM | POA: Diagnosis not present

## 2022-12-28 DIAGNOSIS — K219 Gastro-esophageal reflux disease without esophagitis: Secondary | ICD-10-CM | POA: Diagnosis not present

## 2022-12-28 DIAGNOSIS — I5032 Chronic diastolic (congestive) heart failure: Secondary | ICD-10-CM | POA: Diagnosis not present

## 2022-12-28 DIAGNOSIS — G3184 Mild cognitive impairment, so stated: Secondary | ICD-10-CM | POA: Diagnosis not present

## 2022-12-28 DIAGNOSIS — E039 Hypothyroidism, unspecified: Secondary | ICD-10-CM | POA: Diagnosis not present

## 2022-12-28 DIAGNOSIS — G629 Polyneuropathy, unspecified: Secondary | ICD-10-CM | POA: Diagnosis not present

## 2022-12-28 DIAGNOSIS — I447 Left bundle-branch block, unspecified: Secondary | ICD-10-CM | POA: Diagnosis not present

## 2022-12-28 DIAGNOSIS — D631 Anemia in chronic kidney disease: Secondary | ICD-10-CM | POA: Diagnosis not present

## 2022-12-28 DIAGNOSIS — N183 Chronic kidney disease, stage 3 unspecified: Secondary | ICD-10-CM | POA: Diagnosis not present

## 2022-12-28 DIAGNOSIS — H9193 Unspecified hearing loss, bilateral: Secondary | ICD-10-CM | POA: Diagnosis not present

## 2022-12-28 DIAGNOSIS — D696 Thrombocytopenia, unspecified: Secondary | ICD-10-CM | POA: Diagnosis not present

## 2022-12-28 DIAGNOSIS — I739 Peripheral vascular disease, unspecified: Secondary | ICD-10-CM | POA: Diagnosis not present

## 2022-12-28 DIAGNOSIS — M6281 Muscle weakness (generalized): Secondary | ICD-10-CM | POA: Diagnosis not present

## 2022-12-28 DIAGNOSIS — G8929 Other chronic pain: Secondary | ICD-10-CM | POA: Diagnosis not present

## 2022-12-28 DIAGNOSIS — Q246 Congenital heart block: Secondary | ICD-10-CM | POA: Diagnosis not present

## 2022-12-28 DIAGNOSIS — I890 Lymphedema, not elsewhere classified: Secondary | ICD-10-CM | POA: Diagnosis not present

## 2022-12-28 DIAGNOSIS — M4802 Spinal stenosis, cervical region: Secondary | ICD-10-CM | POA: Diagnosis not present

## 2022-12-28 DIAGNOSIS — R262 Difficulty in walking, not elsewhere classified: Secondary | ICD-10-CM | POA: Diagnosis not present

## 2022-12-28 DIAGNOSIS — Z7982 Long term (current) use of aspirin: Secondary | ICD-10-CM | POA: Diagnosis not present

## 2022-12-30 DIAGNOSIS — Z7982 Long term (current) use of aspirin: Secondary | ICD-10-CM | POA: Diagnosis not present

## 2022-12-30 DIAGNOSIS — G3184 Mild cognitive impairment, so stated: Secondary | ICD-10-CM | POA: Diagnosis not present

## 2022-12-30 DIAGNOSIS — R262 Difficulty in walking, not elsewhere classified: Secondary | ICD-10-CM | POA: Diagnosis not present

## 2022-12-30 DIAGNOSIS — D631 Anemia in chronic kidney disease: Secondary | ICD-10-CM | POA: Diagnosis not present

## 2022-12-30 DIAGNOSIS — N183 Chronic kidney disease, stage 3 unspecified: Secondary | ICD-10-CM | POA: Diagnosis not present

## 2022-12-30 DIAGNOSIS — G629 Polyneuropathy, unspecified: Secondary | ICD-10-CM | POA: Diagnosis not present

## 2022-12-30 DIAGNOSIS — I890 Lymphedema, not elsewhere classified: Secondary | ICD-10-CM | POA: Diagnosis not present

## 2022-12-30 DIAGNOSIS — E039 Hypothyroidism, unspecified: Secondary | ICD-10-CM | POA: Diagnosis not present

## 2022-12-30 DIAGNOSIS — M6281 Muscle weakness (generalized): Secondary | ICD-10-CM | POA: Diagnosis not present

## 2022-12-30 DIAGNOSIS — H9193 Unspecified hearing loss, bilateral: Secondary | ICD-10-CM | POA: Diagnosis not present

## 2022-12-30 DIAGNOSIS — D696 Thrombocytopenia, unspecified: Secondary | ICD-10-CM | POA: Diagnosis not present

## 2022-12-30 DIAGNOSIS — M4802 Spinal stenosis, cervical region: Secondary | ICD-10-CM | POA: Diagnosis not present

## 2022-12-30 DIAGNOSIS — E559 Vitamin D deficiency, unspecified: Secondary | ICD-10-CM | POA: Diagnosis not present

## 2022-12-30 DIAGNOSIS — Z9181 History of falling: Secondary | ICD-10-CM | POA: Diagnosis not present

## 2022-12-30 DIAGNOSIS — I739 Peripheral vascular disease, unspecified: Secondary | ICD-10-CM | POA: Diagnosis not present

## 2022-12-30 DIAGNOSIS — I130 Hypertensive heart and chronic kidney disease with heart failure and stage 1 through stage 4 chronic kidney disease, or unspecified chronic kidney disease: Secondary | ICD-10-CM | POA: Diagnosis not present

## 2022-12-30 DIAGNOSIS — G8929 Other chronic pain: Secondary | ICD-10-CM | POA: Diagnosis not present

## 2022-12-30 DIAGNOSIS — K219 Gastro-esophageal reflux disease without esophagitis: Secondary | ICD-10-CM | POA: Diagnosis not present

## 2022-12-30 DIAGNOSIS — Q246 Congenital heart block: Secondary | ICD-10-CM | POA: Diagnosis not present

## 2022-12-30 DIAGNOSIS — I447 Left bundle-branch block, unspecified: Secondary | ICD-10-CM | POA: Diagnosis not present

## 2022-12-30 DIAGNOSIS — I5032 Chronic diastolic (congestive) heart failure: Secondary | ICD-10-CM | POA: Diagnosis not present

## 2022-12-31 ENCOUNTER — Encounter: Payer: Self-pay | Admitting: Nurse Practitioner

## 2022-12-31 ENCOUNTER — Telehealth: Payer: Self-pay | Admitting: Nurse Practitioner

## 2022-12-31 NOTE — Telephone Encounter (Signed)
We can also place him on cancellation list for sooner visit but since he has improved I am okay with him being seen at a future visit

## 2022-12-31 NOTE — Telephone Encounter (Signed)
FYI

## 2022-12-31 NOTE — Progress Notes (Signed)
12/31/22 NAMBF vg

## 2022-12-31 NOTE — Telephone Encounter (Addendum)
Patient's friend called back stating triage informed her that patient just needs an appointment since symptoms seemed to have improved. Caller agrees that patient's symptoms have improved as well. Pt has been scheduled for 7/29 @ 4pm. Please advise on any further actions.  Patient Name First: Hunter Last: [name] Gender: Male DOB: 07-22-1933 Age: 89 Y 9 M 6 D Return Phone Number: [phone] (Primary) Address: [address] City/ State/ Zip: [place] [address] Client [facility] - Administrator, [facility] Provider Tana Conch- MD Contact Type Call Who Is Calling Patient / Member / Family / Caregiver Call Type Triage / Clinical Caller Name Andrey Spearman Relationship To Patient Friend Return Phone Number [phone] (Primary) Chief Complaint CONFUSION - new onset Reason for Call Symptomatic / Request for Health Information Initial Comment Caller states patient has been having irritability, confusion and hallucinations. Translation No Nurse Assessment Nurse: Annye English, RN, Denise Date/Time (Eastern Time): 12/31/2022 2:42:28 PM Confirm and document reason for call. If symptomatic, describe symptoms. ---Pcg reports pt has times when he gets confused and irritable but he has NO s/s today. States he hallucinated 1 wk ago but she thinks it was his vision issue and not true hallucinations. States visual disturbances make a bush in the yard appear as people sometimes with him. Pt states he has visual disturbances w/cataracts but he has further upcoming surgery. Pt states he feels fine today and has NO new/worsening s/s. He is A/O x 3 today. Does the patient have any new or worsening symptoms? ---No Please document clinical information provided and list any resource used. ---Trans to [facility] for appt. Disp. Time Lamount Cohen Time) Disposition Final User 12/31/2022 2:39:41 PM Send to Urgent Velna Ochs 12/31/2022 2:52:26 PM Clinical Call Yes Annye English, RN, Angelique Blonder Final Disposition 12/31/2022 2:52:26 PM Clinical Call Yes Annye English, RN, Angelique Blonder

## 2022-12-31 NOTE — Telephone Encounter (Signed)
  Hunter [name] said, she missed a call from [place]. She checking what we need from her or patient.

## 2022-12-31 NOTE — Telephone Encounter (Signed)
Patient's friend called back for triage.   ..FYI: This call has been transferred to Access Nurse. Once the result note has been entered staff can address the message at that time.  Patient called in with the following symptoms:  Red Word: Change in behavior, hallucinations, and memory issues   Please advise at Mobile [phone] (mobile)  Message is routed to Provider Pool and [facility] Triage

## 2023-01-06 DIAGNOSIS — Q246 Congenital heart block: Secondary | ICD-10-CM | POA: Diagnosis not present

## 2023-01-06 DIAGNOSIS — K219 Gastro-esophageal reflux disease without esophagitis: Secondary | ICD-10-CM | POA: Diagnosis not present

## 2023-01-06 DIAGNOSIS — G3184 Mild cognitive impairment, so stated: Secondary | ICD-10-CM | POA: Diagnosis not present

## 2023-01-06 DIAGNOSIS — N183 Chronic kidney disease, stage 3 unspecified: Secondary | ICD-10-CM | POA: Diagnosis not present

## 2023-01-06 DIAGNOSIS — R262 Difficulty in walking, not elsewhere classified: Secondary | ICD-10-CM | POA: Diagnosis not present

## 2023-01-06 DIAGNOSIS — H9193 Unspecified hearing loss, bilateral: Secondary | ICD-10-CM | POA: Diagnosis not present

## 2023-01-06 DIAGNOSIS — D696 Thrombocytopenia, unspecified: Secondary | ICD-10-CM | POA: Diagnosis not present

## 2023-01-06 DIAGNOSIS — M6281 Muscle weakness (generalized): Secondary | ICD-10-CM | POA: Diagnosis not present

## 2023-01-06 DIAGNOSIS — D631 Anemia in chronic kidney disease: Secondary | ICD-10-CM | POA: Diagnosis not present

## 2023-01-06 DIAGNOSIS — I739 Peripheral vascular disease, unspecified: Secondary | ICD-10-CM | POA: Diagnosis not present

## 2023-01-06 DIAGNOSIS — G629 Polyneuropathy, unspecified: Secondary | ICD-10-CM | POA: Diagnosis not present

## 2023-01-06 DIAGNOSIS — I5032 Chronic diastolic (congestive) heart failure: Secondary | ICD-10-CM | POA: Diagnosis not present

## 2023-01-06 DIAGNOSIS — I890 Lymphedema, not elsewhere classified: Secondary | ICD-10-CM | POA: Diagnosis not present

## 2023-01-06 DIAGNOSIS — I447 Left bundle-branch block, unspecified: Secondary | ICD-10-CM | POA: Diagnosis not present

## 2023-01-06 DIAGNOSIS — M4802 Spinal stenosis, cervical region: Secondary | ICD-10-CM | POA: Diagnosis not present

## 2023-01-06 DIAGNOSIS — I130 Hypertensive heart and chronic kidney disease with heart failure and stage 1 through stage 4 chronic kidney disease, or unspecified chronic kidney disease: Secondary | ICD-10-CM | POA: Diagnosis not present

## 2023-01-06 DIAGNOSIS — Z7982 Long term (current) use of aspirin: Secondary | ICD-10-CM | POA: Diagnosis not present

## 2023-01-06 DIAGNOSIS — Z9181 History of falling: Secondary | ICD-10-CM | POA: Diagnosis not present

## 2023-01-06 DIAGNOSIS — E559 Vitamin D deficiency, unspecified: Secondary | ICD-10-CM | POA: Diagnosis not present

## 2023-01-06 DIAGNOSIS — E039 Hypothyroidism, unspecified: Secondary | ICD-10-CM | POA: Diagnosis not present

## 2023-01-06 DIAGNOSIS — G8929 Other chronic pain: Secondary | ICD-10-CM | POA: Diagnosis not present

## 2023-01-13 ENCOUNTER — Emergency Department (HOSPITAL_COMMUNITY): Payer: Medicare Other

## 2023-01-13 ENCOUNTER — Other Ambulatory Visit: Payer: Self-pay | Admitting: Family Medicine

## 2023-01-13 ENCOUNTER — Emergency Department (HOSPITAL_COMMUNITY)
Admission: EM | Admit: 2023-01-13 | Discharge: 2023-01-14 | Disposition: A | Discharge status: Home / Self Care | Payer: Medicare Other | Attending: Emergency Medicine | Admitting: Emergency Medicine

## 2023-01-13 ENCOUNTER — Other Ambulatory Visit: Payer: Self-pay

## 2023-01-13 ENCOUNTER — Encounter (HOSPITAL_COMMUNITY): Payer: Self-pay | Admitting: Emergency Medicine

## 2023-01-13 DIAGNOSIS — E559 Vitamin D deficiency, unspecified: Secondary | ICD-10-CM | POA: Diagnosis not present

## 2023-01-13 DIAGNOSIS — D631 Anemia in chronic kidney disease: Secondary | ICD-10-CM | POA: Diagnosis not present

## 2023-01-13 DIAGNOSIS — I447 Left bundle-branch block, unspecified: Secondary | ICD-10-CM | POA: Diagnosis not present

## 2023-01-13 DIAGNOSIS — R262 Difficulty in walking, not elsewhere classified: Secondary | ICD-10-CM | POA: Diagnosis not present

## 2023-01-13 DIAGNOSIS — Z9181 History of falling: Secondary | ICD-10-CM | POA: Diagnosis not present

## 2023-01-13 DIAGNOSIS — F3341 Major depressive disorder, recurrent, in partial remission: Secondary | ICD-10-CM

## 2023-01-13 DIAGNOSIS — E039 Hypothyroidism, unspecified: Secondary | ICD-10-CM | POA: Diagnosis not present

## 2023-01-13 DIAGNOSIS — I130 Hypertensive heart and chronic kidney disease with heart failure and stage 1 through stage 4 chronic kidney disease, or unspecified chronic kidney disease: Secondary | ICD-10-CM | POA: Diagnosis not present

## 2023-01-13 DIAGNOSIS — D696 Thrombocytopenia, unspecified: Secondary | ICD-10-CM

## 2023-01-13 DIAGNOSIS — Z7982 Long term (current) use of aspirin: Secondary | ICD-10-CM | POA: Diagnosis not present

## 2023-01-13 DIAGNOSIS — I509 Heart failure, unspecified: Secondary | ICD-10-CM | POA: Insufficient documentation

## 2023-01-13 DIAGNOSIS — G3184 Mild cognitive impairment, so stated: Secondary | ICD-10-CM | POA: Diagnosis not present

## 2023-01-13 DIAGNOSIS — Q246 Congenital heart block: Secondary | ICD-10-CM | POA: Diagnosis not present

## 2023-01-13 DIAGNOSIS — I5033 Acute on chronic diastolic (congestive) heart failure: Secondary | ICD-10-CM

## 2023-01-13 DIAGNOSIS — N183 Chronic kidney disease, stage 3 unspecified: Secondary | ICD-10-CM | POA: Diagnosis not present

## 2023-01-13 DIAGNOSIS — I6782 Cerebral ischemia: Secondary | ICD-10-CM | POA: Diagnosis not present

## 2023-01-13 DIAGNOSIS — R443 Hallucinations, unspecified: Secondary | ICD-10-CM | POA: Diagnosis present

## 2023-01-13 DIAGNOSIS — I5032 Chronic diastolic (congestive) heart failure: Secondary | ICD-10-CM | POA: Diagnosis not present

## 2023-01-13 DIAGNOSIS — I739 Peripheral vascular disease, unspecified: Secondary | ICD-10-CM | POA: Diagnosis not present

## 2023-01-13 DIAGNOSIS — I890 Lymphedema, not elsewhere classified: Secondary | ICD-10-CM | POA: Diagnosis not present

## 2023-01-13 DIAGNOSIS — M6281 Muscle weakness (generalized): Secondary | ICD-10-CM | POA: Diagnosis not present

## 2023-01-13 DIAGNOSIS — K219 Gastro-esophageal reflux disease without esophagitis: Secondary | ICD-10-CM | POA: Diagnosis not present

## 2023-01-13 DIAGNOSIS — H9193 Unspecified hearing loss, bilateral: Secondary | ICD-10-CM | POA: Diagnosis not present

## 2023-01-13 DIAGNOSIS — H539 Unspecified visual disturbance: Secondary | ICD-10-CM

## 2023-01-13 DIAGNOSIS — G629 Polyneuropathy, unspecified: Secondary | ICD-10-CM | POA: Diagnosis not present

## 2023-01-13 DIAGNOSIS — M4802 Spinal stenosis, cervical region: Secondary | ICD-10-CM | POA: Diagnosis not present

## 2023-01-13 DIAGNOSIS — G8929 Other chronic pain: Secondary | ICD-10-CM | POA: Diagnosis not present

## 2023-01-13 LAB — COMPREHENSIVE METABOLIC PANEL
ALT: 18 U/L (ref 0–44)
AST: 23 U/L (ref 15–41)
Albumin: 4.1 g/dL (ref 3.5–5.0)
Alkaline Phosphatase: 72 U/L (ref 38–126)
Anion gap: 9 (ref 5–15)
BUN: 24 mg/dL — ABNORMAL HIGH (ref 8–23)
CO2: 24 mmol/L (ref 22–32)
Calcium: 8.9 mg/dL (ref 8.9–10.3)
Chloride: 105 mmol/L (ref 98–111)
Creatinine, Ser: 1.43 mg/dL — ABNORMAL HIGH (ref 0.61–1.24)
GFR, Estimated: 48 mL/min — ABNORMAL LOW (ref >60)
Glucose, Bld: 119 mg/dL — ABNORMAL HIGH (ref 70–99)
Potassium: 3.6 mmol/L (ref 3.5–5.1)
Sodium: 138 mmol/L (ref 135–145)
Total Bilirubin: 0.6 mg/dL (ref 0.3–1.2)
Total Protein: 6.4 g/dL — ABNORMAL LOW (ref 6.5–8.1)

## 2023-01-13 LAB — CBC
HCT: 41.3 % (ref 39.0–52.0)
Hemoglobin: 13.4 g/dL (ref 13.0–17.0)
MCH: 26.5 pg (ref 26.0–34.0)
MCHC: 32.4 g/dL (ref 30.0–36.0)
MCV: 81.6 fL (ref 80.0–100.0)
Platelets: 103 10*3/uL — ABNORMAL LOW (ref 150–400)
RBC: 5.06 MIL/uL (ref 4.22–5.81)
RDW: 14.8 % (ref 11.5–15.5)
WBC: 4.7 10*3/uL (ref 4.0–10.5)
nRBC: 0 % (ref 0.0–0.2)

## 2023-01-13 LAB — URINALYSIS, ROUTINE W REFLEX MICROSCOPIC
Bacteria, UA: NONE SEEN
Bilirubin Urine: NEGATIVE
Glucose, UA: NEGATIVE mg/dL
Hgb urine dipstick: NEGATIVE
Ketones, ur: NEGATIVE mg/dL
Leukocytes,Ua: NEGATIVE
Nitrite: NEGATIVE
Protein, ur: 30 mg/dL — AB
Specific Gravity, Urine: 1.019 (ref 1.005–1.030)
pH: 6 (ref 5.0–8.0)

## 2023-01-13 LAB — RAPID URINE DRUG SCREEN, HOSP PERFORMED
Amphetamines: NOT DETECTED
Barbiturates: NOT DETECTED
Benzodiazepines: NOT DETECTED
Cocaine: NOT DETECTED
Opiates: NOT DETECTED
Tetrahydrocannabinol: NOT DETECTED

## 2023-01-13 LAB — ACETAMINOPHEN LEVEL: Acetaminophen (Tylenol), Serum: <10 ug/mL — ABNORMAL LOW (ref 10–30)

## 2023-01-13 LAB — ETHANOL: Alcohol, Ethyl (B): <10 mg/dL (ref <10)

## 2023-01-13 LAB — SALICYLATE LEVEL: Salicylate Lvl: <7 mg/dL — ABNORMAL LOW (ref 7.0–30.0)

## 2023-01-13 MED ORDER — SERTRALINE HCL 50 MG PO TABS
200.0000 mg | ORAL_TABLET | Freq: Every day | ORAL | Status: DC
  Administered 2023-01-13 – 2023-01-14 (×2): 200 mg via ORAL
  Filled 2023-01-13 (×2): qty 4

## 2023-01-13 MED ORDER — ASPIRIN 81 MG PO CHEW
81.0000 mg | CHEWABLE_TABLET | Freq: Every day | ORAL | Status: DC
  Administered 2023-01-13 – 2023-01-14 (×2): 81 mg via ORAL
  Filled 2023-01-13 (×2): qty 1

## 2023-01-13 MED ORDER — VITAMIN D (ERGOCALCIFEROL) 1.25 MG (50000 UNIT) PO CAPS
50000.0000 [IU] | ORAL_CAPSULE | ORAL | Status: DC
  Administered 2023-01-13: 50000 [IU] via ORAL
  Filled 2023-01-13: qty 1

## 2023-01-13 MED ORDER — LOSARTAN POTASSIUM 25 MG PO TABS
50.0000 mg | ORAL_TABLET | Freq: Every day | ORAL | Status: DC
  Administered 2023-01-13 – 2023-01-14 (×2): 50 mg via ORAL
  Filled 2023-01-13 (×2): qty 2

## 2023-01-13 MED ORDER — AMLODIPINE BESYLATE 5 MG PO TABS
2.5000 mg | ORAL_TABLET | Freq: Every day | ORAL | Status: DC
  Administered 2023-01-13 – 2023-01-14 (×2): 2.5 mg via ORAL
  Filled 2023-01-13 (×2): qty 1

## 2023-01-13 MED ORDER — FUROSEMIDE 40 MG PO TABS
60.0000 mg | ORAL_TABLET | Freq: Every day | ORAL | Status: DC
  Administered 2023-01-13 – 2023-01-14 (×2): 60 mg via ORAL
  Filled 2023-01-13 (×2): qty 2

## 2023-01-13 MED ORDER — LEVOTHYROXINE SODIUM 50 MCG PO TABS
75.0000 ug | ORAL_TABLET | Freq: Every morning | ORAL | Status: DC
  Administered 2023-01-14: 75 ug via ORAL
  Filled 2023-01-13: qty 2

## 2023-01-13 MED ORDER — PANTOPRAZOLE SODIUM 40 MG PO TBEC
40.0000 mg | DELAYED_RELEASE_TABLET | Freq: Every day | ORAL | Status: DC
  Administered 2023-01-13 – 2023-01-14 (×2): 40 mg via ORAL
  Filled 2023-01-13 (×2): qty 1

## 2023-01-13 MED ORDER — CEPHALEXIN 500 MG PO CAPS
500.0000 mg | ORAL_CAPSULE | Freq: Four times a day (QID) | ORAL | Status: DC
  Administered 2023-01-13 – 2023-01-14 (×4): 500 mg via ORAL
  Filled 2023-01-13 (×4): qty 1

## 2023-01-13 MED ORDER — ATORVASTATIN CALCIUM 10 MG PO TABS
20.0000 mg | ORAL_TABLET | Freq: Every day | ORAL | Status: DC
  Administered 2023-01-13 – 2023-01-14 (×2): 20 mg via ORAL
  Filled 2023-01-13 (×2): qty 2

## 2023-01-13 NOTE — BH Assessment (Signed)
[name] will be seeing this patient at midnight. Dr. Alto Denver with [name] will be seeing him. RN Forest Gleason informed.

## 2023-01-13 NOTE — ED Provider Notes (Cosign Needed Addendum)
[place] EMERGENCY DEPARTMENT AT [facility] Provider Note   CSN: [id number] Arrival date & time: 01/13/23  1424     History  Chief Complaint  Patient presents with   Medical Clearance    Hunter [name] is a 89 y.o. male.  Has past medical history of high blood pressure, high cholesterol, CHF on Lasix.  He presents today in the company of his neighbor who checks on him frequently and helps take care of him.  She states for the past several days he has been having hallucinations and seeing people who are not there, he has been seeing "3 people in his closet" and his son who is changed from the patient sleeping in his bed, the neighbor has been in the house but this has been going on and there is nobody there, he denies any confusion, fevers or chills, chest pain or shortness of breath, nausea or vomiting.  Health nurse was also concerned about patient to have psych eval.  He is never had anything like this in the past, he does have history of depression and states he takes his Zoloft.  HPI     Home Medications Prior to Admission medications   Medication Sig Start Date End Date Taking? Authorizing Provider  amLODipine (NORVASC) 2.5 MG tablet TAKE 1 TABLET BY MOUTH DAILY 11/25/22   Shelva Majestic, MD  aspirin 81 MG chewable tablet Chew 81 mg by mouth daily.     [provider]  atorvastatin (LIPITOR) 20 MG tablet TAKE 1 TABLET BY MOUTH DAILY 06/22/22   Shelva Majestic, MD  Cyanocobalamin (B-12 PO) Take 1 tablet by mouth daily.    [provider]  ferrous sulfate 325 (65 FE) MG tablet Take 325 mg by mouth daily with breakfast.    [provider]  furosemide (LASIX) 20 MG tablet TAKE 3 TABLETS BY MOUTH DAILY 11/25/22   Shelva Majestic, MD  levothyroxine (SYNTHROID) 75 MCG tablet TAKE 1 TABLET BY MOUTH EVERY MORNING 01/13/23   Shelva Majestic, MD  losartan (COZAAR) 50 MG tablet TAKE 1 TABLET BY MOUTH DAILY 01/13/23   Shelva Majestic, MD   pantoprazole (PROTONIX) 40 MG tablet Take 1 tablet (40 mg total) by mouth daily. Patient not taking: Reported on 11/26/2022 06/18/21 09/01/88  Vassie Loll, MD  sertraline (ZOLOFT) 100 MG tablet TAKE 2 TABLETS BY MOUTH DAILY 11/25/22   Shelva Majestic, MD  Vitamin D, Ergocalciferol, (DRISDOL) 1.25 MG (50000 UNIT) CAPS capsule TAKE 1 CAPSULE BY MOUTH EVERY 7  DAYS 12/10/22   Shelva Majestic, MD      Allergies    Patient has no known allergies.    Review of Systems   Review of Systems  Physical Exam Updated Vital Signs BP 119/61 (BP Location: Left Arm)   Pulse 62   Temp 98.2 F (36.8 C)   Resp 18   Ht 5\' 11"  (1.803 m)   Wt 104.3 kg   SpO2 97%   BMI 32.08 kg/m  Physical Exam Vitals and nursing note reviewed.  Constitutional:      General: He is not in acute distress.    Appearance: He is well-developed.  HENT:     Head: Normocephalic and atraumatic.  Eyes:     Conjunctiva/sclera: Conjunctivae normal.  Cardiovascular:     Rate and Rhythm: Normal rate and regular rhythm.     Heart sounds: No murmur heard. Pulmonary:     Effort: Pulmonary effort is normal. No respiratory  distress.     Breath sounds: Normal breath sounds.  Abdominal:     Palpations: Abdomen is soft.     Tenderness: There is no abdominal tenderness.  Musculoskeletal:        General: No swelling.     Cervical back: Neck supple.  Skin:    General: Skin is warm and dry.     Capillary Refill: Capillary refill takes less than 2 seconds.  Neurological:     General: No focal deficit present.     Mental Status: He is alert and oriented to person, place, and time.  Psychiatric:        Mood and Affect: Mood normal.     ED Results / Procedures / Treatments   Labs (all labs ordered are listed, but only abnormal results are displayed) Labs Reviewed  COMPREHENSIVE METABOLIC PANEL - Abnormal; Notable for the following components:      Result Value   Glucose, Bld 119 (*)    BUN 24 (*)    Creatinine, Ser  1.43 (*)    Total Protein 6.4 (*)    GFR, Estimated 48 (*)    All other components within normal limits  SALICYLATE LEVEL - Abnormal; Notable for the following components:   Salicylate Lvl <7.0 (*)    All other components within normal limits  ACETAMINOPHEN LEVEL - Abnormal; Notable for the following components:   Acetaminophen (Tylenol), Serum <10 (*)    All other components within normal limits  CBC - Abnormal; Notable for the following components:   Platelets 103 (*)    All other components within normal limits  URINALYSIS, ROUTINE W REFLEX MICROSCOPIC - Abnormal; Notable for the following components:   Protein, ur 30 (*)    All other components within normal limits  ETHANOL  RAPID URINE DRUG SCREEN, HOSP PERFORMED    EKG None  Radiology CT Head Wo Contrast  Result Date: 01/13/2023 CLINICAL DATA:  Altered mental status EXAM: CT HEAD WITHOUT CONTRAST TECHNIQUE: Contiguous axial images were obtained from the base of the skull through the vertex without intravenous contrast. RADIATION DOSE REDUCTION: This exam was performed according to the departmental dose-optimization program which includes automated exposure control, adjustment of the mA and/or kV according to patient size and/or use of iterative reconstruction technique. COMPARISON:  CT brain 08/06/2020 report FINDINGS: Brain: No acute territorial infarction, hemorrhage or intracranial mass. Atrophy and mild chronic small vessel ischemic changes of the white matter. Nonenlarged ventricles Vascular: No hyperdense vessels.  Carotid vascular calcification Skull: Normal. Negative for fracture or focal lesion. Sinuses/Orbits: No acute finding. Other: None IMPRESSION: 1. No CT evidence for acute intracranial abnormality. 2. Atrophy and mild chronic small vessel ischemic changes of the white matter. Electronically Signed   By: Jasmine Pang M.D.   On: 01/13/2023 18:43    Procedures Procedures    Medications Ordered in ED Medications -  No data to display  ED Course/ Medical Decision Making/ A&P                             Medical Decision Making Amount and/or Complexity of Data Reviewed Labs: ordered. Radiology: ordered.  Risk OTC drugs. Prescription drug management.   DDx: Dementia, encephalopathy, dizziness, other Labs: Normal labs overall, renal function at baseline, no anemia or leukocytosis, no UTI, negative UDS Tylenol level salicylate level CT head ordered due to new onset hallucinations and patient's age that shows small vessel changes but no acute changes.  Patient may be considering some dementia related psychosis, he is alert and oriented x 4, able to answer all my questions, willing to stay for further evaluation at this time.  Eating TTS consult. No SI/HI  Does have some redness to the right great toe and soreness after kicking something, unable was worried about cellulitis, will start Keflex.       Final Clinical Impression(s) / ED Diagnoses Final diagnoses:  None    Rx / DC Orders ED Discharge Orders     None         Ma Rings, PA-C 01/13/23 [address] 01/13/23 2004    Vanetta Mulders, MD 01/14/23 1257

## 2023-01-13 NOTE — ED Triage Notes (Signed)
Pt via POV with a neighbor stating pt's home health nurse came to see him this morning and recommends psych eval. Pt has been seeing things and talking to people that aren't really there, has seemed very confused and agitated, and presents a safety concern. This nurse is Judeth Cornfield [phone]

## 2023-01-14 ENCOUNTER — Encounter (HOSPITAL_COMMUNITY): Payer: Self-pay | Admitting: Psychiatric/Mental Health

## 2023-01-14 DIAGNOSIS — R443 Hallucinations, unspecified: Secondary | ICD-10-CM

## 2023-01-14 DIAGNOSIS — I5033 Acute on chronic diastolic (congestive) heart failure: Secondary | ICD-10-CM | POA: Insufficient documentation

## 2023-01-14 NOTE — Progress Notes (Signed)
Per Assunta Gambles, NP [facility] pt has been psych cleared. This CSW will now remove from the [facility] shift report. TOC to assist with any identified discharge needs.   Maryjean Ka, MSW, [facility] 01/14/2023 6:41 AM

## 2023-01-14 NOTE — ED Notes (Addendum)
Patient being evaluated by Esau Grew, NP

## 2023-01-14 NOTE — ED Notes (Signed)
Attempted to call neighbor Andrey Spearman in regards to patient discharge. No answer and voice mailbox is full

## 2023-01-14 NOTE — ED Notes (Signed)
Attempted to call neighbor Betty Roberts to pick pt up, no answer at this time and voicemail is full.    

## 2023-01-14 NOTE — ED Notes (Signed)
Breakfast tray placed, pt feeding self.

## 2023-01-14 NOTE — ED Provider Notes (Signed)
Patient evaluated by [name], Hunter [name], who feels his symptoms may be due to his known retinal/ophthalmologic problems and not due to an underlying psychiatric illness. She has cleared him for discharge. He is currently sleeping soundly, will plan to discharge him in the AM when a ride is available.    Pollyann Savoy, MD 01/14/23 [phone]

## 2023-01-14 NOTE — ED Notes (Signed)
Pt lives alone and is alert and oriented x 4. [facility] called to transport pt home. They report they will be here in approximately 1-1.5 hours.

## 2023-01-14 NOTE — Discharge Instructions (Addendum)
It was a pleasure caring for you today in the emergency department. ° °Please return to the emergency department for any worsening or worrisome symptoms. ° ° °

## 2023-01-14 NOTE — Consult Note (Signed)
[name] Consult Note  Patient Name: Hunter [name] MRN: [id number] DOB: 02-14-1934 DATE OF Consult: 01/14/2023  PRIMARY PSYCHIATRIC DIAGNOSES  1.  Hallucinations, unspecified   2.   3.    RECOMMENDATIONS  Recommendations: Medication recommendations: None indicated at this time; would like follow-up with eye specialist before considering medication  Non-Medication/therapeutic recommendations: follow-up with eye specialist, may consider referral for neurology for neurocognitive impairment  Is inpatient psychiatric hospitalization recommended for this patient? No (Explain why): Patient is not an imminent harm to self or others  Communication: Treatment team members (and family members if applicable) who were involved in treatment/care discussions and planning, and with whom we spoke or engaged with via secure text/chat, include the following: Discussed with Dr. Bernette Mayers  Thank you for involving [place] in the care of this patient. If you have any additional questions or concerns, please call [phone] and ask for me or the provider on-call.   TELEPSYCHIATRY ATTESTATION & CONSENT  As the provider for this telehealth consult, I attest that I verified the patient's identity using two separate identifiers, introduced myself to the patient, provided my credentials, disclosed my location, and performed this encounter via a HIPAA-compliant, real-time, face-to-face, two-way, interactive audio and video platform and with the full consent and agreement of the patient (or guardian as applicable.)  Patient physical location: ED in [facility]. Telehealth provider physical location: home office in state of [place].  Video start time: 2306 (Central Time) Video end time: 2330 (Central Time)  IDENTIFYING DATA  Hunter [name] is a 89 y.o. year-old male for whom a psychiatric consultation has been ordered by the primary provider. The patient was identified using two separate identifiers.   CHIEF COMPLAINT/REASON FOR CONSULT  Hallucinations    HISTORY OF PRESENT ILLNESS (HPI)  The patient is a 89yo male who presented to the emergency department this evening, accompanied by his neighbor who check on him frequently and helps take care of him. She reported the patient has been experiencing hallucinations and seeing people who are not there for the past several days. No past history of psychosis, history of Depression and is prescribed Zoloft.   Patient is calm and cooperative, difficulty hearing throughout assessment. He does not have his hearing aids in at this time. He is oriented to person, month, place, and situation. He tells me the year is "56 and 24". Patient states he came to the hospital  because "it was really an imagination thing. I don't know why I had that thinking.  I've been tired, and worn down, and thinking that it may be an illusion". Patient states about 2-3 weeks ago he started seeing 3 individuals in his home. He states he sees them in an area where he "stored junk" in a closet, light was on. States he saw a male, male, and a child. Has only seen these individuals in this certain area. He believes these occurences are due to trouble sleeping, lack of, due to frequent urination needs throughout the night. He states he has not had any hallucinations in 2 weeks. Denies auditory hallucinations. Patient feels it also is related to "straining on my eyes". He seems to understand these individuals are not real and he "let it get to me but it's not possible that they are real". Patient states he has trouble with his vision. Had cataract surgery on one eye and has had to receive injections in his eyes due to pressure and "blood". Struggles with his vision still. He believes  he has an upcoming eye surgery.   No past psychosis. No suicidal or homicidal ideations. He does admit that he feels more forgetful at times. Lives alone and has been able to manage well. Denies recent  depression, anxiety.   Per chart review, patient reported to Dr. [name] on 6/20 that he was having visual disturbances and at times gets confused. Reported visual disturbances as a bush in the yard appeared as people sometimes to him. Document states that he has visual disturbances with cataracts but he has further upcoming surgery. History of mild neurocognitive disorder in chart.       PAST PSYCHIATRIC HISTORY  No past psychosis. Per chart review, history of Depression. No past psychiatric hospitalizations. No past suicide attempts.   Otherwise as per HPI above.  PAST MEDICAL HISTORY  Past Medical History:  Diagnosis Date   Allergic rhinitis 02/02/2007   Anemia    Arthritis    "knees" (02/08/2017)   Chronic lower back pain    CKD (chronic kidney disease), stage III (HCC) 05/27/2011   Complete heart block 02/08/2017   Eczema    Essential hypertension 02/02/2007   Lasix 60 mg, losartan 50mg ,  Amlodipine 5mg --> 2.5 mg.  Usually have to repeat BP measurements  In the past-Maxzide 37.5-25mg .   GERD (gastroesophageal reflux disease) occasional   Hearing loss of both ears    Heart failure with preserved ejection fraction (HFpEF) 12/06/2013   History of skin cancer 03/26/2014   nose    Hx of echocardiogram    Echo (07/2013): Mild LVH, EF 55-60%, grade 1 diastolic dysfunction, mild LAE, PASP 23   Hyperlipidemia    Hypertension    Hypothyroidism    LBBB (left bundle branch block)    Left patella fracture 2018   "no OR" (02/08/2017)   Major depression in partial remission 02/02/2007   Amitriptyline 25mg  for sleep (stop when runs out of #10 04/2018(, zoloft 100-->150mg --> 200mg    Mild neurocognitive disorder 06/21/2019   Neuropathy (HCC) 11/02/2011   Nocturia    Peripheral vascular disease (HCC) LOWER EXTREMITIES   Spinal stenosis in cervical region 03/21/2010   Squamous cell skin cancer, penis: glans (HCC) 05/2010   Initial excision 11/11; recurrence, excision and laser Rx 9/13    Thrombocytopenia (HCC) 05/23/2012   Urinary frequency 09/05/2009   Possible BPH- see Artist Pais notes    Vitamin D deficiency 08/15/2008     HOME MEDICATIONS  Facility Ordered Medications  Medication   amLODipine (NORVASC) tablet 2.5 mg   aspirin chewable tablet 81 mg   atorvastatin (LIPITOR) tablet 20 mg   furosemide (LASIX) tablet 60 mg   levothyroxine (SYNTHROID) tablet 75 mcg   losartan (COZAAR) tablet 50 mg   pantoprazole (PROTONIX) EC tablet 40 mg   sertraline (ZOLOFT) tablet 200 mg   Vitamin D (Ergocalciferol) (DRISDOL) 1.25 MG (50000 UNIT) capsule 50,000 Units   cephALEXin (KEFLEX) capsule 500 mg   PTA Medications  Medication Sig   ferrous sulfate 325 (65 FE) MG tablet Take 325 mg by mouth daily with breakfast.   aspirin 81 MG chewable tablet Chew 81 mg by mouth daily.    pantoprazole (PROTONIX) 40 MG tablet Take 1 tablet (40 mg total) by mouth daily. (Patient not taking: Reported on 11/26/2022)   Cyanocobalamin (B-12 PO) Take 1 tablet by mouth daily.   atorvastatin (LIPITOR) 20 MG tablet TAKE 1 TABLET BY MOUTH DAILY   sertraline (ZOLOFT) 100 MG tablet TAKE 2 TABLETS BY MOUTH DAILY   furosemide (LASIX) 20 MG  tablet TAKE 3 TABLETS BY MOUTH DAILY   amLODipine (NORVASC) 2.5 MG tablet TAKE 1 TABLET BY MOUTH DAILY   Vitamin D, Ergocalciferol, (DRISDOL) 1.25 MG (50000 UNIT) CAPS capsule TAKE 1 CAPSULE BY MOUTH EVERY 7  DAYS     ALLERGIES  No Known Allergies  SOCIAL & SUBSTANCE USE HISTORY  Social History   Socioeconomic History   Marital status: Divorced    Spouse name: Not on file   Number of children: 2   Years of education: 16   Highest education level: Bachelor's degree (e.g., BA, AB, BS)  Occupational History   Occupation: Retired  Tobacco Use   Smoking status: Never    Passive exposure: Never   Smokeless tobacco: Never   Tobacco comments:    Verified by Andrey Spearman  Vaping Use   Vaping Use: Never used  Substance and Sexual Activity   Alcohol use: Yes    Comment:  02/08/2017 "nothing in the 2000s"   Drug use: No   Sexual activity: Not Currently  Other Topics Concern   Not on file  Social History Narrative   Lives alone. Has a cat and a dog      Ambulates with cane      Right handed      Former IT trainer   Social Determinants of Health   Financial Resource Strain: Low Risk  (11/26/2022)   Overall Financial Resource Strain (CARDIA)    Difficulty of Paying Living Expenses: Not hard at all  Food Insecurity: No Food Insecurity (11/26/2022)   Hunger Vital Sign    Worried About Running Out of Food in the Last Year: Never true    Ran Out of Food in the Last Year: Never true  Transportation Needs: No Transportation Needs (11/26/2022)   PRAPARE - Administrator, Civil Service (Medical): No    Lack of Transportation (Non-Medical): No  Physical Activity: Inactive (11/26/2022)   Exercise Vital Sign    Days of Exercise per Week: 0 days    Minutes of Exercise per Session: 0 min  Stress: Stress Concern Present (11/26/2022)   [employer] of Occupational Health - Occupational Stress Questionnaire    Feeling of Stress : To some extent  Social Connections: Socially Isolated (11/26/2022)   Social Connection and Isolation Panel [NHANES]    Frequency of Communication with Friends and Family: Twice a week    Frequency of Social Gatherings with Friends and Family: Once a week    Attends Religious Services: Never    Database administrator or Organizations: No    Attends Engineer, structural: Never    Marital Status: Divorced   Social History   Tobacco Use  Smoking Status Never   Passive exposure: Never  Smokeless Tobacco Never  Tobacco Comments   Verified by Andrey Spearman   Social History   Substance and Sexual Activity  Alcohol Use Yes   Comment: 02/08/2017 "nothing in the 2000s"   Social History   Substance and Sexual Activity  Drug Use No    Additional pertinent information lives alone  FAMILY HISTORY  Family History   Problem Relation Age of Onset   Lung cancer Mother    Arthritis Other        family hx   Hyperlipidemia Other        family hx   Hypertension Other        family hx   Prostate cancer Other        family hx  Family Psychiatric History (if known):  None  MENTAL STATUS EXAM (MSE)  Presentation  General Appearance: Appropriate for Environment Eye Contact:Good Speech:Clear and Coherent; Slow Speech Volume:Normal Handedness:No data recorded  Mood and Affect  Mood:-- ("pretty good") Affect:Appropriate  Thought Process  Thought Processes:Coherent; Linear Descriptions of Associations:Intact  Orientation:-- (Oriented to person, place, situation, month)  Thought Content:Logical  History of Schizophrenia/Schizoaffective disorder:No data recorded Duration of Psychotic Symptoms:No data recorded Hallucinations:Hallucinations: Visual Description of Visual Hallucinations: seeing people in his closet  Ideas of Reference:None  Suicidal Thoughts:Suicidal Thoughts: No  Homicidal Thoughts:Homicidal Thoughts: No   Sensorium  Memory:Recent Fair Judgment:Fair Insight:Good  Executive Functions  Concentration:Good Attention Span:Good Recall:Fair Fund of Knowledge:Fair Language:Good  Psychomotor Activity  Psychomotor Activity:Psychomotor Activity: Normal  Assets  Assets:Communication Skills; Desire for Improvement; Housing; Social Support  Sleep  Sleep:Sleep: Poor (complains of frequent awakenings to urinate)   VITALS  Blood pressure 119/61, pulse 62, temperature 98.2 F (36.8 C), resp. rate 18, height 5\' 11"  (1.803 m), weight 104.3 kg, SpO2 97 %.  LABS  Admission on 01/13/2023  Component Date Value Ref Range Status   Sodium 01/13/2023 138  135 - 145 mmol/L Final   Potassium 01/13/2023 3.6  3.5 - 5.1 mmol/L Final   Chloride 01/13/2023 105  98 - 111 mmol/L Final   CO2 01/13/2023 24  22 - 32 mmol/L Final   Glucose, Bld 01/13/2023 119 (H)  70 - 99 mg/dL Final   Glucose  reference range applies only to samples taken after fasting for at least 8 hours.   BUN 01/13/2023 24 (H)  8 - 23 mg/dL Final   Creatinine, Ser 01/13/2023 1.43 (H)  0.61 - 1.24 mg/dL Final   Calcium 81/19/1478 8.9  8.9 - 10.3 mg/dL Final   Total Protein 29/56/2130 6.4 (L)  6.5 - 8.1 g/dL Final   Albumin 86/57/8469 4.1  3.5 - 5.0 g/dL Final   AST 62/95/2841 23  15 - 41 U/L Final   ALT 01/13/2023 18  0 - 44 U/L Final   Alkaline Phosphatase 01/13/2023 72  38 - 126 U/L Final   Total Bilirubin 01/13/2023 0.6  0.3 - 1.2 mg/dL Final   GFR, Estimated 01/13/2023 48 (L)  >60 mL/min Final   Comment: (NOTE) Calculated using the CKD-EPI Creatinine Equation (2021)    Anion gap 01/13/2023 9  5 - 15 Final   Performed at [facility], [address] [place], [place] [address]   Alcohol, Ethyl (B) 01/13/2023 <10  <10 mg/dL Final   Comment: (NOTE) Lowest detectable limit for serum alcohol is 10 mg/dL.  For medical purposes only. Performed at [facility], [address], [place], [place] [address]    Salicylate Lvl 01/13/2023 <7.0 (L)  7.0 - 30.0 mg/dL Final   Performed at [facility], [address], [place], [place] [address]   Acetaminophen (Tylenol), Serum 01/13/2023 <10 (L)  10 - 30 ug/mL Final   Comment: (NOTE) Therapeutic concentrations vary significantly. A range of 10-30 ug/mL  may be an effective concentration for many patients. However, some  are best treated at concentrations outside of this range. Acetaminophen concentrations >150 ug/mL at 4 hours after ingestion  and >50 ug/mL at 12 hours after ingestion are often associated with  toxic reactions.  Performed at [facility], [address] [place], [place] [address]    WBC 01/13/2023 4.7  4.0 - 10.5 K/uL Final   RBC 01/13/2023 5.06  4.22 - 5.81 MIL/uL Final   Hemoglobin 01/13/2023 13.4  13.0 -  17.0 g/dL Final   HCT 11/91/4782 41.3  39.0 - 52.0 % Final   MCV 01/13/2023 81.6  80.0 - 100.0 fL Final   MCH 01/13/2023 26.5  26.0 - 34.0  pg Final   MCHC 01/13/2023 32.4  30.0 - 36.0 g/dL Final   RDW 95/62/1308 14.8  11.5 - 15.5 % Final   Platelets 01/13/2023 103 (L)  150 - 400 K/uL Final   Comment: SPECIMEN CHECKED FOR CLOTS REPEATED TO VERIFY    nRBC 01/13/2023 0.0  0.0 - 0.2 % Final   Performed at [facility], [address], [place], [place] [address]   Opiates 01/13/2023 NONE DETECTED  NONE DETECTED Final   Cocaine 01/13/2023 NONE DETECTED  NONE DETECTED Final   Benzodiazepines 01/13/2023 NONE DETECTED  NONE DETECTED Final   Amphetamines 01/13/2023 NONE DETECTED  NONE DETECTED Final   Tetrahydrocannabinol 01/13/2023 NONE DETECTED  NONE DETECTED Final   Barbiturates 01/13/2023 NONE DETECTED  NONE DETECTED Final   Comment: (NOTE) DRUG SCREEN FOR MEDICAL PURPOSES ONLY.  IF CONFIRMATION IS NEEDED FOR ANY PURPOSE, NOTIFY LAB WITHIN 5 DAYS.  LOWEST DETECTABLE LIMITS FOR URINE DRUG SCREEN Drug Class                     Cutoff (ng/mL) Amphetamine and metabolites    1000 Barbiturate and metabolites    200 Benzodiazepine                 200 Opiates and metabolites        300 Cocaine and metabolites        300 THC                            50 Performed at [facility], [address] [place], [place] [address]    Color, Urine 01/13/2023 YELLOW  YELLOW Final   APPearance 01/13/2023 CLEAR  CLEAR Final   Specific Gravity, Urine 01/13/2023 1.019  1.005 - 1.030 Final   pH 01/13/2023 6.0  5.0 - 8.0 Final   Glucose, UA 01/13/2023 NEGATIVE  NEGATIVE mg/dL Final   Hgb urine dipstick 01/13/2023 NEGATIVE  NEGATIVE Final   Bilirubin Urine 01/13/2023 NEGATIVE  NEGATIVE Final   Ketones, ur 01/13/2023 NEGATIVE  NEGATIVE mg/dL Final   Protein, ur 52/84/1324 30 (A)  NEGATIVE mg/dL Final   Nitrite 40/04/2724 NEGATIVE  NEGATIVE Final   Leukocytes,Ua 01/13/2023 NEGATIVE  NEGATIVE Final   RBC / HPF 01/13/2023 0-5  0 - 5 RBC/hpf Final   WBC, UA 01/13/2023 0-5  0 - 5 WBC/hpf Final   Bacteria, UA 01/13/2023 NONE SEEN  NONE SEEN Final    Squamous Epithelial / HPF 01/13/2023 0-5  0 - 5 /HPF Final   Mucus 01/13/2023 PRESENT   Final   Performed at [facility], [address] [place], [place] [address]    PSYCHIATRIC REVIEW OF SYSTEMS (ROS)  ROS: Notable for the following relevant positive findings: Review of Systems  Psychiatric/Behavioral:  Positive for hallucinations and memory loss. The patient has insomnia.     Additional findings:      Musculoskeletal: No abnormal movements observed      Gait & Station: Laying/Sitting      Pain Screening: Denies      Nutrition & Dental Concerns: No concerns at this time  RISK FORMULATION/ASSESSMENT  Is the patient experiencing any suicidal or homicidal ideations: No       Explain if yes:  Protective factors considered  for safety management: access to care, support system, desire for improvement, no SI/HI  Risk factors/concerns considered for safety management:  Depression Physical illness/chronic pain Age over 84 Male gender  Is there a safety management plan with the patient and treatment team to minimize risk factors and promote protective factors: Yes           Explain: Recommend follow-up with eye specialist, recommend referral for neurology.  Is crisis care placement or psychiatric hospitalization recommended: No     Based on my current evaluation and risk assessment, patient is determined at this time to be at:  Low risk  *RISK ASSESSMENT Risk assessment is a dynamic process; it is possible that this patient's condition, and risk level, may change. This should be re-evaluated and managed over time as appropriate. Please re-consult psychiatric consult services if additional assistance is needed in terms of risk assessment and management. If your team decides to discharge this patient, please advise the patient how to best access emergency psychiatric services, or to call 911, if their condition worsens or they feel unsafe in any way.   Assunta Gambles, NP Telepsychiatry  Consult Services

## 2023-01-14 NOTE — ED Notes (Addendum)
Attempted to call neighbor Andrey Spearman to pick pt up, no answer at this time and voicemail is full.

## 2023-01-20 ENCOUNTER — Telehealth: Payer: Self-pay

## 2023-01-20 ENCOUNTER — Ambulatory Visit (INDEPENDENT_AMBULATORY_CARE_PROVIDER_SITE_OTHER): Payer: Medicare Other

## 2023-01-20 DIAGNOSIS — R262 Difficulty in walking, not elsewhere classified: Secondary | ICD-10-CM | POA: Diagnosis not present

## 2023-01-20 DIAGNOSIS — E039 Hypothyroidism, unspecified: Secondary | ICD-10-CM | POA: Diagnosis not present

## 2023-01-20 DIAGNOSIS — K219 Gastro-esophageal reflux disease without esophagitis: Secondary | ICD-10-CM | POA: Diagnosis not present

## 2023-01-20 DIAGNOSIS — Z9181 History of falling: Secondary | ICD-10-CM | POA: Diagnosis not present

## 2023-01-20 DIAGNOSIS — I5032 Chronic diastolic (congestive) heart failure: Secondary | ICD-10-CM | POA: Diagnosis not present

## 2023-01-20 DIAGNOSIS — Q246 Congenital heart block: Secondary | ICD-10-CM | POA: Diagnosis not present

## 2023-01-20 DIAGNOSIS — M6281 Muscle weakness (generalized): Secondary | ICD-10-CM | POA: Diagnosis not present

## 2023-01-20 DIAGNOSIS — D631 Anemia in chronic kidney disease: Secondary | ICD-10-CM | POA: Diagnosis not present

## 2023-01-20 DIAGNOSIS — M4802 Spinal stenosis, cervical region: Secondary | ICD-10-CM | POA: Diagnosis not present

## 2023-01-20 DIAGNOSIS — I890 Lymphedema, not elsewhere classified: Secondary | ICD-10-CM | POA: Diagnosis not present

## 2023-01-20 DIAGNOSIS — E559 Vitamin D deficiency, unspecified: Secondary | ICD-10-CM | POA: Diagnosis not present

## 2023-01-20 DIAGNOSIS — I442 Atrioventricular block, complete: Secondary | ICD-10-CM

## 2023-01-20 DIAGNOSIS — I447 Left bundle-branch block, unspecified: Secondary | ICD-10-CM | POA: Diagnosis not present

## 2023-01-20 DIAGNOSIS — Z7982 Long term (current) use of aspirin: Secondary | ICD-10-CM | POA: Diagnosis not present

## 2023-01-20 DIAGNOSIS — H9193 Unspecified hearing loss, bilateral: Secondary | ICD-10-CM | POA: Diagnosis not present

## 2023-01-20 DIAGNOSIS — N183 Chronic kidney disease, stage 3 unspecified: Secondary | ICD-10-CM | POA: Diagnosis not present

## 2023-01-20 DIAGNOSIS — I739 Peripheral vascular disease, unspecified: Secondary | ICD-10-CM | POA: Diagnosis not present

## 2023-01-20 DIAGNOSIS — D696 Thrombocytopenia, unspecified: Secondary | ICD-10-CM | POA: Diagnosis not present

## 2023-01-20 DIAGNOSIS — G3184 Mild cognitive impairment, so stated: Secondary | ICD-10-CM | POA: Diagnosis not present

## 2023-01-20 DIAGNOSIS — G8929 Other chronic pain: Secondary | ICD-10-CM | POA: Diagnosis not present

## 2023-01-20 DIAGNOSIS — I130 Hypertensive heart and chronic kidney disease with heart failure and stage 1 through stage 4 chronic kidney disease, or unspecified chronic kidney disease: Secondary | ICD-10-CM | POA: Diagnosis not present

## 2023-01-20 DIAGNOSIS — G629 Polyneuropathy, unspecified: Secondary | ICD-10-CM | POA: Diagnosis not present

## 2023-01-20 LAB — CUP PACEART REMOTE DEVICE CHECK
Battery Remaining Longevity: 38 mo
Battery Remaining Percentage: 37 %
Battery Voltage: 2.95 V
Brady Statistic AP VP Percent: 54 %
Brady Statistic AP VS Percent: 1 %
Brady Statistic AS VP Percent: 41 %
Brady Statistic AS VS Percent: 1.3 %
Brady Statistic RA Percent Paced: 50 %
Brady Statistic RV Percent Paced: 95 %
Date Time Interrogation Session: 20240710022637
Implantable Lead Connection Status: 753985
Implantable Lead Connection Status: 753985
Implantable Lead Implant Date: 20180730
Implantable Lead Implant Date: 20180730
Implantable Lead Location: 753859
Implantable Lead Location: 753860
Implantable Lead Model: 5076
Implantable Lead Model: 5076
Implantable Pulse Generator Implant Date: 20180730
Lead Channel Impedance Value: 400 Ohm
Lead Channel Impedance Value: 550 Ohm
Lead Channel Pacing Threshold Amplitude: 0.5 V
Lead Channel Pacing Threshold Amplitude: 0.75 V
Lead Channel Pacing Threshold Pulse Width: 0.5 ms
Lead Channel Pacing Threshold Pulse Width: 0.5 ms
Lead Channel Sensing Intrinsic Amplitude: 2.8 mV
Lead Channel Sensing Intrinsic Amplitude: 9.7 mV
Lead Channel Setting Pacing Amplitude: 2 V
Lead Channel Setting Pacing Amplitude: 2.5 V
Lead Channel Setting Pacing Pulse Width: 0.5 ms
Lead Channel Setting Sensing Sensitivity: 4 mV
Pulse Gen Model: 2272
Pulse Gen Serial Number: 8930553

## 2023-01-20 NOTE — Telephone Encounter (Signed)
Transition Care Management Unsuccessful Follow-up Telephone Call  Date of discharge and from where:  01/14/2023 [facility]  Attempts:  1st Attempt  Reason for unsuccessful TCM follow-up call:  No answer/busy  Hunter [name] [place]  [facility] Population Health Community Resource Care Guide   [email or web address]  ?? [phone]   Website: [email or web address]  [email or web address]      

## 2023-01-21 ENCOUNTER — Telehealth: Payer: Self-pay

## 2023-01-21 NOTE — Telephone Encounter (Signed)
Transition Care Management Follow-up Telephone Call Date of discharge and from where: 01/14/2023 [facility] How have you been since you were released from the hospital? Patient is still having difficulty with his memory. Any questions or concerns? No  Items Reviewed: Did the pt receive and understand the discharge instructions provided? Yes  Medications obtained and verified? Yes  Other? No  Any new allergies since your discharge? No  Dietary orders reviewed? Yes Do you have support at home? Yes   Follow up appointments reviewed:  PCP Hospital f/u appt confirmed? Yes  Scheduled to see [name]. [name], MD on 02/08/2023 @ [facility]. Specialist Hospital f/u appt confirmed? Yes  Scheduled to see Alan Mulder, MD on 01/29/2023 @ [facility]. Are transportation arrangements needed? No  If their condition worsens, is the pt aware to call PCP or go to the Emergency Dept.? Yes Was the patient provided with contact information for the PCP's office or ED? Yes Was to pt encouraged to call back with questions or concerns? Yes  Chani Ghanem [facility]  [facility] Population Health Community Resource Care Guide   [email or web address]  ?? [phone]   Website: [email or web address]  [email or web address]

## 2023-01-23 ENCOUNTER — Emergency Department (HOSPITAL_COMMUNITY): Payer: Medicare Other

## 2023-01-23 ENCOUNTER — Other Ambulatory Visit: Payer: Self-pay

## 2023-01-23 ENCOUNTER — Encounter (HOSPITAL_COMMUNITY): Payer: Self-pay

## 2023-01-23 ENCOUNTER — Inpatient Hospital Stay (HOSPITAL_COMMUNITY)
Admission: EM | Admit: 2023-01-23 | Discharge: 2023-01-25 | DRG: 603 | Disposition: A | Discharge status: Home / Self Care | Payer: Medicare Other | Attending: Internal Medicine | Admitting: Internal Medicine

## 2023-01-23 DIAGNOSIS — Z79890 Hormone replacement therapy (postmenopausal): Secondary | ICD-10-CM

## 2023-01-23 DIAGNOSIS — R911 Solitary pulmonary nodule: Secondary | ICD-10-CM | POA: Diagnosis present

## 2023-01-23 DIAGNOSIS — L03115 Cellulitis of right lower limb: Principal | ICD-10-CM | POA: Diagnosis present

## 2023-01-23 DIAGNOSIS — R7989 Other specified abnormal findings of blood chemistry: Secondary | ICD-10-CM | POA: Insufficient documentation

## 2023-01-23 DIAGNOSIS — G3184 Mild cognitive impairment, so stated: Secondary | ICD-10-CM | POA: Diagnosis present

## 2023-01-23 DIAGNOSIS — E782 Mixed hyperlipidemia: Secondary | ICD-10-CM | POA: Diagnosis present

## 2023-01-23 DIAGNOSIS — I509 Heart failure, unspecified: Secondary | ICD-10-CM | POA: Diagnosis not present

## 2023-01-23 DIAGNOSIS — Z9079 Acquired absence of other genital organ(s): Secondary | ICD-10-CM | POA: Diagnosis not present

## 2023-01-23 DIAGNOSIS — I700 Atherosclerosis of aorta: Secondary | ICD-10-CM | POA: Diagnosis not present

## 2023-01-23 DIAGNOSIS — Z8042 Family history of malignant neoplasm of prostate: Secondary | ICD-10-CM

## 2023-01-23 DIAGNOSIS — Z801 Family history of malignant neoplasm of trachea, bronchus and lung: Secondary | ICD-10-CM | POA: Diagnosis not present

## 2023-01-23 DIAGNOSIS — J984 Other disorders of lung: Secondary | ICD-10-CM | POA: Diagnosis not present

## 2023-01-23 DIAGNOSIS — D696 Thrombocytopenia, unspecified: Secondary | ICD-10-CM | POA: Diagnosis not present

## 2023-01-23 DIAGNOSIS — G629 Polyneuropathy, unspecified: Secondary | ICD-10-CM | POA: Diagnosis present

## 2023-01-23 DIAGNOSIS — H9193 Unspecified hearing loss, bilateral: Secondary | ICD-10-CM | POA: Diagnosis not present

## 2023-01-23 DIAGNOSIS — Z7982 Long term (current) use of aspirin: Secondary | ICD-10-CM | POA: Diagnosis not present

## 2023-01-23 DIAGNOSIS — I442 Atrioventricular block, complete: Secondary | ICD-10-CM | POA: Diagnosis not present

## 2023-01-23 DIAGNOSIS — Z85828 Personal history of other malignant neoplasm of skin: Secondary | ICD-10-CM | POA: Diagnosis not present

## 2023-01-23 DIAGNOSIS — Z8549 Personal history of malignant neoplasm of other male genital organs: Secondary | ICD-10-CM | POA: Diagnosis not present

## 2023-01-23 DIAGNOSIS — M7989 Other specified soft tissue disorders: Secondary | ICD-10-CM | POA: Diagnosis present

## 2023-01-23 DIAGNOSIS — I130 Hypertensive heart and chronic kidney disease with heart failure and stage 1 through stage 4 chronic kidney disease, or unspecified chronic kidney disease: Secondary | ICD-10-CM | POA: Diagnosis present

## 2023-01-23 DIAGNOSIS — R918 Other nonspecific abnormal finding of lung field: Secondary | ICD-10-CM | POA: Diagnosis not present

## 2023-01-23 DIAGNOSIS — R0602 Shortness of breath: Secondary | ICD-10-CM | POA: Diagnosis not present

## 2023-01-23 DIAGNOSIS — Z6832 Body mass index (BMI) 32.0-32.9, adult: Secondary | ICD-10-CM

## 2023-01-23 DIAGNOSIS — G8929 Other chronic pain: Secondary | ICD-10-CM | POA: Diagnosis present

## 2023-01-23 DIAGNOSIS — Z45010 Encounter for checking and testing of cardiac pacemaker pulse generator [battery]: Secondary | ICD-10-CM | POA: Diagnosis not present

## 2023-01-23 DIAGNOSIS — I5032 Chronic diastolic (congestive) heart failure: Secondary | ICD-10-CM | POA: Diagnosis not present

## 2023-01-23 DIAGNOSIS — R600 Localized edema: Secondary | ICD-10-CM | POA: Diagnosis not present

## 2023-01-23 DIAGNOSIS — E669 Obesity, unspecified: Secondary | ICD-10-CM | POA: Diagnosis present

## 2023-01-23 DIAGNOSIS — E039 Hypothyroidism, unspecified: Secondary | ICD-10-CM | POA: Diagnosis present

## 2023-01-23 DIAGNOSIS — I739 Peripheral vascular disease, unspecified: Secondary | ICD-10-CM | POA: Diagnosis not present

## 2023-01-23 DIAGNOSIS — Z8249 Family history of ischemic heart disease and other diseases of the circulatory system: Secondary | ICD-10-CM | POA: Diagnosis not present

## 2023-01-23 DIAGNOSIS — K219 Gastro-esophageal reflux disease without esophagitis: Secondary | ICD-10-CM | POA: Diagnosis present

## 2023-01-23 DIAGNOSIS — M79661 Pain in right lower leg: Secondary | ICD-10-CM | POA: Diagnosis not present

## 2023-01-23 DIAGNOSIS — Z79899 Other long term (current) drug therapy: Secondary | ICD-10-CM

## 2023-01-23 DIAGNOSIS — I110 Hypertensive heart disease with heart failure: Secondary | ICD-10-CM | POA: Diagnosis not present

## 2023-01-23 DIAGNOSIS — M545 Low back pain, unspecified: Secondary | ICD-10-CM | POA: Diagnosis present

## 2023-01-23 DIAGNOSIS — N1831 Chronic kidney disease, stage 3a: Secondary | ICD-10-CM | POA: Diagnosis not present

## 2023-01-23 DIAGNOSIS — M799 Soft tissue disorder, unspecified: Secondary | ICD-10-CM | POA: Diagnosis not present

## 2023-01-23 DIAGNOSIS — I10 Essential (primary) hypertension: Secondary | ICD-10-CM

## 2023-01-23 LAB — COMPREHENSIVE METABOLIC PANEL
ALT: 19 U/L (ref 0–44)
AST: 23 U/L (ref 15–41)
Albumin: 3.9 g/dL (ref 3.5–5.0)
Alkaline Phosphatase: 80 U/L (ref 38–126)
Anion gap: 8 (ref 5–15)
BUN: 31 mg/dL — ABNORMAL HIGH (ref 8–23)
CO2: 25 mmol/L (ref 22–32)
Calcium: 9.1 mg/dL (ref 8.9–10.3)
Chloride: 104 mmol/L (ref 98–111)
Creatinine, Ser: 1.52 mg/dL — ABNORMAL HIGH (ref 0.61–1.24)
GFR, Estimated: 44 mL/min — ABNORMAL LOW (ref >60)
Glucose, Bld: 129 mg/dL — ABNORMAL HIGH (ref 70–99)
Potassium: 4.2 mmol/L (ref 3.5–5.1)
Sodium: 137 mmol/L (ref 135–145)
Total Bilirubin: 0.6 mg/dL (ref 0.3–1.2)
Total Protein: 6.3 g/dL — ABNORMAL LOW (ref 6.5–8.1)

## 2023-01-23 LAB — CBC WITH DIFFERENTIAL/PLATELET
Abs Immature Granulocytes: 0.02 10*3/uL (ref 0.00–0.07)
Basophils Absolute: 0 10*3/uL (ref 0.0–0.1)
Basophils Relative: 0 %
Eosinophils Absolute: 0 10*3/uL (ref 0.0–0.5)
Eosinophils Relative: 1 %
HCT: 39.8 % (ref 39.0–52.0)
Hemoglobin: 13.2 g/dL (ref 13.0–17.0)
Immature Granulocytes: 0 %
Lymphocytes Relative: 27 %
Lymphs Abs: 1.6 10*3/uL (ref 0.7–4.0)
MCH: 26.7 pg (ref 26.0–34.0)
MCHC: 33.2 g/dL (ref 30.0–36.0)
MCV: 80.6 fL (ref 80.0–100.0)
Monocytes Absolute: 0.5 10*3/uL (ref 0.1–1.0)
Monocytes Relative: 8 %
Neutro Abs: 3.7 10*3/uL (ref 1.7–7.7)
Neutrophils Relative %: 64 %
Platelets: 90 10*3/uL — ABNORMAL LOW (ref 150–400)
RBC: 4.94 MIL/uL (ref 4.22–5.81)
RDW: 14 % (ref 11.5–15.5)
WBC: 5.8 10*3/uL (ref 4.0–10.5)
nRBC: 0 % (ref 0.0–0.2)

## 2023-01-23 LAB — TROPONIN I (HIGH SENSITIVITY)
Troponin I (High Sensitivity): 8 ng/L (ref <18)
Troponin I (High Sensitivity): 9 ng/L (ref <18)

## 2023-01-23 LAB — BRAIN NATRIURETIC PEPTIDE: B Natriuretic Peptide: 124 pg/mL — ABNORMAL HIGH (ref 0.0–100.0)

## 2023-01-23 MED ORDER — SODIUM CHLORIDE 0.9 % IV SOLN
2.0000 g | Freq: Once | INTRAVENOUS | Status: AC
  Administered 2023-01-23: 2 g via INTRAVENOUS
  Filled 2023-01-23: qty 20

## 2023-01-23 MED ORDER — IOHEXOL 300 MG/ML  SOLN
75.0000 mL | Freq: Once | INTRAMUSCULAR | Status: AC | PRN
  Administered 2023-01-23: 60 mL via INTRAVENOUS

## 2023-01-23 NOTE — H&P (Signed)
History and Physical    Patient: Hunter [name]:[id number] DOB: Mar 01, 1934 DOA: 01/23/2023 DOS: the patient was seen and examined on 01/24/2023 PCP: Shelva Majestic, MD  Patient coming from: Home  Chief Complaint:  Chief Complaint  Patient presents with   Leg Swelling    Bilateral   HPI: Hunter [name] is a 89 y.o. male with medical history significant of CHF, hypertension, hyperlipidemia, hypothyroidism, CKD stage III, thrombocytopenia, LBBB, pacemaker implant (2018) who presents to the emergency department due to concern for cellulitis to the right leg.  Patient was seen in the ED on 7/3 and was treated with Keflex with assumption the patient to continue to take Keflex at home, however, patient was not taking any antibiotics at home, he returned to the ED today with worsening redness to his right lower extremity which has been worsening within the last few days.  He denies fever, chills, nausea, vomiting, abdominal pain.  ED Course:  In the emergency department, BP was 146/60 and other vital signs were within normal range.  Workup in the ED showed normal CBC except for platelets of 90.  BMP was normal except for blood glucose of 129, BUN/creatinine 31/1.52 (creatinine appears to be at baseline).Marland Kitchen  BNP 124, troponin x 2 was negative. Chest x-ray showed cardiomegaly.  Central pulmonary vessels are more prominent suggesting CHF.  There are patchy densities in left lower lung field suggesting atelectasis/pneumonia. CT chest with contrast showed left groundglass pulmonary nodule measuring 12 mm Right tibia/fibula x-ray showed diffuse edema in the subcutaneous plane in the right lower leg suggesting contusion or cellulitis.  No fracture or dislocation is seen in the right tibia and fibula. Patient was treated with IV ceftriaxone.  Hospitalist was asked to admit patient for further evaluation and management.   Review of Systems: Review of systems as noted in the HPI. All other systems  reviewed and are negative.   Past Medical History:  Diagnosis Date   Allergic rhinitis 02/02/2007   Anemia    Arthritis    "knees" (02/08/2017)   Chronic lower back pain    CKD (chronic kidney disease), stage III (HCC) 05/27/2011   Complete heart block 02/08/2017   Eczema    Essential hypertension 02/02/2007   Lasix 60 mg, losartan 50mg ,  Amlodipine 5mg --> 2.5 mg.  Usually have to repeat BP measurements  In the past-Maxzide 37.5-25mg .   GERD (gastroesophageal reflux disease) occasional   Hearing loss of both ears    Heart failure with preserved ejection fraction (HFpEF) 12/06/2013   History of skin cancer 03/26/2014   nose    Hx of echocardiogram    Echo (07/2013): Mild LVH, EF 55-60%, grade 1 diastolic dysfunction, mild LAE, PASP 23   Hyperlipidemia    Hypertension    Hypothyroidism    LBBB (left bundle branch block)    Left patella fracture 2018   "no OR" (02/08/2017)   Major depression in partial remission 02/02/2007   Amitriptyline 25mg  for sleep (stop when runs out of #10 04/2018(, zoloft 100-->150mg --> 200mg    Mild neurocognitive disorder 06/21/2019   Neuropathy (HCC) 11/02/2011   Nocturia    Peripheral vascular disease (HCC) LOWER EXTREMITIES   Spinal stenosis in cervical region 03/21/2010   Squamous cell skin cancer, penis: glans (HCC) 05/2010   Initial excision 11/11; recurrence, excision and laser Rx 9/13   Thrombocytopenia (HCC) 05/23/2012   Urinary frequency 09/05/2009   Possible BPH- see Artist Pais notes    Vitamin D deficiency 08/15/2008   Past  Surgical History:  Procedure Laterality Date   CIRCUMCISION/ LASER DISSECTION PENILE GLANS CANCER  06-02-2010   CYSTOSCOPY WITH URETHRAL DILATATION  03/28/2012   Procedure: CYSTOSCOPY WITH URETHRAL DILATATION;  Surgeon: Kathi Ludwig, MD;  Location: [facility];  Service: Urology;  Laterality: N/A;  excision biopsy extensive meatal penile carcinoma meatoplasty   EXCISION RIGHT WRIST BENIGN TUMOR  2002 (APPROX)    hip surgery     INCISION AND DRAINAGE DEEP NECK ABSCESS     INGUINAL HERNIA REPAIR  1970's   KNEE ARTHROSCOPY Right 2017   LUMBAR LAMINECTOMY/DECOMPRESSION MICRODISCECTOMY N/A 05/24/2020   Procedure: OPEN LAMINECTOMY LUMBAR THREE-LUMBAR FOUR;  Surgeon: Bethann Goo, DO;  Location: [facility];  Service: Neurosurgery;  Laterality: N/A;   PACEMAKER IMPLANT N/A 02/08/2017   Procedure: Pacemaker Implant;  Surgeon: Duke Salvia, MD;  Location: [facility] INVASIVE CV LAB;  Service: Cardiovascular;  Laterality: N/A;   PACEMAKER IMPLANT  02/08/2017   SJM Assurity MRI dual chamber PPM implanted by Dr Graciela Husbands for complete heart block   PENILE BX  05-09-2010   TONSILLECTOMY     TRANSURETHRAL RESECTION OF PROSTATE N/A 05/10/2015   Procedure: CYSTOSCOPY, URETHRAL MEATAL DILATION, TRANSURETHRAL RESECTION OF THE PROSTATE (TURP);  Surgeon: Jethro Bolus, MD;  Location: [facility];  Service: Urology;  Laterality: N/A;    Social History:  reports that he has never smoked. He has never been exposed to tobacco smoke. He has never used smokeless tobacco. He reports current alcohol use. He reports that he does not use drugs.   No Known Allergies  Family History  Problem Relation Age of Onset   Lung cancer Mother    Arthritis Other        family hx   Hyperlipidemia Other        family hx   Hypertension Other        family hx   Prostate cancer Other        family hx     Prior to Admission medications   Medication Sig Start Date End Date Taking? Authorizing Provider  amLODipine (NORVASC) 2.5 MG tablet TAKE 1 TABLET BY MOUTH DAILY 11/25/22   Shelva Majestic, MD  aspirin 81 MG chewable tablet Chew 81 mg by mouth daily.     [provider]  atorvastatin (LIPITOR) 20 MG tablet TAKE 1 TABLET BY MOUTH DAILY 06/22/22   Shelva Majestic, MD  Cyanocobalamin (B-12 PO) Take 1 tablet by mouth daily.    [provider]  ferrous sulfate 325 (65 FE) MG tablet Take 325 mg by mouth daily with breakfast.     [provider]  furosemide (LASIX) 20 MG tablet TAKE 3 TABLETS BY MOUTH DAILY 11/25/22   Shelva Majestic, MD  levothyroxine (SYNTHROID) 75 MCG tablet TAKE 1 TABLET BY MOUTH EVERY MORNING 01/13/23   Shelva Majestic, MD  losartan (COZAAR) 50 MG tablet TAKE 1 TABLET BY MOUTH DAILY 01/13/23   Shelva Majestic, MD  pantoprazole (PROTONIX) 40 MG tablet Take 1 tablet (40 mg total) by mouth daily. Patient not taking: Reported on 11/26/2022 06/18/21 09/01/88  Vassie Loll, MD  sertraline (ZOLOFT) 100 MG tablet TAKE 2 TABLETS BY MOUTH DAILY 11/25/22   Shelva Majestic, MD  Vitamin D, Ergocalciferol, (DRISDOL) 1.25 MG (50000 UNIT) CAPS capsule TAKE 1 CAPSULE BY MOUTH EVERY 7  DAYS 12/10/22   Shelva Majestic, MD    Physical Exam: BP 125/60 (BP Location: Left Arm)   Pulse 63  Temp 98 F (36.7 C)   Resp 20   Ht 5\' 11"  (1.803 m)   Wt 104.3 kg   SpO2 100%   BMI 32.08 kg/m   General: 89 y.o. year-old male well developed well nourished in no acute distress.  Alert and oriented x3. HEENT: NCAT, EOMI Neck: Supple, trachea medial Cardiovascular: Regular rate and rhythm with no rubs or gallops.  No thyromegaly or JVD noted.  Bilateral lower extremity edema. 2/4 pulses in all 4 extremities. Respiratory: Clear to auscultation with no wheezes or rales. Good inspiratory effort. Abdomen: Soft, nontender nondistended with normal bowel sounds x4 quadrants. Muskuloskeletal: No cyanosis, clubbing noted bilaterally Neuro: CN II-XII intact, strength 5/5 x 4, sensation, reflexes intact Skin: No ulcerative lesions noted or rashes Psychiatry: Judgement and insight appear normal. Mood is appropriate for condition and setting          Labs on Admission:  Basic Metabolic Panel: Recent Labs  Lab 01/23/23 1437  NA 137  K 4.2  CL 104  CO2 25  GLUCOSE 129*  BUN 31*  CREATININE 1.52*  CALCIUM 9.1   Liver Function Tests: Recent Labs  Lab 01/23/23 1437  AST 23  ALT 19  ALKPHOS 80  BILITOT 0.6   PROT 6.3*  ALBUMIN 3.9   No results for input(s): "LIPASE", "AMYLASE" in the last 168 hours. No results for input(s): "AMMONIA" in the last 168 hours. CBC: Recent Labs  Lab 01/23/23 1437  WBC 5.8  NEUTROABS 3.7  HGB 13.2  HCT 39.8  MCV 80.6  PLT 90*   Cardiac Enzymes: No results for input(s): "CKTOTAL", "CKMB", "CKMBINDEX", "TROPONINI" in the last 168 hours.  BNP (last 3 results) Recent Labs    01/23/23 1437  BNP 124.0*    ProBNP (last 3 results) No results for input(s): "PROBNP" in the last 8760 hours.  CBG: No results for input(s): "GLUCAP" in the last 168 hours.  Radiological Exams on Admission: CT Chest W Contrast  Result Date: 01/23/2023 CLINICAL DATA:  Patchy opacities on recent chest x-ray EXAM: CT CHEST WITH CONTRAST TECHNIQUE: Multidetector CT imaging of the chest was performed during intravenous contrast administration. RADIATION DOSE REDUCTION: This exam was performed according to the departmental dose-optimization program which includes automated exposure control, adjustment of the mA and/or kV according to patient size and/or use of iterative reconstruction technique. CONTRAST:  60mL OMNIPAQUE IOHEXOL 300 MG/ML  SOLN COMPARISON:  Plain film from earlier in the same day. FINDINGS: Cardiovascular: Atherosclerotic calcifications of the thoracic aorta are noted without aneurysmal dilatation or dissection. Pacing device is noted. Pulmonary artery shows no large central pulmonary embolus although timing was not performed for embolus evaluation. No pericardial effusion is seen. Diffuse coronary calcifications are noted. Mediastinum/Nodes: Thoracic inlet is within normal limits. No hilar or mediastinal adenopathy is noted. The esophagus as visualized is within normal limits. Lungs/Pleura: Lungs are well aerated bilaterally. No focal confluent infiltrate or effusion is seen. Mild scarring is noted in the bases bilaterally. Ground-glass opacity is noted in the posterior aspect  of the left lower lobe measuring approximately 12 mm in mean diameter. Upper Abdomen: Visualized upper abdomen is within normal limits. Musculoskeletal: Degenerative changes of the thoracic spine are noted. No compression deformity is seen. IMPRESSION: Left ground-glass pulmonary nodule measuring 12 mm. Per Fleischner Society Guidelines, recommend a non-contrast Chest CT at 6 months to confirm persistence, then additional non-contrast Chest CTs every 2 years until 5 years. If nodule grows or develops solid component(s), consider resection. These guidelines do  not apply to immunocompromised patients and patients with cancer. Follow up in patients with significant comorbidities as clinically warranted. For lung cancer screening, adhere to Lung-RADS guidelines. Reference: Radiology. 2017; 284(1):228-43. No definitive infiltrate is seen. Aortic Atherosclerosis (ICD10-I70.0). Electronically Signed   By: Alcide Clever M.D.   On: 01/23/2023 21:31   DG Chest Port 1 View  Result Date: 01/23/2023 CLINICAL DATA:  Shortness of breath EXAM: PORTABLE CHEST 1 VIEW COMPARISON:  Previous studies including the examination of 06/17/2021 FINDINGS: Transverse diameter of heart is increased. Central pulmonary vessels are more prominent. Increased interstitial markings are seen in the parahilar regions and lower lung fields. Small patchy densities are seen in left lower lung field. Lateral CP angles are clear. There is no pneumothorax. Pacemaker battery is seen in the left infraclavicular region. IMPRESSION: Cardiomegaly. Central pulmonary vessels are more prominent suggesting CHF. There are patchy densities in left lower lung fields suggesting atelectasis/pneumonia. Electronically Signed   By: Ernie Avena M.D.   On: 01/23/2023 17:34   DG Tibia/Fibula Right  Result Date: 01/23/2023 CLINICAL DATA:  Pain and swelling EXAM: RIGHT TIBIA AND FIBULA - 2 VIEW COMPARISON:  None Available. FINDINGS: There is diffuse edema in  subcutaneous plane along with skin thickening in right lower leg. No fracture or dislocation is seen. Bony spurs are seen in right knee. There is no significant effusion. IMPRESSION: There is diffuse edema in subcutaneous plane in right lower leg suggesting contusion or cellulitis. No fracture or dislocation is seen in right tibia and fibula. Degenerative changes are noted with bony spurs in right knee. Electronically Signed   By: Ernie Avena M.D.   On: 01/23/2023 17:32    EKG: I independently viewed the EKG done and my findings are as followed: Normal sinus rhythm at a rate of 74 bpm with multiple PVCs and QTc 546 ms  Assessment/Plan Present on Admission:  Cellulitis of right lower extremity  Essential hypertension  Mixed hyperlipidemia  Thrombocytopenia (HCC)  Acquired hypothyroidism  Obesity (BMI 30-39.9)  Principal Problem:   Cellulitis of right lower extremity Active Problems:   Acquired hypothyroidism   Mixed hyperlipidemia   Essential hypertension   Thrombocytopenia (HCC)   Obesity (BMI 30-39.9)   Pulmonary nodule   Elevated brain natriuretic peptide (BNP) level  Cellulitis of right lower extremity Continue IV ceftriaxone Continue Tylenol as needed  Elevated BNP, rule out acute on chronic diastolic CHF BNP 161, this may be due to patient's current status Continue total input/output, daily weights and fluid restriction Continue heart healthy diet  Echocardiogram done in May 2024 showed LVEF of 50 to 55%.  No RWMA.  G1 DD Echocardiogram will be done in the morning in the morning   Pulmonary nodule CT chest with contrast showed left groundglass pulmonary nodule measuring 12 mm Chest CT 6 months to confirm persistence,  then additional non-contrast Chest CTs every 2 years until 5 years. If nodule grows or develops solid component(s), consider resection.as recommended by radiologist.  Thrombocytopenia (chronic) Platelets 90, continue to monitor platelet  levels  Essential hypertension Continue losartan  Mixed hyperlipidemia Continue Lipitor  Acquired hypothyroidism Continue Synthroid  Obesity (BMI 32.08) Diet and lifestyle modification  DVT prophylaxis: Lovenox  Advance Care Planning: Full code  Consults: None  Family Communication: None at bedside  Severity of Illness: The appropriate patient status for this patient is INPATIENT. Inpatient status is judged to be reasonable and necessary in order to provide the required intensity of service to ensure the patient's safety. The  patient's presenting symptoms, physical exam findings, and initial radiographic and laboratory data in the context of their chronic comorbidities is felt to place them at high risk for further clinical deterioration. Furthermore, it is not anticipated that the patient will be medically stable for discharge from the hospital within 2 midnights of admission.   * I certify that at the point of admission it is my clinical judgment that the patient will require inpatient hospital care spanning beyond 2 midnights from the point of admission due to high intensity of service, high risk for further deterioration and high frequency of surveillance required.*  Author: Frankey Shown, DO 01/24/2023 5:41 AM  For on call review [email or web address].

## 2023-01-23 NOTE — H&P (Incomplete)
History and Physical    Patient: Hunter [name]:[id number] DOB: [birth year] DOA: 01/23/2023 DOS: the patient was seen and examined on 01/23/2023 PCP: Shelva Majestic, MD  Patient coming from: {Point_of_Origin:[address]}  Chief Complaint:  Chief Complaint  Patient presents with  . Leg Swelling    Bilateral   HPI: Hunter [name] is a 89 y.o. male with medical history significant of ***  Review of Systems: {ROS_Text:26778} Past Medical History:  Diagnosis Date  . Allergic rhinitis 02/02/2007  . Anemia   . Arthritis    "knees" (02/08/2017)  . Chronic lower back pain   . CKD (chronic kidney disease), stage III (HCC) 05/27/2011  . Complete heart block 02/08/2017  . Eczema   . Essential hypertension 02/02/2007   Lasix 60 mg, losartan 50mg ,  Amlodipine 5mg --> 2.5 mg.  Usually have to repeat BP measurements  In the past-Maxzide 37.5-25mg .  . GERD (gastroesophageal reflux disease) occasional  . Hearing loss of both ears   . Heart failure with preserved ejection fraction (HFpEF) 12/06/2013  . History of skin cancer 03/26/2014   nose   . Hx of echocardiogram    Echo (07/2013): Mild LVH, EF 55-60%, grade 1 diastolic dysfunction, mild LAE, PASP 23  . Hyperlipidemia   . Hypertension   . Hypothyroidism   . LBBB (left bundle branch block)   . Left patella fracture 2018   "no OR" (02/08/2017)  . Major depression in partial remission 02/02/2007   Amitriptyline 25mg  for sleep (stop when runs out of #10 04/2018(, zoloft 100-->150mg --> 200mg   . Mild neurocognitive disorder 06/21/2019  . Neuropathy (HCC) 11/02/2011  . Nocturia   . Peripheral vascular disease (HCC) LOWER EXTREMITIES  . Spinal stenosis in cervical region 03/21/2010  . Squamous cell skin cancer, penis: glans (HCC) 05/2010   Initial excision 11/11; recurrence, excision and laser Rx 9/13  . Thrombocytopenia (HCC) 05/23/2012  . Urinary frequency 09/05/2009   Possible BPH- see Artist Pais notes   . Vitamin D deficiency 08/15/2008   Past Surgical  History:  Procedure Laterality Date  . CIRCUMCISION/ LASER DISSECTION PENILE GLANS CANCER  06-02-2010  . CYSTOSCOPY WITH URETHRAL DILATATION  03/28/2012   Procedure: CYSTOSCOPY WITH URETHRAL DILATATION;  Surgeon: Kathi Ludwig, MD;  Location: [facility];  Service: Urology;  Laterality: N/A;  excision biopsy extensive meatal penile carcinoma meatoplasty  . EXCISION RIGHT WRIST BENIGN TUMOR  2002 (APPROX)  . hip surgery    . INCISION AND DRAINAGE DEEP NECK ABSCESS    . INGUINAL HERNIA REPAIR  1970's  . KNEE ARTHROSCOPY Right 2017  . LUMBAR LAMINECTOMY/DECOMPRESSION MICRODISCECTOMY N/A 05/24/2020   Procedure: OPEN LAMINECTOMY LUMBAR THREE-LUMBAR FOUR;  Surgeon: Bethann Goo, DO;  Location: [facility];  Service: Neurosurgery;  Laterality: N/A;  . PACEMAKER IMPLANT N/A 02/08/2017   Procedure: Pacemaker Implant;  Surgeon: Duke Salvia, MD;  Location: [facility] INVASIVE CV LAB;  Service: Cardiovascular;  Laterality: N/A;  . PACEMAKER IMPLANT  02/08/2017   SJM Assurity MRI dual chamber PPM implanted by Dr Graciela Husbands for complete heart block  . PENILE BX  05-09-2010  . TONSILLECTOMY    . TRANSURETHRAL RESECTION OF PROSTATE N/A 05/10/2015   Procedure: CYSTOSCOPY, URETHRAL MEATAL DILATION, TRANSURETHRAL RESECTION OF THE PROSTATE (TURP);  Surgeon: Jethro Bolus, MD;  Location: [facility];  Service: Urology;  Laterality: N/A;   Social History:  reports that he has never smoked. He has never been exposed to tobacco smoke. He has never used smokeless tobacco. He reports current alcohol  use. He reports that he does not use drugs.  No Known Allergies  Family History  Problem Relation Age of Onset  . Lung cancer Mother   . Arthritis Other        family hx  . Hyperlipidemia Other        family hx  . Hypertension Other        family hx  . Prostate cancer Other        family hx    Prior to Admission medications   Medication Sig Start Date End Date Taking? Authorizing Provider  amLODipine  (NORVASC) 2.5 MG tablet TAKE 1 TABLET BY MOUTH DAILY 11/25/22   Shelva Majestic, MD  aspirin 81 MG chewable tablet Chew 81 mg by mouth daily.     [provider]  atorvastatin (LIPITOR) 20 MG tablet TAKE 1 TABLET BY MOUTH DAILY 06/22/22   Shelva Majestic, MD  Cyanocobalamin (B-12 PO) Take 1 tablet by mouth daily.    [provider]  ferrous sulfate 325 (65 FE) MG tablet Take 325 mg by mouth daily with breakfast.    [provider]  furosemide (LASIX) 20 MG tablet TAKE 3 TABLETS BY MOUTH DAILY 11/25/22   Shelva Majestic, MD  levothyroxine (SYNTHROID) 75 MCG tablet TAKE 1 TABLET BY MOUTH EVERY MORNING 01/13/23   Shelva Majestic, MD  losartan (COZAAR) 50 MG tablet TAKE 1 TABLET BY MOUTH DAILY 01/13/23   Shelva Majestic, MD  pantoprazole (PROTONIX) 40 MG tablet Take 1 tablet (40 mg total) by mouth daily. Patient not taking: Reported on 11/26/2022 06/18/21 09/01/88  Vassie Loll, MD  sertraline (ZOLOFT) 100 MG tablet TAKE 2 TABLETS BY MOUTH DAILY 11/25/22   Shelva Majestic, MD  Vitamin D, Ergocalciferol, (DRISDOL) 1.25 MG (50000 UNIT) CAPS capsule TAKE 1 CAPSULE BY MOUTH EVERY 7  DAYS 12/10/22   Shelva Majestic, MD    Physical Exam: Vitals:   01/23/23 1334 01/23/23 1742 01/23/23 1745 01/23/23 1900  BP: (!) 145/43 (!) 146/60 (!) 146/60 (!) 143/65  Pulse: 60 63  63  Resp: 16 14  (!) 21  Temp: 97.6 F (36.4 C) 98.2 F (36.8 C)    TempSrc:  Oral    SpO2: 96% 97%  96%  Weight:      Height:       *** Data Reviewed: {Tip this will not be part of the note when signed- Document your independent interpretation of telemetry tracing, EKG, lab, Radiology test or any other diagnostic tests. Add any new diagnostic test ordered today. (Optional):26781} {Results:26384}  Assessment and Plan: No notes have been filed under this hospital service. Service: Hospitalist     Advance Care Planning:   Code Status: Prior ***  Consults: ***  Family Communication:  ***  Severity of Illness: {Observation/Inpatient:21159}  Author: Frankey Shown, DO 01/23/2023 11:13 PM  For on call review [email or web address].

## 2023-01-23 NOTE — ED Provider Triage Note (Signed)
Emergency Medicine Provider Triage Evaluation Note  Hunter [name] , a 89 y.o. male  was evaluated in triage.  Pt complains of leg swelling/redness.  Patient reports history of lower extremity swelling/cellulitis with worsening of symptoms over the past several days.  Denies any fever, chills, night sweats, abdominal pain, nausea, vomiting, urinary symptoms, change in bowel habits.  Patient states that he has noticed more lower extremity swelling, shortness of breath over the past week or so.  Reports dyspnea with exertion.  Denies chest pain.  Review of Systems  Positive: See above Negative:   Physical Exam  BP (!) 145/43 (BP Location: Right Arm)   Pulse 60   Temp 97.6 F (36.4 C)   Resp 16   Ht 5\' 11"  (1.803 m)   Wt 104.3 kg   SpO2 96%   BMI 32.08 kg/m  Gen:   Awake, no distress   Resp:  Normal effort  MSK:   Moves extremities without difficulty  Other:  Lower extremity swelling.  Erythematous right lower leg.  Medical Decision Making  Medically screening exam initiated at 3:03 PM.  Appropriate orders placed.  [name] was informed that the remainder of the evaluation will be completed by another provider, this initial triage assessment does not replace that evaluation, and the importance of remaining in the ED until their evaluation is complete.     Peter Garter, [place] 01/23/23 1510

## 2023-01-23 NOTE — ED Provider Notes (Signed)
EMERGENCY DEPARTMENT AT [facility] Provider Note   CSN: [id number] Arrival date & time: 01/23/23  1326     History  Chief Complaint  Patient presents with   Leg Swelling    Bilateral    Hunter [name] is a 89 y.o. male.  Patient was seen on July 3 and review of that note shows that there was concerns for cellulitis to the right leg at that time patient was treated with Keflex in the ED and the presumption is that he would have been discharged home with Keflex patient says he is not taking any Keflex at home.  He is here for more redness to his right lower extremity has been getting worse over the last several days.  Patient's known to have a history of congestive heart failure is on Lasix.  Patient is not on blood thinners.  He is also known to have a history of hypertension hyperlipidemia hypothyroidism left bundle branch block complete heart block in 2018 heart failure preserved ejection fraction in 2015.  Mild neurocognitive disorder diagnosed in 2020 chronic kidney disease stage III history of neuropathy history of thrombocytopenia.  Pacemaker implant in 2018.  Patient is never used tobacco products.       Home Medications Prior to Admission medications   Medication Sig Start Date End Date Taking? Authorizing Provider  amLODipine (NORVASC) 2.5 MG tablet TAKE 1 TABLET BY MOUTH DAILY 11/25/22   Shelva Majestic, MD  aspirin 81 MG chewable tablet Chew 81 mg by mouth daily.     [provider]  atorvastatin (LIPITOR) 20 MG tablet TAKE 1 TABLET BY MOUTH DAILY 06/22/22   Shelva Majestic, MD  Cyanocobalamin (B-12 PO) Take 1 tablet by mouth daily.    [provider]  ferrous sulfate 325 (65 FE) MG tablet Take 325 mg by mouth daily with breakfast.    [provider]  furosemide (LASIX) 20 MG tablet TAKE 3 TABLETS BY MOUTH DAILY 11/25/22   Shelva Majestic, MD  levothyroxine (SYNTHROID) 75 MCG tablet TAKE 1 TABLET BY MOUTH EVERY MORNING  01/13/23   Shelva Majestic, MD  losartan (COZAAR) 50 MG tablet TAKE 1 TABLET BY MOUTH DAILY 01/13/23   Shelva Majestic, MD  pantoprazole (PROTONIX) 40 MG tablet Take 1 tablet (40 mg total) by mouth daily. Patient not taking: Reported on 11/26/2022 06/18/21 09/01/88  Vassie Loll, MD  sertraline (ZOLOFT) 100 MG tablet TAKE 2 TABLETS BY MOUTH DAILY 11/25/22   Shelva Majestic, MD  Vitamin D, Ergocalciferol, (DRISDOL) 1.25 MG (50000 UNIT) CAPS capsule TAKE 1 CAPSULE BY MOUTH EVERY 7  DAYS 12/10/22   Shelva Majestic, MD      Allergies    Patient has no known allergies.    Review of Systems   Review of Systems  Constitutional:  Negative for chills and fever.  HENT:  Negative for ear pain and sore throat.   Eyes:  Negative for pain and visual disturbance.  Respiratory:  Negative for cough and shortness of breath.   Cardiovascular:  Positive for leg swelling. Negative for chest pain and palpitations.  Gastrointestinal:  Negative for abdominal pain and vomiting.  Genitourinary:  Negative for dysuria and hematuria.  Musculoskeletal:  Negative for arthralgias and back pain.  Skin:  Negative for color change and rash.  Neurological:  Negative for seizures and syncope.  All other systems reviewed and are negative.   Physical Exam Updated Vital Signs BP (!) 143/65  Pulse 63   Temp 98.2 F (36.8 C) (Oral)   Resp (!) 21   Ht 1.803 m (5\' 11" )   Wt 104.3 kg   SpO2 96%   BMI 32.08 kg/m  Physical Exam Vitals and nursing note reviewed.  Constitutional:      General: He is not in acute distress.    Appearance: Normal appearance. He is well-developed. He is not ill-appearing.  HENT:     Head: Normocephalic and atraumatic.  Eyes:     Conjunctiva/sclera: Conjunctivae normal.  Cardiovascular:     Rate and Rhythm: Normal rate and regular rhythm.     Heart sounds: No murmur heard. Pulmonary:     Effort: Pulmonary effort is normal. No respiratory distress.     Breath sounds: Normal breath  sounds. No wheezing, rhonchi or rales.  Abdominal:     Palpations: Abdomen is soft.     Tenderness: There is no abdominal tenderness.  Musculoskeletal:        General: No swelling.     Cervical back: Neck supple.     Right lower leg: Edema present.     Left lower leg: Edema present.     Comments: Marked swelling of the right lower extremity from the knee down with erythematous and increased warmth.  No open wounds.  Does involve the top of the right foot as well.  Good cap refill to both lower extremities.  Left lower extremity with a little bit of swelling but not a lot no erythema to that at all.  Skin:    General: Skin is warm and dry.     Capillary Refill: Capillary refill takes less than 2 seconds.  Neurological:     General: No focal deficit present.     Mental Status: He is alert and oriented to person, place, and time.  Psychiatric:        Mood and Affect: Mood normal.     ED Results / Procedures / Treatments   Labs (all labs ordered are listed, but only abnormal results are displayed) Labs Reviewed  COMPREHENSIVE METABOLIC PANEL - Abnormal; Notable for the following components:      Result Value   Glucose, Bld 129 (*)    BUN 31 (*)    Creatinine, Ser 1.52 (*)    Total Protein 6.3 (*)    GFR, Estimated 44 (*)    All other components within normal limits  CBC WITH DIFFERENTIAL/PLATELET - Abnormal; Notable for the following components:   Platelets 90 (*)    All other components within normal limits  BRAIN NATRIURETIC PEPTIDE - Abnormal; Notable for the following components:   B Natriuretic Peptide 124.0 (*)    All other components within normal limits  TROPONIN I (HIGH SENSITIVITY)  TROPONIN I (HIGH SENSITIVITY)    EKG EKG Interpretation Date/Time:  Saturday January 23 2023 17:47:02 EDT Ventricular Rate:  74 PR Interval:  70 QRS Duration:  196 QT Interval:  492 QTC Calculation: 546 R Axis:   -67  Text Interpretation: Sinus rhythm Multiple premature complexes,  vent & supraven Short PR interval Nonspecific IVCD with LAD Left ventricular hypertrophy No significant change since last tracing Confirmed by Vanetta Mulders [phone]) on 01/23/2023 5:54:10 PM  Radiology CT Chest W Contrast  Result Date: 01/23/2023 CLINICAL DATA:  Patchy opacities on recent chest x-ray EXAM: CT CHEST WITH CONTRAST TECHNIQUE: Multidetector CT imaging of the chest was performed during intravenous contrast administration. RADIATION DOSE REDUCTION: This exam was performed according to the departmental dose-optimization  program which includes automated exposure control, adjustment of the mA and/or kV according to patient size and/or use of iterative reconstruction technique. CONTRAST:  60mL OMNIPAQUE IOHEXOL 300 MG/ML  SOLN COMPARISON:  Plain film from earlier in the same day. FINDINGS: Cardiovascular: Atherosclerotic calcifications of the thoracic aorta are noted without aneurysmal dilatation or dissection. Pacing device is noted. Pulmonary artery shows no large central pulmonary embolus although timing was not performed for embolus evaluation. No pericardial effusion is seen. Diffuse coronary calcifications are noted. Mediastinum/Nodes: Thoracic inlet is within normal limits. No hilar or mediastinal adenopathy is noted. The esophagus as visualized is within normal limits. Lungs/Pleura: Lungs are well aerated bilaterally. No focal confluent infiltrate or effusion is seen. Mild scarring is noted in the bases bilaterally. Ground-glass opacity is noted in the posterior aspect of the left lower lobe measuring approximately 12 mm in mean diameter. Upper Abdomen: Visualized upper abdomen is within normal limits. Musculoskeletal: Degenerative changes of the thoracic spine are noted. No compression deformity is seen. IMPRESSION: Left ground-glass pulmonary nodule measuring 12 mm. Per Fleischner Society Guidelines, recommend a non-contrast Chest CT at 6 months to confirm persistence, then additional  non-contrast Chest CTs every 2 years until 5 years. If nodule grows or develops solid component(s), consider resection. These guidelines do not apply to immunocompromised patients and patients with cancer. Follow up in patients with significant comorbidities as clinically warranted. For lung cancer screening, adhere to Lung-RADS guidelines. Reference: Radiology. 2017; 284(1):228-43. No definitive infiltrate is seen. Aortic Atherosclerosis (ICD10-I70.0). Electronically Signed   By: Alcide Clever M.D.   On: 01/23/2023 21:31   DG Chest Port 1 View  Result Date: 01/23/2023 CLINICAL DATA:  Shortness of breath EXAM: PORTABLE CHEST 1 VIEW COMPARISON:  Previous studies including the examination of 06/17/2021 FINDINGS: Transverse diameter of heart is increased. Central pulmonary vessels are more prominent. Increased interstitial markings are seen in the parahilar regions and lower lung fields. Small patchy densities are seen in left lower lung field. Lateral CP angles are clear. There is no pneumothorax. Pacemaker battery is seen in the left infraclavicular region. IMPRESSION: Cardiomegaly. Central pulmonary vessels are more prominent suggesting CHF. There are patchy densities in left lower lung fields suggesting atelectasis/pneumonia. Electronically Signed   By: Ernie Avena M.D.   On: 01/23/2023 17:34   DG Tibia/Fibula Right  Result Date: 01/23/2023 CLINICAL DATA:  Pain and swelling EXAM: RIGHT TIBIA AND FIBULA - 2 VIEW COMPARISON:  None Available. FINDINGS: There is diffuse edema in subcutaneous plane along with skin thickening in right lower leg. No fracture or dislocation is seen. Bony spurs are seen in right knee. There is no significant effusion. IMPRESSION: There is diffuse edema in subcutaneous plane in right lower leg suggesting contusion or cellulitis. No fracture or dislocation is seen in right tibia and fibula. Degenerative changes are noted with bony spurs in right knee. Electronically Signed    By: Ernie Avena M.D.   On: 01/23/2023 17:32    Procedures Procedures    Medications Ordered in ED Medications  cefTRIAXone (ROCEPHIN) 2 g in sodium chloride 0.9 % 100 mL IVPB (0 g Intravenous Stopped 01/23/23 2119)  iohexol (OMNIPAQUE) 300 MG/ML solution 75 mL (60 mLs Intravenous Contrast Given 01/23/23 2108)    ED Course/ Medical Decision Making/ A&P                             Medical Decision Making Amount and/or Complexity of Data Reviewed Radiology:  ordered.  Risk Prescription drug management. Decision regarding hospitalization.  Workup here patient's troponin was 9 complete metabolic panel creatinine 1.52 BUN 31 GFR 44.  Which is not too far off from his baseline no leukocytosis white count 5.8 hemoglobin 13.2 platelets at 90 K.  Patient's BMP 124.  Portable chest x-ray showed cardiomegaly with central pulmonary vessels are more prominent suggesting CHF there are patchy densities in the left lower lung field.  Suggesting atelectasis or pneumonia.  Based on this CT chest was done but that showed left groundglass pulmonary nodule measuring 12 mm that we will need follow-up otherwise it was negative.  So no evidence of pneumonia or significant pulmonary edema.  Patient had x-ray of the right tib-fib that was done prior to me seeing him and shows diffuse edema 60 subcutaneous plane in the right lower leg suggesting cellulitis no fracture or dislocation.  No bony abnormalities.  Patient was started on Rocephin for the cellulitis as opposed to Ancef just in case that the CT chest was showing pneumonia.  Patient not true failure of outpatient antibiotics but really was not taking anything and this is gotten worse I think it is best that he is admitted receive IV antibiotics to show improvement and then can be switched over to oral.  Hospitalist will admit.  Final Clinical Impression(s) / ED Diagnoses Final diagnoses:  Cellulitis of right lower extremity  Acute on chronic  congestive heart failure, unspecified heart failure type [facility])    Rx / DC Orders ED Discharge Orders     None         Vanetta Mulders, MD 01/23/23 2340

## 2023-01-23 NOTE — ED Triage Notes (Signed)
Pt reports   Swelling Bilateral legs Ongoing several days More red

## 2023-01-24 ENCOUNTER — Inpatient Hospital Stay (HOSPITAL_COMMUNITY): Payer: Medicare Other

## 2023-01-24 DIAGNOSIS — R911 Solitary pulmonary nodule: Secondary | ICD-10-CM | POA: Insufficient documentation

## 2023-01-24 DIAGNOSIS — R7989 Other specified abnormal findings of blood chemistry: Secondary | ICD-10-CM | POA: Insufficient documentation

## 2023-01-24 DIAGNOSIS — L03115 Cellulitis of right lower limb: Secondary | ICD-10-CM | POA: Diagnosis not present

## 2023-01-24 LAB — CBC
HCT: 36.8 % — ABNORMAL LOW (ref 39.0–52.0)
Hemoglobin: 12.2 g/dL — ABNORMAL LOW (ref 13.0–17.0)
MCH: 26.9 pg (ref 26.0–34.0)
MCHC: 33.2 g/dL (ref 30.0–36.0)
MCV: 81.1 fL (ref 80.0–100.0)
Platelets: 81 10*3/uL — ABNORMAL LOW (ref 150–400)
RBC: 4.54 MIL/uL (ref 4.22–5.81)
RDW: 14 % (ref 11.5–15.5)
WBC: 4.7 10*3/uL (ref 4.0–10.5)
nRBC: 0 % (ref 0.0–0.2)

## 2023-01-24 LAB — COMPREHENSIVE METABOLIC PANEL
ALT: 16 U/L (ref 0–44)
AST: 19 U/L (ref 15–41)
Albumin: 3.4 g/dL — ABNORMAL LOW (ref 3.5–5.0)
Alkaline Phosphatase: 68 U/L (ref 38–126)
Anion gap: 7 (ref 5–15)
BUN: 25 mg/dL — ABNORMAL HIGH (ref 8–23)
CO2: 24 mmol/L (ref 22–32)
Calcium: 8.7 mg/dL — ABNORMAL LOW (ref 8.9–10.3)
Chloride: 107 mmol/L (ref 98–111)
Creatinine, Ser: 1.2 mg/dL (ref 0.61–1.24)
GFR, Estimated: 59 mL/min — ABNORMAL LOW (ref >60)
Glucose, Bld: 127 mg/dL — ABNORMAL HIGH (ref 70–99)
Potassium: 3.8 mmol/L (ref 3.5–5.1)
Sodium: 138 mmol/L (ref 135–145)
Total Bilirubin: 0.5 mg/dL (ref 0.3–1.2)
Total Protein: 5.6 g/dL — ABNORMAL LOW (ref 6.5–8.1)

## 2023-01-24 LAB — PHOSPHORUS: Phosphorus: 3.2 mg/dL (ref 2.5–4.6)

## 2023-01-24 LAB — MAGNESIUM: Magnesium: 2.3 mg/dL (ref 1.7–2.4)

## 2023-01-24 MED ORDER — ASPIRIN 81 MG PO CHEW
81.0000 mg | CHEWABLE_TABLET | Freq: Every day | ORAL | Status: DC
  Administered 2023-01-24 – 2023-01-25 (×2): 81 mg via ORAL
  Filled 2023-01-24 (×2): qty 1

## 2023-01-24 MED ORDER — ONDANSETRON HCL 4 MG/2ML IJ SOLN: 4.0000 mg | Freq: Four times a day (QID) | INTRAMUSCULAR | Status: DC | PRN

## 2023-01-24 MED ORDER — ENOXAPARIN SODIUM 40 MG/0.4ML IJ SOSY
40.0000 mg | PREFILLED_SYRINGE | INTRAMUSCULAR | Status: DC
  Filled 2023-01-24: qty 0.4

## 2023-01-24 MED ORDER — SODIUM CHLORIDE 0.9 % IV SOLN
2.0000 g | INTRAVENOUS | Status: DC
  Administered 2023-01-24 – 2023-01-25 (×2): 2 g via INTRAVENOUS
  Filled 2023-01-24 (×2): qty 20

## 2023-01-24 MED ORDER — LOSARTAN POTASSIUM 50 MG PO TABS
50.0000 mg | ORAL_TABLET | Freq: Every day | ORAL | Status: DC
  Administered 2023-01-24 – 2023-01-25 (×2): 50 mg via ORAL
  Filled 2023-01-24 (×2): qty 1

## 2023-01-24 MED ORDER — ONDANSETRON HCL 4 MG PO TABS: 4.0000 mg | ORAL_TABLET | Freq: Four times a day (QID) | ORAL | Status: DC | PRN

## 2023-01-24 MED ORDER — LEVOTHYROXINE SODIUM 75 MCG PO TABS
75.0000 ug | ORAL_TABLET | Freq: Every morning | ORAL | Status: DC
  Administered 2023-01-24 – 2023-01-25 (×2): 75 ug via ORAL
  Filled 2023-01-24 (×2): qty 1

## 2023-01-24 MED ORDER — ACETAMINOPHEN 325 MG PO TABS: 650.0000 mg | ORAL_TABLET | Freq: Four times a day (QID) | ORAL | Status: DC | PRN

## 2023-01-24 MED ORDER — FUROSEMIDE 10 MG/ML IJ SOLN
40.0000 mg | Freq: Two times a day (BID) | INTRAMUSCULAR | Status: DC
Start: 2023-01-24 — End: 2023-01-24

## 2023-01-24 MED ORDER — FERROUS SULFATE 325 (65 FE) MG PO TABS
325.0000 mg | ORAL_TABLET | Freq: Every day | ORAL | Status: DC
  Administered 2023-01-25: 325 mg via ORAL
  Filled 2023-01-24: qty 1

## 2023-01-24 MED ORDER — SODIUM CHLORIDE 0.9 % IV SOLN: 2.0000 g | INTRAVENOUS | Status: DC

## 2023-01-24 MED ORDER — FUROSEMIDE 40 MG PO TABS: 60.0000 mg | ORAL_TABLET | Freq: Every day | ORAL | Status: DC

## 2023-01-24 MED ORDER — SERTRALINE HCL 50 MG PO TABS
200.0000 mg | ORAL_TABLET | Freq: Every day | ORAL | Status: DC
  Administered 2023-01-24 – 2023-01-25 (×2): 200 mg via ORAL
  Filled 2023-01-24 (×2): qty 4

## 2023-01-24 MED ORDER — FUROSEMIDE 10 MG/ML IJ SOLN: 40.0000 mg | Freq: Two times a day (BID) | INTRAMUSCULAR | Status: DC

## 2023-01-24 MED ORDER — PROCHLORPERAZINE EDISYLATE 10 MG/2ML IJ SOLN: 10.0000 mg | Freq: Four times a day (QID) | INTRAMUSCULAR | Status: DC | PRN

## 2023-01-24 MED ORDER — FUROSEMIDE 10 MG/ML IJ SOLN
40.0000 mg | Freq: Two times a day (BID) | INTRAMUSCULAR | Status: DC
  Administered 2023-01-24 (×2): 40 mg via INTRAVENOUS
  Filled 2023-01-24 (×3): qty 4

## 2023-01-24 MED ORDER — ATORVASTATIN CALCIUM 20 MG PO TABS
20.0000 mg | ORAL_TABLET | Freq: Every day | ORAL | Status: DC
  Administered 2023-01-24 – 2023-01-25 (×2): 20 mg via ORAL
  Filled 2023-01-24 (×2): qty 1

## 2023-01-24 MED ORDER — ACETAMINOPHEN 650 MG RE SUPP: 650.0000 mg | Freq: Four times a day (QID) | RECTAL | Status: DC | PRN

## 2023-01-24 NOTE — Progress Notes (Signed)
PROGRESS NOTE    Hunter [name]  ONG:[id number] DOB: January 01, 1934 DOA: 01/23/2023 PCP: Shelva Majestic, MD   Brief Narrative:    Hunter [name] is a 89 y.o. male with medical history significant of CHF, hypertension, hyperlipidemia, hypothyroidism, CKD stage III, thrombocytopenia, LBBB, pacemaker implant (2018) who presents to the emergency department due to concern for cellulitis to the right leg.  Patient was seen in the ED on 7/3 and was treated with Keflex with assumption the patient to continue to take Keflex at home, however, patient was not taking any antibiotics at home, he returned to the ED today with worsening redness to his right lower extremity which has been worsening within the last few days.   Assessment & Plan:   Principal Problem:   Cellulitis of right lower extremity Active Problems:   Acquired hypothyroidism   Mixed hyperlipidemia   Essential hypertension   Thrombocytopenia (HCC)   Obesity (BMI 30-39.9)   Pulmonary nodule   Elevated brain natriuretic peptide (BNP) level  Assessment and Plan:   Cellulitis of right lower extremity Continue IV ceftriaxone Continue Tylenol as needed Check lower extremity ABI   Elevated BNP, rule out acute on chronic diastolic CHF BNP 440, this may be due to patient's current status Continue total input/output, daily weights and fluid restriction Continue heart healthy diet      Echocardiogram done in May 2024 showed LVEF of 50 to 55%.  No RWMA.  G1 DD No need for repeat echocardiogram, check Reds clip reading and consider further diuresis as needed   Pulmonary nodule CT chest with contrast showed left groundglass pulmonary nodule measuring 12 mm Chest CT 6 months to confirm persistence,  then additional non-contrast Chest CTs every 2 years until 5 years. If nodule grows or develops solid component(s), consider resection.as recommended by radiologist.   Thrombocytopenia (chronic) Platelets 90, continue to monitor platelet  levels   Essential hypertension Continue losartan   Mixed hyperlipidemia Continue Lipitor   Acquired hypothyroidism Continue Synthroid   Obesity (BMI 32.08) Diet and lifestyle modification      DVT prophylaxis: SCDs given thrombocytopenia Code Status: Full Family Communication: None at bedside Disposition Plan:  Status is: Inpatient Remains inpatient appropriate because: Need for ongoing IV antibiotics.   Consultants:  None  Procedures:  None  Antimicrobials:  Anti-infectives (From admission, onward)    Start     Dose/Rate Route Frequency Ordered Stop   01/24/23 1000  cefTRIAXone (ROCEPHIN) 2 g in sodium chloride 0.9 % 100 mL IVPB        2 g 200 mL/hr over 30 Minutes Intravenous Every 24 hours 01/24/23 0502 01/31/23 0959   01/24/23 0600  cefTRIAXone (ROCEPHIN) 2 g in sodium chloride 0.9 % 100 mL IVPB  Status:  Discontinued        2 g 200 mL/hr over 30 Minutes Intravenous Every 24 hours 01/24/23 0501 01/24/23 0502   01/23/23 2030  cefTRIAXone (ROCEPHIN) 2 g in sodium chloride 0.9 % 100 mL IVPB        2 g 200 mL/hr over 30 Minutes Intravenous  Once 01/23/23 2028 01/23/23 2119      Subjective: Patient seen and evaluated today with no new acute complaints or concerns. No acute concerns or events noted overnight.  Objective: Vitals:   01/23/23 2330 01/24/23 0000 01/24/23 0319 01/24/23 0450  BP: 133/68 (!) 159/59  125/60  Pulse: 63 61  63  Resp:  20  20  Temp:  97.7 F (36.5 C)  98  F (36.7 C)  TempSrc:  Oral    SpO2: 91% 100% 100% 100%  Weight:  104.7 kg    Height:  5\' 11"  (1.803 m)      Intake/Output Summary (Last 24 hours) at 01/24/2023 0720 Last data filed at 01/24/2023 0500 Gross per 24 hour  Intake 100 ml  Output 500 ml  Net -400 ml   Filed Weights   01/23/23 1332 01/24/23 0000  Weight: 104.3 kg 104.7 kg    Examination:  General exam: Appears calm and comfortable  Respiratory system: Clear to auscultation. Respiratory effort  normal. Cardiovascular system: S1 & S2 heard, RRR.  Gastrointestinal system: Abdomen is soft Central nervous system: Alert and awake Extremities: No edema Skin: No significant lesions noted Psychiatry: Flat affect.    Data Reviewed: I have personally reviewed following labs and imaging studies  CBC: Recent Labs  Lab 01/23/23 1437  WBC 5.8  NEUTROABS 3.7  HGB 13.2  HCT 39.8  MCV 80.6  PLT 90*   Basic Metabolic Panel: Recent Labs  Lab 01/23/23 1437 01/24/23 0448  NA 137 138  K 4.2 3.8  CL 104 107  CO2 25 24  GLUCOSE 129* 127*  BUN 31* 25*  CREATININE 1.52* 1.20  CALCIUM 9.1 8.7*  MG  --  2.3  PHOS  --  3.2   GFR: Estimated Creatinine Clearance: 54.4 mL/min (by C-G formula based on SCr of 1.2 mg/dL). Liver Function Tests: Recent Labs  Lab 01/23/23 1437 01/24/23 0448  AST 23 19  ALT 19 16  ALKPHOS 80 68  BILITOT 0.6 0.5  PROT 6.3* 5.6*  ALBUMIN 3.9 3.4*   No results for input(s): "LIPASE", "AMYLASE" in the last 168 hours. No results for input(s): "AMMONIA" in the last 168 hours. Coagulation Profile: No results for input(s): "INR", "PROTIME" in the last 168 hours. Cardiac Enzymes: No results for input(s): "CKTOTAL", "CKMB", "CKMBINDEX", "TROPONINI" in the last 168 hours. BNP (last 3 results) No results for input(s): "PROBNP" in the last 8760 hours. HbA1C: No results for input(s): "HGBA1C" in the last 72 hours. CBG: No results for input(s): "GLUCAP" in the last 168 hours. Lipid Profile: No results for input(s): "CHOL", "HDL", "LDLCALC", "TRIG", "CHOLHDL", "LDLDIRECT" in the last 72 hours. Thyroid Function Tests: No results for input(s): "TSH", "T4TOTAL", "FREET4", "T3FREE", "THYROIDAB" in the last 72 hours. Anemia Panel: No results for input(s): "VITAMINB12", "FOLATE", "FERRITIN", "TIBC", "IRON", "RETICCTPCT" in the last 72 hours. Sepsis Labs: No results for input(s): "PROCALCITON", "LATICACIDVEN" in the last 168 hours.  No results found for this or  any previous visit (from the past 240 hour(s)).       Radiology Studies: CT Chest W Contrast  Result Date: 01/23/2023 CLINICAL DATA:  Patchy opacities on recent chest x-ray EXAM: CT CHEST WITH CONTRAST TECHNIQUE: Multidetector CT imaging of the chest was performed during intravenous contrast administration. RADIATION DOSE REDUCTION: This exam was performed according to the departmental dose-optimization program which includes automated exposure control, adjustment of the mA and/or kV according to patient size and/or use of iterative reconstruction technique. CONTRAST:  60mL OMNIPAQUE IOHEXOL 300 MG/ML  SOLN COMPARISON:  Plain film from earlier in the same day. FINDINGS: Cardiovascular: Atherosclerotic calcifications of the thoracic aorta are noted without aneurysmal dilatation or dissection. Pacing device is noted. Pulmonary artery shows no large central pulmonary embolus although timing was not performed for embolus evaluation. No pericardial effusion is seen. Diffuse coronary calcifications are noted. Mediastinum/Nodes: Thoracic inlet is within normal limits. No hilar or mediastinal adenopathy  is noted. The esophagus as visualized is within normal limits. Lungs/Pleura: Lungs are well aerated bilaterally. No focal confluent infiltrate or effusion is seen. Mild scarring is noted in the bases bilaterally. Ground-glass opacity is noted in the posterior aspect of the left lower lobe measuring approximately 12 mm in mean diameter. Upper Abdomen: Visualized upper abdomen is within normal limits. Musculoskeletal: Degenerative changes of the thoracic spine are noted. No compression deformity is seen. IMPRESSION: Left ground-glass pulmonary nodule measuring 12 mm. Per Fleischner Society Guidelines, recommend a non-contrast Chest CT at 6 months to confirm persistence, then additional non-contrast Chest CTs every 2 years until 5 years. If nodule grows or develops solid component(s), consider resection. These  guidelines do not apply to immunocompromised patients and patients with cancer. Follow up in patients with significant comorbidities as clinically warranted. For lung cancer screening, adhere to Lung-RADS guidelines. Reference: Radiology. 2017; 284(1):228-43. No definitive infiltrate is seen. Aortic Atherosclerosis (ICD10-I70.0). Electronically Signed   By: Alcide Clever M.D.   On: 01/23/2023 21:31   DG Chest Port 1 View  Result Date: 01/23/2023 CLINICAL DATA:  Shortness of breath EXAM: PORTABLE CHEST 1 VIEW COMPARISON:  Previous studies including the examination of 06/17/2021 FINDINGS: Transverse diameter of heart is increased. Central pulmonary vessels are more prominent. Increased interstitial markings are seen in the parahilar regions and lower lung fields. Small patchy densities are seen in left lower lung field. Lateral CP angles are clear. There is no pneumothorax. Pacemaker battery is seen in the left infraclavicular region. IMPRESSION: Cardiomegaly. Central pulmonary vessels are more prominent suggesting CHF. There are patchy densities in left lower lung fields suggesting atelectasis/pneumonia. Electronically Signed   By: Ernie Avena M.D.   On: 01/23/2023 17:34   DG Tibia/Fibula Right  Result Date: 01/23/2023 CLINICAL DATA:  Pain and swelling EXAM: RIGHT TIBIA AND FIBULA - 2 VIEW COMPARISON:  None Available. FINDINGS: There is diffuse edema in subcutaneous plane along with skin thickening in right lower leg. No fracture or dislocation is seen. Bony spurs are seen in right knee. There is no significant effusion. IMPRESSION: There is diffuse edema in subcutaneous plane in right lower leg suggesting contusion or cellulitis. No fracture or dislocation is seen in right tibia and fibula. Degenerative changes are noted with bony spurs in right knee. Electronically Signed   By: Ernie Avena M.D.   On: 01/23/2023 17:32        Scheduled Meds:  enoxaparin (LOVENOX) injection  40 mg  Subcutaneous Q24H   Continuous Infusions:  cefTRIAXone (ROCEPHIN)  IV       LOS: 1 day    Time spent: 35 minutes    Saskia Simerson Hoover Brunette, DO [facility]  If 7PM-7AM, please contact night-coverage [email or web address] 01/24/2023, 7:20 AM

## 2023-01-24 NOTE — TOC Initial Note (Signed)
Transition of Care [facility]) - Initial/Assessment Note    Patient Details  Name: Hunter [name] MRN: [id number] Date of Birth: 1934/03/18  Transition of Care [facility]) CM/SW Contact:    Catalina Gravel, LCSW Phone Number: 01/24/2023, 2:41 PM  Clinical Narrative:                 Pt has hx of CHF, admitted related to Cellulitis. Needs medical treatment. TOC to follow for any additional needs.     Barriers to Discharge: Continued Medical Work up   Patient Goals and CMS Choice            Expected Discharge Plan and Services                                              Prior Living Arrangements/Services                       Activities of Daily Living Home Assistive Devices/Equipment: Dan Humphreys (specify type) ADL Screening (condition at time of admission) Patient's cognitive ability adequate to safely complete daily activities?: Yes Is the patient deaf or have difficulty hearing?: Yes Does the patient have difficulty seeing, even when wearing glasses/contacts?: Yes Does the patient have difficulty concentrating, remembering, or making decisions?: Yes Patient able to express need for assistance with ADLs?: Yes Does the patient have difficulty dressing or bathing?: No Independently performs ADLs?: Yes (appropriate for developmental age) Does the patient have difficulty walking or climbing stairs?: Yes Weakness of Legs: Both Weakness of Arms/Hands: None  Permission Sought/Granted                  Emotional Assessment              Admission diagnosis:  Cellulitis of right leg [L03.115] Cellulitis of right lower extremity [L03.115] Acute on chronic congestive heart failure, unspecified heart failure type (HCC) [I50.9] Patient Active Problem List   Diagnosis Date Noted   Pulmonary nodule 01/24/2023   Elevated brain natriuretic peptide (BNP) level 01/24/2023   Acute on chronic diastolic CHF (congestive heart failure) (HCC) 01/14/2023    Hallucinations, unspecified 01/14/2023   Obesity (BMI 30-39.9)    Cellulitis of right lower extremity 06/17/2021   Lumbar stenosis with neurogenic claudication 05/24/2020   Mild cognitive impairment 06/21/2019   Acute blood loss as cause of postoperative anemia 10/26/2017   Delirium 10/24/2017   Cardiac pacemaker 10/22/2017   Fracture of unspecified part of neck of left femur, initial encounter for closed fracture (HCC) 10/21/2017   Complete heart block (HCC) 02/08/2017   BPH (benign prostatic hyperplasia) 05/10/2015   Skin lesion of left lower extremity 03/26/2014   Stasis eczema 03/26/2014   History of skin cancer 03/26/2014   Heart failure with preserved ejection fraction (HFpEF)  12/06/2013   Syncope 08/14/2013   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 06/15/2012   Thrombocytopenia (HCC) 05/23/2012   Neuropathy (HCC) 11/02/2011   CKD (chronic kidney disease) 05/27/2011   Low back pain 04/08/2011   LBBB (left bundle branch block) 05/12/2010   Abnormality of gait 04/23/2010   Spinal stenosis in cervical region 03/21/2010   Constipation 03/19/2010   Urinary frequency 09/05/2009   Acquired hypothyroidism 08/15/2008   Vitamin D deficiency 08/15/2008   Anemia 08/15/2008   Venous stasis 08/15/2008   Mixed hyperlipidemia 02/02/2007   Major depression in  full remission (HCC) 02/02/2007   Essential hypertension 02/02/2007   Allergic rhinitis 02/02/2007   GERD (gastroesophageal reflux disease) 02/02/2007   PCP:  Shelva Majestic, MD Pharmacy:   [facility] - [place], [place] - [address] [place] [address] Phone: [phone] Fax: [phone]  [facility] - [place], [place] - [address] [place] [address] Phone: [phone] Fax: [phone]     Social Determinants of Health (SDOH) Social History: SDOH Screenings   Food Insecurity: No Food Insecurity (01/24/2023)  Housing: Low  Risk  (01/24/2023)  Transportation Needs: No Transportation Needs (01/24/2023)  Utilities: Not At Risk (01/24/2023)  Alcohol Screen: Low Risk  (07/06/2021)  Depression (PHQ2-9): Medium Risk (11/26/2022)  Financial Resource Strain: Low Risk  (11/26/2022)  Physical Activity: Inactive (11/26/2022)  Social Connections: Socially Isolated (11/26/2022)  Stress: Stress Concern Present (11/26/2022)  Tobacco Use: Low Risk  (01/23/2023)   SDOH Interventions:     Readmission Risk Interventions     No data to display

## 2023-01-25 ENCOUNTER — Ambulatory Visit: Payer: Self-pay

## 2023-01-25 DIAGNOSIS — L03115 Cellulitis of right lower limb: Secondary | ICD-10-CM | POA: Diagnosis not present

## 2023-01-25 LAB — CBC
HCT: 37 % — ABNORMAL LOW (ref 39.0–52.0)
Hemoglobin: 12.4 g/dL — ABNORMAL LOW (ref 13.0–17.0)
MCH: 26.8 pg (ref 26.0–34.0)
MCHC: 33.5 g/dL (ref 30.0–36.0)
MCV: 80.1 fL (ref 80.0–100.0)
Platelets: 89 10*3/uL — ABNORMAL LOW (ref 150–400)
RBC: 4.62 MIL/uL (ref 4.22–5.81)
RDW: 13.9 % (ref 11.5–15.5)
WBC: 4.2 10*3/uL (ref 4.0–10.5)
nRBC: 0 % (ref 0.0–0.2)

## 2023-01-25 LAB — BASIC METABOLIC PANEL
Anion gap: 9 (ref 5–15)
BUN: 24 mg/dL — ABNORMAL HIGH (ref 8–23)
CO2: 25 mmol/L (ref 22–32)
Calcium: 8.9 mg/dL (ref 8.9–10.3)
Chloride: 105 mmol/L (ref 98–111)
Creatinine, Ser: 1.38 mg/dL — ABNORMAL HIGH (ref 0.61–1.24)
GFR, Estimated: 50 mL/min — ABNORMAL LOW (ref >60)
Glucose, Bld: 127 mg/dL — ABNORMAL HIGH (ref 70–99)
Potassium: 3.9 mmol/L (ref 3.5–5.1)
Sodium: 139 mmol/L (ref 135–145)

## 2023-01-25 LAB — MAGNESIUM: Magnesium: 2.2 mg/dL (ref 1.7–2.4)

## 2023-01-25 MED ORDER — DOXYCYCLINE HYCLATE 100 MG PO CAPS: 100.0000 mg | ORAL_CAPSULE | Freq: Two times a day (BID) | ORAL | 0 refills | Status: AC

## 2023-01-25 NOTE — Discharge Summary (Signed)
Physician Discharge Summary  Hunter [name] ZOX:[id number] DOB: 1934-03-02 DOA: 01/23/2023  PCP: Shelva Majestic, MD  Admit date: 01/23/2023  Discharge date: 01/25/2023  Admitted From:Home  Disposition:  Home  Recommendations for Outpatient Follow-up:  Follow up with PCP in 1-2 weeks Continue doxycycline as prescribed for 7 more days to complete 10-day course of treatment for cellulitis Continue other home medications as prior including home Lasix  Home Health: None  Equipment/Devices: None  Discharge Condition:Stable  CODE STATUS: Full  Diet recommendation: Heart Healthy  Brief/Interim Summary:  Hunter [name] is a 89 y.o. male with medical history significant of CHF, hypertension, hyperlipidemia, hypothyroidism, CKD stage III, thrombocytopenia, LBBB, pacemaker implant (2018) who presents to the emergency department due to concern for cellulitis to the right leg.  Patient was seen in the ED on 7/3 and was treated with Keflex with assumption the patient to continue to take Keflex at home, however, patient was not taking any antibiotics at home, he returned to the ED today with worsening redness to his right lower extremity which has been worsening within the last few days.  He had some lower extremity edema and received some IV Lasix to help with swelling which has reduced.  His erythema is reducing as well and he appears to be in stable condition for discharge with transition to oral antibiotics as prescribed.  No other acute events or concerns noted throughout the course of this admission.  Discharge Diagnoses:  Principal Problem:   Cellulitis of right lower extremity Active Problems:   Acquired hypothyroidism   Mixed hyperlipidemia   Essential hypertension   Thrombocytopenia (HCC)   Obesity (BMI 30-39.9)   Pulmonary nodule   Elevated brain natriuretic peptide (BNP) level  Principal discharge diagnosis: Cellulitis of right lower extremity with bilateral lower extremity  edema, CHF ruled out.  Discharge Instructions  Discharge Instructions     Diet - low sodium heart healthy   Complete by: As directed    Increase activity slowly   Complete by: As directed       Allergies as of 01/25/2023   No Known Allergies      Medication List     TAKE these medications    amLODipine 2.5 MG tablet Commonly known as: NORVASC TAKE 1 TABLET BY MOUTH DAILY   aspirin 81 MG chewable tablet Chew 81 mg by mouth daily.   atorvastatin 20 MG tablet Commonly known as: LIPITOR TAKE 1 TABLET BY MOUTH DAILY   B-12 PO Take 1 tablet by mouth daily.   doxycycline 100 MG capsule Commonly known as: VIBRAMYCIN Take 1 capsule (100 mg total) by mouth 2 (two) times daily for 7 days.   ferrous sulfate 325 (65 FE) MG tablet Take 325 mg by mouth daily with breakfast.   furosemide 20 MG tablet Commonly known as: LASIX TAKE 3 TABLETS BY MOUTH DAILY   levothyroxine 75 MCG tablet Commonly known as: SYNTHROID TAKE 1 TABLET BY MOUTH EVERY MORNING   losartan 50 MG tablet Commonly known as: COZAAR TAKE 1 TABLET BY MOUTH DAILY   sertraline 100 MG tablet Commonly known as: ZOLOFT TAKE 2 TABLETS BY MOUTH DAILY   Vitamin D (Ergocalciferol) 1.25 MG (50000 UNIT) Caps capsule Commonly known as: DRISDOL TAKE 1 CAPSULE BY MOUTH EVERY 7  DAYS        Follow-up Information     Shelva Majestic, MD. Schedule an appointment as soon as possible for a visit in 1 week(s).   Specialty: Family Medicine Contact  information: [address] [place] [address] [phone]                No Known Allergies  Consultations: None   Procedures/Studies: US ARTERIAL ABI (SCREENING LOWER EXTREMITY)  Result Date: 01/25/2023 CLINICAL DATA:  92319 Cellulitis 92319 EXAM: NONINVASIVE PHYSIOLOGIC VASCULAR STUDY OF BILATERAL LOWER EXTREMITIES TECHNIQUE: Evaluation of both lower extremities were performed at rest, including calculation of ankle-brachial indices with  single level Doppler, pressure and pulse volume recording. COMPARISON:  None Available. FINDINGS: Right ABI:  0.82 Left ABI:  0.80 Right Lower Extremity:  Abnormal monophasic waveforms at the ankle. Left Lower Extremity: Multiphasic distal posterior tibial and monophasic dorsalis pedis waveforms IMPRESSION: Mild bilateral lower extremity arterial occlusive disease at rest. 1 Electronically Signed   By: Corlis Leak M.D.   On: 01/25/2023 08:53   CT Chest W Contrast  Result Date: 01/23/2023 CLINICAL DATA:  Patchy opacities on recent chest x-ray EXAM: CT CHEST WITH CONTRAST TECHNIQUE: Multidetector CT imaging of the chest was performed during intravenous contrast administration. RADIATION DOSE REDUCTION: This exam was performed according to the departmental dose-optimization program which includes automated exposure control, adjustment of the mA and/or kV according to patient size and/or use of iterative reconstruction technique. CONTRAST:  60mL OMNIPAQUE IOHEXOL 300 MG/ML  SOLN COMPARISON:  Plain film from earlier in the same day. FINDINGS: Cardiovascular: Atherosclerotic calcifications of the thoracic aorta are noted without aneurysmal dilatation or dissection. Pacing device is noted. Pulmonary artery shows no large central pulmonary embolus although timing was not performed for embolus evaluation. No pericardial effusion is seen. Diffuse coronary calcifications are noted. Mediastinum/Nodes: Thoracic inlet is within normal limits. No hilar or mediastinal adenopathy is noted. The esophagus as visualized is within normal limits. Lungs/Pleura: Lungs are well aerated bilaterally. No focal confluent infiltrate or effusion is seen. Mild scarring is noted in the bases bilaterally. Ground-glass opacity is noted in the posterior aspect of the left lower lobe measuring approximately 12 mm in mean diameter. Upper Abdomen: Visualized upper abdomen is within normal limits. Musculoskeletal: Degenerative changes of the thoracic  spine are noted. No compression deformity is seen. IMPRESSION: Left ground-glass pulmonary nodule measuring 12 mm. Per Fleischner Society Guidelines, recommend a non-contrast Chest CT at 6 months to confirm persistence, then additional non-contrast Chest CTs every 2 years until 5 years. If nodule grows or develops solid component(s), consider resection. These guidelines do not apply to immunocompromised patients and patients with cancer. Follow up in patients with significant comorbidities as clinically warranted. For lung cancer screening, adhere to Lung-RADS guidelines. Reference: Radiology. 2017; 284(1):228-43. No definitive infiltrate is seen. Aortic Atherosclerosis (ICD10-I70.0). Electronically Signed   By: Alcide Clever M.D.   On: 01/23/2023 21:31   DG Chest Port 1 View  Result Date: 01/23/2023 CLINICAL DATA:  Shortness of breath EXAM: PORTABLE CHEST 1 VIEW COMPARISON:  Previous studies including the examination of 06/17/2021 FINDINGS: Transverse diameter of heart is increased. Central pulmonary vessels are more prominent. Increased interstitial markings are seen in the parahilar regions and lower lung fields. Small patchy densities are seen in left lower lung field. Lateral CP angles are clear. There is no pneumothorax. Pacemaker battery is seen in the left infraclavicular region. IMPRESSION: Cardiomegaly. Central pulmonary vessels are more prominent suggesting CHF. There are patchy densities in left lower lung fields suggesting atelectasis/pneumonia. Electronically Signed   By: Ernie Avena M.D.   On: 01/23/2023 17:34   DG Tibia/Fibula Right  Result Date: 01/23/2023 CLINICAL DATA:  Pain and swelling  EXAM: RIGHT TIBIA AND FIBULA - 2 VIEW COMPARISON:  None Available. FINDINGS: There is diffuse edema in subcutaneous plane along with skin thickening in right lower leg. No fracture or dislocation is seen. Bony spurs are seen in right knee. There is no significant effusion. IMPRESSION: There is  diffuse edema in subcutaneous plane in right lower leg suggesting contusion or cellulitis. No fracture or dislocation is seen in right tibia and fibula. Degenerative changes are noted with bony spurs in right knee. Electronically Signed   By: Ernie Avena M.D.   On: 01/23/2023 17:32   CUP PACEART REMOTE DEVICE CHECK  Result Date: 01/20/2023 Scheduled remote reviewed. Normal device function.  8 AMS, 2-16sec in duration Next remote 91 days. LA, CVRS  CT Head Wo Contrast  Result Date: 01/13/2023 CLINICAL DATA:  Altered mental status EXAM: CT HEAD WITHOUT CONTRAST TECHNIQUE: Contiguous axial images were obtained from the base of the skull through the vertex without intravenous contrast. RADIATION DOSE REDUCTION: This exam was performed according to the departmental dose-optimization program which includes automated exposure control, adjustment of the mA and/or kV according to patient size and/or use of iterative reconstruction technique. COMPARISON:  CT brain 08/06/2020 report FINDINGS: Brain: No acute territorial infarction, hemorrhage or intracranial mass. Atrophy and mild chronic small vessel ischemic changes of the white matter. Nonenlarged ventricles Vascular: No hyperdense vessels.  Carotid vascular calcification Skull: Normal. Negative for fracture or focal lesion. Sinuses/Orbits: No acute finding. Other: None IMPRESSION: 1. No CT evidence for acute intracranial abnormality. 2. Atrophy and mild chronic small vessel ischemic changes of the white matter. Electronically Signed   By: Jasmine Pang M.D.   On: 01/13/2023 18:43     Discharge Exam: Vitals:   01/25/23 0504 01/25/23 1022  BP: 99/65 128/71  Pulse: 64 62  Resp: 20   Temp: 98.2 F (36.8 C)   SpO2: 94% 97%   Vitals:   01/24/23 1406 01/24/23 2103 01/25/23 0504 01/25/23 1022  BP: (!) 131/56 135/75 99/65 128/71  Pulse: (!) 56 62 64 62  Resp: 17 20 20    Temp: 97.7 F (36.5 C) 98.9 F (37.2 C) 98.2 F (36.8 C)   TempSrc:   Oral    SpO2: 100% 97% 94% 97%  Weight:   101 kg   Height:        General: Pt is alert, awake, not in acute distress, hard of hearing Cardiovascular: RRR, S1/S2 +, no rubs, no gallops Respiratory: CTA bilaterally, no wheezing, no rhonchi Abdominal: Soft, NT, ND, bowel sounds + Extremities: Mild bilateral edema, right lower extremity erythema improved    The results of significant diagnostics from this hospitalization (including imaging, microbiology, ancillary and laboratory) are listed below for reference.     Microbiology: No results found for this or any previous visit (from the past 240 hour(s)).   Labs: BNP (last 3 results) Recent Labs    01/23/23 1437  BNP 124.0*   Basic Metabolic Panel: Recent Labs  Lab 01/23/23 1437 01/24/23 0448 01/25/23 0506  NA 137 138 139  K 4.2 3.8 3.9  CL 104 107 105  CO2 25 24 25   GLUCOSE 129* 127* 127*  BUN 31* 25* 24*  CREATININE 1.52* 1.20 1.38*  CALCIUM 9.1 8.7* 8.9  MG  --  2.3 2.2  PHOS  --  3.2  --    Liver Function Tests: Recent Labs  Lab 01/23/23 1437 01/24/23 0448  AST 23 19  ALT 19 16  ALKPHOS 80 68  BILITOT 0.6  0.5  PROT 6.3* 5.6*  ALBUMIN 3.9 3.4*   No results for input(s): "LIPASE", "AMYLASE" in the last 168 hours. No results for input(s): "AMMONIA" in the last 168 hours. CBC: Recent Labs  Lab 01/23/23 1437 01/24/23 0448 01/25/23 0506  WBC 5.8 4.7 4.2  NEUTROABS 3.7  --   --   HGB 13.2 12.2* 12.4*  HCT 39.8 36.8* 37.0*  MCV 80.6 81.1 80.1  PLT 90* 81* 89*   Cardiac Enzymes: No results for input(s): "CKTOTAL", "CKMB", "CKMBINDEX", "TROPONINI" in the last 168 hours. BNP: Invalid input(s): "POCBNP" CBG: No results for input(s): "GLUCAP" in the last 168 hours. D-Dimer No results for input(s): "DDIMER" in the last 72 hours. Hgb A1c No results for input(s): "HGBA1C" in the last 72 hours. Lipid Profile No results for input(s): "CHOL", "HDL", "LDLCALC", "TRIG", "CHOLHDL", "LDLDIRECT" in the last 72  hours. Thyroid function studies No results for input(s): "TSH", "T4TOTAL", "T3FREE", "THYROIDAB" in the last 72 hours.  Invalid input(s): "FREET3" Anemia work up No results for input(s): "VITAMINB12", "FOLATE", "FERRITIN", "TIBC", "IRON", "RETICCTPCT" in the last 72 hours. Urinalysis    Component Value Date/Time   COLORURINE YELLOW 01/13/2023 1758   APPEARANCEUR CLEAR 01/13/2023 1758   LABSPEC 1.019 01/13/2023 1758   PHURINE 6.0 01/13/2023 1758   GLUCOSEU NEGATIVE 01/13/2023 1758   GLUCOSEU NEGATIVE 09/16/2006 1452   HGBUR NEGATIVE 01/13/2023 1758   HGBUR negative 09/05/2009 0000   BILIRUBINUR NEGATIVE 01/13/2023 1758   BILIRUBINUR n 05/17/2012 1201   KETONESUR NEGATIVE 01/13/2023 1758   PROTEINUR 30 (A) 01/13/2023 1758   UROBILINOGEN 0.2 05/17/2012 1201   UROBILINOGEN 0.2 09/05/2009 0000   NITRITE NEGATIVE 01/13/2023 1758   LEUKOCYTESUR NEGATIVE 01/13/2023 1758   Sepsis Labs Recent Labs  Lab 01/23/23 1437 01/24/23 0448 01/25/23 0506  WBC 5.8 4.7 4.2   Microbiology No results found for this or any previous visit (from the past 240 hour(s)).   Time coordinating discharge: 35 minutes  SIGNED:   Erick Blinks, DO [facility] 01/25/2023, 10:43 AM  If 7PM-7AM, please contact night-coverage [email or web address]

## 2023-01-25 NOTE — Plan of Care (Signed)

## 2023-01-25 NOTE — Progress Notes (Signed)
Patient slept the majority of the shift. Patient has had no complaints of pain. Patient oob for standing weight. Tolerated well.

## 2023-01-25 NOTE — Patient Outreach (Signed)
  Care Coordination   Care Coordination   Visit Note   01/25/2023 Name: Hunter [name] MRN: [id number] DOB: 12-21-1933  Hunter [name] is a 89 y.o. year old male who sees [name], [name], MD for primary care. I  noted that patient is hospitalized. RN CM will follow up pending hospital discharge.   What matters to the patients health and wellness today?   N/A     Goals Addressed   None     SDOH assessments and interventions completed:  No     Care Coordination Interventions:  No, not indicated   Follow up plan:  pending hospital discharge    Encounter Outcome:  Pt. Visit Completed   Bary Leriche, RN, MSN [facility] Care Management Care Management Coordinator Direct Line [phone]

## 2023-01-25 NOTE — Progress Notes (Signed)
Mobility Specialist Progress Note:    01/25/23 1025  Mobility  Activity Ambulated with assistance to bathroom  Level of Assistance Contact guard assist, steadying assist  Assistive Device Front wheel walker  Distance Ambulated (ft) 10 ft  Range of Motion/Exercises Active;All extremities  Activity Response Tolerated well  Mobility Referral Yes  $Mobility charge 1 Mobility  Mobility Specialist Start Time (ACUTE ONLY) 1000  Mobility Specialist Stop Time (ACUTE ONLY) 1015  Mobility Specialist Time Calculation (min) (ACUTE ONLY) 15 min   Pt received in bed, agreeable to mobility session. Ambulated with CGA and RW to bathroom. Tolerated well, asx throughout. Pt in bathroom, educated pt on bathroom call light when finished. Will return to transfer to chair in room.    Feliciana Rossetti Mobility Specialist Please contact via Special educational needs teacher or  Rehab office at [phone]

## 2023-01-25 NOTE — Progress Notes (Signed)
Mobility Specialist Progress Note:   01/25/23 1030  Mobility  Activity Transferred to/from [facility]  Level of Assistance Contact guard assist, steadying assist  Assistive Device Front wheel walker  Distance Ambulated (ft) 10 ft  Range of Motion/Exercises Active;All extremities  Activity Response Tolerated well  Mobility Referral Yes  $Mobility charge 1 Mobility  Mobility Specialist Start Time (ACUTE ONLY) 1020  Mobility Specialist Stop Time (ACUTE ONLY) 1030  Mobility Specialist Time Calculation (min) (ACUTE ONLY) 10 min   Pt ambulated with assistance to bed from bathroom via RW and CGA. Tolerated well, asx. Nurse in room. All needs met.   Feliciana Rossetti Mobility Specialist Please contact via Special educational needs teacher or  Rehab office at [phone]

## 2023-01-25 NOTE — Progress Notes (Signed)
Went over discharge instructions w/ pt and Andrey Spearman.

## 2023-01-25 NOTE — Progress Notes (Signed)
Attempted to call patient neighbor Andrey Spearman to talk about discharge and transportation. No answer and unable to leave a message. Will try again later.

## 2023-01-26 ENCOUNTER — Ambulatory Visit: Payer: Self-pay

## 2023-01-26 ENCOUNTER — Telehealth: Payer: Self-pay

## 2023-01-26 NOTE — Chronic Care Management (AMB) (Signed)
   01/26/2023  TORIANO AIKEY 1933/11/29 [id number]    Reason for Encounter: Patient is not currently enrolled in the CCM program. CCM enrollment status changed to Previously Enrolled.   [facility]/Chronic Care Management [phone]

## 2023-01-26 NOTE — Transitions of Care (Post Inpatient/ED Visit) (Signed)
   01/26/2023  Name: [name] MRN: [id number] DOB: 1933/11/27  Today's TOC FU Call Status: Today's TOC FU Call Status:: Unsuccessul Call (1st Attempt) Unsuccessful Call (1st Attempt) Date: 01/26/23  Attempted to reach the patient regarding the most recent Inpatient/ED visit.  Follow Up Plan: Additional outreach attempts will be made to reach the patient to complete the Transitions of Care (Post Inpatient/ED visit) call.      Antionette Fairy, RN,BSN,CCM [facility] Care Management Care Management Community Coordinator Direct Phone: [phone] Toll Free: [phone] Fax: [phone]

## 2023-01-26 NOTE — Transitions of Care (Post Inpatient/ED Visit) (Signed)
01/26/2023  Name: Hunter [name] MRN: [id number] DOB: 1933-12-21  Today's TOC FU Call Status: Today's TOC FU Call Status:: Successful TOC FU Call Competed TOC FU Call Complete Date: 01/26/23 (Voicemail message received from caregiver-Betty. Return call placed to her.)  Transition Care Management Follow-up Telephone Call Date of Discharge: 01/25/23 Discharge [facility] Type of Discharge: Inpatient Admission Primary Inpatient Discharge Diagnosis:: "cellulitis of right lower extremity" How have you been since you were released from the hospital?: Better (She voices pt is in his apt at present but doing good. She checks on him mx times per day and ensures he is taking meds and eating. His leg looks better-taking abxs.) Any questions or concerns?: No  Items Reviewed: Did you receive and understand the discharge instructions provided?: Yes Medications obtained,verified, and reconciled?: Yes (Medications Reviewed) (caregiver states pt gets meds pre-packed from pharmacy) Any new allergies since your discharge?: No Dietary orders reviewed?: Yes Type of Diet Ordered:: low salt/heart healthy Do you have support at home?: Yes People in Home: significant other Name of Support/Comfort Primary Source: Betty-friend checks on him and assists as needed  Medications Reviewed Today: Medications Reviewed Today     Reviewed by Charlyn Minerva, RN (Registered Nurse) on 01/26/23 at 1535  Med List Status: <None>   Medication Order Taking? Sig Documenting Provider Last Dose Status Informant  amLODipine (NORVASC) 2.5 MG tablet [id number] Yes TAKE 1 TABLET BY MOUTH DAILY Shelva Majestic, MD Taking Active Friend, Care Giver, Pharmacy Records  aspirin 81 MG chewable tablet [id number] Yes Chew 81 mg by mouth daily.  [provider] Taking Active Friend, Care Giver, Pharmacy Records  atorvastatin (LIPITOR) 20 MG tablet [id number] Yes TAKE 1 TABLET BY MOUTH DAILY Shelva Majestic, MD Taking Active Friend, Care Giver, Pharmacy Records  Cyanocobalamin (B-12 PO) [id number] Yes Take 1 tablet by mouth daily. [provider] Taking Active Friend, Care Giver, Pharmacy Records  doxycycline (VIBRAMYCIN) 100 MG capsule [id number] Yes Take 1 capsule (100 mg total) by mouth 2 (two) times daily for 7 days. Sherryll Burger, Pratik D, DO Taking Active   ferrous sulfate 325 (65 FE) MG tablet [id number] Yes Take 325 mg by mouth daily with breakfast. [provider] Taking Active Friend, Care Giver, Pharmacy Records  furosemide (LASIX) 20 MG tablet [id number] Yes TAKE 3 TABLETS BY MOUTH DAILY Shelva Majestic, MD Taking Active Friend, Care Giver, Pharmacy Records  levothyroxine (SYNTHROID) 75 MCG tablet [id number] Yes TAKE 1 TABLET BY MOUTH EVERY MORNING Shelva Majestic, MD Taking Active Friend, Care Giver, Pharmacy Records  losartan (COZAAR) 50 MG tablet [id number] Yes TAKE 1 TABLET BY MOUTH DAILY Shelva Majestic, MD Taking Active Friend, Care Giver, Pharmacy Records  sertraline (ZOLOFT) 100 MG tablet [id number] Yes TAKE 2 TABLETS BY MOUTH DAILY Shelva Majestic, MD Taking Active Friend, Care Giver, Pharmacy Records  Vitamin D, Ergocalciferol, (DRISDOL) 1.25 MG (50000 UNIT) CAPS capsule [id number] Yes TAKE 1 CAPSULE BY MOUTH EVERY 7  DAYS Shelva Majestic, MD Taking Active Friend, Care Giver, Pharmacy Records            Home Care and Equipment/Supplies: Were Home Health Services Ordered?: NA Any new equipment or medical supplies ordered?: NA  Functional Questionnaire: Do you need assistance with bathing/showering or dressing?: No Do you need assistance with meal preparation?: No Do you need assistance with eating?: No Do you have difficulty maintaining continence: Yes (wears pull ups) Do you need assistance with getting out of bed/getting out  of a chair/moving?: No Do you have difficulty managing or taking your medications?: Yes (pre-filled from pharmacy)  Follow up  appointments reviewed: PCP Follow-up appointment confirmed?: Yes Date of PCP follow-up appointment?: 02/08/23 Follow-up Provider: Dr. [name] Specialist [facility] Follow-up appointment confirmed?: NA Do you need transportation to your follow-up appointment?: No (caregiver voices pt uses trransportation agency-she calls and sets it up) Do you understand care options if your condition(s) worsen?: Yes-patient verbalized understanding   TOC Interventions Today    Flowsheet Row Most Recent Value  TOC Interventions   TOC Interventions Discussed/Reviewed TOC Interventions Discussed, Arranged PCP follow up less than 12 days/Care Guide scheduled      Interventions Today    Flowsheet Row Most Recent Value  Chronic Disease   Chronic disease during today's visit Congestive Heart Failure (CHF)  General Interventions   General Interventions Discussed/Reviewed General Interventions Discussed, Doctor Visits, Referral to Nurse, [facility], Communication with  [follow up appt scheduled with assigned RN care coordinator, caregiver provided Estate manager/land agent for [employer] guide she has been working with and transportation concierge number [phone] per her request]  Doctor Visits Discussed/Reviewed Doctor Visits Discussed, Specialist, PCP  PCP/Specialist Visits Compliance with follow-up visit  Communication with RN  Education Interventions   Education Provided Provided Education  Provided Verbal Education On Nutrition, When to see the doctor, Medication, Other  Nutrition Interventions   Nutrition Discussed/Reviewed Nutrition Discussed, Adding fruits and vegetables, Fluid intake, Decreasing salt, Increasing proteins, Decreasing fats  Pharmacy Interventions   Pharmacy Dicussed/Reviewed Pharmacy Topics Discussed, Medications and their functions         Alessandra Grout [facility] Care Management Care Management Community Coordinator Direct Phone: [phone] Toll  Free: [phone] Fax: [phone]

## 2023-01-29 ENCOUNTER — Encounter (INDEPENDENT_AMBULATORY_CARE_PROVIDER_SITE_OTHER): Payer: Medicare Other | Admitting: Ophthalmology

## 2023-01-29 DIAGNOSIS — H35033 Hypertensive retinopathy, bilateral: Secondary | ICD-10-CM | POA: Diagnosis not present

## 2023-01-29 DIAGNOSIS — I10 Essential (primary) hypertension: Secondary | ICD-10-CM

## 2023-01-29 DIAGNOSIS — H43813 Vitreous degeneration, bilateral: Secondary | ICD-10-CM | POA: Diagnosis not present

## 2023-01-29 DIAGNOSIS — H353231 Exudative age-related macular degeneration, bilateral, with active choroidal neovascularization: Secondary | ICD-10-CM | POA: Diagnosis not present

## 2023-01-29 DIAGNOSIS — H2511 Age-related nuclear cataract, right eye: Secondary | ICD-10-CM

## 2023-02-01 ENCOUNTER — Telehealth: Payer: Self-pay | Admitting: *Deleted

## 2023-02-01 NOTE — Telephone Encounter (Signed)
Telephone encounter was:  Successful.  02/01/2023 Name: Hunter [name] MRN: [id number] DOB: August 19, 1933  Hunter [name] is a 89 y.o. year old male who is a primary care patient of Hunter [name], Hunter Contes, MD . The community resource team was consulted for assistance with Transportation Needs   Care guide performed the following interventions: Patient provided with information about care guide support team and interviewed to confirm resource needs.  Follow Up Plan:  No further follow up planned at this time. The patient has been provided with needed resources.  Hunter [name] -[facility], Population Health [phone] [address] , [place] [address] Email : Hunter Mao. [name] .Hunter [name] DOB: November 09, 1933 MRN: [id number]   Hunter [name]  For purposes of improving physical access to our facilities, [facility] is pleased to partner with third parties to provide [place] patients or other authorized individuals the option of convenient, on-demand ground transportation services (the Chiropractor") through use of the technology service that enables users to request on-demand ground transportation from independent third-party providers.  By opting to use and accept these [employer], I, the undersigned, hereby agree on behalf of myself, and on behalf of any minor child using the Science writer for whom I am the parent or legal guardian, as follows:  Science writer provided to me are provided by independent third-party transportation providers who are not [employer] or employees and who are unaffiliated with [employer]. [place] is neither a transportation carrier nor a common or public carrier. [facility] has no control over the quality or safety of the transportation that occurs as a result of the [employer]. [place] [facility] cannot guarantee that any third-party transportation  provider will complete any arranged transportation service. [facility] makes no representation, warranty, or guarantee regarding the reliability, timeliness, quality, safety, suitability, or availability of any of the Transport Services or that they will be error free. I fully understand that traveling by vehicle involves risks and dangers of serious bodily injury, including permanent disability, paralysis, and death. I agree, on behalf of myself and on behalf of any minor child using the Transport Services for whom I am the parent or legal guardian, that the entire risk arising out of my use of the [employer] remains solely with me, to the maximum extent permitted under applicable law. The [employer] are provided "as is" and "as available." [place] [facility] disclaims all representations and warranties, express, implied or statutory, not expressly set out in these terms, including the implied warranties of merchantability and fitness for a particular purpose. I hereby waive and release [facility], its agents, employees, officers, directors, representatives, insurers, attorneys, assigns, successors, subsidiaries, and affiliates from any and all past, present, or future claims, demands, liabilities, actions, causes of action, or suits of any kind directly or indirectly arising from acceptance and use of the [employer]. I further waive and release [place] and its affiliates from all present and future [name] and responsibility for any injury or death to persons or damages to property caused by or related to the use of the [employer]. I have read this Waiver and Release of [name], and I understand the terms used in it and their legal significance. This Waiver is freely and voluntarily given with the understanding that my right (as well as the right of any minor child for whom I am the parent or legal guardian using  the [employer]) to legal recourse against [place]  in connection with the [employer] is knowingly surrendered in return for use of these services.   I attest that I read the consent document to Hunter [name], gave Hunter [name] the opportunity to ask questions and answered the questions asked (if any). I affirm that Hunter [name] then provided consent for he's participation in this program.     [name]

## 2023-02-02 ENCOUNTER — Ambulatory Visit: Payer: Self-pay

## 2023-02-02 NOTE — Patient Outreach (Signed)
  Care Coordination   02/02/2023 Name: Hunter [name] MRN: [id number] DOB: 1934-01-25   Care Coordination Outreach Attempts:  An unsuccessful telephone outreach was attempted today to offer the patient information about available care coordination services.  Follow Up Plan:  Additional outreach attempts will be made to offer the patient care coordination information and services.   Encounter Outcome:  No Answer   Care Coordination Interventions:  No, not indicated    Bary Leriche, RN, MSN [facility] Care Management Care Management Coordinator Direct Line [phone]

## 2023-02-03 ENCOUNTER — Telehealth: Payer: Self-pay

## 2023-02-03 NOTE — Patient Outreach (Signed)
  Care Coordination   Follow Up Visit Note   02/03/2023 Name: LAYSON BERTSCH MRN: [id number] DOB: May 14, 1934  ADETOKUNBO MCCADDEN is a 89 y.o. year old male who sees [name], [name], MD for primary care. I  spoke with Andrey Spearman by phone today.  What matters to the patients health and wellness today?  Recovery from cellulitis.    Goals Addressed             This Visit's Progress    Heart Failure Management       Patient Goals/Self Care Activities: -Patient/Caregiver will self-administer medications as prescribed as evidenced by self-report/primary caregiver report  -Patient/Caregiver will attend all scheduled provider appointments as evidenced by clinician review of documented attendance to scheduled appointments and patient/caregiver report -Patient/Caregiver will call provider office for new concerns or questions as evidenced by review of documented incoming telephone call notes and patient report  -Weigh daily and record (notify MD with 3 lb weight gain over night or 5 lb in a week) -Adhere to low sodium diet     Patient with recent hospitalization for cellulitis.  Cellulitis some better. Antibiotic complete. Discussed worsening cellulitis and heart failure management. No concerns.            SDOH assessments and interventions completed:  Yes     Care Coordination Interventions:  Yes, provided   Follow up plan: Follow up call scheduled for August    Encounter Outcome:  Pt. Visit Completed   Bary Leriche, RN, MSN [facility] Care Management Care Management Coordinator Direct Line [phone]

## 2023-02-03 NOTE — Patient Outreach (Signed)
  Care Coordination   02/03/2023 Name: Hunter [name] MRN: [id number] DOB: 1933/10/23   Care Coordination Outreach Attempts:  A second unsuccessful outreach was attempted today to offer the patient with information about available care coordination services.  Follow Up Plan:  Additional outreach attempts will be made to offer the patient care coordination information and services.   Encounter Outcome:  No Answer   Care Coordination Interventions:  No, not indicated    Bary Leriche, RN, MSN [facility] Care Management Care Management Coordinator Direct Line [phone]

## 2023-02-03 NOTE — Patient Instructions (Signed)
Visit Information  Thank you for taking time to visit with me today. Please don't hesitate to contact me if I can be of assistance to you.   Following are the goals we discussed today:   Goals Addressed             This Visit's Progress    Heart Failure Management       Patient Goals/Self Care Activities: -Patient/Caregiver will self-administer medications as prescribed as evidenced by self-report/primary caregiver report  -Patient/Caregiver will attend all scheduled provider appointments as evidenced by clinician review of documented attendance to scheduled appointments and patient/caregiver report -Patient/Caregiver will call provider office for new concerns or questions as evidenced by review of documented incoming telephone call notes and patient report  -Weigh daily and record (notify MD with 3 lb weight gain over night or 5 lb in a week) -Adhere to low sodium diet     Patient with recent hospitalization for cellulitis.  Cellulitis some better. Antibiotic complete. Discussed worsening cellulitis and heart failure management. No concerns.            Our next appointment is by telephone on 02/23/23 at 1030 am  Please call the care guide team at [phone] if you need to cancel or reschedule your appointment.   If you are experiencing a Mental Health or Behavioral Health Crisis or need someone to talk to, please call the Suicide and Crisis Lifeline: 988   Patient verbalizes understanding of instructions and care plan provided today and agrees to view in MyChart. Active MyChart status and patient understanding of how to access instructions and care plan via MyChart confirmed with patient.     The patient has been provided with contact information for the care management team and has been advised to call with any health related questions or concerns.   Bary Leriche, RN, MSN [facility] Care Management Care Management Coordinator Direct Line [phone]

## 2023-02-05 ENCOUNTER — Ambulatory Visit (INDEPENDENT_AMBULATORY_CARE_PROVIDER_SITE_OTHER): Payer: Medicare Other | Admitting: Family Medicine

## 2023-02-05 ENCOUNTER — Encounter: Payer: Self-pay | Admitting: Family Medicine

## 2023-02-05 VITALS — BP 134/68 | HR 67 | Temp 97.8°F | Ht 71.0 in | Wt 224.2 lb

## 2023-02-05 DIAGNOSIS — H2511 Age-related nuclear cataract, right eye: Secondary | ICD-10-CM | POA: Diagnosis not present

## 2023-02-05 DIAGNOSIS — L03115 Cellulitis of right lower limb: Secondary | ICD-10-CM | POA: Diagnosis not present

## 2023-02-05 DIAGNOSIS — E039 Hypothyroidism, unspecified: Secondary | ICD-10-CM

## 2023-02-05 DIAGNOSIS — Z961 Presence of intraocular lens: Secondary | ICD-10-CM | POA: Diagnosis not present

## 2023-02-05 DIAGNOSIS — E559 Vitamin D deficiency, unspecified: Secondary | ICD-10-CM

## 2023-02-05 DIAGNOSIS — I10 Essential (primary) hypertension: Secondary | ICD-10-CM

## 2023-02-05 DIAGNOSIS — L989 Disorder of the skin and subcutaneous tissue, unspecified: Secondary | ICD-10-CM

## 2023-02-05 DIAGNOSIS — E782 Mixed hyperlipidemia: Secondary | ICD-10-CM | POA: Diagnosis not present

## 2023-02-05 DIAGNOSIS — H3581 Retinal edema: Secondary | ICD-10-CM | POA: Diagnosis not present

## 2023-02-05 DIAGNOSIS — F3342 Major depressive disorder, recurrent, in full remission: Secondary | ICD-10-CM

## 2023-02-05 DIAGNOSIS — F0392 Unspecified dementia, unspecified severity, with psychotic disturbance: Secondary | ICD-10-CM

## 2023-02-05 DIAGNOSIS — I5032 Chronic diastolic (congestive) heart failure: Secondary | ICD-10-CM | POA: Diagnosis not present

## 2023-02-05 LAB — CBC WITH DIFFERENTIAL/PLATELET
Absolute Monocytes: 454 cells/uL (ref 200–950)
Basophils Absolute: 32 cells/uL (ref 0–200)
Basophils Relative: 0.5 %
Eosinophils Absolute: 70 cells/uL (ref 15–500)
Eosinophils Relative: 1.1 %
HCT: 42.9 % (ref 38.5–50.0)
Hemoglobin: 14.1 g/dL (ref 13.2–17.1)
Lymphs Abs: 2067 cells/uL (ref 850–3900)
MCH: 25.9 pg — ABNORMAL LOW (ref 27.0–33.0)
MCHC: 32.9 g/dL (ref 32.0–36.0)
MCV: 78.9 fL — ABNORMAL LOW (ref 80.0–100.0)
MPV: 12.7 fL — ABNORMAL HIGH (ref 7.5–12.5)
Monocytes Relative: 7.1 %
Neutro Abs: 3776 cells/uL (ref 1500–7800)
Neutrophils Relative %: 59 %
Platelets: 123 10*3/uL — ABNORMAL LOW (ref 140–400)
RBC: 5.44 10*6/uL (ref 4.20–5.80)
RDW: 14.2 % (ref 11.0–15.0)
Total Lymphocyte: 32.3 %
WBC: 6.4 10*3/uL (ref 3.8–10.8)

## 2023-02-05 NOTE — Assessment & Plan Note (Signed)
Possible mild exacerbation of CHF treated with IV lasix in hospital now back on home doses doing reasonably well.

## 2023-02-05 NOTE — Assessment & Plan Note (Signed)
Listed as MCI previously but appears to be dementia now- I have known patient for 7 years and this is 21st time I have seen him and he did not know who I was today. This is going to make caring for him very challenging. He is easily tipping into delirium and having hallucinations- considered rexulti (did not review all contraindications) but when I discussed risks he and his supporting neighbor were not interested. I think adding a therapist for underlying depression may help- reached out to [facility]) to see if they can help- he would not be able to coordinate on his own.

## 2023-02-05 NOTE — Assessment & Plan Note (Signed)
Well controlled in past but no recent checks including in hospital Lab Results  Component Value Date   TSH 3.70 11/14/2021  Hopefully stable- update TSH  today. Continue current meds for now

## 2023-02-05 NOTE — Assessment & Plan Note (Signed)
Well controlled on amlodipine 2.5 mg (not ideal for CHF), losartan 40 mg, and lasix 60 mg daily- continue current medications

## 2023-02-05 NOTE — Patient Instructions (Addendum)
  Please stop by lab before you go If you have mychart- we will send your results within 3 business days of [place] receiving them.  If you do not have mychart- we will call you about results within 5 business days of [place] receiving them.  *please also note that you will see labs on mychart as soon as they post. I will later go in and write notes on them- will say "notes from Dr. [name]"   Thrilled you are doing ok- really appreciate the support from your sweet friend to get you to the right place  We have placed a referral for you today to dermatology- Dr. Margo Aye. In some cases you will see # listed below- you can call this if you have not heard within a week. If you do not see # listed- you should receive a mychart message or phone call within a week with the # to call. Reach out to [place] if you ar enot scheduled within 2 weeks  Recommended follow up: Return in about 4 months (around 06/08/2023) for followup or sooner if needed.Schedule b4 you leave.

## 2023-02-05 NOTE — Progress Notes (Signed)
Phone [phone]   Subjective:  Hunter [name] is a 89 y.o. year old very pleasant male patient who presents for transitional care management and hospital follow up for right lower leg cellulitis. Patient was hospitalized from 01/23/2023 to 01/25/2023. A TCM phone call was completed on 01/26/2023. Medical complexity moderate  Patient presented to the hospital on 01/13/2023 and diagnosed with  cellulitis (came in with confusion and hallucinations- UDS negative, CT head reassuring) of right lower legshe was treated with Keflex with plan for him to continue this at home but patient did not take antibiotics at home (he states pharmacy did not have the prescription) and returned to the emergency department on 01/23/2023 with worsening redness of the right lower extremity.  Patient with baseline heart failure with preserved ejection fraction-had mildly elevated BNP at 124-he was treated with Lasix to help with edema by IV instead of his baseline 60 mg Lasix daily.  Antibiotics were used in the hospital and redness began to recede-he was discharged on doxycycline 100 mg twice daily as well as Lasix 60 mg daily to help keep fluid down.  He has baseline history of venous insufficiency as well as stasis dermatitis and in the past has worn compression stockings- has not been able to get these on lately- very difficult  -Labs reviewed from hospitalization-very mild anemia on day of discharge chronic thrombocytopenia Noted but stable. Phosphorus level is normal as well as magnesium. Kdiney function with creatinine around 1.4 during hospital stay.   He was maintained on amlodipine 2.5 mg for hypertension along with losartan 50 mg in additon to lasix listed above) .  He was maintained on atorvastatin 20 mg daily for hyperlipidemia.  His hypothyroidism was maintained on levothyroxine 75 mcg.  Depression was maintained on Zoloft 200 mg.  Creatinine roughly stable around 1.4 throughout hospitalization. Stil having some  hallucinations that come and go- gets worried that son is still in the home (son does not have access to him) and feels there are 2-3 people in the bedroom.   I have not seen him in a year but strongly appears he has progressed to dementia- he is still living alone in an apartment. Has very supporitive friend with him today.    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 06/15/2012    Priority: High   Lumbar stenosis with neurogenic claudication 05/24/2020    Priority: Medium    Acute blood loss as cause of postoperative anemia 10/26/2017    Priority: Medium    Thrombocytopenia (HCC) 05/23/2012    Priority: Medium    Neuropathy (HCC) 11/02/2011    Priority: Medium    CKD (chronic kidney disease) 05/27/2011    Priority: Medium    Acquired hypothyroidism 08/15/2008    Priority: Medium    Anemia 08/15/2008    Priority: Medium    Venous stasis 08/15/2008    Priority: Medium    Mixed hyperlipidemia 02/02/2007    Priority: Medium  Major depression in full remission (HCC) 02/02/2007    Priority: Medium    Essential hypertension 02/02/2007    Priority: Medium    Cellulitis of right lower extremity 06/17/2021    Priority: Low   Delirium 10/24/2017    Priority: Low   Fracture of unspecified part of neck of left femur, initial encounter for closed fracture (HCC) 10/21/2017    Priority: Low   Skin lesion of left lower extremity 03/26/2014    Priority: Low   Stasis eczema 03/26/2014    Priority: Low    History of skin cancer 03/26/2014    Priority: Low   Syncope 08/14/2013    Priority: Low   Low back pain 04/08/2011    Priority: Low   LBBB (left bundle branch block) 05/12/2010    Priority: Low   Abnormality of gait 04/23/2010    Priority: Low   Spinal stenosis in cervical region 03/21/2010    Priority: Low   Constipation 03/19/2010    Priority: Low   Urinary frequency 09/05/2009    Priority: Low   Vitamin D deficiency 08/15/2008    Priority: Low   Allergic rhinitis 02/02/2007    Priority: Low   GERD (gastroesophageal reflux disease) 02/02/2007    Priority: Low   Pulmonary nodule 01/24/2023    Medications- reviewed and updated  A medical reconciliation was performed comparing current medicines to hospital discharge medications. Current Outpatient Medications  Medication Sig Dispense Refill   amLODipine (NORVASC) 2.5 MG tablet TAKE 1 TABLET BY MOUTH DAILY 30 tablet 3   aspirin 81 MG chewable tablet Chew 81 mg by mouth daily.      atorvastatin (LIPITOR) 20 MG tablet TAKE 1 TABLET BY MOUTH DAILY 90 tablet 3   Cyanocobalamin (B-12 PO) Take 1 tablet by mouth daily.     ferrous sulfate 325 (65 FE) MG tablet Take 325 mg by mouth daily with breakfast.     furosemide (LASIX) 20 MG tablet TAKE 3 TABLETS BY MOUTH DAILY 270 tablet 3   levothyroxine (SYNTHROID) 75 MCG tablet TAKE 1 TABLET BY MOUTH EVERY MORNING 90 tablet 1   losartan (COZAAR) 50 MG tablet TAKE 1 TABLET BY MOUTH DAILY 90 tablet 1   sertraline (ZOLOFT) 100 MG tablet TAKE 2 TABLETS BY MOUTH DAILY 180 tablet 3   Vitamin D, Ergocalciferol, (DRISDOL) 1.25 MG (50000 UNIT) CAPS capsule TAKE 1 CAPSULE BY MOUTH EVERY 7  DAYS 15 capsule 2   No current facility-administered medications for this visit.   Objective  Objective:  BP 134/68   Pulse 67   Temp 97.8 F (36.6 C)   Ht 5\' 11"  (1.803 m)   Wt 224 lb 3.2 oz (101.7 kg)   SpO2 98%   BMI 31.27 kg/m  Gen: NAD, resting comfortably, pleasantly confused CV: RRR no  murmurs rubs or gallops Lungs: CTAB no crackles, wheeze, rhonchi Abdomen: soft/nontender/nondistended/normal bowel sounds. No rebound or guarding.  Ext: 1+ edema bilaterally with venous stasis skin changes- several lesions on both legs- on left leg there are some horned lesions worrisome for skin cancer Skin: warm, dry Neuro: looks to friend for some answers, walks with walker     Assessment and Plan:   #Hospital follow up  /TCM  Cellulitis of right lower extremity Has resolved with appropriate medical therapy with doxycycline outpatient after intpatient treatment. Does have onychomycosis (which could contribute) and would consider treatment but already with down mood and hallucinations I worry about lamisil risk as well as venous stasis skin changes  which place him at risk for infection . We opted to update labs  including for chronic medical conditoins as he has nto seen me in nearly a year -he also has some horned lesions on the leg and I worry about skin cancer- referring to dermatology but going of the reach out to [facility])- patient has dementia and limited support other than a neighbor who brought him today who is very helpful   Heart failure with preserved ejection fraction (HFpEF)  Possible mild exacerbation of CHF treated with IV lasix in hospital now back on home doses doing reasonably well.  Dementia (HCC) Listed as MCI previously but appears to be dementia now- I have known patient for 7 years and this is 21st time I have seen him and he did not know who I was today. This is going to make caring for him very challenging. He is easily tipping into delirium and having hallucinations- considered rexulti (did not review all contraindications) but when I discussed risks he and his supporting neighbor were not interested. I think adding a therapist for underlying depression may help- reached out to [facility]) to see if they can help- he would not  be able to coordinate on his own.   Essential hypertension Well controlled on amlodipine 2.5 mg (not ideal for CHF), losartan 40 mg, and lasix 60 mg daily- continue current medications   Acquired hypothyroidism Well controlled in past but no recent checks including in hospital Lab Results  Component Value Date   TSH 3.70 11/14/2021  Hopefully stable- update TSH  today. Continue current meds for now   Major depression in full remission (HCC) Reports some anhedonia and depressed mood. Most recnet phq9s in last year have been above 5 despite sertraline 200 mg- considered geriatric psychiatry or counseling but I worry abotu his handling of more complex medications such as antipsychotics- therapy seemed like a safer option.   Vitamin D deficiency Has been low in past- no recent checks- update vitamin D today  Recommended follow up: Return in about 4 months (around 06/08/2023) for followup or sooner if needed.Schedule b4 you leave. Future Appointments  Date Time Provider Department Center  02/23/2023 10:30 AM Fleeta Emmer, RN [facility] None  03/10/2023 12:50 PM Sherrie George, MD [facility] None  04/21/2023 11:10 AM [facility] DEVICE REMOTES [facility]  05/31/2023  1:00 PM Antoine Poche, MD [facility]  06/08/2023  2:20 PM Shelva Majestic, MD [facility]  07/21/2023 11:10 AM [facility] DEVICE REMOTES [facility]  10/15/2023 11:15 AM Mealor, Roberts Gaudy, MD [facility]  12/02/2023 10:00 AM [facility] ANNUAL WELLNESS VISIT 1 [facility]    Lab/Order associations:   ICD-10-CM   1. Cellulitis of right lower extremity  L03.115     2. Skin lesion  L98.9 Ambulatory referral to Dermatology    3. Vitamin D deficiency  E55.9 VITAMIN D 25 Hydroxy (Vit-D Deficiency, Fractures)    VITAMIN D 25 Hydroxy (Vit-D Deficiency, Fractures)    4. Chronic heart failure with preserved ejection fraction (HCC)  I50.32     5. Dementia with psychotic disturbance,  unspecified dementia severity, unspecified dementia type (HCC)  F03.92     6. Recurrent major depressive disorder, in full remission (HCC)  F33.42     7. Mixed hyperlipidemia  E78.2 Lipid panel    Lipid panel    8. Acquired hypothyroidism  E03.9 TSH    TSH    9. Essential hypertension  I10 Comprehensive metabolic  panel    CBC with Differential/Platelet    Comprehensive metabolic panel    CBC with Differential/Platelet      No orders of the defined types were placed in this encounter.   Return precautions advised.  Tana Conch, MD

## 2023-02-05 NOTE — Assessment & Plan Note (Signed)
Has resolved with appropriate medical therapy with doxycycline outpatient after intpatient treatment. Does have onychomycosis (which could contribute) and would consider treatment but already with down mood and hallucinations I worry about lamisil risk as well as venous stasis skin changes which place him at risk for infection . We opted to update labs  including for chronic medical conditoins as he has nto seen me in nearly a year -he also has some horned lesions on the leg and I worry about skin cancer- referring to dermatology but going of the reach out to [facility])- patient has dementia and limited support other than a neighbor who brought him today who is very helpful

## 2023-02-05 NOTE — Assessment & Plan Note (Signed)
Has been low in past- no recent checks- update vitamin D today

## 2023-02-05 NOTE — Assessment & Plan Note (Signed)
Reports some anhedonia and depressed mood. Most recnet phq9s in last year have been above 5 despite sertraline 200 mg- considered geriatric psychiatry or counseling but I worry abotu his handling of more complex medications such as antipsychotics- therapy seemed like a safer option.

## 2023-02-08 ENCOUNTER — Inpatient Hospital Stay: Payer: Medicare Other | Admitting: Family Medicine

## 2023-02-10 ENCOUNTER — Telehealth: Payer: Self-pay | Admitting: Family Medicine

## 2023-02-10 NOTE — Progress Notes (Signed)
Remote pacemaker transmission.   

## 2023-02-10 NOTE — Telephone Encounter (Signed)
Pt would like a call back with lab results 

## 2023-02-10 NOTE — Telephone Encounter (Signed)
Called and pt mailbox full, unable to leave message.

## 2023-02-23 ENCOUNTER — Ambulatory Visit: Payer: Self-pay

## 2023-02-23 NOTE — Patient Outreach (Signed)
  Care Coordination   Follow Up Visit Note   02/23/2023 Name: Hunter [name] MRN: [id number] DOB: 11-17-1933  Hunter [name] is a 89 y.o. year old male who sees [name], [name], MD for primary care. I  spoke with Andrey Spearman by phone today.   What matters to the patients health and wellness today?  Maintain health    Goals Addressed             This Visit's Progress    Heart Failure Management       Patient Goals/Self Care Activities: -Patient/Caregiver will self-administer medications as prescribed as evidenced by self-report/primary caregiver report  -Patient/Caregiver will attend all scheduled provider appointments as evidenced by clinician review of documented attendance to scheduled appointments and patient/caregiver report -Patient/Caregiver will call provider office for new concerns or questions as evidenced by review of documented incoming telephone call notes and patient report  -Weigh daily and record (notify MD with 3 lb weight gain over night or 5 lb in a week) -Adhere to low sodium diet     Patient doing okay.  Legs some better.  Discussed signs of worsening cellulitis. Discussed heart failure management.  Dermatology referral pending.  [name] to follow with dermatology office.  Patient not following fluid restriction.  Discussed possible therapy referral due to hallucinations.  Therapy declined at this time.              SDOH assessments and interventions completed:  Yes     Care Coordination Interventions:  Yes, provided   Follow up plan: Follow up call scheduled for August    Encounter Outcome:  Pt. Visit Completed   Bary Leriche, RN, MSN [facility] Care Management Care Management Coordinator Direct Line [phone]

## 2023-02-23 NOTE — Patient Instructions (Addendum)
Visit Information  Thank you for taking time to visit with me today. Please don't hesitate to contact me if I can be of assistance to you.   Following are the goals we discussed today:   Goals Addressed             This Visit's Progress    Heart Failure Management       Patient Goals/Self Care Activities: -Patient/Caregiver will self-administer medications as prescribed as evidenced by self-report/primary caregiver report  -Patient/Caregiver will attend all scheduled provider appointments as evidenced by clinician review of documented attendance to scheduled appointments and patient/caregiver report -Patient/Caregiver will call provider office for new concerns or questions as evidenced by review of documented incoming telephone call notes and patient report  -Weigh daily and record (notify MD with 3 lb weight gain over night or 5 lb in a week) -Adhere to low sodium diet     Patient doing okay.  Legs some better.  Discussed signs of worsening cellulitis. Discussed heart failure management.  Dermatology referral pending.  [name] to follow with dermatology office.  Patient not following fluid restriction.  Discussed possible therapy referral due to hallucinations.  Therapy declined at this time.              Our next appointment is by telephone on 03/08/23 at 1030 am  Please call the care guide team at [phone] if you need to cancel or reschedule your appointment.   If you are experiencing a Mental Health or Behavioral Health Crisis or need someone to talk to, please call the Suicide and Crisis Lifeline: 988   The patient verbalized understanding of instructions, educational materials, and care plan provided today and DECLINED offer to receive copy of patient instructions, educational materials, and care plan.   The patient has been provided with contact information for the care management team and has been advised to call with any health related questions or concerns.     Bary Leriche, RN, MSN [facility] Care Management Care Management Coordinator Direct Line [phone]

## 2023-02-24 DIAGNOSIS — N390 Urinary tract infection, site not specified: Secondary | ICD-10-CM | POA: Diagnosis not present

## 2023-02-24 DIAGNOSIS — Z79899 Other long term (current) drug therapy: Secondary | ICD-10-CM | POA: Diagnosis not present

## 2023-02-24 DIAGNOSIS — R5381 Other malaise: Secondary | ICD-10-CM | POA: Diagnosis not present

## 2023-02-24 DIAGNOSIS — I2510 Atherosclerotic heart disease of native coronary artery without angina pectoris: Secondary | ICD-10-CM | POA: Diagnosis not present

## 2023-02-24 DIAGNOSIS — Z87891 Personal history of nicotine dependence: Secondary | ICD-10-CM | POA: Diagnosis not present

## 2023-02-24 DIAGNOSIS — Z7982 Long term (current) use of aspirin: Secondary | ICD-10-CM | POA: Diagnosis not present

## 2023-02-24 DIAGNOSIS — Z743 Need for continuous supervision: Secondary | ICD-10-CM | POA: Diagnosis not present

## 2023-02-24 DIAGNOSIS — I509 Heart failure, unspecified: Secondary | ICD-10-CM | POA: Diagnosis not present

## 2023-03-03 ENCOUNTER — Telehealth: Payer: Self-pay | Admitting: *Deleted

## 2023-03-03 NOTE — Telephone Encounter (Signed)
Telephone encounter was:  Successful.  03/03/2023 Name: Hunter [name] MRN: [id number] DOB: July 12, 1934  Hunter [name] is a 89 y.o. year old male who is a primary care patient of [name], [name], MD . The community resource team was consulted for assistance with Transportation Needs   Care guide performed the following interventions: Patient provided with information about care guide support team and interviewed to confirm resource needs.Friend called concierge line for transportation next wed to [facility] [place]   Follow Up Plan:  No further follow up planned at this time. The patient has been provided with needed resources.  Hunter [name] -[facility] [place], Population Health [phone] [address] [place] , [place] [address] Email : [name] DOB: [birth year] MRN: [id number]   Hunter WAIVER AND RELEASE OF LIABILITY  For purposes of improving physical access to our facilities, [facility] is pleased to partner with third parties to provide [facility] patients or other authorized individuals the option of convenient, on-demand ground transportation services (the [employer]") through use of the technology service that enables users to request on-demand ground transportation from independent third-party providers.  By opting to use and accept these [employer], Hunter [name], the undersigned, hereby agree on behalf of myself, and on behalf of any minor child using the Science writer for whom Hunter [name] am the parent or legal guardian, as follows:  Science writer provided to me are provided by independent third-party transportation providers who are not [employer] or employees and who are unaffiliated with [employer]. [facility] is neither a transportation carrier nor a common or public carrier. [facility] has no control over the quality or safety of the transportation that occurs as a result of the  [employer]. [facility] cannot guarantee that any third-party transportation provider will complete any arranged transportation service. [facility] makes no representation, warranty, or guarantee regarding the reliability, timeliness, quality, safety, suitability, or availability of any of the Transport Services or that they will be error free. Hunter [name] fully understand that traveling by vehicle involves risks and dangers of serious bodily injury, including permanent disability, paralysis, and death. Hunter [name] agree, on behalf of myself and on behalf of any minor child using the Transport Services for whom Hunter [name] am the parent or legal guardian, that the entire risk arising out of my use of the [employer] remains solely with me, to the maximum extent permitted under applicable law. The [employer] are provided "as is" and "as available." [facility] disclaims all representations and warranties, express, implied or statutory, not expressly set out in these terms, including the implied warranties of merchantability and fitness for a particular purpose. Hunter [name] hereby waive and release [facility], its agents, employees, officers, directors, representatives, insurers, attorneys, assigns, successors, subsidiaries, and affiliates from any and all past, present, or future claims, demands, liabilities, actions, causes of action, or suits of any kind directly or indirectly arising from acceptance and use of the [employer]. Hunter [name] further waive and release  and its affiliates from all present and future liability and responsibility for any injury or death to persons or damages to property caused by or related to the use of the [employer]. Hunter [name] have read this Waiver and Release of Liability, and Hunter [name] understand the terms used in it and their legal significance. This Waiver is freely and voluntarily given with the understanding that my right (as well as the right  of any minor child for whom Hunter [name] am the parent  or legal guardian using the [employer]) to legal recourse against [facility] in connection with the [employer] is knowingly surrendered in return for use of these services.   Hunter [name] attest that Hunter [name] read the consent document to Hunter [name], gave Hunter [name] the opportunity to ask questions and answered the questions asked (if any). Hunter [name] affirm that Hunter [name] then provided consent for he's participation in this program.     Hunter [name]  Transportation Exception Form  * If the practice/clinic is more than 25 miles from the patient's address, please provide the following information:    Patients Name Hunter [name] Pick Up Date 03/08/2023  Patient DOB 06-05-1934 Practice Location [address] [facility]   Patient MRN [id number]    Patient Home Address [address] [place] [address]      Description of Clinical Conditions Necessitating Transport   [facility] dr Hunter [name] see to drive after  appointment        Electronic Signature of Requester: Hunter [name]  DATE:  03/03/2023

## 2023-03-08 ENCOUNTER — Telehealth: Payer: Self-pay

## 2023-03-08 ENCOUNTER — Ambulatory Visit: Payer: Self-pay

## 2023-03-08 NOTE — Telephone Encounter (Signed)
Spoke with Misty Stanley and she has spoken with Dermatology and they are going to reach out to Dr. Marcelo Baldy office in [place] to get pt scheduled.

## 2023-03-08 NOTE — Patient Outreach (Signed)
  Care Coordination   Follow Up Visit Note   03/08/2023 Name: Hunter [name] MRN: [id number] DOB: Nov 14, 1933  Hunter [name] is a 89 y.o. year old male who sees [name], [name], MD for primary care. I  spoke with caregiver Andrey Spearman by phone today.  What matters to the patients health and wellness today?  Maintain health.     Goals Addressed             This Visit's Progress    Heart Failure Management       Patient Goals/Self Care Activities: -Patient/Caregiver will self-administer medications as prescribed as evidenced by self-report/primary caregiver report  -Patient/Caregiver will attend all scheduled provider appointments as evidenced by clinician review of documented attendance to scheduled appointments and patient/caregiver report -Patient/Caregiver will call provider office for new concerns or questions as evidenced by review of documented incoming telephone call notes and patient report  -Weigh daily and record (notify MD with 3 lb weight gain over night or 5 lb in a week) -Adhere to low sodium diet     Spoke with caregiver [name] she reports patient doing okay. ED visit recently for hallucinations.  Discussed counseling and possible neurology.  She states that she will just continue to monitor patient.  She reports legs are okay at this time. Patient not really following fluid restriction and diet.  Encouraged her to try to talk with patient about diet adherence.  She verbalized understanding.           SDOH assessments and interventions completed:  Yes     Care Coordination Interventions:  Yes, provided   Follow up plan: Follow up call scheduled for September    Encounter Outcome:  Pt. Visit Completed

## 2023-03-08 NOTE — Telephone Encounter (Signed)
-----   Message from Tana Conch sent at 02/23/2023  5:59 PM EDT ----- Regarding: RE: depression-update Ardine Bjork can you check on status of dermatology referral from our end? Would love to get this set up for him.   Dionne- thanks for updates and for trying to help him ----- Message ----- From: Fleeta Emmer, RN Sent: 02/23/2023  11:05 AM EDT To: Shelva Majestic, MD; Gwenette Greet, CMA Subject: RE: depression-update                          Hello,  I spoke with his friend Ms. Su Hilt today.  Dermatology referral still pending. She states she will call Dr. Margo Aye in [place] about the referral status. She states she can drive to that appointment if it is in [place].  Our transportation staff have been working with her on transportation to appointments.   I also discussed getting [facility] LCSW involved for counseling/therapy due to dementia with some hallucinations to get connected with a therapist.  This was declined presently but advised to let me know if they are ready to proceed.  Please let me know we can assist further.   Thanks,  Bary Leriche, RN, MSN [facility] Care Management Care Management Coordinator Direct Line [phone] ----- Message ----- From: Shelva Majestic, MD Sent: 02/05/2023   9:26 PM EDT To: Gwenette Greet, CMA; Fleeta Emmer, RN Subject: depression                                     On top of dermatology visit I think hed benefit form therapist but doubt his ability to coordinate with our referral system- any help you can provide? My team can place orders if needed- Alma Muegge connected

## 2023-03-08 NOTE — Patient Instructions (Signed)
Visit Information  Thank you for taking time to visit with me today. Please don't hesitate to contact me if I can be of assistance to you.   Following are the goals we discussed today:   Goals Addressed             This Visit's Progress    Heart Failure Management       Patient Goals/Self Care Activities: -Patient/Caregiver will self-administer medications as prescribed as evidenced by self-report/primary caregiver report  -Patient/Caregiver will attend all scheduled provider appointments as evidenced by clinician review of documented attendance to scheduled appointments and patient/caregiver report -Patient/Caregiver will call provider office for new concerns or questions as evidenced by review of documented incoming telephone call notes and patient report  -Weigh daily and record (notify MD with 3 lb weight gain over night or 5 lb in a week) -Adhere to low sodium diet     Spoke with caregiver [name] she reports patient doing okay. ED visit recently for hallucinations.  Discussed counseling and possible neurology.  She states that she will just continue to monitor patient.  She reports legs are okay at this time. Patient not really following fluid restriction and diet.  Encouraged her to try to talk with patient about diet adherence.  She verbalized understanding.           Our next appointment is by telephone on 03/25/23 at 1030 am  Please call the care guide team at [phone] if you need to cancel or reschedule your appointment.   If you are experiencing a Mental Health or Behavioral Health Crisis or need someone to talk to, please call the Suicide and Crisis Lifeline: 988   The patient verbalized understanding of instructions, educational materials, and care plan provided today and DECLINED offer to receive copy of patient instructions, educational materials, and care plan.   The patient has been provided with contact information for the care management team and has been  advised to call with any health related questions or concerns.   Bary Leriche, RN, MSN [facility], [facility] Management Community Coordinator Direct Dial: [phone]  Fax: [phone] Website: [email or web address]

## 2023-03-10 ENCOUNTER — Encounter (INDEPENDENT_AMBULATORY_CARE_PROVIDER_SITE_OTHER): Payer: Medicare Other | Admitting: Ophthalmology

## 2023-03-10 DIAGNOSIS — H353231 Exudative age-related macular degeneration, bilateral, with active choroidal neovascularization: Secondary | ICD-10-CM

## 2023-03-10 DIAGNOSIS — I10 Essential (primary) hypertension: Secondary | ICD-10-CM

## 2023-03-10 DIAGNOSIS — H35033 Hypertensive retinopathy, bilateral: Secondary | ICD-10-CM | POA: Diagnosis not present

## 2023-03-10 DIAGNOSIS — H43813 Vitreous degeneration, bilateral: Secondary | ICD-10-CM | POA: Diagnosis not present

## 2023-03-15 ENCOUNTER — Other Ambulatory Visit: Payer: Self-pay | Admitting: Family Medicine

## 2023-03-25 ENCOUNTER — Ambulatory Visit: Payer: Self-pay

## 2023-03-25 NOTE — Patient Outreach (Signed)
  Care Coordination   03/25/2023 Name: MASANOBU COLAS MRN: [id number] DOB: 11/16/1933   Care Coordination Outreach Attempts:  An unsuccessful telephone outreach was attempted today to offer the patient information about available care coordination services.  Follow Up Plan:  Additional outreach attempts will be made to offer the patient care coordination information and services.   Encounter Outcome:  No Answer   Care Coordination Interventions:  No, not indicated    Bary Leriche, RN, MSN [facility]  [facility], [facility] Management Community Coordinator Direct Dial: [phone]  Fax: [phone] Website: [email or web address]

## 2023-03-26 ENCOUNTER — Telehealth: Payer: Self-pay

## 2023-03-26 NOTE — Patient Outreach (Signed)
Care Coordination   Follow Up Visit Note   03/26/2023 Name: KOHL LAUDERMAN MRN: [id number] DOB: 07/20/1933  DERRINGER NEWCOME is a 89 y.o. year old male who sees [name], [name], MD for primary care. I  spoke with Andrey Spearman by phone today.   What matters to the patients health and wellness today?  Maintain health    Goals Addressed             This Visit's Progress    Heart Failure Management       Patient Goals/Self Care Activities: -Patient/Caregiver will self-administer medications as prescribed as evidenced by self-report/primary caregiver report  -Patient/Caregiver will attend all scheduled provider appointments as evidenced by clinician review of documented attendance to scheduled appointments and patient/caregiver report -Patient/Caregiver will call provider office for new concerns or questions as evidenced by review of documented incoming telephone call notes and patient report  -Weigh daily and record (notify MD with 3 lb weight gain over night or 5 lb in a week) -Adhere to low sodium diet     Spoke with caregiver [name] she reports patient doing okay. Some memory loss continues.  Advised to discuss with PCP on next visit.  She just continues to monitor patient.  She reports legs are okay at this time. No redness or warmth. Patient not really following fluid restriction and diet.  Discussed heart failure management and when to notify physician.  She verbalized understanding.           SDOH assessments and interventions completed:  Yes     Care Coordination Interventions:  Yes, provided   Follow up plan: Follow up call scheduled for October    Encounter Outcome:  Patient Visit Completed   Bary Leriche, RN, MSN [facility]  [facility], [facility] Management Community Coordinator Direct Dial: [phone]  Fax: [phone] Website: [email or web address]

## 2023-03-26 NOTE — Patient Instructions (Signed)
Visit Information  Thank you for taking time to visit with me today. Please don't hesitate to contact me if I can be of assistance to you.   Following are the goals we discussed today:   Goals Addressed             This Visit's Progress    Heart Failure Management       Patient Goals/Self Care Activities: -Patient/Caregiver will self-administer medications as prescribed as evidenced by self-report/primary caregiver report  -Patient/Caregiver will attend all scheduled provider appointments as evidenced by clinician review of documented attendance to scheduled appointments and patient/caregiver report -Patient/Caregiver will call provider office for new concerns or questions as evidenced by review of documented incoming telephone call notes and patient report  -Weigh daily and record (notify MD with 3 lb weight gain over night or 5 lb in a week) -Adhere to low sodium diet     Spoke with caregiver [name] she reports patient doing okay. Some memory loss continues.  Advised to discuss with PCP on next visit.  She just continues to monitor patient.  She reports legs are okay at this time. No redness or warmth. Patient not really following fluid restriction and diet.  Discussed heart failure management and when to notify physician.  She verbalized understanding.           Our next appointment is by telephone on 04/22/23 at 1030 am  Please call the care guide team at [phone] if you need to cancel or reschedule your appointment.   If you are experiencing a Mental Health or Behavioral Health Crisis or need someone to talk to, please call the Suicide and Crisis Lifeline: 988   The patient verbalized understanding of instructions, educational materials, and care plan provided today and DECLINED offer to receive copy of patient instructions, educational materials, and care plan.   The patient has been provided with contact information for the care management team and has been advised to  call with any health related questions or concerns.   Bary Leriche, RN, MSN [facility], [facility] Management Community Coordinator Direct Dial: [phone]  Fax: [phone] Website: [email or web address]

## 2023-04-01 DIAGNOSIS — D1721 Benign lipomatous neoplasm of skin and subcutaneous tissue of right arm: Secondary | ICD-10-CM | POA: Diagnosis not present

## 2023-04-01 DIAGNOSIS — L570 Actinic keratosis: Secondary | ICD-10-CM | POA: Diagnosis not present

## 2023-04-01 DIAGNOSIS — D2371 Other benign neoplasm of skin of right lower limb, including hip: Secondary | ICD-10-CM | POA: Diagnosis not present

## 2023-04-01 DIAGNOSIS — X32XXXA Exposure to sunlight, initial encounter: Secondary | ICD-10-CM | POA: Diagnosis not present

## 2023-04-01 DIAGNOSIS — D045 Carcinoma in situ of skin of trunk: Secondary | ICD-10-CM | POA: Diagnosis not present

## 2023-04-13 ENCOUNTER — Telehealth: Payer: Self-pay | Admitting: *Deleted

## 2023-04-13 NOTE — Telephone Encounter (Signed)
Telephone encounter was:  Unsuccessful.  04/13/2023 Name: Hunter [name] MRN: [id number] DOB: 01/05/1934  Unsuccessful outbound call made today to assist with:  Transportation Needs   Outreach Attempt:  1st Attempt patient could not schedule transportation for his upcoming appt tried to inform but voicemail is full    Visual merchandiser [facility]  Population Health Careguide  Direct Dial: [phone] Website: [email or web address]

## 2023-04-14 ENCOUNTER — Telehealth: Payer: Self-pay | Admitting: *Deleted

## 2023-04-14 NOTE — Telephone Encounter (Signed)
Telephone encounter was:  Unsuccessful.  04/14/2023 Name: Hunter [name] MRN: [id number] DOB: [birth year]  Unsuccessful outbound call made today to assist with:  Transportation Needs   Outreach Attempt:  2nd Attempt  Cannot leave a message    [name] [facility]  Population Health Careguide  Direct Dial: [phone] Website: [email or web address]

## 2023-04-16 ENCOUNTER — Telehealth: Payer: Self-pay | Admitting: *Deleted

## 2023-04-16 ENCOUNTER — Encounter (INDEPENDENT_AMBULATORY_CARE_PROVIDER_SITE_OTHER): Payer: Medicare Other | Admitting: Ophthalmology

## 2023-04-16 DIAGNOSIS — H35033 Hypertensive retinopathy, bilateral: Secondary | ICD-10-CM | POA: Diagnosis not present

## 2023-04-16 DIAGNOSIS — H353231 Exudative age-related macular degeneration, bilateral, with active choroidal neovascularization: Secondary | ICD-10-CM | POA: Diagnosis not present

## 2023-04-16 DIAGNOSIS — H43813 Vitreous degeneration, bilateral: Secondary | ICD-10-CM | POA: Diagnosis not present

## 2023-04-16 DIAGNOSIS — I10 Essential (primary) hypertension: Secondary | ICD-10-CM | POA: Diagnosis not present

## 2023-04-16 NOTE — Telephone Encounter (Signed)
Telephone encounter was:  Successful.  04/16/2023 Name: KASTIN ZAMBO MRN: [id number] DOB: 1934/03/10  MARTAVIOUS MIERA is a 89 y.o. year old male who is a primary care patient of [name], [name], MD . The community resource team was consulted for assistance with Transportation Needs  Explained to patient they needed to address the Dr from the practice to see if they are able to assist with Mr Hoppers upcoming appts as THn transportation has ended and other than the community options which do not cross the county line there is no other service also advised that open enrollemnt for Medicare  advantage has started and that they should look into plans that might assist with transportation  Care guide performed the following interventions: Patient provided with information about care guide support team and interviewed to confirm resource needs.  Follow Up Plan:  No further follow up planned at this time. The patient has been provided with needed resources.  [name] [facility]  Population Health Careguide  Direct Dial: [phone] Website: [email or web address]

## 2023-04-21 ENCOUNTER — Ambulatory Visit (INDEPENDENT_AMBULATORY_CARE_PROVIDER_SITE_OTHER): Payer: Medicare Other

## 2023-04-21 ENCOUNTER — Encounter (INDEPENDENT_AMBULATORY_CARE_PROVIDER_SITE_OTHER): Payer: Medicare Other | Admitting: Ophthalmology

## 2023-04-21 DIAGNOSIS — H43813 Vitreous degeneration, bilateral: Secondary | ICD-10-CM

## 2023-04-21 DIAGNOSIS — H35033 Hypertensive retinopathy, bilateral: Secondary | ICD-10-CM

## 2023-04-21 DIAGNOSIS — H353231 Exudative age-related macular degeneration, bilateral, with active choroidal neovascularization: Secondary | ICD-10-CM | POA: Diagnosis not present

## 2023-04-21 DIAGNOSIS — I10 Essential (primary) hypertension: Secondary | ICD-10-CM

## 2023-04-21 DIAGNOSIS — I442 Atrioventricular block, complete: Secondary | ICD-10-CM

## 2023-04-21 LAB — CUP PACEART REMOTE DEVICE CHECK
Battery Remaining Longevity: 36 mo
Battery Remaining Percentage: 34 %
Battery Voltage: 2.95 V
Brady Statistic AP VP Percent: 59 %
Brady Statistic AP VS Percent: 1 %
Brady Statistic AS VP Percent: 36 %
Brady Statistic AS VS Percent: 1.2 %
Brady Statistic RA Percent Paced: 55 %
Brady Statistic RV Percent Paced: 95 %
Date Time Interrogation Session: 20241009020014
Implantable Lead Connection Status: 753985
Implantable Lead Connection Status: 753985
Implantable Lead Implant Date: 20180730
Implantable Lead Implant Date: 20180730
Implantable Lead Location: 753859
Implantable Lead Location: 753860
Implantable Lead Model: 5076
Implantable Lead Model: 5076
Implantable Pulse Generator Implant Date: 20180730
Lead Channel Impedance Value: 400 Ohm
Lead Channel Impedance Value: 630 Ohm
Lead Channel Pacing Threshold Amplitude: 0.5 V
Lead Channel Pacing Threshold Amplitude: 0.75 V
Lead Channel Pacing Threshold Pulse Width: 0.5 ms
Lead Channel Pacing Threshold Pulse Width: 0.5 ms
Lead Channel Sensing Intrinsic Amplitude: 2.7 mV
Lead Channel Sensing Intrinsic Amplitude: 8.8 mV
Lead Channel Setting Pacing Amplitude: 2 V
Lead Channel Setting Pacing Amplitude: 2.5 V
Lead Channel Setting Pacing Pulse Width: 0.5 ms
Lead Channel Setting Sensing Sensitivity: 4 mV
Pulse Gen Model: 2272
Pulse Gen Serial Number: 8930553

## 2023-04-22 ENCOUNTER — Ambulatory Visit: Payer: Self-pay

## 2023-04-22 NOTE — Patient Instructions (Signed)
Visit Information  Thank you for taking time to visit with me today. Please don't hesitate to contact me if I can be of assistance to you.   Following are the goals we discussed today:   Goals Addressed             This Visit's Progress    Heart Failure Management       Patient Goals/Self Care Activities: -Patient/Caregiver will self-administer medications as prescribed as evidenced by self-report/primary caregiver report  -Patient/Caregiver will attend all scheduled provider appointments as evidenced by clinician review of documented attendance to scheduled appointments and patient/caregiver report -Patient/Caregiver will call provider office for new concerns or questions as evidenced by review of documented incoming telephone call notes and patient report  -Weigh daily and record (notify MD with 3 lb weight gain over night or 5 lb in a week) -Adhere to low sodium diet     Spoke with caregiver [name] she reports patient doing okay. Getting ready for cataract surgery 04/29/23.  Memory loss continues.  She reports legs have some usual swelling but no redness, warmth or weeping.  Patient continues to not really follow fluid restriction and diet.  Discussed heart failure management and when to notify physician.  She is thinking patient may need a hospital bed as he has trouble getting out of bed and chair.  Advised that CM will send  message to Dr. Erasmo Leventhal team.  She verbalized understanding.           Our next appointment is by telephone on 05/27/23 at 1030 am  Please call the care guide team at [phone] if you need to cancel or reschedule your appointment.   If you are experiencing a Mental Health or Behavioral Health Crisis or need someone to talk to, please call the Suicide and Crisis Lifeline: 988   The patient verbalized understanding of instructions, educational materials, and care plan provided today and DECLINED offer to receive copy of patient instructions, educational  materials, and care plan.   The patient has been provided with contact information for the care management team and has been advised to call with any health related questions or concerns.   Bary Leriche, RN, MSN [facility], [facility] Management Community Coordinator Direct Dial: [phone]  Fax: [phone] Website: [email or web address]

## 2023-04-29 DIAGNOSIS — H2511 Age-related nuclear cataract, right eye: Secondary | ICD-10-CM | POA: Diagnosis not present

## 2023-05-11 NOTE — Progress Notes (Signed)
Remote pacemaker transmission.   

## 2023-05-21 ENCOUNTER — Encounter (INDEPENDENT_AMBULATORY_CARE_PROVIDER_SITE_OTHER): Payer: Medicare Other | Admitting: Ophthalmology

## 2023-05-21 DIAGNOSIS — H43813 Vitreous degeneration, bilateral: Secondary | ICD-10-CM

## 2023-05-21 DIAGNOSIS — H353231 Exudative age-related macular degeneration, bilateral, with active choroidal neovascularization: Secondary | ICD-10-CM | POA: Diagnosis not present

## 2023-05-21 DIAGNOSIS — H35033 Hypertensive retinopathy, bilateral: Secondary | ICD-10-CM | POA: Diagnosis not present

## 2023-05-21 DIAGNOSIS — I10 Essential (primary) hypertension: Secondary | ICD-10-CM

## 2023-05-27 ENCOUNTER — Ambulatory Visit: Payer: Self-pay

## 2023-05-27 NOTE — Patient Outreach (Signed)
  Care Coordination   05/27/2023 Name: Hunter [name] MRN: [id number] DOB: Jul 27, 1933   Care Coordination Outreach Attempts:  An unsuccessful telephone outreach was attempted for a scheduled appointment today.  Follow Up Plan:  Additional outreach attempts will be made to offer the patient care coordination information and services.   Encounter Outcome:  No Answer   Care Coordination Interventions:  No, not indicated    Bary Leriche, RN, MSN [facility]  [facility], [facility] Management Community Coordinator Direct Dial: [phone]  Fax: [phone] Website: [email or web address]

## 2023-05-31 ENCOUNTER — Ambulatory Visit: Payer: Medicare Other | Attending: Cardiology | Admitting: Cardiology

## 2023-05-31 ENCOUNTER — Encounter: Payer: Self-pay | Admitting: Cardiology

## 2023-05-31 VITALS — BP 138/62 | HR 75 | Ht 71.0 in | Wt 229.6 lb

## 2023-05-31 DIAGNOSIS — Z950 Presence of cardiac pacemaker: Secondary | ICD-10-CM | POA: Diagnosis not present

## 2023-05-31 DIAGNOSIS — I10 Essential (primary) hypertension: Secondary | ICD-10-CM

## 2023-05-31 DIAGNOSIS — Z79899 Other long term (current) drug therapy: Secondary | ICD-10-CM | POA: Diagnosis not present

## 2023-05-31 DIAGNOSIS — E782 Mixed hyperlipidemia: Secondary | ICD-10-CM

## 2023-05-31 DIAGNOSIS — I5032 Chronic diastolic (congestive) heart failure: Secondary | ICD-10-CM | POA: Diagnosis not present

## 2023-05-31 MED ORDER — FUROSEMIDE 80 MG PO TABS: 80.0000 mg | ORAL_TABLET | Freq: Every day | ORAL | 6 refills | Status: DC

## 2023-05-31 NOTE — Patient Instructions (Signed)
Medication Instructions:   Increase Lasix to 80mg  daily  Continue all other medications.     Labwork:  BMET, BNP, Mg - orders given today Please do in 2 weeks (around 06/14/2023) Office will contact with results via phone, letter or mychart.     Testing/Procedures:  none  Follow-Up:  3 months   Any Other Special Instructions Will Be Listed Below (If Applicable).   If you need a refill on your cardiac medications before your next appointment, please call your pharmacy.

## 2023-05-31 NOTE — Progress Notes (Signed)
Clinical Summary Hunter [name] is a 89 y.o.male seen today for follow up of the following medical problems.   1.HFpEF -05/2018 echo LVEF 50%, grade I dd 11/2022 echo: LVEF 50-55%, no WMAs, grade I dd, normal RV function  - some LE edema that is overall stable - takes lasix 60mg  daily - some SOB with just about any distance. Some SOB with laying flat.     2. Complete heart block/pacemaker - followed by EP 04/2023 normal device check - no recent symptoms     3. Chronic LBBB       4. CKD 3   5. HTN -he is compliant with meds     6. Hyperlipidemia - 06/2021 TC 116 HDL 31 TG 117 LDL 62  01/2023 TC 146 HDL 35 LDL 86 TG 158  7. Cognitive decline - ER visit 02/2023 with confusions, hallucinations.   Past Medical History:  Diagnosis Date   Allergic rhinitis 02/02/2007   Anemia    Arthritis    "knees" (02/08/2017)   Chronic lower back pain    CKD (chronic kidney disease), stage III (HCC) 05/27/2011   Complete heart block 02/08/2017   Eczema    Essential hypertension 02/02/2007   Lasix 60 mg, losartan 50mg ,  Amlodipine 5mg --> 2.5 mg.  Usually have to repeat BP measurements  In the past-Maxzide 37.5-25mg .   GERD (gastroesophageal reflux disease) occasional   Hearing loss of both ears    Heart failure with preserved ejection fraction (HFpEF) 12/06/2013   History of skin cancer 03/26/2014   nose    Hx of echocardiogram    Echo (07/2013): Mild LVH, EF 55-60%, grade 1 diastolic dysfunction, mild LAE, PASP 23   Hyperlipidemia    Hypertension    Hypothyroidism    LBBB (left bundle Hunter [name] block)    Left patella fracture 2018   "no OR" (02/08/2017)   Major depression in partial remission 02/02/2007   Amitriptyline 25mg  for sleep (stop when runs out of #10 04/2018(, zoloft 100-->150mg --> 200mg    Mild neurocognitive disorder 06/21/2019   Neuropathy (HCC) 11/02/2011   Nocturia    Peripheral vascular disease (HCC) LOWER EXTREMITIES   Spinal stenosis in cervical region 03/21/2010    Squamous cell skin cancer, penis: glans (HCC) 05/2010   Initial excision 11/11; recurrence, excision and laser Rx 9/13   Thrombocytopenia (HCC) 05/23/2012   Urinary frequency 09/05/2009   Possible BPH- see Artist Pais notes    Vitamin D deficiency 08/15/2008     No Known Allergies   Current Outpatient Medications  Medication Sig Dispense Refill   amLODipine (NORVASC) 2.5 MG tablet TAKE 1 TABLET BY MOUTH DAILY 30 tablet 3   aspirin 81 MG chewable tablet Chew 81 mg by mouth daily.      atorvastatin (LIPITOR) 20 MG tablet TAKE 1 TABLET BY MOUTH DAILY 90 tablet 3   Cyanocobalamin (B-12 PO) Take 1 tablet by mouth daily.     ferrous sulfate 325 (65 FE) MG tablet Take 325 mg by mouth daily with breakfast.     furosemide (LASIX) 20 MG tablet TAKE 3 TABLETS BY MOUTH DAILY 270 tablet 3   levothyroxine (SYNTHROID) 75 MCG tablet TAKE 1 TABLET BY MOUTH EVERY MORNING 90 tablet 1   losartan (COZAAR) 50 MG tablet TAKE 1 TABLET BY MOUTH DAILY 90 tablet 1   sertraline (ZOLOFT) 100 MG tablet TAKE 2 TABLETS BY MOUTH DAILY 180 tablet 3   Vitamin D, Ergocalciferol, (DRISDOL) 1.25 MG (50000 UNIT) CAPS capsule TAKE 1  CAPSULE BY MOUTH EVERY 7  DAYS 15 capsule 2   No current facility-administered medications for this visit.     Past Surgical History:  Procedure Laterality Date   CIRCUMCISION/ LASER DISSECTION PENILE GLANS CANCER  06-02-2010   CYSTOSCOPY WITH URETHRAL DILATATION  03/28/2012   Procedure: CYSTOSCOPY WITH URETHRAL DILATATION;  Surgeon: Kathi Ludwig, MD;  Location: [facility];  Service: Urology;  Laterality: N/A;  excision biopsy extensive meatal penile carcinoma meatoplasty   EXCISION RIGHT WRIST BENIGN TUMOR  2002 (APPROX)   hip surgery     INCISION AND DRAINAGE DEEP NECK ABSCESS     INGUINAL HERNIA REPAIR  1970's   KNEE ARTHROSCOPY Right 2017   LUMBAR LAMINECTOMY/DECOMPRESSION MICRODISCECTOMY N/A 05/24/2020   Procedure: OPEN LAMINECTOMY LUMBAR THREE-LUMBAR FOUR;  Surgeon:  Bethann Goo, DO;  Location: [facility] OR;  Service: Neurosurgery;  Laterality: N/A;   PACEMAKER IMPLANT N/A 02/08/2017   Procedure: Pacemaker Implant;  Surgeon: Duke Salvia, MD;  Location: [facility] INVASIVE CV LAB;  Service: Cardiovascular;  Laterality: N/A;   PACEMAKER IMPLANT  02/08/2017   SJM Assurity MRI dual chamber PPM implanted by Dr Graciela Husbands for complete heart block   PENILE BX  05-09-2010   TONSILLECTOMY     TRANSURETHRAL RESECTION OF PROSTATE N/A 05/10/2015   Procedure: CYSTOSCOPY, URETHRAL MEATAL DILATION, TRANSURETHRAL RESECTION OF THE PROSTATE (TURP);  Surgeon: Jethro Bolus, MD;  Location: [facility] ORS;  Service: Urology;  Laterality: N/A;     No Known Allergies    Family History  Problem Relation Age of Onset   Lung cancer Mother    Arthritis Other        family hx   Hyperlipidemia Other        family hx   Hypertension Other        family hx   Prostate cancer Other        family hx     Social History Mr. Coney reports that he has never smoked. He has never been exposed to tobacco smoke. He has never used smokeless tobacco. Mr. Gunnell reports current alcohol use.   Review of Systems CONSTITUTIONAL: No weight loss, fever, chills, weakness or fatigue.  HEENT: Eyes: No visual loss, blurred vision, double vision or yellow sclerae.No hearing loss, sneezing, congestion, runny nose or sore throat.  SKIN: No rash or itching.  CARDIOVASCULAR: per hpi RESPIRATORY: per hpi GASTROINTESTINAL: No anorexia, nausea, vomiting or diarrhea. No abdominal pain or blood.  GENITOURINARY: No burning on urination, no polyuria NEUROLOGICAL: No headache, dizziness, syncope, paralysis, ataxia, numbness or tingling in the extremities. No change in bowel or bladder control.  MUSCULOSKELETAL: No muscle, back pain, joint pain or stiffness.  LYMPHATICS: No enlarged nodes. No history of splenectomy.  PSYCHIATRIC: No history of depression or anxiety.  ENDOCRINOLOGIC: No reports of sweating, cold or  heat intolerance. No polyuria or polydipsia.  Marland Kitchen   Physical Examination Today's Vitals   05/31/23 1259  BP: 138/62  Pulse: 75  SpO2: 98%  Weight: 229 lb 9.6 oz (104.1 kg)  Height: 5\' 11"  (1.803 m)   Body mass index is 32.02 kg/m.  Gen: resting comfortably, no acute distress HEENT: no scleral icterus, pupils equal round and reactive, no palptable cervical adenopathy,  CV: RRR, no mrg, no jvd Resp: Clear to auscultation bilaterally GI: abdomen is soft, non-tender, non-distended, normal bowel sounds, no hepatosplenomegaly MSK: extremities are warm, 1+ bilateral LE edema Skin: warm, no rash Neuro:  no focal deficits Psych: appropriate affect  Diagnostic Studies  05/2018 echo Study Conclusions   - Left ventricle: The cavity size was normal. Wall thickness was    normal. The estimated ejection fraction was 50%. Septal motion    suggestive of LBBB or RV pacing. Doppler parameters are    consistent with abnormal left ventricular relaxation (grade 1    diastolic dysfunction).  - Aortic valve: Mildly calcified annulus. Trileaflet.  - Mitral valve: Mildly calcified annulus. There was mild    regurgitation.  - Right ventricle: Pacer wire or catheter noted in right ventricle.  - Right atrium: Central venous pressure (est): 3 mm Hg.  - Atrial septum: No defect or patent foramen ovale was identified.  - Tricuspid valve: There was trivial regurgitation.  - Pulmonary arteries: PA peak pressure: 24 mm Hg (S).  - Pericardium, extracardiac: There was no pericardial effusion.        Assessment and Plan   1.HFpEF - some signs of fluid overload, increase lasix to 80mg  daily - in 2 weeks check bmet/mg/bnp - advanced age, some cognitiive decline. I think overall low benefit to consider SGLT2i, will not start at this time.   2. HTN - at goal, continue current med  3. HLD - LDL at goal, continue current meds  4. Pacemaker - normal function on recent check, no symptoms - continue  to monitor   F/u 3 months     Antoine Poche, M.D.

## 2023-05-31 NOTE — Addendum Note (Signed)
Addended by: Lesle Chris on: 05/31/2023 01:31 PM   Modules accepted: Orders

## 2023-06-01 ENCOUNTER — Telehealth: Payer: Self-pay

## 2023-06-01 NOTE — Patient Instructions (Signed)
Visit Information  Thank you for taking time to visit with me today. Please don't hesitate to contact me if I can be of assistance to you.   Following are the goals we discussed today:   Goals Addressed             This Visit's Progress    Heart Failure Management       Patient Goals/Self Care Activities: -Patient/Caregiver will self-administer medications as prescribed as evidenced by self-report/primary caregiver report  -Patient/Caregiver will attend all scheduled provider appointments as evidenced by clinician review of documented attendance to scheduled appointments and patient/caregiver report -Patient/Caregiver will call provider office for new concerns or questions as evidenced by review of documented incoming telephone call notes and patient report  -Weigh daily and record (notify MD with 3 lb weight gain over night or 5 lb in a week) -Adhere to low sodium diet     Spoke with caregiver [name] she reports patient doing okay. He had his cataract surgery and is doing good with that.  Saw cardiology and visit went well.  Increase in fluid pill to assist with swelling.  PCP visit next week for follow up. Discussed heart failure and management.  She reports dementia is slowly progressing but manageable.  Discussed need for increased level of care if necessary.  She verbalized understanding.           Our next appointment is by telephone on 06/29/23 at 1030 am  Please call the care guide team at [phone] if you need to cancel or reschedule your appointment.   If you are experiencing a Mental Health or Behavioral Health Crisis or need someone to talk to, please call the Suicide and Crisis Lifeline: 988   The patient verbalized understanding of instructions, educational materials, and care plan provided today and DECLINED offer to receive copy of patient instructions, educational materials, and care plan.   The patient has been provided with contact information for the care  management team and has been advised to call with any health related questions or concerns.   Bary Leriche, RN, MSN [facility], [facility] Management Community Coordinator Direct Dial: [phone]  Fax: [phone] Website: [email or web address]

## 2023-06-01 NOTE — Patient Outreach (Signed)
  Care Coordination   Follow Up Visit Note   06/01/2023 Name: JDEN DURST MRN: [id number] DOB: [birth year]  MARKIS HOWERY is a 89 y.o. year old male who sees [name], [name], MD for primary care. I  spoke with Andrey Spearman by phone today.  What matters to the patients health and wellness today?  Maintaining health    Goals Addressed             This Visit's Progress    Heart Failure Management       Patient Goals/Self Care Activities: -Patient/Caregiver will self-administer medications as prescribed as evidenced by self-report/primary caregiver report  -Patient/Caregiver will attend all scheduled provider appointments as evidenced by clinician review of documented attendance to scheduled appointments and patient/caregiver report -Patient/Caregiver will call provider office for new concerns or questions as evidenced by review of documented incoming telephone call notes and patient report  -Weigh daily and record (notify MD with 3 lb weight gain over night or 5 lb in a week) -Adhere to low sodium diet     Spoke with caregiver [name] she reports patient doing okay. He had his cataract surgery and is doing good with that.  Saw cardiology and visit went well.  Increase in fluid pill to assist with swelling.  PCP visit next week for follow up. Discussed heart failure and management.  She reports dementia is slowly progressing but manageable.  Discussed need for increased level of care if necessary.  She verbalized understanding.           SDOH assessments and interventions completed:  Yes     Care Coordination Interventions:  Yes, provided   Follow up plan: Follow up call scheduled for December    Encounter Outcome:  Patient Visit Completed   Bary Leriche, RN, MSN [place]  [facility], [facility] Management Community Coordinator Direct Dial: [phone]  Fax: [phone] Website: [email or web address]

## 2023-06-06 DIAGNOSIS — Z743 Need for continuous supervision: Secondary | ICD-10-CM | POA: Diagnosis not present

## 2023-06-06 DIAGNOSIS — I110 Hypertensive heart disease with heart failure: Secondary | ICD-10-CM | POA: Diagnosis not present

## 2023-06-06 DIAGNOSIS — I130 Hypertensive heart and chronic kidney disease with heart failure and stage 1 through stage 4 chronic kidney disease, or unspecified chronic kidney disease: Secondary | ICD-10-CM | POA: Diagnosis not present

## 2023-06-06 DIAGNOSIS — I129 Hypertensive chronic kidney disease with stage 1 through stage 4 chronic kidney disease, or unspecified chronic kidney disease: Secondary | ICD-10-CM | POA: Diagnosis not present

## 2023-06-06 DIAGNOSIS — I509 Heart failure, unspecified: Secondary | ICD-10-CM | POA: Diagnosis not present

## 2023-06-06 DIAGNOSIS — Z7982 Long term (current) use of aspirin: Secondary | ICD-10-CM | POA: Diagnosis not present

## 2023-06-06 DIAGNOSIS — R6889 Other general symptoms and signs: Secondary | ICD-10-CM | POA: Diagnosis not present

## 2023-06-06 DIAGNOSIS — Z79899 Other long term (current) drug therapy: Secondary | ICD-10-CM | POA: Diagnosis not present

## 2023-06-06 DIAGNOSIS — R404 Transient alteration of awareness: Secondary | ICD-10-CM | POA: Diagnosis not present

## 2023-06-06 DIAGNOSIS — Z8616 Personal history of COVID-19: Secondary | ICD-10-CM | POA: Diagnosis not present

## 2023-06-06 DIAGNOSIS — R410 Disorientation, unspecified: Secondary | ICD-10-CM | POA: Diagnosis not present

## 2023-06-06 DIAGNOSIS — N183 Chronic kidney disease, stage 3 unspecified: Secondary | ICD-10-CM | POA: Diagnosis not present

## 2023-06-06 DIAGNOSIS — N189 Chronic kidney disease, unspecified: Secondary | ICD-10-CM | POA: Diagnosis not present

## 2023-06-06 DIAGNOSIS — N179 Acute kidney failure, unspecified: Secondary | ICD-10-CM | POA: Diagnosis not present

## 2023-06-06 DIAGNOSIS — Z8679 Personal history of other diseases of the circulatory system: Secondary | ICD-10-CM | POA: Diagnosis not present

## 2023-06-06 DIAGNOSIS — I5032 Chronic diastolic (congestive) heart failure: Secondary | ICD-10-CM | POA: Diagnosis not present

## 2023-06-06 DIAGNOSIS — R9082 White matter disease, unspecified: Secondary | ICD-10-CM | POA: Diagnosis not present

## 2023-06-06 DIAGNOSIS — J811 Chronic pulmonary edema: Secondary | ICD-10-CM | POA: Diagnosis not present

## 2023-06-06 DIAGNOSIS — I2510 Atherosclerotic heart disease of native coronary artery without angina pectoris: Secondary | ICD-10-CM | POA: Diagnosis not present

## 2023-06-06 DIAGNOSIS — I5030 Unspecified diastolic (congestive) heart failure: Secondary | ICD-10-CM | POA: Diagnosis not present

## 2023-06-06 DIAGNOSIS — Z87891 Personal history of nicotine dependence: Secondary | ICD-10-CM | POA: Diagnosis not present

## 2023-06-06 DIAGNOSIS — Z792 Long term (current) use of antibiotics: Secondary | ICD-10-CM | POA: Diagnosis not present

## 2023-06-06 DIAGNOSIS — Z950 Presence of cardiac pacemaker: Secondary | ICD-10-CM | POA: Diagnosis not present

## 2023-06-07 DIAGNOSIS — I129 Hypertensive chronic kidney disease with stage 1 through stage 4 chronic kidney disease, or unspecified chronic kidney disease: Secondary | ICD-10-CM | POA: Diagnosis not present

## 2023-06-07 DIAGNOSIS — I2510 Atherosclerotic heart disease of native coronary artery without angina pectoris: Secondary | ICD-10-CM | POA: Diagnosis not present

## 2023-06-07 DIAGNOSIS — Z8679 Personal history of other diseases of the circulatory system: Secondary | ICD-10-CM | POA: Diagnosis not present

## 2023-06-07 DIAGNOSIS — N189 Chronic kidney disease, unspecified: Secondary | ICD-10-CM | POA: Diagnosis not present

## 2023-06-07 DIAGNOSIS — Z79899 Other long term (current) drug therapy: Secondary | ICD-10-CM | POA: Diagnosis not present

## 2023-06-07 DIAGNOSIS — N179 Acute kidney failure, unspecified: Secondary | ICD-10-CM | POA: Diagnosis not present

## 2023-06-08 ENCOUNTER — Telehealth: Payer: Self-pay | Admitting: Family Medicine

## 2023-06-08 ENCOUNTER — Ambulatory Visit: Payer: Medicare Other | Admitting: Family Medicine

## 2023-06-08 NOTE — Telephone Encounter (Signed)
See below

## 2023-06-08 NOTE — Telephone Encounter (Signed)
I can do this but patient will need a face-to-face visit within 30 days with me-looks like is scheduled with Stephanie-any cancellations that we can move him to my schedule?

## 2023-06-08 NOTE — Telephone Encounter (Signed)
Caller is Hilbert Corrigan, Account executive from [facility]. Caller states she needs to confirm if PCP will follow for hospital orders for nursing, PT, and OT home health. Caller can be reached at [phone] for confirmation.

## 2023-06-09 ENCOUNTER — Telehealth: Payer: Self-pay | Admitting: *Deleted

## 2023-06-09 NOTE — Transitions of Care (Post Inpatient/ED Visit) (Signed)
06/09/2023  Name: Hunter [name] MRN: [id number] DOB: 22-Mar-1934  Today's TOC FU Call Status: Today's TOC FU Call Status:: Successful TOC FU Call Completed TOC FU Call Complete Date: 06/09/23 Patient's Name and Date of Birth confirmed.  Transition Care Management Follow-up Telephone Call Date of Discharge: 06/08/23 Discharge Facility: Other [facility]) Name of Other (Non[facility]) Discharge Facility: [facility] Type of Discharge: Inpatient Admission Primary Inpatient Discharge Diagnosis:: Altered mental Status How have you been since you were released from the hospital?: Better Any questions or concerns?: Yes Patient Questions/Concerns:: Patient friend want to know whent the [facility] agency was coming Patient Questions/Concerns Addressed: Other: (RN gave [name] the number so she could follow up with them)  Items Reviewed: Did you receive and understand the discharge instructions provided?: No (She actually had not went over them. RN went over them) Medications obtained,verified, and reconciled?: Yes (Medications Reviewed) Any new allergies since your discharge?: No Dietary orders reviewed?: Yes Type of Diet Ordered:: Heart healthy low sodium. (RN gave [name] the information to start the [facility] meal plan) Do you have support at home?: Yes People in Home: alone Name of Support/Comfort Primary Source: [name] friend  Medications Reviewed Today: Medications Reviewed Today     Reviewed by Luella Cook, RN (Case Manager) on 06/09/23 at 1256  Med List Status: <None>   Medication Order Taking? Sig Documenting Provider Last Dose Status Informant  amLODipine (NORVASC) 2.5 MG tablet [id number] Yes TAKE 1 TABLET BY MOUTH DAILY Shelva Majestic, MD Taking Active   aspirin 81 MG chewable tablet [id number] Yes Chew 81 mg by mouth daily.  [provider] Taking Active Friend, Care Giver, Pharmacy Records  atorvastatin (LIPITOR) 20 MG tablet [id number] Yes TAKE 1 TABLET BY MOUTH DAILY  Shelva Majestic, MD Taking Active Friend, Care Giver, Pharmacy Records  Cyanocobalamin (B-12 PO) [id number]  Take 1 tablet by mouth daily.  Patient not taking: Reported on 05/31/2023   [provider]  Active Friend, Care Giver, Pharmacy Records  ferrous sulfate 325 (65 FE) MG tablet [id number] Yes Take 325 mg by mouth daily with breakfast. [provider] Taking Active Friend, Care Giver, Pharmacy Records  furosemide (LASIX) 80 MG tablet [id number] Yes Take 1 tablet (80 mg total) by mouth daily. Antoine Poche, MD Taking Active   levothyroxine (SYNTHROID) 75 MCG tablet [id number] Yes TAKE 1 TABLET BY MOUTH EVERY MORNING [name] [name], MD Taking Active Friend, Care Giver, Pharmacy Records  losartan (COZAAR) 50 MG tablet [id number] Yes TAKE 1 TABLET BY MOUTH DAILY Shelva Majestic, MD Taking Active Friend, Care Giver, Pharmacy Records  sertraline (ZOLOFT) 100 MG tablet [id number] Yes TAKE 2 TABLETS BY MOUTH DAILY Shelva Majestic, MD Taking Active Friend, Care Giver, Pharmacy Records  Vitamin D, Ergocalciferol, (DRISDOL) 1.25 MG (50000 UNIT) CAPS capsule [id number]  TAKE 1 CAPSULE BY MOUTH EVERY 7  DAYS  Patient not taking: Reported on 05/31/2023   Shelva Majestic, MD  Active Friend, Care Giver, Pharmacy Records            Home Care and Equipment/Supplies: Were Home Health Services Ordered?: Yes Name of Home Health Agency:: [facility] Has Agency set up a time to come to your home?: No EMR reviewed for Home Health Orders: Orders present/patient has not received call (refer to CM for follow-up) (RN gave [name] phone number to follow up) Any new equipment or medical supplies ordered?: NA  Functional Questionnaire: Do you need assistance with bathing/showering or dressing?: No Do you  need assistance with meal preparation?: Yes Do you need assistance with eating?: No Do you have difficulty maintaining continence: No Do you need assistance with getting out of bed/getting  out of a chair/moving?: No Do you have difficulty managing or taking your medications?: Yes  Follow up appointments reviewed: PCP Follow-up appointment confirmed?: Yes Date of PCP follow-up appointment?: 06/15/23 Follow-up Provider: Dr [facility] Follow-up appointment confirmed?: Yes Date of Specialist follow-up appointment?: 06/25/23 Follow-Up Specialty Provider:: Dr Ashley Royalty Do you need transportation to your follow-up appointment?: Yes Transportation Need Intervention Addressed By:: Other: (Patient calls private services to arrange transportation. Per [name] the [facility] transportation has d/cd services due to lack of funding.) Do you understand care options if your condition(s) worsen?: Yes-patient verbalized understanding  SDOH Interventions Today    Flowsheet Row Most Recent Value  SDOH Interventions   Food Insecurity Interventions Intervention Not Indicated  Housing Interventions Intervention Not Indicated  Transportation Interventions Intervention Not Indicated, Other (Comment)  [Per patient friend they have to pay private services because [facility] benefit for transportation was stopped due to funding for this year]     Caregiver refused weekly follow up calls since [facility] Coordinator is outreaching 06/29/2023 at 10:30  Gean Maidens BSN RN Population Health- Transition of Care Team.  [facility] [phone]

## 2023-06-09 NOTE — Transitions of Care (Post Inpatient/ED Visit) (Signed)
   06/09/2023  Name: Hunter [name] MRN: [id number] DOB: [birth year]  Today's TOC FU Call Status: Today's TOC FU Call Status:: Unsuccessful Call (1st Attempt) Unsuccessful Call (1st Attempt) Date: 06/09/23  Attempted to reach the patient regarding the most recent Inpatient/ED visit.  Follow Up Plan: Additional outreach attempts will be made to reach the patient to complete the Transitions of Care (Post Inpatient/ED visit) call.   Gean Maidens BSN RN Population Health- Transition of Care Team.  [facility] [phone]

## 2023-06-09 NOTE — Telephone Encounter (Signed)
See below

## 2023-06-12 DIAGNOSIS — J9811 Atelectasis: Secondary | ICD-10-CM | POA: Diagnosis not present

## 2023-06-12 DIAGNOSIS — I130 Hypertensive heart and chronic kidney disease with heart failure and stage 1 through stage 4 chronic kidney disease, or unspecified chronic kidney disease: Secondary | ICD-10-CM | POA: Diagnosis not present

## 2023-06-12 DIAGNOSIS — R0781 Pleurodynia: Secondary | ICD-10-CM | POA: Diagnosis not present

## 2023-06-12 DIAGNOSIS — Z743 Need for continuous supervision: Secondary | ICD-10-CM | POA: Diagnosis not present

## 2023-06-12 DIAGNOSIS — N1832 Chronic kidney disease, stage 3b: Secondary | ICD-10-CM | POA: Diagnosis not present

## 2023-06-12 DIAGNOSIS — W19XXXA Unspecified fall, initial encounter: Secondary | ICD-10-CM | POA: Diagnosis not present

## 2023-06-12 DIAGNOSIS — Z87891 Personal history of nicotine dependence: Secondary | ICD-10-CM | POA: Diagnosis not present

## 2023-06-12 DIAGNOSIS — I2510 Atherosclerotic heart disease of native coronary artery without angina pectoris: Secondary | ICD-10-CM | POA: Diagnosis not present

## 2023-06-12 DIAGNOSIS — I499 Cardiac arrhythmia, unspecified: Secondary | ICD-10-CM | POA: Diagnosis not present

## 2023-06-12 DIAGNOSIS — Z7982 Long term (current) use of aspirin: Secondary | ICD-10-CM | POA: Diagnosis not present

## 2023-06-12 DIAGNOSIS — E039 Hypothyroidism, unspecified: Secondary | ICD-10-CM | POA: Diagnosis not present

## 2023-06-12 DIAGNOSIS — I5032 Chronic diastolic (congestive) heart failure: Secondary | ICD-10-CM | POA: Diagnosis not present

## 2023-06-12 DIAGNOSIS — R627 Adult failure to thrive: Secondary | ICD-10-CM | POA: Diagnosis not present

## 2023-06-13 DIAGNOSIS — N1832 Chronic kidney disease, stage 3b: Secondary | ICD-10-CM | POA: Diagnosis not present

## 2023-06-13 DIAGNOSIS — R627 Adult failure to thrive: Secondary | ICD-10-CM | POA: Diagnosis not present

## 2023-06-13 DIAGNOSIS — Z7982 Long term (current) use of aspirin: Secondary | ICD-10-CM | POA: Diagnosis not present

## 2023-06-13 DIAGNOSIS — I5032 Chronic diastolic (congestive) heart failure: Secondary | ICD-10-CM | POA: Diagnosis not present

## 2023-06-13 DIAGNOSIS — R531 Weakness: Secondary | ICD-10-CM | POA: Diagnosis not present

## 2023-06-13 DIAGNOSIS — I130 Hypertensive heart and chronic kidney disease with heart failure and stage 1 through stage 4 chronic kidney disease, or unspecified chronic kidney disease: Secondary | ICD-10-CM | POA: Diagnosis not present

## 2023-06-13 DIAGNOSIS — E039 Hypothyroidism, unspecified: Secondary | ICD-10-CM | POA: Diagnosis not present

## 2023-06-13 DIAGNOSIS — I2510 Atherosclerotic heart disease of native coronary artery without angina pectoris: Secondary | ICD-10-CM | POA: Diagnosis not present

## 2023-06-13 DIAGNOSIS — Z87891 Personal history of nicotine dependence: Secondary | ICD-10-CM | POA: Diagnosis not present

## 2023-06-14 DIAGNOSIS — Z87891 Personal history of nicotine dependence: Secondary | ICD-10-CM | POA: Diagnosis not present

## 2023-06-14 DIAGNOSIS — E039 Hypothyroidism, unspecified: Secondary | ICD-10-CM | POA: Diagnosis not present

## 2023-06-14 DIAGNOSIS — I2510 Atherosclerotic heart disease of native coronary artery without angina pectoris: Secondary | ICD-10-CM | POA: Diagnosis not present

## 2023-06-14 DIAGNOSIS — I130 Hypertensive heart and chronic kidney disease with heart failure and stage 1 through stage 4 chronic kidney disease, or unspecified chronic kidney disease: Secondary | ICD-10-CM | POA: Diagnosis not present

## 2023-06-14 DIAGNOSIS — R54 Age-related physical debility: Secondary | ICD-10-CM | POA: Diagnosis not present

## 2023-06-14 DIAGNOSIS — N189 Chronic kidney disease, unspecified: Secondary | ICD-10-CM | POA: Diagnosis not present

## 2023-06-14 DIAGNOSIS — Z7902 Long term (current) use of antithrombotics/antiplatelets: Secondary | ICD-10-CM | POA: Diagnosis not present

## 2023-06-14 DIAGNOSIS — Z79899 Other long term (current) drug therapy: Secondary | ICD-10-CM | POA: Diagnosis not present

## 2023-06-14 DIAGNOSIS — R627 Adult failure to thrive: Secondary | ICD-10-CM | POA: Diagnosis not present

## 2023-06-14 DIAGNOSIS — N1832 Chronic kidney disease, stage 3b: Secondary | ICD-10-CM | POA: Diagnosis not present

## 2023-06-14 DIAGNOSIS — Z7982 Long term (current) use of aspirin: Secondary | ICD-10-CM | POA: Diagnosis not present

## 2023-06-14 DIAGNOSIS — I5032 Chronic diastolic (congestive) heart failure: Secondary | ICD-10-CM | POA: Diagnosis not present

## 2023-06-15 ENCOUNTER — Inpatient Hospital Stay: Payer: Medicare Other | Admitting: Family

## 2023-06-15 DIAGNOSIS — N1832 Chronic kidney disease, stage 3b: Secondary | ICD-10-CM | POA: Diagnosis not present

## 2023-06-15 DIAGNOSIS — E039 Hypothyroidism, unspecified: Secondary | ICD-10-CM | POA: Diagnosis not present

## 2023-06-15 DIAGNOSIS — I130 Hypertensive heart and chronic kidney disease with heart failure and stage 1 through stage 4 chronic kidney disease, or unspecified chronic kidney disease: Secondary | ICD-10-CM | POA: Diagnosis not present

## 2023-06-15 DIAGNOSIS — Z79899 Other long term (current) drug therapy: Secondary | ICD-10-CM | POA: Diagnosis not present

## 2023-06-15 DIAGNOSIS — Z7982 Long term (current) use of aspirin: Secondary | ICD-10-CM | POA: Diagnosis not present

## 2023-06-15 DIAGNOSIS — Z87891 Personal history of nicotine dependence: Secondary | ICD-10-CM | POA: Diagnosis not present

## 2023-06-15 DIAGNOSIS — R627 Adult failure to thrive: Secondary | ICD-10-CM | POA: Diagnosis not present

## 2023-06-15 DIAGNOSIS — I2510 Atherosclerotic heart disease of native coronary artery without angina pectoris: Secondary | ICD-10-CM | POA: Diagnosis not present

## 2023-06-15 DIAGNOSIS — I5032 Chronic diastolic (congestive) heart failure: Secondary | ICD-10-CM | POA: Diagnosis not present

## 2023-06-15 NOTE — Telephone Encounter (Signed)
FYI  Pt initially had HFU scheduled with PCP for 06/15/23 but it was canceled on 06/14/23 due to pt being hospitalized.

## 2023-06-16 DIAGNOSIS — Z7902 Long term (current) use of antithrombotics/antiplatelets: Secondary | ICD-10-CM | POA: Diagnosis not present

## 2023-06-16 DIAGNOSIS — E039 Hypothyroidism, unspecified: Secondary | ICD-10-CM | POA: Diagnosis not present

## 2023-06-16 DIAGNOSIS — I2510 Atherosclerotic heart disease of native coronary artery without angina pectoris: Secondary | ICD-10-CM | POA: Diagnosis not present

## 2023-06-16 DIAGNOSIS — I5032 Chronic diastolic (congestive) heart failure: Secondary | ICD-10-CM | POA: Diagnosis not present

## 2023-06-16 DIAGNOSIS — N1832 Chronic kidney disease, stage 3b: Secondary | ICD-10-CM | POA: Diagnosis not present

## 2023-06-16 DIAGNOSIS — R627 Adult failure to thrive: Secondary | ICD-10-CM | POA: Diagnosis not present

## 2023-06-16 DIAGNOSIS — Z7982 Long term (current) use of aspirin: Secondary | ICD-10-CM | POA: Diagnosis not present

## 2023-06-16 DIAGNOSIS — I130 Hypertensive heart and chronic kidney disease with heart failure and stage 1 through stage 4 chronic kidney disease, or unspecified chronic kidney disease: Secondary | ICD-10-CM | POA: Diagnosis not present

## 2023-06-17 DIAGNOSIS — Z87891 Personal history of nicotine dependence: Secondary | ICD-10-CM | POA: Diagnosis not present

## 2023-06-17 DIAGNOSIS — N1832 Chronic kidney disease, stage 3b: Secondary | ICD-10-CM | POA: Diagnosis not present

## 2023-06-17 DIAGNOSIS — Z79899 Other long term (current) drug therapy: Secondary | ICD-10-CM | POA: Diagnosis not present

## 2023-06-17 DIAGNOSIS — E039 Hypothyroidism, unspecified: Secondary | ICD-10-CM | POA: Diagnosis not present

## 2023-06-17 DIAGNOSIS — I2510 Atherosclerotic heart disease of native coronary artery without angina pectoris: Secondary | ICD-10-CM | POA: Diagnosis not present

## 2023-06-17 DIAGNOSIS — I5032 Chronic diastolic (congestive) heart failure: Secondary | ICD-10-CM | POA: Diagnosis not present

## 2023-06-17 DIAGNOSIS — Z7982 Long term (current) use of aspirin: Secondary | ICD-10-CM | POA: Diagnosis not present

## 2023-06-17 DIAGNOSIS — R54 Age-related physical debility: Secondary | ICD-10-CM | POA: Diagnosis not present

## 2023-06-17 DIAGNOSIS — R627 Adult failure to thrive: Secondary | ICD-10-CM | POA: Diagnosis not present

## 2023-06-17 DIAGNOSIS — I130 Hypertensive heart and chronic kidney disease with heart failure and stage 1 through stage 4 chronic kidney disease, or unspecified chronic kidney disease: Secondary | ICD-10-CM | POA: Diagnosis not present

## 2023-06-18 DIAGNOSIS — E039 Hypothyroidism, unspecified: Secondary | ICD-10-CM | POA: Diagnosis not present

## 2023-06-18 DIAGNOSIS — Z7982 Long term (current) use of aspirin: Secondary | ICD-10-CM | POA: Diagnosis not present

## 2023-06-18 DIAGNOSIS — I130 Hypertensive heart and chronic kidney disease with heart failure and stage 1 through stage 4 chronic kidney disease, or unspecified chronic kidney disease: Secondary | ICD-10-CM | POA: Diagnosis not present

## 2023-06-18 DIAGNOSIS — R627 Adult failure to thrive: Secondary | ICD-10-CM | POA: Diagnosis not present

## 2023-06-18 DIAGNOSIS — I5032 Chronic diastolic (congestive) heart failure: Secondary | ICD-10-CM | POA: Diagnosis not present

## 2023-06-18 DIAGNOSIS — N1832 Chronic kidney disease, stage 3b: Secondary | ICD-10-CM | POA: Diagnosis not present

## 2023-06-18 DIAGNOSIS — I2510 Atherosclerotic heart disease of native coronary artery without angina pectoris: Secondary | ICD-10-CM | POA: Diagnosis not present

## 2023-06-18 DIAGNOSIS — Z87891 Personal history of nicotine dependence: Secondary | ICD-10-CM | POA: Diagnosis not present

## 2023-06-19 DIAGNOSIS — I5032 Chronic diastolic (congestive) heart failure: Secondary | ICD-10-CM | POA: Diagnosis not present

## 2023-06-19 DIAGNOSIS — E039 Hypothyroidism, unspecified: Secondary | ICD-10-CM | POA: Diagnosis not present

## 2023-06-19 DIAGNOSIS — R627 Adult failure to thrive: Secondary | ICD-10-CM | POA: Diagnosis not present

## 2023-06-19 DIAGNOSIS — N1832 Chronic kidney disease, stage 3b: Secondary | ICD-10-CM | POA: Diagnosis not present

## 2023-06-19 DIAGNOSIS — Z87891 Personal history of nicotine dependence: Secondary | ICD-10-CM | POA: Diagnosis not present

## 2023-06-19 DIAGNOSIS — I129 Hypertensive chronic kidney disease with stage 1 through stage 4 chronic kidney disease, or unspecified chronic kidney disease: Secondary | ICD-10-CM | POA: Diagnosis not present

## 2023-06-19 DIAGNOSIS — I130 Hypertensive heart and chronic kidney disease with heart failure and stage 1 through stage 4 chronic kidney disease, or unspecified chronic kidney disease: Secondary | ICD-10-CM | POA: Diagnosis not present

## 2023-06-19 DIAGNOSIS — Z7982 Long term (current) use of aspirin: Secondary | ICD-10-CM | POA: Diagnosis not present

## 2023-06-19 DIAGNOSIS — I2510 Atherosclerotic heart disease of native coronary artery without angina pectoris: Secondary | ICD-10-CM | POA: Diagnosis not present

## 2023-06-20 DIAGNOSIS — Z7982 Long term (current) use of aspirin: Secondary | ICD-10-CM | POA: Diagnosis not present

## 2023-06-20 DIAGNOSIS — E039 Hypothyroidism, unspecified: Secondary | ICD-10-CM | POA: Diagnosis not present

## 2023-06-20 DIAGNOSIS — R627 Adult failure to thrive: Secondary | ICD-10-CM | POA: Diagnosis not present

## 2023-06-20 DIAGNOSIS — Z87891 Personal history of nicotine dependence: Secondary | ICD-10-CM | POA: Diagnosis not present

## 2023-06-20 DIAGNOSIS — I5032 Chronic diastolic (congestive) heart failure: Secondary | ICD-10-CM | POA: Diagnosis not present

## 2023-06-20 DIAGNOSIS — N1832 Chronic kidney disease, stage 3b: Secondary | ICD-10-CM | POA: Diagnosis not present

## 2023-06-20 DIAGNOSIS — I2510 Atherosclerotic heart disease of native coronary artery without angina pectoris: Secondary | ICD-10-CM | POA: Diagnosis not present

## 2023-06-20 DIAGNOSIS — I130 Hypertensive heart and chronic kidney disease with heart failure and stage 1 through stage 4 chronic kidney disease, or unspecified chronic kidney disease: Secondary | ICD-10-CM | POA: Diagnosis not present

## 2023-06-21 DIAGNOSIS — R627 Adult failure to thrive: Secondary | ICD-10-CM | POA: Diagnosis not present

## 2023-06-21 DIAGNOSIS — I5032 Chronic diastolic (congestive) heart failure: Secondary | ICD-10-CM | POA: Diagnosis not present

## 2023-06-21 DIAGNOSIS — I2510 Atherosclerotic heart disease of native coronary artery without angina pectoris: Secondary | ICD-10-CM | POA: Diagnosis not present

## 2023-06-21 DIAGNOSIS — Z87891 Personal history of nicotine dependence: Secondary | ICD-10-CM | POA: Diagnosis not present

## 2023-06-21 DIAGNOSIS — N1832 Chronic kidney disease, stage 3b: Secondary | ICD-10-CM | POA: Diagnosis not present

## 2023-06-21 DIAGNOSIS — E039 Hypothyroidism, unspecified: Secondary | ICD-10-CM | POA: Diagnosis not present

## 2023-06-21 DIAGNOSIS — Z7982 Long term (current) use of aspirin: Secondary | ICD-10-CM | POA: Diagnosis not present

## 2023-06-21 DIAGNOSIS — Z79899 Other long term (current) drug therapy: Secondary | ICD-10-CM | POA: Diagnosis not present

## 2023-06-21 DIAGNOSIS — I130 Hypertensive heart and chronic kidney disease with heart failure and stage 1 through stage 4 chronic kidney disease, or unspecified chronic kidney disease: Secondary | ICD-10-CM | POA: Diagnosis not present

## 2023-06-22 DIAGNOSIS — Z7982 Long term (current) use of aspirin: Secondary | ICD-10-CM | POA: Diagnosis not present

## 2023-06-22 DIAGNOSIS — R627 Adult failure to thrive: Secondary | ICD-10-CM | POA: Diagnosis not present

## 2023-06-22 DIAGNOSIS — N1832 Chronic kidney disease, stage 3b: Secondary | ICD-10-CM | POA: Diagnosis not present

## 2023-06-22 DIAGNOSIS — I5030 Unspecified diastolic (congestive) heart failure: Secondary | ICD-10-CM | POA: Diagnosis not present

## 2023-06-22 DIAGNOSIS — I130 Hypertensive heart and chronic kidney disease with heart failure and stage 1 through stage 4 chronic kidney disease, or unspecified chronic kidney disease: Secondary | ICD-10-CM | POA: Diagnosis not present

## 2023-06-22 DIAGNOSIS — Z79899 Other long term (current) drug therapy: Secondary | ICD-10-CM | POA: Diagnosis not present

## 2023-06-22 DIAGNOSIS — I2510 Atherosclerotic heart disease of native coronary artery without angina pectoris: Secondary | ICD-10-CM | POA: Diagnosis not present

## 2023-06-22 DIAGNOSIS — N183 Chronic kidney disease, stage 3 unspecified: Secondary | ICD-10-CM | POA: Diagnosis not present

## 2023-06-22 DIAGNOSIS — E039 Hypothyroidism, unspecified: Secondary | ICD-10-CM | POA: Diagnosis not present

## 2023-06-22 DIAGNOSIS — Z87891 Personal history of nicotine dependence: Secondary | ICD-10-CM | POA: Diagnosis not present

## 2023-06-22 DIAGNOSIS — I5032 Chronic diastolic (congestive) heart failure: Secondary | ICD-10-CM | POA: Diagnosis not present

## 2023-06-23 DIAGNOSIS — N183 Chronic kidney disease, stage 3 unspecified: Secondary | ICD-10-CM | POA: Diagnosis not present

## 2023-06-23 DIAGNOSIS — E039 Hypothyroidism, unspecified: Secondary | ICD-10-CM | POA: Diagnosis not present

## 2023-06-23 DIAGNOSIS — N1832 Chronic kidney disease, stage 3b: Secondary | ICD-10-CM | POA: Diagnosis not present

## 2023-06-23 DIAGNOSIS — R627 Adult failure to thrive: Secondary | ICD-10-CM | POA: Diagnosis not present

## 2023-06-23 DIAGNOSIS — I2510 Atherosclerotic heart disease of native coronary artery without angina pectoris: Secondary | ICD-10-CM | POA: Diagnosis not present

## 2023-06-23 DIAGNOSIS — I130 Hypertensive heart and chronic kidney disease with heart failure and stage 1 through stage 4 chronic kidney disease, or unspecified chronic kidney disease: Secondary | ICD-10-CM | POA: Diagnosis not present

## 2023-06-23 DIAGNOSIS — Z7982 Long term (current) use of aspirin: Secondary | ICD-10-CM | POA: Diagnosis not present

## 2023-06-23 DIAGNOSIS — Z79899 Other long term (current) drug therapy: Secondary | ICD-10-CM | POA: Diagnosis not present

## 2023-06-23 DIAGNOSIS — I5032 Chronic diastolic (congestive) heart failure: Secondary | ICD-10-CM | POA: Diagnosis not present

## 2023-06-23 DIAGNOSIS — Z87891 Personal history of nicotine dependence: Secondary | ICD-10-CM | POA: Diagnosis not present

## 2023-06-24 DIAGNOSIS — Z87891 Personal history of nicotine dependence: Secondary | ICD-10-CM | POA: Diagnosis not present

## 2023-06-24 DIAGNOSIS — I5032 Chronic diastolic (congestive) heart failure: Secondary | ICD-10-CM | POA: Diagnosis not present

## 2023-06-24 DIAGNOSIS — I130 Hypertensive heart and chronic kidney disease with heart failure and stage 1 through stage 4 chronic kidney disease, or unspecified chronic kidney disease: Secondary | ICD-10-CM | POA: Diagnosis not present

## 2023-06-24 DIAGNOSIS — N1832 Chronic kidney disease, stage 3b: Secondary | ICD-10-CM | POA: Diagnosis not present

## 2023-06-24 DIAGNOSIS — Z79899 Other long term (current) drug therapy: Secondary | ICD-10-CM | POA: Diagnosis not present

## 2023-06-24 DIAGNOSIS — I2510 Atherosclerotic heart disease of native coronary artery without angina pectoris: Secondary | ICD-10-CM | POA: Diagnosis not present

## 2023-06-24 DIAGNOSIS — R627 Adult failure to thrive: Secondary | ICD-10-CM | POA: Diagnosis not present

## 2023-06-24 DIAGNOSIS — E039 Hypothyroidism, unspecified: Secondary | ICD-10-CM | POA: Diagnosis not present

## 2023-06-24 DIAGNOSIS — N183 Chronic kidney disease, stage 3 unspecified: Secondary | ICD-10-CM | POA: Diagnosis not present

## 2023-06-24 DIAGNOSIS — Z7982 Long term (current) use of aspirin: Secondary | ICD-10-CM | POA: Diagnosis not present

## 2023-06-25 ENCOUNTER — Encounter (INDEPENDENT_AMBULATORY_CARE_PROVIDER_SITE_OTHER): Payer: Medicare Other | Admitting: Ophthalmology

## 2023-06-25 DIAGNOSIS — R627 Adult failure to thrive: Secondary | ICD-10-CM | POA: Diagnosis not present

## 2023-06-25 DIAGNOSIS — N183 Chronic kidney disease, stage 3 unspecified: Secondary | ICD-10-CM | POA: Diagnosis not present

## 2023-06-25 DIAGNOSIS — Z79899 Other long term (current) drug therapy: Secondary | ICD-10-CM | POA: Diagnosis not present

## 2023-06-25 DIAGNOSIS — E039 Hypothyroidism, unspecified: Secondary | ICD-10-CM | POA: Diagnosis not present

## 2023-06-25 DIAGNOSIS — Z87891 Personal history of nicotine dependence: Secondary | ICD-10-CM | POA: Diagnosis not present

## 2023-06-25 DIAGNOSIS — Z7982 Long term (current) use of aspirin: Secondary | ICD-10-CM | POA: Diagnosis not present

## 2023-06-25 DIAGNOSIS — I2510 Atherosclerotic heart disease of native coronary artery without angina pectoris: Secondary | ICD-10-CM | POA: Diagnosis not present

## 2023-06-25 DIAGNOSIS — N1832 Chronic kidney disease, stage 3b: Secondary | ICD-10-CM | POA: Diagnosis not present

## 2023-06-25 DIAGNOSIS — I130 Hypertensive heart and chronic kidney disease with heart failure and stage 1 through stage 4 chronic kidney disease, or unspecified chronic kidney disease: Secondary | ICD-10-CM | POA: Diagnosis not present

## 2023-06-25 DIAGNOSIS — I5032 Chronic diastolic (congestive) heart failure: Secondary | ICD-10-CM | POA: Diagnosis not present

## 2023-06-26 DIAGNOSIS — I5032 Chronic diastolic (congestive) heart failure: Secondary | ICD-10-CM | POA: Diagnosis not present

## 2023-06-26 DIAGNOSIS — Z79899 Other long term (current) drug therapy: Secondary | ICD-10-CM | POA: Diagnosis not present

## 2023-06-26 DIAGNOSIS — N1832 Chronic kidney disease, stage 3b: Secondary | ICD-10-CM | POA: Diagnosis not present

## 2023-06-26 DIAGNOSIS — I130 Hypertensive heart and chronic kidney disease with heart failure and stage 1 through stage 4 chronic kidney disease, or unspecified chronic kidney disease: Secondary | ICD-10-CM | POA: Diagnosis not present

## 2023-06-26 DIAGNOSIS — Z7982 Long term (current) use of aspirin: Secondary | ICD-10-CM | POA: Diagnosis not present

## 2023-06-26 DIAGNOSIS — R627 Adult failure to thrive: Secondary | ICD-10-CM | POA: Diagnosis not present

## 2023-06-26 DIAGNOSIS — Z87891 Personal history of nicotine dependence: Secondary | ICD-10-CM | POA: Diagnosis not present

## 2023-06-26 DIAGNOSIS — I2510 Atherosclerotic heart disease of native coronary artery without angina pectoris: Secondary | ICD-10-CM | POA: Diagnosis not present

## 2023-06-26 DIAGNOSIS — E039 Hypothyroidism, unspecified: Secondary | ICD-10-CM | POA: Diagnosis not present

## 2023-06-26 DIAGNOSIS — N183 Chronic kidney disease, stage 3 unspecified: Secondary | ICD-10-CM | POA: Diagnosis not present

## 2023-06-27 DIAGNOSIS — I130 Hypertensive heart and chronic kidney disease with heart failure and stage 1 through stage 4 chronic kidney disease, or unspecified chronic kidney disease: Secondary | ICD-10-CM | POA: Diagnosis not present

## 2023-06-27 DIAGNOSIS — N183 Chronic kidney disease, stage 3 unspecified: Secondary | ICD-10-CM | POA: Diagnosis not present

## 2023-06-27 DIAGNOSIS — N1832 Chronic kidney disease, stage 3b: Secondary | ICD-10-CM | POA: Diagnosis not present

## 2023-06-27 DIAGNOSIS — I2510 Atherosclerotic heart disease of native coronary artery without angina pectoris: Secondary | ICD-10-CM | POA: Diagnosis not present

## 2023-06-27 DIAGNOSIS — Z87891 Personal history of nicotine dependence: Secondary | ICD-10-CM | POA: Diagnosis not present

## 2023-06-27 DIAGNOSIS — E039 Hypothyroidism, unspecified: Secondary | ICD-10-CM | POA: Diagnosis not present

## 2023-06-27 DIAGNOSIS — I5032 Chronic diastolic (congestive) heart failure: Secondary | ICD-10-CM | POA: Diagnosis not present

## 2023-06-27 DIAGNOSIS — Z7982 Long term (current) use of aspirin: Secondary | ICD-10-CM | POA: Diagnosis not present

## 2023-06-27 DIAGNOSIS — R627 Adult failure to thrive: Secondary | ICD-10-CM | POA: Diagnosis not present

## 2023-06-27 DIAGNOSIS — Z79899 Other long term (current) drug therapy: Secondary | ICD-10-CM | POA: Diagnosis not present

## 2023-06-28 DIAGNOSIS — Z7982 Long term (current) use of aspirin: Secondary | ICD-10-CM | POA: Diagnosis not present

## 2023-06-28 DIAGNOSIS — I130 Hypertensive heart and chronic kidney disease with heart failure and stage 1 through stage 4 chronic kidney disease, or unspecified chronic kidney disease: Secondary | ICD-10-CM | POA: Diagnosis not present

## 2023-06-28 DIAGNOSIS — E039 Hypothyroidism, unspecified: Secondary | ICD-10-CM | POA: Diagnosis not present

## 2023-06-28 DIAGNOSIS — N1832 Chronic kidney disease, stage 3b: Secondary | ICD-10-CM | POA: Diagnosis not present

## 2023-06-28 DIAGNOSIS — I2510 Atherosclerotic heart disease of native coronary artery without angina pectoris: Secondary | ICD-10-CM | POA: Diagnosis not present

## 2023-06-28 DIAGNOSIS — N183 Chronic kidney disease, stage 3 unspecified: Secondary | ICD-10-CM | POA: Diagnosis not present

## 2023-06-28 DIAGNOSIS — Z79899 Other long term (current) drug therapy: Secondary | ICD-10-CM | POA: Diagnosis not present

## 2023-06-28 DIAGNOSIS — I5032 Chronic diastolic (congestive) heart failure: Secondary | ICD-10-CM | POA: Diagnosis not present

## 2023-06-28 DIAGNOSIS — Z87891 Personal history of nicotine dependence: Secondary | ICD-10-CM | POA: Diagnosis not present

## 2023-06-28 DIAGNOSIS — R627 Adult failure to thrive: Secondary | ICD-10-CM | POA: Diagnosis not present

## 2023-06-29 ENCOUNTER — Ambulatory Visit: Payer: Self-pay

## 2023-06-29 DIAGNOSIS — E039 Hypothyroidism, unspecified: Secondary | ICD-10-CM | POA: Diagnosis not present

## 2023-06-29 DIAGNOSIS — I130 Hypertensive heart and chronic kidney disease with heart failure and stage 1 through stage 4 chronic kidney disease, or unspecified chronic kidney disease: Secondary | ICD-10-CM | POA: Diagnosis not present

## 2023-06-29 DIAGNOSIS — R627 Adult failure to thrive: Secondary | ICD-10-CM | POA: Diagnosis not present

## 2023-06-29 DIAGNOSIS — I2510 Atherosclerotic heart disease of native coronary artery without angina pectoris: Secondary | ICD-10-CM | POA: Diagnosis not present

## 2023-06-29 DIAGNOSIS — I5032 Chronic diastolic (congestive) heart failure: Secondary | ICD-10-CM | POA: Diagnosis not present

## 2023-06-29 DIAGNOSIS — Z87891 Personal history of nicotine dependence: Secondary | ICD-10-CM | POA: Diagnosis not present

## 2023-06-29 DIAGNOSIS — N1832 Chronic kidney disease, stage 3b: Secondary | ICD-10-CM | POA: Diagnosis not present

## 2023-06-29 DIAGNOSIS — Z7982 Long term (current) use of aspirin: Secondary | ICD-10-CM | POA: Diagnosis not present

## 2023-06-29 DIAGNOSIS — N183 Chronic kidney disease, stage 3 unspecified: Secondary | ICD-10-CM | POA: Diagnosis not present
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
## 2023-06-30 DIAGNOSIS — R54 Age-related physical debility: Secondary | ICD-10-CM | POA: Diagnosis not present

## 2023-06-30 DIAGNOSIS — I5032 Chronic diastolic (congestive) heart failure: Secondary | ICD-10-CM | POA: Diagnosis not present

## 2023-06-30 DIAGNOSIS — R5381 Other malaise: Secondary | ICD-10-CM | POA: Diagnosis not present

## 2023-06-30 DIAGNOSIS — R531 Weakness: Secondary | ICD-10-CM | POA: Diagnosis not present

## 2023-06-30 DIAGNOSIS — G3184 Mild cognitive impairment, so stated: Secondary | ICD-10-CM | POA: Diagnosis not present

## 2023-06-30 DIAGNOSIS — R262 Difficulty in walking, not elsewhere classified: Secondary | ICD-10-CM | POA: Diagnosis not present

## 2023-06-30 DIAGNOSIS — I130 Hypertensive heart and chronic kidney disease with heart failure and stage 1 through stage 4 chronic kidney disease, or unspecified chronic kidney disease: Secondary | ICD-10-CM | POA: Diagnosis not present

## 2023-06-30 DIAGNOSIS — Z87891 Personal history of nicotine dependence: Secondary | ICD-10-CM | POA: Diagnosis not present

## 2023-06-30 DIAGNOSIS — N183 Chronic kidney disease, stage 3 unspecified: Secondary | ICD-10-CM | POA: Diagnosis not present

## 2023-06-30 DIAGNOSIS — E038 Other specified hypothyroidism: Secondary | ICD-10-CM | POA: Diagnosis not present

## 2023-06-30 DIAGNOSIS — I2510 Atherosclerotic heart disease of native coronary artery without angina pectoris: Secondary | ICD-10-CM | POA: Diagnosis not present

## 2023-06-30 DIAGNOSIS — I5030 Unspecified diastolic (congestive) heart failure: Secondary | ICD-10-CM | POA: Diagnosis not present

## 2023-06-30 DIAGNOSIS — I10 Essential (primary) hypertension: Secondary | ICD-10-CM | POA: Diagnosis not present

## 2023-06-30 DIAGNOSIS — I509 Heart failure, unspecified: Secondary | ICD-10-CM | POA: Diagnosis not present

## 2023-06-30 DIAGNOSIS — N1832 Chronic kidney disease, stage 3b: Secondary | ICD-10-CM | POA: Diagnosis not present

## 2023-06-30 DIAGNOSIS — G6289 Other specified polyneuropathies: Secondary | ICD-10-CM | POA: Diagnosis not present

## 2023-06-30 DIAGNOSIS — M6281 Muscle weakness (generalized): Secondary | ICD-10-CM | POA: Diagnosis not present

## 2023-06-30 DIAGNOSIS — E039 Hypothyroidism, unspecified: Secondary | ICD-10-CM | POA: Diagnosis not present

## 2023-06-30 DIAGNOSIS — R0781 Pleurodynia: Secondary | ICD-10-CM | POA: Diagnosis not present

## 2023-06-30 DIAGNOSIS — R6889 Other general symptoms and signs: Secondary | ICD-10-CM | POA: Diagnosis not present

## 2023-06-30 DIAGNOSIS — X58XXXA Exposure to other specified factors, initial encounter: Secondary | ICD-10-CM | POA: Diagnosis not present

## 2023-06-30 DIAGNOSIS — Z7982 Long term (current) use of aspirin: Secondary | ICD-10-CM | POA: Diagnosis not present

## 2023-06-30 DIAGNOSIS — R600 Localized edema: Secondary | ICD-10-CM | POA: Diagnosis not present

## 2023-06-30 DIAGNOSIS — Z950 Presence of cardiac pacemaker: Secondary | ICD-10-CM | POA: Diagnosis not present

## 2023-06-30 DIAGNOSIS — R627 Adult failure to thrive: Secondary | ICD-10-CM | POA: Diagnosis not present

## 2023-06-30 DIAGNOSIS — Z743 Need for continuous supervision: Secondary | ICD-10-CM | POA: Diagnosis not present

## 2023-06-30 DIAGNOSIS — E7849 Other hyperlipidemia: Secondary | ICD-10-CM | POA: Diagnosis not present

## 2023-06-30 DIAGNOSIS — N189 Chronic kidney disease, unspecified: Secondary | ICD-10-CM | POA: Diagnosis not present

## 2023-06-30 DIAGNOSIS — S2242XA Multiple fractures of ribs, left side, initial encounter for closed fracture: Secondary | ICD-10-CM | POA: Diagnosis not present

## 2023-07-06 DIAGNOSIS — Z950 Presence of cardiac pacemaker: Secondary | ICD-10-CM | POA: Diagnosis not present

## 2023-07-06 DIAGNOSIS — E038 Other specified hypothyroidism: Secondary | ICD-10-CM | POA: Diagnosis not present

## 2023-07-06 DIAGNOSIS — R627 Adult failure to thrive: Secondary | ICD-10-CM | POA: Diagnosis not present

## 2023-07-06 DIAGNOSIS — R54 Age-related physical debility: Secondary | ICD-10-CM | POA: Diagnosis not present

## 2023-07-06 DIAGNOSIS — E7849 Other hyperlipidemia: Secondary | ICD-10-CM | POA: Diagnosis not present

## 2023-07-06 DIAGNOSIS — I5030 Unspecified diastolic (congestive) heart failure: Secondary | ICD-10-CM | POA: Diagnosis not present

## 2023-07-06 DIAGNOSIS — I2510 Atherosclerotic heart disease of native coronary artery without angina pectoris: Secondary | ICD-10-CM | POA: Diagnosis not present

## 2023-07-06 DIAGNOSIS — N183 Chronic kidney disease, stage 3 unspecified: Secondary | ICD-10-CM | POA: Diagnosis not present

## 2023-07-06 DIAGNOSIS — I10 Essential (primary) hypertension: Secondary | ICD-10-CM | POA: Diagnosis not present

## 2023-07-06 DIAGNOSIS — G6289 Other specified polyneuropathies: Secondary | ICD-10-CM | POA: Diagnosis not present

## 2023-07-06 DIAGNOSIS — G3184 Mild cognitive impairment, so stated: Secondary | ICD-10-CM | POA: Diagnosis not present

## 2023-07-15 DIAGNOSIS — I509 Heart failure, unspecified: Secondary | ICD-10-CM | POA: Diagnosis not present

## 2023-07-15 DIAGNOSIS — R5381 Other malaise: Secondary | ICD-10-CM | POA: Diagnosis not present

## 2023-07-22 ENCOUNTER — Encounter (INDEPENDENT_AMBULATORY_CARE_PROVIDER_SITE_OTHER): Payer: Medicare Other | Admitting: Ophthalmology

## 2023-07-27 ENCOUNTER — Ambulatory Visit: Payer: Medicare Other | Admitting: Family Medicine

## 2023-07-28 DIAGNOSIS — I509 Heart failure, unspecified: Secondary | ICD-10-CM | POA: Diagnosis not present

## 2023-07-28 DIAGNOSIS — I10 Essential (primary) hypertension: Secondary | ICD-10-CM | POA: Diagnosis not present

## 2023-08-13 ENCOUNTER — Encounter (INDEPENDENT_AMBULATORY_CARE_PROVIDER_SITE_OTHER): Payer: Medicare Other | Admitting: Ophthalmology

## 2023-08-16 DIAGNOSIS — E038 Other specified hypothyroidism: Secondary | ICD-10-CM | POA: Diagnosis not present

## 2023-08-16 DIAGNOSIS — E7849 Other hyperlipidemia: Secondary | ICD-10-CM | POA: Diagnosis not present

## 2023-08-16 DIAGNOSIS — I5030 Unspecified diastolic (congestive) heart failure: Secondary | ICD-10-CM | POA: Diagnosis not present

## 2023-08-17 DIAGNOSIS — N183 Chronic kidney disease, stage 3 unspecified: Secondary | ICD-10-CM | POA: Diagnosis not present

## 2023-08-17 DIAGNOSIS — E7849 Other hyperlipidemia: Secondary | ICD-10-CM | POA: Diagnosis not present

## 2023-08-17 DIAGNOSIS — I10 Essential (primary) hypertension: Secondary | ICD-10-CM | POA: Diagnosis not present

## 2023-08-17 DIAGNOSIS — E038 Other specified hypothyroidism: Secondary | ICD-10-CM | POA: Diagnosis not present

## 2023-08-18 DIAGNOSIS — E7849 Other hyperlipidemia: Secondary | ICD-10-CM | POA: Diagnosis not present

## 2023-08-18 DIAGNOSIS — E038 Other specified hypothyroidism: Secondary | ICD-10-CM | POA: Diagnosis not present

## 2023-08-18 DIAGNOSIS — I5030 Unspecified diastolic (congestive) heart failure: Secondary | ICD-10-CM | POA: Diagnosis not present

## 2023-08-20 ENCOUNTER — Encounter (INDEPENDENT_AMBULATORY_CARE_PROVIDER_SITE_OTHER): Payer: Medicare Other | Admitting: Ophthalmology

## 2023-08-20 DIAGNOSIS — H43813 Vitreous degeneration, bilateral: Secondary | ICD-10-CM

## 2023-08-20 DIAGNOSIS — H35033 Hypertensive retinopathy, bilateral: Secondary | ICD-10-CM | POA: Diagnosis not present

## 2023-08-20 DIAGNOSIS — H353231 Exudative age-related macular degeneration, bilateral, with active choroidal neovascularization: Secondary | ICD-10-CM | POA: Diagnosis not present

## 2023-08-20 DIAGNOSIS — I10 Essential (primary) hypertension: Secondary | ICD-10-CM

## 2023-08-23 ENCOUNTER — Other Ambulatory Visit: Payer: Self-pay

## 2023-08-23 DIAGNOSIS — M85871 Other specified disorders of bone density and structure, right ankle and foot: Secondary | ICD-10-CM | POA: Diagnosis not present

## 2023-08-23 DIAGNOSIS — I6782 Cerebral ischemia: Secondary | ICD-10-CM | POA: Diagnosis not present

## 2023-08-23 DIAGNOSIS — N1832 Chronic kidney disease, stage 3b: Secondary | ICD-10-CM | POA: Diagnosis not present

## 2023-08-23 DIAGNOSIS — E782 Mixed hyperlipidemia: Secondary | ICD-10-CM | POA: Diagnosis present

## 2023-08-23 DIAGNOSIS — I959 Hypotension, unspecified: Secondary | ICD-10-CM | POA: Diagnosis not present

## 2023-08-23 DIAGNOSIS — K219 Gastro-esophageal reflux disease without esophagitis: Secondary | ICD-10-CM | POA: Diagnosis not present

## 2023-08-23 DIAGNOSIS — Z046 Encounter for general psychiatric examination, requested by authority: Secondary | ICD-10-CM | POA: Diagnosis present

## 2023-08-23 DIAGNOSIS — I10 Essential (primary) hypertension: Secondary | ICD-10-CM | POA: Diagnosis not present

## 2023-08-23 DIAGNOSIS — I130 Hypertensive heart and chronic kidney disease with heart failure and stage 1 through stage 4 chronic kidney disease, or unspecified chronic kidney disease: Secondary | ICD-10-CM | POA: Diagnosis not present

## 2023-08-23 DIAGNOSIS — R059 Cough, unspecified: Secondary | ICD-10-CM | POA: Diagnosis not present

## 2023-08-23 DIAGNOSIS — T50916A Underdosing of multiple unspecified drugs, medicaments and biological substances, initial encounter: Secondary | ICD-10-CM | POA: Diagnosis present

## 2023-08-23 DIAGNOSIS — E039 Hypothyroidism, unspecified: Secondary | ICD-10-CM | POA: Diagnosis not present

## 2023-08-23 DIAGNOSIS — Z751 Person awaiting admission to adequate facility elsewhere: Secondary | ICD-10-CM

## 2023-08-23 DIAGNOSIS — R6510 Systemic inflammatory response syndrome (SIRS) of non-infectious origin without acute organ dysfunction: Secondary | ICD-10-CM | POA: Diagnosis not present

## 2023-08-23 DIAGNOSIS — Z515 Encounter for palliative care: Secondary | ICD-10-CM

## 2023-08-23 DIAGNOSIS — J9811 Atelectasis: Secondary | ICD-10-CM | POA: Diagnosis not present

## 2023-08-23 DIAGNOSIS — R680 Hypothermia, not associated with low environmental temperature: Secondary | ICD-10-CM | POA: Diagnosis not present

## 2023-08-23 DIAGNOSIS — I442 Atrioventricular block, complete: Secondary | ICD-10-CM | POA: Diagnosis not present

## 2023-08-23 DIAGNOSIS — M19071 Primary osteoarthritis, right ankle and foot: Secondary | ICD-10-CM | POA: Diagnosis not present

## 2023-08-23 DIAGNOSIS — R918 Other nonspecific abnormal finding of lung field: Secondary | ICD-10-CM | POA: Diagnosis not present

## 2023-08-23 DIAGNOSIS — I517 Cardiomegaly: Secondary | ICD-10-CM | POA: Diagnosis not present

## 2023-08-23 DIAGNOSIS — Z9079 Acquired absence of other genital organ(s): Secondary | ICD-10-CM

## 2023-08-23 DIAGNOSIS — E876 Hypokalemia: Secondary | ICD-10-CM | POA: Diagnosis present

## 2023-08-23 DIAGNOSIS — F03911 Unspecified dementia, unspecified severity, with agitation: Secondary | ICD-10-CM | POA: Diagnosis present

## 2023-08-23 DIAGNOSIS — Z593 Problems related to living in residential institution: Secondary | ICD-10-CM

## 2023-08-23 DIAGNOSIS — M25512 Pain in left shoulder: Secondary | ICD-10-CM | POA: Diagnosis not present

## 2023-08-23 DIAGNOSIS — Z950 Presence of cardiac pacemaker: Secondary | ICD-10-CM | POA: Diagnosis not present

## 2023-08-23 DIAGNOSIS — T68XXXA Hypothermia, initial encounter: Secondary | ICD-10-CM | POA: Diagnosis not present

## 2023-08-23 DIAGNOSIS — Z8549 Personal history of malignant neoplasm of other male genital organs: Secondary | ICD-10-CM

## 2023-08-23 DIAGNOSIS — F0393 Unspecified dementia, unspecified severity, with mood disturbance: Secondary | ICD-10-CM | POA: Diagnosis present

## 2023-08-23 DIAGNOSIS — N183 Chronic kidney disease, stage 3 unspecified: Secondary | ICD-10-CM | POA: Diagnosis not present

## 2023-08-23 DIAGNOSIS — M19012 Primary osteoarthritis, left shoulder: Secondary | ICD-10-CM | POA: Diagnosis not present

## 2023-08-23 DIAGNOSIS — R079 Chest pain, unspecified: Secondary | ICD-10-CM | POA: Diagnosis not present

## 2023-08-23 DIAGNOSIS — I739 Peripheral vascular disease, unspecified: Secondary | ICD-10-CM | POA: Diagnosis not present

## 2023-08-23 DIAGNOSIS — Z653 Problems related to other legal circumstances: Secondary | ICD-10-CM

## 2023-08-23 DIAGNOSIS — L24A2 Irritant contact dermatitis due to fecal, urinary or dual incontinence: Secondary | ICD-10-CM | POA: Diagnosis present

## 2023-08-23 DIAGNOSIS — I700 Atherosclerosis of aorta: Secondary | ICD-10-CM | POA: Diagnosis not present

## 2023-08-23 DIAGNOSIS — W19XXXA Unspecified fall, initial encounter: Secondary | ICD-10-CM | POA: Diagnosis present

## 2023-08-23 DIAGNOSIS — Z8261 Family history of arthritis: Secondary | ICD-10-CM

## 2023-08-23 DIAGNOSIS — K8020 Calculus of gallbladder without cholecystitis without obstruction: Secondary | ICD-10-CM | POA: Diagnosis present

## 2023-08-23 DIAGNOSIS — L89159 Pressure ulcer of sacral region, unspecified stage: Secondary | ICD-10-CM | POA: Diagnosis present

## 2023-08-23 DIAGNOSIS — Z87440 Personal history of urinary (tract) infections: Secondary | ICD-10-CM

## 2023-08-23 DIAGNOSIS — N179 Acute kidney failure, unspecified: Secondary | ICD-10-CM | POA: Diagnosis present

## 2023-08-23 DIAGNOSIS — R627 Adult failure to thrive: Secondary | ICD-10-CM | POA: Diagnosis not present

## 2023-08-23 DIAGNOSIS — M7989 Other specified soft tissue disorders: Secondary | ICD-10-CM | POA: Diagnosis not present

## 2023-08-23 DIAGNOSIS — Z66 Do not resuscitate: Secondary | ICD-10-CM | POA: Diagnosis not present

## 2023-08-23 DIAGNOSIS — Z8042 Family history of malignant neoplasm of prostate: Secondary | ICD-10-CM

## 2023-08-23 DIAGNOSIS — E785 Hyperlipidemia, unspecified: Secondary | ICD-10-CM | POA: Diagnosis not present

## 2023-08-23 DIAGNOSIS — N185 Chronic kidney disease, stage 5: Secondary | ICD-10-CM | POA: Diagnosis not present

## 2023-08-23 DIAGNOSIS — R9431 Abnormal electrocardiogram [ECG] [EKG]: Secondary | ICD-10-CM | POA: Diagnosis not present

## 2023-08-23 DIAGNOSIS — Z1152 Encounter for screening for COVID-19: Secondary | ICD-10-CM

## 2023-08-23 DIAGNOSIS — R5381 Other malaise: Secondary | ICD-10-CM | POA: Diagnosis present

## 2023-08-23 DIAGNOSIS — I5032 Chronic diastolic (congestive) heart failure: Secondary | ICD-10-CM | POA: Diagnosis not present

## 2023-08-23 DIAGNOSIS — I2510 Atherosclerotic heart disease of native coronary artery without angina pectoris: Secondary | ICD-10-CM | POA: Diagnosis not present

## 2023-08-23 DIAGNOSIS — F329 Major depressive disorder, single episode, unspecified: Secondary | ICD-10-CM | POA: Diagnosis present

## 2023-08-23 DIAGNOSIS — D696 Thrombocytopenia, unspecified: Secondary | ICD-10-CM | POA: Diagnosis not present

## 2023-08-23 DIAGNOSIS — Z801 Family history of malignant neoplasm of trachea, bronchus and lung: Secondary | ICD-10-CM

## 2023-08-23 DIAGNOSIS — E870 Hyperosmolality and hypernatremia: Secondary | ICD-10-CM | POA: Diagnosis not present

## 2023-08-23 DIAGNOSIS — Z91128 Patient's intentional underdosing of medication regimen for other reason: Secondary | ICD-10-CM

## 2023-08-23 DIAGNOSIS — Z85828 Personal history of other malignant neoplasm of skin: Secondary | ICD-10-CM

## 2023-08-23 DIAGNOSIS — F03918 Unspecified dementia, unspecified severity, with other behavioral disturbance: Secondary | ICD-10-CM | POA: Diagnosis not present

## 2023-08-23 DIAGNOSIS — F0392 Unspecified dementia, unspecified severity, with psychotic disturbance: Secondary | ICD-10-CM | POA: Diagnosis present

## 2023-08-23 DIAGNOSIS — R531 Weakness: Secondary | ICD-10-CM | POA: Diagnosis not present

## 2023-08-23 DIAGNOSIS — Z6829 Body mass index (BMI) 29.0-29.9, adult: Secondary | ICD-10-CM

## 2023-08-23 DIAGNOSIS — H9193 Unspecified hearing loss, bilateral: Secondary | ICD-10-CM | POA: Diagnosis present

## 2023-08-23 DIAGNOSIS — F0394 Unspecified dementia, unspecified severity, with anxiety: Secondary | ICD-10-CM | POA: Diagnosis present

## 2023-08-23 DIAGNOSIS — N189 Chronic kidney disease, unspecified: Secondary | ICD-10-CM | POA: Diagnosis not present

## 2023-08-23 DIAGNOSIS — Z8249 Family history of ischemic heart disease and other diseases of the circulatory system: Secondary | ICD-10-CM

## 2023-08-23 DIAGNOSIS — R0989 Other specified symptoms and signs involving the circulatory and respiratory systems: Secondary | ICD-10-CM | POA: Diagnosis not present

## 2023-08-23 DIAGNOSIS — R4689 Other symptoms and signs involving appearance and behavior: Secondary | ICD-10-CM | POA: Diagnosis not present

## 2023-08-23 DIAGNOSIS — T68XXXS Hypothermia, sequela: Secondary | ICD-10-CM | POA: Diagnosis not present

## 2023-08-23 DIAGNOSIS — Z91148 Patient's other noncompliance with medication regimen for other reason: Secondary | ICD-10-CM

## 2023-08-23 LAB — CBC
HCT: 42.1 % (ref 39.0–52.0)
Hemoglobin: 13.6 g/dL (ref 13.0–17.0)
MCH: 26.1 pg (ref 26.0–34.0)
MCHC: 32.3 g/dL (ref 30.0–36.0)
MCV: 80.7 fL (ref 80.0–100.0)
Platelets: 134 10*3/uL — ABNORMAL LOW (ref 150–400)
RBC: 5.22 MIL/uL (ref 4.22–5.81)
RDW: 14.9 % (ref 11.5–15.5)
WBC: 7.6 10*3/uL (ref 4.0–10.5)
nRBC: 0 % (ref 0.0–0.2)

## 2023-08-23 LAB — COMPREHENSIVE METABOLIC PANEL
ALT: 25 U/L (ref 0–44)
AST: 29 U/L (ref 15–41)
Albumin: 4 g/dL (ref 3.5–5.0)
Alkaline Phosphatase: 100 U/L (ref 38–126)
Anion gap: 13 (ref 5–15)
BUN: 23 mg/dL (ref 8–23)
CO2: 23 mmol/L (ref 22–32)
Calcium: 9 mg/dL (ref 8.9–10.3)
Chloride: 102 mmol/L (ref 98–111)
Creatinine, Ser: 1.38 mg/dL — ABNORMAL HIGH (ref 0.61–1.24)
GFR, Estimated: 49 mL/min — ABNORMAL LOW (ref >60)
Glucose, Bld: 104 mg/dL — ABNORMAL HIGH (ref 70–99)
Potassium: 3.2 mmol/L — ABNORMAL LOW (ref 3.5–5.1)
Sodium: 138 mmol/L (ref 135–145)
Total Bilirubin: 0.9 mg/dL (ref 0.0–1.2)
Total Protein: 6.5 g/dL (ref 6.5–8.1)
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
## 2023-08-23 NOTE — ED Notes (Signed)
 White shirt [employer] Blue socks

## 2023-08-24 ENCOUNTER — Inpatient Hospital Stay
Admission: EM | Admit: 2023-08-24 | Discharge: 2023-12-12 | DRG: 948 | Disposition: E | Payer: Medicare Other | Attending: Internal Medicine | Admitting: Internal Medicine

## 2023-08-24 DIAGNOSIS — D696 Thrombocytopenia, unspecified: Secondary | ICD-10-CM | POA: Diagnosis present

## 2023-08-24 DIAGNOSIS — E876 Hypokalemia: Secondary | ICD-10-CM | POA: Diagnosis not present

## 2023-08-24 DIAGNOSIS — I10 Essential (primary) hypertension: Secondary | ICD-10-CM | POA: Diagnosis present

## 2023-08-24 DIAGNOSIS — F0390 Unspecified dementia without behavioral disturbance: Secondary | ICD-10-CM | POA: Diagnosis present

## 2023-08-24 DIAGNOSIS — I442 Atrioventricular block, complete: Secondary | ICD-10-CM | POA: Diagnosis present

## 2023-08-24 DIAGNOSIS — R627 Adult failure to thrive: Secondary | ICD-10-CM

## 2023-08-24 DIAGNOSIS — E039 Hypothyroidism, unspecified: Secondary | ICD-10-CM | POA: Diagnosis present

## 2023-08-24 DIAGNOSIS — L8990 Pressure ulcer of unspecified site, unspecified stage: Secondary | ICD-10-CM | POA: Insufficient documentation

## 2023-08-24 DIAGNOSIS — N189 Chronic kidney disease, unspecified: Secondary | ICD-10-CM | POA: Diagnosis present

## 2023-08-24 DIAGNOSIS — E870 Hyperosmolality and hypernatremia: Secondary | ICD-10-CM

## 2023-08-24 DIAGNOSIS — T68XXXA Hypothermia, initial encounter: Secondary | ICD-10-CM | POA: Diagnosis present

## 2023-08-24 DIAGNOSIS — R4689 Other symptoms and signs involving appearance and behavior: Principal | ICD-10-CM | POA: Insufficient documentation

## 2023-08-24 DIAGNOSIS — Z515 Encounter for palliative care: Secondary | ICD-10-CM

## 2023-08-24 DIAGNOSIS — E782 Mixed hyperlipidemia: Secondary | ICD-10-CM | POA: Diagnosis present

## 2023-08-24 DIAGNOSIS — F03918 Unspecified dementia, unspecified severity, with other behavioral disturbance: Secondary | ICD-10-CM

## 2023-08-24 DIAGNOSIS — K219 Gastro-esophageal reflux disease without esophagitis: Secondary | ICD-10-CM | POA: Diagnosis present

## 2023-08-24 DIAGNOSIS — N179 Acute kidney failure, unspecified: Secondary | ICD-10-CM

## 2023-08-24 DIAGNOSIS — I5030 Unspecified diastolic (congestive) heart failure: Secondary | ICD-10-CM | POA: Diagnosis present

## 2023-08-24 DIAGNOSIS — R9431 Abnormal electrocardiogram [ECG] [EKG]: Secondary | ICD-10-CM | POA: Diagnosis not present

## 2023-08-24 LAB — TROPONIN I (HIGH SENSITIVITY)
Troponin I (High Sensitivity): 34 ng/L — ABNORMAL HIGH (ref <18)
Troponin I (High Sensitivity): 30 ng/L — ABNORMAL HIGH (ref <18)

## 2023-08-24 MED ORDER — POTASSIUM CHLORIDE CRYS ER 20 MEQ PO TBCR
40.0000 meq | EXTENDED_RELEASE_TABLET | Freq: Once | ORAL | Status: AC
  Administered 2023-08-24: 40 meq via ORAL
  Filled 2023-08-24: qty 2

## 2023-08-24 MED ORDER — OLANZAPINE 5 MG PO TABS
2.5000 mg | ORAL_TABLET | Freq: Every day | ORAL | Status: DC
Start: 2023-08-24 — End: 2023-11-19
  Administered 2023-08-24 – 2023-11-19 (×81): 2.5 mg via ORAL
  Filled 2023-08-24 (×83): qty 1

## 2023-08-24 MED ORDER — OLANZAPINE 10 MG IM SOLR
5.0000 mg | Freq: Three times a day (TID) | INTRAMUSCULAR | Status: DC | PRN
  Administered 2023-08-24 – 2023-11-19 (×45): 5 mg via INTRAMUSCULAR
  Filled 2023-08-24 (×48): qty 10

## 2023-08-24 MED ORDER — SERTRALINE HCL 50 MG PO TABS
100.0000 mg | ORAL_TABLET | Freq: Two times a day (BID) | ORAL | Status: DC
  Administered 2023-08-24 – 2023-09-27 (×66): 100 mg via ORAL
  Filled 2023-08-24 (×66): qty 2

## 2023-08-24 NOTE — ED Notes (Signed)
Pt's brief and pants were changed due to soiling. Bath wipes were used to perform pericare.

## 2023-08-24 NOTE — ED Notes (Signed)
IVC/in-pt psych admit
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
## 2023-08-24 NOTE — ED Notes (Signed)
Pt resting quietly on stretcher with eyes open. Alert and appears calm.
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
## 2023-08-24 NOTE — ED Notes (Signed)
Pt ambulatory to restroom with ED tech.

## 2023-08-24 NOTE — ED Notes (Signed)
Changed pt brief and scrub pants. Pt wiped self with bath wipes. Changed out bed linen and put clean linen on bed.

## 2023-08-24 NOTE — ED Notes (Signed)
NP at the bedside for pt evaluation
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
## 2023-08-24 NOTE — ED Notes (Signed)
Breakfast tray and orange juice provided

## 2023-08-24 NOTE — ED Notes (Signed)
Pt provided with lunch tray. Pt sitting up eating.
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
## 2023-08-24 NOTE — BH Assessment (Signed)
Adult/GERO MH  Referral information for Psychiatric Hospitalization faxed to:   [facility] ([phone])   [facility] [phone]- [phone], [phone])   [facility] [phone] or [phone]),  . [facility] [phone]- [phone]),

## 2023-08-24 NOTE — ED Notes (Signed)
ivc prior to arrival/psych consult ordered/pending.Marland KitchenMarland Kitchen

## 2023-08-24 NOTE — ED Notes (Signed)
Hospital meal provided, pt tolerated w/o complaints.  Waste discarded appropriately.

## 2023-08-24 NOTE — ED Notes (Signed)
Pt speaking to officer with increasingly loud voice. Repeatedly states about how fat and big police officer is and states he needs to eat more so he doesn't lose any weight. This nurse attempted to deescalate pt unsuccessfully.

## 2023-08-25 DIAGNOSIS — F03918 Unspecified dementia, unspecified severity, with other behavioral disturbance: Secondary | ICD-10-CM | POA: Diagnosis not present

## 2023-08-25 DIAGNOSIS — E876 Hypokalemia: Secondary | ICD-10-CM | POA: Diagnosis not present

## 2023-08-25 NOTE — ED Notes (Signed)
IVC/Pending Placement

## 2023-08-25 NOTE — ED Notes (Signed)
Patients brief and pants changed due to being soiled. Chuxs pads changed along with bed sheets. Pt is resting comfortably in bed.

## 2023-08-25 NOTE — ED Notes (Signed)
IVC/Pending placement

## 2023-08-25 NOTE — ED Notes (Signed)
Patient brief changed.

## 2023-08-25 NOTE — ED Provider Notes (Signed)
Emergency Medicine Observation Re-evaluation Note  Hunter [name] is a 89 y.o. male, seen on rounds today.  Pt initially presented to the ED for complaints of Psychiatric Evaluation Currently, the patient is resting, voices no medical complaints.  Physical Exam  BP 132/67 (BP Location: Left Arm)   Pulse 63   Temp 98.2 F (36.8 C) (Oral)   Resp 18   Ht 6' (1.829 m)   Wt 97.5 kg   SpO2 96%   BMI 29.16 kg/m  Physical Exam General: Resting in no acute distress Cardiac: No cyanosis Lungs: Equal rise and fall Psych: Not agitated with  ED Course / MDM  EKG:EKG Interpretation Date/Time:  Tuesday August 24 2023 00:56:29 EST Ventricular Rate:  68 PR Interval:  202 QRS Duration:  200 QT Interval:  484 QTC Calculation: 514 R Axis:   -69  Text Interpretation: Atrial-sensed ventricular-paced rhythm Abnormal ECG When compared with ECG of 23-Jan-2023 17:47, PREVIOUS ECG IS PRESENT Confirmed by UNCONFIRMED, DOCTOR ([id number]), editor Lonell Face ([id number]) on 08/24/2023 7:41:44 AM  I have reviewed the labs performed to date as well as medications administered while in observation.  Recent changes in the last 24 hours include no events overnight.  Plan  Current plan is for psychiatric disposition.    Irean Hong, MD 08/25/23 [phone]

## 2023-08-25 NOTE — ED Notes (Signed)
Patient given dinner tray at this time.

## 2023-08-25 NOTE — ED Notes (Signed)
Breakfast tray provided for pt.

## 2023-08-25 NOTE — ED Notes (Signed)
Lunch tray provided to pt. Waste discarded appropriately.
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
## 2023-08-25 NOTE — ED Notes (Signed)
This tech obtained pt's vital signs.

## 2023-08-26 ENCOUNTER — Other Ambulatory Visit: Payer: Self-pay | Admitting: Family Medicine

## 2023-08-26 DIAGNOSIS — E876 Hypokalemia: Secondary | ICD-10-CM | POA: Diagnosis not present

## 2023-08-26 LAB — URINALYSIS, ROUTINE W REFLEX MICROSCOPIC
Bilirubin Urine: NEGATIVE
Glucose, UA: NEGATIVE mg/dL
Ketones, ur: NEGATIVE mg/dL
Nitrite: POSITIVE — AB
Protein, ur: 100 mg/dL — AB
Specific Gravity, Urine: 1.011 (ref 1.005–1.030)
WBC, UA: >50 WBC/hpf (ref 0–5)
pH: 7 (ref 5.0–8.0)

## 2023-08-26 MED ORDER — CEPHALEXIN 500 MG PO CAPS
500.0000 mg | ORAL_CAPSULE | Freq: Three times a day (TID) | ORAL | Status: DC
  Administered 2023-08-26 – 2023-09-04 (×26): 500 mg via ORAL
  Filled 2023-08-26 (×26): qty 1

## 2023-08-26 MED ORDER — LORAZEPAM 1 MG PO TABS
1.0000 mg | ORAL_TABLET | Freq: Once | ORAL | Status: AC
  Administered 2023-08-26: 1 mg via ORAL
  Filled 2023-08-26: qty 1

## 2023-08-26 NOTE — BH Assessment (Signed)
Referral information for Psychiatric Hospitalization RE-faxed to;   [facility] [phone]) No available beds  [facility] [phone]),   [facility] [phone]) [phone]  [facility] [phone]),   [facility] [phone] -or- [phone]),    [facility] [phone]),  [place] [phone])  [place] [phone] or [phone]),   [facility] [phone])

## 2023-08-26 NOTE — ED Provider Notes (Signed)
Emergency Medicine Observation Re-evaluation Note  Hunter [name] is a 89 y.o. male, seen on rounds today.  Pt initially presented to the ED for complaints of Psychiatric Evaluation Currently, the patient is a little agitated, not verbally redirectable.  Physical Exam  BP (!) 144/63 (BP Location: Left Arm)   Pulse 60   Temp 97.8 F (36.6 C) (Oral)   Resp 18   Ht 6' (1.829 m)   Wt 97.5 kg   SpO2 96%   BMI 29.16 kg/m  Physical Exam Gen:  No acute distress Resp:  Breathing easily and comfortably, no accessory muscle usage Neuro:  Moving all four extremities, no gross focal neuro deficits Psych: Slightly agitated ED Course / MDM  EKG:EKG Interpretation Date/Time:  Tuesday August 24 2023 00:56:29 EST Ventricular Rate:  68 PR Interval:  202 QRS Duration:  200 QT Interval:  484 QTC Calculation: 514 R Axis:   -69  Text Interpretation: Atrial-sensed ventricular-paced rhythm Abnormal ECG When compared with ECG of 23-Jan-2023 17:47, PREVIOUS ECG IS PRESENT Confirmed by UNCONFIRMED, DOCTOR ([id number]), editor Lonell Face [id number]) on 08/24/2023 7:41:44 AM  I have reviewed the labs performed to date as well as medications administered while in observation.  Recent changes in the last 24 hours include.  He received his as needed Zyprexa.  QTc is over 500 on last EKG.  Plan  Current plan is for will give him some p.o. Ativan, and continue to monitor pending psych dispo.    Claybon Jabs, MD 08/26/23 [phone]

## 2023-08-26 NOTE — ED Notes (Signed)
Pt cleaned up with assist of other staff members. Pt given new gown, sheets, brief and chucks. Pt resting comfortably in the bed at this time with no other need.

## 2023-08-26 NOTE — ED Notes (Signed)
IVC/Pending Placement

## 2023-08-26 NOTE — ED Notes (Signed)
This tech and RN Dahlia Client changed pt and changed linens on bed. Pt urinated on this techs arm during brief change. Pt is dry, and comfortably resting in bed. No other needs were stated.

## 2023-08-26 NOTE — ED Notes (Signed)
On assessment at handoff, patient is alert and in bed talking nonstop about a series of irrelevant topics to no one in particular. No signs of sedation taking effect at this time.

## 2023-08-27 DIAGNOSIS — E876 Hypokalemia: Secondary | ICD-10-CM | POA: Diagnosis not present

## 2023-08-27 MED ORDER — AMLODIPINE BESYLATE 5 MG PO TABS
2.5000 mg | ORAL_TABLET | Freq: Every day | ORAL | Status: DC
  Administered 2023-08-27 – 2023-11-29 (×77): 2.5 mg via ORAL
  Filled 2023-08-27 (×90): qty 1

## 2023-08-27 MED ORDER — PANTOPRAZOLE SODIUM 40 MG PO TBEC
40.0000 mg | DELAYED_RELEASE_TABLET | Freq: Every day | ORAL | Status: DC
  Administered 2023-08-27 – 2023-11-29 (×89): 40 mg via ORAL
  Filled 2023-08-27 (×92): qty 1

## 2023-08-27 MED ORDER — DIVALPROEX SODIUM 125 MG PO DR TAB
125.0000 mg | DELAYED_RELEASE_TABLET | Freq: Three times a day (TID) | ORAL | Status: DC
  Administered 2023-08-27 – 2023-10-04 (×108): 125 mg via ORAL
  Filled 2023-08-27 (×108): qty 1

## 2023-08-27 MED ORDER — LEVOTHYROXINE SODIUM 50 MCG PO TABS
75.0000 ug | ORAL_TABLET | Freq: Every morning | ORAL | Status: DC
  Administered 2023-08-27 – 2023-11-29 (×89): 75 ug via ORAL
  Filled 2023-08-27 (×4): qty 2
  Filled 2023-08-27: qty 1
  Filled 2023-08-27 (×7): qty 2
  Filled 2023-08-27: qty 1.5
  Filled 2023-08-27 (×13): qty 2
  Filled 2023-08-27: qty 1
  Filled 2023-08-27 (×3): qty 2
  Filled 2023-08-27: qty 1
  Filled 2023-08-27 (×14): qty 2
  Filled 2023-08-27: qty 1
  Filled 2023-08-27 (×3): qty 2
  Filled 2023-08-27: qty 1
  Filled 2023-08-27 (×2): qty 2
  Filled 2023-08-27: qty 1
  Filled 2023-08-27 (×5): qty 2
  Filled 2023-08-27: qty 1
  Filled 2023-08-27 (×30): qty 2
  Filled 2023-08-27: qty 1
  Filled 2023-08-27 (×2): qty 2

## 2023-08-27 MED ORDER — FUROSEMIDE 40 MG PO TABS
80.0000 mg | ORAL_TABLET | Freq: Every day | ORAL | Status: DC
  Administered 2023-08-27 – 2023-11-26 (×85): 80 mg via ORAL
  Filled 2023-08-27 (×87): qty 2

## 2023-08-27 MED ORDER — CYANOCOBALAMIN 1000 MCG/ML IJ SOLN
1000.0000 ug | INTRAMUSCULAR | Status: DC
  Administered 2023-09-22 – 2023-11-21 (×3): 1000 ug via INTRAMUSCULAR
  Filled 2023-08-27 (×4): qty 1

## 2023-08-27 MED ORDER — FERROUS SULFATE 325 (65 FE) MG PO TABS
325.0000 mg | ORAL_TABLET | Freq: Every day | ORAL | Status: DC
  Administered 2023-08-27 – 2023-11-29 (×89): 325 mg via ORAL
  Filled 2023-08-27 (×91): qty 1

## 2023-08-27 MED ORDER — ALBUTEROL SULFATE (2.5 MG/3ML) 0.083% IN NEBU: 2.5000 mg | INHALATION_SOLUTION | Freq: Three times a day (TID) | RESPIRATORY_TRACT | Status: DC | PRN

## 2023-08-27 MED ORDER — ASPIRIN 81 MG PO CHEW
81.0000 mg | CHEWABLE_TABLET | Freq: Every day | ORAL | Status: DC
  Administered 2023-08-27 – 2023-11-29 (×89): 81 mg via ORAL
  Filled 2023-08-27 (×92): qty 1

## 2023-08-27 MED ORDER — ATORVASTATIN CALCIUM 20 MG PO TABS
20.0000 mg | ORAL_TABLET | Freq: Every day | ORAL | Status: DC
  Administered 2023-08-27 – 2023-11-30 (×90): 20 mg via ORAL
  Filled 2023-08-27 (×94): qty 1

## 2023-08-27 MED ORDER — LOSARTAN POTASSIUM 50 MG PO TABS
50.0000 mg | ORAL_TABLET | Freq: Every day | ORAL | Status: DC
  Administered 2023-08-27 – 2023-11-29 (×80): 50 mg via ORAL
  Filled 2023-08-27 (×88): qty 1

## 2023-08-27 NOTE — BH Assessment (Signed)
TTS continues to look for psych inpatient bed but is unable to secure one at this time.

## 2023-08-27 NOTE — ED Provider Notes (Signed)
Emergency Medicine Observation Re-evaluation Note  Physical Exam   BP (!) 128/55 (BP Location: Left Arm)   Pulse 64   Temp 98.1 F (36.7 C) (Oral)   Resp 16   Ht 6' (1.829 m)   Wt 97.5 kg   SpO2 98%   BMI 29.16 kg/m   Patient appears in no acute distress.  ED Course / MDM   No reported events during my shift at the time of this note.   Pt is awaiting dispo from consultants   Pilar Jarvis MD    Pilar Jarvis, MD 08/27/23 [phone]

## 2023-08-27 NOTE — ED Notes (Signed)
Hospital meal provided, pt tolerated w/o complaints.  Waste discarded appropriately.

## 2023-08-27 NOTE — ED Notes (Signed)
Pt was assisted to the bathroom with a walker. Upon standing, brief was saturated with urine. This tech removed brief. Bed chux pad was changed and brief was changed. This tech and Vance Gather NT assisted pt with peri care in bathroom. Pt was assisted back into bed.

## 2023-08-27 NOTE — ED Notes (Signed)
Hospital meal provided.  100% consumed, pt tolerated w/o complaints.  Waste discarded appropriately.

## 2023-08-27 NOTE — ED Notes (Signed)
Pt received snack and drink

## 2023-08-27 NOTE — ED Notes (Signed)
IVC/  PENDING  PLACEMENT

## 2023-08-28 DIAGNOSIS — E876 Hypokalemia: Secondary | ICD-10-CM | POA: Diagnosis not present

## 2023-08-28 LAB — BASIC METABOLIC PANEL
Anion gap: 8 (ref 5–15)
BUN: 27 mg/dL — ABNORMAL HIGH (ref 8–23)
CO2: 25 mmol/L (ref 22–32)
Calcium: 9.1 mg/dL (ref 8.9–10.3)
Chloride: 107 mmol/L (ref 98–111)
Creatinine, Ser: 1.49 mg/dL — ABNORMAL HIGH (ref 0.61–1.24)
GFR, Estimated: 45 mL/min — ABNORMAL LOW (ref >60)
Glucose, Bld: 103 mg/dL — ABNORMAL HIGH (ref 70–99)
Potassium: 3.8 mmol/L (ref 3.5–5.1)
Sodium: 140 mmol/L (ref 135–145)

## 2023-08-28 NOTE — ED Notes (Signed)
Pt was provided with a snack.

## 2023-08-28 NOTE — ED Notes (Signed)
 Pt provided with breakfast tray.

## 2023-08-28 NOTE — ED Notes (Signed)
Patient assisted to restroom with one assist and walker. Patient confused, but able to be redirected. Fresh brief placed, as patient had already been incontinent of urine prior to being able to make it to restroom.

## 2023-08-28 NOTE — ED Notes (Signed)
This tech called Lab to obtain pt

## 2023-08-28 NOTE — ED Provider Notes (Signed)
Emergency Medicine Observation Re-evaluation Note  Hunter [name] is a 89 y.o. male, seen on rounds today.  Pt initially presented to the ED for complaints of Psychiatric Evaluation  Currently, the patient is is no acute distress. Denies any concerns at this time.  Physical Exam  Blood pressure (!) 143/60, pulse 75, temperature 99.2 F (37.3 C), temperature source Oral, resp. rate 20, height 6' (1.829 m), weight 97.5 kg, SpO2 90%.  Physical Exam: General: No apparent distress Pulm: Normal WOB Neuro: Moving all extremities Psych: Resting comfortably     ED Course / MDM   Clinical Course as of 08/28/23 0724  Tue Aug 24, 2023  0336 Laboratory results notable for mild hypokalemia, decreasing troponins with unremarkable EKG.  Will replete potassium orally.  Encouraged patient to hydrate orally.  At this time patient is medically cleared for psychiatric evaluation and disposition. [JS]    Clinical Course User Index [JS] Hunter Hong, MD    I have reviewed the labs performed to date as well as medications administered while in observation.  Recent changes in the last 24 hours include: No acute events overnight.  Plan   Current plan: Patient awaiting psychiatric disposition. Patient is under full IVC at this time.    Hunter [name], Hunter Maw, DO 08/28/23 [phone]

## 2023-08-28 NOTE — ED Notes (Signed)
Pts brief changed by this RN and Eliberto Ivory EDT

## 2023-08-28 NOTE — ED Notes (Signed)
 Hospital meal provided, pt tolerated w/o complaints.  Waste discarded appropriately.

## 2023-08-28 NOTE — ED Notes (Signed)
Pt yelling out in the hallway. This RN went over to ask what the patient needed. Pt asking why the officer at the desk is not speaking to him, pt states that the office is just sitting there. Pt informed that the officers job was to sit at the desk to ensure the safety of everyone. Pt asking when he is going to be able to leave. Pt was informed that we are waiting to hear back on placement. Pt informed that he did not need to yell to get staffs attention. Pt informed that I can see him from the nurses station and if he needs anything to just flag myself or the tech down and we are happy to help him.

## 2023-08-28 NOTE — ED Notes (Addendum)
Changed pt brief and under pads

## 2023-08-28 NOTE — ED Notes (Signed)
Report from [facility]. Pt IVC and pending inpatient admission. Pt is Sleeping at this time and is in NAD.

## 2023-08-28 NOTE — ED Notes (Signed)
 Pt given meal tray.

## 2023-08-29 DIAGNOSIS — E876 Hypokalemia: Secondary | ICD-10-CM | POA: Diagnosis not present

## 2023-08-29 NOTE — ED Notes (Signed)
Pt was provided with dinner tray

## 2023-08-29 NOTE — ED Notes (Signed)
Patient given eye mask for comfort

## 2023-08-29 NOTE — ED Notes (Signed)
Ivc /pending placement /IVC Papers to be re-newed before 21:09 pm

## 2023-08-29 NOTE — ED Provider Notes (Signed)
Emergency Medicine Observation Re-evaluation Note  Hunter [name] is a 89 y.o. male, seen on rounds today.  Pt initially presented to the ED for complaints of Psychiatric Evaluation Currently, the patient is resting.  Physical Exam  BP (!) 102/48 (BP Location: Left Arm)   Pulse 66   Temp 98 F (36.7 C) (Oral)   Resp 18   Ht 1.829 m (6')   Wt 97.5 kg   SpO2 94%   BMI 29.16 kg/m  Physical Exam Gen:  No acute distress Resp:  Breathing easily and comfortably, no accessory muscle usage Neuro:  Moving all four extremities, no gross focal neuro deficits Psych:  Resting currently, calm when awake  ED Course / MDM    I have reviewed the labs performed to date as well as medications administered while in observation.  Recent changes in the last 24 hours include no significant changes.  Plan  Current plan is for psych disposition.    Loleta Rose, MD 08/29/23 [phone]

## 2023-08-29 NOTE — ED Notes (Signed)
This tech assisted pt to bathroom and changed pts brief. This tech also changed out pts chuck pads on bed.

## 2023-08-29 NOTE — ED Notes (Signed)
pt recieved snack and drink 

## 2023-08-30 DIAGNOSIS — E876 Hypokalemia: Secondary | ICD-10-CM | POA: Diagnosis not present

## 2023-08-30 NOTE — ED Provider Notes (Signed)
Emergency Medicine Observation Re-evaluation Note  Hunter [name] is a 89 y.o. male, seen on rounds today.  Pt initially presented to the ED for complaints of Psychiatric Evaluation   Physical Exam  BP 118/84   Pulse 69   Temp 97.9 F (36.6 C) (Oral)   Resp 17   Ht 6' (1.829 m)   Wt 97.5 kg   SpO2 96%   BMI 29.16 kg/m  Physical Exam General: nad  ED Course / MDM  EKG:EKG Interpretation Date/Time:  Tuesday August 24 2023 00:56:29 EST Ventricular Rate:  68 PR Interval:  202 QRS Duration:  200 QT Interval:  484 QTC Calculation: 514 R Axis:   -69  Text Interpretation: Atrial-sensed ventricular-paced rhythm Abnormal ECG When compared with ECG of 23-Jan-2023 17:47, PREVIOUS ECG IS PRESENT Confirmed by UNCONFIRMED, DOCTOR ([id number]), editor Lonell Face [phone]) on 08/24/2023 7:41:44 AM  I have reviewed the labs performed to date as well as medications administered while in observation.    Plan  Current plan is for psych.    Willy Eddy, MD 08/30/23 [phone]

## 2023-08-30 NOTE — ED Notes (Signed)
PT NOW  VOL  PER  DR  Marisa Severin  MD

## 2023-08-30 NOTE — ED Notes (Signed)
IVC/ Pending placement/ Papers to be renewed before 2109

## 2023-08-30 NOTE — ED Provider Notes (Signed)
-----------------------------------------   1:05 PM on 08/30/2023 -----------------------------------------  I was asked to review whether this patient requires his IVC to be renewed.  On review of the chart, the most recent psychiatry note from NP Saucier from 2/12 indicates that the patient is on voluntary status and should be placed under IVC only if he tries to leave the hospital.  Therefore I will let the IVC expire today.   Dionne Bucy, MD 08/30/23 1306

## 2023-08-30 NOTE — ED Notes (Addendum)
Patient yelling while staff was attmepting to help another patient. Patient calling this RN "fuck face". Patient continues to go on rants about subjects not relevant to questioning by staff. Patient continues to escalate depsite this RN and multiple other staff members attempt at de-escalation. PRN medication given- reference MAR. Patient allowed this RN to give IM injection voluntarily- no manual hold required.

## 2023-08-30 NOTE — ED Notes (Signed)
Patient continues to rant about "[place] and "people trying to take his money". Patient continues to use cuss words in regards to staff members that walk by patient.

## 2023-08-30 NOTE — ED Notes (Signed)
VOL  PENDING  PLACEMENT 

## 2023-08-31 ENCOUNTER — Ambulatory Visit: Payer: Medicare Other | Attending: Nurse Practitioner | Admitting: Nurse Practitioner

## 2023-08-31 DIAGNOSIS — F03918 Unspecified dementia, unspecified severity, with other behavioral disturbance: Secondary | ICD-10-CM | POA: Diagnosis not present

## 2023-08-31 DIAGNOSIS — E876 Hypokalemia: Secondary | ICD-10-CM | POA: Diagnosis not present

## 2023-08-31 NOTE — ED Notes (Signed)
 Dinner tray provided for pt

## 2023-08-31 NOTE — ED Notes (Signed)
EDP K. Paduchowski notified of holding pt BP meds today due to BP reading.
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
## 2023-08-31 NOTE — ED Notes (Signed)
Patient received Hospital lunch tray.

## 2023-08-31 NOTE — ED Notes (Signed)
[place] DSS called for update on patient.

## 2023-08-31 NOTE — ED Notes (Signed)
Pt given 2 warm blankets

## 2023-08-31 NOTE — ED Notes (Signed)
VOL  PENDING  PLACEMENT 
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
## 2023-08-31 NOTE — ED Notes (Signed)
Patient continues to call other patients and staff "a bunch of dummies and morons", despite many redirections.

## 2023-08-31 NOTE — ED Provider Notes (Signed)
Emergency Medicine Observation Re-evaluation Note  Hunter [name] is a 89 y.o. male, seen on rounds today.  Pt initially presented to the ED for complaints of Psychiatric Evaluation Currently, the patient is resting, voices no medical complaints.  Physical Exam  BP (!) 101/48 (BP Location: Left Arm)   Pulse 69   Temp 97.6 F (36.4 C) (Oral)   Resp 18   Ht 6' (1.829 m)   Wt 97.5 kg   SpO2 93%   BMI 29.16 kg/m  Physical Exam General: Resting in no acute distress Cardiac: No cyanosis Lungs: Equal rise and fall Psych: Not agitated  ED Course / MDM  EKG:EKG Interpretation Date/Time:  Tuesday August 24 2023 00:56:29 EST Ventricular Rate:  68 PR Interval:  202 QRS Duration:  200 QT Interval:  484 QTC Calculation: 514 R Axis:   -69  Text Interpretation: Atrial-sensed ventricular-paced rhythm Abnormal ECG When compared with ECG of 23-Jan-2023 17:47, PREVIOUS ECG IS PRESENT Confirmed by UNCONFIRMED, DOCTOR ([id number]), editor Lonell Face ([id number]) on 08/24/2023 7:41:44 AM  I have reviewed the labs performed to date as well as medications administered while in observation.  Recent changes in the last 24 hours include no events overnight.  Plan  Current plan is for psychiatric disposition.    Irean Hong, MD 08/31/23 [phone]

## 2023-08-31 NOTE — ED Notes (Signed)
 Hospital meal provided.  100% consumed, pt tolerated w/o complaints.  Waste discarded appropriately.

## 2023-08-31 NOTE — ED Notes (Signed)
Patient yelling due to ems bay doors continually opening. Patient confused on location, calling everyone "idiots".

## 2023-08-31 NOTE — ED Notes (Signed)
Patient urinated on floor while this RN and officer were attempting to walk patient to bathroom. Peri-care performed by NT and new socks and pad applied to bed. Patient escorted back to bed by this RN and officer. Patient requiring 1 person assistance for ambulation.

## 2023-08-31 NOTE — ED Notes (Signed)
Breakfast tray provided to pt.

## 2023-08-31 NOTE — ED Notes (Signed)
Patient singing loudly about his "dick" and "sticking it in females". Patient redirected by this RN and clenched fist and said "shut the hell up".

## 2023-08-31 NOTE — BH Assessment (Signed)
 Referral information for Psychiatric Hospitalization RE-faxed to;   [facility] [phone]) No available beds  [facility] [phone]),   [facility] [phone]) [phone]  [facility] Pl[facility] [phone]),   [facility] [phone] -[phone]- [phone]),    [facility] [phone]),  [place] [phone])  [place] [phone] or [phone]),   [facility] [phone])

## 2023-08-31 NOTE — ED Notes (Signed)
Pts brief changed and pt cleansed of incontinence.

## 2023-08-31 NOTE — Progress Notes (Deleted)
 Cardiology Office Note:    Date:  11/19/2022  ID:  Hunter [name], DOB 1934/06/05, MRN [id number]  PCP:  Shelva Majestic, MD   [facility] Providers Cardiologist:  Dina Rich, MD Electrophysiologist:  Maurice Small, MD     Referring MD: Shelva Majestic, MD   CC:   History of Present Illness:    Hunter [name] is a 89 y.o. male with a hx of the following:  HFpEF CHB, s/p PPM Chronic left bundle branch block Hypertension Hyperlipidemia Chronic kidney disease stage III  Patient is a very pleasant 89 year old male with past medical history as mentioned above.  Last seen by Dr. Dina Rich on April 17, 2022.  Patient reported taking Lasix about 2-3 times per week, had some ongoing leg edema as well as ongoing shortness of breath that had progressed.  Weight was up 6 pounds since April 2023.  Was compliant with his medications.  He had some for signs of fluid overload, Dr. Dina Rich recommended better compliance of Lasix.  Was referred to lymphedema clinic.  Labs are normal.  Was told to follow-up in 6 months.  Patient came into the office this morning reporting chest pain and shortness of breath.  Nursing staff reported that symptoms were ongoing x 1 week, shortness of breath having ongoing for around 10 days, seemed to be getting worse.  Pain was located all across rib cage, he noted that he had been moving furniture for his wife.  Cough was reported as going on for 2 weeks, nonproductive.  Case was discussed with Dr. Wyline Mood and suggested office evaluation for today.  I last saw him on 09/01/2022. He denied chest pain but admitted to bilateral rib cage pain and along lateral sides of abdomen, admitted to shortness of breath noted recently. Denied any chest pain, palpitations, syncope, presyncope, dizziness, orthopnea, PND, significant weight changes, acute bleeding, or claudication. Also admitted to increased swelling along lower extremities recently.  Friend stated she believes his pain is due to the fact that his furniture is set low in his house. Denied tobacco use. Echo updated - report noted below.   Today he presents for follow-up with his friend. Says he fell after seeing me while at another office visit for another doctor. Said floor was slippery while using the bathroom, says he is prone to falling. Denies any injuries. Endorses chronic leg swelling, foot pain, and incontinence issues, has been ongoing for a while per his report. Denies any chest pain, shortness of breath, palpitations, syncope, presyncope, dizziness, orthopnea, PND, significant weight changes, acute bleeding, or claudication.   Past Medical History:  Diagnosis Date   Allergic rhinitis 02/02/2007   Anemia    Arthritis    "knees" (02/08/2017)   Chronic lower back pain    CKD (chronic kidney disease), stage III (HCC) 05/27/2011   Complete heart block 02/08/2017   Eczema    Essential hypertension 02/02/2007   Lasix 60 mg, losartan 50mg ,  Amlodipine 5mg --> 2.5 mg.  Usually have to repeat BP measurements  In the past-Maxzide 37.5-25mg .   GERD (gastroesophageal reflux disease) occasional   Hearing loss of both ears    Heart failure with preserved ejection fraction (HFpEF) 12/06/2013   History of skin cancer 03/26/2014   nose    Hx of echocardiogram    Echo (07/2013): Mild LVH, EF 55-60%, grade 1 diastolic dysfunction, mild LAE, PASP 23   Hyperlipidemia    Hypertension    Hypothyroidism    LBBB (  left bundle branch block)    Left patella fracture 2018   "no OR" (02/08/2017)   Major depression in partial remission 02/02/2007   Amitriptyline 25mg  for sleep (stop when runs out of #10 04/2018(, zoloft 100-->150mg --> 200mg    Mild neurocognitive disorder 06/21/2019   Neuropathy (HCC) 11/02/2011   Nocturia    Peripheral vascular disease (HCC) LOWER EXTREMITIES   Spinal stenosis in cervical region 03/21/2010   Squamous cell skin cancer, penis: glans (HCC) 05/2010   Initial  excision 11/11; recurrence, excision and laser Rx 9/13   Thrombocytopenia (HCC) 05/23/2012   Urinary frequency 09/05/2009   Possible BPH- see Artist Pais notes    Vitamin D deficiency 08/15/2008    Past Surgical History:  Procedure Laterality Date   CIRCUMCISION/ LASER DISSECTION PENILE GLANS CANCER  06-02-2010   CYSTOSCOPY WITH URETHRAL DILATATION  03/28/2012   Procedure: CYSTOSCOPY WITH URETHRAL DILATATION;  Surgeon: Kathi Ludwig, MD;  Location: [facility];  Service: Urology;  Laterality: N/A;  excision biopsy extensive meatal penile carcinoma meatoplasty   EXCISION RIGHT WRIST BENIGN TUMOR  2002 (APPROX)   hip surgery     INCISION AND DRAINAGE DEEP NECK ABSCESS     INGUINAL HERNIA REPAIR  1970's   KNEE ARTHROSCOPY Right 2017   LUMBAR LAMINECTOMY/DECOMPRESSION MICRODISCECTOMY N/A 05/24/2020   Procedure: OPEN LAMINECTOMY LUMBAR THREE-LUMBAR FOUR;  Surgeon: Bethann Goo, DO;  Location: [facility] OR;  Service: Neurosurgery;  Laterality: N/A;   PACEMAKER IMPLANT N/A 02/08/2017   Procedure: Pacemaker Implant;  Surgeon: Duke Salvia, MD;  Location: [facility] INVASIVE CV LAB;  Service: Cardiovascular;  Laterality: N/A;   PACEMAKER IMPLANT  02/08/2017   SJM Assurity MRI dual chamber PPM implanted by Dr Graciela Husbands for complete heart block   PENILE BX  05-09-2010   TONSILLECTOMY     TRANSURETHRAL RESECTION OF PROSTATE N/A 05/10/2015   Procedure: CYSTOSCOPY, URETHRAL MEATAL DILATION, TRANSURETHRAL RESECTION OF THE PROSTATE (TURP);  Surgeon: Jethro Bolus, MD;  Location: [facility] ORS;  Service: Urology;  Laterality: N/A;    Current Medications: No outpatient medications have been marked as taking for the 08/31/23 encounter (Appointment) with Sharlene Dory, NP.     Allergies:   Patient has no known allergies.   Social History   Socioeconomic History   Marital status: Divorced    Spouse name: Not on file   Number of children: 2   Years of education: 16   Highest education level: Bachelor's  degree (e.g., BA, AB, BS)  Occupational History   Occupation: Retired  Tobacco Use   Smoking status: Never    Passive exposure: Never   Smokeless tobacco: Never   Tobacco comments:    Verified by Andrey Spearman  Vaping Use   Vaping status: Never Used  Substance and Sexual Activity   Alcohol use: Yes    Comment: 02/08/2017 "nothing in the 2000s"   Drug use: No   Sexual activity: Not Currently  Other Topics Concern   Not on file  Social History Narrative   Lives alone. Has a cat and a dog      Ambulates with cane      Right handed      Former IT trainer   Social Drivers of Health   Financial Resource Strain: Low Risk  (06/13/2023)   Received from [facility]   Overall Financial Resource Strain (CARDIA)    Difficulty of Paying Living Expenses: Not hard at all  Food Insecurity: No Food Insecurity (06/13/2023)   [facility] from [facility]   Hunger Vital Sign    Worried About Running Out of Food in the Last Year: Never true    Ran Out of Food in the Last Year: Never true  Transportation Needs: No Transportation Needs (06/13/2023)   Received from [facility] - Transportation    Lack of Transportation (Medical): No    Lack of Transportation (Non-Medical): No  Physical Activity: Insufficiently Active (06/13/2023)   Received from [facility]   Exercise Vital Sign    Days of Exercise per Week: 3 days    Minutes of Exercise per Session: 10 min  Stress: No Stress Concern Present (06/13/2023)   Received from [facility] - Occupational Stress Questionnaire    Feeling of Stress : Not at all  Social Connections: Moderately Integrated (06/13/2023)   Received from [facility]   Social Connection and Isolation Panel [NHANES]    Frequency of Communication with Friends and Family: Three times a week    Frequency of Social Gatherings with Friends and Family: Three times a week    Attends Religious Services: 1 to 4 times  per year    Active Member of Clubs or Organizations: No    Attends Banker Meetings: Never    Marital Status: Married     Family History: The patient's family history includes Arthritis in an other family member; Hyperlipidemia in an other family member; Hypertension in an other family member; Lung cancer in his mother; Prostate cancer in an other family member.  ROS:   Please see the history of present illness.     All other systems reviewed and are negative.  EKGs/Labs/Other Studies Reviewed:    The following studies were reviewed today:   EKG:  EKG is not ordered today.   Echo 11/2022:  1. Left ventricular ejection fraction, by estimation, is 50 to 55%. The  left ventricle has low normal function. The left ventricle has no regional  wall motion abnormalities. Left ventricular diastolic parameters are  consistent with Grade I diastolic  dysfunction (impaired relaxation). The average left ventricular global  longitudinal strain is -19.2 %. The global longitudinal strain is normal.   2. Right ventricular systolic function is normal. The right ventricular  size is normal. Tricuspid regurgitation signal is inadequate for assessing  PA pressure.   3. Left atrial size was upper normal.   4. The mitral valve is grossly normal. Mild mitral valve regurgitation.   5. The aortic valve is tricuspid. Aortic valve regurgitation is not  visualized. No aortic stenosis is present. Aortic valve mean gradient  measures 5.0 mmHg.   6. The inferior vena cava is normal in size with greater than 50%  respiratory variability, suggesting right atrial pressure of 3 mmHg.   Comparison(s): Prior images unable to be directly viewed, comparison made  by report only. Echocardiogram done 05/26/18 showed an EF of 50%.  Echocardiogram on 05/26/2018: Study Conclusions   - Left ventricle: The cavity size was normal. Wall thickness was    normal. The estimated ejection fraction was 50%. Septal  motion    suggestive of LBBB or RV pacing. Doppler parameters are    consistent with abnormal left ventricular relaxation (grade 1    diastolic dysfunction).  - Aortic valve: Mildly calcified annulus. Trileaflet.  - Mitral valve: Mildly calcified annulus. There was mild    regurgitation.  - Right ventricle: Pacer wire or catheter noted in right ventricle.  -  Right atrium: Central venous pressure (est): 3 mm Hg.  - Atrial septum: No defect or patent foramen ovale was identified.  - Tricuspid valve: There was trivial regurgitation.  - Pulmonary arteries: PA peak pressure: 24 mm Hg (S).  - Pericardium, extracardiac: There was no pericardial effusion.  Recent Labs: 01/23/2023: B Natriuretic Peptide 124.0 01/25/2023: Magnesium 2.2 02/05/2023: TSH 2.38 08/23/2023: ALT 25; Hemoglobin 13.6; Platelets 134 08/28/2023: BUN 27; Creatinine, Ser 1.49; Potassium 3.8; Sodium 140  Recent Lipid Panel    Component Value Date/Time   CHOL 146 02/05/2023 1553   TRIG 158 (H) 02/05/2023 1553   TRIG 128 06/01/2006 1144   HDL 35 (L) 02/05/2023 1553   CHOLHDL 4.2 02/05/2023 1553   VLDL 23.4 07/01/2021 1129   LDLCALC 86 02/05/2023 1553   LDLDIRECT 127.0 01/03/2019 1153    Physical Exam:    VS:  There were no vitals taken for this visit.    Wt Readings from Last 3 Encounters:  08/23/23 215 lb (97.5 kg)  05/31/23 229 lb 9.6 oz (104.1 kg)  02/05/23 224 lb 3.2 oz (101.7 kg)     GEN: Obese, 89 y.o. male in no acute distress HEENT: Normal NECK: No JVD; No carotid bruits CARDIAC: S1/S2, RRR, no murmurs, rubs, gallops; 2+ pulses RESPIRATORY:  Clear to auscultation without rales, wheezing or rhonchi  MUSCULOSKELETAL:  Nonpitting bilateral lower extremity edema/lymphedema (L>R) with evidence of pedal edema, skin scaling.  SKIN: Warm and dry Skin redness along bottom of feet along pressure points. NEUROLOGIC:  Alert and oriented x 3 PSYCHIATRIC:  Normal affect, distracted  ASSESSMENT:    No diagnosis  found.   PLAN:    In order of problems listed above:  HFpEF, leg/pedal edema/lymphedema Stage C, Class II-III symptoms. Etiology unclear. TTE 11/2022 showed EF 50-55%. Weight is stable, but does note  lower extremity swelling (lymphedema on exam/pedal edema)- see PE above. Did suggest increasing Lasix dosage to improve leg edema, however pt declines due to incontinence issues - see below. Continue Lasix and losartan.  Low sodium diet, fluid restriction <2L, and daily weights encouraged. Educated to contact our office for weight gain of 2 lbs overnight or 5 lbs in one week. Will refer to home health.   CHB, s/p PPM Remote device check 10/2022 showed normal device function.  Continue to follow up with EP.   HTN BP stable. Discussed to monitor BP at home at least 2 hours after medications and sitting for 5-10 minutes. Continue current medication regimen. Heart healthy diet and regular cardiovascular exercise encouraged.   HLD, medication management No recent lipid panel on file. Managed by PCP. Will obtain FLP and LFT to be done in 2-3 weeks. Continue atorvastatin. Heart healthy diet and regular cardiovascular exercise encouraged.   CKD stage 3 Most recent sCr was 1.37 with eGFR 50. Avoid nephrotoxic agents. Encouraged adequate hydration. Continue to follow with PCP.  6. History of falls, urinary incontinence Says he is prone to falling, denies any injuries/fractures. Says urinary incontinence affects his ADL's. Discussed with patient that I strongly believe he would greatly benefit from [facility] RN/CNA, PT/OT. He verbalized understanding and is agreeable to this referral.   7. Foot pain Admits to foot pain while wearing shoes. Skin redness noted along bottom of feet along pressure points. Will refer to podiatry.   8. Disposition: Follow-up with Dr. Dina Rich or APP in 6 months or sooner if anything changes.   Medication Adjustments/Labs and Tests Ordered: Current medicines are  reviewed at  length with the patient today.  Concerns regarding medicines are outlined above.  No orders of the defined types were placed in this encounter.  No orders of the defined types were placed in this encounter.   There are no Patient Instructions on file for this visit.   Signed, Sharlene Dory, NP  08/31/2023 12:52 PM    [facility]

## 2023-08-31 NOTE — ED Notes (Signed)
Assisted pt to restroom, new brief placed on pt.

## 2023-09-01 DIAGNOSIS — E876 Hypokalemia: Secondary | ICD-10-CM | POA: Diagnosis not present

## 2023-09-01 MED ORDER — LORAZEPAM 1 MG PO TABS
1.0000 mg | ORAL_TABLET | Freq: Once | ORAL | Status: AC
  Administered 2023-09-01: 1 mg via ORAL
  Filled 2023-09-01: qty 1

## 2023-09-01 NOTE — ED Notes (Signed)
VOL/Pending Placement 

## 2023-09-01 NOTE — ED Notes (Signed)
Pt walking with mobility specialist at this time.

## 2023-09-01 NOTE — ED Notes (Signed)
This RN and EDT gave pt shower. Pt had large BM during shower. Pt linens changed.

## 2023-09-01 NOTE — ED Notes (Signed)
VOL papers let to expire and not renewed per Dr. Marisa Severin

## 2023-09-01 NOTE — ED Notes (Signed)
Snack given by EDT.

## 2023-09-01 NOTE — BH Assessment (Signed)
 Referral information for Psychiatric Hospitalization RE-faxed to;   [facility] [phone]) No available beds  [facility] [phone]),   [facility] [phone]) [phone]  [facility] Pl[facility] [phone]),   [facility] [phone] -[phone]- [phone]),    [facility] [phone]),  [place] [phone])  [place] [phone] or [phone]),   [facility] [phone])

## 2023-09-01 NOTE — ED Provider Notes (Signed)
Emergency Medicine Observation Re-evaluation Note  Hunter [name] is a 89 y.o. male, seen on rounds today.  Pt initially presented to the ED for complaints of Psychiatric Evaluation Currently, the patient is resting, voices no medical complaints.  Physical Exam  BP (!) 117/53 (BP Location: Left Arm)   Pulse 67   Temp 98.7 F (37.1 C) (Oral)   Resp 17   Ht 6' (1.829 m)   Wt 97.5 kg   SpO2 96%   BMI 29.16 kg/m  Physical Exam General: Resting in no acute distress Cardiac: No cyanosis Lungs: Equal rise and fall Psych: Not agitated  ED Course / MDM  EKG:EKG Interpretation Date/Time:  Tuesday August 24 2023 00:56:29 EST Ventricular Rate:  68 PR Interval:  202 QRS Duration:  200 QT Interval:  484 QTC Calculation: 514 R Axis:   -69  Text Interpretation: Atrial-sensed ventricular-paced rhythm Abnormal ECG When compared with ECG of 23-Jan-2023 17:47, PREVIOUS ECG IS PRESENT Confirmed by UNCONFIRMED, DOCTOR ([id number]), editor Lonell Face ([id number]) on 08/24/2023 7:41:44 AM  I have reviewed the labs performed to date as well as medications administered while in observation.  Recent changes in the last 24 hours include no events overnight.  Plan  Current plan is for psychiatric disposition.    Irean Hong, MD 09/01/23 [phone]

## 2023-09-01 NOTE — ED Notes (Signed)
Pt assisted to BR by this RN. New brief provided to pt.

## 2023-09-01 NOTE — ED Notes (Addendum)
Pt currently sleeping will obtain vitals and give snack when pt awakens

## 2023-09-01 NOTE — ED Notes (Signed)
 Hospital meal provided.  100% consumed, pt tolerated w/o complaints.  Waste discarded appropriately.

## 2023-09-01 NOTE — ED Notes (Signed)
Pt assisted to the bathroom by this tech. Pt urinated in toilet. New brief was put on pt by this tech. Pt was assisted back to bed. Pt resting in bed at this time.

## 2023-09-02 DIAGNOSIS — E876 Hypokalemia: Secondary | ICD-10-CM | POA: Diagnosis not present

## 2023-09-02 MED ORDER — ACETAMINOPHEN 500 MG PO TABS
1000.0000 mg | ORAL_TABLET | Freq: Once | ORAL | Status: AC
  Administered 2023-09-02: 1000 mg via ORAL
  Filled 2023-09-02: qty 2

## 2023-09-02 NOTE — ED Notes (Signed)
 Pt provided with breakfast tray.

## 2023-09-02 NOTE — ED Notes (Signed)
 Hospital meal provided, pt tolerated w/o complaints.  Waste discarded appropriately.

## 2023-09-02 NOTE — ED Notes (Signed)
This tech assisted pt with a shower. Pt required minimal help and was able to bathe himself. Pt was also able to brush his own teeth.

## 2023-09-02 NOTE — ED Notes (Signed)
Patient's gown and blankets found to be wet. This RN disposed of patient's soiled gown and blankets. This RN placed patient in a new gown and gave him 2 warm blankets. Patient returned to bed in a comfortable position.

## 2023-09-02 NOTE — ED Notes (Signed)
 vol/pending placement.Marland Kitchen

## 2023-09-02 NOTE — ED Notes (Signed)
 Patient provided snack at appropriate snack time.  Pt consumed 100% of snack provided, tolerated well w/o complaints   Trash disposted of appropriately by patient.

## 2023-09-02 NOTE — BH Assessment (Signed)
TTS continues to look for psych inpatient bed but is unable to secure one at this time. He has been denied due to the dementia dx and history of aggression.

## 2023-09-02 NOTE — ED Notes (Signed)
Patient agitated during day shift per RN report. Patient has been sleeping since 8pm- will continue to let patient sleep due to prior agitation and give medications once patient awakens. Respirations even and unlabored at this time.

## 2023-09-02 NOTE — ED Provider Notes (Signed)
Emergency Medicine Observation Re-evaluation Note  Physical Exam   BP (!) 110/53 (BP Location: Right Arm)   Pulse 65   Temp (!) 97.5 F (36.4 C) (Oral)   Resp 18   Ht 6' (1.829 m)   Wt 97.5 kg   SpO2 97%   BMI 29.16 kg/m   Patient appears in no acute distress.  ED Course / MDM   No reported events during my shift at the time of this note.   Pt is awaiting dispo from consultants   Pilar Jarvis MD    Pilar Jarvis, MD 09/02/23 [phone]

## 2023-09-02 NOTE — ED Notes (Signed)
VOL  PENDING  PLACEMENT 

## 2023-09-03 DIAGNOSIS — E876 Hypokalemia: Secondary | ICD-10-CM | POA: Diagnosis not present

## 2023-09-03 DIAGNOSIS — F03918 Unspecified dementia, unspecified severity, with other behavioral disturbance: Secondary | ICD-10-CM

## 2023-09-03 MED ORDER — LORAZEPAM 2 MG PO TABS
2.0000 mg | ORAL_TABLET | Freq: Four times a day (QID) | ORAL | Status: DC | PRN
  Administered 2023-09-05 – 2023-09-23 (×10): 2 mg via ORAL
  Filled 2023-09-03 (×11): qty 1

## 2023-09-03 NOTE — ED Provider Notes (Signed)
Emergency Medicine Observation Re-evaluation Note  Physical Exam   BP (!) 111/94   Pulse 72   Temp 98.6 F (37 C) (Oral)   Resp 18   Ht 6' (1.829 m)   Wt 97.5 kg   SpO2 97%   BMI 29.16 kg/m   Patient appears in no acute distress.  ED Course / MDM   No reported events during my shift at the time of this note.   Pt is awaiting dispo from consultants   Pilar Jarvis MD    Pilar Jarvis, MD 09/03/23 [phone]

## 2023-09-03 NOTE — ED Notes (Signed)
 Hospital meal provided.  100% consumed, pt tolerated w/o complaints.  Waste discarded appropriately.

## 2023-09-03 NOTE — ED Notes (Addendum)
[facility] contacted the [facility] and [employer] [phone]) and spoke to [place], Passenger transport manager.  [facility] advised of patient's possible discharge today and inquired if patient would be able to return to the facility.  [facility] stated due to the number of events where patient displayed highly aggressive behavior, they are declining to accept patient back.   [facility] also spoke to Legal Guardian, Chance Chalmers Guest with [place] [facility] [phone]). Chance is aware patient will not be returning to [facility].  Chance feels it will be very hard to place patient due to patient's level of aggressive behaviors with no inpatient treatment.  Graylon Good, [facility]
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
## 2023-09-03 NOTE — Progress Notes (Signed)
   09/03/23 0900  Spiritual Encounters  Type of Visit Initial  Care provided to: Patient  Conversation partners present during encounter Nurse  Reason for visit Routine spiritual support  OnCall Visit No   Chaplain visited patient while rounding in the ED.  Patient shared stories form his life that were meaningful to him.  Chaplain offered a compassionate presence, reflective listening and empathy.    Rev. Rana M. Earlene Plater, MDiv Chaplain Resident  [facility]
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
## 2023-09-03 NOTE — ED Notes (Signed)
VOL/Pending Placement 

## 2023-09-03 NOTE — BH Assessment (Signed)
Referral information for Psychiatric Hospitalization RE-faxed to;    [facility] [phone]) No available beds   [facility] [phone]),    [facility] [phone]) [phone]   [facility] [phone]),  [facility] [phone] -or- [phone]),    [facility] [phone] or [phone]),

## 2023-09-03 NOTE — ED Notes (Signed)
Patient had soiled brief. 2 person assist required to stand while NT performed peri-care and changed patient brief and gown at this time. Patient yelling at staff throughout.

## 2023-09-03 NOTE — ED Notes (Signed)
VOL/pending placement 

## 2023-09-03 NOTE — ED Notes (Signed)
Pt ambulated to toilet and back to bed. Pt sheets and brief saturated with urine. Pt placed in clean brief and bed sheets changed.

## 2023-09-04 DIAGNOSIS — E876 Hypokalemia: Secondary | ICD-10-CM | POA: Diagnosis not present

## 2023-09-04 NOTE — ED Notes (Signed)
 Pt provided with dinner tray. Pt is sitting up eating.

## 2023-09-04 NOTE — ED Notes (Signed)
 This NT assisted pt with changing brief and walking back to bed. Pt is now resting comfortably in bed.

## 2023-09-04 NOTE — ED Notes (Signed)
 Pt assisted to bathroom to void bladder.  Pt brief, gown, underwear, and bed linens changed.  RN assisted pt in performing pericare.

## 2023-09-04 NOTE — ED Notes (Signed)
 This NT provided pt with pm snack.

## 2023-09-04 NOTE — ED Notes (Signed)
 vol/pending placement.Marland Kitchen

## 2023-09-04 NOTE — ED Notes (Signed)
 Patient meal tray provided

## 2023-09-04 NOTE — ED Provider Notes (Signed)
 Emergency Medicine Observation Re-evaluation Note  Hunter [name] is a 89 y.o. male, seen on rounds today.  Pt initially presented to the ED for complaints of Psychiatric Evaluation Currently, the patient is resting, voices no medical complaints.  Physical Exam  BP (!) 116/53   Pulse 61   Temp 98.8 F (37.1 C) (Oral)   Resp 18   Ht 6' (1.829 m)   Wt 97.5 kg   SpO2 95%   BMI 29.16 kg/m  Physical Exam General: Resting in no acute distress Cardiac: No cyanosis Lungs: Equal rise and fall Psych: Not agitated  ED Course / MDM  EKG:EKG Interpretation Date/Time:  Tuesday August 24 2023 00:56:29 EST Ventricular Rate:  68 PR Interval:  202 QRS Duration:  200 QT Interval:  484 QTC Calculation: 514 R Axis:   -69  Text Interpretation: Atrial-sensed ventricular-paced rhythm Abnormal ECG When compared with ECG of 23-Jan-2023 17:47, PREVIOUS ECG IS PRESENT Confirmed by UNCONFIRMED, DOCTOR ([id number]), editor Lonell Face ([id number]) on 08/24/2023 7:41:44 AM  I have reviewed the labs performed to date as well as medications administered while in observation.  Recent changes in the last 24 hours include no events overnight.  Plan  Current plan is for psychiatric disposition.    Irean Hong, MD 09/04/23 [phone]

## 2023-09-04 NOTE — BH Assessment (Signed)
 TTS continues to look for psych inpatient bed but is unable to secure one at this time.

## 2023-09-04 NOTE — ED Notes (Signed)
 Assisted pt to the bathroom with walker. Changed pt brief to a clean one. Discarded of dirty brief.

## 2023-09-04 NOTE — ED Notes (Signed)
Patient is vol pending placement 

## 2023-09-05 DIAGNOSIS — E876 Hypokalemia: Secondary | ICD-10-CM | POA: Diagnosis not present

## 2023-09-05 NOTE — ED Notes (Signed)
 Pt sleeping at this time. I will reassess vitals when he awakes.

## 2023-09-05 NOTE — ED Notes (Signed)
 Area cleaned of trash.

## 2023-09-05 NOTE — BH Assessment (Signed)
 Adult/GERO MH  Referral information for Psychiatric Hospitalization faxed to:   [facility] ([phone])   [facility] [phone]- [phone], [phone])   [facility] [phone] or [phone]),  . [facility] [phone]- [phone]),

## 2023-09-05 NOTE — ED Notes (Signed)
 Pt taking shower. Pt was given hygiene items and the following, 1 clean top, 1 clean bottom, with 1 pair of disposable underwear.  Pt changed out into clean clothing.  Staff disposed of all shower supplies.

## 2023-09-05 NOTE — ED Notes (Signed)
 Pt given pm snack and beverage.

## 2023-09-05 NOTE — ED Notes (Signed)
 Pt provided with lunch tray. Pt sitting up eating.

## 2023-09-05 NOTE — ED Notes (Signed)
 Breakfast tray provided.

## 2023-09-05 NOTE — ED Notes (Signed)
VOL  PENDING  PLACEMENT 

## 2023-09-05 NOTE — ED Notes (Signed)
 Pt bed soiled. This NT changed bedding and also provided/helped pt change into a clean brief, fresh socks, and a clean gown. Pt is now back in bed with warm blankets.

## 2023-09-05 NOTE — Consult Note (Signed)
 Patient previously recommended for inpatient psychiatry. He continues to await bed placement at this time.

## 2023-09-05 NOTE — ED Provider Notes (Signed)
 Emergency Medicine Observation Re-evaluation Note  Hunter [name] is a 89 y.o. male, seen on rounds today.  Pt initially presented to the ED for complaints of Psychiatric Evaluation Currently, the patient is resting, voices no medical complaints.  Physical Exam  BP (!) 147/100 (BP Location: Left Arm)   Pulse 69   Temp 98.5 F (36.9 C) (Oral)   Resp 17   Ht 6' (1.829 m)   Wt 97.5 kg   SpO2 98%   BMI 29.16 kg/m  Physical Exam General: Resting in no acute distress Cardiac: No cyanosis Lungs: Equal rise and fall Psych: Not agitated  ED Course / MDM  EKG:EKG Interpretation Date/Time:  Tuesday August 24 2023 00:56:29 EST Ventricular Rate:  68 PR Interval:  202 QRS Duration:  200 QT Interval:  484 QTC Calculation: 514 R Axis:   -69  Text Interpretation: Atrial-sensed ventricular-paced rhythm Abnormal ECG When compared with ECG of 23-Jan-2023 17:47, PREVIOUS ECG IS PRESENT Confirmed by UNCONFIRMED, DOCTOR ([id number]), editor Lonell Face ([id number]) on 08/24/2023 7:41:44 AM  I have reviewed the labs performed to date as well as medications administered while in observation.  Recent changes in the last 24 hours include no events overnight.  Plan  Current plan is for psychiatric disposition.    Irean Hong, MD 09/05/23 [phone]

## 2023-09-06 DIAGNOSIS — E876 Hypokalemia: Secondary | ICD-10-CM | POA: Diagnosis not present

## 2023-09-06 DIAGNOSIS — F03918 Unspecified dementia, unspecified severity, with other behavioral disturbance: Secondary | ICD-10-CM | POA: Diagnosis not present
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
## 2023-09-06 NOTE — ED Notes (Signed)
 Pt had unwitnessed but audible fall in bathroom. Found pt sitting on floor with back against wall. Large amount of soft BM on toilet and floor. Assisted to standing position by EDT and RN. EDP C. York Cerise notified. No visible injuries at this time. Pt pericare provided. New linens, gown and brief. Ambulated back to stretcher with walker.

## 2023-09-06 NOTE — ED Notes (Signed)
 Hospital meal provided.  100% consumed, pt tolerated w/o complaints.  Waste discarded appropriately.

## 2023-09-06 NOTE — ED Notes (Signed)
 Pt currently sleeping will give snack once awaken

## 2023-09-06 NOTE — ED Notes (Signed)
Vol /pending placement 

## 2023-09-06 NOTE — TOC CM/SW Note (Signed)
 Transition of Care [facility]) - Inpatient Brief Assessment   Patient Details  Name: Hunter [name] MRN: [id number] Date of Birth: 1934-06-29  Transition of Care [facility]) CM/SW Contact:    Margarito Liner, LCSW Phone Number: 09/06/2023, 11:04 AM   Clinical Narrative: Patient admitted from [facility] and is unable to return. CSW left voicemail for patient's legal guardian to see if they are working on alternate facility placement.  Transition of Care Asessment: Insurance and Status: Insurance coverage has been reviewed Patient has primary care physician: Yes Home environment has been reviewed: SNF Prior level of function:: Not documented Prior/Current Home Services: No current home services Social Drivers of Health Review: SDOH reviewed no interventions necessary Readmission risk has been reviewed: Yes Transition of care needs: transition of care needs identified, TOC will continue to follow

## 2023-09-06 NOTE — BH Assessment (Signed)
 Referral information for Psychiatric Hospitalization RE-faxed to:    [facility] ([phone])    [facility] [phone] -or- [phone], [phone])    [facility] [phone] or [phone]),   . [facility] [phone] -or- [phone]),

## 2023-09-06 NOTE — ED Notes (Signed)
VOL  PENDING  PLACEMENT 

## 2023-09-06 NOTE — ED Provider Notes (Signed)
 Emergency Medicine Observation Re-evaluation Note  Hunter [name] is a 89 y.o. male, seen on rounds today.  Pt initially presented to the ED for complaints of Psychiatric Evaluation Currently, the patient is resting.  Physical Exam  BP (!) 149/61 (BP Location: Right Arm)   Pulse 83   Temp 97.6 F (36.4 C) (Oral)   Resp 18   Ht 1.829 m (6')   Wt 97.5 kg   SpO2 97%   BMI 29.16 kg/m  Physical Exam Gen:  No acute distress Resp:  Breathing easily and comfortably, no accessory muscle usage Neuro:  Moving all four extremities, no gross focal neuro deficits Psych:  Resting currently, generally calm when awake  ED Course / MDM   I have reviewed the labs performed to date as well as medications administered while in observation.  Recent changes in the last 24 hours include a fall this morning.  He was in the bathroom and slipped coming off the commode.  He reports that he bumped the back of his head against the wall and landed on his buttocks.  He is still ambulating with his walker and has no visible sign of trauma to the back of his head.  He is not on any anticoagulation and I do not think he needs any imaging at this time.  Plan  Current plan is for placement.    Loleta Rose, MD 09/06/23 [phone]

## 2023-09-07 DIAGNOSIS — F03918 Unspecified dementia, unspecified severity, with other behavioral disturbance: Secondary | ICD-10-CM | POA: Diagnosis not present

## 2023-09-07 DIAGNOSIS — E876 Hypokalemia: Secondary | ICD-10-CM | POA: Diagnosis not present

## 2023-09-07 LAB — GASTROINTESTINAL PANEL BY PCR, STOOL (REPLACES STOOL CULTURE)

## 2023-09-07 NOTE — ED Notes (Signed)
 Pt given milk upon request of pt, pt declined a snack.

## 2023-09-07 NOTE — ED Notes (Signed)
 This tech and RN Dahlia Client changed pt's soiled brief.

## 2023-09-07 NOTE — ED Notes (Signed)
 Pt cleaned up after a bowel movement. New brief and bed pads applied.

## 2023-09-07 NOTE — ED Notes (Signed)
Vol /pending placement 

## 2023-09-07 NOTE — ED Notes (Signed)
 This tech obtained pt's vital signs.

## 2023-09-07 NOTE — TOC Initial Note (Signed)
 Transition of Care [facility]) - Initial/Assessment Note    Patient Details  Name: Hunter [name] MRN: [id number] Date of Birth: [birth year]  Transition of Care [facility]) CM/SW Contact:    Margarito Liner, LCSW Phone Number: 09/07/2023, 12:11 PM  Clinical Narrative: Patient has been psychiatrically cleared. CSW called and spoke to guardian. Patient does not have a payor source for placement. He was paying privately at [facility] but is unable to pay privately any longer. Guardian stated that there is an account that they do not have access to because patient does not have a Guardian of the [place]. Court date is pending but will likely be next month. CSW tried calling [facility] but no answer. Will try again later.                 Expected Discharge Plan:  (TBD) Barriers to Discharge: ED Patient/Family Requesting Placement but Has No Payor   Patient Goals and CMS Choice            Expected Discharge Plan and Services     Post Acute Care Choice:  (TBD) Living arrangements for the past 2 months: Skilled Nursing Facility                                      Prior Living Arrangements/Services Living arrangements for the past 2 months: Skilled Nursing Facility Lives with:: Facility Resident Patient language and need for interpreter reviewed:: Yes        Need for Family Participation in Patient Care: Yes (Comment) Care giver support system in place?: Yes (comment)   Criminal Activity/Legal Involvement Pertinent to Current Situation/Hospitalization: No - Comment as needed  Activities of Daily Living      Permission Sought/Granted Permission sought to share information with : Guardian    Share Information with NAME: Chance Chalmers Guest     Permission granted to share info w Relationship: Legal Guardian  Permission granted to share info w Contact Information: [phone]  Emotional Assessment   Attitude/Demeanor/Rapport: Unable to Assess Affect (typically observed):  Unable to Assess Orientation: : Oriented to Self, Oriented to Place Alcohol / Substance Use: Not Applicable Psych Involvement: Yes (comment)  Admission diagnosis:  IVC Patient Active Problem List   Diagnosis Date Noted   Aggressive behavior due to dementia (HCC) 08/24/2023   Physically aggressive behavior 08/24/2023   Pulmonary nodule 01/24/2023   Hallucinations, unspecified 01/14/2023   Cellulitis of right lower extremity 06/17/2021   Lumbar stenosis with neurogenic claudication 05/24/2020   Dementia (HCC) 06/21/2019   Acute blood loss as cause of postoperative anemia 10/26/2017   Delirium 10/24/2017   Cardiac pacemaker 10/22/2017   Fracture of unspecified part of neck of left femur, initial encounter for closed fracture (HCC) 10/21/2017   Complete heart block (HCC) 02/08/2017   BPH (benign prostatic hyperplasia) 05/10/2015   Skin lesion of left lower extremity 03/26/2014   Stasis eczema 03/26/2014   History of skin cancer 03/26/2014   Heart failure with preserved ejection fraction (HFpEF)  12/06/2013   Syncope 08/14/2013   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 06/15/2012   Thrombocytopenia (HCC) 05/23/2012   Neuropathy (HCC) 11/02/2011   CKD (chronic kidney disease) 05/27/2011   Low back pain 04/08/2011   LBBB (left bundle branch block) 05/12/2010   Abnormality of gait 04/23/2010   Spinal stenosis in cervical region 03/21/2010   Constipation 03/19/2010   Urinary frequency 09/05/2009  Acquired hypothyroidism 08/15/2008   Vitamin D deficiency 08/15/2008   Anemia 08/15/2008   Venous stasis 08/15/2008   Mixed hyperlipidemia 02/02/2007   Major depression in full remission (HCC) 02/02/2007   Essential hypertension 02/02/2007   Allergic rhinitis 02/02/2007   GERD (gastroesophageal reflux disease) 02/02/2007   PCP:  Shelva Majestic, MD Pharmacy:   [facility] - [place], [place] - [address] [place] [address] Phone: [phone] Fax:  [phone]  [facility] - [place], [place] - [address] [place] [address] Phone: [phone] Fax: [phone]     Social Drivers of Health (SDOH) Social History: SDOH Screenings   Food Insecurity: No Food Insecurity (06/13/2023)   [facility] from [facility]  Housing: Low Risk  (06/09/2023)  Transportation Needs: No Transportation Needs (06/13/2023)   Received from [facility]  Utilities: Not At Risk (01/24/2023)  Alcohol Screen: Low Risk  (07/06/2021)  Depression (PHQ2-9): Medium Risk (11/26/2022)  Financial Resource Strain: Low Risk  (06/13/2023)   Received from [facility]  Physical Activity: Insufficiently Active (06/13/2023)   Received from [facility]  Social Connections: Moderately Integrated (06/13/2023)   Received from [facility]  Stress: No Stress Concern Present (06/13/2023)   Received from [facility]  Tobacco Use: Low Risk  (08/23/2023)  Recent Concern: Tobacco Use - Medium Risk (06/14/2023)   Received from [facility] Literacy: Medium Risk (06/13/2023)   Received from [facility]   SDOH Interventions:     Readmission Risk Interventions     No data to display

## 2023-09-07 NOTE — ED Provider Notes (Signed)
 Emergency Medicine Observation Re-evaluation Note  Hunter [name] is a 89 y.o. male, seen on rounds today.  Pt initially presented to the ED for complaints of Psychiatric Evaluation Currently, the patient is resting.  Physical Exam  BP (!) 147/62 (BP Location: Left Arm)   Pulse 71   Temp 98.5 F (36.9 C)   Resp 18   Ht 6' (1.829 m)   Wt 97.5 kg   SpO2 96%   BMI 29.16 kg/m  Physical Exam .Gen:  No acute distress Resp:  Breathing easily and comfortably, no accessory muscle usage Neuro:  Moving all four extremities, no gross focal neuro deficits Psych:  Resting currently, calm when awake Other: Abdomen soft nontender  ED Course / MDM  EKG:EKG Interpretation Date/Time:  Tuesday August 24 2023 00:56:29 EST Ventricular Rate:  68 PR Interval:  202 QRS Duration:  200 QT Interval:  484 QTC Calculation: 514 R Axis:   -69  Text Interpretation: Atrial-sensed ventricular-paced rhythm Abnormal ECG When compared with ECG of 23-Jan-2023 17:47, PREVIOUS ECG IS PRESENT Confirmed by UNCONFIRMED, DOCTOR ([id number]), editor Lonell Face [phone]) on 08/24/2023 7:41:44 AM  I have reviewed the labs performed to date as well as medications administered while in observation.  Recent changes in the last 24 hours include been having diarrhea.  Plan  Current plan is for placement, will also send off GI panel.    Claybon Jabs, MD 09/07/23 [phone]

## 2023-09-07 NOTE — ED Notes (Signed)
Hospital meal provided, pt tolerated w/o complaints.

## 2023-09-07 NOTE — ED Notes (Signed)
 Patient dinner tray disposed of at this time.
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
## 2023-09-07 NOTE — ED Notes (Signed)
 Pt given a cup of water at this time per pt request.

## 2023-09-07 NOTE — ED Notes (Signed)
 Pt incontinent of urine. Pt cleaned with peri wipes, brief and chux changed. Eye mask placed on pt at this time. Pt with no further complaints.

## 2023-09-07 NOTE — ED Notes (Signed)
 Patient brief/linens changed due to BM, Gastric panel sample collected and sent.

## 2023-09-07 NOTE — Progress Notes (Signed)
 10 AM attempted to call [facility] rehabilitation center at [phone] in which nursing supervisor has reported to myself that the patient has not returned back to the facility and should no longer be contacted regarding this patient.  10:15 AM Hunter [name] at  [phone] was contacted who is the guardian, ward of the state, reporting that the patient is unable to return to [facility] in which she stated yes as the facility has already notified her that they will not accept the patient, in which she says she has no recommendations of neck steps and believes that the patient needs to remain at the facility until further higher level of care can be achieved, educated on the treatment received thus far as well as the care plan, reporting that due to treatment adherence, behavior and no neurocognitive disorder, it is recommended for the patient to be psych cleared.  After this information patient states that she does not know where the patient can go and I will recommend to reach out to social work to seek for placement within the hospital.  Hunter [name] was in agreement with this plan and was notified that her Hunter [name] rejected the patient and will not be able to take the back.

## 2023-09-07 NOTE — ED Notes (Signed)
 Patient took 1600 medication without any difficulties. Patient also stated he thought he might be incontinent in brief. This RN checked and brief had no incontinence at this time. Patient laid back down and closing eyes with no other needs at this time.

## 2023-09-07 NOTE — ED Notes (Signed)
 MD Tan notified of loose stools

## 2023-09-07 NOTE — ED Notes (Signed)
 Breakfast provided.

## 2023-09-07 NOTE — BH Assessment (Signed)
 Facilities have been declining patient due to the patient's dementia  Referral information for Psychiatric Hospitalization RE-faxed to:    [facility] ([phone])    [facility] [phone] -or- [phone], [phone])    [facility] [phone] or [phone]),   . [facility] [phone] -or- [phone]),

## 2023-09-07 NOTE — ED Notes (Signed)
 Patient brief/linens changed, loss runny stools noted.

## 2023-09-08 DIAGNOSIS — E876 Hypokalemia: Secondary | ICD-10-CM | POA: Diagnosis not present

## 2023-09-08 NOTE — ED Notes (Signed)
VOL/pending placement 

## 2023-09-08 NOTE — ED Notes (Signed)
 Pericare provided. Brief changed and new linens provided.

## 2023-09-08 NOTE — ED Notes (Signed)
 Dinner tray provided to patient

## 2023-09-08 NOTE — ED Notes (Signed)
 Pt with urinary in continence.  Pt cleaned and changed.

## 2023-09-08 NOTE — ED Notes (Signed)
 Pt did not want snack at this time tech gave water

## 2023-09-08 NOTE — ED Notes (Signed)
 Breakfast provided.

## 2023-09-08 NOTE — TOC Progression Note (Signed)
 Transition of Care [facility]) - Progression Note    Patient Details  Name: Hunter [name] MRN: [id number] Date of Birth: [birth year]  [facility] CM/SW Contact  Margarito Liner, LCSW Phone Number: 09/08/2023, 1:45 PM  Clinical Narrative:  CSW discussed case with [facility] director. He is going to reach out to the guardian.   Expected Discharge Plan:  (TBD) Barriers to Discharge: ED Patient/Family Requesting Placement but Has No Payor  Expected Discharge Plan and Services     Post Acute Care Choice:  (TBD) Living arrangements for the past 2 months: Skilled Nursing Facility                                       Social Determinants of Health (SDOH) Interventions SDOH Screenings   Food Insecurity: No Food Insecurity (06/13/2023)   Received from [facility]  Housing: Low Risk  (06/09/2023)  Transportation Needs: No Transportation Needs (06/13/2023)   Received from [facility]  Utilities: Not At Risk (01/24/2023)  Alcohol Screen: Low Risk  (07/06/2021)  Depression (PHQ2-9): Medium Risk (11/26/2022)  Financial Resource Strain: Low Risk  (06/13/2023)   Received from [facility]  Physical Activity: Insufficiently Active (06/13/2023)   Received from [facility]  Social Connections: Moderately Integrated (06/13/2023)   Received from [facility]  Stress: No Stress Concern Present (06/13/2023)   Received from [facility]  Tobacco Use: Low Risk  (08/23/2023)  Recent Concern: Tobacco Use - Medium Risk (06/14/2023)   Received from [facility] Literacy: Medium Risk (06/13/2023)   Received from [facility]    Readmission Risk Interventions     No data to display

## 2023-09-08 NOTE — ED Provider Notes (Signed)
-----------------------------------------   4:25 AM on 09/08/2023 -----------------------------------------   Blood pressure (!) 105/59, pulse 64, temperature 98.5 F (36.9 C), temperature source Oral, resp. rate 17, height 6' (1.829 m), weight 97.5 kg, SpO2 97%.  The patient is calm and cooperative at this time.  There have been no acute events since the last update.  Awaiting disposition plan from case management/social work.    Lachlyn Vanderstelt, Layla Maw, DO 09/08/23 [phone]

## 2023-09-08 NOTE — ED Notes (Signed)
 This tech and NT Kokie completed full bed change for patient. Pt had runny BM. Pt cleaned with wipes and barrier cream put on testicles and perineum area. Bed sheets, blankets, chux, gown and diaper replaced with new clean items. Pt is resting peacefully in bed at this time, no other needs voiced.

## 2023-09-09 DIAGNOSIS — E876 Hypokalemia: Secondary | ICD-10-CM | POA: Diagnosis not present

## 2023-09-09 NOTE — ED Notes (Signed)
VOL/Pending Placement 

## 2023-09-09 NOTE — ED Notes (Signed)
 Patient having multiple episodes of agitation towards security guards calling them "crooks" and they have huge lawsuits out on him along with other delusional thoughts.

## 2023-09-09 NOTE — ED Notes (Signed)
 This NT assisted nurse Kim with changing patient. Patient was wet. Patient has on a clean brief, a clean chux and a clean fitted sheet. Patient is resting comfortably. Posey alarm is on.

## 2023-09-09 NOTE — TOC Progression Note (Addendum)
 Transition of Care [facility]) - Progression Note    Patient Details  Name: Hunter [name] MRN: [id number] Date of Birth: [birth year]  [facility]) CM/SW Contact  Margarito Liner, LCSW Phone Number: 09/09/2023, 10:18 AM  Clinical Narrative:   [facility] director spoke to legal guardian and their supervisor to discuss payor source for placement. He is awaiting a follow up call.  10:43 am: DSS needs a bed offer to determine cost and payor source. CSW sent out referral.  Expected Discharge Plan:  (TBD) Barriers to Discharge: ED Patient/Family Requesting Placement but Has No Payor  Expected Discharge Plan and Services     Post Acute Care Choice:  (TBD) Living arrangements for the past 2 months: Skilled Nursing Facility                                       Social Determinants of Health (SDOH) Interventions SDOH Screenings   Food Insecurity: No Food Insecurity (06/13/2023)   Received from [facility]  Housing: Low Risk  (06/09/2023)  Transportation Needs: No Transportation Needs (06/13/2023)   Received from [facility]  Utilities: Not At Risk (01/24/2023)  Alcohol Screen: Low Risk  (07/06/2021)  Depression (PHQ2-9): Medium Risk (11/26/2022)  Financial Resource Strain: Low Risk  (06/13/2023)   Received from [facility]  Physical Activity: Insufficiently Active (06/13/2023)   Received from [facility]  Social Connections: Moderately Integrated (06/13/2023)   [facility] from [facility]  Stress: No Stress Concern Present (06/13/2023)   Received from [facility]  Tobacco Use: Low Risk  (08/23/2023)  Recent Concern: Tobacco Use - Medium Risk (06/14/2023)   Received from [facility] Literacy: Medium Risk (06/13/2023)   Received from [facility]    Readmission Risk Interventions     No data to display

## 2023-09-09 NOTE — NC FL2 (Signed)
 [place] MEDICAID FL2 LEVEL OF CARE FORM     IDENTIFICATION  Patient Name: Hunter [name] Birthdate: 11/08/1933 Sex: male Admission Date (Current Location): 08/24/2023  [facility] and [place] Number:  Geophysical data processor and Address:  [facility], [address] [place], [place] [address]      Provider Number: [id number]  Attending Physician Name and Address:  Sharman Cheek, MD  Relative Name and Phone Number:       Current Level of Care: Hospital Recommended Level of Care: Memory Care Prior Approval Number:    Date Approved/Denied:   PASRR Number: [id number]  Discharge Plan: Other (Comment) (Memory Care)    Current Diagnoses: Patient Active Problem List   Diagnosis Date Noted   Aggressive behavior due to dementia (HCC) 08/24/2023   Physically aggressive behavior 08/24/2023   Pulmonary nodule 01/24/2023   Hallucinations, unspecified 01/14/2023   Cellulitis of right lower extremity 06/17/2021   Lumbar stenosis with neurogenic claudication 05/24/2020   Dementia (HCC) 06/21/2019   Acute blood loss as cause of postoperative anemia 10/26/2017   Delirium 10/24/2017   Cardiac pacemaker 10/22/2017   Fracture of unspecified part of neck of left femur, initial encounter for closed fracture (HCC) 10/21/2017   Complete heart block (HCC) 02/08/2017   BPH (benign prostatic hyperplasia) 05/10/2015   Skin lesion of left lower extremity 03/26/2014   Stasis eczema 03/26/2014   History of skin cancer 03/26/2014   Heart failure with preserved ejection fraction (HFpEF)  12/06/2013   Syncope 08/14/2013   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 06/15/2012   Thrombocytopenia (HCC) 05/23/2012   Neuropathy (HCC) 11/02/2011   CKD (chronic kidney disease) 05/27/2011   Low back pain 04/08/2011   LBBB (left bundle branch block) 05/12/2010   Abnormality of gait 04/23/2010   Spinal stenosis in cervical region 03/21/2010   Constipation 03/19/2010    Urinary frequency 09/05/2009   Acquired hypothyroidism 08/15/2008   Vitamin D deficiency 08/15/2008   Anemia 08/15/2008   Venous stasis 08/15/2008   Mixed hyperlipidemia 02/02/2007   Major depression in full remission (HCC) 02/02/2007   Essential hypertension 02/02/2007   Allergic rhinitis 02/02/2007   GERD (gastroesophageal reflux disease) 02/02/2007    Orientation RESPIRATION BLADDER Height & Weight     Self  Normal Incontinent Weight: 215 lb (97.5 kg) Height:  6' (182.9 cm)  BEHAVIORAL SYMPTOMS/MOOD NEUROLOGICAL BOWEL NUTRITION STATUS   (Delusional thoughts this morning (2/27). Mostly calm and cooperative.)  (Dementia) Incontinent Diet (Regular)  AMBULATORY STATUS COMMUNICATION OF NEEDS Skin     Verbally Normal                       Personal Care Assistance Level of Assistance              Functional Limitations Info  Sight, Hearing, Speech Sight Info: Adequate Hearing Info: Adequate Speech Info: Adequate    SPECIAL CARE FACTORS FREQUENCY                       Contractures Contractures Info: Not present    Additional Factors Info  Code Status, Allergies, Isolation Precautions Code Status Info: Full code Allergies Info: NKDA     Isolation Precautions Info: Enteric precautions     Current Medications (09/09/2023):  This is the current hospital active medication list Current Facility-Administered Medications  Medication Dose Route Frequency Provider Last Rate Last Admin   albuterol (PROVENTIL) (2.5 MG/3ML) 0.083% nebulizer solution 2.5  mg  2.5 mg Inhalation Q8H PRN Pilar Jarvis, MD       amLODipine (NORVASC) tablet 2.5 mg  2.5 mg Oral Daily Pilar Jarvis, MD   2.5 mg at 09/08/23 1000   aspirin chewable tablet 81 mg  81 mg Oral Daily Pilar Jarvis, MD   81 mg at 09/08/23 1000   atorvastatin (LIPITOR) tablet 20 mg  20 mg Oral QHS Pilar Jarvis, MD   20 mg at 09/08/23 2120   [START ON 09/22/2023] cyanocobalamin (VITAMIN B12) injection 1,000 mcg  1,000 mcg  Intramuscular Q30 days Pilar Jarvis, MD       divalproex (DEPAKOTE) DR tablet 125 mg  125 mg Oral TID Pilar Jarvis, MD   125 mg at 09/08/23 2121   ferrous sulfate tablet 325 mg  325 mg Oral Q breakfast Pilar Jarvis, MD   325 mg at 09/08/23 1000   furosemide (LASIX) tablet 80 mg  80 mg Oral Daily Pilar Jarvis, MD   80 mg at 09/08/23 1000   levothyroxine (SYNTHROID) tablet 75 mcg  75 mcg Oral q morning Pilar Jarvis, MD   75 mcg at 09/08/23 1000   LORazepam (ATIVAN) tablet 2 mg  2 mg Oral Q6H PRN Minna Antis, MD   2 mg at 09/09/23 0302   losartan (COZAAR) tablet 50 mg  50 mg Oral Daily Pilar Jarvis, MD   50 mg at 09/08/23 1000   OLANZapine (ZYPREXA) injection 5 mg  5 mg Intramuscular TID PRN Saucier, Jerlyn Ly, NP   5 mg at 09/03/23 1728   OLANZapine (ZYPREXA) tablet 2.5 mg  2.5 mg Oral QHS Saucier, Jerlyn Ly, NP   2.5 mg at 09/08/23 2120   pantoprazole (PROTONIX) EC tablet 40 mg  40 mg Oral Daily Pilar Jarvis, MD   40 mg at 09/08/23 1000   sertraline (ZOLOFT) tablet 100 mg  100 mg Oral BID Juliann Pares, NP   100 mg at 09/08/23 2120   Current Outpatient Medications  Medication Sig Dispense Refill   albuterol (VENTOLIN HFA) 108 (90 Base) MCG/ACT inhaler Inhale 2 puffs into the lungs every 8 (eight) hours as needed for wheezing or shortness of breath.     amLODipine (NORVASC) 2.5 MG tablet TAKE 1 TABLET BY MOUTH DAILY 30 tablet 3   aspirin 81 MG chewable tablet Chew 81 mg by mouth daily.      atorvastatin (LIPITOR) 20 MG tablet TAKE 1 TABLET BY MOUTH DAILY (Patient taking differently: Take 20 mg by mouth at bedtime.) 90 tablet 3   cyanocobalamin (VITAMIN B12) 1000 MCG/ML injection Inject 1,000 mcg into the muscle every 30 (thirty) days.     divalproex (DEPAKOTE) 125 MG DR tablet Take 125 mg by mouth 3 (three) times daily.     ferrous sulfate 325 (65 FE) MG tablet Take 325 mg by mouth daily with breakfast.     furosemide (LASIX) 80 MG tablet Take 1 tablet (80 mg total) by mouth daily.  30 tablet 6   levothyroxine (SYNTHROID) 75 MCG tablet TAKE 1 TABLET BY MOUTH EVERY MORNING 90 tablet 1   losartan (COZAAR) 50 MG tablet TAKE 1 TABLET BY MOUTH DAILY 90 tablet 1   OLANZapine (ZYPREXA) 2.5 MG tablet Take 2.5 mg by mouth in the morning and at bedtime.     omeprazole (PRILOSEC) 20 MG capsule Take 20 mg by mouth daily.     sennosides-docusate sodium (SENOKOT-S) 8.6-50 MG tablet Take 2 tablets by mouth daily.     sertraline (ZOLOFT) 100 MG  tablet TAKE 2 TABLETS BY MOUTH DAILY 180 tablet 3   ciprofloxacin (CILOXAN) 0.3 % ophthalmic solution Place 2 drops into both eyes every 4 (four) hours while awake. (Patient not taking: Reported on 08/24/2023)     Cyanocobalamin (B-12 PO) Take 1 tablet by mouth daily. (Patient not taking: Reported on 05/31/2023)     Vitamin D, Ergocalciferol, (DRISDOL) 1.25 MG (50000 UNIT) CAPS capsule TAKE 1 CAPSULE BY MOUTH EVERY 7  DAYS 15 capsule 2     Discharge Medications: Please see discharge summary for a list of discharge medications.  Relevant Imaging Results:  Relevant Lab Results:   Additional Information SS#: [id number]  Margarito Liner, LCSW

## 2023-09-09 NOTE — ED Provider Notes (Signed)
 Emergency Medicine Observation Re-evaluation Note  Physical Exam   BP (!) 105/49   Pulse 69   Temp 97.6 F (36.4 C) (Oral)   Resp 18   Ht 6' (1.829 m)   Wt 97.5 kg   SpO2 98%   BMI 29.16 kg/m   Patient appears in no acute distress.  ED Course / MDM   No reported events during my shift at the time of this note.   Pt is awaiting dispo from consultants   Pilar Jarvis MD    Pilar Jarvis, MD 09/09/23 [phone]

## 2023-09-09 NOTE — ED Notes (Signed)
 Alarm notified this nurse of pt attempting to get out of bed. Pt had removed depends and urine was noted to linens. This nurse and ED tech assisted pt with cleaning himself and bed linens changed. Provided for comfort and ensured alarm was under pt.

## 2023-09-09 NOTE — ED Notes (Signed)
VOL/pending placement 

## 2023-09-10 DIAGNOSIS — E876 Hypokalemia: Secondary | ICD-10-CM | POA: Diagnosis not present

## 2023-09-10 NOTE — ED Provider Notes (Signed)
-----------------------------------------   6:53 AM on 09/10/2023 -----------------------------------------   Blood pressure (!) 110/49, pulse 77, temperature 97.9 F (36.6 C), temperature source Oral, resp. rate 18, height 6' (1.829 m), weight 97.5 kg, SpO2 98%.  The patient is calm and cooperative at this time.  There have been no acute events since the last update.  Awaiting disposition plan from case management/social work.    Israa Caban, Layla Maw, DO 09/10/23 [phone]

## 2023-09-10 NOTE — ED Notes (Signed)
 Pt's brief was wet and pt was changed by this RN and NT, [employer]

## 2023-09-10 NOTE — ED Notes (Signed)
 Pt was seen pulling off brief, this tech went into the room and asked pt if he needed to be changed and pt stated yes. This tech changed pt's soiled brief and then provided pt with ice water.There are no other needs at the moment.

## 2023-09-10 NOTE — ED Notes (Signed)
 Pt became very irritated, verbally threatening staff and attempting to force his way down the hallway despite much verbal redirection. Pt was guided to bathroom where he calmed down and RN assisted to give bath with bath wipes, new linens and new clothing. Pt now calm and cooperative, back in his room eating breakfast.

## 2023-09-10 NOTE — TOC Progression Note (Signed)
 Transition of Care [facility]) - Progression Note    Patient Details  Name: Hunter [name] MRN: [id number] Date of Birth: [birth year]  Transition of Care [facility]) CM/SW Contact  Margarito Liner, LCSW Phone Number: 09/10/2023, 11:58 AM  Clinical Narrative:   [facility] is considering patient but does not have a memory care bed at this time. CSW asked the admissions coordinator to notify if a bed does become available.  Expected Discharge Plan:  (TBD) Barriers to Discharge: ED Patient/Family Requesting Placement but Has No Payor  Expected Discharge Plan and Services     Post Acute Care Choice:  (TBD) Living arrangements for the past 2 months: Skilled Nursing Facility                                       Social Determinants of Health (SDOH) Interventions SDOH Screenings   Food Insecurity: No Food Insecurity (06/13/2023)   Received from [facility]  Housing: Low Risk  (06/09/2023)  Transportation Needs: No Transportation Needs (06/13/2023)   Received from [facility]  Utilities: Not At Risk (01/24/2023)  Alcohol Screen: Low Risk  (07/06/2021)  Depression (PHQ2-9): Medium Risk (11/26/2022)  Financial Resource Strain: Low Risk  (06/13/2023)   Received from [facility]  Physical Activity: Insufficiently Active (06/13/2023)   Received from [facility]  Social Connections: Moderately Integrated (06/13/2023)   Received from [facility]  Stress: No Stress Concern Present (06/13/2023)   Received from [facility]  Tobacco Use: Low Risk  (08/23/2023)  Recent Concern: Tobacco Use - Medium Risk (06/14/2023)   Received from [facility] Literacy: Medium Risk (06/13/2023)   Received from [facility]    Readmission Risk Interventions     No data to display

## 2023-09-10 NOTE — ED Notes (Signed)
 Pt fall alarm going off, RN and EDT to enter room very quickly, witnessed fall by EDT on floor, hitting left elbow. No head trauma. PT assisted back to bed by EDT and RN. EDP M. Funke notified. Consulting civil engineer notified.

## 2023-09-10 NOTE — ED Notes (Signed)
 This tech and Teacher, early years/pre cleaned pt up after urinary incontinence. New chux pad and brief were applied. New blankets were placed on pt. Pt is now resting comfortably in bed.

## 2023-09-10 NOTE — ED Notes (Signed)
 Meal provided

## 2023-09-10 NOTE — ED Notes (Signed)
 Vol/toc.Marland Kitchen

## 2023-09-10 NOTE — ED Notes (Signed)
 Pt irritated, anxious and expressing wishes to leave right now. Disoriented to time, situation and place. Pt requesting his medications now.

## 2023-09-11 DIAGNOSIS — E876 Hypokalemia: Secondary | ICD-10-CM | POA: Diagnosis not present

## 2023-09-11 NOTE — ED Notes (Signed)
 Lunch tray given to pt.

## 2023-09-11 NOTE — ED Notes (Signed)
Patient is vol pending placement 

## 2023-09-11 NOTE — ED Notes (Signed)
 Pt had soiled the bed. This tech and Feliz Beam, Charity fundraiser, assisted the patient by providing pericare with bath wipes, and changed the bottom sheet. A new gown, bed pads, and brief were provided for the pt as well.

## 2023-09-11 NOTE — ED Notes (Signed)
 This tech was in pt room changing pt bed linens. Pt ambulated with walker with steady gait to have a seat on his bedside commode while this tech changed the sheets. This tech was then walking around bed to assist pt back onto bed to prepare to place a brief on him. pt was using walker to walk back to his bed when the walker got caught on the edge of a fall mat that was placed beside bed prior to this techs shift. Pt began to fall, and this tech could not get to pt quick enough to be able to hold pt up to keep him from falling. Pt fell on his buttocks, did not hit his head. No LOC, pt speaking throughout incident. Pt wearing fall bracelet, and non slip socks. Security and [place], RN into room to help pt up.

## 2023-09-11 NOTE — ED Notes (Signed)
Vol /pending placement 

## 2023-09-11 NOTE — ED Notes (Signed)
 This tech and Tax inspector assisted pt to bedside commode. While on bedside commode this tech and Brunetta Genera gave pt a bath with bath wipes. Pt was provided with new hospital gown and brief. Bed linens and chux pad were changed. Pt is back in bed resting comfortably with bed alarm on. No further needs at this time.

## 2023-09-11 NOTE — ED Notes (Signed)
 Pt provided with breakfast tray.

## 2023-09-11 NOTE — TOC Progression Note (Signed)
 Transition of Care [facility]) - Progression Note    Patient Details  Name: Hunter [name] MRN: [id number] Date of Birth: 1933-09-11  [facility]) CM/SW Contact  Liliana Cline, LCSW Phone Number: 09/11/2023, 1:52 PM  Clinical Narrative:    Awaiting bed offers, none yet.   Expected Discharge Plan:  (TBD) Barriers to Discharge: ED Patient/Family Requesting Placement but Has No Payor  Expected Discharge Plan and Services     Post Acute Care Choice:  (TBD) Living arrangements for the past 2 months: Skilled Nursing Facility                                       Social Determinants of Health (SDOH) Interventions SDOH Screenings   Food Insecurity: No Food Insecurity (06/13/2023)   Received from [facility]  Housing: Low Risk  (06/09/2023)  Transportation Needs: No Transportation Needs (06/13/2023)   Received from [facility]  Utilities: Not At Risk (01/24/2023)  Alcohol Screen: Low Risk  (07/06/2021)  Depression (PHQ2-9): Medium Risk (11/26/2022)  Financial Resource Strain: Low Risk  (06/13/2023)   Received from [facility]  Physical Activity: Insufficiently Active (06/13/2023)   Received from [facility]  Social Connections: Moderately Integrated (06/13/2023)   Received from [facility]  Stress: No Stress Concern Present (06/13/2023)   Received from [facility]  Tobacco Use: Low Risk  (08/23/2023)  Recent Concern: Tobacco Use - Medium Risk (06/14/2023)   Received from [facility] Literacy: Medium Risk (06/13/2023)   Received from [facility]    Readmission Risk Interventions     No data to display

## 2023-09-11 NOTE — ED Provider Notes (Signed)
 Emergency Medicine Observation Re-evaluation Note  Physical Exam   BP (!) 97/51   Pulse 77   Temp (!) 97.4 F (36.3 C) (Oral)   Resp 19   Ht 6' (1.829 m)   Wt 97.5 kg   SpO2 95%   BMI 29.16 kg/m   Patient appears in no acute distress.  ED Course / MDM   No reported events during my shift at the time of this note.   Pt is awaiting dispo from consultants   Pilar Jarvis MD    Pilar Jarvis, MD 09/11/23 [phone]

## 2023-09-11 NOTE — ED Notes (Signed)
 This tech and Pattricia Boss, Charity fundraiser, assisted the pt to the bedside commode. Pt was cleaned after bowel movement and assisted back to the bed. New bed pad and brief provided. Pt has no concerns or requests at this time.

## 2023-09-11 NOTE — ED Notes (Signed)
 Patient had medium loose bowel movement in bedside commode. Full bath given while patient on commode. New gown, brief and bed sheets applied. Patient walks back to bed with 2+ assistance at this time.

## 2023-09-11 NOTE — ED Notes (Signed)
 RN called to room due to pt having a fall while trying to return to the bed. Pt had a witnessed fall by staff. Pt was using his walker and had slip proof socks on. Per staff member, pt landed on his buttocks. Pt did not his his head. Pt denies pain at this time. Dr. Lenard Lance notified. Pt noted to have a small skin tear on his lower right arm.

## 2023-09-11 DEATH — deceased

## 2023-09-12 DIAGNOSIS — E876 Hypokalemia: Secondary | ICD-10-CM | POA: Diagnosis not present

## 2023-09-12 NOTE — ED Notes (Addendum)
 Pt dinner tray left at bedside. Pt asleep at this moment.

## 2023-09-12 NOTE — ED Notes (Signed)
 TOC, placement pending

## 2023-09-12 NOTE — ED Notes (Signed)
 Just changed patient brief and Corky Crafts he refused to get washed up with water

## 2023-09-12 NOTE — ED Notes (Signed)
 Patient eating lunch tray at this time. Patient given ginger ale

## 2023-09-12 NOTE — ED Provider Notes (Signed)
 Emergency Medicine Observation Re-evaluation Note  Hunter [name] is a 89 y.o. male, seen on rounds today.  Pt initially presented to the ED for complaints of Psychiatric Evaluation  Currently, the patient is resting comfortably.  Physical Exam  BP (!) 136/57 (BP Location: Left Arm)   Pulse 62   Temp 97.6 F (36.4 C) (Oral)   Resp 16   Ht 6' (1.829 m)   Wt 97.5 kg   SpO2 95%   BMI 29.16 kg/m  General: No acute distress Cardiac: Well-perfused extremities Lungs: No respiratory distress Psych: Appropriate mood and affect  ED Course / MDM  EKG:EKG Interpretation Date/Time:  Tuesday August 24 2023 00:56:29 EST Ventricular Rate:  68 PR Interval:  202 QRS Duration:  200 QT Interval:  484 QTC Calculation: 514 R Axis:   -69  Text Interpretation: Atrial-sensed ventricular-paced rhythm Abnormal ECG When compared with ECG of 23-Jan-2023 17:47, PREVIOUS ECG IS PRESENT Confirmed by UNCONFIRMED, DOCTOR ([id number]), editor Lonell Face ([id number]) on 08/24/2023 7:41:44 AM  I have reviewed the labs performed to date as well as medications administered while in observation.  Recent changes in the last 24 hours include none.  Plan  Current plan is for placement.   Merwyn Katos, MD 09/12/23 [phone]

## 2023-09-12 NOTE — ED Notes (Signed)
 Pt asleep at this time. Lunch at [employer].

## 2023-09-12 NOTE — ED Notes (Signed)
Pt asleep. Snack left at bedside.
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
## 2023-09-13 DIAGNOSIS — E876 Hypokalemia: Secondary | ICD-10-CM | POA: Diagnosis not present

## 2023-09-13 NOTE — ED Provider Notes (Signed)
 Emergency Medicine Observation Re-evaluation Note  Hunter [name] is a 89 y.o. male, seen on rounds today.  Pt initially presented to the ED for complaints of Psychiatric Evaluation  Currently, the patient is no acute distress. Resting   Physical Exam  Blood pressure (!) 108/43, pulse 64, temperature 98.6 F (37 C), resp. rate 16, height 6' (1.829 m), weight 97.5 kg, SpO2 94%.  Physical Exam General: No apparent distress Pulm: Normal WOB Psych: resting     ED Course / MDM    I have reviewed the labs performed to date as well as medications administered while in observation.  Recent changes in the last 24 hours include none  Plan   Current plan is to continue to wait for sw  Patient is not under full IVC at this time.   Concha Se, MD 09/13/23 [phone]

## 2023-09-13 NOTE — ED Notes (Signed)
 Patient brief changed by this RN. Patient able to roll from side to side. This RN performed peri-care. New brief applied.

## 2023-09-13 NOTE — TOC Progression Note (Signed)
 Transition of Care [facility]) - Progression Note    Patient Details  Name: Hunter [name] MRN: [id number] Date of Birth: January 17, 1934  Transition [facility]) CM/SW Contact  Margarito Liner, LCSW Phone Number: 09/13/2023, 2:17 PM  Clinical Narrative:   [name] has memory care beds and will reach out to guardian for consent to assess him. CSW sent referral to [facility] in a secure email. Per notes, patient is requiring maximum assistance. MD will consult PT and OT.  Expected Discharge Plan:  (TBD) Barriers to Discharge: ED Patient/Family Requesting Placement but Has No Payor  Expected Discharge Plan and Services     Post Acute Care Choice:  (TBD) Living arrangements for the past 2 months: Skilled Nursing Facility                                       Social Determinants of Health (SDOH) Interventions SDOH Screenings   Food Insecurity: No Food Insecurity (06/13/2023)   Received from [facility]  Housing: Low Risk  (06/09/2023)  Transportation Needs: No Transportation Needs (06/13/2023)   Received from [facility]  Utilities: Not At Risk (01/24/2023)  Alcohol Screen: Low Risk  (07/06/2021)  Depression (PHQ2-9): Medium Risk (11/26/2022)  Financial Resource Strain: Low Risk  (06/13/2023)   Received from [facility]  Physical Activity: Insufficiently Active (06/13/2023)   Received from [facility]  Social Connections: Moderately Integrated (06/13/2023)   Received from [facility]  Stress: No Stress Concern Present (06/13/2023)   Received from [facility]  Tobacco Use: Low Risk  (08/23/2023)  Recent Concern: Tobacco Use - Medium Risk (06/14/2023)   Received from [facility] Literacy: Medium Risk (06/13/2023)   Received from [facility]    Readmission Risk Interventions     No data to display

## 2023-09-13 NOTE — ED Notes (Signed)
Pt given PM snack at this time.  

## 2023-09-13 NOTE — ED Notes (Signed)
 Pt stating he needed to pee, pt sat up with maximum assistance. Pt had brief on and had a strong smell of urine. Brief was changed and peri care done.

## 2023-09-13 NOTE — ED Notes (Signed)
vol/toc placement.. 

## 2023-09-13 NOTE — ED Notes (Signed)
 Pt stated that his brief was wet and needed to use the restroom, this tech proceeded to change pt's brief. There are no other needs of the pt.

## 2023-09-14 NOTE — ED Notes (Signed)
 This tech obtained vital signs on pt.
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
## 2023-09-14 NOTE — ED Notes (Signed)
 Pt given snack and beverage.

## 2023-09-14 NOTE — ED Notes (Signed)
 Patient changed by this Clinical research associate and ED tech Raymond before relocating patient to room 26.  Patient has red area on coxal area. Mepilex bandage placed to prevent further redness.

## 2023-09-14 NOTE — ED Notes (Signed)
 Patient's brief changed and patient repositioned in bed and given his dinner tray.

## 2023-09-14 NOTE — ED Notes (Signed)
 Patient given a breakfast tray and repositioned in bed to eat.

## 2023-09-14 NOTE — TOC Progression Note (Signed)
 Transition of Care [facility]) - Progression Note    Patient Details  Name: Hunter [name] MRN: [id number] Date of Birth: Nov 18, 1933  Transition of Care [facility]) CM/SW Contact  Margarito Liner, LCSW Phone Number: 09/14/2023, 11:03 AM  Clinical Narrative:  [address] [facility] is unable to offer a bed.   Expected Discharge Plan:  (TBD) Barriers to Discharge: ED Patient/Family Requesting Placement but Has No Payor  Expected Discharge Plan and Services     Post Acute Care Choice:  (TBD) Living arrangements for the past 2 months: Skilled Nursing Facility                                       Social Determinants of Health (SDOH) Interventions SDOH Screenings   Food Insecurity: No Food Insecurity (06/13/2023)   Received from [facility]  Housing: Low Risk  (06/09/2023)  Transportation Needs: No Transportation Needs (06/13/2023)   Received from [facility]  Utilities: Not At Risk (01/24/2023)  Alcohol Screen: Low Risk  (07/06/2021)  Depression (PHQ2-9): Medium Risk (11/26/2022)  Financial Resource Strain: Low Risk  (06/13/2023)   Received from [facility]  Physical Activity: Insufficiently Active (06/13/2023)   Received from [facility]  Social Connections: Moderately Integrated (06/13/2023)   Received from [facility]  Stress: No Stress Concern Present (06/13/2023)   Received from [facility]  Tobacco Use: Low Risk  (08/23/2023)  Recent Concern: Tobacco Use - Medium Risk (06/14/2023)   Received from [facility] Literacy: Medium Risk (06/13/2023)   Received from [facility]    Readmission Risk Interventions     No data to display
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
## 2023-09-14 NOTE — ED Notes (Signed)
 Pt pulled up in bed, brief changed

## 2023-09-15 DIAGNOSIS — E876 Hypokalemia: Secondary | ICD-10-CM | POA: Diagnosis not present

## 2023-09-15 NOTE — ED Notes (Signed)
 Patient requesting to use the toilet at this time. Patient assisted by this RN and [place], EDT to use the toilet. Patient had a bowel movement and was cleaned up. A complete linen change was completed. Patient was returned to bed and placed in a new brief. A new sacral foam was placed on the patient. The patient's bed was returned to lowest position with call light in reach. Bed alarm activated. Patient provided 2 ice waters. Patient denies other needs at this time.

## 2023-09-15 NOTE — ED Notes (Signed)
Brief changed, peri care done.

## 2023-09-15 NOTE — ED Notes (Signed)
 Fall risk interventions applied.

## 2023-09-15 NOTE — ED Notes (Signed)
 Patient found to have had an episode of urinary incontinence. Patient cleaned up and soiled brief was disposed of. Patient's paper pads and brief was changed. Patient was placed in a clean gown and given a warm blanket.

## 2023-09-15 NOTE — ED Notes (Signed)
Lunch @ bedside. 

## 2023-09-15 NOTE — ED Notes (Signed)
 Pt provided with dinner tray.

## 2023-09-15 NOTE — ED Notes (Signed)
 Bed bath given, gown changed, Pericare applied, oral care provided, and sheets changed. New sacral foam dressing applied.

## 2023-09-15 NOTE — ED Notes (Signed)
Breakfast @ bedside. 

## 2023-09-15 NOTE — [place] FL2 (Signed)
 [place] MEDICAID FL2 LEVEL OF CARE FORM     IDENTIFICATION  Patient Name: Hunter [name] Birthdate: 09-08-1933 Sex: male Admission Date (Current Location): 08/24/2023  [place] and [place] Number:  Geophysical data processor and Address:  [facility], [address], [place], [place] [address]      Provider Number: [id number]  Attending Physician Name and Address:  Artis Delay, MD  Relative Name and Phone Number:       Current Level of Care: Hospital Recommended Level of Care: Skilled Nursing Facility Prior Approval Number:    Date Approved/Denied:   PASRR Number: [id number]  Discharge Plan: SNF    Current Diagnoses: Patient Active Problem List   Diagnosis Date Noted   Aggressive behavior due to dementia (HCC) 08/24/2023   Physically aggressive behavior 08/24/2023   Pulmonary nodule 01/24/2023   Hallucinations, unspecified 01/14/2023   Cellulitis of right lower extremity 06/17/2021   Lumbar stenosis with neurogenic claudication 05/24/2020   Dementia (HCC) 06/21/2019   Acute blood loss as cause of postoperative anemia 10/26/2017   Delirium 10/24/2017   Cardiac pacemaker 10/22/2017   Fracture of unspecified part of neck of left femur, initial encounter for closed fracture (HCC) 10/21/2017   Complete heart block (HCC) 02/08/2017   BPH (benign prostatic hyperplasia) 05/10/2015   Skin lesion of left lower extremity 03/26/2014   Stasis eczema 03/26/2014   History of skin cancer 03/26/2014   Heart failure with preserved ejection fraction (HFpEF)  12/06/2013   Syncope 08/14/2013   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 06/15/2012   Thrombocytopenia (HCC) 05/23/2012   Neuropathy (HCC) 11/02/2011   CKD (chronic kidney disease) 05/27/2011   Low back pain 04/08/2011   LBBB (left bundle branch block) 05/12/2010   Abnormality of gait 04/23/2010   Spinal stenosis in cervical region 03/21/2010   Constipation 03/19/2010   Urinary frequency  09/05/2009   Acquired hypothyroidism 08/15/2008   Vitamin D deficiency 08/15/2008   Anemia 08/15/2008   Venous stasis 08/15/2008   Mixed hyperlipidemia 02/02/2007   Major depression in full remission (HCC) 02/02/2007   Essential hypertension 02/02/2007   Allergic rhinitis 02/02/2007   GERD (gastroesophageal reflux disease) 02/02/2007    Orientation RESPIRATION BLADDER Height & Weight     Self  Normal Incontinent Weight: 215 lb (97.5 kg) Height:  6' (182.9 cm)  BEHAVIORAL SYMPTOMS/MOOD NEUROLOGICAL BOWEL NUTRITION STATUS   (Delusional thoughts this morning (2/27). Mostly calm and cooperative.)  (Dementia) Incontinent Diet (Regular)  AMBULATORY STATUS COMMUNICATION OF NEEDS Skin   Limited Assist Verbally Normal                       Personal Care Assistance Level of Assistance  Bathing, Feeding, Dressing Bathing Assistance: Maximum assistance Feeding assistance: Limited assistance Dressing Assistance: Maximum assistance     Functional Limitations Info  Sight, Hearing, Speech Sight Info: Adequate Hearing Info: Adequate Speech Info: Adequate    SPECIAL CARE FACTORS FREQUENCY                       Contractures Contractures Info: Not present    Additional Factors Info  Code Status, Allergies, Isolation Precautions Code Status Info: Full code Allergies Info: NKDA     Isolation Precautions Info: Enteric precautions     Current Medications (09/15/2023):  This is the current hospital active medication list Current Facility-Administered Medications  Medication Dose Route Frequency Provider Last Rate Last Admin   albuterol (PROVENTIL) (2.5  MG/3ML) 0.083% nebulizer solution 2.5 mg  2.5 mg Inhalation Q8H PRN Pilar Jarvis, MD       amLODipine (NORVASC) tablet 2.5 mg  2.5 mg Oral Daily Pilar Jarvis, MD   2.5 mg at 09/14/23 1026   aspirin chewable tablet 81 mg  81 mg Oral Daily Pilar Jarvis, MD   81 mg at 09/14/23 1026   atorvastatin (LIPITOR) tablet 20 mg  20 mg Oral  Driscilla Grammes, MD   20 mg at 09/14/23 2120   [START ON 09/22/2023] cyanocobalamin (VITAMIN B12) injection 1,000 mcg  1,000 mcg Intramuscular Q30 days Pilar Jarvis, MD       divalproex (DEPAKOTE) DR tablet 125 mg  125 mg Oral TID Pilar Jarvis, MD   125 mg at 09/14/23 2121   ferrous sulfate tablet 325 mg  325 mg Oral Q breakfast Pilar Jarvis, MD   325 mg at 09/14/23 0846   furosemide (LASIX) tablet 80 mg  80 mg Oral Daily Pilar Jarvis, MD   80 mg at 09/14/23 1026   levothyroxine (SYNTHROID) tablet 75 mcg  75 mcg Oral q morning Pilar Jarvis, MD   75 mcg at 09/14/23 1026   LORazepam (ATIVAN) tablet 2 mg  2 mg Oral Q6H PRN Minna Antis, MD   2 mg at 09/13/23 2122   losartan (COZAAR) tablet 50 mg  50 mg Oral Daily Pilar Jarvis, MD   50 mg at 09/14/23 1027   OLANZapine (ZYPREXA) injection 5 mg  5 mg Intramuscular TID PRN Juliann Pares, NP   5 mg at 09/14/23 0250   OLANZapine (ZYPREXA) tablet 2.5 mg  2.5 mg Oral QHS Saucier, Jerlyn Ly, NP   2.5 mg at 09/14/23 2121   pantoprazole (PROTONIX) EC tablet 40 mg  40 mg Oral Daily Pilar Jarvis, MD   40 mg at 09/14/23 1027   sertraline (ZOLOFT) tablet 100 mg  100 mg Oral BID Juliann Pares, NP   100 mg at 09/14/23 2120   Current Outpatient Medications  Medication Sig Dispense Refill   albuterol (VENTOLIN HFA) 108 (90 Base) MCG/ACT inhaler Inhale 2 puffs into the lungs every 8 (eight) hours as needed for wheezing or shortness of breath.     amLODipine (NORVASC) 2.5 MG tablet TAKE 1 TABLET BY MOUTH DAILY 30 tablet 3   aspirin 81 MG chewable tablet Chew 81 mg by mouth daily.      atorvastatin (LIPITOR) 20 MG tablet TAKE 1 TABLET BY MOUTH DAILY (Patient taking differently: Take 20 mg by mouth at bedtime.) 90 tablet 3   cyanocobalamin (VITAMIN B12) 1000 MCG/ML injection Inject 1,000 mcg into the muscle every 30 (thirty) days.     divalproex (DEPAKOTE) 125 MG DR tablet Take 125 mg by mouth 3 (three) times daily.     ferrous sulfate 325 (65 FE) MG  tablet Take 325 mg by mouth daily with breakfast.     furosemide (LASIX) 80 MG tablet Take 1 tablet (80 mg total) by mouth daily. 30 tablet 6   levothyroxine (SYNTHROID) 75 MCG tablet TAKE 1 TABLET BY MOUTH EVERY MORNING 90 tablet 1   losartan (COZAAR) 50 MG tablet TAKE 1 TABLET BY MOUTH DAILY 90 tablet 1   OLANZapine (ZYPREXA) 2.5 MG tablet Take 2.5 mg by mouth in the morning and at bedtime.     omeprazole (PRILOSEC) 20 MG capsule Take 20 mg by mouth daily.     sennosides-docusate sodium (SENOKOT-S) 8.6-50 MG tablet Take 2 tablets by mouth daily.  sertraline (ZOLOFT) 100 MG tablet TAKE 2 TABLETS BY MOUTH DAILY 180 tablet 3   ciprofloxacin (CILOXAN) 0.3 % ophthalmic solution Place 2 drops into both eyes every 4 (four) hours while awake. (Patient not taking: Reported on 08/24/2023)     Cyanocobalamin (B-12 PO) Take 1 tablet by mouth daily. (Patient not taking: Reported on 05/31/2023)     Vitamin D, Ergocalciferol, (DRISDOL) 1.25 MG (50000 UNIT) CAPS capsule TAKE 1 CAPSULE BY MOUTH EVERY 7  DAYS 15 capsule 2     Discharge Medications: Please see discharge summary for a list of discharge medications.  Relevant Imaging Results:  Relevant Lab Results:   Additional Information SS#: [id number]  Margarito Liner, LCSW

## 2023-09-15 NOTE — TOC Progression Note (Addendum)
 Transition of Care [facility]) - Progression Note    Patient Details  Name: Hunter [name] MRN: [id number] Date of Birth: April 21, 1934  [facility] CM/SW Contact  Margarito Liner, LCSW Phone Number: 09/15/2023, 9:12 AM  Clinical Narrative:   PT and OT are recommending rehab. CSW updated SNF referral and sent it back out with comment to note that he needs rehab and then transition to LTC/memory care placement after rehab.  11:44 am: Financial counselor confirmed patient does have Medicaid and added it to the facesheet. CSW sent the SNF referral back out.  Expected Discharge Plan:  (TBD) Barriers to Discharge: ED Patient/Family Requesting Placement but Has No Payor  Expected Discharge Plan and Services     Post Acute Care Choice:  (TBD) Living arrangements for the past 2 months: Skilled Nursing Facility                                       Social Determinants of Health (SDOH) Interventions SDOH Screenings   Food Insecurity: No Food Insecurity (06/13/2023)   Received from [facility]  Housing: Low Risk  (06/09/2023)  Transportation Needs: No Transportation Needs (06/13/2023)   Received from [facility]  Utilities: Not At Risk (01/24/2023)  Alcohol Screen: Low Risk  (07/06/2021)  Depression (PHQ2-9): Medium Risk (11/26/2022)  Financial Resource Strain: Low Risk  (06/13/2023)   Received from [facility]  Physical Activity: Insufficiently Active (06/13/2023)   Received from [facility]  Social Connections: Moderately Integrated (06/13/2023)   [facility] from [facility]  Stress: No Stress Concern Present (06/13/2023)   Received from [facility]  Tobacco Use: Low Risk  (08/23/2023)  Recent Concern: Tobacco Use - Medium Risk (06/14/2023)   Received from [facility] Literacy: Medium Risk (06/13/2023)   Received from [facility]    Readmission Risk Interventions     No data to display

## 2023-09-15 NOTE — ED Provider Notes (Signed)
-----------------------------------------   7:12 AM on 09/15/2023 -----------------------------------------   Blood pressure (!) 121/49, pulse 69, temperature 97.6 F (36.4 C), temperature source Oral, resp. rate 18, height 1.829 m (6'), weight 97.5 kg, SpO2 98%.  The patient is calm and cooperative at this time.  There have been no acute events since the last update.  Awaiting disposition plan from [facility] team.   Loleta Rose, MD 09/15/23 [phone]

## 2023-09-15 NOTE — ED Notes (Signed)
 VOL/ TOC

## 2023-09-16 DIAGNOSIS — E876 Hypokalemia: Secondary | ICD-10-CM | POA: Diagnosis not present

## 2023-09-16 NOTE — TOC Progression Note (Addendum)
 Transition of Care [facility]) - Progression Note    Patient Details  Name: Hunter [name] MRN: [id number] Date of Birth: 1933-08-04  Transition of Care [facility]) CM/SW Contact  Margarito Liner, LCSW Phone Number: 09/16/2023, 10:10 AM  Clinical Narrative:  No bed offers at this time. CSW left guardian a voicemail to see if he gets disability.   2:26 pm: Received call back from legal guardian. Patient does not get disability but does get social security. Guardian has not tried to look for placement because she does not have a list of facilities to send referral to and doesn't have an FL2. CSW sent FL2 to her in a secure email.  2:38 pm: CSW called liaison with [facility] and [facility]. Patient will have to be without prn psychotropic medications for one week before she can look at him.  Expected Discharge Plan:  (TBD) Barriers to Discharge: ED Patient/Family Requesting Placement but Has No Payor  Expected Discharge Plan and Services     Post Acute Care Choice:  (TBD) Living arrangements for the past 2 months: Skilled Nursing Facility                                       Social Determinants of Health (SDOH) Interventions SDOH Screenings   Food Insecurity: No Food Insecurity (06/13/2023)   Received from [facility]  Housing: Low Risk  (06/09/2023)  Transportation Needs: No Transportation Needs (06/13/2023)   Received from [facility]  Utilities: Not At Risk (01/24/2023)  Alcohol Screen: Low Risk  (07/06/2021)  Depression (PHQ2-9): Medium Risk (11/26/2022)  Financial Resource Strain: Low Risk  (06/13/2023)   [facility]  Physical Activity: Insufficiently Active (06/13/2023)   Received from [facility]  Social Connections: Moderately Integrated (06/13/2023)   Received from [facility]  Stress: No Stress Concern Present (06/13/2023)   Received from [facility]  Tobacco Use: Low Risk  (08/23/2023)  Recent Concern: Tobacco Use - Medium Risk  (06/14/2023)   Received from [facility] Literacy: Medium Risk (06/13/2023)   Received from [facility]    Readmission Risk Interventions     No data to display
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
## 2023-09-16 NOTE — ED Provider Notes (Signed)
 Emergency Medicine Observation Re-evaluation Note  Physical Exam   BP (!) 124/43 (BP Location: Left Arm)   Pulse 65   Temp 97.7 F (36.5 C) (Oral)   Resp 20   Ht 6' (1.829 m)   Wt 97.5 kg   SpO2 96%   BMI 29.16 kg/m   Patient appears in no acute distress.  ED Course / MDM   No reported events during my shift at the time of this note.   Pt is awaiting dispo from consultants   Pilar Jarvis MD    Pilar Jarvis, MD 09/16/23 [phone]
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
## 2023-09-16 NOTE — ED Notes (Signed)
 Pts brief changed and given new blankets no complaints at this time no s/s of distress will continue to monitor.

## 2023-09-16 NOTE — ED Notes (Signed)
 Pt awake in bed no s/s of distress no complaints at this time. Will continue to monitor.

## 2023-09-16 NOTE — ED Notes (Signed)
 Pt provided with dinner tray. Pt sitting up eating

## 2023-09-17 ENCOUNTER — Encounter (INDEPENDENT_AMBULATORY_CARE_PROVIDER_SITE_OTHER): Payer: Medicare Other | Admitting: Ophthalmology

## 2023-09-17 DIAGNOSIS — E876 Hypokalemia: Secondary | ICD-10-CM | POA: Diagnosis not present

## 2023-09-17 NOTE — TOC Progression Note (Signed)
 Transition of Care [facility]) - Progression Note    Patient Details  Name: Hunter [name] MRN: [id number] Date of Birth: Nov 21, 1933  Transition of Care [facility]) CM/SW Contact  Margarito Liner, LCSW Phone Number: 09/17/2023, 9:47 AM  Clinical Narrative: Per MAR, patient received prn Ativan last night.    Expected Discharge Plan:  (TBD) Barriers to Discharge: ED Patient/Family Requesting Placement but Has No Payor  Expected Discharge Plan and Services     Post Acute Care Choice:  (TBD) Living arrangements for the past 2 months: Skilled Nursing Facility                                       Social Determinants of Health (SDOH) Interventions SDOH Screenings   Food Insecurity: No Food Insecurity (06/13/2023)   Received from [facility]  Housing: Low Risk  (06/09/2023)  Transportation Needs: No Transportation Needs (06/13/2023)   Received from [facility]  Utilities: Not At Risk (01/24/2023)  Alcohol Screen: Low Risk  (07/06/2021)  Depression (PHQ2-9): Medium Risk (11/26/2022)  Financial Resource Strain: Low Risk  (06/13/2023)   Received from [facility]  Physical Activity: Insufficiently Active (06/13/2023)   Received from [facility]  Social Connections: Moderately Integrated (06/13/2023)   Received from [facility]  Stress: No Stress Concern Present (06/13/2023)   Received from [facility]  Tobacco Use: Low Risk  (08/23/2023)  Recent Concern: Tobacco Use - Medium Risk (06/14/2023)   Received from [facility] Literacy: Medium Risk (06/13/2023)   Received from [facility]    Readmission Risk Interventions     No data to display

## 2023-09-17 NOTE — ED Notes (Signed)
 Pt given meal tray.

## 2023-09-17 NOTE — ED Notes (Signed)
 Pt with soiled brief and linens. Hygiene performed. New brief and linens in place. Pt denies any further needs at this time

## 2023-09-17 NOTE — ED Provider Notes (Signed)
-----------------------------------------   11:36 PM on 09/17/2023 -----------------------------------------   Blood pressure (!) 111/53, pulse 63, temperature 98.1 F (36.7 C), temperature source Axillary, resp. rate 16, height 6' (1.829 m), weight 97.5 kg, SpO2 99%.  The patient is calm and cooperative at this time.  There have been no acute events since the last update.  Awaiting disposition plan from [facility] team.   Janith Lima, MD 09/17/23 [phone]

## 2023-09-17 NOTE — ED Notes (Signed)
Patient is resting comfortably. Visible chest rise and fall.

## 2023-09-17 NOTE — ED Notes (Signed)
vol/toc placement.. 

## 2023-09-17 NOTE — ED Notes (Signed)
 Brief soiled. Pt cleaned. New brief in place

## 2023-09-17 NOTE — ED Notes (Signed)
 Breakfast tray sat at bedside. Pt sleeping at this time

## 2023-09-17 NOTE — ED Provider Notes (Signed)
-----------------------------------------   6:26 AM on 09/17/2023 -----------------------------------------   Blood pressure (!) 110/57, pulse 63, temperature 97.6 F (36.4 C), temperature source Oral, resp. rate 18, height 6' (1.829 m), weight 97.5 kg, SpO2 97%.  The patient is calm and cooperative at this time.  There have been no acute events since the last update.  Awaiting disposition plan from Social Work team.   Irean Hong, MD 09/17/23 [phone]

## 2023-09-18 ENCOUNTER — Emergency Department

## 2023-09-18 DIAGNOSIS — E876 Hypokalemia: Secondary | ICD-10-CM | POA: Diagnosis not present

## 2023-09-18 DIAGNOSIS — M19012 Primary osteoarthritis, left shoulder: Secondary | ICD-10-CM | POA: Diagnosis not present

## 2023-09-18 DIAGNOSIS — R079 Chest pain, unspecified: Secondary | ICD-10-CM | POA: Diagnosis not present

## 2023-09-18 DIAGNOSIS — J9811 Atelectasis: Secondary | ICD-10-CM | POA: Diagnosis not present

## 2023-09-18 DIAGNOSIS — M25512 Pain in left shoulder: Secondary | ICD-10-CM | POA: Diagnosis not present

## 2023-09-18 DIAGNOSIS — Z950 Presence of cardiac pacemaker: Secondary | ICD-10-CM | POA: Diagnosis not present

## 2023-09-18 NOTE — Progress Notes (Signed)
 Mobility Specialist - Progress Note     09/18/23 1200  Mobility  Activity Stood at bedside;Transferred from bed to chair  Level of Assistance +2 (takes two people)  Location manager Ambulated (ft) 3 ft  Range of Motion/Exercises Active  Activity Response Tolerated well  Mobility Referral Yes  Mobility visit 1 Mobility  Mobility Specialist Start Time (ACUTE ONLY) 1136  Mobility Specialist Stop Time (ACUTE ONLY) 1200  Mobility Specialist Time Calculation (min) (ACUTE ONLY) 24 min   Pt resting in bed on RA upon entry. Pt STS x3 +2 assist with RW before turn pivot to recliner in room. Pt left in recliner with needs in reach and chair alarm activated.   Johnathan Hausen Mobility Specialist 09/18/23, 2:06 PM

## 2023-09-18 NOTE — ED Notes (Signed)
Pt's brief and gown changed.

## 2023-09-18 NOTE — ED Notes (Signed)
 Pt found trying to get out of bed. Pt cussing out staff that were trying to help pt back up to a proper position in the bed.

## 2023-09-18 NOTE — ED Notes (Signed)
 Mobility specialist at the bedside to see if pt wants to go back to the stretcher. Pt refuses at this time. Pt remains in recliner with bed alarm in place. Pt currently redirectable at this time.

## 2023-09-18 NOTE — ED Notes (Signed)
 Pt attempting to get out of bed. Ladona Ridgel, RN and I pulled pt up in bed. Pt yelling at staff.

## 2023-09-18 NOTE — ED Notes (Signed)
Lunch tray provided with beverage 

## 2023-09-18 NOTE — ED Provider Notes (Signed)
 Emergency Medicine Observation Re-evaluation Note  Physical Exam   BP (!) 111/53   Pulse 63   Temp 98.1 F (36.7 C) (Axillary)   Resp 16   Ht 6' (1.829 m)   Wt 97.5 kg   SpO2 99%   BMI 29.16 kg/m   Patient appears in no acute distress.  ED Course / MDM   No reported events during my shift at the time of this note.   Pt is awaiting dispo from consultants   Pilar Jarvis MD    Pilar Jarvis, MD 09/18/23 [phone]

## 2023-09-18 NOTE — ED Notes (Signed)
 Pt placed on bedpan so pt could have BM. PT was successful. Peri care was provided. Pt remains in bed with no other needs at this time.

## 2023-09-18 NOTE — ED Notes (Signed)
 Pt currently sleeping will ask if pt wants snack if he wakes up

## 2023-09-18 NOTE — ED Notes (Signed)
 Pt  provided with pericare and dry brief.  Given graham crackers per his request.

## 2023-09-18 NOTE — ED Notes (Signed)
 Pt continuously trying to get out of bed stating the he needs to leave.  Unable to redirect.

## 2023-09-18 NOTE — ED Notes (Signed)
 Mobility specialist at the bedside to assist pt with movement and standing and possible ambulation. Pt moved to recliner. Provided for comfort and safety. Pt had been incontinent of urine; depends changed while standing with assistance from mobility staff.

## 2023-09-18 NOTE — Progress Notes (Signed)
 Mobility Specialist - Progress Note     09/18/23 1526  Mobility  Activity Stood at bedside;Transferred from chair to bed;Dangled on edge of bed  Level of Assistance +2 (takes two people)  Location manager Ambulated (ft) 3 ft  Range of Motion/Exercises Active  Activity Response Tolerated well  Mobility Referral Yes  Mobility visit 1 Mobility  Mobility Specialist Start Time (ACUTE ONLY) 1507  Mobility Specialist Stop Time (ACUTE ONLY) 1526  Mobility Specialist Time Calculation (min) (ACUTE ONLY) 19 min   Pt resting in recliner on RA upon entry. Pt STS +2 assist to RW and turn pivots back to bed. Pt given directional cuing and encouragement throughout session. Pt was unable to fully back up toward bed due to fatigue. Bed pulled to patient. Pt left in bed with needs in reach and bed alarm activated.   Johnathan Hausen Mobility Specialist 09/18/23, 3:29 PM

## 2023-09-18 NOTE — ED Notes (Signed)
 Report received from off going Nurse. Pt A&O to self at baseline. Resting quietly in bed. Rise and fall of chest present. No distress present. Bed low and locked, call light in reach.

## 2023-09-18 NOTE — ED Notes (Signed)
 Pt attempting to crawl out of the end of the bed. Pt pulled back up in bed. Bed alarm reset.

## 2023-09-18 NOTE — Progress Notes (Signed)
 Mobility Specialist - Progress Note     09/18/23 1402  Mobility  Activity Stood at bedside  Level of Assistance Moderate assist, patient does 50-74%  Assistive Device Front wheel walker  Range of Motion/Exercises Active  Activity Response Tolerated well  Mobility Referral Yes  Mobility visit 1 Mobility  Mobility Specialist Start Time (ACUTE ONLY) 1346  Mobility Specialist Stop Time (ACUTE ONLY) 1402  Mobility Specialist Time Calculation (min) (ACUTE ONLY) 16 min   Pt attempting to get out of recliner upon entry on RA. Pt redirected and STS with author briefly before returning to recliner. Pt given seated leg exercises to encourage safe mobility. Pt chair alarm activated and left with needs in reach.   Johnathan Hausen Mobility Specialist 09/18/23, 2:19 PM

## 2023-09-19 DIAGNOSIS — E876 Hypokalemia: Secondary | ICD-10-CM | POA: Diagnosis not present

## 2023-09-19 NOTE — ED Notes (Signed)
 No 0200 rounding charted d/t daylight savings time.

## 2023-09-19 NOTE — ED Provider Notes (Signed)
-----------------------------------------   3:42 PM on 09/19/2023 -----------------------------------------   Blood pressure 117/60, pulse 61, temperature 97.6 F (36.4 C), temperature source Oral, resp. rate 15, height 6' (1.829 m), weight 97.5 kg, SpO2 93%.  The patient is calm and cooperative at this time.  There have been no acute events since the last update.  Awaiting disposition plan from [facility] team.   Janith Lima, MD 09/19/23 [phone]

## 2023-09-19 NOTE — ED Notes (Signed)
 VOL  TOC  PLACEMENT
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
## 2023-09-19 NOTE — ED Notes (Signed)
 Patient repositioned sitting up to eat in bed.

## 2023-09-19 NOTE — ED Notes (Signed)
 Pt provided with peri care, brief change, and repositioning in bed,

## 2023-09-19 NOTE — ED Notes (Addendum)
 Patient washed face and combed hair. Pillow case changed. Patient brushed teeth and rinsed mouth.

## 2023-09-19 NOTE — ED Notes (Signed)
 Pt placed on bedside, pt had BM small amount.  RN cleaned patient, replaced chuck, repositioned pt.

## 2023-09-19 NOTE — ED Provider Notes (Signed)
-----------------------------------------   5:31 AM on 09/19/2023 -----------------------------------------   Blood pressure (!) 110/52, pulse 70, temperature 97.8 F (36.6 C), temperature source Oral, resp. rate 18, height 6' (1.829 m), weight 97.5 kg, SpO2 90%.  The patient is calm and cooperative at this time.  There have been no acute events since the last update.  Awaiting disposition plan from case management/social work.    Braelon Sprung, Layla Maw, DO 09/19/23 [phone]

## 2023-09-20 DIAGNOSIS — E876 Hypokalemia: Secondary | ICD-10-CM | POA: Diagnosis not present
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
## 2023-09-20 NOTE — ED Provider Notes (Signed)
 Emergency Medicine Observation Re-evaluation Note  Hunter [name] is a 89 y.o. male, seen on rounds today.  Pt initially presented to the ED for complaints of TOC Placement  Currently, the patient is resting comfortably.  Physical Exam  BP (!) 124/57   Pulse 68   Temp 98.3 F (36.8 C) (Oral)   Resp 16   Ht 6' (1.829 m)   Wt 97.5 kg   SpO2 92%   BMI 29.16 kg/m  General: No acute distress Cardiac: Well-perfused extremities Lungs: No respiratory distress Psych: Appropriate mood and affect  ED Course / MDM  EKG:EKG Interpretation Date/Time:  Tuesday August 24 2023 00:56:29 EST Ventricular Rate:  68 PR Interval:  202 QRS Duration:  200 QT Interval:  484 QTC Calculation: 514 R Axis:   -69  Text Interpretation: Atrial-sensed ventricular-paced rhythm Abnormal ECG When compared with ECG of 23-Jan-2023 17:47, PREVIOUS ECG IS PRESENT Confirmed by UNCONFIRMED, DOCTOR ([id number]), editor Lonell Face ([id number]) on 08/24/2023 7:41:44 AM  I have reviewed the labs performed to date as well as medications administered while in observation.  Recent changes in the last 24 hours include none.  Plan  Current plan is for placement.   Merwyn Katos, MD 09/20/23 [phone]

## 2023-09-20 NOTE — TOC Progression Note (Signed)
 Transition of Care [facility]) - Progression Note    Patient Details  Name: Hunter [name] MRN: [id number] Date of Birth: Jan 10, 1934  [facility]) CM/SW Contact  Margarito Liner, LCSW Phone Number: 09/20/2023, 9:14 AM  Clinical Narrative:   Per MAR, patient received prn Ativan and Zyprexa on 3/8.  Expected Discharge Plan:  (TBD) Barriers to Discharge: ED Patient/Family Requesting Placement but Has No Payor  Expected Discharge Plan and Services     Post Acute Care Choice:  (TBD) Living arrangements for the past 2 months: Skilled Nursing Facility                                       Social Determinants of Health (SDOH) Interventions SDOH Screenings   Food Insecurity: No Food Insecurity (06/13/2023)   Received from [facility]  Housing: Low Risk  (06/09/2023)  Transportation Needs: No Transportation Needs (06/13/2023)   Received from [facility]  Utilities: Not At Risk (01/24/2023)  Alcohol Screen: Low Risk  (07/06/2021)  Depression (PHQ2-9): Medium Risk (11/26/2022)  Financial Resource Strain: Low Risk  (06/13/2023)   Received from [facility]  Physical Activity: Insufficiently Active (06/13/2023)   Received from [facility]  Social Connections: Moderately Integrated (06/13/2023)   Received from [facility]  Stress: No Stress Concern Present (06/13/2023)   Received from [facility]  Tobacco Use: Low Risk  (08/23/2023)  Recent Concern: Tobacco Use - Medium Risk (06/14/2023)   Received from [facility] Literacy: Medium Risk (06/13/2023)   Received from [facility]    Readmission Risk Interventions     No data to display

## 2023-09-20 NOTE — ED Notes (Signed)
 Pt brief, pad, and linens changed.  Peri care performed.

## 2023-09-20 NOTE — ED Notes (Signed)
 Pt given bed bath by this RN and [place] EDT. Pt sat up for breakfast. Pt eating meal

## 2023-09-20 NOTE — ED Notes (Signed)
 Pt visualized working with PT at this time.

## 2023-09-21 DIAGNOSIS — E876 Hypokalemia: Secondary | ICD-10-CM | POA: Diagnosis not present

## 2023-09-21 NOTE — ED Notes (Signed)
 Pt attempting to get out of bed multiple times. Pt yelling and cussing at staff as they walk by the room. Pt attempted to hit staff when trying to redirect pt back to bed. Bed alarm applied at this time.

## 2023-09-21 NOTE — ED Notes (Signed)
 Pt soiled bed with urine. Pt placed in recliner, meds given, bedding changed and pt cleaned.  Pt placed into bed on 2 liters oxygen.

## 2023-09-21 NOTE — ED Notes (Signed)
 Patient cleaned up after an episode of urinary incontinence. Patient's linens were completely changed. Bed returned to lowest position with call bell in reach. Bed alarm activated. Patient given an ice water. Patient denies other needs at this time.

## 2023-09-21 NOTE — ED Provider Notes (Signed)
-----------------------------------------   5:03 AM on 09/21/2023 -----------------------------------------   Blood pressure 133/73, pulse 63, temperature 98 F (36.7 C), temperature source Oral, resp. rate 18, height 6' (1.829 m), weight 97.5 kg, SpO2 98%.  The patient is calm and cooperative at this time.  There have been no acute events since the last update.  Awaiting disposition plan from case management/social work.    Sejal Cofield, Layla Maw, DO 09/21/23 [phone]
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
## 2023-09-21 NOTE — ED Notes (Signed)
vol/toc placement.. 

## 2023-09-22 DIAGNOSIS — E876 Hypokalemia: Secondary | ICD-10-CM | POA: Diagnosis not present

## 2023-09-22 LAB — URINALYSIS, W/ REFLEX TO CULTURE (INFECTION SUSPECTED)
Bilirubin Urine: NEGATIVE
Glucose, UA: NEGATIVE mg/dL
Ketones, ur: NEGATIVE mg/dL
Nitrite: NEGATIVE
Protein, ur: 100 mg/dL — AB
Specific Gravity, Urine: 1.01 (ref 1.005–1.030)
WBC, UA: >50 WBC/hpf (ref 0–5)
pH: 6 (ref 5.0–8.0)

## 2023-09-22 NOTE — ED Notes (Addendum)
 Pt was repositioned in bed and spilled food cleaned by amazing ED techs. Pt provided with new linen and clean pads underneath the patient.

## 2023-09-22 NOTE — ED Notes (Signed)
 Several RN's and techs have informed this RN that the pt has tried to get out of bed and set off the alarms. Pt informed that he needs to remain in bed to reduce risk of injury from fall. Pt agreeable and cooperative at this time. Pt ABCs intact. RR even and unlabored. Pt in NAD. Bed in lowest locked position. Call bell in reach. Denies needs at this time.

## 2023-09-22 NOTE — ED Provider Notes (Signed)
-----------------------------------------   5:13 AM on 09/22/2023 -----------------------------------------   Blood pressure 125/66, pulse 65, temperature 98.3 F (36.8 C), temperature source Oral, resp. rate 18, height 6' (1.829 m), weight 97.5 kg, SpO2 100%.  The patient is calm and cooperative at this time.  There have been no acute events since the last update.  Awaiting disposition plan from [facility] team.   Janith Lima, MD 09/22/23 [phone]

## 2023-09-22 NOTE — ED Notes (Signed)
 This tech and NT Hunter [name] changed patients urine soaked brief. Pt is in bed resting comfortably in bed, bed in low and locked position, call light in reach.

## 2023-09-22 NOTE — ED Notes (Signed)
 Patient assisted to use toilet by this RN and Rosalie Doctor, RN after stating he needed to have a bowel movement. Patient had a large bowel movement. Patient was cleaned up and placed in a clean brief. Patient returned to bed with bed in lowest position. Call light within reach and bed alarm activated.

## 2023-09-22 NOTE — ED Notes (Signed)
Pt sat up to eat dinner.  

## 2023-09-22 NOTE — ED Notes (Signed)
 Pt ABCs intact. RR even and unlabored. Pt in NAD. Bed in lowest locked position. Call bell in reach. Denies needs at this time.

## 2023-09-22 NOTE — ED Notes (Signed)
 Pt currently eating dinner tray provided from AM RN. Pt in bed with bed alarms set. Pt ABCs intact. RR even and unlabored. Pt in NAD. Pt noted to be confused and hard of hearing, unable to answer orientation questions appropriately. No signs of agitation of this pt. Bed in lowest locked position. Call bell in reach. Denies needs at this time.   Past Medical History:  Diagnosis Date   Allergic rhinitis 02/02/2007   Anemia    Arthritis    "knees" (02/08/2017)   Chronic lower back pain    CKD (chronic kidney disease), stage III (HCC) 05/27/2011   Complete heart block 02/08/2017   Eczema    Essential hypertension 02/02/2007   Lasix 60 mg, losartan 50mg ,  Amlodipine 5mg --> 2.5 mg.  Usually have to repeat BP measurements  In the past-Maxzide 37.5-25mg .   GERD (gastroesophageal reflux disease) occasional   Hearing loss of both ears    Heart failure with preserved ejection fraction (HFpEF) 12/06/2013   History of skin cancer 03/26/2014   nose    Hx of echocardiogram    Echo (07/2013): Mild LVH, EF 55-60%, grade 1 diastolic dysfunction, mild LAE, PASP 23   Hyperlipidemia    Hypertension    Hypothyroidism    LBBB (left bundle branch block)    Left patella fracture 2018   "no OR" (02/08/2017)   Major depression in partial remission 02/02/2007   Amitriptyline 25mg  for sleep (stop when runs out of #10 04/2018(, zoloft 100-->150mg --> 200mg    Mild neurocognitive disorder 06/21/2019   Neuropathy (HCC) 11/02/2011   Nocturia    Peripheral vascular disease (HCC) LOWER EXTREMITIES   Spinal stenosis in cervical region 03/21/2010   Squamous cell skin cancer, penis: glans (HCC) 05/2010   Initial excision 11/11; recurrence, excision and laser Rx 9/13   Thrombocytopenia (HCC) 05/23/2012   Urinary frequency 09/05/2009   Possible BPH- see Artist Pais notes    Vitamin D deficiency 08/15/2008

## 2023-09-22 NOTE — ED Notes (Signed)
 Patient cleaned up after an episode of urinary incontinence. Patient placed in a clean brief. Patient placed in a comfortable position and bed was returned to lowest position. Call light within reach and bed alarm activated.

## 2023-09-22 NOTE — ED Notes (Signed)
 Pt agitated and trying to get out of bed. Pt educated on importance of staying in bed to prevent falls. Pt still trying to get out of bed multiple times after education.
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
## 2023-09-22 NOTE — ED Notes (Signed)
 This RN heard the bed alarm going off and found the pt attempting to get out of bed. Pt directed back into bed and alarm reset. RN then noted that the pt had defecated in his adult brief. Pt cleaned and provided with new brief afterwards.

## 2023-09-22 NOTE — ED Notes (Signed)
 Pt cleaned up after having a bowel movement. New brief and bed pad applied.

## 2023-09-22 NOTE — ED Notes (Signed)
 Pt resting in bed at this time, pt has not attempted to get back out of bed but staff closely monitoring pt and listening out for alarms.

## 2023-09-22 NOTE — ED Notes (Signed)
 Patient able to be taken off oxygen at this time. Patient sats 95-100% on room air.

## 2023-09-23 DIAGNOSIS — E876 Hypokalemia: Secondary | ICD-10-CM | POA: Diagnosis not present

## 2023-09-23 NOTE — ED Provider Notes (Signed)
 Emergency Medicine Observation Re-evaluation Note  Hunter [name] is a 89 y.o. male, seen on rounds today.  Pt initially presented to the ED for complaints of TOC Placement  Currently, the patient is resting comfortably.  Physical Exam  BP (!) 140/55   Pulse 76   Temp 97.6 F (36.4 C)   Resp 16   Ht 6' (1.829 m)   Wt 97.5 kg   SpO2 100%   BMI 29.16 kg/m  General: No acute distress Cardiac: Well-perfused extremities Lungs: No respiratory distress Psych: Appropriate mood and affect  ED Course / MDM  EKG:EKG Interpretation Date/Time:  Tuesday August 24 2023 00:56:29 EST Ventricular Rate:  68 PR Interval:  202 QRS Duration:  200 QT Interval:  484 QTC Calculation: 514 R Axis:   -69  Text Interpretation: Atrial-sensed ventricular-paced rhythm Abnormal ECG When compared with ECG of 23-Jan-2023 17:47, PREVIOUS ECG IS PRESENT Confirmed by UNCONFIRMED, DOCTOR ([id number]), editor Lonell Face ([id number]) on 08/24/2023 7:41:44 AM  I have reviewed the labs performed to date as well as medications administered while in observation.  Recent changes in the last 24 hours include none.  Plan  Current plan is for placement.   Merwyn Katos, MD 09/23/23 [phone]

## 2023-09-23 NOTE — ED Notes (Signed)
 Patient attempting to get out of bed. Pt redirected to stay in bed. Pt repeatedly swinging legs over the side of the bed. Pt readjusted in bed.

## 2023-09-23 NOTE — ED Notes (Signed)
vol/toc placement.. 

## 2023-09-23 NOTE — ED Notes (Signed)
 Patient with his legs over the railing, sitting up in bed. Pt attempting to get out of bed. Nursing staff attempting to calm patient down. Pt gripping the rails, and refusing to allow staff to help him to lay back in bed, and talking to staff thru his teeth.

## 2023-09-23 NOTE — ED Notes (Signed)
 Patient with both legs over the side of bed stating that he is ready to get out of here. Pt repositioned in bed, and pericare provided.

## 2023-09-23 NOTE — ED Notes (Signed)
 Pt's legs over the side of rail, as he is actively attempting to get out of bed. Pt redirected to bring legs back into bed.  Patient verbally aggressive with this rn and charge rn Marylene Land. Pt redirected. Pericare provided by this RN and charge RN, patient repositioned in bed and given a coke per request.

## 2023-09-23 NOTE — ED Notes (Signed)
 Checked on pt, put pt's gown back on, NT helped pull pt up in bed, bed alarm on, and offered pt something to eat. Pt declined.

## 2023-09-23 NOTE — ED Notes (Addendum)
 Patient bedding and pads replaced. Pericare provided, and pt repositioned in bed. Pt given a breakfast tray.

## 2023-09-23 NOTE — ED Notes (Signed)
Patient given a sandwich tray.
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
## 2023-09-24 DIAGNOSIS — E876 Hypokalemia: Secondary | ICD-10-CM | POA: Diagnosis not present

## 2023-09-24 MED ORDER — CEPHALEXIN 500 MG PO CAPS
500.0000 mg | ORAL_CAPSULE | Freq: Two times a day (BID) | ORAL | Status: AC
  Administered 2023-09-24 – 2023-09-29 (×10): 500 mg via ORAL
  Filled 2023-09-24 (×10): qty 1

## 2023-09-24 NOTE — ED Notes (Signed)
Bed alarm

## 2023-09-24 NOTE — ED Notes (Signed)
 Provided pt with dinner tray on the side table.

## 2023-09-24 NOTE — ED Notes (Signed)
Pt bedding and brief changed. ?

## 2023-09-24 NOTE — Progress Notes (Signed)
 PT Cancellation Note  Patient Details Name: Hunter [name] MRN: [id number] DOB: 12/04/1933   Cancelled Treatment:     Therapist in to see pt this pm. Pt pleasantly confused and politely declines any activity. Unable to redirect and encourage mobility. Will re-attempt next available date/time per POC.   Jannet Askew 09/24/2023, 3:09 PM

## 2023-09-24 NOTE — TOC Progression Note (Addendum)
 Transition of Care [facility]) - Progression Note    Patient Details  Name: Hunter [name] MRN: [id number] Date of Birth: 07/21/1933  [facility] of [facility]) CM/SW Contact  Margarito Liner, LCSW Phone Number: 09/24/2023, 9:42 AM  Clinical Narrative:  CSW called legal guardian. There is no court date yet for Guardian of the [place]. Guardian has not found placement for patient. She encouraged CSW to reach out to [facility] in [place]. CSW left voicemail for the admissions coordinator.   1:25 pm: CSW faxed referral to [facility] in [place], [place], [facility] [place], [facility] in [place], [facility] in [place], [facility] in [place], and [facility].   [facility] in [place] reviewed the referral and is unable to offer a bed.  Left voicemails at [facility] in [place], [facility] in [place], [facility] in [place], [facility] in [place], [employer] and [address], [address] in [place], [address] and [place], [facility] in [place], [place] at [facility] in [place], and [facility] in [place].   [facility] in [place], [facility] in [place], [facility] in [place], [facility] [address] in [place], [facility] fund[facility] in [place], [address] and [address], [facility] in [place], [place] in [place], Publishing rights manager at [facility] in [place], [place] in [place], [place] in [place], [facility] in [place], [place] and [address], [facility] and [place] [facility] [place], [facility] in [place], [address] and [place], [facility] in [place], [place] at [place], [facility] in [place], and [facility] in [place] do not have memory care units.  2:33 pm: [facility] is unable to offer a bed.  2:49 pm: [facility] in [place] does not have a memory care  unit.  Expected Discharge Plan:  (TBD) Barriers to Discharge: ED Patient/Family Requesting Placement but Has No Payor  Expected Discharge Plan and Services     Post Acute Care Choice:  (TBD) Living arrangements for the past 2 months: Skilled Nursing Facility                                       Social Determinants of Health (SDOH) Interventions SDOH Screenings   Food Insecurity: No Food Insecurity (06/13/2023)   Received from [facility]  Housing: Low Risk  (06/09/2023)  Transportation Needs: No Transportation Needs (06/13/2023)   Received from [facility]  Utilities: Low Risk  (06/13/2023)   Received from [facility]  Alcohol Screen: Low Risk  (07/06/2021)  Depression (PHQ2-9): Medium Risk (11/26/2022)  Financial Resource Strain: Low Risk  (06/13/2023)   Received from [facility]  Physical Activity: Insufficiently Active (06/13/2023)   Received from [facility]  Social Connections: Moderately Integrated (06/13/2023)   Received from [facility]  Stress: No Stress Concern Present (06/13/2023)   Received from [facility]  Tobacco Use: Low Risk  (08/23/2023)  Recent Concern: Tobacco Use - Medium Risk (06/14/2023)   Received from [facility] Literacy: Medium Risk (06/13/2023)   Received from [facility]    Readmission Risk Interventions     No data to display

## 2023-09-24 NOTE — ED Notes (Signed)
 Pt's legs put back on the bed, pt pulled up in the bed, and brief was changed.

## 2023-09-24 NOTE — ED Notes (Signed)
 Pt's bedding and brief changed by this RN and ER tech, Byrd Hesselbach. Bed alarm on.

## 2023-09-24 NOTE — ED Notes (Signed)
 Put pt's legs back in the bed and gave pt a snack.

## 2023-09-24 NOTE — ED Provider Notes (Signed)
 BP 124/61   Pulse 76   Temp 97.6 F (36.4 C) (Oral)   Resp 18   Ht 6' (1.829 m)   Wt 97.5 kg   SpO2 97%   BMI 29.16 kg/m  No updates.  Awaiting placement.   Willy Eddy, MD 09/24/23 2018

## 2023-09-24 NOTE — ED Notes (Signed)
 Pulled pt up in the bed with EDT, Melissa. Bed alarm on.

## 2023-09-25 DIAGNOSIS — E876 Hypokalemia: Secondary | ICD-10-CM | POA: Diagnosis not present

## 2023-09-25 LAB — URINE CULTURE: Culture: >=100000 — AB

## 2023-09-25 MED ORDER — ZIPRASIDONE MESYLATE 20 MG IM SOLR
INTRAMUSCULAR | Status: AC
Start: 2023-09-25 — End: 2023-09-26
  Filled 2023-09-25: qty 20

## 2023-09-25 MED ORDER — ZIPRASIDONE MESYLATE 20 MG IM SOLR
20.0000 mg | Freq: Once | INTRAMUSCULAR | Status: AC
  Administered 2023-09-25: 20 mg via INTRAMUSCULAR
  Filled 2023-09-25: qty 20

## 2023-09-25 NOTE — ED Notes (Signed)
 Disposable brief checked, dry at this time.
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
## 2023-09-25 NOTE — ED Notes (Signed)
 Pt bed alarming, pt moving toward edge of bed.  Assistance given with repositioning, peri care and dry clean brief provided.Pt reminded to stay in bed for safety. Pt stated understanding.

## 2023-09-25 NOTE — ED Provider Notes (Signed)
 Emergency Medicine Observation Re-evaluation Note  Physical Exam   BP 124/61   Pulse 76   Temp 97.6 F (36.4 C) (Oral)   Resp 18   Ht 6' (1.829 m)   Wt 97.5 kg   SpO2 97%   BMI 29.16 kg/m   Patient appears in no acute distress.  ED Course / MDM   No reported events during my shift at the time of this note.   Pt is awaiting dispo from consultants   Pilar Jarvis MD    Pilar Jarvis, MD 09/25/23 [phone]

## 2023-09-25 NOTE — ED Notes (Signed)
 Pt's brief changed. Pt pulled up in the bed.

## 2023-09-25 NOTE — ED Notes (Signed)
VOL/Still Pending TOC Placement 

## 2023-09-26 DIAGNOSIS — E876 Hypokalemia: Secondary | ICD-10-CM | POA: Diagnosis not present

## 2023-09-26 MED ORDER — MIDAZOLAM HCL (PF) 10 MG/2ML IJ SOLN
2.0000 mg | Freq: Once | INTRAMUSCULAR | Status: AC
  Administered 2023-09-26: 2 mg via NASAL
  Filled 2023-09-26: qty 2

## 2023-09-26 MED ORDER — LORAZEPAM 2 MG PO TABS
2.0000 mg | ORAL_TABLET | Freq: Once | ORAL | Status: AC
  Administered 2023-09-26: 2 mg via ORAL
  Filled 2023-09-26: qty 1

## 2023-09-26 MED ORDER — MIDAZOLAM HCL 2 MG/2ML IJ SOLN
2.0000 mg | Freq: Once | INTRAMUSCULAR | Status: AC
  Administered 2023-09-26: 2 mg via NASAL
  Filled 2023-09-26: qty 2

## 2023-09-26 NOTE — ED Notes (Signed)
 Pt to BSC to have BM. Pt x2 assist to [facility]. Pt given bath at this time. Full linen change. Pt back in bed. Warm blankets given at this time.

## 2023-09-26 NOTE — ED Notes (Signed)
 RN to bedside due to bed alarm going off. Pt attempting to get out of bed at this time. Pt back in bed and repositioned for comfort.

## 2023-09-26 NOTE — ED Notes (Signed)
 This RN and Shervirian NT at bedside, be alarming going off. Pt legs out of the bed. This RN and NT positioned pt in the bed. Pt brief also soiled at this time. Brief and pad changed. Pt now resting comfortably

## 2023-09-26 NOTE — ED Notes (Signed)
 Pt is calm and cooperative in bed watching TV. No needs expressed. CB within reach. Fall alarm is on. NAD.
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
## 2023-09-26 NOTE — ED Notes (Signed)
 This tech and Melissa,NT provided pt with peri care and linen change. Dry brief and hospital gown provided.

## 2023-09-26 NOTE — ED Notes (Signed)
 Pt throwing bed alarm and attempting to get out of bed. Pt cursing and agitated. Pt redirected at this time. Pt back in bed with bed alarm.

## 2023-09-26 NOTE — ED Notes (Signed)
 RN assisted pt back in bed at this time. Brief changed at this time. Pt adjusted in bed. Pt given cola at this time.
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
## 2023-09-26 NOTE — ED Provider Notes (Signed)
 Emergency Medicine Observation Re-evaluation Note  Hunter [name] is a 89 y.o. male, seen on rounds today.  Pt initially presented to the ED for complaints of TOC Placement  Currently, the patient is resting in bed. No reported issues from nursing team.   Physical Exam  BP 133/72   Pulse 63   Temp 97.7 F (36.5 C) (Oral)   Resp 18   Ht 6' (1.829 m)   Wt 97.5 kg   SpO2 95%   BMI 29.16 kg/m  Physical Exam General: Resting in bed  ED Course / MDM   No labs last 24 hours.  Plan  Current plan is for dispo per social work.    Trinna Post, MD 09/26/23 [phone]

## 2023-09-26 NOTE — ED Notes (Signed)
 Pt attempting to get out of bed. Pt not redirectable at this time. Pt cursing at staff. Pt agitated at this time. Pt offered snack and drink. Pt repositioned in bed. Pt unable to be redirected.

## 2023-09-27 DIAGNOSIS — E876 Hypokalemia: Secondary | ICD-10-CM | POA: Diagnosis not present

## 2023-09-27 DIAGNOSIS — F03918 Unspecified dementia, unspecified severity, with other behavioral disturbance: Secondary | ICD-10-CM | POA: Diagnosis not present

## 2023-09-27 LAB — BASIC METABOLIC PANEL
Anion gap: 9 (ref 5–15)
BUN: 21 mg/dL (ref 8–23)
CO2: 25 mmol/L (ref 22–32)
Calcium: 8.7 mg/dL — ABNORMAL LOW (ref 8.9–10.3)
Chloride: 106 mmol/L (ref 98–111)
Creatinine, Ser: 1.34 mg/dL — ABNORMAL HIGH (ref 0.61–1.24)
GFR, Estimated: 51 mL/min — ABNORMAL LOW (ref >60)
Glucose, Bld: 104 mg/dL — ABNORMAL HIGH (ref 70–99)
Potassium: 3.8 mmol/L (ref 3.5–5.1)
Sodium: 140 mmol/L (ref 135–145)

## 2023-09-27 LAB — VALPROIC ACID LEVEL: Valproic Acid Lvl: 21 ug/mL — ABNORMAL LOW (ref 50.0–100.0)

## 2023-09-27 LAB — AMMONIA: Ammonia: 27 umol/L (ref 9–35)

## 2023-09-27 MED ORDER — SERTRALINE HCL 50 MG PO TABS
150.0000 mg | ORAL_TABLET | Freq: Every day | ORAL | Status: DC
  Administered 2023-09-27 – 2023-11-29 (×59): 150 mg via ORAL
  Filled 2023-09-27 (×60): qty 3

## 2023-09-27 MED ORDER — OLANZAPINE 5 MG PO TABS
2.5000 mg | ORAL_TABLET | ORAL | Status: DC
Start: 2023-09-27 — End: 2023-11-19
  Administered 2023-09-27 – 2023-11-19 (×96): 2.5 mg via ORAL
  Filled 2023-09-27 (×98): qty 1

## 2023-09-27 NOTE — ED Notes (Signed)
 This tech heard pt bed alarn going off and went in pt rm. Pt was trying to get out of bed. Pt incontinent at this time. Bed linen changed and pt given bed bath by this tech and Selena Batten, Charity fundraiser. Pt given PM snack at this time. Pt eating without any difficulties.

## 2023-09-27 NOTE — ED Notes (Signed)
 Pt incontinent of urine.  RN and tech changed pt brief, linen, provided peri care.  Pt repositioned.

## 2023-09-27 NOTE — ED Notes (Signed)
vol/toc placement.. 

## 2023-09-27 NOTE — ED Notes (Signed)
 Hand dressing over skin tare changed.
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
## 2023-09-27 NOTE — ED Notes (Signed)
 Pt brief, linens changed, peri care provided with [place] NT. Breakfast tray provided and assisted with set up. Bed alarm in place
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
## 2023-09-27 NOTE — ED Provider Notes (Signed)
-----------------------------------------   8:15 PM on 09/27/2023 -----------------------------------------   Blood pressure (!) 115/54, pulse 64, temperature 97.9 F (36.6 C), resp. rate 18, height 6' (1.829 m), weight 97.5 kg, SpO2 100%.  The patient is calm and cooperative at this time.  There have been no acute events since the last update.  Awaiting disposition plan from case management/social work.   Delton Prairie, MD 09/27/23 2015
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
## 2023-09-27 NOTE — ED Notes (Signed)
 Pt given bed bath  and linin change by this RN and EDT, French Ana. Vitals updated. Snack given to pt.

## 2023-09-28 DIAGNOSIS — E876 Hypokalemia: Secondary | ICD-10-CM | POA: Diagnosis not present

## 2023-09-28 LAB — VALPROIC ACID LEVEL: Valproic Acid Lvl: 22 ug/mL — ABNORMAL LOW (ref 50.0–100.0)

## 2023-09-28 MED ORDER — MIDAZOLAM HCL 2 MG/2ML IJ SOLN
2.0000 mg | Freq: Once | INTRAMUSCULAR | Status: AC
  Administered 2023-09-28: 2 mg via INTRAMUSCULAR
  Filled 2023-09-28: qty 2

## 2023-09-28 NOTE — ED Notes (Signed)
 Pt still appears to be pretty calm at this time.

## 2023-09-28 NOTE — ED Provider Notes (Signed)
-----------------------------------------   6:20 AM on 09/28/2023 -----------------------------------------   Blood pressure (!) 122/53, pulse 66, temperature 98.2 F (36.8 C), temperature source Oral, resp. rate 17, height 6' (1.829 m), weight 97.5 kg, SpO2 100%.  The patient is calm and cooperative at this time.  There have been no acute events since the last update.  Awaiting disposition plan from Social Work team.   Irean Hong, MD 09/28/23 [phone]
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
## 2023-09-28 NOTE — ED Notes (Signed)
 Pt's brief was changed by this Clinical research associate and Catering manager. Bed placed in the lowest and locked position.

## 2023-09-28 NOTE — ED Notes (Addendum)
 Pt assisted with moving legs from edge of bed to center of bed.

## 2023-09-28 NOTE — ED Notes (Addendum)
 Legal guardian, Chance, successfully notified of use of restraint hold and medication administration.

## 2023-09-28 NOTE — ED Notes (Addendum)
 This RN entering room where charge RN, Statistician and another nurse trying to deescalate. Rover tech responded to room due to bed alarm going off. Pt was attempting to get out of bed. Pt became verbally aggressive reporting to rover someone took his ticket. Pt flailing arms and legs attempting to hit and kick staff. Pt yelling profanity repeatedly at staff. Security called to bedside. Manual hold applied for staff and pt safety of PRN medication given in right quadricep. Continued effort for therapeutic communication and deescalating techniques.

## 2023-09-28 NOTE — ED Notes (Signed)
 Pt agreeable to blood draw. Lab called for collection

## 2023-09-28 NOTE — ED Notes (Signed)
 Pt sitting in recliner eating PM snack at this time.

## 2023-09-28 NOTE — ED Notes (Addendum)
 Pt given breakfast tray

## 2023-09-28 NOTE — ED Notes (Signed)
 Pt refusing blood draw at this time.

## 2023-09-28 NOTE — ED Notes (Signed)
 Pt out of bed and at doorway. This RN, Administrator, arts at bedside. Pt assisted back into bed and bed alarm in place. Lights dimmed to promote comfort and relaxation.

## 2023-09-28 NOTE — ED Notes (Signed)
 RN heard bed alarm going off and found pt using bedside commode as walker, almost out of the room. Pt held and asked to turn around so he can get back into bed. Pt refused and attempted to push forward towards the door stating, "I'm getting the hell out of here!". This RN then called out for assistance and 2 additional RN's and 1 Tech came to help assist with guiding pt back to bed. Pt continued to refuse, kicking legs, grabbing IV stands, and anything he could get his hands on. Pt asked to let go of everything but then started grabbing at staff. Pt was placed in recliner and secondary RN went to get PRN Zyprexa for agitation. Pt kicked at staff, attempted to bite this RN, and called each staff member several inappropriate names, to include "Sorry fuckers", "poor workers" and continued to tell us how he doesn't "know how this place is in business with such terrible workers". After IM meds were given, pt continued to kick at [employer] before getting distracted about getting to 3 funerals coming up for his mother, father, and great grandmother. Shortly afterwards, pt was asked if he was remain calm for staff so we all can have a good night, pt said he will stay with the Tech because she was the only one with any sense, the rest of [place] could leave. All remaining staff left the room.

## 2023-09-28 NOTE — ED Notes (Signed)
 Pt with soiled brief and linens. Pt assisted to recliner by this Solicitor. New gown and brief provided. Linens changed. Alarm set in recliner

## 2023-09-28 NOTE — ED Notes (Signed)
 Bed alarm reset and pt re-centered. Pt ABCs intact. RR even and unlabored. Pt in NAD. Bed in lowest locked position with bed alarms set. Call bell in reach.
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
## 2023-09-28 NOTE — ED Notes (Signed)
 Pt attempting to get out of recliner unassisted. This RN and Sherivian EDT at bedside to assist pt back to bed. Bed alarm on. Meal tray sat up

## 2023-09-28 NOTE — ED Notes (Addendum)
 Pt bed alarm sounding. Pt attempting to get out of bed and yelling. This RN, Scientist, forensic and security at bedside. This RN able to deescalate pt and assist in up in bed. Pt provided meal tray and drink.

## 2023-09-28 NOTE — ED Notes (Addendum)
 Pt continues to be agitated and combative. Throwing tv remote at staff. Kicking, swinging, and pulling at cords in the room. Pt attempting to get out of recliner to go home, increasing chances of fall.

## 2023-09-28 NOTE — ED Notes (Signed)
 Pt still awaiting placement for care needs. Upon initial assessment of pt, RN notes that pt is still able to move all extremities independently. Pt currently watching television and just finished eating dinner. Pt denies having any physical pain but said he was worried about getting plots ordered for his death. When asked why he said that, the pt replied, "Well, when you get old like I am, almost 89 years old, you know you don't have much time left. I don't wanna wait until its too late to get that in order." RN encouraged the pt to not stress about it while here and asked if there was anything in this moment that would make him happy. The pt requested a coca-cola beverage. Pt ABCs intact. RR even and unlabored. Pt in NAD. Bed in lowest locked position with alarms set. Call bell in reach. Denies further needs at this time.

## 2023-09-28 NOTE — ED Notes (Signed)
 Pt given meal tray sitting in recliner

## 2023-09-29 DIAGNOSIS — E876 Hypokalemia: Secondary | ICD-10-CM | POA: Diagnosis not present

## 2023-09-29 MED ORDER — MIDAZOLAM HCL 2 MG/2ML IJ SOLN
2.0000 mg | Freq: Once | INTRAMUSCULAR | Status: AC
  Administered 2023-09-29: 2 mg via INTRAMUSCULAR
  Filled 2023-09-29: qty 2

## 2023-09-29 MED ORDER — MIDAZOLAM HCL 2 MG/2ML IJ SOLN
2.0000 mg | Freq: Once | INTRAMUSCULAR | Status: AC
  Administered 2023-09-29: 2 mg via INTRAMUSCULAR

## 2023-09-29 MED ORDER — OLANZAPINE 10 MG IM SOLR
INTRAMUSCULAR | Status: AC
  Filled 2023-09-29: qty 10

## 2023-09-29 MED ORDER — MIDAZOLAM HCL 2 MG/2ML IJ SOLN
INTRAMUSCULAR | Status: AC
  Filled 2023-09-29: qty 2

## 2023-09-29 NOTE — Progress Notes (Signed)
 OT Cancellation Note  Patient Details Name: Hunter [name] MRN: [id number] DOB: 01-Apr-1934   Cancelled Treatment:    Reason Eval/Treat Not Completed: Other (comment). Pt currently agitated, requiring multiple nursing staff members to de-escalate and received medication. RN requests therapies to hold at this time. OT will continue to follow.  Isabeau Mccalla L. Phynix Horton, OTR/L  09/29/23, 9:46 AM

## 2023-09-29 NOTE — ED Notes (Signed)
 Patient allowed this RN to change brief. Peri-care performed by this RN. New brief applied at this time. Patient denies any needs currently.

## 2023-09-29 NOTE — ED Notes (Signed)
 Pt awake and moving about on bed.

## 2023-09-29 NOTE — ED Notes (Signed)
 Resumed care from annie rn.  Pt awake and alert  meds given . Pt calm and cooperative.

## 2023-09-29 NOTE — Progress Notes (Signed)
 PT Cancellation Note  Patient Details Name: Hunter [name] MRN: [id number] DOB: 04-23-1934   Cancelled Treatment:    Reason Eval/Treat Not Completed: Other (comment): Pt currently agitated, requiring multiple nursing staff members to de-escalate and received medication. RN requests therapies to hold at this time. PT will re-attempt at later date/time.   Creed Copper Fairly, PT, DPT 09/29/23 11:12 AM

## 2023-09-29 NOTE — ED Notes (Signed)
 Patient attempting to get out of bed independently, RN Ally to bedside. Patient attempting to swing and kick at staff continuously. This RN and Industrial/product designer attempted to verbally de-escalate patient. Patient continues to cuss and hit at staff. Officer to bedside. Patient increases in combativeness with staff. MD Ray at bedside, order for versed IM placed. Given as ordered- reference MAR. Patient continues to cuss at staff and attempts to get out of bed.

## 2023-09-29 NOTE — TOC Progression Note (Signed)
 Transition of Care [facility]) - Progression Note    Patient Details  Name: Hunter [name] MRN: [id number] Date of Birth: 11-Apr-1934  [facility] CM/SW Contact  Margarito Liner, LCSW Phone Number: 09/29/2023, 3:57 PM  Clinical Narrative:  [facility] in [place] has not reviewed referral yet but will do so today. [facility] does not have any LTC beds at this time. [facility] did not receive the referral so CSW faxed again.   Expected Discharge Plan:  (TBD) Barriers to Discharge: ED Patient/Family Requesting Placement but Has No Payor  Expected Discharge Plan and Services     Post Acute Care Choice:  (TBD) Living arrangements for the past 2 months: Skilled Nursing Facility                                       Social Determinants of Health (SDOH) Interventions SDOH Screenings   Food Insecurity: No Food Insecurity (06/13/2023)   Received from [facility]  Housing: Low Risk  (06/09/2023)  Transportation Needs: No Transportation Needs (06/13/2023)   Received from [facility]  Utilities: Low Risk  (06/13/2023)   Received from [facility]  Alcohol Screen: Low Risk  (07/06/2021)  Depression (PHQ2-9): Medium Risk (11/26/2022)  Financial Resource Strain: Low Risk  (06/13/2023)   Received from [facility]  Physical Activity: Insufficiently Active (06/13/2023)   Received from [facility]  Social Connections: Moderately Integrated (06/13/2023)   Received from [facility]  Stress: No Stress Concern Present (06/13/2023)   Received from [facility]  Tobacco Use: Low Risk  (08/23/2023)  Recent Concern: Tobacco Use - Medium Risk (06/14/2023)   Received from [facility] Literacy: Medium Risk (06/13/2023)   Received from [facility]    Readmission Risk Interventions     No data to display

## 2023-09-29 NOTE — ED Notes (Signed)
 Pt agitated.  Pt trying to get out bed, cursing at staff.  Security in with pt.  Meds given   dr tan aware.

## 2023-09-29 NOTE — ED Notes (Signed)
 Pt awake and resting peacefully in the bed. Pt ABCs intact. RR even and unlabored. Pt in NAD. Bed in lowest locked position. Call bell in reach. Denies needs at this time.

## 2023-09-29 NOTE — ED Notes (Signed)
 Pt still sleeping peacefully in the bed. Pt ABCs intact. RR even and unlabored. Pt in NAD. Bed in lowest locked position with exit alarms on and audible. Call bell in reach.

## 2023-09-29 NOTE — ED Notes (Signed)
 RN into room due to bed alarm going off. PT attempting to get out of bed. RN explained to patient how he cannot get out of bed at this time due to high fall risk and will ask additional staff for assistance. Pt very verbally aggressive and physically aggressive at that time, swinging at staff with clenched fists and kicking, teeth gritting and cursing. RN requested additional help from staff which arrived at bedside. Verbal attempts at de-escalation resulting in increase in aggressive behaviors. Pt now kicking and swinging at multiple staff members. Pt disoriented to time, place and situation.

## 2023-09-29 NOTE — ED Notes (Signed)
 Pt still verbally upset about not being able to get up and leave hospital.

## 2023-09-29 NOTE — ED Notes (Signed)
 Pt agitated, pt swinging and kicking staff, pt curing  dr tan aware   more meds given.  Siderails x 4

## 2023-09-29 NOTE — ED Provider Notes (Signed)
-----------------------------------------   5:53 AM on 09/29/2023 -----------------------------------------   Blood pressure (!) 110/59, pulse 65, temperature 98 F (36.7 C), temperature source Oral, resp. rate 20, height 6' (1.829 m), weight 97.5 kg, SpO2 97%.  The patient is calm and cooperative at this time.  There have been no acute events since the last update.  Awaiting disposition plan from Social Work team.   Irean Hong, MD 09/29/23 [phone]

## 2023-09-29 NOTE — ED Notes (Signed)
 Pt observed sleeping very peacefully in his bed. Pt has not attempted to get out of bed and has not attempted to get out of bed for about 2-3 hours now. Pt ABCs intact. RR even and unlabored. Pt in NAD. Bed in lowest locked position with exit alarms set. Call bell in reach.

## 2023-09-29 NOTE — ED Notes (Signed)
 This rn sat with pt after meds, pt calming down   pt lying back on bed.  Side rails up x 4.  Floor mats in place.  Pt has yellow socks on.  Bed alarm on.

## 2023-09-29 NOTE — ED Notes (Signed)
 Pt awake and moving about on the bed.  Siderails up x4.

## 2023-09-29 NOTE — ED Notes (Signed)
 Patient bed sheets, gown, and brief changed at this time. Patient requiring 2 person assist, but can roll partially from side to side to assist with changing brief and sheets.

## 2023-09-29 NOTE — ED Notes (Signed)
 Pt awake now but calmly resting in bed. Pt ABCs intact. RR even and unlabored. Pt in NAD. Bed in lowest locked position. Call bell in reach.
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
## 2023-09-29 NOTE — ED Notes (Signed)
 Pt appears to be more relaxed now. Pt has not attempted to get out of bed in about 1 hour. Pt continues to be frequently rounded on to ensure safety from fall.

## 2023-09-29 NOTE — ED Notes (Signed)
 Pt continues to sit on side of bed kicking at staff and attempting to hit when staff try to assist pt. Pt will not follow directions. Assisted by ED staff and security to get back into the bed in a safe position to decrease risk of fall.

## 2023-09-30 NOTE — ED Notes (Signed)
 Pt given warm blanket, repositioned in bed. Sitter at bedside

## 2023-09-30 NOTE — ED Notes (Signed)
 Pt bed alarm going off pt sitting on edge of bed, assisted back in bed by RN and [employer]

## 2023-09-30 NOTE — ED Notes (Signed)
 Pt moved to recliner chair with alarm in place. Wheels locked and footrest elevated. Pt alert and calm at this time. Talkative with no distress noted.
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
## 2023-09-30 NOTE — ED Notes (Signed)
 Breakfast tray given.

## 2023-09-30 NOTE — ED Notes (Signed)
 Pt resting quietly with eyes closed, bed alarm on, fall pads on floor, respirations equal and unlabored.  Will continue to monitor.

## 2023-09-30 NOTE — ED Notes (Signed)
Changed pt brief.

## 2023-09-30 NOTE — ED Notes (Signed)
 This tech, NT [place], and NT [place] changed patients brief. Patient had large BM. Patients bed linens, gown, and brief were changed. Peri care was completed. Patient was given warm blankets and is resting peacefully in bed with no other needs stated at this time.

## 2023-09-30 NOTE — ED Notes (Signed)
 Pt assisted back to stretcher with 2 person assist. Pt weak but talkative and pleasant. Provided for safety.

## 2023-09-30 NOTE — ED Notes (Signed)
 Pt moved to room 22 in his hospital bed. Tolerated well.

## 2023-09-30 NOTE — ED Provider Notes (Signed)
-----------------------------------------   5:33 AM on 09/30/2023 -----------------------------------------   Blood pressure (!) 116/53, pulse 61, temperature 97.9 F (36.6 C), temperature source Oral, resp. rate 18, height 6' (1.829 m), weight 97.5 kg, SpO2 99%.  The patient is calm and cooperative at this time.  There have been no acute events since the last update.  Awaiting disposition plan from Social Work team.   Irean Hong, MD 09/30/23 [phone]

## 2023-09-30 NOTE — ED Notes (Signed)
 Pt provided with dinner tray.

## 2023-10-01 NOTE — ED Notes (Signed)
 Patient bandages changed on left arm. Xeroform applied to arm as previous guaze was stuck to patients skin tear and painful to remove. Patient tolerated well.

## 2023-10-01 NOTE — ED Provider Notes (Signed)
-----------------------------------------   7:11 AM on 10/01/2023 -----------------------------------------   Blood pressure 137/71, pulse 63, temperature 98.1 F (36.7 C), temperature source Oral, resp. rate 16, height 6' (1.829 m), weight 97.5 kg, SpO2 98%.  The patient is calm and cooperative at this time.  There have been no acute events since the last update.  Awaiting disposition plan from case management/social work.    Shantel Helwig, Layla Maw, DO 10/01/23 [phone]

## 2023-10-01 NOTE — ED Notes (Signed)
 Offered pt something to eat he declined, offered pt something to drink, pt states he would like a cola.

## 2023-10-01 NOTE — ED Notes (Signed)
Physical therapy at bedside with patient.

## 2023-10-01 NOTE — ED Notes (Signed)
 Patient with soiled brief- changed by this RN and [place], NT. Barrier cream applied during peri-care. Patient able to help by rolling side to side.Peri-care performed by staff. Patient repositioned in bed. Denies any further needs at this time.
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
## 2023-10-01 NOTE — ED Notes (Signed)
 Pt brief is dry, checked to see if pt needed to be changed.

## 2023-10-02 NOTE — ED Notes (Signed)
 Administered medications to pt, pt able to take medicines, pt held cup on his own. Repositioned pt on bed, placed pillow on his back. No other needs at present.

## 2023-10-02 NOTE — ED Notes (Signed)
Patient is vol pending TOC placement 

## 2023-10-02 NOTE — ED Notes (Signed)
 Assisted tech with Patient bed brief change and getting back in the bed, bed in low position and bed alarm in operation.

## 2023-10-02 NOTE — ED Notes (Signed)
 Pt agitated, shaking bed rails, yelling.  RN and nurse tech were successful in verbally deescalating pt.  Pt verbalizes visual hallucinations stating "there was a little kitty that came up here and was freezing to death.  I kept him warm right here. (While pointing under blanket)

## 2023-10-02 NOTE — ED Notes (Signed)
 Provided pt with dinner tray and a cup of milk no other needs

## 2023-10-02 NOTE — ED Notes (Signed)
 Pt seem agitated, pt brief is dry, provided pt a soda and a dinner tray. Pt is eating apple sauce currently.

## 2023-10-02 NOTE — ED Notes (Signed)
 Pt provided with lunch tray. Pt sitting up eating.

## 2023-10-02 NOTE — ED Provider Notes (Signed)
-----------------------------------------   8:14 AM on 10/02/2023 -----------------------------------------   Blood pressure (!) 148/60, pulse 62, temperature 98.1 F (36.7 C), temperature source Oral, resp. rate 17, height 1.829 m (6'), weight 97.5 kg, SpO2 98%.  The patient is calm and cooperative at this time.  There have been no acute events since the last update.  Awaiting disposition plan from [facility] team.   Loleta Rose, MD 10/02/23 [phone]

## 2023-10-02 NOTE — ED Notes (Signed)
 Changed Pt brief and gown.

## 2023-10-02 NOTE — ED Notes (Signed)
 Changed pt's gown, and provided perineal care pt incontinent of urine.

## 2023-10-02 NOTE — ED Notes (Signed)
 Patient was changed at this time.

## 2023-10-02 NOTE — ED Notes (Signed)
 RN noted pt brief is wet.  Brief and linens changed, peri care provided.
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
## 2023-10-02 NOTE — ED Notes (Signed)
 Pt b

## 2023-10-02 NOTE — ED Notes (Signed)
 Patient took all po morning medications and is watching tv, He is pleasant and cooperative.

## 2023-10-02 NOTE — ED Notes (Signed)
 Hospital meal provided, pt tolerated w/o complaints.  Waste discarded appropriately.

## 2023-10-03 MED ORDER — ZIPRASIDONE MESYLATE 20 MG IM SOLR: 10.0000 mg | Freq: Once | INTRAMUSCULAR | Status: DC

## 2023-10-03 MED ORDER — HALOPERIDOL LACTATE 5 MG/ML IJ SOLN
5.0000 mg | Freq: Once | INTRAMUSCULAR | Status: AC
  Administered 2023-10-03: 5 mg via INTRAMUSCULAR
  Filled 2023-10-03: qty 1

## 2023-10-03 NOTE — ED Notes (Signed)
 This tech and RN ambulated pt to bathroom. Brief was changed and pericare performed. New brief placed on pt and pt is now resting in recliner. Cola provided per pt request. No other needs at this time.

## 2023-10-03 NOTE — ED Notes (Signed)
 Patient seems to have calmed down, lying in bed comfortably.  ED tech able to obtain vitals at this time.  Patient alert and oriented to self which is baseline; patient does not recall falling.  Patient denies any pain at this time.

## 2023-10-03 NOTE — ED Notes (Signed)
 VOL  PENDING  TOC  PLACEMENT

## 2023-10-03 NOTE — ED Notes (Signed)
 This RN in to room due to bed alarm sounding; patient found sitting upright w/ legs thrown over bed rail.  This RN inquires about what patient is doing, states, "I gotta shit."  This RN and EDT, Marilynne Halsted assisted patient to toilet w/ walker.  Patient unsteady w/ walker but is manageable w/ 2 person assist.  Patient w/ small BM and then assisted back into bed; all fall risk interventions in place.

## 2023-10-03 NOTE — ED Notes (Signed)
 Pt taken to the bathroom at this time. Brief changed. Peri care preformed.

## 2023-10-03 NOTE — ED Notes (Signed)
Pt given meal tray and beverage.

## 2023-10-03 NOTE — ED Notes (Signed)
Pt bed linen changed 

## 2023-10-03 NOTE — ED Notes (Signed)
 This RN in to access patient due to bed alarm sounding; patient has turned over in bed but was not trying to get out of bed at this time.  Patient appears to have woken up extremely agitated w/ aggressive behavior toward this RN, speaking derogative and swinging at me.

## 2023-10-03 NOTE — ED Notes (Signed)
 Pt sleeping breakfast tray placed at bedside

## 2023-10-03 NOTE — ED Notes (Addendum)
 As I am mixing up PRN med, bed alarm is sounding again, before this RN gets into room, hear a loud thump.  Patient found sitting upright in floor on floor pads cussing; remains verbally and physically aggressive w/ staff at this time..  Patient does not appear to have hit his head.  Patient alert and oriented to self only which is patient baseline.  Multiple security officers able to pick patient up from sitting position on ground and placed back into bed.  MD, Anner Crete and Charge RN made aware.  See new orders.

## 2023-10-03 NOTE — ED Notes (Signed)
 Vitals obtained and snack given.

## 2023-10-03 NOTE — ED Notes (Signed)
 Pt combative and uncorporative at this time. Cannot obtain vitals, will attempt again later.

## 2023-10-03 NOTE — ED Notes (Signed)
 Pt back in bed at this time. All High fall bundle is in place. Bed alarm on. No skid socks on. Fall risk bracelet on.

## 2023-10-03 NOTE — ED Notes (Signed)
 This tech and [place], NT performed peri care and linen change. Pt has a dry brief and gown. alarm on.

## 2023-10-03 NOTE — ED Notes (Signed)
 Pt expressing the bed was wet, writer changed sheets, blankets, brief, and gown. Pt was calm and cooperative at this time. Pt expressing having discomfort with over grown hair and beard. Beard and hair was trimmed.

## 2023-10-04 ENCOUNTER — Emergency Department

## 2023-10-04 DIAGNOSIS — R059 Cough, unspecified: Secondary | ICD-10-CM | POA: Diagnosis not present

## 2023-10-04 LAB — CBC WITH DIFFERENTIAL/PLATELET
Abs Immature Granulocytes: 0.02 10*3/uL (ref 0.00–0.07)
Basophils Absolute: 0 10*3/uL (ref 0.0–0.1)
Basophils Relative: 0 %
Eosinophils Absolute: 0.1 10*3/uL (ref 0.0–0.5)
Eosinophils Relative: 1 %
HCT: 36.2 % — ABNORMAL LOW (ref 39.0–52.0)
Hemoglobin: 12 g/dL — ABNORMAL LOW (ref 13.0–17.0)
Immature Granulocytes: 0 %
Lymphocytes Relative: 24 %
Lymphs Abs: 1.2 10*3/uL (ref 0.7–4.0)
MCH: 26.7 pg (ref 26.0–34.0)
MCHC: 33.1 g/dL (ref 30.0–36.0)
MCV: 80.6 fL (ref 80.0–100.0)
Monocytes Absolute: 0.4 10*3/uL (ref 0.1–1.0)
Monocytes Relative: 7 %
Neutro Abs: 3.3 10*3/uL (ref 1.7–7.7)
Neutrophils Relative %: 68 %
Platelets: 103 10*3/uL — ABNORMAL LOW (ref 150–400)
RBC: 4.49 MIL/uL (ref 4.22–5.81)
RDW: 15.5 % (ref 11.5–15.5)
WBC: 5 10*3/uL (ref 4.0–10.5)
nRBC: 0 % (ref 0.0–0.2)

## 2023-10-04 LAB — RESP PANEL BY RT-PCR (RSV, FLU A&B, COVID)  RVPGX2
Influenza A by PCR: NEGATIVE
Influenza B by PCR: NEGATIVE
Resp Syncytial Virus by PCR: NEGATIVE
SARS Coronavirus 2 by RT PCR: NEGATIVE

## 2023-10-04 LAB — URINALYSIS, ROUTINE W REFLEX MICROSCOPIC
Bilirubin Urine: NEGATIVE
Glucose, UA: NEGATIVE mg/dL
Ketones, ur: NEGATIVE mg/dL
Nitrite: NEGATIVE
Protein, ur: 30 mg/dL — AB
Specific Gravity, Urine: 1.009 (ref 1.005–1.030)
WBC, UA: >50 WBC/hpf (ref 0–5)
pH: 6 (ref 5.0–8.0)

## 2023-10-04 LAB — BASIC METABOLIC PANEL
Anion gap: 7 (ref 5–15)
BUN: 27 mg/dL — ABNORMAL HIGH (ref 8–23)
CO2: 27 mmol/L (ref 22–32)
Calcium: 8.7 mg/dL — ABNORMAL LOW (ref 8.9–10.3)
Chloride: 104 mmol/L (ref 98–111)
Creatinine, Ser: 1.35 mg/dL — ABNORMAL HIGH (ref 0.61–1.24)
GFR, Estimated: 51 mL/min — ABNORMAL LOW (ref >60)
Glucose, Bld: 123 mg/dL — ABNORMAL HIGH (ref 70–99)
Potassium: 4.1 mmol/L (ref 3.5–5.1)
Sodium: 138 mmol/L (ref 135–145)

## 2023-10-04 LAB — VALPROIC ACID LEVEL: Valproic Acid Lvl: 24 ug/mL — ABNORMAL LOW (ref 50.0–100.0)

## 2023-10-04 MED ORDER — CIPROFLOXACIN HCL 500 MG PO TABS
500.0000 mg | ORAL_TABLET | Freq: Two times a day (BID) | ORAL | Status: AC
  Administered 2023-10-04 – 2023-10-10 (×14): 500 mg via ORAL
  Filled 2023-10-04 (×15): qty 1

## 2023-10-04 MED ORDER — DIVALPROEX SODIUM 250 MG PO DR TAB
250.0000 mg | DELAYED_RELEASE_TABLET | Freq: Three times a day (TID) | ORAL | Status: DC
  Administered 2023-10-04 – 2023-10-09 (×14): 250 mg via ORAL
  Filled 2023-10-04 (×15): qty 1

## 2023-10-04 NOTE — ED Notes (Signed)
 This RN and Zack, EDT got the patient up and walked him with a walker to the toilet. Patient was somewhat unsteady on feet but with two person assist stable to walk to toilet and back.

## 2023-10-04 NOTE — TOC Progression Note (Signed)
 Transition of Care [facility]) - Progression Note    Patient Details  Name: Hunter [name] MRN: [id number] Date of Birth: [birth year]  Transition of Care [facility]) CM/SW Contact  Margarito Liner, LCSW Phone Number: 10/04/2023, 4:16 PM  Clinical Narrative:  Left voicemail for admissions coordinator at [facility] in [place]. [facility] did not receive referral. They provided another fax number to send it to.   Expected Discharge Plan:  (TBD) Barriers to Discharge: ED Patient/Family Requesting Placement but Has No Payor  Expected Discharge Plan and Services     Post Acute Care Choice:  (TBD) Living arrangements for the past 2 months: Skilled Nursing Facility                                       Social Determinants of Health (SDOH) Interventions SDOH Screenings   Food Insecurity: No Food Insecurity (06/13/2023)   Received from [facility]  Housing: Low Risk  (06/09/2023)  Transportation Needs: No Transportation Needs (06/13/2023)   Received from [facility]  Utilities: Low Risk  (06/13/2023)   Received from [facility]  Alcohol Screen: Low Risk  (07/06/2021)  Depression (PHQ2-9): Medium Risk (11/26/2022)  Financial Resource Strain: Low Risk  (06/13/2023)   Received from [facility]  Physical Activity: Insufficiently Active (06/13/2023)   Received from [facility]  Social Connections: Moderately Integrated (06/13/2023)   Received from [facility]   Stress: No Stress Concern Present (06/13/2023)   Received from [facility]  Tobacco Use: Low Risk  (08/23/2023)  Recent Concern: Tobacco Use - Medium Risk (06/14/2023)   Received from [facility] Literacy: Medium Risk (06/13/2023)   Received from [facility]    Readmission Risk Interventions     No data to display

## 2023-10-04 NOTE — ED Notes (Addendum)
 Pt provided peri care at this time. Pt adjusted in bed. Pt provided clean sheet. Pt eating breakfast at this time.

## 2023-10-04 NOTE — ED Notes (Signed)
 Patient received dinner at this time.

## 2023-10-04 NOTE — ED Provider Notes (Signed)
 I am informed by the nursing staff the patient had foul-smelling and cloudy urine.  He was recently treated for UTI with Keflex but I reviewed his culture with E. coli resistant to cefazolin.  Repeat urine concerning for infection we will send it for another culture and start the patient on a course of ciprofloxacin.  Also add on a valproic acid level which is remaining low.  No leukocytosis or signs of sepsis.  Will continue to monitor as we provide another round of antibiotics for acute cystitis and work on his placement.  ----------------------------------------- 3:37 PM on 10/04/2023 -----------------------------------------   Blood pressure (!) 121/55, pulse 66, temperature 98 F (36.7 C), temperature source Oral, resp. rate 16, height 6' (1.829 m), weight 97.5 kg, SpO2 98%.  The patient is calm and cooperative at this time.  There have been no acute events since the last update.  Awaiting disposition plan from case management/social work.   Delton Prairie, MD 10/04/23 [phone]
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
## 2023-10-04 NOTE — ED Notes (Signed)
 Hospital meal provided, pt tolerated w/o complaints.  Waste discarded appropriately.

## 2023-10-04 NOTE — Progress Notes (Signed)
 PT Cancellation Note  Patient Details Name: Hunter [name] MRN: [id number] DOB: September 17, 1933   Cancelled Treatment:    Reason Eval/Treat Not Completed: Patient declined, no reason specified.  Chart reviewed and attempted to see pt.  Upon arrival, pt cordial then swiftly turned agitated and stated he wanted the railings down.  Pt advised that this could not happen and was offered lunch tray.  Pt pushed lunch tray away and nursing/security in room at conclusion.  Will re-attempt at later date/time as medically appropriate.  Pt does seem to do better in the AM sessions according to security staff.   Nolon Bussing, PT, DPT Physical Therapist - [facility]  10/04/23, 2:29 PM

## 2023-10-04 NOTE — ED Notes (Signed)
 Pt becoming verbally aggressive and boisterous. Pt attempting to throw objects at staff. Pt attempting to get out of bed. Pt unable to be redirected. RN assessed for needs. Pt offered food and drink. Pt offered snack. Pt extremely agitated at this time RN gave therapeudic medication at this time. See MAR.

## 2023-10-04 NOTE — ED Notes (Signed)
 Patient dinner tray disposed of at this time.

## 2023-10-04 NOTE — ED Notes (Signed)
 Pt given snack.

## 2023-10-04 NOTE — ED Provider Notes (Signed)
-----------------------------------------   5:18 AM on 10/04/2023 -----------------------------------------   Blood pressure (!) 109/55, pulse 70, temperature (!) 97.5 F (36.4 C), temperature source Oral, resp. rate 16, height 6' (1.829 m), weight 97.5 kg, SpO2 96%.  The patient is calm and cooperative at this time.  There have been no acute events since the last update.  Awaiting disposition plan from Social Work team.   Irean Hong, MD 10/04/23 [phone]

## 2023-10-05 MED ORDER — LORAZEPAM 2 MG/ML IJ SOLN
2.0000 mg | Freq: Once | INTRAMUSCULAR | Status: AC
  Administered 2023-10-05: 2 mg via INTRAMUSCULAR
  Filled 2023-10-05: qty 1

## 2023-10-05 MED ORDER — DIPHENHYDRAMINE HCL 50 MG/ML IJ SOLN
50.0000 mg | Freq: Once | INTRAMUSCULAR | Status: AC
  Administered 2023-10-05: 50 mg via INTRAMUSCULAR
  Filled 2023-10-05: qty 1

## 2023-10-05 MED ORDER — HALOPERIDOL LACTATE 5 MG/ML IJ SOLN
5.0000 mg | Freq: Once | INTRAMUSCULAR | Status: AC
  Administered 2023-10-05: 5 mg via INTRAMUSCULAR
  Filled 2023-10-05: qty 1

## 2023-10-05 NOTE — ED Notes (Signed)
 Pt sleeping.

## 2023-10-05 NOTE — ED Notes (Signed)
 Pt is sleep due to being medicated earlier in the day, will obtain vital signs once pt wakes up.

## 2023-10-05 NOTE — ED Notes (Addendum)
 Pt resting on bed at this time after injections. Wounds on hands exposed after pt pulled bandages off.

## 2023-10-05 NOTE — ED Notes (Signed)
 vol/pending placement.Marland Kitchen

## 2023-10-05 NOTE — ED Notes (Signed)
 Dinner tray and beverage provided. Meal left on bedside table.

## 2023-10-05 NOTE — ED Notes (Signed)
 This tech, NT Nettie Elm, and RN Amy changed patients wet brief. New dry brief and new dry chux put down. Pt returned to recliner, chair alarm is on, and requested a cold soda. Patient voices no other concerns at this time.
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
## 2023-10-05 NOTE — ED Notes (Signed)
 Pt provided with lunch tray. Pt sitting up eating.

## 2023-10-05 NOTE — ED Notes (Signed)
 Pt's wounds/skin tears cleaned and redressed.  Pt calm and cooperative.  Pt given a fresh gown and pt is on bed now.
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
## 2023-10-05 NOTE — ED Notes (Signed)
Pt given breakfast tray and beverage.  

## 2023-10-06 LAB — URINE CULTURE: Culture: >=100000 — AB

## 2023-10-06 NOTE — ED Notes (Signed)
 Hospital meal provided, pt tolerated w/o complaints.  Waste discarded appropriately.

## 2023-10-06 NOTE — ED Notes (Signed)
 VOL/ TOC

## 2023-10-06 NOTE — ED Notes (Signed)
VOL/pending placement 

## 2023-10-06 NOTE — ED Notes (Signed)
Pt given lunch tray and beverage 

## 2023-10-06 NOTE — ED Notes (Signed)
 This NT and Jazmine RN changed pt's linens, brief, and gown. Pt's bed alarm is turned on and pt is resting comfortably in bed.

## 2023-10-06 NOTE — ED Notes (Signed)
 Snack given at this time by this tech

## 2023-10-06 NOTE — ED Notes (Signed)
 This tech and Allly RN cleaned pt up after urinary incontinence. New chux pad and brief were placed. Pt is now resting in the bed at this time.

## 2023-10-06 NOTE — ED Notes (Signed)
 Pt has been cooperative with med administration, vital signs, linen and brief changes, as well as with physical therapy today. Pt has no exhibited any signs of aggression from 0700-1500 today.

## 2023-10-06 NOTE — ED Notes (Signed)
 Patient incontinent of urine. Brief and linen changed. Patient cooperative with care.
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
## 2023-10-06 NOTE — ED Notes (Signed)
Patient received breakfast tray 

## 2023-10-06 NOTE — ED Notes (Signed)
 Breakfast provided.

## 2023-10-07 NOTE — ED Notes (Signed)
 Patient continuously attempting to get out of bed. Cursing at staff demanding to leave and have his belongings returned. Staff explained multiple times that he cannot leave at this time. Patient threatening staff. PRN IM zyprexa to left deltoid

## 2023-10-07 NOTE — ED Notes (Signed)
Vol /pending placement 

## 2023-10-07 NOTE — ED Provider Notes (Signed)
-----------------------------------------   6:42 AM on 10/07/2023 -----------------------------------------   Blood pressure (!) 100/50, pulse 61, temperature 98 F (36.7 C), resp. rate 18, height 1.829 m (6'), weight 97.5 kg, SpO2 95%.  The patient is calm and cooperative at this time.  There have been no acute events since the last update.  Awaiting disposition plan from [facility] team.   Loleta Rose, MD 10/07/23 [phone]

## 2023-10-07 NOTE — ED Notes (Signed)
 Pt eating dinner at this time

## 2023-10-08 MED ORDER — ACETAMINOPHEN 325 MG PO TABS
650.0000 mg | ORAL_TABLET | Freq: Four times a day (QID) | ORAL | Status: DC | PRN
  Administered 2023-10-08 – 2023-11-06 (×4): 650 mg via ORAL
  Filled 2023-10-08 (×4): qty 2

## 2023-10-08 MED ORDER — LIDOCAINE HCL URETHRAL/MUCOSAL 2 % EX GEL
1.0000 | Freq: Once | CUTANEOUS | Status: AC
  Administered 2023-10-08: 1 via TOPICAL
  Filled 2023-10-08: qty 10

## 2023-10-08 NOTE — TOC Transition Note (Signed)
 Transition of Care [facility]) - Discharge Note   Patient Details  Name: Hunter [name] MRN: [id number] Date of Birth: 05-05-1934  Transition of [facility]) CM/SW Contact:  [name], RN Phone Number: 10/08/2023, 11:46 AM   Clinical Narrative:  Patient sleeping in his hospital bed in ED room 22. Report from nurse Sue Lush is that he's mostly pleasant during the day, then at about 1500 begins to sundown, then has a tendency to become aggressive and difficult to redirect. Having difficulties with placement due to aggressive behaviors. TOC to continue to follow and look for placement.        Barriers to Discharge: ED Patient/Family Requesting Placement but Has No Payor   Patient Goals and CMS Choice            Discharge Placement                       Discharge Plan and Services Additional resources added to the After Visit Summary for       Post Acute Care Choice:  (TBD)                               Social Drivers of Health (SDOH) Interventions SDOH Screenings   Food Insecurity: No Food Insecurity (06/13/2023)   Received from [facility]  Housing: Low Risk  (06/09/2023)  Transportation Needs: No Transportation Needs (06/13/2023)   Received from [facility]  Utilities: Low Risk  (06/13/2023)   Received from [facility]  Alcohol Screen: Low Risk  (07/06/2021)  Depression (PHQ2-9): Medium Risk (11/26/2022)  Financial Resource Strain: Low Risk  (06/13/2023)   Received from [facility]  Physical Activity: Insufficiently Active (06/13/2023)   Received from [facility]  Social Connections: Moderately Integrated (06/13/2023)   Received from [facility]  Stress: No Stress Concern Present (06/13/2023)   Received from [facility]  Tobacco Use: Low Risk  (08/23/2023)  Recent Concern: Tobacco Use - Medium Risk (06/14/2023)   Received from [facility] Literacy: Medium Risk (06/13/2023)   Received from [facility]      Readmission Risk Interventions     No data to display

## 2023-10-08 NOTE — ED Notes (Signed)
 Pt brief changed, pericare provided, face washed, new gown and repositioned in bed. Pt resting with lights off now.
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
## 2023-10-08 NOTE — ED Provider Notes (Signed)
 Emergency Medicine Observation Re-evaluation Note  Physical Exam   BP (!) 108/50   Pulse 66   Temp 98.2 F (36.8 C) (Oral)   Resp 18   Ht 6' (1.829 m)   Wt 97.5 kg   SpO2 94%   BMI 29.16 kg/m   Patient appears in no acute distress.  ED Course / MDM   No reported events during my shift at the time of this note.   Pt is awaiting dispo from consultants   Pilar Jarvis MD    Pilar Jarvis, MD 10/08/23 [phone]

## 2023-10-08 NOTE — TOC Progression Note (Signed)
 Transition of Care [facility]) - Progression Note    Patient Details  Name: Hunter [name] MRN: [id number] Date of Birth: March 19, 1934  Transition of Care [facility]) CM/SW Contact  Colin Broach, LCSW Phone Number: 10/08/2023, 1:14 PM  Clinical Narrative:    CSW called [facility] and spoke with admission coordinator, Herold Harms (phone:  [phone]) to follow-up on admission for patient.  Selena Batten states that they're not able to take patient due to behaviors.  CSW expressed that patient has been doing fine while here at [facility].  Kim agreed to take another look at him.  CSW faxed updated clinicals and PT notes (fax#:  [phone]).    CSW also phoned [facility] to follow-up on admission for patient.  CSW spoke with Lavell Luster (phone [phone])  who states that she's the coordinator.  She reports that she doesn't have any information on patient and requested clinicals.  CSW faxed information to [phone] per Stanton Kidney.  TOC to continue to follow for placement.   Expected Discharge Plan:  (TBD) Barriers to Discharge: ED Patient/Family Requesting Placement but Has No Payor  Expected Discharge Plan and Services     Post Acute Care Choice:  (TBD) Living arrangements for the past 2 months: Skilled Nursing Facility                                       Social Determinants of Health (SDOH) Interventions SDOH Screenings   Food Insecurity: No Food Insecurity (06/13/2023)   Received from [facility]  Housing: Low Risk  (06/09/2023)  Transportation Needs: No Transportation Needs (06/13/2023)   [facility]  Utilities: Low Risk  (06/13/2023)   Received from [facility]  Alcohol Screen: Low Risk  (07/06/2021)  Depression (PHQ2-9): Medium Risk (11/26/2022)  Financial Resource Strain: Low Risk  (06/13/2023)   Received from [facility]  Physical Activity: Insufficiently Active (06/13/2023)   Received from [facility]  Social Connections: Moderately  Integrated (06/13/2023)   Received from [facility]  Stress: No Stress Concern Present (06/13/2023)   Received from [facility]  Tobacco Use: Low Risk  (08/23/2023)  Recent Concern: Tobacco Use - Medium Risk (06/14/2023)   Received from [facility] Literacy: Medium Risk (06/13/2023)   Received from [facility]    Readmission Risk Interventions     No data to display
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
## 2023-10-08 NOTE — ED Notes (Signed)
pt recieved snack and drink 
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
## 2023-10-08 NOTE — ED Notes (Signed)
 Pericare provided and linen change by EDT

## 2023-10-08 NOTE — ED Notes (Signed)
 This RN checked patient's brief at this time. Patient had an episode of urinary incontinence in brief. Patient cleaned up by this RN and EDT Paige. A new brief was placed on the patient. Patient returned to a comfortable position. Patient denies other needs at this time.

## 2023-10-08 NOTE — ED Notes (Signed)
 Lunch provided.

## 2023-10-08 NOTE — ED Notes (Signed)
 Pt fed self 100% of breakfast tray.

## 2023-10-08 NOTE — ED Notes (Signed)
 Patient given dinner tray.

## 2023-10-08 NOTE — ED Notes (Signed)
 This RN took over assignment of patient. Patient sleeping at this time. Chest rise and fall noted. Even and unlabored RR. Environment secure and safe for the patient. Ongoing monitoring by this RN and safety rounder.

## 2023-10-09 LAB — CBC WITH DIFFERENTIAL/PLATELET
Abs Immature Granulocytes: 0.04 10*3/uL (ref 0.00–0.07)
Basophils Absolute: 0 10*3/uL (ref 0.0–0.1)
Basophils Relative: 1 %
Eosinophils Absolute: 0.2 10*3/uL (ref 0.0–0.5)
Eosinophils Relative: 4 %
HCT: 35.9 % — ABNORMAL LOW (ref 39.0–52.0)
Hemoglobin: 12.2 g/dL — ABNORMAL LOW (ref 13.0–17.0)
Immature Granulocytes: 1 %
Lymphocytes Relative: 31 %
Lymphs Abs: 1.7 10*3/uL (ref 0.7–4.0)
MCH: 26.3 pg (ref 26.0–34.0)
MCHC: 34 g/dL (ref 30.0–36.0)
MCV: 77.5 fL — ABNORMAL LOW (ref 80.0–100.0)
Monocytes Absolute: 0.5 10*3/uL (ref 0.1–1.0)
Monocytes Relative: 9 %
Neutro Abs: 3 10*3/uL (ref 1.7–7.7)
Neutrophils Relative %: 54 %
Platelets: 104 10*3/uL — ABNORMAL LOW (ref 150–400)
RBC: 4.63 MIL/uL (ref 4.22–5.81)
RDW: 15.6 % — ABNORMAL HIGH (ref 11.5–15.5)
WBC: 5.5 10*3/uL (ref 4.0–10.5)
nRBC: 0 % (ref 0.0–0.2)

## 2023-10-09 LAB — BASIC METABOLIC PANEL WITH GFR
Anion gap: 9 (ref 5–15)
BUN: 23 mg/dL (ref 8–23)
CO2: 24 mmol/L (ref 22–32)
Calcium: 8.8 mg/dL — ABNORMAL LOW (ref 8.9–10.3)
Chloride: 103 mmol/L (ref 98–111)
Creatinine, Ser: 1.49 mg/dL — ABNORMAL HIGH (ref 0.61–1.24)
GFR, Estimated: 45 mL/min — ABNORMAL LOW (ref >60)
Glucose, Bld: 112 mg/dL — ABNORMAL HIGH (ref 70–99)
Potassium: 3.6 mmol/L (ref 3.5–5.1)
Sodium: 136 mmol/L (ref 135–145)

## 2023-10-09 LAB — VALPROIC ACID LEVEL: Valproic Acid Lvl: 43 ug/mL — ABNORMAL LOW (ref 50.0–100.0)

## 2023-10-09 LAB — MAGNESIUM: Magnesium: 2.1 mg/dL (ref 1.7–2.4)

## 2023-10-09 MED ORDER — ZIPRASIDONE MESYLATE 20 MG IM SOLR
INTRAMUSCULAR | Status: AC
Start: 2023-10-09 — End: 2023-10-09
  Administered 2023-10-09: 10 mg via INTRAMUSCULAR
  Filled 2023-10-09: qty 20

## 2023-10-09 MED ORDER — ZIPRASIDONE MESYLATE 20 MG IM SOLR: 10.0000 mg | Freq: Once | INTRAMUSCULAR | Status: AC

## 2023-10-09 MED ORDER — ZIPRASIDONE MESYLATE 20 MG IM SOLR
INTRAMUSCULAR | Status: AC
  Filled 2023-10-09: qty 20

## 2023-10-09 MED ORDER — MIDAZOLAM HCL 2 MG/2ML IJ SOLN
2.0000 mg | Freq: Once | INTRAMUSCULAR | Status: AC
  Administered 2023-10-09: 2 mg via INTRAMUSCULAR
  Filled 2023-10-09: qty 2

## 2023-10-09 NOTE — ED Notes (Signed)
 This RN assisted lab technician in getting lab draws as Pt was being verbally aggressive towards her. This Pt was diverted manners.

## 2023-10-09 NOTE — ED Notes (Addendum)
 Patient changed after this RN found brief to be wet with urine. Patient placed in a new brief and a new paper pad was placed under the patient. A new sheet was placed on the patient's bed. The patient was aggressive with staff during linen change, swinging his arms at stiff and trying to dig his nails into staff. 2 Tax adviser, this RN, and EDT Misty Stanley assisted in holding extremities while completing the linen change. Patient continues to scream at staff and swing his arms in bed after linen change completion. MD Ward made aware.

## 2023-10-09 NOTE — ED Notes (Signed)
 Pt given lunch tray.

## 2023-10-09 NOTE — ED Notes (Signed)
 Pt became very combative while changing his sheets and brief.  Pt kicking, scratching and trying to hit staff.  Security at bedside for assist.  Pts TV turned on.  Pt laying in bed post medication

## 2023-10-09 NOTE — ED Notes (Signed)
PT VOL/PENDING TOC PLACEMENT.

## 2023-10-09 NOTE — ED Notes (Signed)
 Pt given breakfast tray

## 2023-10-09 NOTE — ED Notes (Signed)
 Pt had manual restraints on previous RN shift, this RN did an assessment on Pt and Pt had some skin tears bilateral arms. Pt is at baseline for musculoskeletal usage. He cannot ambulate w/o a walker and a nurse assist, that is baseline for Pt. Arms arms are WNL for Pt ROM. Prev RN documented manual restraint assessment as this RN was not present for the restraints.
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
## 2023-10-09 NOTE — ED Notes (Addendum)
 Pt sleeping in bed at this time.  Verified bed alarm is on and is on correct setting.

## 2023-10-09 NOTE — ED Notes (Signed)
 Lab called stated they needed a re-collect on labs, Pt is a hard stick, so asked if we can get a lab technician to come re-draw to ensure they're drawn correctly. Stated they will send a lab tech.

## 2023-10-09 NOTE — ED Notes (Signed)
 Pericare applied with off going nurse, and new brief applied to Pt.

## 2023-10-09 NOTE — ED Provider Notes (Signed)
-----------------------------------------   2:47 AM on 10/09/2023 -----------------------------------------   Blood pressure 119/60, pulse 67, temperature 97.6 F (36.4 C), temperature source Oral, resp. rate 16, height 6' (1.829 m), weight 97.5 kg, SpO2 94%.  Patient increasingly agitated, trying to strike staff.  Due to his dementia, verbal redirection is unsuccessful.  Patient given 2 mg of IM Versed due to him being a danger to himself and others.  Psychiatry has also been reconsulted to help with medication adjustments.  They last saw him 5 days ago and the medications that were ordered at that time do not seem to be helping and staff report that they feel his behavior is worsening.   Hunter [name], Layla Maw, DO 10/09/23 [phone]

## 2023-10-10 MED ORDER — LORAZEPAM 2 MG/ML IJ SOLN
1.0000 mg | Freq: Once | INTRAMUSCULAR | Status: AC
  Administered 2023-10-10: 1 mg via INTRAMUSCULAR
  Filled 2023-10-10: qty 1

## 2023-10-10 MED ORDER — MIDAZOLAM HCL (PF) 10 MG/2ML IJ SOLN
2.0000 mg | Freq: Once | INTRAMUSCULAR | Status: AC
  Administered 2023-10-10: 2 mg via NASAL
  Filled 2023-10-10: qty 2

## 2023-10-10 MED ORDER — DIVALPROEX SODIUM 500 MG PO DR TAB
500.0000 mg | DELAYED_RELEASE_TABLET | Freq: Three times a day (TID) | ORAL | Status: DC
  Administered 2023-10-10 – 2023-11-20 (×117): 500 mg via ORAL
  Filled 2023-10-10 (×118): qty 1

## 2023-10-10 NOTE — ED Notes (Signed)
 Pt provided with breakfast tray.

## 2023-10-10 NOTE — ED Notes (Signed)
 TTS cart placed in pt room at this time. Lights turned on and pt informed of plan

## 2023-10-10 NOTE — ED Notes (Signed)
 Report given to [facility].

## 2023-10-10 NOTE — ED Provider Notes (Signed)
 Emergency Medicine Observation Re-evaluation Note  Physical Exam   BP 128/61   Pulse 74   Temp 98.1 F (36.7 C) (Oral)   Resp 18   Ht 6' (1.829 m)   Wt 97.5 kg   SpO2 96%   BMI 29.16 kg/m   Patient appears in no acute distress.  ED Course / MDM   No reported events during my shift at the time of this note.   Pt is awaiting dispo from consultants   Pilar Jarvis MD    Pilar Jarvis, MD 10/10/23 [phone]

## 2023-10-10 NOTE — ED Notes (Signed)
 Pt yelling and trying to get out of bed. Pt provided with water and pt moved back into bed and repositioned.

## 2023-10-10 NOTE — ED Notes (Signed)
 Pt wound redressed on left and right wrist area.

## 2023-10-10 NOTE — ED Notes (Signed)
 This tech and Sam Gaffer cleaned pt up after urinary incontinence. Pt's linen, chux pad, and brief were changed. Pt laying in bed at this time. Pt is restless. Bed alarm on.

## 2023-10-10 NOTE — ED Notes (Signed)
 EDT Hunter [name] given meal tray

## 2023-10-10 NOTE — ED Notes (Signed)
 VOL/Pending TOC Placement

## 2023-10-10 NOTE — Consult Note (Cosign Needed Addendum)
  This provider attempted to reassess patient due to reported increased aggression and agitation.  Care team is seeking medication recommendations due to behaviors.  Patient has a documented history related to aggression with dementia, major depressive disorder, generalized anxiety disorder and adjustment disorder.  Cynda Acres  observed resting in bed.  Prior to assessment cart having technical difficulties,  It was reported that patient was having a hard time hearing this provider. (Tele-assessment chart is not reconnecting.)   Medication adjustment was discussed with attending psychiatrist's Zouev.  Emergency room provider Bradler MD increase Depakote. 1000 mg to 1500 mg daily as was discussed.  Valproic acid level to be collected in 4 days- orders are in.  Dementia related aggression: Major depressive disorder:  Recent adjustment to Depakote ER.  Patient to take Depakote 500 mg 3 times daily -Valproic acid due April 3 -Continue Zoloft 150 mg daily -Continue Zyprexa 2.5 mg twice daily-continue to monitor QTc as documented history related to prolongation, cardiology to follow closely  Documented additional medication options are to  "Increase Depakote or utilize ativan which do no prolong Qtc (patient does have chronic thrombocytopenia and Depakote can worsen thromobocytopenia so his platelets would need to be monitored, this the main reason neurology and neurosurgery due to like using Depakote, it is also a second/third line agent from psych for agitation). Ativan could be tried but is usually disinhibiting and makes things worse for dementia patient. And If patient needed an IM shot, medically the safest option is Ativan (although may make things worse and be disinhibiting, it does sometimes work though) and alternative would be Zyprexa IM 5-10 mg (out of IM antipsychotics lowest risk for Qtc prolongation). The Ativan and Zyprexa should not be combined IM as will often lead to respiratory  suppression/aspiration later."  As noted by psychiatrist.

## 2023-10-10 NOTE — ED Notes (Signed)
Pt repositioned again.  

## 2023-10-10 NOTE — ED Notes (Signed)
 Pt given dinner tray.
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
## 2023-10-10 NOTE — ED Notes (Signed)
 Pt brief soaked and changed. Pt placed in clean brief and repositioned.

## 2023-10-10 NOTE — ED Notes (Signed)
 Pt given all morning meds, pt very confused and cussing but not at staff. Pt worried about his mail. Pt did take all meds with no issues. Pt cleaned up and clean brief, chux, linens changed and clean gown placed on pt. Pt given bed bath. Pt denies any other needs. All trash cleaned out of pt room and bedside table cleaned.

## 2023-10-10 NOTE — ED Notes (Signed)
 Pt given pm snack and beverage.

## 2023-10-11 MED ORDER — LORAZEPAM 2 MG/ML IJ SOLN
2.0000 mg | Freq: Once | INTRAMUSCULAR | Status: AC
  Administered 2023-10-11: 2 mg via INTRAMUSCULAR
  Filled 2023-10-11: qty 1

## 2023-10-11 NOTE — TOC Progression Note (Signed)
 Transition of Care [facility]) - Progression Note    Patient Details  Name: Hunter [name] MRN: [id number] Date of Birth: May 12, 1934  Transition of Care [facility]) CM/SW Contact  [name], RN Phone Number: 10/11/2023, 3:43 PM  Clinical Narrative:    Pt having a good day today and worked well with OT- no aggressive behaviors during OT session. SNF workup sent to [facility] in [place], [place].    Expected Discharge Plan:  (TBD) Barriers to Discharge: ED Patient/Family Requesting Placement but Has No Payor  Expected Discharge Plan and Services     Post Acute Care Choice:  (TBD) Living arrangements for the past 2 months: Skilled Nursing Facility                                       Social Determinants of Health (SDOH) Interventions SDOH Screenings   Food Insecurity: No Food Insecurity (06/13/2023)   Received from [facility]  Housing: Low Risk  (06/09/2023)  Transportation Needs: No Transportation Needs (06/13/2023)   Received from [facility]  Utilities: Low Risk  (06/13/2023)   Received from [facility]  Alcohol Screen: Low Risk  (07/06/2021)  Depression (PHQ2-9): Medium Risk (11/26/2022)  Financial Resource Strain: Low Risk  (06/13/2023)   Received from [facility]  Physical Activity: Insufficiently Active (06/13/2023)   Received from [facility]  Social Connections: Moderately Integrated (06/13/2023)   Received from [facility]  Stress: No Stress Concern Present (06/13/2023)   Received from [facility]  Tobacco Use: Low Risk  (08/23/2023)  Recent Concern: Tobacco Use - Medium Risk (06/14/2023)   Received from [facility] Literacy: Medium Risk (06/13/2023)   Received from [facility]    Readmission Risk Interventions     No data to display

## 2023-10-11 NOTE — ED Notes (Signed)
 Clean brief and fresh linens provided.
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
## 2023-10-11 NOTE — ED Notes (Signed)
 Patient yelling and hollering at [place], [place] and attempted to kick and hit multiple times. Patient also aggressive with this RN attempting to grab arm, punch, and kick with leg multiple times. Copywriter, advertising witness. Attempted to deescalate patient but only continues to verbally and physically escalate.

## 2023-10-11 NOTE — ED Notes (Addendum)
 Charge RN entered pts room after hearing pt yelling at staff. Pt was attempting to get our of bed and nurse and tech were trying to explain to pt that he needed to stay in bed so he did not fall. Pt verbally and physically aggressive with staff, trying to hit and kick staff. Pt stated to tech that he was going to get out of bed and "piss on your leg". Pt reminded that this behavior is not appropriate. Order for PRN medication obtained by primary RN. Pt continues to be verbally aggressive with staff at this time.

## 2023-10-11 NOTE — ED Notes (Signed)
 This tech, with assistance from [place], EDT, changed patient's gown, sheet, blanket, and bedpad due to patient having a bowel movement. Brief put on patient. Patient given a bath with bath wipes.

## 2023-10-11 NOTE — ED Notes (Signed)
Breakfast placed at side. 
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
## 2023-10-11 NOTE — ED Notes (Addendum)
 Patient continuously attempting to get out of bed. Patient needs to be repositioned and scooted up in bed many times. Patient sometimes cooperative and other times gets aggressive and attempts to hit/kick/dig fingernails into staff/security.

## 2023-10-11 NOTE — ED Provider Notes (Signed)
-----------------------------------------   6:10 AM on 10/11/2023 -----------------------------------------   Blood pressure (!) 111/59, pulse 78, temperature 98.3 F (36.8 C), temperature source Oral, resp. rate 18, height 6' (1.829 m), weight 97.5 kg, SpO2 91%.  The patient is calm and cooperative at this time.  There have been no acute events since the last update.  Awaiting disposition plan from case management/social work.    Harm Jou, Layla Maw, DO 10/11/23 [phone]
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
## 2023-10-12 MED ORDER — ZIPRASIDONE MESYLATE 20 MG IM SOLR
10.0000 mg | Freq: Once | INTRAMUSCULAR | Status: AC
  Administered 2023-10-12: 10 mg via INTRAMUSCULAR
  Filled 2023-10-12: qty 20

## 2023-10-12 NOTE — ED Notes (Signed)
 Pt provided with additional beverage at this time to keep pt from moving back and forth in bed.

## 2023-10-12 NOTE — ED Notes (Signed)
 Received report from ally, rn

## 2023-10-12 NOTE — TOC Progression Note (Signed)
 Transition of Care [facility]) - Progression Note    Patient Details  Name: Hunter [name] MRN: [id number] Date of Birth: 1933-07-17  [facility]) CM/SW Contact  [name], RN Phone Number: 10/12/2023, 12:43 PM  Clinical Narrative:    SNF workup sent to [facility] yesterday. No response as of yet. TOC to continue to work towards DC.    Expected Discharge Plan:  (TBD) Barriers to Discharge: ED Patient/Family Requesting Placement but Has No Payor  Expected Discharge Plan and Services     Post Acute Care Choice:  (TBD) Living arrangements for the past 2 months: Skilled Nursing Facility                                       Social Determinants of Health (SDOH) Interventions SDOH Screenings   Food Insecurity: No Food Insecurity (06/13/2023)   Received from [facility]  Housing: Low Risk  (06/09/2023)  Transportation Needs: No Transportation Needs (06/13/2023)   Received from [facility]  Utilities: Low Risk  (06/13/2023)   Received from [facility]  Alcohol Screen: Low Risk  (07/06/2021)  Depression (PHQ2-9): Medium Risk (11/26/2022)  Financial Resource Strain: Low Risk  (06/13/2023)   Received from [facility]  Physical Activity: Insufficiently Active (06/13/2023)   Received from [facility]  Social Connections: Moderately Integrated (06/13/2023)   Received from [facility]  Stress: No Stress Concern Present (06/13/2023)   Received from [facility]  Tobacco Use: Low Risk  (08/23/2023)  Recent Concern: Tobacco Use - Medium Risk (06/14/2023)   Received from [facility] Literacy: Medium Risk (06/13/2023)   Received from [facility]    Readmission Risk Interventions     No data to display
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
## 2023-10-12 NOTE — ED Notes (Signed)
 Fresh sheets, brief and blanket provided as pt had been incontinent of urine. Sitting up in bed, pleasant and eating breakfast.

## 2023-10-12 NOTE — ED Notes (Signed)
 Pt was getting OOB and saying "I need to go get my car". Helped by EDP to get pt back in bed. Pt then raised voice to [facility], stating "you don't know what you're doing!". Pt back in bed. Bed alarm on.

## 2023-10-12 NOTE — ED Notes (Signed)
 Vol toc placement

## 2023-10-12 NOTE — ED Notes (Signed)
 Pt laying in bed comfortably. No signs of agitation or aggression towards staff. Pt ABCs intact. RR even and unlabored. Pt in NAD. Bed in lowest locked position. Call bell in reach. Denies needs at this time.

## 2023-10-12 NOTE — ED Notes (Signed)
 ED RN assuming care of this pt. Pt still awaiting placement. With no new complaints. Pt ABCs intact. RR even and unlabored. Pt in NAD but beginning to move around a lot in his bed. Bed in lowest locked position with bed alarms on. Pt in front of nurses station. Pt provided with cola beverage by secondary RN.   Past Medical History:  Diagnosis Date   Allergic rhinitis 02/02/2007   Anemia    Arthritis    "knees" (02/08/2017)   Chronic lower back pain    CKD (chronic kidney disease), stage III (HCC) 05/27/2011   Complete heart block 02/08/2017   Eczema    Essential hypertension 02/02/2007   Lasix 60 mg, losartan 50mg ,  Amlodipine 5mg --> 2.5 mg.  Usually have to repeat BP measurements  In the past-Maxzide 37.5-25mg .   GERD (gastroesophageal reflux disease) occasional   Hearing loss of both ears    Heart failure with preserved ejection fraction (HFpEF) 12/06/2013   History of skin cancer 03/26/2014   nose    Hx of echocardiogram    Echo (07/2013): Mild LVH, EF 55-60%, grade 1 diastolic dysfunction, mild LAE, PASP 23   Hyperlipidemia    Hypertension    Hypothyroidism    LBBB (left bundle branch block)    Left patella fracture 2018   "no OR" (02/08/2017)   Major depression in partial remission 02/02/2007   Amitriptyline 25mg  for sleep (stop when runs out of #10 04/2018(, zoloft 100-->150mg --> 200mg    Mild neurocognitive disorder 06/21/2019   Neuropathy (HCC) 11/02/2011   Nocturia    Peripheral vascular disease (HCC) LOWER EXTREMITIES   Spinal stenosis in cervical region 03/21/2010   Squamous cell skin cancer, penis: glans (HCC) 05/2010   Initial excision 11/11; recurrence, excision and laser Rx 9/13   Thrombocytopenia (HCC) 05/23/2012   Urinary frequency 09/05/2009   Possible BPH- see Artist Pais notes    Vitamin D deficiency 08/15/2008

## 2023-10-12 NOTE — ED Notes (Signed)
 Pt provided with additional beverage to take with evening medications. Pt ABCs intact. RR even and unlabored. Pt in NAD. Bed in lowest locked position with alarms on. Pt denies further needs at this time.

## 2023-10-12 NOTE — ED Provider Notes (Signed)
-----------------------------------------   5:39 AM on 10/12/2023 -----------------------------------------   Blood pressure 122/63, pulse 71, temperature 97.7 F (36.5 C), temperature source Oral, resp. rate 16, height 6' (1.829 m), weight 97.5 kg, SpO2 96%.  The patient is calm and cooperative at this time.  Patient required IM calming medication for agitation not able to be verbally directed.  IM Geodon was given with good effect and patient calmly resting since.  Awaiting disposition plan from Social Work team.   Irean Hong, MD 10/12/23 [phone]

## 2023-10-13 MED ORDER — MIDAZOLAM HCL 2 MG/2ML IJ SOLN
2.0000 mg | Freq: Once | INTRAMUSCULAR | Status: AC
  Administered 2023-10-13: 2 mg via INTRAMUSCULAR

## 2023-10-13 MED ORDER — MIDAZOLAM HCL 2 MG/2ML IJ SOLN
INTRAMUSCULAR | Status: AC
Start: 2023-10-13 — End: 2023-10-13
  Filled 2023-10-13: qty 2
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
## 2023-10-13 NOTE — ED Provider Notes (Signed)
-----------------------------------------   7:30 AM on 10/13/2023 -----------------------------------------   Blood pressure (!) 148/71, pulse 62, temperature 97.8 F (36.6 C), temperature source Oral, resp. rate 18, height 6' (1.829 m), weight 97.5 kg, SpO2 99%.  The patient is calm and cooperative at this time.  There have been no acute events since the last update.  Awaiting disposition plan from case management/social work.    Adaja Wander, Layla Maw, DO 10/13/23 0730

## 2023-10-13 NOTE — ED Notes (Signed)
 Pt brief saturated in urine. This RN and Ariel NT cleaned pt, applied barrier ointment, new brief put on, new bed chucks and sheets placed at this time. Pt given cola and sandwich tray. Denies any other needs at this time
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
## 2023-10-13 NOTE — ED Notes (Signed)
 This tech and pt's RN Rosalie Doctor changed pt brief and bed linen, barrier cream has also been placed on pt's private are while changing. Pt also provided with [place] sandwich tray for snack and a beverage. Pt has no other needs at the moment.

## 2023-10-13 NOTE — TOC Progression Note (Signed)
 Transition of Care [facility]) - Progression Note    Patient Details  Name: Hunter [name] MRN: [id number] Date of Birth: 05-05-1934  Transition of Care [facility]) CM/SW Contact  Colin Broach, LCSW Phone Number: 10/13/2023, 6:47 PM  Clinical Narrative:    CSW sent faxes to the following [facility] facilities: [facility] (fax#: [phone])  [facility] (fax #: [phone])  [facility], [facility], [place], [facility] (fax #: [phone]).  CSW also phoned Tammy and lvm at [phone].  Babette Relic is listed as contact for  [facility], [place] and [place].    TOC to continue to follow for placement.  Expected Discharge Plan:  (TBD) Barriers to Discharge: ED Patient/Family Requesting Placement but Has No Payor  Expected Discharge Plan and Services     Post Acute Care Choice:  (TBD) Living arrangements for the past 2 months: Skilled Nursing Facility                                       Social Determinants of Health (SDOH) Interventions SDOH Screenings   Food Insecurity: No Food Insecurity (06/13/2023)   Received from [facility]  Housing: Low Risk  (06/09/2023)  Transportation Needs: No Transportation Needs (06/13/2023)   Received from [facility]  Utilities: Low Risk  (06/13/2023)   [facility] from [facility]  Alcohol Screen: Low Risk  (07/06/2021)  Depression (PHQ2-9): Medium Risk (11/26/2022)  Financial Resource Strain: Low Risk  (06/13/2023)   Received from [facility]  Physical Activity: Insufficiently Active (06/13/2023)   Received from [facility]  Social Connections: Moderately Integrated (06/13/2023)   Received from [facility]  Stress: No Stress Concern Present (06/13/2023)   [facility]  Tobacco Use: Low Risk  (08/23/2023)  Recent Concern: Tobacco Use - Medium Risk (06/14/2023)   Received from [facility] Literacy: Medium Risk (06/13/2023)   [facility]     Readmission Risk Interventions     No data to display

## 2023-10-13 NOTE — ED Notes (Signed)
 Pt bed soiled w/ urine. Pt cleaned, lbed linen changed. New brief and gown placed. Pt provided w/ warm blankets.

## 2023-10-13 NOTE — ED Notes (Signed)
 This RN and Tracey NT at bedside, with assistance from security. Pt soiled with feces. Full linen change at this time including sheet, brief, incontinence pad, gown, and blanket. Pt very combative during this process and not redirectable.

## 2023-10-13 NOTE — ED Notes (Signed)
 Pt ABCs intact. RR even and unlabored. Pt in NAD. Bed in lowest locked position with alarms set. Denies needs at this time.

## 2023-10-13 NOTE — ED Notes (Addendum)
 Pt repositioned from foot of bed, back to laying down, and still agitated. Pt still attempting to scoot himself to foot of bed and standing up.

## 2023-10-13 NOTE — ED Notes (Signed)
 Pt swearing at EMS workers and patients who do not stop to help him stand up. Pt told that he is not able to stand up at this time because there is not currently enough staff to assist him in trying to walk anywhere. When pt was informed that he was okay to use his adult brief to relieve himself, the pt then yelled out, " If you don't help me up right now, I'm going to take a shit all over this floor. Just help me up dammit!" RN told pt that using that language will not be tolerated, and pt stated, " Well help me stand up". RN unable to redirect pt and standing here is now starting to affect this RN's ability to perform care to other patients.

## 2023-10-13 NOTE — ED Notes (Addendum)
 RN has been standing beside pt for several minutes now, attempting to redirect pt back to laying in bed. Pt becoming ever more aggressive with inability to get up out of bed and walk. Pt aggressively shaking the bed and swearing at staff.

## 2023-10-13 NOTE — ED Notes (Signed)
 Pt pulling at bed railing and screaming at staff. Pt requesting hacksaw.

## 2023-10-13 NOTE — ED Notes (Signed)
 Pt attempting to climb out of bed and yelling "Hey little baby" to his visual hallucinations. Pt thinks their are dogs in his bed. SEE MAR.

## 2023-10-13 NOTE — ED Notes (Addendum)
 Pt expressed desire to sit in chair. RN told pt he can't sit in chair but he can sit up in the bed as if it were a chair. Pt repositioned and sat up in bed for comfort.
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
## 2023-10-13 NOTE — ED Notes (Signed)
 Hospital meal provided, pt tolerated w/o complaints.  Waste discarded appropriately.

## 2023-10-13 NOTE — ED Notes (Signed)
 Charge RN called and asked if there was at [place] present who could help redirect pt from attempting to stand and potentially falling out of bed. Unfortunately there are no sitters available, but pt may be able to be moved to an area with more staff, where the pt can receive more attention when agitated.

## 2023-10-14 MED ORDER — LORAZEPAM 1 MG PO TABS: 1.0000 mg | ORAL_TABLET | Freq: Once | ORAL | Status: DC

## 2023-10-14 NOTE — ED Provider Notes (Signed)
 Emergency Medicine Observation Re-evaluation Note  Hunter [name] is a 89 y.o. male, seen on rounds today.  Pt initially presented to the ED for complaints of TOC Placement  Currently, the patient is resting in bed. No reported issues from nursing team.   Physical Exam  BP (!) 136/49 (BP Location: Left Arm)   Pulse 63   Temp 97.8 F (36.6 C) (Oral)   Resp 16   Ht 6' (1.829 m)   Wt 97.5 kg   SpO2 95%   BMI 29.16 kg/m  Physical Exam General: Resting in bed  ED Course / MDM   No labs last 24 hours.  Plan  Current plan is for dispo per social work.    Trinna Post, MD 10/14/23 [phone]

## 2023-10-14 NOTE — ED Notes (Signed)
 This RN and Ariel, NT3 changed patients linens, gown, brief, and gave warm blanket. Patient resting comfortably.

## 2023-10-14 NOTE — ED Notes (Signed)
 This RN was notified by lab personnal that a valproic acid level was not collected at 0500 today with morning labs. This RN will attempt to draw lab.

## 2023-10-14 NOTE — TOC Progression Note (Signed)
 Transition of Care [facility]) - Progression Note    Patient Details  Name: Hunter [name] MRN: [id number] Date of Birth: 19-Jul-1933  [facility] of [facility]) CM/SW Contact  Colin Broach, LCSW Phone Number: 10/14/2023, 12:24 PM  Clinical Narrative:    CSW made follow-up calls to the following: Earlene Plater:  LVM [place]:  Mailbox full.  Couldn't lv mess [facility]:  LVM  TOC to continue to follow   Expected Discharge Plan:  (TBD) Barriers to Discharge: ED Patient/Family Requesting Placement but Has No Payor  Expected Discharge Plan and Services     Post Acute Care Choice:  (TBD) Living arrangements for the past 2 months: Skilled Nursing Facility                                       Social Determinants of Health (SDOH) Interventions SDOH Screenings   Food Insecurity: No Food Insecurity (06/13/2023)   Received from [facility]  Housing: Low Risk  (06/09/2023)  Transportation Needs: No Transportation Needs (06/13/2023)   Received from [facility]  Utilities: Low Risk  (06/13/2023)   Received from [facility]  Alcohol Screen: Low Risk  (07/06/2021)  Depression (PHQ2-9): Medium Risk (11/26/2022)  Financial Resource Strain: Low Risk  (06/13/2023)   Received from [facility]  Physical Activity: Insufficiently Active (06/13/2023)   Received from [facility]  Social Connections: Moderately Integrated (06/13/2023)   Received from [facility]  Stress: No Stress Concern Present (06/13/2023)   Received from [facility]  Tobacco Use: Low Risk  (08/23/2023)  Recent Concern: Tobacco Use - Medium Risk (06/14/2023)   Received from [facility] Literacy: Medium Risk (06/13/2023)   Received from [facility]    Readmission Risk Interventions     No data to display
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
## 2023-10-14 NOTE — Progress Notes (Signed)
 PT Cancellation Note  Patient Details Name: Hunter [name] MRN: [id number] DOB: 03/29/1934   Cancelled Treatment:    Reason Eval/Treat Not Completed: Medical issues which prohibited therapy. Pt remains heavily sedated and inappropriate for PT intervention as this time. PT to re-attempt at a later time/date as available.    Delphia Grates. Fairly IV, PT, DPT Physical Therapist- [place]  [facility]  10/14/2023, 12:47 PM

## 2023-10-14 NOTE — Progress Notes (Signed)
 OT Cancellation Note  Patient Details Name: Hunter [name] MRN: [id number] DOB: Oct 21, 1933   Cancelled Treatment:    Reason Eval/Treat Not Completed: Fatigue/lethargy limiting ability to participate. Pt sleeping soundly in hallway hospital bed. Does not awaken to name when called, OT will re-attempt when pt is alert enoughto participate.   Daysie Helf L. Yoshino Broccoli, OTR/L  10/14/23, 11:17 AM

## 2023-10-14 NOTE — ED Notes (Signed)
This tech obtained vitals on pt.

## 2023-10-14 NOTE — ED Notes (Signed)
 Pts lunch tray left at bedside.

## 2023-10-15 ENCOUNTER — Encounter: Payer: Self-pay | Admitting: Cardiovascular Disease

## 2023-10-15 ENCOUNTER — Ambulatory Visit: Payer: Medicare Other | Attending: Cardiovascular Disease | Admitting: Cardiovascular Disease

## 2023-10-15 LAB — VALPROIC ACID LEVEL: Valproic Acid Lvl: 78 ug/mL (ref 50.0–100.0)
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
## 2023-10-15 NOTE — ED Provider Notes (Signed)
-----------------------------------------   6:36 AM on 10/15/2023 -----------------------------------------   Blood pressure (!) 109/90, pulse 72, temperature 98.2 F (36.8 C), temperature source Oral, resp. rate 16, height 6' (1.829 m), weight 97.5 kg, SpO2 96%.  The patient is calm and cooperative at this time.  There have been no acute events since the last update.  Awaiting disposition plan from [facility] team.   Janith Lima, MD 10/15/23 [phone]
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
## 2023-10-15 NOTE — ED Provider Notes (Signed)
-----------------------------------------   2:00 PM on 10/15/2023 -----------------------------------------   Blood pressure (!) 111/57, pulse 63, temperature 97.7 F (36.5 C), temperature source Oral, resp. rate 18, height 6' (1.829 m), weight 97.5 kg, SpO2 95%.  The patient is calm and cooperative at this time.  There have been no acute events since the last update.  Awaiting disposition plan from case management/social work.  After discussion with social work team and continues to have aggressive and agitated behavior that is precluding placement.  Recommended another repeat consultation for medication management to psychiatry.  They have been consulted multiple times during this hospitalization, reconsulted for ongoing management    Corena Herter, MD 10/15/23 1400

## 2023-10-15 NOTE — ED Notes (Signed)
 VOL/Pending TOC Placement
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
## 2023-10-16 NOTE — ED Notes (Signed)
 Patient resting at this time with eyes closed. Chest rise and fall noted. RR even and unlabored.

## 2023-10-16 NOTE — Group Note (Deleted)
 Date:  10/16/2023 Time:  9:53 PM  Group Topic/Focus:  Wrap-Up Group:   The focus of this group is to help patients review their daily goal of treatment and discuss progress on daily workbooks.     Participation Level:  {BHH PARTICIPATION WUJWJ:19147}  Participation Quality:  {BHH PARTICIPATION QUALITY:22265}  Affect:  {BHH AFFECT:22266}  Cognitive:  {BHH COGNITIVE:22267}  Insight: {BHH Insight2:20797}  Engagement in Group:  {BHH ENGAGEMENT IN WGNFA:21308}  Modes of Intervention:  {BHH MODES OF INTERVENTION:22269}  Additional Comments:  ***  Maglione,Angell Honse E 10/16/2023, 9:53 PM

## 2023-10-16 NOTE — ED Notes (Signed)
 Pt given fidget felt, placed on lap.

## 2023-10-16 NOTE — ED Notes (Signed)
 This RN checked patient's brief at this time. Brief remains clean and dry at this time. Patient denies any needs.

## 2023-10-16 NOTE — ED Notes (Signed)
 Pt provided with pericare and disposable brief change.  Tolerated well.

## 2023-10-16 NOTE — ED Notes (Signed)
 Pt sitting in bed drinking gingerale at this time, calm and cooperative.

## 2023-10-16 NOTE — ED Notes (Signed)
 Complete linen change was completed after patient was found to have an episode of urinary incontinence. Patient was cleaned up and placed in a clean gown. A new brief was placed on the patient and a new paper pad was placed under the patient. The patient's sheet and blanket was changed. A sacrum foam was applied to the patient. The patient's dressing on the wound on his R upper arm was changed as well. Patient denies other needs at this time.

## 2023-10-16 NOTE — ED Provider Notes (Signed)
-----------------------------------------   4:54 AM on 10/16/2023 -----------------------------------------   Blood pressure (!) 111/57, pulse 63, temperature 97.7 F (36.5 C), temperature source Oral, resp. rate 18, height 6' (1.829 m), weight 97.5 kg, SpO2 95%.  The patient is calm and cooperative at this time.  There have been no acute events since the last update.  Awaiting disposition plan from case management/social work.    Karelyn Brisby, Layla Maw, DO 10/16/23 [phone]

## 2023-10-16 NOTE — ED Notes (Signed)
 Pt noted top be sitting on side of bed displaying verbal aggression and unable to be redirected back into bed.

## 2023-10-17 MED ORDER — LORAZEPAM 2 MG/ML IJ SOLN
1.0000 mg | Freq: Once | INTRAMUSCULAR | Status: AC
  Administered 2023-10-17: 1 mg via INTRAMUSCULAR
  Filled 2023-10-17: qty 1

## 2023-10-17 NOTE — ED Notes (Signed)
 Pt provided with pericare and disposable brief change.  Disoriented, difficult to direct, and verbal agitation displayed with care. Post personal care, pt resting in bed quietly.

## 2023-10-17 NOTE — ED Notes (Signed)
 This tech obtained vital signs on pt.

## 2023-10-17 NOTE — ED Notes (Signed)
Pt brief checked, pt clean and dry at this time.

## 2023-10-17 NOTE — ED Provider Notes (Signed)
 Patient resting comfortably throughout shift.  Had noted to nurse earlier that he had mouth discomfort.  I checked on him he reports that he feels fine now.  He is not sure about having had mouth pain earlier.  Currently awake alert oriented no obvious oral lesions noted.  Appears comfortable reports no further mouth pain or discomfort   Sharyn Creamer, MD 10/17/23 [phone]

## 2023-10-17 NOTE — ED Notes (Signed)
 Pt assisted to w/c by 2 RNS and pushed to bathroom and assisted with 2 RNs to turn and pivot to toilet (had large BM) and 2 RNs to turn and pivot back. Pericare and clean, dry brief provided.  Pt assisted back to bed by 3 RNS, unable to follow directions.

## 2023-10-17 NOTE — ED Notes (Signed)
 Pt attempting to get out of bed, 2 RNS worked with pt to get back in bed.  Pt confused and difficult to orient and direct.

## 2023-10-17 NOTE — ED Provider Notes (Signed)
-----------------------------------------   5:39 PM on 10/17/2023 -----------------------------------------   Blood pressure 103/77, pulse 68, temperature 98.2 F (36.8 C), temperature source Oral, resp. rate 18, height 6' (1.829 m), weight 97.5 kg, SpO2 95%.  The patient is calm and cooperative at this time.  There have been no acute events since the last update.  Awaiting disposition plan from [facility] team.   Janith Lima, MD 10/17/23 1739

## 2023-10-17 NOTE — ED Notes (Signed)
 Pt sitting on side of bed, calm and cooperative at this time.

## 2023-10-17 NOTE — ED Notes (Signed)
Pt brief checked, dry at this time.

## 2023-10-17 NOTE — ED Notes (Signed)
 Pt attempting to exit bed, this RN asked pt if he needed to urinate. Pt states no was wanting to buy a jacket and pants so he could leave and check on his dog. Pt then describing his dog was killed last night, was hit by a car. Pt then laid back in bed. Pt repositioned in bed and covered with blankets.

## 2023-10-17 NOTE — ED Notes (Signed)
 Pt currently resting in bed, NAD noted

## 2023-10-17 NOTE — ED Notes (Signed)
 Pt given snack and beverage.

## 2023-10-17 NOTE — ED Notes (Signed)
 Pt disposable brief checked, dry at this time. Given snack per his request.

## 2023-10-18 ENCOUNTER — Emergency Department

## 2023-10-18 DIAGNOSIS — R918 Other nonspecific abnormal finding of lung field: Secondary | ICD-10-CM | POA: Diagnosis not present

## 2023-10-18 DIAGNOSIS — R0989 Other specified symptoms and signs involving the circulatory and respiratory systems: Secondary | ICD-10-CM | POA: Diagnosis not present

## 2023-10-18 DIAGNOSIS — R059 Cough, unspecified: Secondary | ICD-10-CM | POA: Diagnosis not present

## 2023-10-18 LAB — RESP PANEL BY RT-PCR (RSV, FLU A&B, COVID)  RVPGX2
Influenza A by PCR: NEGATIVE
Influenza B by PCR: NEGATIVE
Resp Syncytial Virus by PCR: NEGATIVE
SARS Coronavirus 2 by RT PCR: NEGATIVE
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
## 2023-10-18 NOTE — ED Notes (Signed)
 This RN and NT to change pt brief

## 2023-10-18 NOTE — TOC Progression Note (Signed)
 Transition of Care [facility]) - Progression Note    Patient Details  Name: Hunter [name] MRN: [id number] Date of Birth: 1933/09/20  [facility] CM/SW Contact  [name], RN Phone Number: 10/18/2023, 1:57 PM  Clinical Narrative:    Bed request sent to [facility] @ [address] [place], [place] [address], P# [phone]. TOC to continue looking for placement.    Expected Discharge Plan:  (TBD) Barriers to Discharge: ED Patient/Family Requesting Placement but Has No Payor  Expected Discharge Plan and Services     Post Acute Care Choice:  (TBD) Living arrangements for the past 2 months: Skilled Nursing Facility                                       Social Determinants of Health (SDOH) Interventions SDOH Screenings   Food Insecurity: No Food Insecurity (06/13/2023)   Received from [facility]  Housing: Low Risk  (06/09/2023)  Transportation Needs: No Transportation Needs (06/13/2023)   Received from [facility]  Utilities: Low Risk  (06/13/2023)   Received from [facility]  Alcohol Screen: Low Risk  (07/06/2021)  Depression (PHQ2-9): Medium Risk (11/26/2022)  Financial Resource Strain: Low Risk  (06/13/2023)   Received from [facility]  Physical Activity: Insufficiently Active (06/13/2023)   Received from [facility]  Social Connections: Moderately Integrated (06/13/2023)   Received from [facility]  Stress: No Stress Concern Present (06/13/2023)   Received from [facility]  Tobacco Use: Low Risk  (08/23/2023)  Recent Concern: Tobacco Use - Medium Risk (06/14/2023)   Received from [facility] Literacy: Medium Risk (06/13/2023)   Received from [facility]    Readmission Risk Interventions     No data to display

## 2023-10-18 NOTE — ED Provider Notes (Signed)
-----------------------------------------   8:18 AM on 10/18/2023 -----------------------------------------   Blood pressure 104/81, pulse 71, temperature 98.2 F (36.8 C), temperature source Oral, resp. rate 18, height 1.829 m (6'), weight 97.5 kg, SpO2 100%.  The patient is calm and cooperative at this time.  There have been no acute events since the last update.  Awaiting disposition plan from [facility] team.   Loleta Rose, MD 10/18/23 [phone]

## 2023-10-18 NOTE — ED Notes (Signed)
 PT at bedside, full linen change and pt brief changed.

## 2023-10-18 NOTE — ED Notes (Signed)
 Pt sitting up and eating lunch
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
## 2023-10-19 NOTE — ED Notes (Signed)
 Pt working with PT

## 2023-10-19 NOTE — ED Notes (Signed)
Pt given crackers and water per his request 

## 2023-10-19 NOTE — ED Notes (Signed)
 Pt had bm, pt changed and cleaned up without diff.
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
## 2023-10-19 NOTE — ED Provider Notes (Signed)
-----------------------------------------   5:49 AM on 10/19/2023 -----------------------------------------   Blood pressure (!) 122/96, pulse 89, temperature 97.7 F (36.5 C), temperature source Oral, resp. rate 18, height 6' (1.829 m), weight 97.5 kg, SpO2 100%.  The patient is calm and cooperative at this time.  There have been no acute events since the last update.  Awaiting disposition plan from Social Work team.   Irean Hong, MD 10/19/23 [phone]

## 2023-10-19 NOTE — TOC Progression Note (Addendum)
 Transition of Care [facility]) - Progression Note    Patient Details  Name: Hunter [name] MRN: [id number] Date of Birth: 1934-05-04  [facility]) CM/SW Contact  Colin Broach, LCSW Phone Number: 10/19/2023, 1:20 PM  Clinical Narrative:  CSW sent clinicals to the following for review:  [facility] (fax: [phone]), [facility] (fax: [phone]) and [facility] (fax: [phone]).  TOC to follow-up with facilities on whether they will accept of not.  1:32pm  CSW followed up with Onnie Graham and spoke with [facility] who states that patient is declined at the facility due to having a Dementia diagnosis.  Expected Discharge Plan:  (TBD) Barriers to Discharge: ED Patient/Family Requesting Placement but Has No Payor  Expected Discharge Plan and Services     Post Acute Care Choice:  (TBD) Living arrangements for the past 2 months: Skilled Nursing Facility                                       Social Determinants of Health (SDOH) Interventions SDOH Screenings   Food Insecurity: No Food Insecurity (06/13/2023)   Received from [facility]  Housing: Low Risk  (06/09/2023)  Transportation Needs: No Transportation Needs (06/13/2023)   Received from [facility]  Utilities: Low Risk  (06/13/2023)   Received from [name]  Alcohol Screen: Low Risk  (07/06/2021)  Depression (PHQ2-9): Medium Risk (11/26/2022)  Financial Resource Strain: Low Risk  (06/13/2023)   Received from [facility]  Physical Activity: Insufficiently Active (06/13/2023)   Received from [facility]  Social Connections: Moderately Integrated (06/13/2023)   Received from [facility]  Stress: No Stress Concern Present (06/13/2023)   Received from [facility]  Tobacco Use: Low Risk  (08/23/2023)  Recent Concern: Tobacco Use - Medium Risk (06/14/2023)   Received from [facility] Literacy: Medium Risk (06/13/2023)   Received from [facility]    Readmission Risk  Interventions     No data to display

## 2023-10-19 NOTE — Progress Notes (Signed)
 OT Cancellation Note  Patient Details Name: Hunter [name] MRN: [id number] DOB: 1933-10-12   Cancelled Treatment:    Reason Eval/Treat Not Completed: Other (comment). OT attempt. Per RN, pt received medication due to agitation at around 10 AM. Requests for OT to hold tx until later this afternoon. OT will check back as able when pt appropriate.    Fusaye Wachtel L. Margret Moat, OTR/L  10/19/23, 12:21 PM

## 2023-10-19 NOTE — ED Notes (Signed)
 Pt currently sleeping will ask once awake

## 2023-10-19 NOTE — ED Notes (Signed)
vol/toc placement.. 

## 2023-10-20 MED ORDER — OLANZAPINE 5 MG PO TABS
5.0000 mg | ORAL_TABLET | Freq: Once | ORAL | Status: AC
  Administered 2023-10-20: 5 mg via ORAL

## 2023-10-20 MED ORDER — OLANZAPINE 5 MG PO TABS: 5.0000 mg | ORAL_TABLET | Freq: Every day | ORAL | Status: DC

## 2023-10-20 NOTE — ED Notes (Signed)
 During rounding, pt stated to this RN, " Hey there Genevie Cheshire, its really good to see you. It looks like you got yourself a nice tan there.... Well Billy, I tell you, it's been hard lately. I haven't been able to get up and walk anywhere. I used to be able to walk anywhere I wanted, but now I can barely stand and it's terrible. It's terrible Billy. But I like your shoes, and you keep on walking. It was so good to see you Billy. Take care."  RN replied, "Thank you Fayrene Fearing. You take care too and if you do try to walk, be careful and ask for help."

## 2023-10-20 NOTE — ED Provider Notes (Signed)
-----------------------------------------   7:38 AM on 10/20/2023 -----------------------------------------   Blood pressure (!) 110/53, pulse 63, temperature 97.8 F (36.6 C), temperature source Oral, resp. rate 19, height 6' (1.829 m), weight 97.5 kg, SpO2 96%.  The patient is calm and cooperative at this time.  There have been no acute events since the last update.  Awaiting disposition plan from [facility] team.   Janith Lima, MD 10/20/23 [phone]

## 2023-10-20 NOTE — ED Notes (Signed)
 Pt moved back from chair to bed after pt finished his cola and attempted to walk after "that lady over there". Pt positioned and centered in bed. Bed exit alarm turned on.

## 2023-10-20 NOTE — ED Notes (Signed)
 Pt redirected back to his bed after attempting to get out of bed. Pt bed alarm reset and pt asked to not get up.

## 2023-10-20 NOTE — TOC Progression Note (Signed)
 Transition of Care [facility] - Progression Note    Patient Details  Name: Hunter [name] MRN: [id number] Date of Birth: [birth year]  Transition of Care [facility]) CM/SW Contact  [name], RN Phone Number: 10/20/2023, 4:21 PM  Clinical Narrative:    [facility]aren Macadam ([facility] group home) to follow up on this referral and was no answer. Left a message requesting a return call.    Expected Discharge Plan:  (TBD) Barriers to Discharge: ED Patient/Family Requesting Placement but Has No Payor  Expected Discharge Plan and Services     Post Acute Care Choice:  (TBD) Living arrangements for the past 2 months: Skilled Nursing Facility                                       Social Determinants of Health (SDOH) Interventions SDOH Screenings   Food Insecurity: No Food Insecurity (06/13/2023)   Received from [facility]  Housing: Low Risk  (06/09/2023)  Transportation Needs: No Transportation Needs (06/13/2023)   Received from [facility]  Utilities: Low Risk  (06/13/2023)   Received from [facility]  Alcohol Screen: Low Risk  (07/06/2021)  Depression (PHQ2-9): Medium Risk (11/26/2022)  Financial Resource Strain: Low Risk  (06/13/2023)   Received from [facility]  Physical Activity: Insufficiently Active (06/13/2023)   Received from [facility]  Social Connections: Moderately Integrated (06/13/2023)   Received from [facility]  Stress: No Stress Concern Present (06/13/2023)   Received from [facility]  Tobacco Use: Low Risk  (08/23/2023)  Recent Concern: Tobacco Use - Medium Risk (06/14/2023)   Received from [facility] Literacy: Medium Risk (06/13/2023)   Received from [facility]    Readmission Risk Interventions     No data to display

## 2023-10-20 NOTE — ED Notes (Signed)
Pt given pm snack

## 2023-10-20 NOTE — ED Notes (Signed)
 Pt provided with ice cream and cola to help take his evening medications. Pt still confused and talking incoherently. Pt redirected back to his bed after attempting to get out of bed. Pt bed alarm reset and pt asked to not get up.

## 2023-10-20 NOTE — ED Notes (Signed)
 ED RN and ED Tech assisted pt in replacing gown and adult brief, which had been soiled by the pt. Pt was wiped clean and dry and pt repositioned in bed for comfort. Pt provided with blanket and bed alarm set.

## 2023-10-20 NOTE — ED Notes (Signed)
 Pt sitting in bed, methodically folding and unfolding the blanket and talking to no one in particular. Pt is increasing getting louder and occasionally more agitated.

## 2023-10-20 NOTE — ED Notes (Signed)
 Pt transitioned from bed to chair after RN saw that pt was sliding towards the bottom of bed. RN setup bed exit alarm, locked wheels to chair, and placed small table in front of pt with cola beverage. Pt ABCs intact. RR even and unlabored. Pt in NAD. Denies further needs at this time.    Past Medical History:  Diagnosis Date   Allergic rhinitis 02/02/2007   Anemia    Arthritis    "knees" (02/08/2017)   Chronic lower back pain    CKD (chronic kidney disease), stage III (HCC) 05/27/2011   Complete heart block 02/08/2017   Eczema    Essential hypertension 02/02/2007   Lasix 60 mg, losartan 50mg ,  Amlodipine 5mg --> 2.5 mg.  Usually have to repeat BP measurements  In the past-Maxzide 37.5-25mg .   GERD (gastroesophageal reflux disease) occasional   Hearing loss of both ears    Heart failure with preserved ejection fraction (HFpEF) 12/06/2013   History of skin cancer 03/26/2014   nose    Hx of echocardiogram    Echo (07/2013): Mild LVH, EF 55-60%, grade 1 diastolic dysfunction, mild LAE, PASP 23   Hyperlipidemia    Hypertension    Hypothyroidism    LBBB (left bundle branch block)    Left patella fracture 2018   "no OR" (02/08/2017)   Major depression in partial remission 02/02/2007   Amitriptyline 25mg  for sleep (stop when runs out of #10 04/2018(, zoloft 100-->150mg --> 200mg    Mild neurocognitive disorder 06/21/2019   Neuropathy (HCC) 11/02/2011   Nocturia    Peripheral vascular disease (HCC) LOWER EXTREMITIES   Spinal stenosis in cervical region 03/21/2010   Squamous cell skin cancer, penis: glans (HCC) 05/2010   Initial excision 11/11; recurrence, excision and laser Rx 9/13   Thrombocytopenia (HCC) 05/23/2012   Urinary frequency 09/05/2009   Possible BPH- see Artist Pais notes    Vitamin D deficiency 08/15/2008

## 2023-10-20 NOTE — ED Notes (Signed)
 Pt trying to get out of the bed and is speaking to no one in particular. This RN tried to de-escalate and redirect which has worked at this time.

## 2023-10-20 NOTE — ED Notes (Signed)
 Pt's brief changed d/t pt tearing one side of it because he said it was too tight. Pt in position of comfort, with bed in lowest position, call bell in reach & bed exit set to alarm.

## 2023-10-21 NOTE — ED Notes (Signed)
 RN saw that pt was attempting to get up again. Pt repositioned in bed, tucked under covers, and all lights in room turned off to promote environment of rest for the pt.

## 2023-10-21 NOTE — ED Notes (Signed)
 Pt resting in bed with the occasional attempt to get out of bed. Pt ABCs intact. RR even and unlabored. Pt in NAD. Bed in lowest locked position and bed alarm on.

## 2023-10-21 NOTE — TOC Progression Note (Signed)
 Transition of Care [facility]) - Progression Note    Patient Details  Name: Hunter [name] MRN: [id number] Date of Birth: 04/04/1934  Transition of Care [facility]) CM/SW Contact  Colin Broach, LCSW Phone Number: 10/21/2023, 12:29 PM  Clinical Narrative:    CSW followed up with the following facilities to see if a determination had been made after clinicals were faxed om 10/19/2023:  [facility] (ph: [phone])  They don't have male beds available. [facility] (ph: [phone]).  They can't take patients with stage 3 Kidney Disease. [facility] (ph: [phone]).  [facility] Lund stated that they didn't receive the fax on 4/8.  CSW refaxed information to fax #: [phone].   TOC to continue looking for facilities for patient.  Expected Discharge Plan:  (TBD) Barriers to Discharge: ED Patient/Family Requesting Placement but Has No Payor  Expected Discharge Plan and Services     Post Acute Care Choice:  (TBD) Living arrangements for the past 2 months: Skilled Nursing Facility                                       Social Determinants of Health (SDOH) Interventions SDOH Screenings   Food Insecurity: No Food Insecurity (06/13/2023)   Received from [facility]  Housing: Low Risk  (06/09/2023)  Transportation Needs: No Transportation Needs (06/13/2023)   Received from [facility]  Utilities: Low Risk  (06/13/2023)   Received from [facility]  Alcohol Screen: Low Risk  (07/06/2021)  Depression (PHQ2-9): Medium Risk (11/26/2022)  Financial Resource Strain: Low Risk  (06/13/2023)   Received from [facility]  Physical Activity: Insufficiently Active (06/13/2023)   Received from [facility]  Social Connections: Moderately Integrated (06/13/2023)   Received from [facility]  Stress: No Stress Concern Present (06/13/2023)   Received from [facility]  Tobacco Use: Low Risk  (08/23/2023)  Recent Concern: Tobacco Use - Medium Risk (06/14/2023)   Received from [facility] Literacy: Medium Risk (06/13/2023)   Received from [facility]    Readmission Risk Interventions     No data to display

## 2023-10-21 NOTE — ED Provider Notes (Signed)
 Emergency Medicine Observation Re-evaluation Note  Hunter [name] is a 89 y.o. male who is awaiting TOC placement  Physical Exam  BP 123/69   Pulse 61   Temp 97.9 F (36.6 C) (Oral)   Resp 18   Ht 6' (1.829 m)   Wt 97.5 kg   SpO2 97%   BMI 29.16 kg/m  Physical Exam General: resting comfortably   ED Course / MDM  No new labs in past 24 hours  Plan  Current plan is for TOC placement.    Phineas Semen, MD 10/21/23 2132

## 2023-10-21 NOTE — Progress Notes (Signed)
 OT Cancellation Note  Patient Details Name: Hunter [name] MRN: [id number] DOB: 1933-09-09   Cancelled Treatment:    Reason Eval/Treat Not Completed: Fatigue/lethargy limiting ability to participate. Pt asleep in bed, OT will coordinate efforts and see pt after lunch or later date as able.   Zandria Woldt L. Marlan Steward, OTR/L  10/21/23, 11:56 AM

## 2023-10-21 NOTE — ED Notes (Signed)
 Pt attempting to crawl out of bed stating he sees a baby's head popping up at the end of the bed, pt stating he is looking for flat pieces of fabric with seams in it, this RN assisted pt back into bed and offered drink

## 2023-10-21 NOTE — ED Notes (Signed)
 Patient resting comfortably in bed. Chest with equal rise and fall with respirations.

## 2023-10-21 NOTE — ED Notes (Signed)
 Pt becoming more and more agitated and restless. Staff has continuously repositioned pt back into bed, at least once every 10-15 minutes. Pt does not like being put back into bed and is now grabbing at staff.

## 2023-10-21 NOTE — ED Notes (Signed)
 PT resting at this time with both eyes closed. Pt Pt ABCs intact. RR even and unlabored. Pt in NAD. Bed in lowest locked position.
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
## 2023-10-21 NOTE — ED Notes (Signed)
 Pt restless and still continuing to shimmy to edge of bed to get up. Pt still able to be redirected but is showing some sign of irritation. RN offered pt beverage to distract.  Otherwise, pt ABCs intact. RR even and unlabored. Bed in lowest locked position with alarm set. Call bell in reach. Denies needs at this time.

## 2023-10-21 NOTE — ED Notes (Signed)
 Pt resting comfortably in bed, Coke provided per pt request, brief dry

## 2023-10-21 NOTE — ED Notes (Addendum)
 Pt attempting to crawl out of bed again stating he is looking for the blue bird in the cage, pt assisted back up in bed, pt continues to pull at side rail, unable to redirect pt attention, tv turned onto nature channel in diversion attempt

## 2023-10-22 MED ORDER — LORAZEPAM 1 MG PO TABS
1.0000 mg | ORAL_TABLET | Freq: Once | ORAL | Status: AC
  Administered 2023-10-22: 1 mg via ORAL
  Filled 2023-10-22: qty 1

## 2023-10-22 NOTE — ED Notes (Addendum)
 Occupational therapy and mobility specialist in room with patient at the moment

## 2023-10-22 NOTE — ED Notes (Signed)
 Pt sitting in chair with bed alarm on, grip socks, and fall risk bracelet on.

## 2023-10-22 NOTE — ED Notes (Signed)
 Pericare provided. Dry brief applied.
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
## 2023-10-22 NOTE — ED Notes (Addendum)
 Pt assisted with moving back into bed. Bed alarm, grip socks, and fall bracelet are on.

## 2023-10-22 NOTE — ED Provider Notes (Signed)
-----------------------------------------   8:10 AM on 10/22/2023 -----------------------------------------   Blood pressure 123/69, pulse 61, temperature 97.9 F (36.6 C), temperature source Oral, resp. rate 18, height 6' (1.829 m), weight 97.5 kg, SpO2 97%.  The patient is calm and cooperative at this time.  There have been no acute events since the last update.  Awaiting disposition plan from case management/social work.    Corena Herter, MD 10/22/23 [phone]

## 2023-10-22 NOTE — ED Notes (Signed)
 Pt resting comfortably at this time, equal rise and fall of chest noted

## 2023-10-22 NOTE — ED Notes (Signed)
 Pt attempting to get out of bed, repositioned

## 2023-10-22 NOTE — ED Notes (Signed)
 Dinner tray provided

## 2023-10-22 NOTE — ED Notes (Signed)
 Pt given a bath using cleansing wipes. New brief and bed pads applied.

## 2023-10-23 NOTE — ED Provider Notes (Signed)
 BP 102/81 (BP Location: Right Arm)   Pulse 64   Temp (!) 97.4 F (36.3 C) (Oral)   Resp 12   Ht 6' (1.829 m)   Wt 97.5 kg   SpO2 95%   BMI 29.16 kg/m    The patient is calm and cooperative at this time. There have been no acute events since the last update. Awaiting disposition plan from case management/social work.    Collis Deaner, MD 10/23/23 [phone]

## 2023-10-23 NOTE — ED Notes (Signed)
 Assisted pt to bed, repositioned pt in bed pt lying on his right side, no other needs  present

## 2023-10-23 NOTE — ED Notes (Signed)
 Pt's brief changed and pt pulled up in bed by this RN and ED tech, Ariel.

## 2023-10-23 NOTE — ED Notes (Signed)
 Pt incontinent of urine, pt cleaned, bed linen changed, new chuck pad placed, new brief placed, new gown given. Warm blankets given

## 2023-10-23 NOTE — ED Notes (Signed)
 Provided pt with dinner tray, helped set up pt's dinner on table no other needs at present.

## 2023-10-23 NOTE — ED Notes (Signed)
 Again pt found with feet hanging over edge of bed, pt encouraged to stay in bed and get some sleep.

## 2023-10-23 NOTE — ED Notes (Signed)
 Assisted pt to recliner. Pt is weak. Able only to pivot from bed to recliner

## 2023-10-23 NOTE — ED Notes (Signed)
 Pt incontinent of urine, changed incontinent pad, performed perineal care, applied barrier cream to scrotum. No other needs at present

## 2023-10-23 NOTE — ED Notes (Signed)
 Pt feet hanging of edge of bed. Pt stating trying to get upstairs to tuck his cat in bed. Pt reminded we are in hospital and need to stay in bed to try and get some sleep.

## 2023-10-23 NOTE — ED Notes (Signed)
 Pt incontinent of urine, changed incontinent pad clead perineal area, applied barrier cream. Repositioned pt in bed

## 2023-10-23 NOTE — ED Notes (Signed)
Pt sleeping. Will continue to monitor

## 2023-10-23 NOTE — ED Notes (Signed)
 Pt sleeping on his left side. Will continue to monitor

## 2023-10-24 NOTE — ED Notes (Signed)
 Provided pt with dinner tray, helped set up pt's dinner on table no other needs at present.

## 2023-10-24 NOTE — ED Notes (Signed)
 Pt sat up in bed and repositioned to eat lunch by this RN and Theatre manager. Pt provided with plasticware, tray table set up for pt to eat.

## 2023-10-24 NOTE — ED Notes (Signed)
 Pt sat up in bed, repositioned to eat breakfast by this RN and Theatre manager. Pt given plasticware to eat with.

## 2023-10-24 NOTE — ED Notes (Signed)
 Received handoff from Zach, [place]

## 2023-10-24 NOTE — ED Notes (Signed)
 Pt incontinent of urine. This Student RN and Gladys Lamp, RN performed peri care and changed pt into clean gown. Clean sheets, and blankets provided. Pt boosted up in bed with help of Nurse preceptor Gladys Lamp, RN

## 2023-10-24 NOTE — ED Notes (Signed)
 Pt attempting to crawl out of bed, RN redirected pt informed him he is in the hospital. Repositioned pt in bed. Covered pt with blankets. Pt calm.

## 2023-10-24 NOTE — ED Notes (Signed)
 Vol toc placement

## 2023-10-24 NOTE — ED Notes (Signed)
 RN and NT changed patient's brief. Large void. Bed pad changed, brief changed, Patient gown changed, pillow cases changed, warm blankets provided.

## 2023-10-24 NOTE — ED Provider Notes (Signed)
-----------------------------------------   3:40 AM on 10/24/2023 -----------------------------------------   Blood pressure 102/81, pulse 64, temperature (!) 97.4 F (36.3 C), temperature source Oral, resp. rate 12, height 1.829 m (6'), weight 97.5 kg, SpO2 95%.  The patient is calm and cooperative at this time.  There have been no acute events since the last update.  Awaiting disposition plan from [facility] team.   Lynnda Sas, MD 10/24/23 [phone]

## 2023-10-24 NOTE — ED Notes (Signed)
 Pt given Coke to drink. Pt repositioned in bed.

## 2023-10-24 NOTE — ED Notes (Signed)
Patient situated and pulled up in bed

## 2023-10-24 NOTE — ED Notes (Signed)
 Pt attempting to crawl out of bed. Pt upset repeating he needs to go "over there" and seeing a "blue little bird" RN redirected pt informed him he is in the hospital, pt replied he wants to eat, RN provided pt with some graham crackers and shasta lime. Repositioned pt in bed and set up table with drink and crackers. Pt calmed down and eating, will continue to monitor

## 2023-10-24 NOTE — ED Notes (Addendum)
 Pt attempting to get out of bed several times, pt legs hanging off end of bed, pt redirected back into bed and repositioned by this RN and Theatre manager. Pt not able to stand on own needing 2 person assist to hold pt up. Pt given drink and warm blankets.

## 2023-10-25 NOTE — Progress Notes (Addendum)
 Physical Therapy Treatment Patient Details Name: Hunter [name] MRN: [id number] DOB: 09-Dec-1933 Today's Date: 10/25/2023   History of Present Illness Pt is an 89 y/o M admitted on 08/24/23 under IVC for aggression with staff & behavioral challenges (per psych, due to baseline dementia) at his facility. PMH: chronic LBP, CKD 3, essential HTN, HLD, L BBB, major depression, neuropathy, PVD.    PT Comments  Patient resting in bed upon arrival to session, working to open mild carton. Assisted as needed and set up to drink per patient request. Patient pleasant and cooperative throughout session; agreeable for and eager for OOB activities, ultimately voicing need for toileting.  Does need near-constant hand-over-hand cuing to initiate, sequence and complete all functional activities, as patient with marked difficulty sustaining attention to any task given baseline dementia.  Very limited ability to carry-over new information/learning. Continues to require +2 min/mod assist for all transfers (from all seating surfaces) and gait efforts (25') with RW.  Demonstrates very short, shuffling steps with narrowed BOS; poor postural extension (significant flexion of trunk, hips and knees).  Manual faciltiation for walker placement and management; close chair follow for safety. Very easily distracted by external environment once out in hallway  Given noted fluctuations in abilities since admission, remains appropriate for skilled PT/OT intervention to maintain and progress functional indep.  Goals from initial evaluation remain appropriate; POC/date for goals extended to reflect continued services.  Discharge recommendations remain appropriate, as patient continues to require +1-2 assist for all ADLs/mobility needs.  May be appropriate for transition to LTC/memory care post rehab for longer-term care needs.     If plan is discharge home, recommend the following: Assistance with cooking/housework;Direct  supervision/assist for medications management;Assist for transportation;Help with stairs or ramp for entrance;Supervision due to cognitive status;A lot of help with walking and/or transfers;A lot of help with bathing/dressing/bathroom   Can travel by private vehicle     No  Equipment Recommendations       Recommendations for Other Services       Precautions / Restrictions Precautions Precautions: Fall Recall of Precautions/Restrictions: Impaired Precaution/Restrictions Comments: hx of restlessness, agitation and aggression Restrictions Weight Bearing Restrictions Per Provider Order: No     Mobility  Bed Mobility Overal bed mobility: Needs Assistance Bed Mobility: Supine to Sit     Supine to sit: HOB elevated, Used rails, Min assist     General bed mobility comments: once upright, maintains static sitting balance with close sup; dynamic with min assist for correction of posterior lean    Transfers Overall transfer level: Needs assistance Equipment used: Rolling walker (2 wheels) Transfers: Sit to/from Stand Sit to Stand: Mod assist, Min assist, +2 physical assistance, +2 safety/equipment Stand pivot transfers: Mod assist, +2 safety/equipment         General transfer comment: sit/stand from various surface heights-edge of bed, standard toilet, recliner--min/mod assist +2 for safety from all surfaces.  Difficulty with anterior weight translation, lift off and postural extension (tends to maintain flexed posture at trunk, hips and knees)    Ambulation/Gait Ambulation/Gait assistance: Min assist, Mod assist, +2 physical assistance, +2 safety/equipment Gait Distance (Feet): 25 Feet Assistive device: Rolling walker (2 wheels)         General Gait Details: very short, shuffling steps with narrowed BOS; poor postural extension (significant flexion of trunk, hips and knees).  Manual faciltiation for walker placement and management; close chair follow for safety. Very easily  distracted by external environment once out in hallway   Stairs  Wheelchair Mobility     Tilt Bed    Modified Rankin (Stroke Patients Only)       Balance Overall balance assessment: Needs assistance Sitting-balance support: No upper extremity supported, Feet supported Sitting balance-Leahy Scale: Fair   Postural control: Posterior lean Standing balance support: Bilateral upper extremity supported, During functional activity, Reliant on assistive device for balance Standing balance-Leahy Scale: Poor Standing balance comment: pt remains high fall risk, required max multimodal cuing to extend knees fully                            Communication Communication Communication: Impaired Factors Affecting Communication: Hearing impaired  Cognition Arousal: Alert Behavior During Therapy: WFL for tasks assessed/performed   PT - Cognitive impairments: History of cognitive impairments, Safety/Judgement, Problem solving, Sequencing, Initiation, Awareness, Orientation   Orientation impairments: Situation, Time, Place                   PT - Cognition Comments: Patient calm and cooperative; eager/agreeable to OOB activities.  Responds to some simple, verbal cues but often requires hand-over-hand to initiate all tasks due to known dementia Following commands: Impaired Following commands impaired: Follows one step commands with increased time    Cueing Cueing Techniques: Verbal cues, Tactile cues, Gestural cues  Exercises Other Exercises Other Exercises: SPT bed/chair, chair/standard toilet, mod asisst +2.  Hand-over-hand throughout to comprehend, initiate and motor plan task.  Max/dep assist for hygiene, upper/lower body bathing routine and gown change (see OT note for additional details)    General Comments        Pertinent Vitals/Pain Pain Assessment Pain Assessment: No/denies pain    Home Living                          Prior  Function            PT Goals (current goals can now be found in the care plan section) Acute Rehab PT Goals Patient Stated Goal: none stated PT Goal Formulation: With patient Time For Goal Achievement: 11/24/23 Potential to Achieve Goals: Fair Progress towards PT goals: Progressing toward goals    Frequency    Min 1X/week      PT Plan      Co-evaluation     PT goals addressed during session: Mobility/safety with mobility;Balance;Proper use of DME;Strengthening/ROM OT goals addressed during session: ADL's and self-care      AM-PAC PT "6 Clicks" Mobility   Outcome Measure  Help needed turning from your back to your side while in a flat bed without using bedrails?: None Help needed moving from lying on your back to sitting on the side of a flat bed without using bedrails?: A Little Help needed moving to and from a bed to a chair (including a wheelchair)?: A Little Help needed standing up from a chair using your arms (e.g., wheelchair or bedside chair)?: A Lot Help needed to walk in hospital room?: A Lot Help needed climbing 3-5 steps with a railing? : A Lot 6 Click Score: 16    End of Session Equipment Utilized During Treatment: Gait belt Activity Tolerance: Patient tolerated treatment well Patient left: in chair;with call bell/phone within reach;with chair alarm set Nurse Communication: Mobility status PT Visit Diagnosis: Unsteadiness on feet (R26.81);Muscle weakness (generalized) (M62.81);Other abnormalities of gait and mobility (R26.89);Difficulty in walking, not elsewhere classified (R26.2);History of falling (Z91.81)     Time: 1308-6578 PT Time Calculation (  min) (ACUTE ONLY): 37 min  Charges:    $Gait Training: 8-22 mins PT General Charges $$ ACUTE PT VISIT: 1 Visit                     Aiyla Baucom H. Bevin Bucks, PT, DPT, NCS 10/25/23, 3:46 PM [phone]

## 2023-10-25 NOTE — ED Notes (Signed)
 PT at bedside.

## 2023-10-25 NOTE — TOC Progression Note (Addendum)
 Transition of Care [facility]) - Progression Note    Patient Details  Name: Hunter [name] MRN: [id number] Date of Birth: 10/31/1933  Transition of [facility] CM/SW Contact  Elmira Haddock, LCSW Phone Number: 10/25/2023, 12:04 PM  Clinical Narrative:   CSW followed up with phone call to [facility].  Left message for Leary Provencal.  In addition, CSW reached out to PT/OT to ask them about patient behaviors when they provide tx.  CSW explained that we are working diligently to get patient placed at a facility and, if patient isn't displaying aggressive behaviors, to please consider changing the wording in the notes.  This could potentially help get him placed at a facility.  Waiting on updated PT/OT notes to fax out to more facilities.  TOC to continue to follow.  4:52pm  Clinicals faxed to the following:  [facility] and [place].  TOC to follow-up with phone call to confirm receipt.    Expected Discharge Plan:  (TBD) Barriers to Discharge: ED Patient/Family Requesting Placement but Has No Payor  Expected Discharge Plan and Services     Post Acute Care Choice:  (TBD) Living arrangements for the past 2 months: Skilled Nursing Facility                                       Social Determinants of Health (SDOH) Interventions SDOH Screenings   Food Insecurity: No Food Insecurity (06/13/2023)   Received from [facility]  Housing: Low Risk  (06/09/2023)  Transportation Needs: No Transportation Needs (06/13/2023)   Received from [facility]  Utilities: Low Risk  (06/13/2023)   Received from [facility]  Alcohol Screen: Low Risk  (07/06/2021)  Depression (PHQ2-9): Medium Risk (11/26/2022)  Financial Resource Strain: Low Risk  (06/13/2023)   Received from [facility]  Physical Activity: Insufficiently Active (06/13/2023)   Received from [facility]  Social Connections: Moderately Integrated (06/13/2023)   Received from [facility]  Stress: No Stress  Concern Present (06/13/2023)   Received from [facility]  Tobacco Use: Low Risk  (08/23/2023)  Recent Concern: Tobacco Use - Medium Risk (06/14/2023)   Received from [facility] Literacy: Medium Risk (06/13/2023)   Received from [facility]    Readmission Risk Interventions     No data to display

## 2023-10-25 NOTE — ED Notes (Signed)
 Pt transferred back to bed from chair and is resting comfortably.

## 2023-10-25 NOTE — Progress Notes (Signed)
 Occupational Therapy Treatment Patient Details Name: Hunter [name] MRN: [id number] DOB: March 08, 1934 Today's Date: 10/25/2023   History of present illness Pt is an 89 y/o M admitted on 08/24/23 under IVC for aggression with staff & behavioral challenges (per psych, due to baseline dementia) at his facility. PMH: chronic LBP, CKD 3, essential HTN, HLD, L BBB, major depression, neuropathy, PVD.   OT comments  Pt seen for OT/PT co-tx session this date, received working with PT. Pt pleasant and cooperative throughout session. Ready for OOB activities, requesting to use toilet. Pt requires MIN - MOD A +2 for functional transfers from various surface levels (bed, recliner, standard toilet) and continues to demo poor upright posture with flexed trunk, hips and knees. Performs UB/LB bathing routine with max multimodal cuing, benefits from simple, 1-step cues with tactile support for sequencing of functional tasks due to baseline dementia. Requires MAX A to don clean brief and pull over hips in standing, pt frequently externally and internally distracted. Hand-over-hand assist to perform UB bathing and donning clean gown, pt demos poor sustained attention, motor planning and sequencing. Able to perform functional mobility short distance with RW and chair follow for safety, requires manual facilitation of RW and demos short, shuffling steps throughout. See flowsheet below for additional ADL details.   Pt continues to be appropriate for skilled OT/PT treatment due to fluctuations in abilities. OT will continue to follow to progress towards functional gains. Discharge recommendation appropriate. Pt continues to require +1-2 assist for all ADL and mobility needs. May be appropriate for transition to LTC/memory care post-rehab for long term care needs given cognitive deficits.       If plan is discharge home, recommend the following:  Supervision due to cognitive status;Assist for transportation;Assistance with  cooking/housework;Help with stairs or ramp for entrance;Two people to help with walking and/or transfers;Direct supervision/assist for financial management;Direct supervision/assist for medications management;Two people to help with bathing/dressing/bathroom   Equipment Recommendations  Other (comment)       Precautions / Restrictions Precautions Precautions: Fall Recall of Precautions/Restrictions: Impaired Precaution/Restrictions Comments: hx of restlessness, agitation and aggression Restrictions Weight Bearing Restrictions Per Provider Order: No       Mobility Bed Mobility Overal bed mobility: Needs Assistance Bed Mobility: Supine to Sit     Supine to sit: HOB elevated, Used rails, Min assist     General bed mobility comments: close supervision for sitting balance, posterior lean while donning socks    Transfers Overall transfer level: Needs assistance Equipment used: Rolling walker (2 wheels) Transfers: Sit to/from Stand Sit to Stand: Mod assist, Min assist, +2 physical assistance, +2 safety/equipment Stand pivot transfers: Mod assist, +2 safety/equipment   Step pivot transfers: Mod assist, +2 physical assistance, +2 safety/equipment     General transfer comment: difficulties with weight translation to full upright standing, requires cues to extend bilat knees and trunk     Balance Overall balance assessment: Needs assistance Sitting-balance support: No upper extremity supported, Feet supported Sitting balance-Leahy Scale: Fair   Postural control: Posterior lean Standing balance support: Bilateral upper extremity supported, During functional activity, Reliant on assistive device for balance Standing balance-Leahy Scale: Poor Standing balance comment: pt remains high fall risk, required max multimodal cuing to extend knees fully                           ADL either performed or assessed with clinical judgement   ADL Overall ADL's : Needs  assistance/impaired  Grooming: Wash/dry hands;Brushing hair;Minimal assistance Grooming Details (indicate cue type and reason): seated in recliner Upper Body Bathing: Maximal assistance;Sitting Upper Body Bathing Details (indicate cue type and reason): pt able to wash chest, requires frequent cues and directional cuine Lower Body Bathing: Maximal assistance;Sit to/from stand   Upper Body Dressing : Maximal assistance;Sitting Upper Body Dressing Details (indicate cue type and reason): hand over hand to comprehend, and motor plan task performance Lower Body Dressing: Maximal assistance;Sit to/from stand Lower Body Dressing Details (indicate cue type and reason): pt able to hold feet up for brief to be donned, does not demo balance to pull brief over hips once standing and requires assist. maxA for donning socks with posterior lean Toilet Transfer: Moderate assistance;Minimal assistance;+2 for physical assistance   Toileting- Clothing Manipulation and Hygiene: Maximal assistance;+2 for physical assistance;Sit to/from stand Toileting - Clothing Manipulation Details (indicate cue type and reason): pt able to voice need for toileting, once on toilet does not urinate as pt is easily internally/externally distracted     Functional mobility during ADLs: +2 for physical assistance;Moderate assistance;Rolling walker (2 wheels);Cueing for sequencing;Cueing for safety       Communication Communication Communication: Impaired Factors Affecting Communication: Hearing impaired (hears better with L vs R ear)   Cognition Arousal: Alert Behavior During Therapy: WFL for tasks assessed/performed Cognition: History of cognitive impairments, Cognition impaired   Orientation impairments: Place, Time, Situation Awareness: Intellectual awareness impaired Memory impairment (select all impairments): Short-term memory, Working [employer] fast streamer, Non-declarative long-term memory, Geneticist, molecular long-term memory Attention  impairment (select first level of impairment): Focused attention Executive functioning impairment (select all impairments): Initiation, Organization, Sequencing, Reasoning, Problem solving OT - Cognition Comments: pleasant and cooperative                 Following commands: Impaired Following commands impaired: Follows one step commands with increased time      Cueing   Cueing Techniques: Verbal cues, Tactile cues, Gestural cues             Pertinent Vitals/ Pain       Pain Assessment Pain Assessment: Faces Faces Pain Scale: Hurts a little bit Pain Location: BLE heels Pain Descriptors / Indicators: Discomfort Pain Intervention(s): Limited activity within patient's tolerance         Frequency  Min 2X/week        Progress Toward Goals  OT Goals(current goals can now be found in the care plan section)  Progress towards OT goals: Progressing toward goals  Acute Rehab OT Goals OT Goal Formulation: Patient unable to participate in goal setting Time For Goal Achievement: 11/01/23 Potential to Achieve Goals: Fair ADL Goals Pt Will Perform Grooming: with supervision;standing;sitting Pt Will Perform Upper Body Bathing: sitting;with set-up Pt Will Perform Lower Body Bathing: with min assist;sit to/from stand Pt Will Perform Upper Body Dressing: with set-up;sitting Pt Will Perform Lower Body Dressing: with contact guard assist;sitting/lateral leans;sit to/from stand Pt Will Transfer to Toilet: with contact guard assist;bedside commode;ambulating;grab bars Pt Will Perform Toileting - Clothing Manipulation and hygiene: with contact guard assist;sit to/from stand;sitting/lateral leans  Plan      Co-evaluation    PT/OT/SLP Co-Evaluation/Treatment: Yes Reason for Co-Treatment: For patient/therapist safety;Necessary to address cognition/behavior during functional activity PT goals addressed during session: Mobility/safety with mobility;Balance;Proper use of  DME;Strengthening/ROM OT goals addressed during session: ADL's and self-care      AM-PAC OT "6 Clicks" Daily Activity     Outcome Measure   Help from another person eating meals?: None Help from  another person taking care of personal grooming?: A Little Help from another person toileting, which includes using toliet, bedpan, or urinal?: A Lot Help from another person bathing (including washing, rinsing, drying)?: A Lot Help from another person to put on and taking off regular upper body clothing?: A Little Help from another person to put on and taking off regular lower body clothing?: A Lot 6 Click Score: 16    End of Session Equipment Utilized During Treatment: Rolling walker (2 wheels)  OT Visit Diagnosis: Unsteadiness on feet (R26.81);Repeated falls (R29.6);Muscle weakness (generalized) (M62.81)   Activity Tolerance Patient tolerated treatment well   Patient Left in chair;with chair alarm set;with call bell/phone within reach   Nurse Communication Mobility status        Time: 7829-5621 OT Time Calculation (min): 34 min  Charges: OT General Charges $OT Visit: 1 Visit OT Treatments $Self Care/Home Management : 8-22 mins  Adreanna Fickel L. Madisson Kulaga, OTR/L  10/25/23, 4:54 PM

## 2023-10-26 ENCOUNTER — Emergency Department

## 2023-10-26 DIAGNOSIS — M85871 Other specified disorders of bone density and structure, right ankle and foot: Secondary | ICD-10-CM | POA: Diagnosis not present

## 2023-10-26 DIAGNOSIS — M7989 Other specified soft tissue disorders: Secondary | ICD-10-CM | POA: Diagnosis not present

## 2023-10-26 DIAGNOSIS — M19071 Primary osteoarthritis, right ankle and foot: Secondary | ICD-10-CM | POA: Diagnosis not present

## 2023-10-26 NOTE — ED Provider Notes (Signed)
-----------------------------------------   6:10 AM on 10/26/2023 -----------------------------------------   Blood pressure 105/60, pulse 70, temperature (!) 97.5 F (36.4 C), temperature source Temporal, resp. rate 17, height 6' (1.829 m), weight 97.5 kg, SpO2 100%.  The patient is calm and cooperative at this time.  There have been no acute events since the last update.  Awaiting disposition plan from Social Work team.   Lily Velasquez J, MD 10/26/23 [phone]

## 2023-10-26 NOTE — ED Notes (Addendum)
 This RN and EDT Joselyn Nicely found pt to be incontinent of urine. Staff got pt cleaned up, pericare was performed, a mepliex was put on the pt's bottom which was red and blanchable and a new chuck pad and brief were applied. Skin barrier cream was also applied to pt's scrotum due to redness. Pt is clean and dry at this time.

## 2023-10-26 NOTE — ED Notes (Signed)
 Pt complained of a sore spot on his right big toe. Upon assessment, there was a raised spot that was pink in color toward the left outer side of the pt's right big toe. MD Vicenta Graft made aware and will assess today.

## 2023-10-27 NOTE — ED Notes (Signed)
 Patient was found by the RN and Tech trying to get out of bed after the bed alarm went off. Patient was placed back in bed at this time. Bed alarm resumed at this time. Warm blankets placed on patient.

## 2023-10-27 NOTE — ED Notes (Signed)
 Pt resting, eyes closed, respirations even non-labored.

## 2023-10-27 NOTE — ED Notes (Signed)
 RN provided pericare to pt and changed brief and pads. Pt repositioned in bed for comfort.

## 2023-10-27 NOTE — ED Notes (Addendum)
 Pt incontinent to bladder. Brief changed. New gown and sheets applied. Redness to pt sacrum, mepilex dressing applied.

## 2023-10-27 NOTE — ED Provider Notes (Signed)
 Emergency Medicine Observation Re-evaluation Note  Hunter [name] is a 89 y.o. male, seen on rounds today.  Pt initially presented to the ED for complaints of TOC Placement  Currently, the patient is calm, no acute complaints.  Physical Exam  Blood pressure 104/72, pulse 64, temperature (!) 97.2 F (36.2 C), temperature source Axillary, resp. rate 18, height 6' (1.829 m), weight 97.5 kg, SpO2 100%. Physical Exam General: NAD Lungs: CTAB Psych: not agitated  ED Course / MDM  EKG:    I have reviewed the labs performed to date as well as medications administered while in observation.  Recent changes in the last 24 hours include no acute events overnight.    Plan  Current plan is for TOC placement.   Jacquie Maudlin, MD 10/27/23 [phone]

## 2023-10-27 NOTE — Progress Notes (Signed)
 Physical Therapy Treatment Patient Details Name: Hunter [name] MRN: [id number] DOB: [birth year] Today's Date: 10/27/2023   History of Present Illness Pt is an 89 y/o M admitted on 08/24/23 under IVC for aggression with staff & behavioral challenges (per psych, due to baseline dementia) at his facility. PMH: chronic LBP, CKD 3, essential HTN, HLD, L BBB, major depression, neuropathy, PVD.    PT Comments  Increased confusion, restlessness this session (?time of day?), requiring constant cuing for redirection to isolated tasks; however, remains pleasant and cooperative within abilities.  Speech very fluent, but tangential; generally unaware of surroundings and situation.  Requiring increased assist for sitting balance and dynamic reaching activities; limited carry-over of cuing between reps and sets of activity.  Unable/unsafe to attempt additional OOB/gait this PM; will continue efforts next session, earlier in day if possible.    If plan is discharge home, recommend the following: Assistance with cooking/housework;Direct supervision/assist for medications management;Assist for transportation;Help with stairs or ramp for entrance;Supervision due to cognitive status;A lot of help with walking and/or transfers;A lot of help with bathing/dressing/bathroom   Can travel by private vehicle        Equipment Recommendations       Recommendations for Other Services       Precautions / Restrictions Precautions Precautions: Fall Recall of Precautions/Restrictions: Impaired Precaution/Restrictions Comments: hx of restlessness, agitation and aggression Restrictions Weight Bearing Restrictions Per Provider Order: No     Mobility  Bed Mobility Overal bed mobility: Needs Assistance Bed Mobility: Sit to Supine, Rolling Rolling: Mod assist     Sit to supine: Mod assist, +2 for safety/equipment        Transfers                   General transfer comment: deferred due to increased  confusion, restlessness this date    Ambulation/Gait               General Gait Details: deferred due to increased confusion, restlessness this date   Stairs             Wheelchair Mobility     Tilt Bed    Modified Rankin (Stroke Patients Only)       Balance Overall balance assessment: Needs assistance Sitting-balance support: Feet supported, No upper extremity supported Sitting balance-Leahy Scale: Poor Sitting balance - Comments: persistent posterior lean, limited awareness, limited attempts at spontaneous correction.  Consistent min/mod assist to facilitate anterior weight translation and midline in A/P plane                                    Communication Communication Communication: Impaired Factors Affecting Communication: Hearing impaired  Cognition Arousal: Alert Behavior During Therapy: WFL for tasks assessed/performed   PT - Cognitive impairments: History of cognitive impairments, Safety/Judgement, Problem solving, Sequencing, Initiation, Awareness, Orientation   Orientation impairments: Situation, Time, Place                   PT - Cognition Comments: Patient calm and cooperative; eager/agreeable to OOB activities.  Responds to some simple, verbal cues but often requires hand-over-hand to initiate all tasks due to known dementia.  More distractible this PM (?related to time of day?) Following commands: Impaired Following commands impaired: Follows one step commands with increased time    Cueing Cueing Techniques: Verbal cues, Tactile cues, Gestural cues  Exercises Other Exercises Other Exercises: Max assist to reposition  in sitting edge of bed, with improved pelvic/spinal alignment.  Persistent posterior pelvic tilt, posterior trunk lean with all sitting activites, min/mod assist to correct. Other Exercises: Progressed to incorporate dynamic reaching activities to promote forward trunk lean/anterior weight translation,  min/mod assist.  Requires step-by-step cuing and therapist demonstration for task completion; limited ability to recall task between individual reps of activity.  With reaching and accommodation to midline, able to maintain static sitting for periods of time with close sup this date; however, unable to maintain with divided attention, internal/external distraction of increasing fatigue.    General Comments        Pertinent Vitals/Pain Pain Assessment Pain Assessment: No/denies pain    Home Living                          Prior Function            PT Goals (current goals can now be found in the care plan section) Acute Rehab PT Goals Patient Stated Goal: none stated PT Goal Formulation: With patient Time For Goal Achievement: 11/24/23 Potential to Achieve Goals: Fair Progress towards PT goals: Progressing toward goals    Frequency    Min 1X/week      PT Plan      Co-evaluation PT/OT/SLP Co-Evaluation/Treatment: Yes Reason for Co-Treatment: For patient/therapist safety;Necessary to address cognition/behavior during functional activity PT goals addressed during session: Mobility/safety with mobility;Balance;Proper use of DME;Strengthening/ROM OT goals addressed during session: ADL's and self-care      AM-PAC PT "6 Clicks" Mobility   Outcome Measure  Help needed turning from your back to your side while in a flat bed without using bedrails?: A Lot Help needed moving from lying on your back to sitting on the side of a flat bed without using bedrails?: A Lot Help needed moving to and from a bed to a chair (including a wheelchair)?: Total Help needed standing up from a chair using your arms (e.g., wheelchair or bedside chair)?: Total Help needed to walk in hospital room?: Total Help needed climbing 3-5 steps with a railing? : Total 6 Click Score: 8    End of Session   Activity Tolerance: Patient tolerated treatment well Patient left: in bed;with call  bell/phone within reach;with bed alarm set Nurse Communication: Mobility status PT Visit Diagnosis: Unsteadiness on feet (R26.81);Muscle weakness (generalized) (M62.81);Other abnormalities of gait and mobility (R26.89);Difficulty in walking, not elsewhere classified (R26.2);History of falling (Z91.81)     Time: 1419-1440 PT Time Calculation (min) (ACUTE ONLY): 21 min  Charges:    $Therapeutic Activity: 8-22 mins PT General Charges $$ ACUTE PT VISIT: 1 Visit                     Morry Veiga H. Bevin Bucks, PT, DPT, NCS 10/27/23, 3:47 PM [phone]

## 2023-10-27 NOTE — ED Notes (Signed)
 Pt's brief changed by this RN and physical therapy. Pt is difficult to redirect but following some commands at this time.

## 2023-10-27 NOTE — ED Notes (Signed)
 Pt assisted with meal tray at this time

## 2023-10-27 NOTE — ED Notes (Signed)
 Pt trying to get out of bed at this time. Bed saturated with urine. Pt removed brief prior to this tech and Deadra Everts, Charity fundraiser entering the rm. Pt cleaned and bed linen changed. Warm blankets placed on pt at this time. Pt calm and cooperative.

## 2023-10-27 NOTE — TOC Progression Note (Signed)
 Transition of Care [facility] - Progression Note    Patient Details  Name: Hunter [name] MRN: [id number] Date of Birth: [birth year]  Transition of Care [facility]) CM/SW Contact  Elmira Haddock, LCSW Phone Number: 10/27/2023, 1:42 PM  Clinical Narrative:    CSW reached out to the following facilities about a LT/Memory Care bed:  -[facility] (lvm) -[facility] (No answer) -[facility] (lvm) -[place] (lvm for [employer], Adm Dir) -[facility] (has to live there for LT) -[facility] (lvm for Arelia Been on [phone]) [facility] stated there are no beds available) -[place] (No memory care LTC beds per Carlette Cheers, Administrator) -[facility] (lv for Ellwood Haber)   Expected Discharge Plan:  (TBD) Barriers to Discharge: ED Patient/Family Requesting Placement but Has No Payor  Expected Discharge Plan and Services     Post Acute Care Choice:  (TBD) Living arrangements for the past 2 months: Skilled Nursing Facility                                       Social Determinants of Health (SDOH) Interventions SDOH Screenings   Food Insecurity: No Food Insecurity (06/13/2023)   Received from [facility]  Housing: Low Risk  (06/09/2023)  Transportation Needs: No Transportation Needs (06/13/2023)   Received from [facility]  Utilities: Low Risk  (06/13/2023)   Received from [facility]  Alcohol Screen: Low Risk  (07/06/2021)  Depression (PHQ2-9): Medium Risk (11/26/2022)  Financial Resource Strain: Low Risk  (06/13/2023)   Received from [facility]  Physical Activity: Insufficiently Active (06/13/2023)   Received from [facility]  Social Connections: Moderately Integrated (06/13/2023)   Received from [facility]  Stress: No Stress Concern Present (06/13/2023)   Received from [facility]  Tobacco Use: Low Risk  (08/23/2023)  Recent Concern: Tobacco Use - Medium Risk  (06/14/2023)   Received from [facility] Literacy: Medium Risk (06/13/2023)   Received from [facility]    Readmission Risk Interventions     No data to display

## 2023-10-27 NOTE — ED Notes (Signed)
 RN to bedside to answer bed alarm. Pt turned and hanging legs off side of bed. Pt attempting to get out of bed. Pt repositioned for safety. Pt escalating at this time. RN attempted to verbally deescalate at this time. No further needs identified to deescalate pt.

## 2023-10-27 NOTE — ED Notes (Signed)
 RN to bedside to answer bed alarm. Pt attempting to get out of bed. Pt repositioned in bed.

## 2023-10-27 NOTE — Progress Notes (Signed)
 Occupational Therapy Treatment Patient Details Name: Hunter [name] MRN: [id number] DOB: 1933-07-24 Today's Date: 10/27/2023   History of present illness Pt is an 89 y/o M admitted on 08/24/23 under IVC for aggression with staff & behavioral challenges (per psych, due to baseline dementia) at his facility. PMH: chronic LBP, CKD 3, essential HTN, HLD, L BBB, major depression, neuropathy, PVD.   OT comments  Pt received with BLE over bed rail, increased confusion and restlessness (time of day?). RN in room to assist with brief change and pericare. MAX +2 to reposition for safety, heavy posterior lean and difficult to MAX - TOTAL +2 assist for hygiene with pt tangential and disoriented to general situation, initiates pericare but quickly becomes internally distracted. Stands with MAX A +2 using RW for donning clean brief. PT overlap for remainder of session. OT will continue to progress as able, discharge recommendation appropriate.       If plan is discharge home, recommend the following:  Supervision due to cognitive status;Assist for transportation;Assistance with cooking/housework;Help with stairs or ramp for entrance;Two people to help with walking and/or transfers;Direct supervision/assist for financial management;Direct supervision/assist for medications management;Two people to help with bathing/dressing/bathroom   Equipment Recommendations  Other (comment)       Precautions / Restrictions Precautions Precautions: Fall Recall of Precautions/Restrictions: Impaired Precaution/Restrictions Comments: hx of restlessness, agitation and aggression Restrictions Weight Bearing Restrictions Per Provider Order: No       Mobility Bed Mobility Overal bed mobility: Needs Assistance Bed Mobility: Supine to Sit     Supine to sit: Mod assist Sit to supine: +2 for physical assistance   General bed mobility comments: pt recieved with BLE hanging off bedrail, MOD A to correct to upright posture     Transfers Overall transfer level: Needs assistance Equipment used: Rolling walker (2 wheels) Transfers: Sit to/from Stand Sit to Stand: Mod assist, +2 physical assistance, +2 safety/equipment           General transfer comment: MOD A to stand using RW     Balance Overall balance assessment: Needs assistance Sitting-balance support: No upper extremity supported, Feet supported Sitting balance-Leahy Scale: Poor Sitting balance - Comments: posterior lean, confused and restless. limited ability to correct with RN/OT with toileting needs Postural control: Posterior lean                                 ADL either performed or assessed with clinical judgement   ADL Overall ADL's : Needs assistance/impaired                             Toileting- Clothing Manipulation and Hygiene: Maximal assistance;+2 for physical assistance;Sit to/from stand;Minimal assistance Toileting - Clothing Manipulation Details (indicate cue type and reason): pt able to doff soiled brief with minA, initiates pericare when provided washcloth but requires max A for thoroughness due to restlessness. dons clean brief with total A in standing using RW for UE support     Functional mobility during ADLs: +2 for physical assistance;Moderate assistance;Rolling walker (2 wheels);Cueing for sequencing;Cueing for safety       Communication Communication Communication: Impaired Factors Affecting Communication: Hearing impaired   Cognition Arousal: Alert Behavior During Therapy: WFL for tasks assessed/performed Cognition: History of cognitive impairments, Cognition impaired   Orientation impairments: Place, Time, Situation Awareness: Intellectual awareness impaired Memory impairment (select all impairments): Short-term memory, Working [employer] fast streamer, Non-declarative long-term  memory, Declarative long-term memory Attention impairment (select first level of impairment): Focused attention Executive  functioning impairment (select all impairments): Initiation, Organization, Sequencing, Reasoning, Problem solving OT - Cognition Comments: pleasant and cooperative                 Following commands: Impaired Following commands impaired: Follows one step commands with increased time      Cueing   Cueing Techniques: Verbal cues, Tactile cues, Gestural cues  Exercises Exercises: Other exercises Other Exercises Other Exercises: dynamic reaching activities focusing on forward lean and attn (see PT note for further details)            Pertinent Vitals/ Pain       Pain Assessment Pain Assessment: No/denies pain Pain Score: 0-No pain   Frequency  Min 2X/week        Progress Toward Goals  OT Goals(current goals can now be found in the care plan section)  Progress towards OT goals: Progressing toward goals  Acute Rehab OT Goals OT Goal Formulation: Patient unable to participate in goal setting Time For Goal Achievement: 11/01/23 Potential to Achieve Goals: Fair ADL Goals Pt Will Perform Grooming: with supervision;standing;sitting Pt Will Perform Upper Body Bathing: sitting;with set-up Pt Will Perform Lower Body Bathing: with min assist;sit to/from stand Pt Will Perform Upper Body Dressing: with set-up;sitting Pt Will Perform Lower Body Dressing: with contact guard assist;sitting/lateral leans;sit to/from stand Pt Will Transfer to Toilet: with contact guard assist;bedside commode;ambulating;grab bars Pt Will Perform Toileting - Clothing Manipulation and hygiene: with contact guard assist;sit to/from stand;sitting/lateral leans  Plan      Co-evaluation    PT/OT/SLP Co-Evaluation/Treatment: Yes Reason for Co-Treatment: For patient/therapist safety;Necessary to address cognition/behavior during functional activity PT goals addressed during session: Mobility/safety with mobility;Balance;Proper use of DME;Strengthening/ROM OT goals addressed during session: ADL's and  self-care      AM-PAC OT "6 Clicks" Daily Activity     Outcome Measure   Help from another person eating meals?: None Help from another person taking care of personal grooming?: A Little Help from another person toileting, which includes using toliet, bedpan, or urinal?: A Lot Help from another person bathing (including washing, rinsing, drying)?: A Lot Help from another person to put on and taking off regular upper body clothing?: A Little Help from another person to put on and taking off regular lower body clothing?: A Lot 6 Click Score: 16    End of Session Equipment Utilized During Treatment: Rolling walker (2 wheels)  OT Visit Diagnosis: Unsteadiness on feet (R26.81);Repeated falls (R29.6);Muscle weakness (generalized) (M62.81)   Activity Tolerance Patient limited by fatigue;Other (comment) (increased restlessness and confusion, fatigues quickly)   Patient Left in bed;with bed alarm set;with call bell/phone within reach   Nurse Communication Mobility status        Time: 1413-1440 OT Time Calculation (min): 27 min  Charges: OT General Charges $OT Visit: 1 Visit OT Treatments $Self Care/Home Management : 8-22 mins  Brandt Chaney L. Win Guajardo, OTR/L  10/27/23, 4:38 PM

## 2023-10-27 NOTE — ED Notes (Signed)
VOL TOC placement 

## 2023-10-28 NOTE — ED Notes (Addendum)
 Pt sitting up and eating lunch

## 2023-10-28 NOTE — ED Notes (Signed)
 Pt in bed with bed alarm, grip socks, and fall risk bracelet on.

## 2023-10-28 NOTE — TOC CM/SW Note (Signed)
 Phone calls made to facilities about memory care bed: -[name]:  No memory care offered -[facility]: LVM -[facility]:  No memory care offered -[facility]:  No answer and no vm -[facility]: No answer and no vm -[facility]:  Left vm -[facility]:  No beds avail per [facility]:  LVM -[facility]:  No memory care offered -[facility]:  LVM -[facility]:  LVM [facility]:  LVM -[facility]: LVM -[facility]:  No ans & no vm -[facility]:  No memory care avail -[facility]:  No ans and no vm

## 2023-10-28 NOTE — TOC CM/SW Note (Addendum)
 Received return phone all from [facility] stating that they don't take Medicaid patients, but they take private pay patients at their facility.  1:08pm:  Spoke with patient's guardian, Chance, to discuss a visit from [facility] group home today.  She states that she knows Katherina Pan and will give him a call to make sure he "knows what he's getting himself in to".

## 2023-10-28 NOTE — Progress Notes (Signed)
 Mobility Specialist - Progress Note  During mobility: BP 100/59, SpO2 86% Post-mobility: SPO2 92%   10/28/23 1133  Mobility  Activity Stood at bedside;Transferred from bed to chair  Level of Assistance Maximum assist, patient does 25-49%  Assistive Device Front wheel walker  Distance Ambulated (ft) 4 ft  Activity Response Tolerated well  Mobility visit 1 Mobility  Mobility Specialist Start Time (ACUTE ONLY) 1050  Mobility Specialist Stop Time (ACUTE ONLY) 1109  Mobility Specialist Time Calculation (min) (ACUTE ONLY) 19 min   Pt supine upon entry, utilizing RA. Pt agreeable to participate in getting cleaned and transferring to the recliner. Pt required max multimodal cueing and redirection to task throughout the session this date. Pt completed bed mob ModA, dangled EOB for ~2-4 mins washing face indep while MS completes peri care-- soiled linen and brief. Pt STS to RW MaxA +2 and stood while MS pulls up and tightens brief. Pt transferred to the recliner MaxA +2, left seated with alarm set and needs within reach. RN notified.  Versa Gore Mobility Specialist 10/28/23 11:43 AM

## 2023-10-28 NOTE — ED Notes (Signed)
 Pt resting, eyes closed, respirations even non-labored.

## 2023-10-28 NOTE — ED Notes (Signed)
 Pt cleaned up after bowel movement. New bed sheets, brief, bed pads, and meplilex applied. Pt given a bed bath with cleansing wipes.

## 2023-10-28 NOTE — ED Notes (Signed)
Pt sitting up and eating breakfast

## 2023-10-28 NOTE — ED Notes (Signed)
 Patient's bed changed of stool incontinence. New sacrum pad applied as previous was soiled.

## 2023-10-28 NOTE — ED Provider Notes (Signed)
-----------------------------------------   5:50 AM on 10/28/2023 -----------------------------------------   Blood pressure 109/82, pulse (!) 55, temperature (!) 97.5 F (36.4 C), temperature source Oral, resp. rate 17, height 6' (1.829 m), weight 97.5 kg, SpO2 93%.  The patient is calm and cooperative at this time.  There have been no acute events since the last update.  Awaiting disposition plan from Social Work team.   Rosine Solecki J, MD 10/28/23 [phone]

## 2023-10-29 NOTE — ED Notes (Signed)
 Pt alert, oriented to self only. Pt assisted to recliner with walker. Dirty/wet brief removed. Pt given breakfast tray. Pt denies complaints at this time. Linens changed.

## 2023-10-29 NOTE — ED Notes (Signed)
 Pt continues to sit in recliner with alarm on, call bell in reach, floor mat and non-slip socks in place. Trash from breakfast removed.

## 2023-10-29 NOTE — ED Notes (Addendum)
Pt sleeping, chest rise and fall noted.

## 2023-10-29 NOTE — ED Notes (Signed)
 Pt given apple sauce with bedtime meds.

## 2023-10-29 NOTE — ED Notes (Signed)
 Pt repositioned in recliner, drink brought by tech. Brief is dry at this time.

## 2023-10-29 NOTE — ED Notes (Signed)
 Vol toc placement

## 2023-10-29 NOTE — ED Notes (Signed)
 Pt cleaned of stool. New mepilex and brief applied.

## 2023-10-29 NOTE — Progress Notes (Signed)
 Physical Therapy Treatment Patient Details Name: Hunter [name] MRN: [id number] DOB: 09-02-1933 Today's Date: 10/29/2023   History of Present Illness Pt is an 89 y/o M admitted on 08/24/23 under IVC for aggression with staff & behavioral challenges (per psych, due to baseline dementia) at his facility. PMH: chronic LBP, CKD 3, essential HTN, HLD, L BBB, major depression, neuropathy, PVD.    PT Comments  Patient alert, disoriented, pleasant but very HOH. Seen with OT to maximize safety. Pt with more difficulty with mobility than last session. maxAx2 sit <> stand twice, but unable to come up fully into standing. Ultimately squat pivot to return to bed with maxAx2 and returned to supine. Pt with OT at bedside. The patient would benefit from further skilled PT intervention to continue to progress towards goals.      If plan is discharge home, recommend the following: Assistance with cooking/housework;Direct supervision/assist for medications management;Assist for transportation;Help with stairs or ramp for entrance;Supervision due to cognitive status;A lot of help with walking and/or transfers;A lot of help with bathing/dressing/bathroom   Can travel by private vehicle     No  Equipment Recommendations       Recommendations for Other Services       Precautions / Restrictions Precautions Precautions: Fall Recall of Precautions/Restrictions: Impaired Precaution/Restrictions Comments: hx of restlessness, agitation and aggression Restrictions Weight Bearing Restrictions Per Provider Order: No     Mobility  Bed Mobility Overal bed mobility: Needs Assistance Bed Mobility: Sit to Supine       Sit to supine: Max assist, +2 for safety/equipment        Transfers Overall transfer level: Needs assistance Equipment used: Rolling walker (2 wheels) Transfers: Sit to/from Stand, Bed to chair/wheelchair/BSC Sit to Stand: Max assist, +2 physical assistance     Squat pivot transfers: Max  assist, +2 physical assistance          Ambulation/Gait               General Gait Details: unable to ambulate today due to cognition vs ? weakness   Stairs             Wheelchair Mobility     Tilt Bed    Modified Rankin (Stroke Patients Only)       Balance Overall balance assessment: Needs assistance Sitting-balance support: Feet supported, No upper extremity supported Sitting balance-Leahy Scale: Poor Sitting balance - Comments: persistent posterior lean, limited awareness Postural control: Posterior lean Standing balance support: Bilateral upper extremity supported, During functional activity, Reliant on assistive device for balance Standing balance-Leahy Scale: Zero                              Communication Communication Communication: Impaired Factors Affecting Communication: Hearing impaired  Cognition Arousal: Alert     PT - Cognitive impairments: History of cognitive impairments, Safety/Judgement, Problem solving, Sequencing, Initiation, Awareness, Orientation   Orientation impairments: Situation, Time, Place                   PT - Cognition Comments: Patient calm and cooperative; requires hand-over-hand to initiate all tasks due to known dementia. difficulty initiating motor tasks Following commands: Impaired Following commands impaired: Follows one step commands with increased time    Cueing Cueing Techniques: Verbal cues, Tactile cues, Gestural cues  Exercises      General Comments        Pertinent Vitals/Pain Pain Assessment Pain Assessment: Faces Faces Pain  Scale: No hurt    Home Living                          Prior Function            PT Goals (current goals can now be found in the care plan section) Progress towards PT goals: Progressing toward goals    Frequency    Min 1X/week      PT Plan      Co-evaluation PT/OT/SLP Co-Evaluation/Treatment: Yes Reason for Co-Treatment: For  patient/therapist safety;Necessary to address cognition/behavior during functional activity PT goals addressed during session: Mobility/safety with mobility;Balance;Proper use of DME;Strengthening/ROM OT goals addressed during session: ADL's and self-care      AM-PAC PT "6 Clicks" Mobility   Outcome Measure  Help needed turning from your back to your side while in a flat bed without using bedrails?: A Lot Help needed moving from lying on your back to sitting on the side of a flat bed without using bedrails?: A Lot Help needed moving to and from a bed to a chair (including a wheelchair)?: Total Help needed standing up from a chair using your arms (e.g., wheelchair or bedside chair)?: Total Help needed to walk in hospital room?: Total Help needed climbing 3-5 steps with a railing? : Total 6 Click Score: 8    End of Session   Activity Tolerance: Patient tolerated treatment well Patient left: in bed;with call bell/phone within reach;with bed alarm set Nurse Communication: Mobility status PT Visit Diagnosis: Unsteadiness on feet (R26.81);Muscle weakness (generalized) (M62.81);Other abnormalities of gait and mobility (R26.89);Difficulty in walking, not elsewhere classified (R26.2);History of falling (Z91.81)     Time: 1610-9604 PT Time Calculation (min) (ACUTE ONLY): 23 min  Charges:    $Therapeutic Activity: 8-22 mins PT General Charges $$ ACUTE PT VISIT: 1 Visit                     Darien Eden PT, DPT 2:16 PM,10/29/23

## 2023-10-29 NOTE — Progress Notes (Signed)
 Occupational Therapy Treatment Patient Details Name: Hunter [name] MRN: [id number] DOB: [birth year] Today's Date: 10/29/2023   History of present illness Pt is an 89 y/o M admitted on 08/24/23 under IVC for aggression with staff & behavioral challenges (per psych, due to baseline dementia) at his facility. PMH: chronic LBP, CKD 3, essential HTN, HLD, L BBB, major depression, neuropathy, PVD.   OT comments  Pt received in recliner, finishing lunch. Fluctuating with significant decline in mobility this session. PT/OT co-tx session as pt is globally weak and requires +2 for skilled intervention. Attempted to perform standing from recliner 2x trials, pt requires MAX A +2 and is unable to perform weight translation to rise fully to standing. Persist ant posterior lean, cannot achieve knee/trunk/hip extension into standing. Washes face with setup in recliner, doffs gown with supervision. Squat pivot recliner > bed performed with MAX A +2 as pt did not demo cognitive/physical abilities this session to motor plan full stand pivot transfer. Pt remains pleasant and cooperative. OT will continue to progress towards functional gains.       If plan is discharge home, recommend the following:  Supervision due to cognitive status;Assist for transportation;Assistance with cooking/housework;Help with stairs or ramp for entrance;Two people to help with walking and/or transfers;Direct supervision/assist for financial management;Direct supervision/assist for medications management;Two people to help with bathing/dressing/bathroom   Equipment Recommendations  Other (comment)       Precautions / Restrictions Precautions Precautions: Fall Recall of Precautions/Restrictions: Impaired Precaution/Restrictions Comments: hx of restlessness, agitation and aggression Restrictions Weight Bearing Restrictions Per Provider Order: No       Mobility Bed Mobility Overal bed mobility: Needs Assistance Bed Mobility: Sit to  Supine       Sit to supine: Max assist, +2 for safety/equipment        Transfers Overall transfer level: Needs assistance Equipment used: Rolling walker (2 wheels) Transfers: Sit to/from Stand, Bed to chair/wheelchair/BSC Sit to Stand: Max assist, +2 physical assistance   Squat pivot transfers: Max assist, +2 physical assistance       General transfer comment: decline in transfers (? time of day, or cognition), pt unable to rise to full stand using RW, posterior lean, +2 MAX with HHA squat pivot back to bed. will trial stedy next session     Balance Overall balance assessment: Needs assistance Sitting-balance support: Feet supported, No upper extremity supported Sitting balance-Leahy Scale: Poor Sitting balance - Comments: persistent posterior lean, limited awareness Postural control: Posterior lean Standing balance support: Bilateral upper extremity supported, During functional activity, Reliant on assistive device for balance Standing balance-Leahy Scale: Zero Standing balance comment: pt remains high fall risk, required max multimodal cuing to extend knees fully. unable to rise to complete stand, significantly weak.                           ADL either performed or assessed with clinical judgement   ADL Overall ADL's : Needs assistance/impaired     Grooming: Wash/dry hands;Wash/dry face;Set up;Sitting Grooming Details (indicate cue type and reason): recliner level                             Functional mobility during ADLs: +2 for physical assistance;Rolling walker (2 wheels);Cueing for sequencing;Cueing for safety;Maximal assistance       Communication Communication Communication: Impaired Factors Affecting Communication: Hearing impaired   Cognition Arousal: Alert Behavior During Therapy: [facility] for tasks assessed/performed  Cognition: History of cognitive impairments, Cognition impaired   Orientation impairments: Place, Time,  Situation Awareness: Intellectual awareness impaired Memory impairment (select all impairments): Short-term memory, Working civil service fast streamer, Non-declarative long-term memory, Geneticist, Molecular long-term memory Attention impairment (select first level of impairment): Focused attention Executive functioning impairment (select all impairments): Initiation, Organization, Sequencing, Reasoning, Problem solving OT - Cognition Comments: pleasant and cooperative. thinks he is at the [employer] today.                 Following commands: Impaired Following commands impaired: Follows one step commands with increased time      Cueing   Cueing Techniques: Verbal cues, Tactile cues, Gestural cues             Pertinent Vitals/ Pain       Pain Assessment Pain Assessment: No/denies pain Faces Pain Scale: No hurt   Frequency  Min 2X/week        Progress Toward Goals  OT Goals(current goals can now be found in the care plan section)  Progress towards OT goals: Not progressing toward goals - comment (increased physical assist)  Acute Rehab OT Goals OT Goal Formulation: Patient unable to participate in goal setting Time For Goal Achievement: 11/01/23 Potential to Achieve Goals: Fair ADL Goals Pt Will Perform Grooming: with supervision;standing;sitting Pt Will Perform Upper Body Bathing: sitting;with set-up Pt Will Perform Lower Body Bathing: with min assist;sit to/from stand Pt Will Perform Upper Body Dressing: with set-up;sitting Pt Will Perform Lower Body Dressing: with contact guard assist;sitting/lateral leans;sit to/from stand Pt Will Transfer to Toilet: with contact guard assist;bedside commode;ambulating;grab bars Pt Will Perform Toileting - Clothing Manipulation and hygiene: with contact guard assist;sit to/from stand;sitting/lateral leans  Plan      Co-evaluation    PT/OT/SLP Co-Evaluation/Treatment: Yes Reason for Co-Treatment: For patient/therapist safety;Necessary to  address cognition/behavior during functional activity PT goals addressed during session: Mobility/safety with mobility;Balance;Proper use of DME;Strengthening/ROM OT goals addressed during session: ADL's and self-care      AM-PAC OT 6 Clicks Daily Activity     Outcome Measure   Help from another person eating meals?: None Help from another person taking care of personal grooming?: A Little Help from another person toileting, which includes using toliet, bedpan, or urinal?: A Lot Help from another person bathing (including washing, rinsing, drying)?: A Lot Help from another person to put on and taking off regular upper body clothing?: A Little Help from another person to put on and taking off regular lower body clothing?: A Lot 6 Click Score: 16    End of Session Equipment Utilized During Treatment: Gait belt;Rolling walker (2 wheels)  OT Visit Diagnosis: Unsteadiness on feet (R26.81);Repeated falls (R29.6);Muscle weakness (generalized) (M62.81)   Activity Tolerance Patient tolerated treatment well   Patient Left in bed;with bed alarm set;with call bell/phone within reach   Nurse Communication Mobility status        Time: 8694-8666 OT Time Calculation (min): 28 min  Charges: OT General Charges $OT Visit: 1 Visit OT Treatments $Self Care/Home Management : 8-22 mins  Rahil Passey L. Codie Krogh, OTR/L  10/29/23, 4:43 PM

## 2023-10-29 NOTE — TOC Progression Note (Signed)
 Transition of Care [facility]) - Progression Note    Patient Details  Name: Hunter [name] MRN: [id number] Date of Birth: [birth year]  [facility]) CM/SW Contact  Siddh Vandeventer E Yuepheng Schaller, LCSW Phone Number: 10/29/2023, 12:18 PM  Clinical Narrative:    Per TOC handoff, Marcello Sero was supposed to come assess patient today for his facilities. CSW attempted call to [facility] to follow up - no answer and VM is full.   Expected Discharge Plan:  (TBD) Barriers to Discharge: ED Patient/Family Requesting Placement but Has No Payor  Expected Discharge Plan and Services     Post Acute Care Choice:  (TBD) Living arrangements for the past 2 months: Skilled Nursing Facility                                       Social Determinants of Health (SDOH) Interventions SDOH Screenings   Food Insecurity: No Food Insecurity (06/13/2023)   Received from [facility]  Housing: Low Risk  (06/09/2023)  Transportation Needs: No Transportation Needs (06/13/2023)   Received from [facility]  Utilities: Low Risk  (06/13/2023)   Received from [facility]  Alcohol Screen: Low Risk  (07/06/2021)  Depression (PHQ2-9): Medium Risk (11/26/2022)  Financial Resource Strain: Low Risk  (06/13/2023)   Received from [facility]  Physical Activity: Insufficiently Active (06/13/2023)   Received from [facility]  Social Connections: Moderately Integrated (06/13/2023)   Received from [facility]  Stress: No Stress Concern Present (06/13/2023)   Received from [facility]  Tobacco Use: Low Risk  (08/23/2023)  Recent Concern: Tobacco Use - Medium Risk (06/14/2023)   Received from [facility] Literacy: Medium Risk (06/13/2023)   Received from [facility]    Readmission Risk Interventions     No data to display

## 2023-10-29 NOTE — ED Notes (Signed)
 Pt has spilled liquid all over gown and blankets. Pt's brief, gown, and blankets changed. Bed alarm on.

## 2023-10-29 NOTE — ED Notes (Signed)
 Pt trying to climb out of bed. Pt pulled up in bed.

## 2023-10-29 NOTE — ED Notes (Signed)
 VOL  TOC  PLACEMENT

## 2023-10-29 NOTE — ED Notes (Addendum)
 New brief applied. Old sacral dressing removed, new mepilex applied to sacrum. Sacrum appears warm, dry, and intact. Non-blanchable redness noted over sacrum.

## 2023-10-29 NOTE — ED Notes (Signed)
 Pt trying to get out of bed and cussing at staff. Pt pulled up in bed and covers pulled up.

## 2023-10-30 ENCOUNTER — Emergency Department

## 2023-10-30 DIAGNOSIS — R0989 Other specified symptoms and signs involving the circulatory and respiratory systems: Secondary | ICD-10-CM | POA: Diagnosis not present

## 2023-10-30 DIAGNOSIS — I700 Atherosclerosis of aorta: Secondary | ICD-10-CM | POA: Diagnosis not present

## 2023-10-30 LAB — COMPREHENSIVE METABOLIC PANEL WITH GFR
ALT: 28 U/L (ref 0–44)
AST: 40 U/L (ref 15–41)
Albumin: 3.1 g/dL — ABNORMAL LOW (ref 3.5–5.0)
Alkaline Phosphatase: 100 U/L (ref 38–126)
Anion gap: 11 (ref 5–15)
BUN: 37 mg/dL — ABNORMAL HIGH (ref 8–23)
CO2: 25 mmol/L (ref 22–32)
Calcium: 8.7 mg/dL — ABNORMAL LOW (ref 8.9–10.3)
Chloride: 99 mmol/L (ref 98–111)
Creatinine, Ser: 1.49 mg/dL — ABNORMAL HIGH (ref 0.61–1.24)
GFR, Estimated: 45 mL/min — ABNORMAL LOW (ref >60)
Glucose, Bld: 110 mg/dL — ABNORMAL HIGH (ref 70–99)
Potassium: 3.3 mmol/L — ABNORMAL LOW (ref 3.5–5.1)
Sodium: 135 mmol/L (ref 135–145)
Total Bilirubin: 0.7 mg/dL (ref 0.0–1.2)
Total Protein: 5.1 g/dL — ABNORMAL LOW (ref 6.5–8.1)

## 2023-10-30 LAB — CBC WITH DIFFERENTIAL/PLATELET
Abs Immature Granulocytes: 0.03 10*3/uL (ref 0.00–0.07)
Basophils Absolute: 0 10*3/uL (ref 0.0–0.1)
Basophils Relative: 0 %
Eosinophils Absolute: 0.1 10*3/uL (ref 0.0–0.5)
Eosinophils Relative: 1 %
HCT: 37.2 % — ABNORMAL LOW (ref 39.0–52.0)
Hemoglobin: 12.4 g/dL — ABNORMAL LOW (ref 13.0–17.0)
Immature Granulocytes: 1 %
Lymphocytes Relative: 42 %
Lymphs Abs: 2.4 10*3/uL (ref 0.7–4.0)
MCH: 26.9 pg (ref 26.0–34.0)
MCHC: 33.3 g/dL (ref 30.0–36.0)
MCV: 80.7 fL (ref 80.0–100.0)
Monocytes Absolute: 0.5 10*3/uL (ref 0.1–1.0)
Monocytes Relative: 9 %
Neutro Abs: 2.7 10*3/uL (ref 1.7–7.7)
Neutrophils Relative %: 47 %
Platelets: 83 10*3/uL — ABNORMAL LOW (ref 150–400)
RBC: 4.61 MIL/uL (ref 4.22–5.81)
RDW: 15.1 % (ref 11.5–15.5)
WBC: 5.7 10*3/uL (ref 4.0–10.5)
nRBC: 0 % (ref 0.0–0.2)

## 2023-10-30 LAB — URINALYSIS, W/ REFLEX TO CULTURE (INFECTION SUSPECTED)
Bacteria, UA: NONE SEEN
Bilirubin Urine: NEGATIVE
Glucose, UA: NEGATIVE mg/dL
Hgb urine dipstick: NEGATIVE
Ketones, ur: NEGATIVE mg/dL
Leukocytes,Ua: NEGATIVE
Nitrite: NEGATIVE
Protein, ur: NEGATIVE mg/dL
Specific Gravity, Urine: 1.015 (ref 1.005–1.030)
pH: 5 (ref 5.0–8.0)

## 2023-10-30 LAB — LACTIC ACID, PLASMA
Lactic Acid, Venous: 1.6 mmol/L (ref 0.5–1.9)
Lactic Acid, Venous: 2.1 mmol/L (ref 0.5–1.9)
Lactic Acid, Venous: 2.2 mmol/L (ref 0.5–1.9)
Lactic Acid, Venous: 0.9 mmol/L (ref 0.5–1.9)

## 2023-10-30 MED ORDER — LACTATED RINGERS IV BOLUS (SEPSIS)
1000.0000 mL | Freq: Once | INTRAVENOUS | Status: AC
  Administered 2023-10-30: 1000 mL via INTRAVENOUS

## 2023-10-30 NOTE — ED Notes (Signed)
 Pt repositioned in bed, pericare and dry brief provided, meal tray and milk given.  Pt eating at this time.

## 2023-10-30 NOTE — ED Notes (Signed)
 VOL/TOC Placement

## 2023-10-30 NOTE — ED Notes (Signed)
 Pt has attempted multiple times to climb out of bed and has been non-redirectable with staff.

## 2023-10-30 NOTE — ED Notes (Signed)
 PT trying to get out of bed. Pt's legs put back in bed and pt pulled up in bed.

## 2023-10-30 NOTE — TOC Progression Note (Addendum)
 Transition of Care [facility]) - Progression Note    Patient Details  Name: Hunter [name] MRN: [id number] Date of Birth: 24-Apr-1934  Transition of Care [facility]) CM/SW Contact  Emmanual Gauthreaux E Chesky Heyer, LCSW Phone Number: 10/30/2023, 12:36 PM  Clinical Narrative:    CSW called Katherina Pan - he states he plans to come assess the patient today between 4 and 5 pm for his facility in [place] [facility] Clement requested FL2 be emailed to him. CSW will send FL2 by encrypted email. Updated bedside RN of visit planned for this afternoon. Checked with MD - per MD, patient has not had disruptive behaviors. Has trouble sleeping and Seroquel has been increased to assist with this.   Expected Discharge Plan:  (TBD) Barriers to Discharge: ED Patient/Family Requesting Placement but Has No Payor  Expected Discharge Plan and Services     Post Acute Care Choice:  (TBD) Living arrangements for the past 2 months: Skilled Nursing Facility                                       Social Determinants of Health (SDOH) Interventions SDOH Screenings   Food Insecurity: No Food Insecurity (06/13/2023)   Received [facility]  Housing: Low Risk  (06/09/2023)  Transportation Needs: No Transportation Needs (06/13/2023)   Received from [facility]  Utilities: Low Risk  (06/13/2023)   Received from [facility]  Alcohol Screen: Low Risk  (07/06/2021)  Depression (PHQ2-9): Medium Risk (11/26/2022)  Financial Resource Strain: Low Risk  (06/13/2023)   Received from [facility]  Physical Activity: Insufficiently Active (06/13/2023)   Received from [facility]  Social Connections: Moderately Integrated (06/13/2023)   Received from [facility]  Stress: No Stress Concern Present (06/13/2023)   Received from [facility]  Tobacco Use: Low Risk  (08/23/2023)  Recent Concern: Tobacco Use - Medium Risk (06/14/2023)   Received from [facility] Literacy: Medium Risk (06/13/2023)   Received  from [facility]    Readmission Risk Interventions     No data to display

## 2023-10-30 NOTE — [place] FL2 (Signed)
 [place]  MEDICAID FL2 LEVEL OF CARE FORM     IDENTIFICATION  Patient Name: Hunter [name] Birthdate: 11-04-1933 Sex: male Admission Date (Current Location): 08/24/2023  [facility] and [place] Number:  Chiropodist and Address:  [facility], [address] [place], [place] [address]      Provider Number: [id number]  Attending Physician Name and Address:  No att. providers found  Relative Name and Phone Number:  Hunter [name] (Legal Guardian)  [phone] [name])    Current Level of Care: Hospital Recommended Level of Care: [facility] Prior Approval Number:    Date Approved/Denied:   PASRR Number: [id number]  Discharge Plan: SNF    Current Diagnoses: Patient Active Problem List   Diagnosis Date Noted   Aggressive behavior due to dementia (HCC) 08/24/2023   Physically aggressive behavior 08/24/2023   Pulmonary nodule 01/24/2023   Hallucinations, unspecified 01/14/2023   Cellulitis of right lower extremity 06/17/2021   Lumbar stenosis with neurogenic claudication 05/24/2020   Dementia (HCC) 06/21/2019   Acute blood loss as cause of postoperative anemia 10/26/2017   Delirium 10/24/2017   Cardiac pacemaker 10/22/2017   Fracture of unspecified part of neck of left femur, initial encounter for closed fracture (HCC) 10/21/2017   Complete heart block (HCC) 02/08/2017   BPH (benign prostatic hyperplasia) 05/10/2015   Skin lesion of left lower extremity 03/26/2014   Stasis eczema 03/26/2014   History of skin cancer 03/26/2014   Heart failure with preserved ejection fraction (HFpEF)  12/06/2013   Syncope 08/14/2013   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 06/15/2012   Thrombocytopenia (HCC) 05/23/2012   Neuropathy (HCC) 11/02/2011   CKD (chronic kidney disease) 05/27/2011   Low back pain 04/08/2011   LBBB (left bundle branch block) 05/12/2010   Abnormality of gait 04/23/2010   Spinal stenosis in cervical region 03/21/2010    Constipation 03/19/2010   Urinary frequency 09/05/2009   Acquired hypothyroidism 08/15/2008   Vitamin D  deficiency 08/15/2008   Anemia 08/15/2008   Venous stasis 08/15/2008   Mixed hyperlipidemia 02/02/2007   Major depression in full remission (HCC) 02/02/2007   Essential hypertension 02/02/2007   Allergic rhinitis 02/02/2007   GERD (gastroesophageal reflux disease) 02/02/2007    Orientation RESPIRATION BLADDER Height & Weight     Self  Normal Incontinent Weight: 215 lb (97.5 kg) Height:  6' (182.9 cm)  BEHAVIORAL SYMPTOMS/MOOD NEUROLOGICAL BOWEL NUTRITION STATUS  Other (Comment)  (Dementia) Incontinent Diet (dys 2)  AMBULATORY STATUS COMMUNICATION OF NEEDS Skin   Extensive Assist Verbally Skin abrasions, Other (Comment) (stage 1 - sacrum - foam dressing)                       Personal Care Assistance Level of Assistance  Bathing, Feeding, Dressing Bathing Assistance: Maximum assistance Feeding assistance: Limited assistance Dressing Assistance: Maximum assistance     Functional Limitations Info  Sight, Hearing, Speech Sight Info: Impaired Hearing Info: Impaired Speech Info: Adequate    SPECIAL CARE FACTORS FREQUENCY                       Contractures Contractures Info: Not present    Additional Factors Info  Code Status, Allergies, Isolation Precautions Code Status Info: Full code Allergies Info: nkda     Isolation Precautions Info: enteric     Current Medications (10/30/2023):  This is the current hospital active medication list Current Facility-Administered Medications  Medication Dose Route Frequency Provider Last Rate  Last Admin   acetaminophen  (TYLENOL ) tablet 650 mg  650 mg Oral Q6H PRN Buell Carmin, MD   650 mg at 10/19/23 1629   albuterol  (PROVENTIL ) (2.5 MG/3ML) 0.083% nebulizer solution 2.5 mg  2.5 mg Inhalation Q8H PRN Buell Carmin, MD       amLODipine  (NORVASC ) tablet 2.5 mg  2.5 mg Oral Daily Buell Carmin, MD   2.5 mg at 10/30/23 0900    aspirin  chewable tablet 81 mg  81 mg Oral Daily Buell Carmin, MD   81 mg at 10/30/23 0901   atorvastatin  (LIPITOR) tablet 20 mg  20 mg Oral QHS Buell Carmin, MD   20 mg at 10/29/23 2114   cyanocobalamin  (VITAMIN B12) injection 1,000 mcg  1,000 mcg Intramuscular Q30 days Buell Carmin, MD   1,000 mcg at 10/22/23 1107   divalproex  (DEPAKOTE ) DR tablet 500 mg  500 mg Oral TID Bradler, Evan K, MD   500 mg at 10/30/23 0901   ferrous sulfate  tablet 325 mg  325 mg Oral Q breakfast Buell Carmin, MD   325 mg at 10/30/23 4098   furosemide  (LASIX ) tablet 80 mg  80 mg Oral Daily Buell Carmin, MD   80 mg at 10/30/23 0900   levothyroxine  (SYNTHROID ) tablet 75 mcg  75 mcg Oral q morning Buell Carmin, MD   75 mcg at 10/30/23 0901   losartan  (COZAAR ) tablet 50 mg  50 mg Oral Daily Buell Carmin, MD   50 mg at 10/30/23 0901   OLANZapine  (ZYPREXA ) injection 5 mg  5 mg Intramuscular TID PRN Saucier, Jonathan Lee, NP   5 mg at 10/30/23 0438   OLANZapine  (ZYPREXA ) tablet 2.5 mg  2.5 mg Oral QHS Saucier, Jonathan Lee, NP   2.5 mg at 10/29/23 2114   OLANZapine  (ZYPREXA ) tablet 2.5 mg  2.5 mg Oral [id number] Lee, Jacqueline Eun, NP   2.5 mg at 10/30/23 0816   pantoprazole  (PROTONIX ) EC tablet 40 mg  40 mg Oral Daily Buell Carmin, MD   40 mg at 10/30/23 0900   sertraline  (ZOLOFT ) tablet 150 mg  150 mg Oral Daily Lee, Jacqueline Eun, NP   150 mg at 10/30/23 0901   Current Outpatient Medications  Medication Sig Dispense Refill   albuterol  (VENTOLIN  HFA) 108 (90 Base) MCG/ACT inhaler Inhale 2 puffs into the lungs every 8 (eight) hours as needed for wheezing or shortness of breath.     amLODipine  (NORVASC ) 2.5 MG tablet TAKE 1 TABLET BY MOUTH DAILY 30 tablet 3   aspirin  81 MG chewable tablet Chew 81 mg by mouth daily.      atorvastatin  (LIPITOR) 20 MG tablet TAKE 1 TABLET BY MOUTH DAILY (Patient taking differently: Take 20 mg by mouth at bedtime.) 90 tablet 3   cyanocobalamin  (VITAMIN B12) 1000 MCG/ML injection Inject 1,000 mcg into  the muscle every 30 (thirty) days.     divalproex  (DEPAKOTE ) 125 MG DR tablet Take 125 mg by mouth 3 (three) times daily.     ferrous sulfate  325 (65 FE) MG tablet Take 325 mg by mouth daily with breakfast.     furosemide  (LASIX ) 80 MG tablet Take 1 tablet (80 mg total) by mouth daily. 30 tablet 6   levothyroxine  (SYNTHROID ) 75 MCG tablet TAKE 1 TABLET BY MOUTH EVERY MORNING 90 tablet 1   losartan  (COZAAR ) 50 MG tablet TAKE 1 TABLET BY MOUTH DAILY 90 tablet 1   OLANZapine  (ZYPREXA ) 2.5 MG tablet Take 2.5 mg by mouth in the morning and at bedtime.  omeprazole  (PRILOSEC) 20 MG capsule Take 20 mg by mouth daily.     sennosides-docusate sodium  (SENOKOT-S) 8.6-50 MG tablet Take 2 tablets by mouth daily.     sertraline  (ZOLOFT ) 100 MG tablet TAKE 2 TABLETS BY MOUTH DAILY 180 tablet 3   ciprofloxacin  (CILOXAN ) 0.3 % ophthalmic solution Place 2 drops into both eyes every 4 (four) hours while awake. (Patient not taking: Reported on 08/24/2023)     Cyanocobalamin  (B-12 PO) Take 1 tablet by mouth daily. (Patient not taking: Reported on 05/31/2023)     Vitamin D , Ergocalciferol , (DRISDOL ) 1.25 MG (50000 UNIT) CAPS capsule TAKE 1 CAPSULE BY MOUTH EVERY 7  DAYS 15 capsule 2     Discharge Medications: Please see discharge summary for a list of discharge medications.  Relevant Imaging Results:  Relevant Lab Results:   Additional Information SS#: [id number]  Hunter Wilk E Kennette Cuthrell, LCSW

## 2023-10-30 NOTE — ED Notes (Signed)
 Pt resting in bed with eyes closed. Rise and fall of chest present.

## 2023-10-30 NOTE — ED Notes (Signed)
 Changed patients bed sheets, bed pads, and brief. Patient was attempting to crawl out of bed. RN re-oriented patient and turned on TV to golf channel.

## 2023-10-30 NOTE — ED Notes (Signed)
 Attempt to place patient legs back on bed and change brief. Pt swinging his arms and legs at this nurse. Attempt to talk to pt to calm pt. Pt continues shouting about "His name is on the sheet." "This isn't my bed." Nurse Tech called in room to attempt to change pt as he had returned to calm state. Pt begins to shout and swing arms again. Pt medicated per eMAR.

## 2023-10-30 NOTE — ED Provider Notes (Addendum)
-----------------------------------------   1:19 AM on 10/30/2023 -----------------------------------------  I was informed by the patient's nurse that 4 different blood pressure measurements, 2 on each arm, has been hypotensive.  The patient is otherwise at his baseline and not complaining of anything and does not appear to be ill.  However the hypotension is new.  He has been boarding for 1610 hours.  I will initiate a "possible sepsis" workup and requested checking rectal temperature and performing an In-N-Out catheterization for urine.  I ordered LR 1 L IV bolus.  ----------------------------------------- 3:47 AM on 10/30/2023 -----------------------------------------  Patient's lab work has all been reassuring and at baseline.  No leukocytosis, normal lactic acid, negative urinalysis, stable chronic kidney disease, etc.  Rectal temperature was 99.2 degrees.  After fluids the patient's blood pressure has come up to 113 systolic even though the diastolic is still a bit low.  However he is at his baseline.  I reassessed the patient personally and he does not appear acutely ill and is appropriate for continued TOC boarding status.  He is conversant, cheerful, denied any pain or shortness of breath, is currently pleasant despite his dementia without evidence of acute illness.   ----------------------------------------- 4:22 AM on 10/30/2023 -----------------------------------------  The patient's second lactic acid was 2.1.  However, he has had a reassuring workup as previously documented and I suspect this was due to the necessity to use a tourniquet when the blood was drawn as per the nurse who did the blood draw.  We will send a straight stick sample for a third lactic acid which I anticipate will be back within normal limits.   ----------------------------------------- 6:02 AM on 10/30/2023 -----------------------------------------  Third lactic acid was 2.2.  However the patient remains  hemodynamically stable and asymptomatic.  I see little point in placing another IV to provide more fluids, and given his lack of mobility I fear that additional fluids may be detrimental to him if it happens too quickly.  I am placing a nursing communication order to perform a straight stick and draw another lactic acid (and order another lactic acid level) after the patient has breakfast.  I will discuss with the oncoming ED physician in the morning the plan and let the daytime team know about the results overnight.   Lynnda Sas, MD 10/30/23 [phone]

## 2023-10-31 NOTE — ED Notes (Signed)
 Lunch tray given.

## 2023-10-31 NOTE — ED Notes (Signed)
 Pt given cup of coffee per his request.  Continues to be verbally aggressive at this time.

## 2023-10-31 NOTE — ED Notes (Signed)
 Patient resting with eyes closed.  RR even and unlabored.  Patient is easily aroused by verbal stimuli or light touch.  No needs voiced at present.  No acute distress noted.

## 2023-10-31 NOTE — ED Notes (Signed)
 RN spoke with pt upon shift change.  RN notes pt has food tray setup in front of him, but pt did not eat.  RN offered to help pt eat but pt declined stating "I'm not hungry."  Pt denies any needs or wants at this time.

## 2023-10-31 NOTE — ED Notes (Signed)
 Breakfast tray given.

## 2023-10-31 NOTE — ED Notes (Signed)
 Pt attempting to get out of bed.  RN assessed pt and noted bed was wet.  RN and tech changed pt gown, bedding, and pads.  Peri care performed. RN provided pt a diet coke.  Pt sitting comfortably in bed at this time.

## 2023-10-31 NOTE — ED Provider Notes (Signed)
 [facility] Observation Note   ----------------------------------------- 12:55 PM on 10/31/2023 -----------------------------------------  Hunter [name] is a 89 y.o. male currently boarding in the Emergency Department.  No acute events since last update.  Recent Vitals   Most recent vital signs: Vitals:   10/30/23 1135 10/31/23 0013  BP:  124/78  Pulse:  63  Resp:  18  Temp: 98.4 F (36.9 C) 98.6 F (37 C)  SpO2:  94%    ED Results / Procedures / Treatments   Labs (all labs ordered are listed, but only abnormal results are displayed) Labs Reviewed  GASTROINTESTINAL PANEL BY PCR, STOOL (REPLACES STOOL CULTURE) - Abnormal; Notable for the following components:      Result Value   Norovirus GI/GII DETECTED (*)    All other components within normal limits  URINE CULTURE - Abnormal; Notable for the following components:   Culture >=100,000 COLONIES/mL ESCHERICHIA COLI (*)    Organism ID, Bacteria ESCHERICHIA COLI (*)    All other components within normal limits  URINE CULTURE - Abnormal; Notable for the following components:   Culture >=100,000 COLONIES/mL ESCHERICHIA COLI (*)    Organism ID, Bacteria ESCHERICHIA COLI (*)    All other components within normal limits  CBC - Abnormal; Notable for the following components:   Platelets 134 (*)    All other components within normal limits  COMPREHENSIVE METABOLIC PANEL - Abnormal; Notable for the following components:   Potassium 3.2 (*)    Glucose, Bld 104 (*)    Creatinine, Ser 1.38 (*)    GFR, Estimated 49 (*)    All other components within normal limits  URINALYSIS, ROUTINE W REFLEX MICROSCOPIC - Abnormal; Notable for the following components:   Color, Urine YELLOW (*)    APPearance TURBID (*)    Hgb urine dipstick SMALL (*)    Protein, ur 100 (*)    Nitrite POSITIVE (*)    Leukocytes,Ua MODERATE (*)    Bacteria, UA MANY (*)    All other components within normal limits  BASIC METABOLIC  PANEL - Abnormal; Notable for the following components:   Glucose, Bld 103 (*)    BUN 27 (*)    Creatinine, Ser 1.49 (*)    GFR, Estimated 45 (*)    All other components within normal limits  URINALYSIS, W/ REFLEX TO CULTURE (INFECTION SUSPECTED) - Abnormal; Notable for the following components:   Color, Urine YELLOW (*)    APPearance TURBID (*)    Hgb urine dipstick MODERATE (*)    Protein, ur 100 (*)    Leukocytes,Ua MODERATE (*)    Bacteria, UA FEW (*)    All other components within normal limits  BASIC METABOLIC PANEL - Abnormal; Notable for the following components:   Glucose, Bld 104 (*)    Creatinine, Ser 1.34 (*)    Calcium  8.7 (*)    GFR, Estimated 51 (*)    All other components within normal limits  VALPROIC ACID  LEVEL - Abnormal; Notable for the following components:   Valproic Acid  Lvl 21 (*)    All other components within normal limits  VALPROIC ACID  LEVEL - Abnormal; Notable for the following components:   Valproic Acid  Lvl 22 (*)    All other components within normal limits  URINALYSIS, ROUTINE W REFLEX MICROSCOPIC - Abnormal; Notable for the following components:   Color, Urine YELLOW (*)    APPearance TURBID (*)    Hgb urine dipstick MODERATE (*)    Protein, ur  30 (*)    Leukocytes,Ua LARGE (*)    Bacteria, UA MANY (*)    All other components within normal limits  CBC WITH DIFFERENTIAL/PLATELET - Abnormal; Notable for the following components:   Hemoglobin 12.0 (*)    HCT 36.2 (*)    Platelets 103 (*)    All other components within normal limits  BASIC METABOLIC PANEL - Abnormal; Notable for the following components:   Glucose, Bld 123 (*)    BUN 27 (*)    Creatinine, Ser 1.35 (*)    Calcium  8.7 (*)    GFR, Estimated 51 (*)    All other components within normal limits  VALPROIC ACID  LEVEL - Abnormal; Notable for the following components:   Valproic Acid  Lvl 24 (*)    All other components within normal limits  BASIC METABOLIC PANEL WITH GFR - Abnormal;  Notable for the following components:   Glucose, Bld 112 (*)    Creatinine, Ser 1.49 (*)    Calcium  8.8 (*)    GFR, Estimated 45 (*)    All other components within normal limits  VALPROIC ACID  LEVEL - Abnormal; Notable for the following components:   Valproic Acid  Lvl 43 (*)    All other components within normal limits  CBC WITH DIFFERENTIAL/PLATELET - Abnormal; Notable for the following components:   Hemoglobin 12.2 (*)    HCT 35.9 (*)    MCV 77.5 (*)    RDW 15.6 (*)    Platelets 104 (*)    All other components within normal limits  LACTIC ACID, PLASMA - Abnormal; Notable for the following components:   Lactic Acid, Venous 2.1 (*)    All other components within normal limits  COMPREHENSIVE METABOLIC PANEL WITH GFR - Abnormal; Notable for the following components:   Potassium 3.3 (*)    Glucose, Bld 110 (*)    BUN 37 (*)    Creatinine, Ser 1.49 (*)    Calcium  8.7 (*)    Total Protein 5.1 (*)    Albumin 3.1 (*)    GFR, Estimated 45 (*)    All other components within normal limits  CBC WITH DIFFERENTIAL/PLATELET - Abnormal; Notable for the following components:   Hemoglobin 12.4 (*)    HCT 37.2 (*)    Platelets 83 (*)    All other components within normal limits  URINALYSIS, W/ REFLEX TO CULTURE (INFECTION SUSPECTED) - Abnormal; Notable for the following components:   Color, Urine YELLOW (*)    APPearance CLEAR (*)    All other components within normal limits  LACTIC ACID, PLASMA - Abnormal; Notable for the following components:   Lactic Acid, Venous 2.2 (*)    All other components within normal limits  TROPONIN I (HIGH SENSITIVITY) - Abnormal; Notable for the following components:   Troponin I (High Sensitivity) 34 (*)    All other components within normal limits  TROPONIN I (HIGH SENSITIVITY) - Abnormal; Notable for the following components:   Troponin I (High Sensitivity) 30 (*)    All other components within normal limits  RESP PANEL BY RT-PCR (RSV, FLU A&B, COVID)   RVPGX2  RESP PANEL BY RT-PCR (RSV, FLU A&B, COVID)  RVPGX2  CULTURE, BLOOD (ROUTINE X 2)  CULTURE, BLOOD (ROUTINE X 2)  AMMONIA  MAGNESIUM   VALPROIC ACID  LEVEL  LACTIC ACID, PLASMA  LACTIC ACID, PLASMA  CBC WITH DIFFERENTIAL/PLATELET    MEDICATIONS ORDERED IN ED: Medications  OLANZapine  (ZYPREXA ) tablet 2.5 mg (2.5 mg Oral Given 10/31/23 0006)  OLANZapine  (  ZYPREXA ) injection 5 mg (5 mg Intramuscular Given 10/31/23 0242)  albuterol  (PROVENTIL ) (2.5 MG/3ML) 0.083% nebulizer solution 2.5 mg (has no administration in time range)  amLODipine  (NORVASC ) tablet 2.5 mg (2.5 mg Oral Given 10/31/23 1033)  aspirin  chewable tablet 81 mg (81 mg Oral Given 10/31/23 1033)  atorvastatin  (LIPITOR) tablet 20 mg (20 mg Oral Given 10/31/23 0005)  cyanocobalamin  (VITAMIN B12) injection 1,000 mcg (1,000 mcg Intramuscular Given 10/22/23 1107)  ferrous sulfate  tablet 325 mg (325 mg Oral Given 10/31/23 0752)  furosemide  (LASIX ) tablet 80 mg (80 mg Oral Given 10/31/23 1035)  levothyroxine  (SYNTHROID ) tablet 75 mcg (75 mcg Oral Given 10/31/23 1042)  losartan  (COZAAR ) tablet 50 mg (50 mg Oral Given 10/31/23 1046)  pantoprazole  (PROTONIX ) EC tablet 40 mg (40 mg Oral Given 10/31/23 1034)  OLANZapine  (ZYPREXA ) tablet 2.5 mg (2.5 mg Oral Given 10/31/23 0752)  sertraline  (ZOLOFT ) tablet 150 mg (150 mg Oral Given 10/31/23 1042)  acetaminophen  (TYLENOL ) tablet 650 mg (650 mg Oral Given 10/19/23 1629)  ziprasidone  (GEODON ) 20 MG injection (  Not Given 10/09/23 1409)  divalproex  (DEPAKOTE ) DR tablet 500 mg (500 mg Oral Given 10/31/23 1034)  potassium chloride  SA (KLOR-CON  M) CR tablet 40 mEq (40 mEq Oral Given 08/24/23 0205)  LORazepam  (ATIVAN ) tablet 1 mg (1 mg Oral Given 08/26/23 0247)  LORazepam  (ATIVAN ) tablet 1 mg (1 mg Oral Given 09/01/23 1611)  acetaminophen  (TYLENOL ) tablet 1,000 mg (1,000 mg Oral Given 09/02/23 0342)  cephALEXin  (KEFLEX ) capsule 500 mg (500 mg Oral Given 09/29/23 0819)  ziprasidone  (GEODON ) injection 20 mg (20 mg  Intramuscular Given 09/25/23 2115)  midazolam  PF (VERSED ) injection 2 mg (2 mg Nasal Given 09/26/23 0126)  midazolam  (VERSED ) injection 2 mg (2 mg Nasal Given 09/26/23 1455)  LORazepam  (ATIVAN ) tablet 2 mg (2 mg Oral Given 09/26/23 2020)  midazolam  (VERSED ) injection 2 mg (2 mg Intramuscular Given by Other 09/28/23 2110)  midazolam  (VERSED ) injection 2 mg (2 mg Intramuscular Not Given 09/29/23 0935)  midazolam  (VERSED ) injection 2 mg (2 mg Intramuscular Given 09/29/23 1803)  haloperidol  lactate (HALDOL ) injection 5 mg (5 mg Intramuscular Given 10/03/23 1948)  ciprofloxacin  (CIPRO ) tablet 500 mg (500 mg Oral Given 10/10/23 2008)  LORazepam  (ATIVAN ) injection 2 mg (2 mg Intramuscular Given 10/05/23 1739)  haloperidol  lactate (HALDOL ) injection 5 mg (5 mg Intramuscular Given 10/05/23 1739)  diphenhydrAMINE  (BENADRYL ) injection 50 mg (50 mg Intramuscular Given 10/05/23 1739)  lidocaine  (XYLOCAINE ) 2 % jelly 1 Application (1 Application Topical Given 10/08/23 0636)  midazolam  (VERSED ) injection 2 mg (2 mg Intramuscular Given 10/09/23 0241)  ziprasidone  (GEODON ) injection 10 mg (10 mg Intramuscular Given 10/09/23 1408)  LORazepam  (ATIVAN ) injection 1 mg (1 mg Intramuscular Given 10/10/23 1618)  midazolam  PF (VERSED ) injection 2 mg (2 mg Nasal Given 10/10/23 2150)  LORazepam  (ATIVAN ) injection 2 mg (2 mg Intramuscular Given 10/11/23 1635)  ziprasidone  (GEODON ) injection 10 mg (10 mg Intramuscular Given 10/12/23 0040)  midazolam  (VERSED ) injection 2 mg (2 mg Intramuscular Given by Other 10/13/23 0900)  LORazepam  (ATIVAN ) injection 1 mg (1 mg Intramuscular Given 10/17/23 2132)  OLANZapine  (ZYPREXA ) tablet 5 mg (5 mg Oral Given 10/20/23 1550)  LORazepam  (ATIVAN ) tablet 1 mg (1 mg Oral Given 10/22/23 0127)  lactated ringers  bolus 1,000 mL (0 mLs Intravenous Stopped 10/30/23 0243)     ED Plan   Currently awaiting placement into an appropriate living facility.  Social work is working with the patient to help achieve this.       Ruth Cove, MD 10/31/23 1255

## 2023-10-31 NOTE — ED Notes (Signed)
 Pt disposable brief dry at this time.

## 2023-10-31 NOTE — ED Notes (Signed)
 Pt noted to be scooting to side of bed, uncooperative with repositioning and increasing in verbal agitation, punching arms and grabbing staff.  Unable to verbally deescalate for safe repositioning.

## 2023-10-31 NOTE — ED Notes (Signed)
 Pt drinking coffee, calm and apologizing for his behavior.

## 2023-10-31 NOTE — ED Provider Notes (Signed)
 Emergency Medicine Observation Re-evaluation Note  Physical Exam   BP 124/78 (BP Location: Right Arm)   Pulse 63   Temp 98.6 F (37 C) (Oral)   Resp 18   Ht 6' (1.829 m)   Wt 97.5 kg   SpO2 94%   BMI 29.16 kg/m   Patient appears in no acute distress.  ED Course / MDM   No reported events during my shift at the time of this note.   Pt is awaiting dispo from consultants   Buell Carmin MD    Buell Carmin, MD 10/31/23 [phone]

## 2023-10-31 NOTE — ED Notes (Signed)
 Pt provided with pericare and clean, dry brief.  Cooperative at this time.

## 2023-10-31 NOTE — ED Notes (Signed)
 Pt noted to have both legs over side of bed.  Cooperative with repositioning assistance.  Water  given at this time per pt request.

## 2023-10-31 NOTE — ED Notes (Signed)
 Pt provided with peri care, barrier cream and clean dry brief applied.

## 2023-10-31 NOTE — ED Notes (Signed)
 Pt noted to be scooting to bottom of bed with legs off side of bed.  Assisted to reposition with 4 staff members.  Pt agitated, gritting teeth and verbally aggressive.  Pt disposable brief checked, dry at this time.  Snack and drink given post repositioning.  Pt currently eating snack calmly at this time.

## 2023-10-31 NOTE — ED Notes (Signed)
 Pt noted to be scooting to edge of bed.  Assisted pt to reposition in bed. Pt calm and cooperative.

## 2023-11-01 NOTE — ED Notes (Signed)
 Vol/toc.Marland Kitchen

## 2023-11-01 NOTE — ED Notes (Signed)
 Pt repositioned in bed after bed alarm went off for movement towards edge.

## 2023-11-01 NOTE — ED Notes (Signed)
 Pt repositioned in bed. Brief clean and dry at this time.

## 2023-11-01 NOTE — TOC Progression Note (Signed)
 Transition of Care [facility]) - Progression Note    Patient Details  Name: Hunter [name] MRN: [id number] Date of Birth: February 21, 1934  Transition of Care [facility]) CM/SW Contact  Elmira Haddock, LCSW Phone Number: 11/01/2023, 3:50 PM  Clinical Narrative:    CSW followed up with Katherina Pan.  He came to see patient today an he states that he is declining patient.  TOC to continue to follow.    Expected Discharge Plan:  (TBD) Barriers to Discharge: ED Patient/Family Requesting Placement but Has No Payor  Expected Discharge Plan and Services     Post Acute Care Choice:  (TBD) Living arrangements for the past 2 months: Skilled Nursing Facility                                       Social Determinants of Health (SDOH) Interventions SDOH Screenings   Food Insecurity: No Food Insecurity (06/13/2023)   Received from [facility]  Housing: Low Risk  (06/09/2023)  Transportation Needs: No Transportation Needs (06/13/2023)   Received from [facility]  Utilities: Low Risk  (06/13/2023)   Received from [facility]  Alcohol Screen: Low Risk  (07/06/2021)  Depression (PHQ2-9): Medium Risk (11/26/2022)  Financial Resource Strain: Low Risk  (06/13/2023)   Received from [facility]  Physical Activity: Insufficiently Active (06/13/2023)   Received from [facility]  Social Connections: Moderately Integrated (06/13/2023)   Received from [facility]  Stress: No Stress Concern Present (06/13/2023)   Received from [facility]  Tobacco Use: Low Risk  (08/23/2023)  Recent Concern: Tobacco Use - Medium Risk (06/14/2023)   Received from [facility] Literacy: Medium Risk (06/13/2023)   Received from [facility]    Readmission Risk Interventions     No data to display

## 2023-11-01 NOTE — Progress Notes (Signed)
 Occupational Therapy Treatment Patient Details Name: Hunter [name] MRN: [id number] DOB: 1934/06/29 Today's Date: 11/01/2023   History of present illness Pt is an 89 y/o M admitted on 08/24/23 under IVC for aggression with staff & behavioral challenges (per psych, due to baseline dementia) at his facility. PMH: chronic LBP, CKD 3, essential HTN, HLD, L BBB, major depression, neuropathy, PVD.   OT comments  Pt seen for OT intervention this date, goals downgraded to reflect current progression. Pt continues to fluctuate with levels of assist required due to baseline dementia. Pt pleasant, calm and cooperative during OT sessions.  Requires skilled +2 due to global weakness and inability to consistently follow commands to motor plan task performance. Session focused on introduction of Stedy device to facilitate safe transfers and UB/LB bathing/pericare routine with oral care performed sink level. Pt able to comprehend directions to scoot hips forward in recliner, placing feet/hands in [place] as directed by this author with max multimodal cuing, MAX A +2 to shift weight anteriorly to stand. Tolerates standing ~2 mins while pericare performed, MAX A to perform posterior pericare with pt able to initiate LB bathing once returned to seated. Oral care at sink with MIN A, pt able to place toothpaste on brush but requires cues for sequencing to rinse mouth and wash face. Attempted to perform final stand to transition back to EOB, pt unable to comprehend directions and presents with posterior lean/push. MAX A +2 to laterally shift weight to shift back flaps out of the way. Pt provided with warm blankets, drink and snack once returned to bed. OT will continue to progress as able. Discharge recommendation remains appropriate.         If plan is discharge home, recommend the following:  Supervision due to cognitive status;Assist for transportation;Assistance with cooking/housework;Help with stairs or ramp for  entrance;Two people to help with walking and/or transfers;Direct supervision/assist for financial management;Direct supervision/assist for medications management;Two people to help with bathing/dressing/bathroom   Equipment Recommendations  Other (comment)       Precautions / Restrictions Precautions Precautions: Fall Recall of Precautions/Restrictions: Impaired Precaution/Restrictions Comments: hx of restlessness, agitation and aggression. heavy posterior lean. Restrictions Weight Bearing Restrictions Per Provider Order: No       Mobility Bed Mobility Overal bed mobility: Needs Assistance         Sit to supine: Max assist, +2 for safety/equipment   General bed mobility comments: cues for task segmentation and sequencing, total A to scoot up in bed    Transfers Overall transfer level: Needs assistance Equipment used: Ambulation equipment used Transfers: Sit to/from Stand Sit to Stand: Max assist, +2 physical assistance           General transfer comment: Trial of Stedy due to significant mobility decline with heavy posterior lean past 1-2 sessons. Pt motivated to attempt, scoots hips forward in recliner and places feet/hands in stedy with cues from therapist. Able to pull self up to standing with MAX A +2 for hip pads to be placed behind. Stands 2x times with MAX A +2 for new brief to be placed, and for oral care at sink. When attempting to transfer back to bed, pt unable to motor plan correctly, does not comprehend directions, and begins pushing posteriorly. unable to truely stand to offset buttocks from steady, maxA to laterally lean to clear pads and transition back to seated. pt calm and cooperative with good frusteration tolerance. Transfer via Lift Equipment: Stedy   Balance Overall balance assessment: Needs assistance Sitting-balance support:  Feet supported, No upper extremity supported Sitting balance-Leahy Scale: Poor Sitting balance - Comments: persistent  posterior lean, limited awareness   Standing balance support: Bilateral upper extremity supported, During functional activity, Reliant on assistive device for balance Standing balance-Leahy Scale: Zero Standing balance comment: used stedy. pt unable to truly stand.                           ADL either performed or assessed with clinical judgement   ADL Overall ADL's : Needs assistance/impaired Eating/Feeding: Sitting;Set up   Grooming: Oral care;Sitting;Wash/dry face;Wash/dry hands;Minimal assistance (in stedy) Grooming Details (indicate cue type and reason): minA, in stedy at sink to perform oral care. pt able to place toothpaste on toothbrush, and brushes teeth with cues to lean forward to spit         Upper Body Dressing : Sitting;Minimal assistance Upper Body Dressing Details (indicate cue type and reason): in stedy, improved initiation and able to thread arms through Lower Body Dressing: Maximal assistance;Sit to/from stand Lower Body Dressing Details (indicate cue type and reason): in stedy     Toileting- Clothing Manipulation and Hygiene: Maximal assistance;+2 for physical assistance;Sit to/from stand;Minimal assistance Toileting - Clothing Manipulation Details (indicate cue type and reason): maxA to doff soiled brief, pt able to perform pericare seated in stedy with minA for thoroughness. maxA for posterior pericare     Functional mobility during ADLs: +2 for physical assistance;Rolling walker (2 wheels);Cueing for sequencing;Cueing for safety;Maximal assistance       Communication Communication Communication: Impaired Factors Affecting Communication: Hearing impaired   Cognition Arousal: Alert Behavior During Therapy: WFL for tasks assessed/performed Cognition: History of cognitive impairments, Cognition impaired   Orientation impairments: Place, Time, Situation Awareness: Intellectual awareness impaired Memory impairment (select all impairments):  Short-term memory, Working [employer] fast streamer, Non-declarative long-term memory, Geneticist, molecular long-term memory Attention impairment (select first level of impairment): Focused attention Executive functioning impairment (select all impairments): Initiation, Organization, Sequencing, Reasoning, Problem solving OT - Cognition Comments: pleasant and cooperative. better ability to follow commands this session. increased difficulties motor planning and heavy posterior lean with forward weight shift.                 Following commands: Impaired        Cueing   Cueing Techniques: Verbal cues, Tactile cues, Gestural cues             Pertinent Vitals/ Pain       Pain Assessment Pain Assessment: No/denies pain         Frequency  Min 2X/week        Progress Toward Goals  OT Goals(current goals can now be found in the care plan section)  Progress towards OT goals: Goals drowngraded-see care plan (Pt flucuating in progression, potentially due to advancing dementia)  Acute Rehab OT Goals OT Goal Formulation: Patient unable to participate in goal setting Time For Goal Achievement: 12/16/23 Potential to Achieve Goals: Fair ADL Goals Pt Will Perform Grooming: with supervision;standing;sitting Pt Will Perform Upper Body Bathing: sitting;with set-up Pt Will Perform Lower Body Bathing: with min assist;sit to/from stand Pt Will Perform Upper Body Dressing: with min assist;sitting Pt Will Perform Lower Body Dressing: with min assist;sit to/from stand Pt Will Transfer to Toilet: with +2 assist;squat pivot transfer;with mod assist Pt Will Perform Toileting - Clothing Manipulation and hygiene: with max assist;sitting/lateral leans;bed level  Plan      Co-evaluation  AM-PAC OT "6 Clicks" Daily Activity     Outcome Measure   Help from another person eating meals?: None Help from another person taking care of personal grooming?: A Little Help from another person toileting,  which includes using toliet, bedpan, or urinal?: Total Help from another person bathing (including washing, rinsing, drying)?: A Lot Help from another person to put on and taking off regular upper body clothing?: A Little Help from another person to put on and taking off regular lower body clothing?: Total 6 Click Score: 14    End of Session Equipment Utilized During Treatment:  Octaviano Belts)  OT Visit Diagnosis: Unsteadiness on feet (R26.81);Repeated falls (R29.6);Muscle weakness (generalized) (M62.81)   Activity Tolerance Patient tolerated treatment well   Patient Left in bed;with bed alarm set;with call bell/phone within reach   Nurse Communication Mobility status        Time: 8469-6295 OT Time Calculation (min): 28 min  Charges: OT General Charges $OT Visit: 1 Visit OT Treatments $Self Care/Home Management : 23-37 mins  Bronda Alfred L. Lavaughn Bisig, OTR/L  11/01/23, 4:30 PM

## 2023-11-01 NOTE — ED Provider Notes (Signed)
-----------------------------------------   4:58 AM on 11/01/2023 -----------------------------------------   Blood pressure 112/69, pulse 68, temperature 98 F (36.7 C), temperature source Oral, resp. rate 18, height 6' (1.829 m), weight 97.5 kg, SpO2 95%.  The patient is calm and cooperative at this time.  There have been no acute events since the last update.  Awaiting disposition plan from case management/social work.    Glynna Failla, [name], DO 11/01/23 [phone]

## 2023-11-01 NOTE — ED Notes (Signed)
 Pt given snack.

## 2023-11-01 NOTE — ED Notes (Signed)
 Pt was attempting to get out of bed.  RN and nurse tech changed pt linens, brief, pad, and performed peri care.  RN provided pt a coke.  Pt currently lying in bed.

## 2023-11-01 NOTE — ED Provider Notes (Signed)
 Emergency Medicine Observation Re-evaluation Note  Hunter [name] is a 89 y.o. male, seen on rounds today.  Pt initially presented to the ED for complaints of TOC Placement Currently, the patient is in no acute distress.  Physical Exam  BP 135/65   Pulse 63   Temp 97.8 F (36.6 C) (Oral)   Resp 18   Ht 6' (1.829 m)   Wt 97.5 kg   SpO2 100%   BMI 29.16 kg/m  Physical Exam General: No acute distress \  ED Course / MDM  EKG:EKG Interpretation Date/Time:  Friday October 08 2023 12:08:40 EDT Ventricular Rate:  65 PR Interval:  186 QRS Duration:  190 QT Interval:  506 QTC Calculation: 526 R Axis:   -73  Text Interpretation: Ventricular-paced rhythm with premature ventricular or aberrantly conducted complexes Abnormal ECG When compared with ECG of 04-Oct-2023 12:02, Premature ventricular complexes are no longer Present Vent. rate has decreased BY   7 BPM Confirmed by UNCONFIRMED, DOCTOR ([id number]), editor Toy Freund, Tammy [id number]) on 10/11/2023 2:40:51 PM  I have reviewed the labs performed to date as well as medications administered while in observation.  No acute events on recent shift nor my own.  Plan  Current plan is for [facility] placement recommendations.    Collis Deaner, MD 11/01/23 [phone]

## 2023-11-01 NOTE — ED Notes (Signed)
 Pt provided with dinner tray and beverage at this time.

## 2023-11-02 NOTE — ED Notes (Signed)
 Dinner tray left at pt bedside. Pt sleeping at this time.

## 2023-11-02 NOTE — Progress Notes (Signed)
 Physical Therapy Treatment Patient Details Name: Hunter [name] MRN: [id number] DOB: 1933/11/16 Today's Date: 11/02/2023   History of Present Illness Pt is an 89 y/o M admitted on 08/24/23 under IVC for aggression with staff & behavioral challenges (per psych, due to baseline dementia) at his facility. PMH: chronic LBP, CKD 3, essential HTN, HLD, L BBB, major depression, neuropathy, PVD.    PT Comments  Patient sitting EOB with RN present on arrival. Patient required maxA+2 to stand from EOB with RW with patient demonstrating significant posterior lean. With modA+2, patient able to take sidesteps towards [facility] with RW. Returned self to bed with minA to reposition shoulders to middle of bed. Pleasantly confused throughout session. Discharge plan remains appropriate.     If plan is discharge home, recommend the following: Assistance with cooking/housework;Direct supervision/assist for medications management;Assist for transportation;Help with stairs or ramp for entrance;Supervision due to cognitive status;A lot of help with walking and/or transfers;A lot of help with bathing/dressing/bathroom   Can travel by private vehicle     No  Equipment Recommendations  Other (comment) (defer to next venue)    Recommendations for Other Services       Precautions / Restrictions Precautions Precautions: Fall Recall of Precautions/Restrictions: Impaired Precaution/Restrictions Comments: hx of restlessness, agitation and aggression. heavy posterior lean. Restrictions Weight Bearing Restrictions Per Provider Order: No     Mobility  Bed Mobility Overal bed mobility: Needs Assistance Bed Mobility: Sit to Supine       Sit to supine: Min assist   General bed mobility comments: assist to reposition shoulders towards middle of bed    Transfers Overall transfer level: Needs assistance Equipment used: Rolling Tarini Carrier (2 wheels) Transfers: Sit to/from Stand Sit to Stand: Max assist, +2 physical  assistance, +2 safety/equipment           General transfer comment: requires heavy +2 assist to come into standing with significant posterior lean    Ambulation/Gait Ambulation/Gait assistance: Mod assist, +2 physical assistance, +2 safety/equipment Gait Distance (Feet): 4 Feet Assistive device: Rolling Preslee Regas (2 wheels)   Gait velocity: decreased     General Gait Details: sidestepping towards HOB with modA+2 for RW management and balance   Stairs             Wheelchair Mobility     Tilt Bed    Modified Rankin (Stroke Patients Only)       Balance Overall balance assessment: Needs assistance Sitting-balance support: Feet supported, No upper extremity supported Sitting balance-Leahy Scale: Fair     Standing balance support: Bilateral upper extremity supported, During functional activity, Reliant on assistive device for balance Standing balance-Leahy Scale: Poor                              Communication Communication Communication: Impaired Factors Affecting Communication: Hearing impaired  Cognition Arousal: Alert Behavior During Therapy: WFL for tasks assessed/performed   PT - Cognitive impairments: History of cognitive impairments, Safety/Judgement, Problem solving, Sequencing, Initiation, Awareness, Orientation                         Following commands: Impaired      Cueing    Exercises      General Comments        Pertinent Vitals/Pain Pain Assessment Pain Assessment: No/denies pain    Home Living  Prior Function            PT Goals (current goals can now be found in the care plan section) Acute Rehab PT Goals Patient Stated Goal: none stated PT Goal Formulation: With patient Time For Goal Achievement: 11/24/23 Potential to Achieve Goals: Fair Progress towards PT goals: Progressing toward goals    Frequency    Min 1X/week      PT Plan      Co-evaluation               AM-PAC PT "6 Clicks" Mobility   Outcome Measure  Help needed turning from your back to your side while in a flat bed without using bedrails?: A Lot Help needed moving from lying on your back to sitting on the side of a flat bed without using bedrails?: A Lot Help needed moving to and from a bed to a chair (including a wheelchair)?: Total Help needed standing up from a chair using your arms (e.g., wheelchair or bedside chair)?: Total Help needed to walk in hospital room?: Total Help needed climbing 3-5 steps with a railing? : Total 6 Click Score: 8    End of Session Equipment Utilized During Treatment: Gait belt Activity Tolerance: Patient tolerated treatment well Patient left: in bed;with call bell/phone within reach;with bed alarm set Nurse Communication: Mobility status PT Visit Diagnosis: Unsteadiness on feet (R26.81);Muscle weakness (generalized) (M62.81);Other abnormalities of gait and mobility (R26.89);Difficulty in walking, not elsewhere classified (R26.2);History of falling (Z91.81)     Time: 1610-9604 PT Time Calculation (min) (ACUTE ONLY): 14 min  Charges:    $Therapeutic Activity: 8-22 mins PT General Charges $$ ACUTE PT VISIT: 1 Visit                     Janine Melbourne, PT, DPT Physical Therapist - [facility]  [facility]    Mitsuko Luera A Azalya Galyon 11/02/2023, 12:53 PM

## 2023-11-02 NOTE — ED Notes (Signed)
 Pt cleaned up and bathed by Shelagh Derrick, NT.

## 2023-11-02 NOTE — ED Notes (Signed)
 With the help of PT Alayna pt stood with the help of staff and a walker. Pt did not have the strength to ambulate to the sink in the room to brush their teeth, and was only able to take 5 side steps to get themselves closer to the top of the bed and get back in bed. Pt's bed alarm was turned on and pt was made comfortable.

## 2023-11-02 NOTE — ED Notes (Signed)
 Pt ate roughly 60% of his dinner tray. Pt then provided with ice cream and proceeded to eat all of it. Pt ABCs intact. RR even and unlabored. Pt in NAD. Bed in lowest locked position with bed alarms set. Call bell in reach. Denies needs at this time.

## 2023-11-02 NOTE — ED Notes (Signed)
 Pt ABCs intact. RR even and unlabored. Pt in NAD. Bed in lowest locked position with alarms set.

## 2023-11-02 NOTE — ED Provider Notes (Signed)
-----------------------------------------   4:37 AM on 11/02/2023 -----------------------------------------   Blood pressure (!) 108/51, pulse 60, temperature 98.5 F (36.9 C), resp. rate 16, height 6' (1.829 m), weight 97.5 kg, SpO2 95%.  The patient is calm and cooperative at this time.  There have been no acute events since the last update.  Awaiting disposition plan from case management/social work.    Peggyann Zwiefelhofer, [name], DO 11/02/23 [phone]

## 2023-11-02 NOTE — ED Notes (Signed)
 Pt trying to get out of bed, this RN heard bed alarm as his primary RN was busy with another urgent Pt. No event, music turned of for Pt.

## 2023-11-02 NOTE — ED Notes (Signed)
 Pt ABCs intact. RR even and unlabored. Pt in NAD. Bed in lowest locked position.

## 2023-11-02 NOTE — ED Notes (Signed)
 Pt repositioned in bed after being walked to toilet with walker and secondary RN. Pt able to have a bowel movement and was cleaned up, provided with new linens and padding. Pt ABCs intact. RR even and unlabored. Pt in NAD. Bed in lowest locked position. Call bell in reach. Denies needs at this time.

## 2023-11-02 NOTE — ED Notes (Signed)
 Pt becoming restless and frequently moving towards foot of bed, after being redirected back into bed.

## 2023-11-02 NOTE — ED Notes (Signed)
 Pt consistently moving to edge of bed. Only minor agitation noted during pt interaction but pt apologized and reduced anger. Pt ABCs intact. RR even and unlabored. Pt in NAD. Bed in lowest locked position with bed alarms set. Call bell in reach. Denies needs at this time.

## 2023-11-02 NOTE — ED Notes (Signed)
 Pt moved back to bed after pt had scooted locked recliner to call bell button and pushed alarm. Pt tucked into bed, pillow fluffed, and bed alarm set.

## 2023-11-02 NOTE — ED Notes (Signed)
 Pt resting in recliner comfortably at this time. Bed alarm set.

## 2023-11-02 NOTE — ED Notes (Signed)
 Pt fed by this tech. Pt ate 100% of food and drank of fluid.

## 2023-11-02 NOTE — ED Notes (Signed)
 Pt moved himself from bed to recliner with minor assistance from secondary RN.

## 2023-11-02 NOTE — ED Notes (Signed)
 Vol/toc.Marland Kitchen

## 2023-11-02 NOTE — ED Notes (Addendum)
 After attempting to get out of bed again, pt requested some coffee. Pt brought warm cup of coffee with sugar and cream, prepared to satisfaction by this RN. Pt boosted in bed and sat up in bed to watch tv. Table across lab for ease of reach to drink coffee. Pt happy and enjoying Teaching laboratory technician. Pt ABCs intact. RR even and unlabored. Pt in NAD. Bed in lowest locked position with bed exit alarm on. Call bell in reach. Denies further needs at this time.

## 2023-11-02 NOTE — ED Notes (Signed)
 EDT Shelagh Derrick gave pt a bed bath, shaved his face, applied new sheets, chuck pad, brief, mepilex, lotion and clothes to the pt. This RN washed the pt's hair and gave him a haircut. Pt was repositioned in the bed and bed alarm was verified to be on and bed is in the lowest locked position.

## 2023-11-03 NOTE — TOC Progression Note (Signed)
 Transition of Care [facility]) - Progression Note    Patient Details  Name: Hunter [name] MRN: [id number] Date of Birth: 16-Jun-1934  Transition of Care [facility]) CM/SW Contact  [name], RN Phone Number: 11/03/2023, 1:50 PM  Clinical Narrative:    Patient has been difficult to place due to his aggressive behaviors. Chart review done to ensure we're looking for correct placement as it was initially inpt psych, then changed to SNF. Searching appropriate group homes that are able to meet his needs. TOC to continue working towards DC   Expected Discharge Plan:  (TBD) Barriers to Discharge: ED Patient/Family Requesting Placement but Has No Payor  Expected Discharge Plan and Services     Post Acute Care Choice:  (TBD) Living arrangements for the past 2 months: Skilled Nursing Facility                                       Social Determinants of Health (SDOH) Interventions SDOH Screenings   Food Insecurity: No Food Insecurity (06/13/2023)   Received from [facility]  Housing: Low Risk  (06/09/2023)  Transportation Needs: No Transportation Needs (06/13/2023)   Received from [facility]  Utilities: Low Risk  (06/13/2023)   Received from [facility]  Alcohol Screen: Low Risk  (07/06/2021)  Depression (PHQ2-9): Medium Risk (11/26/2022)  Financial Resource Strain: Low Risk  (06/13/2023)   Received from [facility]  Physical Activity: Insufficiently Active (06/13/2023)   Received from [facility]  Social Connections: Moderately Integrated (06/13/2023)   Received from [facility]  Stress: No Stress Concern Present (06/13/2023)   Received from [facility]  Tobacco Use: Low Risk  (08/23/2023)  Recent Concern: Tobacco Use - Medium Risk (06/14/2023)   Received from [facility] Literacy: Medium Risk (06/13/2023)   Received from [facility]    Readmission Risk Interventions     No data to display

## 2023-11-03 NOTE — ED Notes (Signed)
 Pt found to be out of bed and ambulated the few steps to the sink in the room. Pt's bed alarm noted to not be working properly. Pt was standing upright at the sink and holding himself up on the sink. Pt was assisted sitting onto the toilet.   Pt's ben linen was changed as well as pt's brief. Pt assisted ambulating back to the bed. Bed alarm reset.

## 2023-11-03 NOTE — ED Notes (Signed)
 Pt breakfast was provided w/milk at bedside

## 2023-11-03 NOTE — ED Provider Notes (Signed)
-----------------------------------------   6:16 AM on 11/03/2023 -----------------------------------------   Blood pressure (!) 136/53, pulse 77, temperature 97.8 F (36.6 C), temperature source Axillary, resp. rate 16, height 6' (1.829 m), weight 97.5 kg, SpO2 100%.  The patient is calm and cooperative at this time.  There have been no acute events since the last update.  Awaiting disposition plan from Social Work team.   Mariadejesus Cade J, MD 11/03/23 [phone]

## 2023-11-04 LAB — CULTURE, BLOOD (ROUTINE X 2)
Culture: NO GROWTH
Culture: NO GROWTH
Special Requests: ADEQUATE
Special Requests: ADEQUATE

## 2023-11-04 NOTE — ED Notes (Signed)
 Patient assisted to bedside commode with a walker and 2 person assist.

## 2023-11-04 NOTE — ED Notes (Signed)
 Patient resting with eyes closed.  RR even and unlabored.  Patient is easily aroused with verbal stimuli.  No needs voiced at this time.  No acute distress noted.

## 2023-11-04 NOTE — ED Notes (Signed)
 Pt bed alarm sounding. Pt assisted to the recliner by EDT. Pt watching TV

## 2023-11-04 NOTE — ED Provider Notes (Signed)
-----------------------------------------   5:55 AM on 11/04/2023 -----------------------------------------   Blood pressure 130/68, pulse 70, temperature 98.1 F (36.7 C), temperature source Oral, resp. rate 18, height 6' (1.829 m), weight 97.5 kg, SpO2 100%.  The patient is calm and cooperative at this time.  There have been no acute events since the last update.  Awaiting disposition plan from Social Work team.   Makesha Belitz J, MD 11/04/23 [phone]

## 2023-11-04 NOTE — Progress Notes (Signed)
 Physical Therapy Treatment Patient Details Name: Hunter [name] MRN: [id number] DOB: 12-01-1933 Today's Date: 11/04/2023   History of Present Illness Pt is an 89 y/o M admitted on 08/24/23 under IVC for aggression with staff & behavioral challenges (per psych, due to baseline dementia) at his facility. PMH: chronic LBP, CKD 3, essential HTN, HLD, L BBB, major depression, neuropathy, PVD.    PT Comments  Author responded to [name] secure chat about pt's appropriateness for group home placement. He is current not. Pt is severely incontinent and disoriented x 3.Only truly oriented to self. Pleasantly confused through this session and remain extremely HOH. He is unable to perform safe ADL care/hygiene. Will need 24/7 assistance and care due to this. Pt was agreeable to OOB activity. He performing exiting R side of bed, standing EOB 4 x prior to ambulating to doorway of room. Short seated rest in recliner prior to ambulating back to EOB. Pt remain high fall risk. Acute PT will continue to follow and progress per current POC.     If plan is discharge home, recommend the following: Assistance with cooking/housework;Direct supervision/assist for medications management;Assist for transportation;Help with stairs or ramp for entrance;Supervision due to cognitive status;A lot of help with walking and/or transfers;A lot of help with bathing/dressing/bathroom     Equipment Recommendations  Rolling walker (2 wheels);Wheelchair (measurements PT);Wheelchair cushion (measurements PT);BSC/3in1;Hospital bed       Precautions / Restrictions Precautions Precautions: Fall Recall of Precautions/Restrictions: Impaired Precaution/Restrictions Comments: hx of restlessness, agitation and aggression. heavy posterior lean however cooperative, pleasant, and no Restrictions Weight Bearing Restrictions Per Provider Order: No     Mobility  Bed Mobility Overal bed mobility: Needs Assistance Bed Mobility: Supine to Sit, Sit to  Supine  Supine to sit: Min assist Sit to supine: Min assist General bed mobility comments: Pt was able to exit R side of bed with min assist. Min assist after ambulation to return to bed from EOB sitting    Transfers Overall transfer level: Needs assistance Equipment used: Rolling walker (2 wheels) Transfers: Sit to/from Stand Sit to Stand: Mod assist, From elevated surface  General transfer comment: Mod assist to stand 4 x EOB while authro assisted with changing linens and depends. Pt able static stand with CGA only.    Ambulation/Gait Ambulation/Gait assistance: Min assist Gait Distance (Feet): 10 Feet Assistive device: Rolling walker (2 wheels) Gait Pattern/deviations: Trunk flexed, Knee flexed in stance - right, Knee flexed in stance - left Gait velocity: decreased  General Gait Details: Pt continues to present with flexed knees with ll standing posture and slight hip flexion. max vcs for posture correction however pt unable to correct. ambulated 10 ft, then 8 ft with author following with a chair for additional safety    Balance Overall balance assessment: Needs assistance Sitting-balance support: Feet supported, No upper extremity supported Sitting balance-Leahy Scale: Fair     Standing balance support: Bilateral upper extremity supported, During functional activity Standing balance-Leahy Scale: Poor Standing balance comment: pt is at high risk of falls mostly due to cognition and poor safety awareness. was able to static stand with CGA x ~ 4 minutes      Communication Communication Communication: Impaired Factors Affecting Communication: Hearing impaired  Cognition Arousal: Alert Behavior During Therapy: Flat affect   PT - Cognitive impairments: History of cognitive impairments   Orientation impairments: Place, Time, Situation      PT - Cognition Comments: Pt is severely HOH. Asleep upon arrival with bed linens and depend soak  from incontinence of urine Following  commands: Impaired      Cueing Cueing Techniques: Verbal cues, Tactile cues   Pertinent Vitals/Pain Pain Assessment Pain Assessment: No/denies pain     PT Goals (current goals can now be found in the care plan section) Acute Rehab PT Goals Patient Stated Goal: none stated Progress towards PT goals: Not progressing toward goals - comment    Frequency    Min 1X/week           Co-evaluation     PT goals addressed during session: Mobility/safety with mobility;Balance;Proper use of DME;Strengthening/ROM        AM-PAC PT "6 Clicks" Mobility   Outcome Measure  Help needed turning from your back to your side while in a flat bed without using bedrails?: A Little Help needed moving from lying on your back to sitting on the side of a flat bed without using bedrails?: A Little Help needed moving to and from a bed to a chair (including a wheelchair)?: A Lot Help needed standing up from a chair using your arms (e.g., wheelchair or bedside chair)?: A Lot Help needed to walk in hospital room?: A Lot Help needed climbing 3-5 steps with a railing? : Total 6 Click Score: 13    End of Session   Activity Tolerance: Patient tolerated treatment well;Patient limited by fatigue Patient left: in bed;with call bell/phone within reach;with bed alarm set Nurse Communication: Mobility status PT Visit Diagnosis: Unsteadiness on feet (R26.81);Muscle weakness (generalized) (M62.81);Other abnormalities of gait and mobility (R26.89);Difficulty in walking, not elsewhere classified (R26.2);History of falling (Z91.81)     Time: 1914-7829 PT Time Calculation (min) (ACUTE ONLY): 15 min  Charges:    $Gait Training: 8-22 mins PT General Charges $$ ACUTE PT VISIT: 1 Visit                    Chester Costa PTA 11/04/23, 4:46 PM

## 2023-11-04 NOTE — ED Notes (Addendum)
 RN at bedside due to recliner chair alarm sounding. Pt attempting to get out of recliner. Pt with visible soiled brief and chux pad. Pt assisted to bed with 2 staff assist. Pt very unsteady. Brief changed, pt cleaned. Lights dimmed for comfort. Bed alarm activated.

## 2023-11-04 NOTE — ED Notes (Signed)
 Pt provided milk and snack while waiting for breakfast

## 2023-11-04 NOTE — ED Notes (Signed)
 Pt expressed being wet. Brief, bed pad, blankets and gown changed at this time

## 2023-11-04 NOTE — ED Notes (Signed)
 Pt attempting to get out of bed. Pt's brief and bed were soiled with urine. Pt's bed and gown chained, peri care provided. Pt given drink at pt's request. Pt pulled up in the bed with no other needs at this time.

## 2023-11-04 NOTE — TOC Progression Note (Signed)
 Transition of Care [facility]) - Progression Note    Patient Details  Name: Hunter [name] MRN: [id number] Date of Birth: Dec 22, 1933  Transition of Care [facility] CM/SW Contact  [name], RN Phone Number: 11/04/2023, 12:34 PM  Clinical Narrative:    No SNF is accepting Montel due to his aggressive behaviors. Message sent to PT and his RN to see if we can change him to home with [facility] and try to get him into a group home setting instead. TOC to continue to follow   Expected Discharge Plan:  (TBD) Barriers to Discharge: ED Patient/Family Requesting Placement but Has No Payor  Expected Discharge Plan and Services     Post Acute Care Choice:  (TBD) Living arrangements for the past 2 months: Skilled Nursing Facility                                       Social Determinants of Health (SDOH) Interventions SDOH Screenings   Food Insecurity: No Food Insecurity (06/13/2023)   Received from [facility]  Housing: Low Risk  (06/09/2023)  Transportation Needs: No Transportation Needs (06/13/2023)   [facility]  Utilities: Low Risk  (06/13/2023)   Received from [facility]  Alcohol Screen: Low Risk  (07/06/2021)  Depression (PHQ2-9): Medium Risk (11/26/2022)  Financial Resource Strain: Low Risk  (06/13/2023)   Received from [facility]  Physical Activity: Insufficiently Active (06/13/2023)   Received from [facility]  Social Connections: Moderately Integrated (06/13/2023)   Received from [facility]  Stress: No Stress Concern Present (06/13/2023)   Received from [facility]  Tobacco Use: Low Risk  (08/23/2023)  Recent Concern: Tobacco Use - Medium Risk (06/14/2023)   Received from [facility] Literacy: Medium Risk (06/13/2023)   Received from [facility]    Readmission Risk Interventions     No data to display

## 2023-11-05 NOTE — ED Notes (Signed)
 Patient brief and bedding changed.  Gown changed and patient put on deodorant.

## 2023-11-05 NOTE — ED Notes (Signed)
Gave pt cup of water 

## 2023-11-05 NOTE — ED Notes (Signed)
 Provided pt dinner tray on side table. Pt was sleeping at this time.

## 2023-11-05 NOTE — ED Provider Notes (Signed)
 Emergency Medicine Observation Re-evaluation Note  Hunter [name] is a 89 y.o. male, seen on rounds today.  Pt initially presented to the ED for complaints of TOC Placement  Currently, the patient is resting comfortably.  Physical Exam  BP (!) 144/108   Pulse 83   Temp 97.8 F (36.6 C) (Oral)   Resp 16   Ht 6' (1.829 m)   Wt 97.5 kg   SpO2 98%   BMI 29.16 kg/m  General: No acute distress Cardiac: Well-perfused extremities Lungs: No respiratory distress Psych: Appropriate mood and affect  ED Course / MDM  EKG:EKG Interpretation Date/Time:  Friday October 08 2023 12:08:40 EDT Ventricular Rate:  65 PR Interval:  186 QRS Duration:  190 QT Interval:  506 QTC Calculation: 526 R Axis:   -73  Text Interpretation: Ventricular-paced rhythm with premature ventricular or aberrantly conducted complexes Abnormal ECG When compared with ECG of 04-Oct-2023 12:02, Premature ventricular complexes are no longer Present Vent. rate has decreased BY   7 BPM Confirmed by UNCONFIRMED, DOCTOR ([id number]), editor Toy Freund, Tammy [phone]) on 10/11/2023 2:40:51 PM  I have reviewed the labs performed to date as well as medications administered while in observation.  Recent changes in the last 24 hours include none.  Plan  Current plan is for placement.   Dodge Ator K, MD 11/05/23 [phone]

## 2023-11-05 NOTE — ED Notes (Signed)
 Assisted RN with changing pt brief and gown and pulling pt up in the bed.

## 2023-11-05 NOTE — ED Notes (Signed)
 Toothbrush and toothpaste given to patient per his request for oral hygiene.

## 2023-11-05 NOTE — ED Notes (Signed)
 Patient up to recliner chair with Tech assist.  He is calm and cooperative, watching TV.  No needs voiced.  No acute distress noted.

## 2023-11-06 NOTE — ED Notes (Signed)
 During patient rounds, patient's bed alarm went off. This NT went in to check on patient. Patient's bed was saturated with urine. This NT gave patient a shower. Nurse Kayleen Party and NT Melissa cleaned patient's bed and floor. Patient is back in bed resting comfortably at this time.

## 2023-11-06 NOTE — ED Notes (Signed)
 Transferred patient to recliner with staff assistance. Patient sat up in recliner to eat breakfast.

## 2023-11-06 NOTE — ED Notes (Signed)
 Transferred patient from recliner back into bed for improved comfort. Patient's brief was also changed.

## 2023-11-06 NOTE — ED Provider Notes (Signed)
 Emergency Medicine Observation Re-evaluation Note  Physical Exam   BP (!) 144/108   Pulse 83   Temp 97.8 F (36.6 C) (Oral)   Resp 16   Ht 6' (1.829 m)   Wt 97.5 kg   SpO2 98%   BMI 29.16 kg/m   Patient appears in no acute distress.  ED Course / MDM   No reported events during my shift at the time of this note.   Pt is awaiting dispo from consultants   Buell Carmin MD    Buell Carmin, MD 11/06/23 0200

## 2023-11-06 NOTE — ED Notes (Addendum)
 Patient shirt changed, Bed Linen changed. Patient washed face. Brushed teeth. Brushed hair. Patient feet up in recliner with pillow behind head. Warm blankets and Cola [place] provided. Patient ate 100% of breakfast and intake was 240.

## 2023-11-06 NOTE — ED Notes (Signed)
Patient is vol pending placement 

## 2023-11-06 NOTE — ED Notes (Signed)
 2 RNs repositioned patient in bed. Patient stated he would like to eat dinner in bed. Dinner tray provided to patient.

## 2023-11-07 NOTE — ED Notes (Signed)
Pt given coke to drink 

## 2023-11-07 NOTE — ED Provider Notes (Signed)
 Emergency Medicine Observation Re-evaluation Note  Physical Exam   BP (!) 126/48   Pulse 70   Temp 98.2 F (36.8 C) (Oral)   Resp 16   Ht 6' (1.829 m)   Wt 97.5 kg   SpO2 95%   BMI 29.16 kg/m   Patient appears in no acute distress.  ED Course / MDM   No reported events during my shift at the time of this note.   Pt is awaiting dispo from consultants   Buell Carmin MD    Buell Carmin, MD 11/07/23 [phone]

## 2023-11-07 NOTE — ED Notes (Signed)
 Pt attempting to get out of bed, pt repositioned by this RN and Theatre manager. Pt covered with blankets, and asking for cola. Pt given drink and tv turned on.

## 2023-11-07 NOTE — ED Notes (Signed)
 VOL/ TOC

## 2023-11-07 NOTE — ED Notes (Addendum)
 Pt noted to be wet, pt cleaned and new linens placed, new brief and chuck pad placed, pt covered with warm blankets. Placed sacral foam pad due to some redness on sacrum.

## 2023-11-07 NOTE — ED Notes (Signed)
 Pt laying in bed NAD. Pt has no needs at this time, currently dry.

## 2023-11-07 NOTE — ED Notes (Signed)
 Enteric Precautions taken, sign placed on door.

## 2023-11-07 NOTE — ED Notes (Signed)
 Pt given lunch tray.

## 2023-11-07 NOTE — ED Notes (Signed)
 Pt calling out due to being soiled. Pt cleaned of stool, new linens, brief and sacral pad placed. Pt repositioned and given warm blanket. Stool is loose type 5-6

## 2023-11-07 NOTE — ED Notes (Signed)
 Pt given dinner tray, pt stating not wanting to eat at the moment but does want his ice cream. Ice cream opened and given to pt.  Pt also asking for talkum powder saying his testicles are itchy/burn.

## 2023-11-07 NOTE — ED Notes (Signed)
 Pts brief checked and it is dry at this time.

## 2023-11-07 NOTE — ED Notes (Signed)
 Dr Peggi Bowels to bedside to assess scrotum. Pt cleaned of stool, dried and barrier cream applied to scrotum. New brief and chuck pad placed. Pt covered with blanket and given his ice cream back. Pt has no other needs at this time.

## 2023-11-07 NOTE — ED Provider Notes (Signed)
 Patient with multiple episodes of bowel movements with noted to have some irritation of his testicles suspect this is related to the stool.  We discussed cleaning the area, drying thoroughly and then placing barrier creams.  I do not see any evidence of necrotizing fasciitis, Fournier's gangrene on evaluation.  This all looks like contact dermatitis from the stool.  Discussed with the nurse to let us  know if they notice any discoloration or continued changes in the skin and to pass this along to future nursing staffing.    Lubertha Rush, MD 11/07/23 [phone]

## 2023-11-07 NOTE — ED Notes (Signed)
 Pt cleaned of stool, bed sheets changed, chuck pad placed, new brief placed, pt given new sacral foam pad on sacrum. Pt repositioned in bed, blanket given.

## 2023-11-07 NOTE — ED Notes (Signed)
 Rounding on pt, noted to be wet and laying sideways in bed. Pt repositioned, brief changed, barrier cream placed on testicles, some redness noted.

## 2023-11-07 NOTE — ED Notes (Signed)
 Pt stating his scrotum is starting to burn, pt checked for stool. Pt cleaned of stool, during placement of new breif and chucks pt started to urinate. Pt cleaned of urine, bed sheet changed, new chuck and brief placed. During placement again, pt had another BM. Pt cleaned of stool, new sacral pad placed, barrier cream placed on scrotum and rectum. Pt placed in new brief and chuck pad, repositioned in bed, covered with warm blanket.

## 2023-11-08 LAB — COMPREHENSIVE METABOLIC PANEL WITH GFR
ALT: 28 U/L (ref 0–44)
AST: 41 U/L (ref 15–41)
Albumin: 3.2 g/dL — ABNORMAL LOW (ref 3.5–5.0)
Alkaline Phosphatase: 92 U/L (ref 38–126)
Anion gap: 10 (ref 5–15)
BUN: 34 mg/dL — ABNORMAL HIGH (ref 8–23)
CO2: 25 mmol/L (ref 22–32)
Calcium: 8.8 mg/dL — ABNORMAL LOW (ref 8.9–10.3)
Chloride: 100 mmol/L (ref 98–111)
Creatinine, Ser: 1.57 mg/dL — ABNORMAL HIGH (ref 0.61–1.24)
GFR, Estimated: 42 mL/min — ABNORMAL LOW (ref >60)
Glucose, Bld: 118 mg/dL — ABNORMAL HIGH (ref 70–99)
Potassium: 4.3 mmol/L (ref 3.5–5.1)
Sodium: 135 mmol/L (ref 135–145)
Total Bilirubin: 0.6 mg/dL (ref 0.0–1.2)
Total Protein: 5.4 g/dL — ABNORMAL LOW (ref 6.5–8.1)

## 2023-11-08 NOTE — ED Notes (Signed)
vol/toc placement.. 

## 2023-11-08 NOTE — Progress Notes (Signed)
 Occupational Therapy Treatment Patient Details Name: JANELLE HATHORNE MRN: [id number] DOB: November 09, 1933 Today's Date: 11/08/2023   History of present illness Pt is an 89 y/o M admitted on 08/24/23 under IVC for aggression with staff & behavioral challenges (per psych, due to baseline dementia) at his facility. PMH: chronic LBP, CKD 3, essential HTN, HLD, L BBB, major depression, neuropathy, PVD.   OT comments  Pt seen for OT tx this date. Sleeping upon arrival, and easily awakens to voice. Pt is pleasant, and states he is happy to see this author and mobility specialist. Improved bed mobility, transitioning from supine to sit with MIN A and increased time. Sits EOB with supervision for seated balance once hips are scooted forward, improving from last session. Able to initiate task of donning pants by performing figure four. Requires MAX A +2 to stand to pull pants over hips. Pt continues to present with deficits that require 24/7 supervision, he requires significant physical assist for all BADL performance due to cognition, and requires +2 assist for transfers.       If plan is discharge home, recommend the following:  Supervision due to cognitive status;Assist for transportation;Assistance with cooking/housework;Help with stairs or ramp for entrance;Two people to help with walking and/or transfers;Direct supervision/assist for financial management;Direct supervision/assist for medications management;Two people to help with bathing/dressing/bathroom   Equipment Recommendations  Other (comment)       Precautions / Restrictions Precautions Precautions: Fall Recall of Precautions/Restrictions: Impaired Precaution/Restrictions Comments: hx of restlessness, agitation and aggression. heavy posterior lean however cooperative, pleasant, and no Restrictions Weight Bearing Restrictions Per Provider Order: No       Mobility Bed Mobility Overal bed mobility: Needs Assistance Bed Mobility: Supine to Sit,  Sit to Supine     Supine to sit: Min assist Sit to supine: Min assist        Transfers Overall transfer level: Needs assistance Equipment used: Rolling walker (2 wheels) Transfers: Sit to/from Stand Sit to Stand: Max assist, +2 physical assistance           General transfer comment: maxA x 2 to stand 2x trials     Balance Overall balance assessment: Needs assistance Sitting-balance support: Feet supported, No upper extremity supported Sitting balance-Leahy Scale: Fair Sitting balance - Comments: SUPERVISION for seated balance     Standing balance-Leahy Scale: Poor Standing balance comment: pt is at high risk of falls mostly due to cognition and poor safety awareness. was able to static stand with CGA x ~ 4 minutes                           ADL either performed or assessed with clinical judgement   ADL Overall ADL's : Needs assistance/impaired                     Lower Body Dressing: Minimal assistance;Sit to/from stand;Maximal assistance Lower Body Dressing Details (indicate cue type and reason): pt performs seated figure four to initiate task, OT threads pands over feet and pt pulls over leg. MAX A +2 to stand / balance to pull pants up over hips     Toileting- Clothing Manipulation and Hygiene: Maximal assistance;Bed level Toileting - Clothing Manipulation Details (indicate cue type and reason): pt returned to bed and voiced he needed to urinate; soiled brief before transfer to commode could be attempted. maxA to doff soiled brief and don fresh one bed level     Functional mobility during ADLs: +  2 for physical assistance;Rolling walker (2 wheels);Cueing for sequencing;Cueing for safety;Maximal assistance       Communication Communication Communication: Impaired Factors Affecting Communication: Hearing impaired   Cognition Arousal: Alert Behavior During Therapy: WFL for tasks assessed/performed Cognition: History of cognitive impairments,  Cognition impaired   Orientation impairments: Place, Time, Situation Awareness: Intellectual awareness impaired Memory impairment (select all impairments): Short-term memory, Working [employer] fast streamer, Non-declarative long-term memory, Geneticist, molecular long-term memory Attention impairment (select first level of impairment): Focused attention Executive functioning impairment (select all impairments): Initiation, Organization, Sequencing, Reasoning, Problem solving OT - Cognition Comments: pleasant and cooperative. better ability to follow commands this session. increased difficulties motor planning and heavy posterior lean with forward weight shift.                 Following commands: Impaired Following commands impaired: Follows one step commands with increased time      Cueing   Cueing Techniques: Verbal cues, Tactile cues             Pertinent Vitals/ Pain       Pain Assessment Pain Assessment: No/denies pain Pain Score: 0-No pain   Frequency  Min 2X/week        Progress Toward Goals  OT Goals(current goals can now be found in the care plan section)  Progress towards OT goals: Progressing toward goals  Acute Rehab OT Goals OT Goal Formulation: Patient unable to participate in goal setting Time For Goal Achievement: 12/16/23 Potential to Achieve Goals: Fair ADL Goals Pt Will Perform Grooming: sitting;with contact guard assist Pt Will Perform Upper Body Bathing: sitting;with set-up Pt Will Perform Lower Body Bathing: with min assist;sit to/from stand Pt Will Perform Upper Body Dressing: with min assist;sitting Pt Will Perform Lower Body Dressing: with min assist;sit to/from stand Pt Will Transfer to Toilet: with +2 assist;squat pivot transfer;with mod assist Pt Will Perform Toileting - Clothing Manipulation and hygiene: with max assist;sitting/lateral leans;bed level  Plan         AM-PAC OT "6 Clicks" Daily Activity     Outcome Measure   Help from another person eating  meals?: None Help from another person taking care of personal grooming?: A Little Help from another person toileting, which includes using toliet, bedpan, or urinal?: Total Help from another person bathing (including washing, rinsing, drying)?: A Lot Help from another person to put on and taking off regular upper body clothing?: A Little Help from another person to put on and taking off regular lower body clothing?: Total 6 Click Score: 14    End of Session Equipment Utilized During Treatment: Rolling walker (2 wheels)  OT Visit Diagnosis: Unsteadiness on feet (R26.81);Repeated falls (R29.6);Muscle weakness (generalized) (M62.81)   Activity Tolerance Patient tolerated treatment well   Patient Left in bed;with bed alarm set;with call bell/phone within reach   Nurse Communication Mobility status        Time: 1610-9604 OT Time Calculation (min): 24 min  Charges: OT General Charges $OT Visit: 1 Visit OT Treatments $Self Care/Home Management : 23-37 mins  Dezirea Mccollister L. Brenleigh Collet, OTR/L  11/08/23, 4:58 PM

## 2023-11-08 NOTE — ED Notes (Signed)
 Patient redirected with TV and ice cream at this time.

## 2023-11-08 NOTE — ED Provider Notes (Signed)
-----------------------------------------   5:39 AM on 11/08/2023 -----------------------------------------   Blood pressure (!) 157/121, pulse 82, temperature 97.7 F (36.5 C), temperature source Oral, resp. rate 18, height 6' (1.829 m), weight 97.5 kg, SpO2 96%.  The patient is calm and cooperative at this time.  There have been no acute events since the last update.  Awaiting disposition plan from Social Work team.   Kacee Koren J, MD 11/08/23 [phone]

## 2023-11-08 NOTE — ED Notes (Signed)
 Pt cleaned up with cleansing wipes after brief was found wet. Barrier cream applied and new brief, bed sheets, and bed pads applied.

## 2023-11-08 NOTE — ED Notes (Signed)
 This RN checked patient's brief and found patient to have had a small episode of urinary incontinence. Patient cleaned up and placed in a clean brief. Sacrum foam remains unsoiled and in place. Deodorant applied to patient. Patient brushed teeth. Patient placed in a comfortable position and bed returned to lowest position. Patient denies other needs at this time and states "I think I'll just rest a while".

## 2023-11-08 NOTE — TOC Progression Note (Addendum)
 Transition of Care [facility]) - Progression Note    Patient Details  Name: Hunter [name] MRN: [id number] Date of Birth: [birth year]  [facility] CM/SW Contact  [name], RN Phone Number: 11/08/2023, 11:35 AM  Clinical Narrative:    Received a call from his legal guardian [name], inquiring if he was accepted by Katherina Pan for [facility]. [name] was declined on 11/01/23 and this info was passed along to his guardian [name]. Asked [name] if she knows of anyplace else we can try and she stated she does not. She also mentioned placement may be more difficult for patients on antipsychotic medications due to law changes in . 1530: Patient was declined by Katherina Pan at [facility] because he had another patient that he felt was a better fit as far as behaviors. TOC to continue to follow and try to find placement.    Expected Discharge Plan:  (TBD) Barriers to Discharge:  (finding an accepting facility)  Expected Discharge Plan and Services     Post Acute Care Choice:  (TBD) Living arrangements for the past 2 months: Skilled Nursing Facility                                       Social Determinants of Health (SDOH) Interventions SDOH Screenings   Food Insecurity: No Food Insecurity (06/13/2023)   Received from [facility]  Housing: Low Risk  (06/09/2023)  Transportation Needs: No Transportation Needs (06/13/2023)   Received from [facility]  Utilities: Low Risk  (06/13/2023)   Received from [facility]  Alcohol Screen: Low Risk  (07/06/2021)  Depression (PHQ2-9): Medium Risk (11/26/2022)  Financial Resource Strain: Low Risk  (06/13/2023)   Received from [facility]  Physical Activity: Insufficiently Active (06/13/2023)   Received from [facility]  Social Connections: Moderately Integrated (06/13/2023)   Received from [facility]  Stress: No Stress Concern Present (06/13/2023)   Received from [facility]   Tobacco Use: Low Risk  (08/23/2023)  Recent Concern: Tobacco Use - Medium Risk (06/14/2023)   Received from [facility] Literacy: Medium Risk (06/13/2023)   Received from [facility]    Readmission Risk Interventions     No data to display

## 2023-11-09 NOTE — ED Provider Notes (Signed)
-----------------------------------------   4:03 AM on 11/09/2023 -----------------------------------------   Blood pressure 117/61, pulse 62, temperature 97.6 F (36.4 C), temperature source Oral, resp. rate 16, height 6' (1.829 m), weight 97.5 kg, SpO2 97%.  The patient is calm and cooperative at this time.  There have been no acute events since the last update.  Awaiting disposition plan from case management/social work.    Jawan Chavarria, [name], DO 11/09/23 [phone]

## 2023-11-09 NOTE — ED Notes (Signed)
 This RN and Doylene Genet, EDT relocated patient into the bed from the side of bed to maintain safety. Patient was difficult to redirect and took several attempts to reposition.
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
## 2023-11-09 NOTE — ED Notes (Signed)
 Vol?pending TOC Placement

## 2023-11-09 NOTE — ED Notes (Signed)
 Patient attempting to get out of the bed. Patient given stuffed animal and warm blanket, allowed staff to boost him back up onto the bed.

## 2023-11-09 NOTE — ED Notes (Signed)
 Pt repositioned in bed.
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
## 2023-11-10 NOTE — ED Notes (Signed)
 Vol toc placement

## 2023-11-10 NOTE — ED Provider Notes (Signed)
-----------------------------------------   7:05 AM on 11/10/2023 -----------------------------------------   Blood pressure 118/60, pulse (!) 59, temperature 97.6 F (36.4 C), temperature source Oral, resp. rate 16, height 6' (1.829 m), weight 97.5 kg, SpO2 94%.  The patient is calm and cooperative at this time.  There have been no acute events since the last update.  Awaiting disposition plan from case management/social work.    Elpidia Karn, [name], DO 11/10/23 [phone]
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
## 2023-11-10 NOTE — ED Notes (Signed)
 Dinner tray and tea provided to patient. Pt tolerated well, waste disposed of properly.

## 2023-11-10 NOTE — ED Notes (Signed)
 MD Vicenta Graft made aware of patient's low BP.

## 2023-11-10 NOTE — ED Notes (Signed)
 This tech and paramedic student Hailey completed full bed linen and brief change on patient. Patients bed and brief were saturated in urine.  New brief, chux, bedspread, and blankets were put on patient.  Patient is resting peacefully in bed watching tv, no other concerns were voiced.

## 2023-11-10 NOTE — Progress Notes (Signed)
 Physical Therapy Treatment Patient Details Name: Hunter [name] MRN: [id number] DOB: [birth year] Today's Date: 11/10/2023   History of Present Illness Pt is an 89 y/o M admitted on 08/24/23 under IVC for aggression with staff & behavioral challenges (per psych, due to baseline dementia) at his facility. PMH: chronic LBP, CKD 3, essential HTN, HLD, L BBB, major depression, neuropathy, PVD.    PT Comments  Patient is cooperative and calm throughout session. Multi modal cues required for single step command following. Max A for bed mobility. Mod A +2 for standing x 3 bouts with cues for technique. Pre gait activity performed with +2 person assistance. Ambulation limited by generalized LE weakness, poor standing balance, and limited standing tolerance. Recommend to continue PT to maximize independence and to decrease caregiver burden.   If plan is discharge home, recommend the following: Assistance with cooking/housework;Direct supervision/assist for medications management;Assist for transportation;Help with stairs or ramp for entrance;Supervision due to cognitive status;A lot of help with walking and/or transfers;A lot of help with bathing/dressing/bathroom   Can travel by private vehicle     No  Equipment Recommendations  Rolling walker (2 wheels);Wheelchair (measurements PT);Wheelchair cushion (measurements PT);BSC/3in1;Hospital bed    Recommendations for Other Services       Precautions / Restrictions Precautions Precautions: Fall Recall of Precautions/Restrictions: Impaired Restrictions Weight Bearing Restrictions Per Provider Order: No     Mobility  Bed Mobility Overal bed mobility: Needs Assistance Bed Mobility: Supine to Sit, Sit to Supine     Supine to sit: Max assist Sit to supine: Max assist        Transfers Overall transfer level: Needs assistance Equipment used: Rolling walker (2 wheels) Transfers: Sit to/from Stand Sit to Stand: Mod assist, +2 physical  assistance           General transfer comment: verbal cues for hand placement and technique to facilitate standing. still with tendency to pull on the rolling walker. 3 bouts of standing performed    Ambulation/Gait             Pre-gait activities: poor standing balance with emphaiss on upright standing posture. patient able to take 3 side steps to the right with maximal cues and +2 person assistance for lateral and anterior weight shifting. posterior lean throughout with standing     Stairs             Wheelchair Mobility     Tilt Bed    Modified Rankin (Stroke Patients Only)       Balance Overall balance assessment: Needs assistance Sitting-balance support: Feet supported, No upper extremity supported Sitting balance-Leahy Scale: Fair     Standing balance support: Bilateral upper extremity supported, During functional activity Standing balance-Leahy Scale: Poor Standing balance comment: external support required to maintain standing balance. with posterior lean                            Communication Communication Communication: Impaired Factors Affecting Communication: Hearing impaired  Cognition Arousal: Alert Behavior During Therapy: WFL for tasks assessed/performed   PT - Cognitive impairments: History of cognitive impairments                       PT - Cognition Comments: cooperative and calm throughout session. frequent cues for attention to task. patient is tangential. Following commands: Impaired Following commands impaired: Follows one step commands with increased time (multi modal cues)    Cueing Cueing Techniques: Verbal  cues, Tactile cues  Exercises General Exercises - Lower Extremity Long Arc Quad: AAROM, AROM, Strengthening, Both, 10 reps (5 reps with AROM, 5 reps with AAROM)    General Comments        Pertinent Vitals/Pain Pain Assessment Pain Assessment: No/denies pain    Home Living                           Prior Function            PT Goals (current goals can now be found in the care plan section) Acute Rehab PT Goals Patient Stated Goal: none stated PT Goal Formulation: With patient Time For Goal Achievement: 11/24/23 Potential to Achieve Goals: Fair Progress towards PT goals: Progressing toward goals    Frequency    Min 1X/week      PT Plan      Co-evaluation PT/OT/SLP Co-Evaluation/Treatment: Yes Reason for Co-Treatment: To address functional/ADL transfers PT goals addressed during session: Mobility/safety with mobility        AM-PAC PT "6 Clicks" Mobility   Outcome Measure  Help needed turning from your back to your side while in a flat bed without using bedrails?: A Little Help needed moving from lying on your back to sitting on the side of a flat bed without using bedrails?: A Little Help needed moving to and from a bed to a chair (including a wheelchair)?: A Lot Help needed standing up from a chair using your arms (e.g., wheelchair or bedside chair)?: A Lot Help needed to walk in hospital room?: A Lot Help needed climbing 3-5 steps with a railing? : Total 6 Click Score: 13    End of Session Equipment Utilized During Treatment: Gait belt Activity Tolerance: Patient tolerated treatment well Patient left: in bed;with call bell/phone within reach;with bed alarm set Nurse Communication: Mobility status PT Visit Diagnosis: Unsteadiness on feet (R26.81);Muscle weakness (generalized) (M62.81);Other abnormalities of gait and mobility (R26.89);Difficulty in walking, not elsewhere classified (R26.2);History of falling (Z91.81)     Time: 1010-1037 PT Time Calculation (min) (ACUTE ONLY): 27 min  Charges:    $Therapeutic Activity: 8-22 mins PT General Charges $$ ACUTE PT VISIT: 1 Visit                     Ozie Bo, PT, MPT    Erlene Hawks 11/10/2023, 10:52 AM

## 2023-11-10 NOTE — TOC Progression Note (Signed)
 Transition of Care [facility]) - Progression Note    Patient Details  Name: Hunter [name] MRN: [id number] Date of Birth: Jul 13, 1934  Transition of [facility]) CM/SW Contact  Joslyn Nim, RN Phone Number: 11/10/2023, 11:38 AM  Clinical Narrative:    CM emailed referral to [facility] in [place]. Mr. Annis Baseman is not in today. CM left message for admission.    Expected Discharge Plan:  (TBD) Barriers to Discharge:  (finding an accepting facility)  Expected Discharge Plan and Services     Post Acute Care Choice:  (TBD) Living arrangements for the past 2 months: Skilled Nursing Facility                                       Social Determinants of Health (SDOH) Interventions SDOH Screenings   Food Insecurity: No Food Insecurity (06/13/2023)   Received from [facility]  Housing: Low Risk  (06/09/2023)  Transportation Needs: No Transportation Needs (06/13/2023)   Received from [facility]  Utilities: Low Risk  (06/13/2023)   Received from [facility]  Alcohol Screen: Low Risk  (07/06/2021)  Depression (PHQ2-9): Medium Risk (11/26/2022)  Financial Resource Strain: Low Risk  (06/13/2023)   Received from [facility]  Physical Activity: Insufficiently Active (06/13/2023)   Received from [facility]  Social Connections: Moderately Integrated (06/13/2023)   Received from [facility]  Stress: No Stress Concern Present (06/13/2023)   Received from [facility]  Tobacco Use: Low Risk  (08/23/2023)  Recent Concern: Tobacco Use - Medium Risk (06/14/2023)   Received from [facility] Literacy: Medium Risk (06/13/2023)   Received from [facility]    Readmission Risk Interventions     No data to display

## 2023-11-11 MED ORDER — OLANZAPINE 5 MG PO TABS
5.0000 mg | ORAL_TABLET | Freq: Once | ORAL | Status: AC
  Administered 2023-11-11: 5 mg via ORAL

## 2023-11-11 NOTE — ED Notes (Signed)
 Patient's bed alarm was going off, this NT walked into patient's room. Patient was wet and had a BM. This NT and Nurse Kaitlyn gave patient a bed bath. Patient returned to bed and had another BM in the bed pan. Patient had been cleaned up. Patient had a complete bed change, a clean gown, clean brief and chux. Patient is resting comfortably at this time.

## 2023-11-11 NOTE — ED Notes (Signed)
 RN at bedside. Pt attempting to get out of bed. Pt agitated and slamming hand on bedside table and gritting teeth. RN removed remote from bedside table due to pt increased agitation. Pt redirected back in bed by this RN and Primary RN.

## 2023-11-11 NOTE — ED Notes (Signed)
 Pt trying to get out of bed again. Pt getting agitated by the foot of the bed not opening, pt stated he doesn't know hwy the drawer won't open. When explained it was the foot of the bed pt punched the end of the bed. Pt redirected by a cold coca cola and he scooted himself up in the bed without assistance from staff.

## 2023-11-11 NOTE — ED Notes (Incomplete)
 Pt trying to get out of bed again. Pt getting agitated at the side rail on the bed, clenching his teeth, and

## 2023-11-11 NOTE — ED Notes (Signed)
 Patient has been able to walk to the toilet tonight with a walker and 2 person assist.

## 2023-11-11 NOTE — ED Notes (Signed)
 This Tech, [place] EDT and the nurse Odilia Bennett, assisted pt back into the bed from the recliner. Changed pt brief.

## 2023-11-11 NOTE — ED Provider Notes (Signed)
  BP 117/88 (BP Location: Right Arm)   Pulse 72   Temp 98 F (36.7 C) (Oral)   Resp 17   Ht 6' (1.829 m)   Wt 97.5 kg   SpO2 98%   BMI 29.16 kg/m    The patient is calm and cooperative at this time.  There have been no acute events since the last update.  Awaiting disposition plan from Behavioral Medicine and/or Social Work team(s).      Collis Deaner, MD 11/11/23 [phone]

## 2023-11-11 NOTE — ED Notes (Signed)
 Patient alarm going off and sitting at edge of bed. Patient helped to recliner at this time. Chair alarm in place. Socks on patient.

## 2023-11-11 NOTE — ED Notes (Signed)
 Pt given dinner plate and adjusted in bed.

## 2023-11-11 NOTE — ED Notes (Signed)
VOL TOC placement 

## 2023-11-11 NOTE — ED Provider Notes (Signed)
-----------------------------------------   7:00 AM on 11/11/2023 -----------------------------------------   Blood pressure (!) 121/42, pulse 61, temperature 98.5 F (36.9 C), temperature source Oral, resp. rate 18, height 6' (1.829 m), weight 97.5 kg, SpO2 99%.  The patient is calm and cooperative at this time.  There have been no acute events since the last update.  Awaiting disposition plan from Behavioral Medicine and/or Social Work team(s).    Lind Repine, MD 11/11/23 0700

## 2023-11-12 LAB — GASTROINTESTINAL PANEL BY PCR, STOOL (REPLACES STOOL CULTURE)

## 2023-11-12 NOTE — ED Notes (Signed)
 Pt resting in bed with bed alarm on

## 2023-11-12 NOTE — TOC Progression Note (Signed)
 Transition of Care [facility]) - Progression Note    Patient Details  Name: Hunter [name] MRN: [id number] Date of Birth: 05-13-1934  [facility] Care [facility]) CM/SW Contact  Joslyn Nim, RN Phone Number: 11/12/2023, 1:06 PM  Clinical Narrative:     CM spoke with Hunter [name] at [facility]. He states that  he has reviewed his information and cannot offer a bed due to his behavior.  Expected Discharge Plan:  (TBD) Barriers to Discharge:  (finding an accepting facility)  Expected Discharge Plan and Services     Post Acute Care Choice:  (TBD) Living arrangements for the past 2 months: Skilled Nursing Facility                                       Social Determinants of Health (SDOH) Interventions SDOH Screenings   Food Insecurity: No Food Insecurity (06/13/2023)   Received from [facility]  Housing: Low Risk  (06/09/2023)  Transportation Needs: No Transportation Needs (06/13/2023)   Received from [facility]  Utilities: Low Risk  (06/13/2023)   Received from [facility]  Alcohol Screen: Low Risk  (07/06/2021)  Depression (PHQ2-9): Medium Risk (11/26/2022)  Financial Resource Strain: Low Risk  (06/13/2023)   Received from [facility]  Physical Activity: Insufficiently Active (06/13/2023)   Received from [facility]  Social Connections: Moderately Integrated (06/13/2023)   Received from [facility]  Stress: No Stress Concern Present (06/13/2023)   Received from [facility]  Tobacco Use: Low Risk  (08/23/2023)  Recent Concern: Tobacco Use - Medium Risk (06/14/2023)   Received from [facility] Literacy: Medium Risk (06/13/2023)   Received from [facility]    Readmission Risk Interventions     No data to display

## 2023-11-12 NOTE — ED Notes (Signed)
 Vol toc placement

## 2023-11-12 NOTE — ED Notes (Signed)
 Pt wanted to get out of bed and was placed by this RN and an ED tech into chair with bed alarm on.

## 2023-11-12 NOTE — ED Notes (Addendum)
 Pt cleaned up after brief was noted to have stool and urine. Pt given a bed bath and new socks, brief, bed pads, and mepilex applied.

## 2023-11-12 NOTE — ED Notes (Signed)
 Pt's dinner tray set up on bedside table and pt's head of bed elevated so that patient could eat dinner. Pt didn't have any other requests at this time.

## 2023-11-12 NOTE — ED Notes (Addendum)
 This tech along with Marliss Simple, RN, assisted the patient to the toilet in their room. Pt had a bowel movement and was wiped with bath wipes. Pt used a walker to ambulate. A new brief was put on the patient. Patient is now sitting in recliner watching TV, with chair alarm on. Patient has no other requests at this time.

## 2023-11-12 NOTE — ED Provider Notes (Signed)
 Emergency Medicine Observation Re-evaluation Note  Hunter [name] is a 89 y.o. male, waiting TOC placement  Physical Exam  BP (!) 110/55   Pulse 63   Temp 97.9 F (36.6 C) (Oral)   Resp 14   Ht 6' (1.829 m)   Wt 97.5 kg   SpO2 99%   BMI 29.16 kg/m  Physical Exam General: calm   ED Course / MDM  No new labs in past 24 hours  Plan  Current plan is for TOC placement.    Marylynn Soho, MD 11/12/23 2135

## 2023-11-12 NOTE — ED Notes (Signed)
 Pt attempting to get out of bed. Pt helped back in bed. Pt appears to be incontinent of urine in brief. Pt cleaned and placed into clean brief. Pt adjusted in bed to a level of comfort. No other needs identified at this time.

## 2023-11-12 NOTE — ED Notes (Signed)
 Pt brief, bed sheets, gown, and bed pads changed after brief was found wet. Pt cleaned up with cleansing wipes.

## 2023-11-12 NOTE — ED Notes (Signed)
 Pt was observed in bed with his eyes closed. Respirations was even and unlabored.

## 2023-11-12 NOTE — ED Notes (Addendum)
 Pt sitting up in bed eating his breakfast

## 2023-11-12 NOTE — ED Notes (Signed)
 Pt sitting in chair and eating his lunch

## 2023-11-12 NOTE — TOC Progression Note (Addendum)
 Transition of Care [facility]) - Progression Note    Patient Details  Name: Hunter [name] MRN: [id number] Date of Birth: January 10, 1934  Transition of Care [facility]) CM/SW Contact  Elmira Haddock, LCSW Phone Number: 11/12/2023, 12:30 PM  Clinical Narrative:    Phone calls made to facilities for memory are placement.  Clinicals to be faxed to those facilities.  [facility] [place] [employer] [address] [facility]  TOC to follow-up for discharge planning.   Expected Discharge Plan:  (TBD) Barriers to Discharge:  (finding an accepting facility)  Expected Discharge Plan and Services     Post Acute Care Choice:  (TBD) Living arrangements for the past 2 months: Skilled Nursing Facility                                       Social Determinants of Health (SDOH) Interventions SDOH Screenings   Food Insecurity: No Food Insecurity (06/13/2023)   Received from [facility]  Housing: Low Risk  (06/09/2023)  Transportation Needs: No Transportation Needs (06/13/2023)   Received from [facility]  Utilities: Low Risk  (06/13/2023)   [facility] from [facility]  Alcohol Screen: Low Risk  (07/06/2021)  Depression (PHQ2-9): Medium Risk (11/26/2022)  Financial Resource Strain: Low Risk  (06/13/2023)   Received from [facility]  Physical Activity: Insufficiently Active (06/13/2023)   Received from [facility]  Social Connections: Moderately Integrated (06/13/2023)   Received from [facility]  Stress: No Stress Concern Present (06/13/2023)   Received from [facility]  Tobacco Use: Low Risk  (08/23/2023)  Recent Concern: Tobacco Use - Medium Risk (06/14/2023)   Received from [facility] Literacy: Medium Risk (06/13/2023)    Received from [facility]    Readmission Risk Interventions     No data to display

## 2023-11-13 NOTE — ED Notes (Signed)
 Pt eating breakfast no other needs at this time

## 2023-11-13 NOTE — ED Notes (Signed)
 This RN and NT to change pt brief

## 2023-11-13 NOTE — ED Provider Notes (Signed)
-----------------------------------------   6:01 AM on 11/13/2023 -----------------------------------------   Blood pressure 121/61, pulse 68, temperature 98.1 F (36.7 C), temperature source Oral, resp. rate 18, height 6' (1.829 m), weight 97.5 kg, SpO2 98%.  The patient is calm and cooperative at this time.  There have been no acute events since the last update.  Awaiting disposition plan from Social Work team.   Angelene Rome J, MD 11/13/23 [phone]

## 2023-11-13 NOTE — ED Notes (Signed)
 This RN to assist pt in eating dinner

## 2023-11-13 NOTE — ED Notes (Signed)
 Pt was assisted from bed to chair with one person assist

## 2023-11-13 NOTE — ED Notes (Signed)
 Pt attempting to get out of bed, with assistance of Janeatte NT helped pt to recliner

## 2023-11-13 NOTE — ED Notes (Addendum)
 Pt stood up from recliner with 2 assist, walked a couple of steps with walker and sat back on recliner. This RN and Janetta NT assisted pt from recliner to bed. No other needs at this time

## 2023-11-13 NOTE — ED Notes (Signed)
 Pt provided lunch tray.

## 2023-11-13 NOTE — ED Notes (Signed)
 This RN to assist pt in eating lunch

## 2023-11-13 NOTE — ED Notes (Signed)
Provided pt with a cup of water. °

## 2023-11-13 NOTE — ED Notes (Signed)
 Pt remains restless, continues to try and get out of bed, behavior redirectable with much encouragement.

## 2023-11-13 NOTE — ED Notes (Signed)
 Pt provided with lunch tray. Pt sitting up eating.

## 2023-11-13 NOTE — ED Notes (Signed)
Patient is vol pending TOC placement 

## 2023-11-13 NOTE — ED Notes (Signed)
 This RN and NT to change pt brief and linen.

## 2023-11-13 NOTE — ED Notes (Signed)
 Pt is awake and alert. Pt sitting on recliner, calm no distress noted.

## 2023-11-13 NOTE — ED Notes (Signed)
 Pt incontinent of urine, changed pt's incontinent pad, no other needs at present

## 2023-11-14 NOTE — ED Notes (Signed)
 Pt cleaned of urine, linen changed, new brief applied, barrier cream applied to groin. Pt repositioned in bed and covered with warm blankets.

## 2023-11-14 NOTE — ED Notes (Signed)
 Pt seen pulling off sheets and brief, this tech changed pt's bed and brief and given warms blankets.

## 2023-11-14 NOTE — ED Notes (Signed)
 Pt provided with pericare, clean dry brief, warm blankets, and coffee.  Calm and cooperative at this time.

## 2023-11-14 NOTE — ED Provider Notes (Signed)
-----------------------------------------   5:24 AM on 11/14/2023 -----------------------------------------   Blood pressure (!) 101/51, pulse 63, temperature 98 F (36.7 C), temperature source Oral, resp. rate 18, height 6' (1.829 m), weight 97.5 kg, SpO2 96%.  The patient is calm and cooperative at this time.  There have been no acute events since the last update.  Awaiting disposition plan from Social Work team.   Luciano Cinquemani J, MD 11/14/23 [phone]

## 2023-11-14 NOTE — ED Notes (Signed)
 Pt noted to have had a BM, pt was cleaned, barrier cream applied, new brief and chuck pad placed. Linens changed. Warm blankets given.

## 2023-11-14 NOTE — ED Notes (Signed)
 Pt found sitting on edge of bed attempting to get up. Pt stating trying to get his medicine near the sink, this RN asked pt which meds he needed and pt could not answer. Pt repositioned in bed and covered with his blanket.

## 2023-11-14 NOTE — ED Notes (Signed)
 Pt attempting to let side rail down, he is unsure how to complete this task, pt reminded of need to stay in bed to prevent falls.

## 2023-11-14 NOTE — ED Notes (Signed)
 Breakfast tray placed on bedside table by dietary, pt currently asleep.

## 2023-11-14 NOTE — ED Notes (Signed)
 Pt still attempting to let side rail down, pt reminded to try and rest.

## 2023-11-14 NOTE — ED Notes (Signed)
 Pt given lunch tray by dietary

## 2023-11-14 NOTE — ED Notes (Signed)
 Pt given night meds and sandwich tray.

## 2023-11-14 NOTE — ED Notes (Signed)
 Pt sat up in bed, dinner tray on bedside table, placed over pt to eat.

## 2023-11-15 MED ORDER — LORAZEPAM 1 MG PO TABS
1.0000 mg | ORAL_TABLET | Freq: Once | ORAL | Status: AC
  Administered 2023-11-15: 1 mg via ORAL
  Filled 2023-11-15: qty 1

## 2023-11-15 NOTE — TOC Progression Note (Signed)
 Transition of Care [facility]) - Progression Note    Patient Details  Name: Hunter [name] MRN: [id number] Date of Birth: 04-20-1934  Transition of Care [facility]) CM/SW Contact  Elmira Haddock, LCSW Phone Number: 11/15/2023, 1:27 PM  Clinical Narrative:   TOC followed up on faxes sent to facilities:  [facility] will review  [facility] states that they can't take patient due to behaviors. Called [facility], spoke with [place].  Faxed clinicals to [phone] for review.  [facility]: Couldn't lvm.  Space full.  [facility]:  LVM for admissions  [facility]: LVM  [place]: LVM for [employer]:  LVM for [employer]:  Per Nellie Banas in Admissions they are reviewing  [place]:  LVM for [employer] in Adm  [place]:  Per Lexie, clinicals not rec'd.  Refaxed.  [place]:  Per Doylene Genet.  Clinicals not rec'd.  Refaxed,     Expected Discharge Plan:  (TBD) Barriers to Discharge:  (finding an accepting facility)  Expected Discharge Plan and Services     Post Acute Care Choice:  (TBD) Living arrangements for the past 2 months: Skilled Nursing Facility                                       Social Determinants of Health (SDOH) Interventions SDOH Screenings   Food Insecurity: No Food Insecurity (06/13/2023)   Received from [facility]  Housing: Low Risk  (06/09/2023)  Transportation Needs: No Transportation Needs (06/13/2023)   Received from [facility]  Utilities: Low Risk  (06/13/2023)   [facility]  Alcohol Screen: Low Risk  (07/06/2021)  Depression (PHQ2-9): Medium Risk (11/26/2022)  Financial Resource Strain: Low Risk  (06/13/2023)   Received from [facility]  Physical Activity: Insufficiently Active (06/13/2023)   Received from [facility]  Social Connections: Moderately Integrated (06/13/2023)   Received from [facility]   Stress: No Stress Concern Present (06/13/2023)   [facility] from [facility]  Tobacco Use: Low Risk  (08/23/2023)  Recent Concern: Tobacco Use - Medium Risk (06/14/2023)   Received from [facility] Literacy: Medium Risk (06/13/2023)   Received from [facility]    Readmission Risk Interventions     No data to display

## 2023-11-15 NOTE — ED Notes (Signed)
 Pt continues to try to get out of bed. Pt redirected back to bed.

## 2023-11-15 NOTE — ED Notes (Signed)
 Patient attempting to get out of bed, pt redirected back to bed

## 2023-11-15 NOTE — ED Provider Notes (Signed)
 Emergency Medicine Observation Re-evaluation Note  Physical Exam   BP (!) 114/57 (BP Location: Right Arm)   Pulse 64   Temp 98.4 F (36.9 C) (Oral)   Resp 18   Ht 6' (1.829 m)   Wt 97.5 kg   SpO2 98%   BMI 29.16 kg/m   Patient appears in no acute distress.  ED Course / MDM   No reported events during my shift at the time of this note.   Pt is awaiting dispo from consultants   Buell Carmin MD    Buell Carmin, MD 11/15/23 [phone]

## 2023-11-15 NOTE — ED Notes (Signed)
 Pt attempting to get out of bed, pt redirected back to bed.

## 2023-11-15 NOTE — ED Notes (Signed)
 Patient shaking the railing of the bed in agitation.

## 2023-11-15 NOTE — Progress Notes (Signed)
 Occupational Therapy Treatment Patient Details Name: Hunter [name] MRN: [id number] DOB: 29-Feb-1934 Today's Date: 11/15/2023   History of present illness Pt is an 89 y/o M admitted on 08/24/23 under IVC for aggression with staff & behavioral challenges (per psych, due to baseline dementia) at his facility. PMH: chronic LBP, CKD 3, essential HTN, HLD, L BBB, major depression, neuropathy, PVD.   OT comments  Pt received in bed, playing with gown on lap but removed clothing and left brief in bed. Pleasantly confused, MAX A for bed mobility, and posterior/L lateral lean while seated EOB requiring MOD - MAX A to correct. Pt with poor ability to comprehend instructions this date; requires max multimodal cuing to stand at bedside MAX A +2 for linen change/pericare. Incontinent of urine in standing, requires 2x linen changes throughout session with RN assistance. Rolling L<>R in bed to don gown and brief. Discharge recommendation appropriate. OT will continue to follow.       If plan is discharge home, recommend the following:  Supervision due to cognitive status;Assist for transportation;Assistance with cooking/housework;Help with stairs or ramp for entrance;Two people to help with walking and/or transfers;Direct supervision/assist for financial management;Direct supervision/assist for medications management;Two people to help with bathing/dressing/bathroom   Equipment Recommendations  Other (comment)    Recommendations for Other Services      Precautions / Restrictions Precautions Precautions: Fall Recall of Precautions/Restrictions: Impaired Precaution/Restrictions Comments: hx of restlessness, agitation and aggression. heavy posterior lean however cooperative, pleasant with therapy Restrictions Weight Bearing Restrictions Per Provider Order: No       Mobility Bed Mobility Overal bed mobility: Needs Assistance Bed Mobility: Supine to Sit, Sit to Supine     Supine to sit: Max assist Sit  to supine: Max assist        Transfers Overall transfer level: Needs assistance Equipment used: Rolling walker (2 wheels) Transfers: Sit to/from Stand Sit to Stand: Max assist, +2 physical assistance, +2 safety/equipment                 Balance Overall balance assessment: Needs assistance Sitting-balance support: Feet supported, No upper extremity supported Sitting balance-Leahy Scale: Fair   Postural control: Posterior lean, Left lateral lean Standing balance support: Bilateral upper extremity supported, During functional activity Standing balance-Leahy Scale: Poor                             ADL either performed or assessed with clinical judgement   ADL Overall ADL's : Needs assistance/impaired Eating/Feeding: Sitting;Set up Eating/Feeding Details (indicate cue type and reason): able to drink milk when OT opens straw and places in container             Upper Body Dressing : Sitting;Maximal assistance Upper Body Dressing Details (indicate cue type and reason): heavy posterior lean, requires MAX A to don new gown due to poor comprehension of directions Lower Body Dressing: Bed level;+2 for physical assistance;Maximal assistance Lower Body Dressing Details (indicate cue type and reason): maxA to roll for brief donning in bed             Functional mobility during ADLs: +2 for physical assistance;Rolling walker (2 wheels);Cueing for sequencing;Cueing for safety;Maximal assistance General ADL Comments: pt found in bed, having doffed urine-soaked brief and gown. covered in chocolate ice cream. requires +2 for bathing and pericare. heavy posterior lean while EOB. stands with maxA+2, incontinent of urine in standing     Communication Communication Communication: Impaired  Cognition Arousal: Alert, Lethargic Behavior During Therapy: WFL for tasks assessed/performed Cognition: History of cognitive impairments, Cognition impaired             OT -  Cognition Comments: restless at times, slightly lethargic                 Following commands: Impaired Following commands impaired: Follows one step commands with increased time      Cueing   Cueing Techniques: Verbal cues, Tactile cues        General Comments RN in room to assist    Pertinent Vitals/ Pain       Pain Assessment Pain Assessment: No/denies pain   Frequency  Min 2X/week        Progress Toward Goals  OT Goals(current goals can now be found in the care plan section)  Progress towards OT goals: Progressing toward goals  Acute Rehab OT Goals OT Goal Formulation: Patient unable to participate in goal setting Time For Goal Achievement: 12/16/23 Potential to Achieve Goals: Fair ADL Goals Pt Will Perform Grooming: sitting;with contact guard assist Pt Will Perform Upper Body Bathing: sitting;with set-up Pt Will Perform Lower Body Bathing: with min assist;sit to/from stand Pt Will Perform Upper Body Dressing: with min assist;sitting Pt Will Perform Lower Body Dressing: with min assist;sit to/from stand Pt Will Transfer to Toilet: with +2 assist;squat pivot transfer;with mod assist Pt Will Perform Toileting - Clothing Manipulation and hygiene: with max assist;sitting/lateral leans;bed level  Plan      Co-evaluation      Reason for Co-Treatment: To address functional/ADL transfers PT goals addressed during session: Mobility/safety with mobility        AM-PAC OT "6 Clicks" Daily Activity     Outcome Measure   Help from another person eating meals?: None Help from another person taking care of personal grooming?: Total Help from another person toileting, which includes using toliet, bedpan, or urinal?: Total Help from another person bathing (including washing, rinsing, drying)?: Total Help from another person to put on and taking off regular upper body clothing?: Total Help from another person to put on and taking off regular lower body clothing?:  Total 6 Click Score: 9    End of Session Equipment Utilized During Treatment: Rolling walker (2 wheels)  OT Visit Diagnosis: Unsteadiness on feet (R26.81);Repeated falls (R29.6);Muscle weakness (generalized) (M62.81)   Activity Tolerance Patient tolerated treatment well   Patient Left in bed;with bed alarm set;with call bell/phone within reach   Nurse Communication Mobility status        Time: 1324-4010 OT Time Calculation (min): 26 min  Charges: OT General Charges $OT Visit: 1 Visit OT Treatments $Self Care/Home Management : 8-22 mins  Aveyah Greenwood L. Valina Maes, OTR/L  11/15/23, 4:38 PM

## 2023-11-15 NOTE — ED Notes (Signed)
 Patient given a breakfast tray.

## 2023-11-15 NOTE — ED Notes (Signed)
Pt's brief and sheets changed.

## 2023-11-15 NOTE — Progress Notes (Signed)
 Physical Therapy Treatment Patient Details Name: Hunter [name] MRN: [id number] DOB: [birth year] Today's Date: 11/15/2023   History of Present Illness Pt is an 89 y/o M admitted on 08/24/23 under IVC for aggression with staff & behavioral challenges (per psych, due to baseline dementia) at his facility. PMH: chronic LBP, CKD 3, essential HTN, HLD, L BBB, major depression, neuropathy, PVD.    PT Comments  Patient supine in bed with no clothing on on arrival. Pleasantly confused throughout but oriented to self only. Required maxA for bed mobility and mod-maxA to maintain sitting balance. Required maxA+2 to stand at beside with RW to change linens due to incontinence while supine. In standing, patient incontinent of urine again. Returned to supine to change linens again with min-modA for rolling L/R. Discharge plan remains appropriate.     If plan is discharge home, recommend the following: Assistance with cooking/housework;Direct supervision/assist for medications management;Assist for transportation;Help with stairs or ramp for entrance;Supervision due to cognitive status;A lot of help with walking and/or transfers;A lot of help with bathing/dressing/bathroom   Can travel by private vehicle     No  Equipment Recommendations  Rolling Hunter [name] (2 wheels);Wheelchair (measurements PT);Wheelchair cushion (measurements PT);BSC/3in1;Hospital bed    Recommendations for Other Services       Precautions / Restrictions Precautions Precautions: Fall Recall of Precautions/Restrictions: Impaired Precaution/Restrictions Comments: hx of restlessness, agitation and aggression. heavy posterior lean however cooperative, pleasant with therapy Restrictions Weight Bearing Restrictions Per Provider Order: No     Mobility  Bed Mobility Overal bed mobility: Needs Assistance Bed Mobility: Supine to Sit, Sit to Supine     Supine to sit: Max assist Sit to supine: Max assist        Transfers Overall transfer  level: Needs assistance Equipment used: Rolling Hunter [name] (2 wheels) Transfers: Sit to/from Stand Sit to Stand: Max assist, +2 physical assistance, +2 safety/equipment           General transfer comment: assist for anterior weight shift and boost into standing. Unable to take steps this date. Incontinent of urine in standing    Ambulation/Gait                   Stairs             Wheelchair Mobility     Tilt Bed    Modified Rankin (Stroke Patients Only)       Balance Overall balance assessment: Needs assistance Sitting-balance support: Feet supported, No upper extremity supported Sitting balance-Leahy Scale: Fair   Postural control: Posterior lean, Left lateral lean Standing balance support: Bilateral upper extremity supported, During functional activity Standing balance-Leahy Scale: Poor                              Communication Communication Communication: Impaired  Cognition Arousal: Alert Behavior During Therapy: WFL for tasks assessed/performed   PT - Cognitive impairments: History of cognitive impairments                         Following commands: Impaired Following commands impaired: Follows one step commands with increased time    Cueing Cueing Techniques: Verbal cues, Tactile cues  Exercises      General Comments        Pertinent Vitals/Pain Pain Assessment Pain Assessment: No/denies pain    Home Living  Prior Function            PT Goals (current goals can now be found in the care plan section) Acute Rehab PT Goals Patient Stated Goal: none stated PT Goal Formulation: With patient Time For Goal Achievement: 11/24/23 Potential to Achieve Goals: Fair Progress towards PT goals: Progressing toward goals    Frequency    Min 1X/week      PT Plan      Co-evaluation PT/OT/SLP Co-Evaluation/Treatment: Yes Reason for Co-Treatment: To address functional/ADL  transfers PT goals addressed during session: Mobility/safety with mobility        AM-PAC PT "6 Clicks" Mobility   Outcome Measure  Help needed turning from your back to your side while in a flat bed without using bedrails?: A Little Help needed moving from lying on your back to sitting on the side of a flat bed without using bedrails?: A Little Help needed moving to and from a bed to a chair (including a wheelchair)?: A Lot Help needed standing up from a chair using your arms (e.g., wheelchair or bedside chair)?: A Lot Help needed to walk in hospital room?: A Lot Help needed climbing 3-5 steps with a railing? : Total 6 Click Score: 13    End of Session   Activity Tolerance: Patient tolerated treatment well Patient left: in bed;with call bell/phone within reach;with bed alarm set;with nursing/sitter in room Nurse Communication: Mobility status PT Visit Diagnosis: Unsteadiness on feet (R26.81);Muscle weakness (generalized) (M62.81);Other abnormalities of gait and mobility (R26.89);Difficulty in walking, not elsewhere classified (R26.2);History of falling (Z91.81)     Time: 9563-8756 PT Time Calculation (min) (ACUTE ONLY): 25 min  Charges:    $Therapeutic Activity: 8-22 mins PT General Charges $$ ACUTE PT VISIT: 1 Visit                     Hunter [name], PT, DPT Physical Therapist - [facility]  [facility]    Hunter [name] A [name] 11/15/2023, 3:41 PM

## 2023-11-15 NOTE — ED Notes (Signed)
 Patient attempting to get out of bed. Pt repositioned back into bed, and educated to not try to get up. Bed alarm re-set on bed.

## 2023-11-16 NOTE — ED Notes (Signed)
 Pt given breakfast tray, sat up in bed and positioned to eat.

## 2023-11-16 NOTE — ED Notes (Signed)
 Pt attempting to get out of bed multiple times over last 20 min. Pt now yelling and swearing at staff, not following instructions at this time.

## 2023-11-16 NOTE — ED Notes (Signed)
 Pt attempting to get out of bed, pt has taken off brief and thrown it towards the door. Pt placed in new brief.

## 2023-11-16 NOTE — TOC Progression Note (Addendum)
 Transition of Care [facility]) - Progression Note    Patient Details  Name: Hunter [name] MRN: [id number] Date of Birth: 1934-01-14  [facility]) CM/SW Contact  [name], RN Phone Number: 11/16/2023, 4:24 PM  Clinical Narrative:    [facility] @ [phone] and spoke with Ray Caffey (AD). They have 1 LTC male bed available and would like his info faxed to her at [phone]. FL2 and clinical notes faxed to the fax # above for Amelia to review. Will follow up with her tomorrow. TOC to continue to follow.    Expected Discharge Plan:  (TBD) Barriers to Discharge:  (finding an accepting facility)  Expected Discharge Plan and Services     Post Acute Care Choice:  (TBD) Living arrangements for the past 2 months: Skilled Nursing Facility                                       Social Determinants of Health (SDOH) Interventions SDOH Screenings   Food Insecurity: No Food Insecurity (06/13/2023)   Received from [facility]  Housing: Low Risk  (06/09/2023)  Transportation Needs: No Transportation Needs (06/13/2023)   Received from [facility]  Utilities: Low Risk  (06/13/2023)   [facility]  Alcohol Screen: Low Risk  (07/06/2021)  Depression (PHQ2-9): Medium Risk (11/26/2022)  Financial Resource Strain: Low Risk  (06/13/2023)   Received from [facility]  Physical Activity: Insufficiently Active (06/13/2023)   Received from [name]  Social Connections: Moderately Integrated (06/13/2023)   Received from [name]  Stress: No Stress Concern Present (06/13/2023)   Received from [facility]  Tobacco Use: Low Risk  (08/23/2023)  Recent Concern: Tobacco Use - Medium Risk (06/14/2023)   Received from [facility] Literacy: Medium Risk (06/13/2023)   Received from [facility]    Readmission Risk Interventions     No data to display

## 2023-11-16 NOTE — ED Notes (Signed)
 Pt sitting up in bed, lunch tray given

## 2023-11-16 NOTE — ED Notes (Signed)
 Pt finished eating dinner tray, ate about 25%. Pt has ice cream on gown, gown changed. Pt dry at this time.

## 2023-11-16 NOTE — ED Notes (Signed)
 Pt attempting to get out of bed, pt repositioned and asking for coke to drink. Pt given coke.

## 2023-11-16 NOTE — TOC CM/SW Note (Signed)
 [facility] called to inform CSW that they do not have  bed available.

## 2023-11-16 NOTE — ED Notes (Signed)
vol/toc placement.. 

## 2023-11-16 NOTE — ED Provider Notes (Signed)
 Emergency Medicine Observation Re-evaluation Note  Physical Exam   BP 116/60   Pulse 67   Temp 98.2 F (36.8 C)   Resp 17   Ht 6' (1.829 m)   Wt 97.5 kg   SpO2 97%   BMI 29.16 kg/m   Patient appears in no acute distress.  ED Course / MDM   No reported events during my shift at the time of this note.   Pt is awaiting dispo from consultants   Buell Carmin MD    Buell Carmin, MD 11/16/23 [phone]

## 2023-11-16 NOTE — ED Notes (Signed)
 Pt noted to be wet on rounding, pt cleaned of urine, new brief and bed sheet placed. New gown given, pt covered with warm blanket.

## 2023-11-17 MED ORDER — OLANZAPINE 10 MG IM SOLR
5.0000 mg | Freq: Once | INTRAMUSCULAR | Status: DC
  Filled 2023-11-17: qty 10

## 2023-11-17 NOTE — ED Notes (Signed)
 Pt provided with dinner tray and beverage at this time.

## 2023-11-17 NOTE — ED Provider Notes (Signed)
 Emergency Medicine Observation Re-evaluation Note  Hunter [name] is a 89 y.o. male, seen on rounds today.  Pt initially presented to the ED for complaints of TOC Placement  Currently, the patient is calm, no acute complaints.  Physical Exam  Blood pressure (!) 106/53, pulse 60, temperature 97.9 F (36.6 C), temperature source Oral, resp. rate 17, height 6' (1.829 m), weight 97.5 kg, SpO2 99%. Physical Exam General: NAD Lungs: CTAB Psych: not agitated  ED Course / MDM  EKG:    I have reviewed the labs performed to date as well as medications administered while in observation.  Recent changes in the last 24 hours include no acute events overnight.    Plan  Current plan is for TOC placement.   Jacquie Maudlin, MD 11/17/23 [phone]

## 2023-11-17 NOTE — ED Notes (Signed)
 Patients brief changed and sheet changed. Incontinent of urine. Patient pulled up in bed

## 2023-11-17 NOTE — ED Notes (Signed)
 RN taking over care of this pt who is still awaiting placement to a facility. Pt is currently resting peacefully in bed. Bed alarms on and audible. Pt ABCs intact. RR even and unlabored. Pt in NAD. Bed in lowest locked position.   Past Medical History:  Diagnosis Date   Allergic rhinitis 02/02/2007   Anemia    Arthritis    "knees" (02/08/2017)   Chronic lower back pain    CKD (chronic kidney disease), stage III (HCC) 05/27/2011   Complete heart block 02/08/2017   Eczema    Essential hypertension 02/02/2007   Lasix  60 mg, losartan  50mg ,  Amlodipine  5mg --> 2.5 mg.  Usually have to repeat BP measurements  In the past-Maxzide  37.5-25mg .   GERD (gastroesophageal reflux disease) occasional   Hearing loss of both ears    Heart failure with preserved ejection fraction (HFpEF) 12/06/2013   History of skin cancer 03/26/2014   nose    Hx of echocardiogram    Echo (07/2013): Mild LVH, EF 55-60%, grade 1 diastolic dysfunction, mild LAE, PASP 23   Hyperlipidemia    Hypertension    Hypothyroidism    LBBB (left bundle branch block)    Left patella fracture 2018   "no OR" (02/08/2017)   Major depression in partial remission 02/02/2007   Amitriptyline  25mg  for sleep (stop when runs out of #10 04/2018(, zoloft  100-->150mg --> 200mg    Mild neurocognitive disorder 06/21/2019   Neuropathy (HCC) 11/02/2011   Nocturia    Peripheral vascular disease (HCC) LOWER EXTREMITIES   Spinal stenosis in cervical region 03/21/2010   Squamous cell skin cancer, penis: glans (HCC) 05/2010   Initial excision 11/11; recurrence, excision and laser Rx 9/13   Thrombocytopenia (HCC) 05/23/2012   Urinary frequency 09/05/2009   Possible BPH- see Trudi Fus notes    Vitamin D  deficiency 08/15/2008

## 2023-11-17 NOTE — ED Notes (Signed)
 Patient attempting to get out of recliner (put in by mobility). Bed alarm is alarming at this time. Patient placed back in bed

## 2023-11-17 NOTE — ED Notes (Signed)
TOC Placement 

## 2023-11-17 NOTE — ED Notes (Signed)
 Mobility at bedside

## 2023-11-17 NOTE — TOC Progression Note (Signed)
 Transition of Care [facility]) - Progression Note    Patient Details  Name: Hunter [name] MRN: [id number] Date of Birth: [birth year]  Transition of [facility]) CM/SW Contact  Joslyn Nim, RN Phone Number: 11/17/2023, 11:12 AM  Clinical Narrative:     CM received a voice message from [place] at [facility], [phone]. Per voice mail they are not able to offer a bed. Cm called her back to find out the reason for the denial but CM had to leave a message.   Expected Discharge Plan:  (TBD) Barriers to Discharge:  (finding an accepting facility)  Expected Discharge Plan and Services     Post Acute Care Choice:  (TBD) Living arrangements for the past 2 months: Skilled Nursing Facility                                       Social Determinants of Health (SDOH) Interventions SDOH Screenings   Food Insecurity: No Food Insecurity (06/13/2023)   Received from [facility]  Housing: Low Risk  (06/09/2023)  Transportation Needs: No Transportation Needs (06/13/2023)   Received from [facility]  Utilities: Low Risk  (06/13/2023)   Received from [facility]  Alcohol Screen: Low Risk  (07/06/2021)  Depression (PHQ2-9): Medium Risk (11/26/2022)  Financial Resource Strain: Low Risk  (06/13/2023)   Received from [facility]  Physical Activity: Insufficiently Active (06/13/2023)   Received from [facility]  Social Connections: Moderately Integrated (06/13/2023)   Received from [facility]  Stress: No Stress Concern Present (06/13/2023)   Received from [facility]  Tobacco Use: Low Risk  (08/23/2023)  Recent Concern: Tobacco Use - Medium Risk (06/14/2023)   Received from [facility] Literacy: Medium Risk (06/13/2023)   Received from [facility]    Readmission Risk Interventions     No data to display

## 2023-11-17 NOTE — ED Notes (Signed)
 Patient eating breakfast tray at this time.

## 2023-11-17 NOTE — ED Notes (Signed)
 Patient is cussing, yelling, hitting and attempting to climb out of bed

## 2023-11-17 NOTE — Progress Notes (Signed)
 Mobility Specialist - Progress Note   11/17/23 1009  Mobility  Activity Dangled on edge of bed;Stood at bedside;Transferred from bed to chair  Level of Assistance +2 (takes two people) (MinA of two)  Press photographer wheel walker  Distance Ambulated (ft) 4 ft  Activity Response Tolerated well  Mobility visit 1 Mobility  Mobility Specialist Start Time (ACUTE ONLY) [id number]  Mobility Specialist Stop Time (ACUTE ONLY) [id number]  Mobility Specialist Time Calculation (min) (ACUTE ONLY) 16 min   Pt lying [place] upon entry, utilizing RA--- actively trying to exit the bed. Pt required redirection to task throughout the session. Pt required Min-ModA to bring BLE EOB. While seated EOB MS gives bath, dons socks and new gown. Pt STS to RW ModA +2, slight bend in the knees upon standing. Pt transferred to the recliner MinA +2 w/ cuing for upright posture. Pt left seated in the recliner with alarm set and needs within reach. RN notified.  Versa Gore Mobility Specialist 11/17/23 10:26 AM

## 2023-11-17 NOTE — ED Notes (Signed)
 TOC

## 2023-11-17 NOTE — ED Notes (Signed)
 Pt resting in bed at this time. Pt ABCs intact. RR even and unlabored. Pt in NAD. Bed in lowest locked position with alarms set.

## 2023-11-17 NOTE — ED Notes (Addendum)
 Pt is now clean after dumping dinner tray over his bed. Pt was wiped down from head to toe in warm bath wipes, moisturized with barrier cream, and provided with new adult brief, gown, padding, linens, and pillow. Pt positioned in comfort and finished cola before being tucked into bed with warm blankets. Pt ABCs intact. RR even and unlabored. Pt in NAD. Bed in lowest locked position with alarms set. Pt denies needs at this time.

## 2023-11-17 NOTE — ED Notes (Signed)
 This NT provided patient with a  shave and a shower. Nurse Amber assisted. Patient is back in bed. Bed alarm on.

## 2023-11-17 NOTE — ED Notes (Signed)
 Patient helped into recliner in room. Bed alarm pad in place and turned on. TV turned on for patient

## 2023-11-18 NOTE — Progress Notes (Signed)
 Physical Therapy Treatment Patient Details Name: Hunter [name] MRN: [id number] DOB: 1934/03/22 Today's Date: 11/18/2023   History of Present Illness Pt is an 89 y/o M admitted on 08/24/23 under IVC for aggression with staff & behavioral challenges (per psych, due to baseline dementia) at his facility. PMH: chronic LBP, CKD 3, essential HTN, HLD, L BBB, major depression, neuropathy, PVD.    PT Comments  Patient seen in conjunction with OT to maximize patient's tolerance. Patient pleasantly confused throughout session and agreeable to treatment session. Stood from Medical illustrator with CGA+2 and RW. Ambulated ~25' with Rw and minA + chair follow which is a significant improvement from previous sessions. With clearance from RN, patient taken outside for fresh air as patient has not been out of building (room without windows) in 86 days. Discharge plan remains appropriate.     If plan is discharge home, recommend the following: Assistance with cooking/housework;Direct supervision/assist for medications management;Assist for transportation;Help with stairs or ramp for entrance;Supervision due to cognitive status;A lot of help with walking and/or transfers;A lot of help with bathing/dressing/bathroom   Can travel by private vehicle     No  Equipment Recommendations  Rolling Abrish Erny (2 wheels);Wheelchair (measurements PT);Wheelchair cushion (measurements PT);BSC/3in1;Hospital bed    Recommendations for Other Services       Precautions / Restrictions Precautions Precautions: Fall Recall of Precautions/Restrictions: Impaired Precaution/Restrictions Comments: hx of restlessness, agitation and aggression. however cooperative, & pleasant with therapy Restrictions Weight Bearing Restrictions Per Provider Order: No     Mobility  Bed Mobility               General bed mobility comments: NT, pt recieved and left in recliner    Transfers Overall transfer level: Needs assistance Equipment used:  Rolling Brandin Stetzer (2 wheels) Transfers: Sit to/from Stand Sit to Stand: Contact guard assist, Min assist, +2 physical assistance           General transfer comment: pt improving in functional mobility this date, able to perform STS with CGA +2 first attempt, and second attempt MIN A +2 due to fatigue    Ambulation/Gait Ambulation/Gait assistance: Min assist, +2 safety/equipment Gait Distance (Feet): 25 Feet Assistive device: Rolling Jody Aguinaga (2 wheels) Gait Pattern/deviations: Trunk flexed, Step-through pattern, Decreased stride length Gait velocity: decreased     General Gait Details: assist for RW management and balance   Stairs             Wheelchair Mobility     Tilt Bed    Modified Rankin (Stroke Patients Only)       Balance Overall balance assessment: Needs assistance Sitting-balance support: Feet supported, No upper extremity supported Sitting balance-Leahy Scale: Fair     Standing balance support: Bilateral upper extremity supported, During functional activity Standing balance-Leahy Scale: Fair Standing balance comment: improved static standing balance for pericare, requires external UE support but is able to maintain standing with CGA - MIN A +1                            Communication Communication Communication: Impaired Factors Affecting Communication: Hearing impaired (better out of L ear)  Cognition Arousal: Alert Behavior During Therapy: WFL for tasks assessed/performed   PT - Cognitive impairments: History of cognitive impairments                         Following commands: Impaired Following commands impaired: Follows multi-step commands inconsistently  Cueing Cueing Techniques: Verbal cues, Tactile cues  Exercises Other Exercises Other Exercises: pt taken outside for recovery break after mobility in recliner    General Comments        Pertinent Vitals/Pain Pain Assessment Pain Assessment: No/denies pain     Home Living                          Prior Function            PT Goals (current goals can now be found in the care plan section) Acute Rehab PT Goals PT Goal Formulation: With patient Time For Goal Achievement: 11/24/23 Potential to Achieve Goals: Fair Progress towards PT goals: Progressing toward goals    Frequency    Min 1X/week      PT Plan      Co-evaluation PT/OT/SLP Co-Evaluation/Treatment: Yes Reason for Co-Treatment: To address functional/ADL transfers PT goals addressed during session: Mobility/safety with mobility OT goals addressed during session: ADL's and self-care      AM-PAC PT "6 Clicks" Mobility   Outcome Measure  Help needed turning from your back to your side while in a flat bed without using bedrails?: A Little Help needed moving from lying on your back to sitting on the side of a flat bed without using bedrails?: A Little Help needed moving to and from a bed to a chair (including a wheelchair)?: A Lot Help needed standing up from a chair using your arms (e.g., wheelchair or bedside chair)?: A Lot Help needed to walk in hospital room?: A Lot Help needed climbing 3-5 steps with a railing? : Total 6 Click Score: 13    End of Session Equipment Utilized During Treatment: Gait belt Activity Tolerance: Patient tolerated treatment well Patient left: in chair;with call bell/phone within reach;with chair alarm set Nurse Communication: Mobility status PT Visit Diagnosis: Unsteadiness on feet (R26.81);Muscle weakness (generalized) (M62.81);Other abnormalities of gait and mobility (R26.89);Difficulty in walking, not elsewhere classified (R26.2);History of falling (Z91.81)     Time: 1610-9604 PT Time Calculation (min) (ACUTE ONLY): 29 min  Charges:    $Therapeutic Activity: 8-22 mins PT General Charges $$ ACUTE PT VISIT: 1 Visit                     Janine Melbourne, PT, DPT Physical Therapist - [facility]  [facility]    Brylinn Teaney A Trany Chernick 11/18/2023, 2:00 PM

## 2023-11-18 NOTE — ED Notes (Signed)
 Pt sleeping peacefully in bed at this time. Pt ABCs intact. RR even and unlabored. Pt in NAD. Bed in lowest locked position with bed alarms set.

## 2023-11-18 NOTE — ED Notes (Signed)
 Pt brief changed at this time by NT Faith and RN Mansfield Seip. Pt pulled up in bed. Given blankets.

## 2023-11-18 NOTE — ED Notes (Signed)
 Pt sleeping peacefully in bed at this time. ABCs intact. RR even and unlabored. Pt in NAD. Bed in lowest locked position with alarms set.

## 2023-11-18 NOTE — ED Notes (Signed)
 Pt attempted to get out of bed. This RN, Mansfield Seip RN and [employer] tech attempted to help pt get to bed side recliner. Pt kicked [employer] and attempted to [employer] into staff. Pt assisted and repositioned in bed. Pt attempted to hit staff again. Fall alarm in place. NAD.

## 2023-11-18 NOTE — ED Notes (Signed)
 Pt ambulated to bed w gate belt and 2 wheel walker without incident. Fall alarm in place. CB within reach. Remote and warms blankets provided. nad

## 2023-11-18 NOTE — ED Notes (Signed)
 Assisted RN with getting pt back in bed.

## 2023-11-18 NOTE — ED Provider Notes (Signed)
 [facility] Observation Note   ----------------------------------------- 12:34 PM on 11/18/2023 -----------------------------------------  Hunter [name] is a 89 y.o. male currently boarding in the Emergency Department.  No acute events since last update.  Recent Vitals   Most recent vital signs: Vitals:   11/17/23 0730 11/18/23 0952  BP: (!) 106/53 138/68  Pulse: 60 64  Resp: 17 19  Temp: 97.9 F (36.6 C) 98.4 F (36.9 C)  SpO2: 99% 100%    ED Results / Procedures / Treatments   Labs (all labs ordered are listed, but only abnormal results are displayed) Labs Reviewed  GASTROINTESTINAL PANEL BY PCR, STOOL (REPLACES STOOL CULTURE) - Abnormal; Notable for the following components:      Result Value   Norovirus GI/GII DETECTED (*)    All other components within normal limits  URINE CULTURE - Abnormal; Notable for the following components:   Culture >=100,000 COLONIES/mL ESCHERICHIA COLI (*)    Organism ID, Bacteria ESCHERICHIA COLI (*)    All other components within normal limits  URINE CULTURE - Abnormal; Notable for the following components:   Culture >=100,000 COLONIES/mL ESCHERICHIA COLI (*)    Organism ID, Bacteria ESCHERICHIA COLI (*)    All other components within normal limits  GASTROINTESTINAL PANEL BY PCR, STOOL (REPLACES STOOL CULTURE) - Abnormal; Notable for the following components:   Enteropathogenic E coli (EPEC) DETECTED (*)    All other components within normal limits  CBC - Abnormal; Notable for the following components:   Platelets 134 (*)    All other components within normal limits  COMPREHENSIVE METABOLIC PANEL - Abnormal; Notable for the following components:   Potassium 3.2 (*)    Glucose, Bld 104 (*)    Creatinine, Ser 1.38 (*)    GFR, Estimated 49 (*)    All other components within normal limits  URINALYSIS, ROUTINE W REFLEX MICROSCOPIC - Abnormal; Notable for the following components:   Color, Urine YELLOW (*)     APPearance TURBID (*)    Hgb urine dipstick SMALL (*)    Protein, ur 100 (*)    Nitrite POSITIVE (*)    Leukocytes,Ua MODERATE (*)    Bacteria, UA MANY (*)    All other components within normal limits  BASIC METABOLIC PANEL - Abnormal; Notable for the following components:   Glucose, Bld 103 (*)    BUN 27 (*)    Creatinine, Ser 1.49 (*)    GFR, Estimated 45 (*)    All other components within normal limits  URINALYSIS, W/ REFLEX TO CULTURE (INFECTION SUSPECTED) - Abnormal; Notable for the following components:   Color, Urine YELLOW (*)    APPearance TURBID (*)    Hgb urine dipstick MODERATE (*)    Protein, ur 100 (*)    Leukocytes,Ua MODERATE (*)    Bacteria, UA FEW (*)    All other components within normal limits  BASIC METABOLIC PANEL - Abnormal; Notable for the following components:   Glucose, Bld 104 (*)    Creatinine, Ser 1.34 (*)    Calcium  8.7 (*)    GFR, Estimated 51 (*)    All other components within normal limits  VALPROIC ACID  LEVEL - Abnormal; Notable for the following components:   Valproic Acid  Lvl 21 (*)    All other components within normal limits  VALPROIC ACID  LEVEL - Abnormal; Notable for the following components:   Valproic Acid  Lvl 22 (*)    All other components within normal limits  URINALYSIS, ROUTINE W  REFLEX MICROSCOPIC - Abnormal; Notable for the following components:   Color, Urine YELLOW (*)    APPearance TURBID (*)    Hgb urine dipstick MODERATE (*)    Protein, ur 30 (*)    Leukocytes,Ua LARGE (*)    Bacteria, UA MANY (*)    All other components within normal limits  CBC WITH DIFFERENTIAL/PLATELET - Abnormal; Notable for the following components:   Hemoglobin 12.0 (*)    HCT 36.2 (*)    Platelets 103 (*)    All other components within normal limits  BASIC METABOLIC PANEL - Abnormal; Notable for the following components:   Glucose, Bld 123 (*)    BUN 27 (*)    Creatinine, Ser 1.35 (*)    Calcium  8.7 (*)    GFR, Estimated 51 (*)    All  other components within normal limits  VALPROIC ACID  LEVEL - Abnormal; Notable for the following components:   Valproic Acid  Lvl 24 (*)    All other components within normal limits  BASIC METABOLIC PANEL WITH GFR - Abnormal; Notable for the following components:   Glucose, Bld 112 (*)    Creatinine, Ser 1.49 (*)    Calcium  8.8 (*)    GFR, Estimated 45 (*)    All other components within normal limits  VALPROIC ACID  LEVEL - Abnormal; Notable for the following components:   Valproic Acid  Lvl 43 (*)    All other components within normal limits  CBC WITH DIFFERENTIAL/PLATELET - Abnormal; Notable for the following components:   Hemoglobin 12.2 (*)    HCT 35.9 (*)    MCV 77.5 (*)    RDW 15.6 (*)    Platelets 104 (*)    All other components within normal limits  LACTIC ACID, PLASMA - Abnormal; Notable for the following components:   Lactic Acid, Venous 2.1 (*)    All other components within normal limits  COMPREHENSIVE METABOLIC PANEL WITH GFR - Abnormal; Notable for the following components:   Potassium 3.3 (*)    Glucose, Bld 110 (*)    BUN 37 (*)    Creatinine, Ser 1.49 (*)    Calcium  8.7 (*)    Total Protein 5.1 (*)    Albumin 3.1 (*)    GFR, Estimated 45 (*)    All other components within normal limits  CBC WITH DIFFERENTIAL/PLATELET - Abnormal; Notable for the following components:   Hemoglobin 12.4 (*)    HCT 37.2 (*)    Platelets 83 (*)    All other components within normal limits  URINALYSIS, W/ REFLEX TO CULTURE (INFECTION SUSPECTED) - Abnormal; Notable for the following components:   Color, Urine YELLOW (*)    APPearance CLEAR (*)    All other components within normal limits  LACTIC ACID, PLASMA - Abnormal; Notable for the following components:   Lactic Acid, Venous 2.2 (*)    All other components within normal limits  COMPREHENSIVE METABOLIC PANEL WITH GFR - Abnormal; Notable for the following components:   Glucose, Bld 118 (*)    BUN 34 (*)    Creatinine, Ser 1.57  (*)    Calcium  8.8 (*)    Total Protein 5.4 (*)    Albumin 3.2 (*)    GFR, Estimated 42 (*)    All other components within normal limits  TROPONIN I (HIGH SENSITIVITY) - Abnormal; Notable for the following components:   Troponin I (High Sensitivity) 34 (*)    All other components within normal limits  TROPONIN I (HIGH  SENSITIVITY) - Abnormal; Notable for the following components:   Troponin I (High Sensitivity) 30 (*)    All other components within normal limits  RESP PANEL BY RT-PCR (RSV, FLU A&B, COVID)  RVPGX2  RESP PANEL BY RT-PCR (RSV, FLU A&B, COVID)  RVPGX2  CULTURE, BLOOD (ROUTINE X 2)  CULTURE, BLOOD (ROUTINE X 2)  AMMONIA  MAGNESIUM   VALPROIC ACID  LEVEL  LACTIC ACID, PLASMA  LACTIC ACID, PLASMA  CBC WITH DIFFERENTIAL/PLATELET    MEDICATIONS ORDERED IN ED: Medications  OLANZapine  (ZYPREXA ) tablet 2.5 mg (2.5 mg Oral Given 11/17/23 2232)  OLANZapine  (ZYPREXA ) injection 5 mg (5 mg Intramuscular Given 11/17/23 1314)  albuterol  (PROVENTIL ) (2.5 MG/3ML) 0.083% nebulizer solution 2.5 mg (has no administration in time range)  amLODipine  (NORVASC ) tablet 2.5 mg (2.5 mg Oral Given 11/18/23 0944)  aspirin  chewable tablet 81 mg (81 mg Oral Given 11/18/23 0944)  atorvastatin  (LIPITOR) tablet 20 mg (20 mg Oral Given 11/17/23 2232)  cyanocobalamin  (VITAMIN B12) injection 1,000 mcg (1,000 mcg Intramuscular Given 10/22/23 1107)  ferrous sulfate  tablet 325 mg (325 mg Oral Given 11/18/23 0943)  furosemide  (LASIX ) tablet 80 mg (80 mg Oral Given 11/18/23 0945)  levothyroxine  (SYNTHROID ) tablet 75 mcg (75 mcg Oral Given 11/18/23 0945)  losartan  (COZAAR ) tablet 50 mg (50 mg Oral Given 11/18/23 0945)  pantoprazole  (PROTONIX ) EC tablet 40 mg (40 mg Oral Given 11/18/23 0944)  OLANZapine  (ZYPREXA ) tablet 2.5 mg (2.5 mg Oral Given 11/18/23 0943)  sertraline  (ZOLOFT ) tablet 150 mg (150 mg Oral Given 11/18/23 0943)  acetaminophen  (TYLENOL ) tablet 650 mg (650 mg Oral Given 11/06/23 0407)  ziprasidone  (GEODON ) 20 MG  injection (  Not Given 10/09/23 1409)  divalproex  (DEPAKOTE ) DR tablet 500 mg (500 mg Oral Given 11/18/23 0944)  OLANZapine  (ZYPREXA ) injection 5 mg (5 mg Intramuscular Not Given 11/17/23 1545)  potassium chloride  SA (KLOR-CON  M) CR tablet 40 mEq (40 mEq Oral Given 08/24/23 0205)  LORazepam  (ATIVAN ) tablet 1 mg (1 mg Oral Given 08/26/23 0247)  LORazepam  (ATIVAN ) tablet 1 mg (1 mg Oral Given 09/01/23 1611)  acetaminophen  (TYLENOL ) tablet 1,000 mg (1,000 mg Oral Given 09/02/23 0342)  cephALEXin  (KEFLEX ) capsule 500 mg (500 mg Oral Given 09/29/23 0819)  ziprasidone  (GEODON ) injection 20 mg (20 mg Intramuscular Given 09/25/23 2115)  midazolam  PF (VERSED ) injection 2 mg (2 mg Nasal Given 09/26/23 0126)  midazolam  (VERSED ) injection 2 mg (2 mg Nasal Given 09/26/23 1455)  LORazepam  (ATIVAN ) tablet 2 mg (2 mg Oral Given 09/26/23 2020)  midazolam  (VERSED ) injection 2 mg (2 mg Intramuscular Given by Other 09/28/23 2110)  midazolam  (VERSED ) injection 2 mg (2 mg Intramuscular Not Given 09/29/23 0935)  midazolam  (VERSED ) injection 2 mg (2 mg Intramuscular Given 09/29/23 1803)  haloperidol  lactate (HALDOL ) injection 5 mg (5 mg Intramuscular Given 10/03/23 1948)  ciprofloxacin  (CIPRO ) tablet 500 mg (500 mg Oral Given 10/10/23 2008)  LORazepam  (ATIVAN ) injection 2 mg (2 mg Intramuscular Given 10/05/23 1739)  haloperidol  lactate (HALDOL ) injection 5 mg (5 mg Intramuscular Given 10/05/23 1739)  diphenhydrAMINE  (BENADRYL ) injection 50 mg (50 mg Intramuscular Given 10/05/23 1739)  lidocaine  (XYLOCAINE ) 2 % jelly 1 Application (1 Application Topical Given 10/08/23 0636)  midazolam  (VERSED ) injection 2 mg (2 mg Intramuscular Given 10/09/23 0241)  ziprasidone  (GEODON ) injection 10 mg (10 mg Intramuscular Given 10/09/23 1408)  LORazepam  (ATIVAN ) injection 1 mg (1 mg Intramuscular Given 10/10/23 1618)  midazolam  PF (VERSED ) injection 2 mg (2 mg Nasal Given 10/10/23 2150)  LORazepam  (ATIVAN ) injection 2 mg (2 mg Intramuscular Given 10/11/23  1635)  ziprasidone  (GEODON ) injection 10 mg (10 mg Intramuscular Given 10/12/23 0040)  midazolam  (VERSED ) injection 2 mg (2 mg Intramuscular Given by Other 10/13/23 0900)  LORazepam  (ATIVAN ) injection 1 mg (1 mg Intramuscular Given 10/17/23 2132)  OLANZapine  (ZYPREXA ) tablet 5 mg (5 mg Oral Given 10/20/23 1550)  LORazepam  (ATIVAN ) tablet 1 mg (1 mg Oral Given 10/22/23 0127)  lactated ringers  bolus 1,000 mL (0 mLs Intravenous Stopped 10/30/23 0243)  OLANZapine  (ZYPREXA ) tablet 5 mg (5 mg Oral Given 11/11/23 1522)  LORazepam  (ATIVAN ) tablet 1 mg (1 mg Oral Given 11/15/23 1105)     ED Plan   Currently awaiting placement into an appropriate living facility.  Social work is working with the patient to help achieve this.      Ruth Cove, MD 11/18/23 1234

## 2023-11-18 NOTE — TOC Progression Note (Addendum)
 Transition of Care [facility]) - Progression Note    Patient Details  Name: Hunter [name] MRN: [id number] Date of Birth: [birth year]  Transition of Care [facility]) CM/SW Contact  [name], RN Phone Number: 11/18/2023, 9:24 AM  Clinical Narrative:    Clinical notes, FL2 faxed to [facility] at [facility] on 11/16/23. Called and it went to voicemail, left a message requesting a call back. 1530: Amelia from [facility] called back and they are declining him due to behaviors. TOC to continue to follow.    Expected Discharge Plan:  (TBD) Barriers to Discharge:  (finding an accepting facility)  Expected Discharge Plan and Services     Post Acute Care Choice:  (TBD) Living arrangements for the past 2 months: Skilled Nursing Facility                                       Social Determinants of Health (SDOH) Interventions SDOH Screenings   Food Insecurity: No Food Insecurity (06/13/2023)   Received from [facility]  Housing: Low Risk  (06/09/2023)  Transportation Needs: No Transportation Needs (06/13/2023)   Received from [facility]  Utilities: Low Risk  (06/13/2023)   Received from [facility]  Alcohol Screen: Low Risk  (07/06/2021)  Depression (PHQ2-9): Medium Risk (11/26/2022)  Financial Resource Strain: Low Risk  (06/13/2023)   Received from [facility]  Physical Activity: Insufficiently Active (06/13/2023)   Received from [facility]  Social Connections: Moderately Integrated (06/13/2023)   Received from [facility]  Stress: No Stress Concern Present (06/13/2023)   Received from [facility]  Tobacco Use: Low Risk  (08/23/2023)  Recent Concern: Tobacco Use - Medium Risk (06/14/2023)   Received from [facility] Literacy: Medium Risk (06/13/2023)   Received from [facility]    Readmission Risk Interventions     No data to display

## 2023-11-18 NOTE — Progress Notes (Signed)
 Occupational Therapy Treatment Patient Details Name: Hunter [name] MRN: [id number] DOB: 1934-05-07 Today's Date: 11/18/2023   History of present illness Pt is an 89 y/o M admitted on 08/24/23 under IVC for aggression with staff & behavioral challenges (per psych, due to baseline dementia) at his facility. PMH: chronic LBP, CKD 3, essential HTN, HLD, L BBB, major depression, neuropathy, PVD.   OT comments  Pt seen today with PT for co-tx to maximize therapeutic outcomes. Pt pleasantly confused, and is eager for mobility this date. Improved ability to comprehend and follow commands. Overall, requires CGA +2 to perform sit<>stand from recliner, and completes functional mobility 25 ft in hallway using RW with chair follow for safety which is a significant improvement as pt was MAX A +2 last session to perform sit<>stands. UB dressing with MIN A seated in recliner, MAX A for donning brief and TOTAL A for pericare. Pt making progress towards goals - discharge recommendation remains appropriate. Pt will require skilled 24/7 assist at next LOC due to deficits in cognition, ADLs and mobility.       If plan is discharge home, recommend the following:  Supervision due to cognitive status;Assist for transportation;Assistance with cooking/housework;Help with stairs or ramp for entrance;Two people to help with walking and/or transfers;Direct supervision/assist for financial management;Direct supervision/assist for medications management;Two people to help with bathing/dressing/bathroom   Equipment Recommendations  Other (comment)       Precautions / Restrictions Precautions Precautions: Fall Recall of Precautions/Restrictions: Impaired Precaution/Restrictions Comments: hx of restlessness, agitation and aggression. however cooperative, & pleasant with therapy Restrictions Weight Bearing Restrictions Per Provider Order: No       Mobility Bed Mobility               General bed mobility comments:  NT, pt recieved and left in recliner    Transfers Overall transfer level: Needs assistance Equipment used: Rolling walker (2 wheels) Transfers: Sit to/from Stand Sit to Stand: Contact guard assist, Min assist, +2 physical assistance           General transfer comment: pt improving in functional mobility this date, able to perform STS with CGA +2 first attempt, and second attempt MIN A +2 due to fatigue     Balance Overall balance assessment: Needs assistance Sitting-balance support: Feet supported, No upper extremity supported Sitting balance-Leahy Scale: Fair     Standing balance support: Bilateral upper extremity supported, During functional activity Standing balance-Leahy Scale: Fair Standing balance comment: improved static standing balance for pericare, requires external UE support but is able to maintain standing with CGA - MIN A +1                           ADL either performed or assessed with clinical judgement   ADL Overall ADL's : Needs assistance/impaired Eating/Feeding: Sitting;Set up Eating/Feeding Details (indicate cue type and reason): able to drink milk when OT opens straw and places in container             Upper Body Dressing : Sitting;Minimal assistance Upper Body Dressing Details (indicate cue type and reason): improved sitting balance, able to follow cues to don and doff gown Lower Body Dressing: Sit to/from stand;Maximal assistance;Sitting/lateral leans Lower Body Dressing Details (indicate cue type and reason): pt able to perfom lateral leans seated for brief to be fastened     Toileting- Clothing Manipulation and Hygiene: Maximal assistance;Sit to/from stand Toileting - Clothing Manipulation Details (indicate cue type and reason): pericare  in standing, +2 assist (one required for static balance and one for pericare)     Functional mobility during ADLs: +2 for physical assistance;Rolling walker (2 wheels);Cueing for sequencing;Cueing  for safety;Minimal assistance;Contact guard assist       Communication Communication Communication: Impaired Factors Affecting Communication: Hearing impaired (better out of L ear)   Cognition Arousal: Alert Behavior During Therapy: WFL for tasks assessed/performed Cognition: History of cognitive impairments, Cognition impaired   Orientation impairments: Place, Time, Situation Awareness: Intellectual awareness impaired Memory impairment (select all impairments): Short-term memory, Working [employer] fast streamer, Non-declarative long-term memory, Geneticist, molecular long-term memory Attention impairment (select first level of impairment): Focused attention Executive functioning impairment (select all impairments): Initiation, Organization, Sequencing, Reasoning, Problem solving OT - Cognition Comments: pt very motivated, conversational, pleasantly confused                 Following commands: Impaired Following commands impaired: Follows multi-step commands inconsistently      Cueing   Cueing Techniques: Verbal cues, Tactile cues  Exercises Other Exercises Other Exercises: pt taken outside for recovery break after mobility in recliner            Pertinent Vitals/ Pain       Pain Assessment Pain Assessment: No/denies pain Pain Score: 0-No pain   Frequency  Min 2X/week        Progress Toward Goals  OT Goals(current goals can now be found in the care plan section)  Progress towards OT goals: Progressing toward goals  Acute Rehab OT Goals OT Goal Formulation: Patient unable to participate in goal setting Time For Goal Achievement: 12/16/23 Potential to Achieve Goals: Fair ADL Goals Pt Will Perform Grooming: sitting;with contact guard assist Pt Will Perform Upper Body Bathing: sitting;with set-up Pt Will Perform Lower Body Bathing: with min assist;sit to/from stand Pt Will Perform Upper Body Dressing: with min assist;sitting Pt Will Perform Lower Body Dressing: with min assist;sit  to/from stand Pt Will Transfer to Toilet: with +2 assist;squat pivot transfer;with mod assist Pt Will Perform Toileting - Clothing Manipulation and hygiene: with max assist;sitting/lateral leans;bed level  Plan      Co-evaluation    PT/OT/SLP Co-Evaluation/Treatment: Yes Reason for Co-Treatment: To address functional/ADL transfers PT goals addressed during session: Mobility/safety with mobility OT goals addressed during session: ADL's and self-care      AM-PAC OT "6 Clicks" Daily Activity     Outcome Measure   Help from another person eating meals?: None Help from another person taking care of personal grooming?: A Little Help from another person toileting, which includes using toliet, bedpan, or urinal?: A Lot Help from another person bathing (including washing, rinsing, drying)?: A Lot Help from another person to put on and taking off regular upper body clothing?: A Lot Help from another person to put on and taking off regular lower body clothing?: A Lot 6 Click Score: 15    End of Session Equipment Utilized During Treatment: Gait belt;Rolling walker (2 wheels)  OT Visit Diagnosis: Unsteadiness on feet (R26.81);Repeated falls (R29.6);Muscle weakness (generalized) (M62.81)   Activity Tolerance Patient tolerated treatment well   Patient Left in chair;with chair alarm set (direct line of sight from RN station (pt does not comprehend use of call bell))   Nurse Communication Mobility status        Time: 1478-2956 OT Time Calculation (min): 31 min  Charges: OT General Charges $OT Visit: 1 Visit OT Treatments $Self Care/Home Management : 8-22 mins  Noreene Boreman L. Nilo Fallin, OTR/L  11/18/23, 12:43 PM

## 2023-11-18 NOTE — ED Notes (Signed)
 Pt assisted back to bed with two person assist. Pt tolerated well.

## 2023-11-18 NOTE — ED Notes (Signed)
 Full linen change provided by this RN

## 2023-11-19 DIAGNOSIS — F03918 Unspecified dementia, unspecified severity, with other behavioral disturbance: Secondary | ICD-10-CM | POA: Diagnosis not present

## 2023-11-19 LAB — AMMONIA: Ammonia: 18 umol/L (ref 9–35)

## 2023-11-19 LAB — COMPREHENSIVE METABOLIC PANEL WITH GFR
ALT: 32 U/L (ref 0–44)
AST: 45 U/L — ABNORMAL HIGH (ref 15–41)
Albumin: 3 g/dL — ABNORMAL LOW (ref 3.5–5.0)
Alkaline Phosphatase: 70 U/L (ref 38–126)
Anion gap: 10 (ref 5–15)
BUN: 32 mg/dL — ABNORMAL HIGH (ref 8–23)
CO2: 24 mmol/L (ref 22–32)
Calcium: 8.4 mg/dL — ABNORMAL LOW (ref 8.9–10.3)
Chloride: 100 mmol/L (ref 98–111)
Creatinine, Ser: 1.69 mg/dL — ABNORMAL HIGH (ref 0.61–1.24)
GFR, Estimated: 39 mL/min — ABNORMAL LOW (ref >60)
Glucose, Bld: 88 mg/dL (ref 70–99)
Potassium: 3.2 mmol/L — ABNORMAL LOW (ref 3.5–5.1)
Sodium: 134 mmol/L — ABNORMAL LOW (ref 135–145)
Total Bilirubin: 0.6 mg/dL (ref 0.0–1.2)
Total Protein: 5.2 g/dL — ABNORMAL LOW (ref 6.5–8.1)

## 2023-11-19 LAB — MAGNESIUM: Magnesium: 2 mg/dL (ref 1.7–2.4)

## 2023-11-19 MED ORDER — LORAZEPAM 2 MG/ML IJ SOLN: 1.0000 mg | Freq: Four times a day (QID) | INTRAMUSCULAR | Status: DC | PRN

## 2023-11-19 MED ORDER — LORAZEPAM 1 MG PO TABS
1.0000 mg | ORAL_TABLET | Freq: Four times a day (QID) | ORAL | Status: DC | PRN
  Administered 2023-11-20: 1 mg via ORAL
  Filled 2023-11-19: qty 1

## 2023-11-19 MED ORDER — OLANZAPINE 5 MG PO TABS
5.0000 mg | ORAL_TABLET | Freq: Every day | ORAL | Status: DC
  Administered 2023-11-19: 5 mg via ORAL
  Filled 2023-11-19: qty 1

## 2023-11-19 MED ORDER — ZIPRASIDONE MESYLATE 20 MG IM SOLR
10.0000 mg | Freq: Once | INTRAMUSCULAR | Status: AC
  Administered 2023-11-19: 10 mg via INTRAMUSCULAR
  Filled 2023-11-19: qty 20

## 2023-11-19 MED ORDER — OLANZAPINE 5 MG PO TABS
5.0000 mg | ORAL_TABLET | ORAL | Status: DC
  Administered 2023-11-20: 5 mg via ORAL
  Filled 2023-11-19: qty 1

## 2023-11-19 NOTE — ED Provider Notes (Signed)
-----------------------------------------   5:08 AM on 11/19/2023 -----------------------------------------   Blood pressure 96/61, pulse 67, temperature 98.1 F (36.7 C), resp. rate 16, height 1.829 m (6'), weight 97.5 kg, SpO2 100%.  The patient is calm and cooperative at this time.  There have been no acute events since the last update.  Awaiting disposition plan from [facility] team.   Lynnda Sas, MD 11/19/23 [phone]

## 2023-11-19 NOTE — ED Notes (Signed)
 Pt had bowel movement. Pt cleaned, sheets changed. Pt tolerated well. NADN.

## 2023-11-19 NOTE — ED Notes (Signed)
 Pt provided with water  and lemon lime soda, no other comfort measures requested at this time.

## 2023-11-19 NOTE — ED Notes (Signed)
 This NT changed pt after having bowel movement. Pt provided with new gown, chux, and brief. A second brief was provided due to pt urinating during bed change.

## 2023-11-19 NOTE — ED Notes (Signed)
 High fall risk bundle put in place

## 2023-11-19 NOTE — ED Notes (Signed)
 Siadecki, MD, made aware that patient is agitated and aggressive. PRN medication requested.

## 2023-11-19 NOTE — ED Notes (Signed)
 Pt assisted back to bed. Pt tolerated well. Pt provided with water . No other comfort measures requested at this time.

## 2023-11-19 NOTE — ED Notes (Signed)
 This NT and nurse Franki Isles gave patient a complete bed bath. Patient was wet. Patient has on clean linen, clean chux, clean brief and a clean gown. Patient is resting comfortably at this time.

## 2023-11-19 NOTE — TOC CM/SW Note (Signed)
 Phone calls and faxes to facilities inquiring about memory care unit.

## 2023-11-19 NOTE — ED Notes (Signed)
 Pt had bowel movement. Pt cleaned, gown changed. Pt tolerated well.

## 2023-11-19 NOTE — Progress Notes (Signed)
 Mobility Specialist - Progress Note   11/19/23 1115  Mobility  Activity Dangled on edge of bed;Stood at bedside;Ambulated with assistance in hallway;Ambulated with assistance in room  Level of Assistance Minimal assist, patient does 75% or more  Assistive Device Front wheel walker  Distance Ambulated (ft) 20 ft  Activity Response Tolerated well  Mobility visit 1 Mobility  Mobility Specialist Start Time (ACUTE ONLY) 0915  Mobility Specialist Stop Time (ACUTE ONLY) 0948  Mobility Specialist Time Calculation (min) (ACUTE ONLY) 33 min   Pt seated EOB upon entry, utilizing RA. Pt STS to RW MaxA +2, stood at the bedside for ~1 min as Pt was unable to come to full standing--- Bilateral knee flexion, returned EOB. Pt STS to RW MinA +1, amb ~20 ft within the room and into the hallway with MinA +1 and close chair follow. Pt sat in the recliner for a seated rest break before amb an additional 2 steps. Pt returned to the recliner d/t active BM during amb. Pt wheeled into the room, MS doffs soiled brief and completes peri care. Pt STS to RW MinA +2, MS dons clean brief and transfers to the EOB. Pt left in fowler position with alarm set and needs within reach.  Hunter [name] Mobility Specialist 11/19/23 11:41 AM
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
## 2023-11-19 NOTE — ED Notes (Signed)
TOC placement 

## 2023-11-20 DIAGNOSIS — F03918 Unspecified dementia, unspecified severity, with other behavioral disturbance: Secondary | ICD-10-CM | POA: Diagnosis not present

## 2023-11-20 LAB — URINALYSIS, COMPLETE (UACMP) WITH MICROSCOPIC
Bacteria, UA: NONE SEEN
Bilirubin Urine: NEGATIVE
Glucose, UA: NEGATIVE mg/dL
Hgb urine dipstick: NEGATIVE
Ketones, ur: NEGATIVE mg/dL
Nitrite: NEGATIVE
Protein, ur: NEGATIVE mg/dL
Specific Gravity, Urine: 1.009 (ref 1.005–1.030)
Squamous Epithelial / HPF: 0 /HPF (ref 0–5)
pH: 6 (ref 5.0–8.0)

## 2023-11-20 LAB — CBC WITH DIFFERENTIAL/PLATELET
Abs Immature Granulocytes: 0.02 10*3/uL (ref 0.00–0.07)
Basophils Absolute: 0 10*3/uL (ref 0.0–0.1)
Basophils Relative: 0 %
Eosinophils Absolute: 0.1 10*3/uL (ref 0.0–0.5)
Eosinophils Relative: 1 %
HCT: 34.3 % — ABNORMAL LOW (ref 39.0–52.0)
Hemoglobin: 11.4 g/dL — ABNORMAL LOW (ref 13.0–17.0)
Immature Granulocytes: 0 %
Lymphocytes Relative: 40 %
Lymphs Abs: 2.1 10*3/uL (ref 0.7–4.0)
MCH: 27 pg (ref 26.0–34.0)
MCHC: 33.2 g/dL (ref 30.0–36.0)
MCV: 81.3 fL (ref 80.0–100.0)
Monocytes Absolute: 0.5 10*3/uL (ref 0.1–1.0)
Monocytes Relative: 9 %
Neutro Abs: 2.5 10*3/uL (ref 1.7–7.7)
Neutrophils Relative %: 50 %
Platelets: 74 10*3/uL — ABNORMAL LOW (ref 150–400)
RBC: 4.22 MIL/uL (ref 4.22–5.81)
RDW: 15.3 % (ref 11.5–15.5)
WBC: 5.1 10*3/uL (ref 4.0–10.5)
nRBC: 0 % (ref 0.0–0.2)

## 2023-11-20 LAB — VALPROIC ACID LEVEL: Valproic Acid Lvl: 58 ug/mL (ref 50–100)

## 2023-11-20 MED ORDER — OLANZAPINE 5 MG PO TBDP
5.0000 mg | ORAL_TABLET | Freq: Three times a day (TID) | ORAL | Status: DC
  Administered 2023-11-21 – 2023-11-22 (×7): 5 mg via ORAL
  Filled 2023-11-20 (×10): qty 1

## 2023-11-20 MED ORDER — OLANZAPINE 10 MG IM SOLR
5.0000 mg | Freq: Once | INTRAMUSCULAR | Status: AC
  Administered 2023-11-20: 5 mg via INTRAMUSCULAR
  Filled 2023-11-20: qty 10

## 2023-11-20 MED ORDER — LORAZEPAM 2 MG PO TABS
2.0000 mg | ORAL_TABLET | Freq: Four times a day (QID) | ORAL | Status: DC | PRN
  Administered 2023-11-21 – 2023-11-22 (×2): 2 mg via ORAL
  Filled 2023-11-20 (×2): qty 1

## 2023-11-20 MED ORDER — CLONAZEPAM 0.25 MG PO TBDP
0.5000 mg | ORAL_TABLET | Freq: Two times a day (BID) | ORAL | Status: DC
  Administered 2023-11-20 – 2023-11-24 (×7): 0.5 mg via ORAL
  Filled 2023-11-20 (×8): qty 2

## 2023-11-20 MED ORDER — DIVALPROEX SODIUM 125 MG PO CSDR
750.0000 mg | DELAYED_RELEASE_CAPSULE | Freq: Two times a day (BID) | ORAL | Status: DC
  Administered 2023-11-20 – 2023-11-22 (×6): 750 mg via ORAL
  Filled 2023-11-20 (×9): qty 6

## 2023-11-20 MED ORDER — LORAZEPAM 2 MG/ML IJ SOLN
2.0000 mg | Freq: Four times a day (QID) | INTRAMUSCULAR | Status: DC | PRN
  Administered 2023-11-23 – 2023-11-24 (×3): 2 mg via INTRAMUSCULAR
  Filled 2023-11-20 (×3): qty 1

## 2023-11-20 NOTE — ED Notes (Signed)
 Pt ambulated to bedside chair with walker and x2 assist. Pt bed linens changed, New chux placed, and bed wiped down. Pt sitting comfortably in chair. Alert and moderately agitated at this time.

## 2023-11-20 NOTE — ED Notes (Addendum)
 Pt attempting to get out of the bed. Able to redirect pt to scoot up in the bed but any movement to help causes pt to become agitated and he swings on staff. Unable to obtain urine sample at this time.

## 2023-11-20 NOTE — ED Notes (Signed)
Patient is vol pending TOC placement 

## 2023-11-20 NOTE — ED Notes (Signed)
 Pt soiled at this time. Brief changed. Peri-care preformed.

## 2023-11-20 NOTE — ED Notes (Addendum)
 Pt soiled at this time. Brief, linens, and chux pad replaced. Peri-care preformed. Pt given warm blanket.

## 2023-11-20 NOTE — ED Notes (Signed)
 Pt placed back in the bed at this time with assistance from NT Alexia. Warm blanket and ice water  provided.

## 2023-11-20 NOTE — ED Notes (Signed)
 Pt had a BM at this time. Pt brief changed, chux pad replaced, peri care preformed. NT Mansfield Seip assisted this RN.

## 2023-11-20 NOTE — ED Notes (Signed)
 Pt had a bowel movement. This RN and Melissa NT cleaned pt and put a new brief on. Pt is resting comfortably.
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
## 2023-11-20 NOTE — Consult Note (Signed)
 [facility] Psychiatric Consult Follow up  Patient Name: .Hunter [name]  MRN: [id number]  DOB: 23-Apr-1934  Consult Order details:  Orders (From admission, onward)     Start     Ordered   11/19/23 1501  IP CONSULT TO PSYCHIATRY       Ordering Provider: Lind Repine, MD  Provider:  (Not yet assigned)  Question Answer Comment  Place call to: Psych NP   Reason for Consult Consult      11/19/23 1500   10/15/23 1359  IP CONSULT TO PSYCHIATRY       Ordering Provider: Viviano Ground, MD  Provider:  (Not yet assigned)  Question Answer Comment  Consult Timeframe URGENT - requires response within 12 hours   URGENT timeframe requires provider to provider communication, has the provider to provider communication been completed Yes   Reason for Consult? Consult for medication management   Contact phone number where the requesting provider can be reached [phone]      10/15/23 1358   10/09/23 0238  IP CONSULT TO PSYCHIATRY       Ordering Provider: Dewitt Forehand, DO  Provider:  (Not yet assigned)  Question Answer Comment  Consult Timeframe URGENT - requires response within 12 hours   URGENT timeframe requires provider to provider communication, has the provider to provider communication been completed Yes   Reason for Consult? Consult for medication management due to increasing agitation, not improving withy last medication adjustments 10/04/23   Contact phone number where the requesting provider can be reached [phone]      10/09/23 0238   10/08/23 1128  IP CONSULT TO PSYCHIATRY       Comments: Pt depakote  level being checked and psych needs to review/manage meds as agitation has continued.  Ordering Provider: Shane Darling, MD  Provider:  (Not yet assigned)  Question Answer Comment  Place call to: psych   Reason for Consult Other (See Comments) Medication mgmt  Diagnosis/Clinical Info for Consult: continued aggressive behavior      10/08/23 1132   08/24/23 0045  IP CONSULT TO  PSYCHIATRY       Ordering Provider: Norlene Beavers, MD  Provider:  (Not yet assigned)  Question Answer Comment  Place call to: psychiatry   Reason for Consult Consult   Diagnosis/Clinical Info for Consult: aggressive behavior      08/24/23 0044             Mode of Visit: In person    Psychiatry Consult Evaluation  Service Date: Nov 20, 2023 LOS:  LOS: 0 days  Chief Complaint agitation  Primary Psychiatric Diagnoses  Dementia with behavioral disturbance    Assessment  Hunter [name] is a 89 y.o. male admitted: Medicallyfor 08/24/2023 12:14 AM with hx of dementia brought in to ED for agitation form a facility. Patient has been in ED waiting for placement. Psychiatry is requested to get involved again as patient's agitation has been getting worse.  11/20/23: Pt continues to be confused and agitated- changed to Zydis  5 mg TID; Changed depakote  to sprinkles 750 gm TID; added klonopin 0.5 mg BID ( safer to minimize the antipsychotic use given prolonged Qtc)  will check Depakote  levels and ammonia. Sensory Deprivation: Legal guardian wants to bring patient's hearing aids and this provider recommended to bring in hearing aids as sensory deprivation can make confusion worse. EKG 11/19/23- QTC 539- discontinued PRN zyprexa  and increased ativan  2 mg Q6 hr PRN.  Discussed extensively with the legal  guardian about the risk benefits of the current treatment in the context of patient's uncontrollable agitation and QTc prolongation and EKG.  Legal guardian expressed her understanding and gave consent for the medication adjustments.    Diagnoses:  Active Hospital problems: Principal Problem:   Aggressive behavior due to dementia [facility]) Active Problems:   Physically aggressive behavior    Plan   ## Psychiatric Medication Recommendations:  Prolonged QTc-539-please use Ativan  as as needed rather than Zyprexa  or Geodon .  Geodon  has notorious impact on QTc prolongation.  ## Medical Decision  Making Capacity: Not specifically addressed in this encounter  ## Further Work-up:  -- Depakote  levels every morning 11/20/2023  -- most recent EKG on 11/19/2023 had QtC of 539 -- Pertinent labwork reviewed earlier this admission includes: CMP, ammonia levels   ## Disposition:--Recommend nursing home after better control of behavioral disturbance  ## Behavioral / Environmental: -Delirium Precautions: Delirium Interventions for Nursing and Staff: - RN to open blinds every AM. - To Bedside: Glasses, hearing aide, and pt's own shoes. Make available to patients. when possible and encourage use. - Encourage po fluids when appropriate, keep fluids within reach. - OOB to chair with meals. - Passive ROM exercises to all extremities with AM & PM care. - RN to assess orientation to person, time and place QAM and PRN. - Recommend extended visitation hours with familiar family/friends as feasible. - Staff to minimize disturbances at night. Turn off television when pt asleep or when not in use.    ## Safety and Observation Level:  - Based on my clinical evaluation, I estimate the patient to be at low risk of self harm in the current setting. - At this time, we recommend  routine. This decision is based on my review of the chart including patient's history and current presentation, interview of the patient, mental status examination, and consideration of suicide risk including evaluating suicidal ideation, plan, intent, suicidal or self-harm behaviors, risk factors, and protective factors. This judgment is based on our ability to directly address suicide risk, implement suicide prevention strategies, and develop a safety plan while the patient is in the clinical setting. Please contact our team if there is a concern that risk level has changed.  CSSR Risk Category:C-SSRS RISK CATEGORY: No Risk  Suicide Risk Assessment: Unable to assess at this point as patient has hard of hearing and has no hearing aids, patient  completely confused and unable to engage in a meaningful interview  Thank you for this consult request. Recommendations have been communicated to the primary team.  We will continue to follow-up at this time.   Arty Lantzy, MD       History of Present Illness  JIRAH DOEGE is a 89 y.o. male admitted: Medicallyfor 08/24/2023 12:14 AM with hx of dementia brought in to ED for agitation form a facility. Patient has been in ED waiting for placement. Psychiatry is requested to get involved again as patient's agitation has been getting worse.  11/20/23: Patient is noted to be in bed slid down and is grabbing his diaper, talking nonsensical.  He is noted to be delirious and picking on the diaper.  Provider wrote the questions on a paper and patient took the paper to read the question but response saying "they are not waiting for him, he is not in line ".  He is unable to answer any of the orientation questions.  Turn on a paper, unable to answer any questions related to his mood or hallucinations.  This provider reached out to ED provider and discussed the current clinical picture of delirium and requested urine analysis and monitor patient's electrolytes.  Collateral information:  Contacted legal guardian informs that patient was living independently in an independent Apt. 5 months ago.  DSS has tried to reach out to his family but everybody is distant always.  Does when DSS has to take over the guardianship.  Patient was placed facility but given his agitation patient was transferred to ED for stabilization.  Provider and legal guardian had extensive discussion about patient's medical problems, contraindication for most of the psychotropic medications like antipsychotics that are given for PRNs Geodon  and Zyprexa .  Provider also discussed Depakote  and possible liver impairment.  Provider discussed about Ativan  to be used as a as needed with scheduled Zyprexa  to minimize the number of PRNs.  Legal guardian  provided consent.  Legal guardian also confirmed that patient is hard of hearing and she stated that the nurse told her not to bring the hearing aid as they did not want patient to lose the hearing aids.  Provider educated about sensory deprivation being a main trigger for confusion and delirium and requested the legal guardian to bring the hearing aid to the ED.  ROS   Psychiatric and Social History  Psychiatric History: Unable to obtain as there is no family member available, DSS legal guardian is new to the patient as patient is extremely confused  Social History:  Unable to obtain  Substance History Unable to obtain  Exam Findings  Physical Exam: Reviewed and agree with the physical exam findings conducted by the ED provider. Vital Signs:  Temp:  [97.7 F (36.5 C)-97.8 F (36.6 C)] 97.8 F (36.6 C) (05/10 0854) Pulse Rate:  [63-83] 63 (05/10 0854) Resp:  [14-16] 14 (05/10 0854) BP: (96-107)/(56-74) 107/74 (05/10 0854) SpO2:  [99 %] 99 % (05/10 0854) Blood pressure 107/74, pulse 63, temperature 97.8 F (36.6 C), temperature source Oral, resp. rate 14, height 6' (1.829 m), weight 97.5 kg, SpO2 99%. Body mass index is 29.16 kg/m.    Mental Status Exam: General Appearance: Disheveled  Orientation:  Negative  Memory:  Negative  Concentration:  Concentration: Poor and Attention Span: Poor  Recall:  Negative  Attention  Other: pt hard of hearing  Eye Contact:  Minimal  Speech:  Pressured  Language:  Negative  Volume:  Increased  Mood: unable to assess  Affect:  Labile  Thought Process:  Disorganized  Thought Content:  Illogical  Suicidal Thoughts:  unable to assess  Homicidal Thoughts:  unable to assess  Judgement:  Impaired  Insight:  Lacking  Psychomotor Activity:  Restlessness  Akathisia:  No  Fund of Knowledge:  Negative      Assets:  Resilience  Cognition:  Impaired,  Moderate  ADL's:  Impaired  AIMS (if indicated):        Other History   These have been  pulled in through the EMR, reviewed, and updated if appropriate.  Family History:  The patient's family history includes Arthritis in an other family member; Hyperlipidemia in an other family member; Hypertension in an other family member; Lung cancer in his mother; Prostate cancer in an other family member.  Medical History: Past Medical History:  Diagnosis Date   Allergic rhinitis 02/02/2007   Anemia    Arthritis    "knees" (02/08/2017)   Chronic lower back pain    CKD (chronic kidney disease), stage III (HCC) 05/27/2011   Complete heart block 02/08/2017  Eczema    Essential hypertension 02/02/2007   Lasix  60 mg, losartan  50mg ,  Amlodipine  5mg --> 2.5 mg.  Usually have to repeat BP measurements  In the past-Maxzide  37.5-25mg .   GERD (gastroesophageal reflux disease) occasional   Hearing loss of both ears    Heart failure with preserved ejection fraction (HFpEF) 12/06/2013   History of skin cancer 03/26/2014   nose    Hx of echocardiogram    Echo (07/2013): Mild LVH, EF 55-60%, grade 1 diastolic dysfunction, mild LAE, PASP 23   Hyperlipidemia    Hypertension    Hypothyroidism    LBBB (left bundle branch block)    Left patella fracture 2018   "no OR" (02/08/2017)   Major depression in partial remission 02/02/2007   Amitriptyline  25mg  for sleep (stop when runs out of #10 04/2018(, zoloft  100-->150mg --> 200mg    Mild neurocognitive disorder 06/21/2019   Neuropathy (HCC) 11/02/2011   Nocturia    Peripheral vascular disease (HCC) LOWER EXTREMITIES   Spinal stenosis in cervical region 03/21/2010   Squamous cell skin cancer, penis: glans (HCC) 05/2010   Initial excision 11/11; recurrence, excision and laser Rx 9/13   Thrombocytopenia (HCC) 05/23/2012   Urinary frequency 09/05/2009   Possible BPH- see Trudi Fus notes    Vitamin D  deficiency 08/15/2008    Surgical History: Past Surgical History:  Procedure Laterality Date   CIRCUMCISION/ LASER DISSECTION PENILE GLANS CANCER  06-02-2010    CYSTOSCOPY WITH URETHRAL DILATATION  03/28/2012   Procedure: CYSTOSCOPY WITH URETHRAL DILATATION;  Surgeon: Edmund Gouge, MD;  Location: [facility];  Service: Urology;  Laterality: N/A;  excision biopsy extensive meatal penile carcinoma meatoplasty   EXCISION RIGHT WRIST BENIGN TUMOR  2002 (APPROX)   hip surgery     INCISION AND DRAINAGE DEEP NECK ABSCESS     INGUINAL HERNIA REPAIR  1970's   KNEE ARTHROSCOPY Right 2017   LUMBAR LAMINECTOMY/DECOMPRESSION MICRODISCECTOMY N/A 05/24/2020   Procedure: OPEN LAMINECTOMY LUMBAR THREE-LUMBAR FOUR;  Surgeon: Pincus Bridgeman, DO;  Location: [facility] OR;  Service: Neurosurgery;  Laterality: N/A;   PACEMAKER IMPLANT N/A 02/08/2017   Procedure: Pacemaker Implant;  Surgeon: Verona Goodwill, MD;  Location: [facility] INVASIVE CV LAB;  Service: Cardiovascular;  Laterality: N/A;   PACEMAKER IMPLANT  02/08/2017   SJM Assurity MRI dual chamber PPM implanted by Dr Rodolfo Clan for complete heart block   PENILE BX  05-09-2010   TONSILLECTOMY     TRANSURETHRAL RESECTION OF PROSTATE N/A 05/10/2015   Procedure: CYSTOSCOPY, URETHRAL MEATAL DILATION, TRANSURETHRAL RESECTION OF THE PROSTATE (TURP);  Surgeon: Annamarie Kid, MD;  Location: [facility];  Service: Urology;  Laterality: N/A;     Medications:   Current Facility-Administered Medications:    acetaminophen  (TYLENOL ) tablet 650 mg, 650 mg, Oral, Q6H PRN, Buell Carmin, MD, 650 mg at 11/06/23 0407   albuterol  (PROVENTIL ) (2.5 MG/3ML) 0.083% nebulizer solution 2.5 mg, 2.5 mg, Inhalation, Q8H PRN, Buell Carmin, MD   amLODipine  (NORVASC ) tablet 2.5 mg, 2.5 mg, Oral, Daily, Buell Carmin, MD, 2.5 mg at 11/19/23 7829   aspirin  chewable tablet 81 mg, 81 mg, Oral, Daily, Buell Carmin, MD, 81 mg at 11/20/23 5621   atorvastatin  (LIPITOR) tablet 20 mg, 20 mg, Oral, QHS, Buell Carmin, MD, 20 mg at 11/19/23 2153   clonazePAM (KLONOPIN) disintegrating tablet 0.5 mg, 0.5 mg, Oral, BID, Jeymi Hepp, MD, 0.5 mg at 11/20/23  1203   cyanocobalamin  (VITAMIN B12) injection 1,000 mcg, 1,000 mcg, Intramuscular, Q30 days, Buell Carmin, MD, 1,000 mcg at 10/22/23  1107   divalproex  (DEPAKOTE  SPRINKLE) capsule 750 mg, 750 mg, Oral, Q12H, Reuben Knoblock, MD, 750 mg at 11/20/23 1303   ferrous sulfate  tablet 325 mg, 325 mg, Oral, Q breakfast, Buell Carmin, MD, 325 mg at 11/20/23 1610   furosemide  (LASIX ) tablet 80 mg, 80 mg, Oral, Daily, Buell Carmin, MD, 80 mg at 11/20/23 9604   levothyroxine  (SYNTHROID ) tablet 75 mcg, 75 mcg, Oral, q morning, Buell Carmin, MD, 75 mcg at 11/20/23 5409   LORazepam  (ATIVAN ) tablet 2 mg, 2 mg, Oral, Q6H PRN **OR** LORazepam  (ATIVAN ) injection 2 mg, 2 mg, Intramuscular, Q6H PRN, Sugar Vanzandt, MD   losartan  (COZAAR ) tablet 50 mg, 50 mg, Oral, Daily, Buell Carmin, MD, 50 mg at 11/19/23 8119   OLANZapine  (ZYPREXA ) injection 5 mg, 5 mg, Intramuscular, Once, Jacquie Maudlin, MD   OLANZapine  zydis (ZYPREXA ) disintegrating tablet 5 mg, 5 mg, Oral, TID, Sherry Rogus, MD   pantoprazole  (PROTONIX ) EC tablet 40 mg, 40 mg, Oral, Daily, Buell Carmin, MD, 40 mg at 11/20/23 1478   sertraline  (ZOLOFT ) tablet 150 mg, 150 mg, Oral, Daily, Lee, Jacqueline Eun, NP, 150 mg at 11/20/23 2956  Current Outpatient Medications:    albuterol  (VENTOLIN  HFA) 108 (90 Base) MCG/ACT inhaler, Inhale 2 puffs into the lungs every 8 (eight) hours as needed for wheezing or shortness of breath., Disp: , Rfl:    amLODipine  (NORVASC ) 2.5 MG tablet, TAKE 1 TABLET BY MOUTH DAILY, Disp: 30 tablet, Rfl: 3   aspirin  81 MG chewable tablet, Chew 81 mg by mouth daily. , Disp: , Rfl:    atorvastatin  (LIPITOR) 20 MG tablet, TAKE 1 TABLET BY MOUTH DAILY (Patient taking differently: Take 20 mg by mouth at bedtime.), Disp: 90 tablet, Rfl: 3   cyanocobalamin  (VITAMIN B12) 1000 MCG/ML injection, Inject 1,000 mcg into the muscle every 30 (thirty) days., Disp: , Rfl:    divalproex  (DEPAKOTE ) 125 MG DR tablet, Take 125 mg by mouth 3 (three) times daily.,  Disp: , Rfl:    ferrous sulfate  325 (65 FE) MG tablet, Take 325 mg by mouth daily with breakfast., Disp: , Rfl:    furosemide  (LASIX ) 80 MG tablet, Take 1 tablet (80 mg total) by mouth daily., Disp: 30 tablet, Rfl: 6   levothyroxine  (SYNTHROID ) 75 MCG tablet, TAKE 1 TABLET BY MOUTH EVERY MORNING, Disp: 90 tablet, Rfl: 1   losartan  (COZAAR ) 50 MG tablet, TAKE 1 TABLET BY MOUTH DAILY, Disp: 90 tablet, Rfl: 1   OLANZapine  (ZYPREXA ) 2.5 MG tablet, Take 2.5 mg by mouth in the morning and at bedtime., Disp: , Rfl:    omeprazole  (PRILOSEC) 20 MG capsule, Take 20 mg by mouth daily., Disp: , Rfl:    sennosides-docusate sodium  (SENOKOT-S) 8.6-50 MG tablet, Take 2 tablets by mouth daily., Disp: , Rfl:    sertraline  (ZOLOFT ) 100 MG tablet, TAKE 2 TABLETS BY MOUTH DAILY, Disp: 180 tablet, Rfl: 3   ciprofloxacin  (CILOXAN ) 0.3 % ophthalmic solution, Place 2 drops into both eyes every 4 (four) hours while awake. (Patient not taking: Reported on 08/24/2023), Disp: , Rfl:    Cyanocobalamin  (B-12 PO), Take 1 tablet by mouth daily. (Patient not taking: Reported on 05/31/2023), Disp: , Rfl:    Vitamin D , Ergocalciferol , (DRISDOL ) 1.25 MG (50000 UNIT) CAPS capsule, TAKE 1 CAPSULE BY MOUTH EVERY 7  DAYS, Disp: 15 capsule, Rfl: 2  Allergies: No Known Allergies  Manuelita Moxon, MD
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
## 2023-11-20 NOTE — ED Notes (Signed)
 Pt lunch tray and beverage left at bedside.

## 2023-11-20 NOTE — ED Notes (Signed)
 Pt consumed 25% of meal tray. Pt given a cup of applesauce and consumed 100%.

## 2023-11-20 NOTE — ED Provider Notes (Signed)
-----------------------------------------   4:12 AM on 11/20/2023 -----------------------------------------   Blood pressure (!) 96/56, pulse 83, temperature 97.7 F (36.5 C), temperature source Oral, resp. rate 16, height 6' (1.829 m), weight 97.5 kg, SpO2 99%.  The patient is calm and cooperative at this time.  There have been no acute events since the last update.  Awaiting disposition plan from case management/social work.    [name], [name], DO 11/20/23 [phone]

## 2023-11-21 DIAGNOSIS — F03918 Unspecified dementia, unspecified severity, with other behavioral disturbance: Secondary | ICD-10-CM | POA: Diagnosis not present

## 2023-11-21 MED ORDER — POTASSIUM CHLORIDE CRYS ER 20 MEQ PO TBCR
40.0000 meq | EXTENDED_RELEASE_TABLET | Freq: Once | ORAL | Status: AC
  Administered 2023-11-21: 40 meq via ORAL
  Filled 2023-11-21: qty 2

## 2023-11-21 NOTE — ED Notes (Signed)
 Pt assisted with dinner tray. Pt consumed 100% of meal.

## 2023-11-21 NOTE — ED Notes (Signed)
Pt cleaned of urinary incontinence.

## 2023-11-21 NOTE — ED Notes (Signed)
 Patient cleaned up after an episode of urinary incontinence. Patient placed in a new brief and new paper pad was placed under the patient. Patient's teeth were brush and deodorant was applied. Patient's hair combed as well. Patient's bed returned to lowest position with call bell applied.

## 2023-11-21 NOTE — ED Notes (Signed)
 Pt cleaned of urinary incontinence. Pt repositioned in bed.

## 2023-11-21 NOTE — ED Notes (Signed)
 Assisted RN change pt and helped pull the pt up in the bed.

## 2023-11-21 NOTE — Consult Note (Signed)
 [facility] Psychiatric Consult Follow up  Patient Name: .Hunter [name]  MRN: [id number]  DOB: [birth year]  Consult Order details:  Orders (From admission, onward)     Start     Ordered   11/19/23 1501  IP CONSULT TO PSYCHIATRY       Ordering Provider: Lind Repine, MD  Provider:  (Not yet assigned)  Question Answer Comment  Place call to: Psych NP   Reason for Consult Consult      11/19/23 1500   10/15/23 1359  IP CONSULT TO PSYCHIATRY       Ordering Provider: Viviano Ground, MD  Provider:  (Not yet assigned)  Question Answer Comment  Consult Timeframe URGENT - requires response within 12 hours   URGENT timeframe requires provider to provider communication, has the provider to provider communication been completed Yes   Reason for Consult? Consult for medication management   Contact phone number where the requesting provider can be reached [phone]      10/15/23 1358   10/09/23 0238  IP CONSULT TO PSYCHIATRY       Ordering Provider: Dewitt Forehand, DO  Provider:  (Not yet assigned)  Question Answer Comment  Consult Timeframe URGENT - requires response within 12 hours   URGENT timeframe requires provider to provider communication, has the provider to provider communication been completed Yes   Reason for Consult? Consult for medication management due to increasing agitation, not improving withy last medication adjustments 10/04/23   Contact phone number where the requesting provider can be reached [phone]      10/09/23 0238   10/08/23 1128  IP CONSULT TO PSYCHIATRY       Comments: Pt depakote  level being checked and psych needs to review/manage meds as agitation has continued.  Ordering Provider: Shane Darling, MD  Provider:  (Not yet assigned)  Question Answer Comment  Place call to: psych   Reason for Consult Other (See Comments) Medication mgmt  Diagnosis/Clinical Info for Consult: continued aggressive behavior      10/08/23 1132   08/24/23 0045  IP CONSULT TO  PSYCHIATRY       Ordering Provider: Norlene Beavers, MD  Provider:  (Not yet assigned)  Question Answer Comment  Place call to: psychiatry   Reason for Consult Consult   Diagnosis/Clinical Info for Consult: aggressive behavior      08/24/23 0044             Mode of Visit: In person    Psychiatry Consult Evaluation  Service Date: Nov 21, 2023 LOS:  LOS: 0 days  Chief Complaint agitation  Primary Psychiatric Diagnoses  Dementia with behavioral disturbance    Assessment  Hunter [name] is a 89 y.o. male admitted: Medicallyfor 08/24/2023 12:14 AM with hx of dementia brought in to ED for agitation form a facility. Patient has been in ED waiting for placement. Psychiatry is requested to get involved again as patient's agitation has been getting worse.  11/21/23: patient is responding well to titration of Depakote , zyprexa  and klonopin. Will continue to monitor the behaviors.  Sensory Deprivation: Legal guardian wants to bring patient's hearing aids and this provider recommended to bring in hearing aids as sensory deprivation can make confusion worse. EKG 11/19/23- QTC 539- discontinued PRN zyprexa  and increased ativan  2 mg Q6 hr PRN.  Discussed extensively with the legal guardian about the risk benefits of the current treatment in the context of patient's uncontrollable agitation and QTc prolongation and EKG.  Legal guardian expressed  her understanding and gave consent for the medication adjustments.    Diagnoses:  Active Hospital problems: Principal Problem:   Aggressive behavior due to dementia [facility]) Active Problems:   Physically aggressive behavior    Plan   ## Psychiatric Medication Recommendations:  Prolonged QTc-539-please use Ativan  as as needed rather than Zyprexa  or Geodon .  Geodon  has notorious impact on QTc prolongation.  ## Medical Decision Making Capacity: Not specifically addressed in this encounter  ## Further Work-up:  -- Depakote  levels every morning  11/20/2023  -- most recent EKG on 11/19/2023 had QtC of 539 -- Pertinent labwork reviewed earlier this admission includes: CMP, ammonia levels   ## Disposition:--Recommend nursing home after better control of behavioral disturbance  ## Behavioral / Environmental: -Delirium Precautions: Delirium Interventions for Nursing and Staff: - RN to open blinds every AM. - To Bedside: Glasses, hearing aide, and pt's own shoes. Make available to patients. when possible and encourage use. - Encourage po fluids when appropriate, keep fluids within reach. - OOB to chair with meals. - Passive ROM exercises to all extremities with AM & PM care. - RN to assess orientation to person, time and place QAM and PRN. - Recommend extended visitation hours with familiar family/friends as feasible. - Staff to minimize disturbances at night. Turn off television when pt asleep or when not in use.    ## Safety and Observation Level:  - Based on my clinical evaluation, I estimate the patient to be at low risk of self harm in the current setting. - At this time, we recommend  routine. This decision is based on my review of the chart including patient's history and current presentation, interview of the patient, mental status examination, and consideration of suicide risk including evaluating suicidal ideation, plan, intent, suicidal or self-harm behaviors, risk factors, and protective factors. This judgment is based on our ability to directly address suicide risk, implement suicide prevention strategies, and develop a safety plan while the patient is in the clinical setting. Please contact our team if there is a concern that risk level has changed.  CSSR Risk Category:C-SSRS RISK CATEGORY: No Risk  Suicide Risk Assessment: Unable to assess at this point as patient has hard of hearing and has no hearing aids, patient completely confused and unable to engage in a meaningful interview  Thank you for this consult request.  Recommendations have been communicated to the primary team.  We will continue to follow-up at this time.   Lasharon Dunivan, MD       History of Present Illness  ELVA BENITES is a 89 y.o. male admitted: Medicallyfor 08/24/2023 12:14 AM with hx of dementia brought in to ED for agitation form a facility. Patient has been in ED waiting for placement. Psychiatry is requested to get involved again as patient's agitation has been getting worse.  11/21/23: patient is noted to be in bed, restless, delirious, picking on his clothes, talking nonsensical, unable to respond to any of the orientation questions.  Collateral information:  Contacted legal guardian informs that patient was living independently in an independent Apt. 5 months ago.  DSS has tried to reach out to his family but everybody is distant always.  Does when DSS has to take over the guardianship.  Patient was placed facility but given his agitation patient was transferred to ED for stabilization.  Provider and legal guardian had extensive discussion about patient's medical problems, contraindication for most of the psychotropic medications like antipsychotics that are given for PRNs Geodon  and  Zyprexa .  Provider also discussed Depakote  and possible liver impairment.  Provider discussed about Ativan  to be used as a as needed with scheduled Zyprexa  to minimize the number of PRNs.  Legal guardian provided consent.  Legal guardian also confirmed that patient is hard of hearing and she stated that the nurse told her not to bring the hearing aid as they did not want patient to lose the hearing aids.  Provider educated about sensory deprivation being a main trigger for confusion and delirium and requested the legal guardian to bring the hearing aid to the ED.  ROS   Psychiatric and Social History  Psychiatric History: Unable to obtain as there is no family member available, DSS legal guardian is new to the patient as patient is extremely  confused  Social History:  Unable to obtain  Substance History Unable to obtain  Exam Findings  Physical Exam: Reviewed and agree with the physical exam findings conducted by the ED provider. Vital Signs:  Temp:  [98.2 F (36.8 C)] 98.2 F (36.8 C) (05/11 1017) Pulse Rate:  [62] 62 (05/11 1017) Resp:  [17] 17 (05/11 1017) BP: (114)/(61) 114/61 (05/11 1017) SpO2:  [98 %] 98 % (05/11 1017) Blood pressure 114/61, pulse 62, temperature 98.2 F (36.8 C), temperature source Oral, resp. rate 17, height 6' (1.829 m), weight 97.5 kg, SpO2 98%. Body mass index is 29.16 kg/m.    Mental Status Exam: General Appearance: Disheveled  Orientation:  Negative  Memory:  Negative  Concentration:  Concentration: Poor and Attention Span: Poor  Recall:  Negative  Attention  Other: pt hard of hearing  Eye Contact:  Minimal  Speech:  Pressured  Language:  Negative  Volume:  Increased  Mood: unable to assess  Affect:  Labile  Thought Process:  Disorganized  Thought Content:  Illogical  Suicidal Thoughts:  unable to assess  Homicidal Thoughts:  unable to assess  Judgement:  Impaired  Insight:  Lacking  Psychomotor Activity:  Restlessness  Akathisia:  No  Fund of Knowledge:  Negative      Assets:  Resilience  Cognition:  Impaired,  Moderate  ADL's:  Impaired  AIMS (if indicated):        Other History   These have been pulled in through the EMR, reviewed, and updated if appropriate.  Family History:  The patient's family history includes Arthritis in an other family member; Hyperlipidemia in an other family member; Hypertension in an other family member; Lung cancer in his mother; Prostate cancer in an other family member.  Medical History: Past Medical History:  Diagnosis Date   Allergic rhinitis 02/02/2007   Anemia    Arthritis    "knees" (02/08/2017)   Chronic lower back pain    CKD (chronic kidney disease), stage III (HCC) 05/27/2011   Complete heart block 02/08/2017    Eczema    Essential hypertension 02/02/2007   Lasix  60 mg, losartan  50mg ,  Amlodipine  5mg --> 2.5 mg.  Usually have to repeat BP measurements  In the past-Maxzide  37.5-25mg .   GERD (gastroesophageal reflux disease) occasional   Hearing loss of both ears    Heart failure with preserved ejection fraction (HFpEF) 12/06/2013   History of skin cancer 03/26/2014   nose    Hx of echocardiogram    Echo (07/2013): Mild LVH, EF 55-60%, grade 1 diastolic dysfunction, mild LAE, PASP 23   Hyperlipidemia    Hypertension    Hypothyroidism    LBBB (left bundle branch block)    Left patella fracture  2018   "no OR" (02/08/2017)   Major depression in partial remission 02/02/2007   Amitriptyline  25mg  for sleep (stop when runs out of #10 04/2018(, zoloft  100-->150mg --> 200mg    Mild neurocognitive disorder 06/21/2019   Neuropathy (HCC) 11/02/2011   Nocturia    Peripheral vascular disease (HCC) LOWER EXTREMITIES   Spinal stenosis in cervical region 03/21/2010   Squamous cell skin cancer, penis: glans (HCC) 05/2010   Initial excision 11/11; recurrence, excision and laser Rx 9/13   Thrombocytopenia (HCC) 05/23/2012   Urinary frequency 09/05/2009   Possible BPH- see Trudi Fus notes    Vitamin D  deficiency 08/15/2008    Surgical History: Past Surgical History:  Procedure Laterality Date   CIRCUMCISION/ LASER DISSECTION PENILE GLANS CANCER  06-02-2010   CYSTOSCOPY WITH URETHRAL DILATATION  03/28/2012   Procedure: CYSTOSCOPY WITH URETHRAL DILATATION;  Surgeon: Edmund Gouge, MD;  Location: [facility] [place];  Service: Urology;  Laterality: N/A;  excision biopsy extensive meatal penile carcinoma meatoplasty   EXCISION RIGHT WRIST BENIGN TUMOR  2002 (APPROX)   hip surgery     INCISION AND DRAINAGE DEEP NECK ABSCESS     INGUINAL HERNIA REPAIR  1970's   KNEE ARTHROSCOPY Right 2017   LUMBAR LAMINECTOMY/DECOMPRESSION MICRODISCECTOMY N/A 05/24/2020   Procedure: OPEN LAMINECTOMY LUMBAR THREE-LUMBAR FOUR;   Surgeon: Pincus Bridgeman, DO;  Location: [facility] OR;  Service: Neurosurgery;  Laterality: N/A;   PACEMAKER IMPLANT N/A 02/08/2017   Procedure: Pacemaker Implant;  Surgeon: Verona Goodwill, MD;  Location: [facility] INVASIVE CV LAB;  Service: Cardiovascular;  Laterality: N/A;   PACEMAKER IMPLANT  02/08/2017   SJM Assurity MRI dual chamber PPM implanted by Dr Rodolfo Clan for complete heart block   PENILE BX  05-09-2010   TONSILLECTOMY     TRANSURETHRAL RESECTION OF PROSTATE N/A 05/10/2015   Procedure: CYSTOSCOPY, URETHRAL MEATAL DILATION, TRANSURETHRAL RESECTION OF THE PROSTATE (TURP);  Surgeon: Annamarie Kid, MD;  Location: [facility] ORS;  Service: Urology;  Laterality: N/A;     Medications:   Current Facility-Administered Medications:    acetaminophen  (TYLENOL ) tablet 650 mg, 650 mg, Oral, Q6H PRN, Buell Carmin, MD, 650 mg at 11/06/23 0407   albuterol  (PROVENTIL ) (2.5 MG/3ML) 0.083% nebulizer solution 2.5 mg, 2.5 mg, Inhalation, Q8H PRN, Buell Carmin, MD   amLODipine  (NORVASC ) tablet 2.5 mg, 2.5 mg, Oral, Daily, Buell Carmin, MD, 2.5 mg at 11/21/23 1019   aspirin  chewable tablet 81 mg, 81 mg, Oral, Daily, Buell Carmin, MD, 81 mg at 11/21/23 1019   atorvastatin  (LIPITOR) tablet 20 mg, 20 mg, Oral, QHS, Buell Carmin, MD, 20 mg at 11/21/23 4010   clonazePAM (KLONOPIN) disintegrating tablet 0.5 mg, 0.5 mg, Oral, BID, Jermel Artley, MD, 0.5 mg at 11/21/23 1019   cyanocobalamin  (VITAMIN B12) injection 1,000 mcg, 1,000 mcg, Intramuscular, Q30 days, Buell Carmin, MD, 1,000 mcg at 11/21/23 1018   divalproex  (DEPAKOTE  SPRINKLE) capsule 750 mg, 750 mg, Oral, Q12H, Vayden Weinand, MD, 750 mg at 11/21/23 1018   ferrous sulfate  tablet 325 mg, 325 mg, Oral, Q breakfast, Buell Carmin, MD, 325 mg at 11/21/23 1018   furosemide  (LASIX ) tablet 80 mg, 80 mg, Oral, Daily, Buell Carmin, MD, 80 mg at 11/21/23 1019   levothyroxine  (SYNTHROID ) tablet 75 mcg, 75 mcg, Oral, q morning, Buell Carmin, MD, 75 mcg at 11/21/23 1018   LORazepam  (ATIVAN )  tablet 2 mg, 2 mg, Oral, Q6H PRN, 2 mg at 11/21/23 1018 **OR** LORazepam  (ATIVAN ) injection 2 mg, 2 mg, Intramuscular, Q6H PRN, Isayah Ignasiak, MD   losartan  (  COZAAR ) tablet 50 mg, 50 mg, Oral, Daily, Buell Carmin, MD, 50 mg at 11/21/23 1019   OLANZapine  (ZYPREXA ) injection 5 mg, 5 mg, Intramuscular, Once, Jacquie Maudlin, MD   OLANZapine  zydis (ZYPREXA ) disintegrating tablet 5 mg, 5 mg, Oral, TID, Javius Sylla, MD, 5 mg at 11/21/23 1650   pantoprazole  (PROTONIX ) EC tablet 40 mg, 40 mg, Oral, Daily, Buell Carmin, MD, 40 mg at 11/21/23 1019   sertraline  (ZOLOFT ) tablet 150 mg, 150 mg, Oral, Daily, Lee, Jacqueline Eun, NP, 150 mg at 11/21/23 1019  Current Outpatient Medications:    albuterol  (VENTOLIN  HFA) 108 (90 Base) MCG/ACT inhaler, Inhale 2 puffs into the lungs every 8 (eight) hours as needed for wheezing or shortness of breath., Disp: , Rfl:    amLODipine  (NORVASC ) 2.5 MG tablet, TAKE 1 TABLET BY MOUTH DAILY, Disp: 30 tablet, Rfl: 3   aspirin  81 MG chewable tablet, Chew 81 mg by mouth daily. , Disp: , Rfl:    atorvastatin  (LIPITOR) 20 MG tablet, TAKE 1 TABLET BY MOUTH DAILY (Patient taking differently: Take 20 mg by mouth at bedtime.), Disp: 90 tablet, Rfl: 3   cyanocobalamin  (VITAMIN B12) 1000 MCG/ML injection, Inject 1,000 mcg into the muscle every 30 (thirty) days., Disp: , Rfl:    divalproex  (DEPAKOTE ) 125 MG DR tablet, Take 125 mg by mouth 3 (three) times daily., Disp: , Rfl:    ferrous sulfate  325 (65 FE) MG tablet, Take 325 mg by mouth daily with breakfast., Disp: , Rfl:    furosemide  (LASIX ) 80 MG tablet, Take 1 tablet (80 mg total) by mouth daily., Disp: 30 tablet, Rfl: 6   levothyroxine  (SYNTHROID ) 75 MCG tablet, TAKE 1 TABLET BY MOUTH EVERY MORNING, Disp: 90 tablet, Rfl: 1   losartan  (COZAAR ) 50 MG tablet, TAKE 1 TABLET BY MOUTH DAILY, Disp: 90 tablet, Rfl: 1   OLANZapine  (ZYPREXA ) 2.5 MG tablet, Take 2.5 mg by mouth in the morning and at bedtime., Disp: , Rfl:    omeprazole   (PRILOSEC) 20 MG capsule, Take 20 mg by mouth daily., Disp: , Rfl:    sennosides-docusate sodium  (SENOKOT-S) 8.6-50 MG tablet, Take 2 tablets by mouth daily., Disp: , Rfl:    sertraline  (ZOLOFT ) 100 MG tablet, TAKE 2 TABLETS BY MOUTH DAILY, Disp: 180 tablet, Rfl: 3   ciprofloxacin  (CILOXAN ) 0.3 % ophthalmic solution, Place 2 drops into both eyes every 4 (four) hours while awake. (Patient not taking: Reported on 08/24/2023), Disp: , Rfl:    Cyanocobalamin  (B-12 PO), Take 1 tablet by mouth daily. (Patient not taking: Reported on 05/31/2023), Disp: , Rfl:    Vitamin D , Ergocalciferol , (DRISDOL ) 1.25 MG (50000 UNIT) CAPS capsule, TAKE 1 CAPSULE BY MOUTH EVERY 7  DAYS, Disp: 15 capsule, Rfl: 2  Allergies: No Known Allergies  Fabian Walder, MD

## 2023-11-21 NOTE — ED Notes (Signed)
Patient is vol pending TOC placement 

## 2023-11-21 NOTE — ED Provider Notes (Signed)
-----------------------------------------   5:49 AM on 11/21/2023 -----------------------------------------   Blood pressure 107/74, pulse 63, temperature 97.8 F (36.6 C), temperature source Oral, resp. rate 14, height 1.829 m (6'), weight 97.5 kg, SpO2 99%.  The patient is calm and cooperative at this time.  There have been no acute events since the last update.  Awaiting disposition plan from [facility] team.   Lynnda Sas, MD 11/21/23 [phone]

## 2023-11-21 NOTE — ED Notes (Signed)
 Pt assisted with lunch tray. Pt ate 75% of lunch

## 2023-11-22 NOTE — ED Notes (Signed)
 Bed alarm ringing and patient has pulled himself down again in bed attempting to exit. PRN meds administered. Patient repositioned. Education provided. Fall precautions maintained.

## 2023-11-22 NOTE — ED Notes (Signed)
 Patient cleaned from urine incontinence. Brief placed, chucks placed, and pt repositioned. Education provided. Fall precautions maintained. Will continue to monitor.

## 2023-11-22 NOTE — ED Notes (Signed)
 Bed alarm ringing and patient has pulled himself down again in bed attempting to exit. Patient repositioned. Education provided. Fall precautions maintained

## 2023-11-22 NOTE — Progress Notes (Signed)
 Occupational Therapy Treatment Patient Details Name: Hunter [name] MRN: [id number] DOB: Nov 13, 1933 Today's Date: 11/22/2023   History of present illness Pt is an 89 y/o M admitted on 08/24/23 under IVC for aggression with staff & behavioral challenges (per psych, due to baseline dementia) at his facility. PMH: chronic LBP, CKD 3, essential HTN, HLD, L BBB, major depression, neuropathy, PVD.   OT comments  Pt received asleep in bed, awakens easily to voice and is able to transition from supine to sit with MOD A. Noted decreased motor planning and ability to comprehend directions, pt remains motivated and cooperative but has difficulties processing due to cognition/HOH. Unable to fully rise to full stand with MAX A +2 and bed elevated using RW, pt cannot fully extend trunk and knees for upright posture with 4x attempts during pericare/LB dressing. MAX - TOTAL A for pericare and LB dressing. Pt able to roll L<>R for brief to be donned bed level. MIN A for self-feeding upright in bed as pt requires hand over hand assist to load spoon of applesauce and bring to mouth. Pt fluctuates in goal progression due to cognition. OT will continue to follow.       If plan is discharge home, recommend the following:  Supervision due to cognitive status;Assist for transportation;Assistance with cooking/housework;Help with stairs or ramp for entrance;Two people to help with walking and/or transfers;Direct supervision/assist for financial management;Direct supervision/assist for medications management;Two people to help with bathing/dressing/bathroom   Equipment Recommendations  Other (comment)       Precautions / Restrictions Precautions Precautions: Fall Recall of Precautions/Restrictions: Impaired Precaution/Restrictions Comments: hx of restlessness, agitation and aggression. however cooperative, & pleasant with therapy Restrictions Weight Bearing Restrictions Per Provider Order: No       Mobility Bed  Mobility Overal bed mobility: Needs Assistance Bed Mobility: Supine to Sit, Sit to Supine     Supine to sit: Mod assist, HOB elevated, Used rails Sit to supine: +2 for physical assistance, HOB elevated, Used rails   General bed mobility comments: able to perform transtion from supine with modA +1, fatigue/cognition require maxA+2 to return safely to supine    Transfers Overall transfer level: Needs assistance Equipment used: Rolling walker (2 wheels) Transfers: Sit to/from Stand Sit to Stand: +2 physical assistance, From elevated surface, Total assist, Max assist           General transfer comment: unable to progress to safely transfer to recliner/attempt mobility this session. even with maxA +2 physical assist, pt cannot perform knee/trunk extension to rise fully to standing. unable to perform sidesteps towards [facility].     Balance Overall balance assessment: Needs assistance Sitting-balance support: Feet supported, Single extremity supported Sitting balance-Leahy Scale: Fair Sitting balance - Comments: sits EOB with unilateral UE support and BLE supported   Standing balance support: During functional activity, Reliant on assistive device for balance, Bilateral upper extremity supported Standing balance-Leahy Scale: Zero Standing balance comment: cannot rise fully upright, maxA +2 for partial stand                           ADL either performed or assessed with clinical judgement   ADL Overall ADL's : Needs assistance/impaired Eating/Feeding: Sitting;Bed level;Minimal assistance Eating/Feeding Details (indicate cue type and reason): decreased ability to feed self noted - provided pt with applesauce and spoon. pt unable to motor plan/comprehend use of spoon. requires hand over hand assist to load spoon and bring to mouth. after multiple attempts, unable  to carry over and pt drinks applesauce out of container. is able to drink from straw when given cup; requires hand over  hand to grasp cup.             Upper Body Dressing : Bed level;Minimal assistance Upper Body Dressing Details (indicate cue type and reason): doffs and dons gown bed level, pt requires minA Lower Body Dressing: Total assistance;Bed level;Sit to/from stand Lower Body Dressing Details (indicate cue type and reason): pt unable to motor plan and comprehend directions to fully rise to standing to clear buttocks. partial stand 3x to doff brief, cannot achieve trunk and knee extension. returned to supine and finished donning brief rolling L>R.     Toileting- Clothing Manipulation and Hygiene: Total assistance;Sit to/from stand;Bed level Toileting - Clothing Manipulation Details (indicate cue type and reason): totalA for pericare     Functional mobility during ADLs: +2 for physical assistance;Rolling walker (2 wheels);Cueing for sequencing;Cueing for safety;Maximal assistance;Total assistance General ADL Comments: requires +2 max-total assist for standing attempts. pt motivated but unable to fully clear buttocks from elevated bed with multiple attempts. doffs brief in standing with maxA, and pericare/donning new brief completed partially in standing and finished in bed     Communication Communication Communication: Impaired Factors Affecting Communication: Hearing impaired (better out of L ear)   Cognition Arousal: Alert Behavior During Therapy: WFL for tasks assessed/performed Cognition: History of cognitive impairments, Cognition impaired             OT - Cognition Comments: cooperative and pleasant. difficulties following commands due to cognition, puts forth good effort                 Following commands: Impaired Following commands impaired: Only follows one step commands consistently      Cueing   Cueing Techniques: Verbal cues, Tactile cues        General Comments Discussed with RN pt will likely require assist to feed self at lunch with cognitive presentation this  date.    Pertinent Vitals/ Pain       Pain Assessment Pain Assessment: No/denies pain Pain Score: 0-No pain   Frequency  Min 2X/week        Progress Toward Goals  OT Goals(current goals can now be found in the care plan section)  Progress towards OT goals: Progressing toward goals  Acute Rehab OT Goals OT Goal Formulation: Patient unable to participate in goal setting Time For Goal Achievement: 12/16/23 Potential to Achieve Goals: Fair ADL Goals Pt Will Perform Grooming: sitting;with contact guard assist Pt Will Perform Upper Body Bathing: sitting;with set-up Pt Will Perform Lower Body Bathing: with min assist;sit to/from stand Pt Will Perform Upper Body Dressing: with min assist;sitting Pt Will Perform Lower Body Dressing: with min assist;sit to/from stand Pt Will Transfer to Toilet: with +2 assist;squat pivot transfer;with mod assist Pt Will Perform Toileting - Clothing Manipulation and hygiene: with max assist;sitting/lateral leans;bed level  Plan         AM-PAC OT "6 Clicks" Daily Activity     Outcome Measure   Help from another person eating meals?: A Little Help from another person taking care of personal grooming?: A Little Help from another person toileting, which includes using toliet, bedpan, or urinal?: Total Help from another person bathing (including washing, rinsing, drying)?: A Little Help from another person to put on and taking off regular upper body clothing?: A Little Help from another person to put on and taking off regular lower body clothing?:  A Lot 6 Click Score: 15    End of Session Equipment Utilized During Treatment: Gait belt;Rolling walker (2 wheels)  OT Visit Diagnosis: Unsteadiness on feet (R26.81);Repeated falls (R29.6);Muscle weakness (generalized) (M62.81)   Activity Tolerance Patient tolerated treatment well;Other (comment) (limited by cognition)   Patient Left in bed;with bed alarm set   Nurse Communication Mobility status  (need for feeder at mealtimes)        Time: 1610-9604 OT Time Calculation (min): 31 min  Charges: OT General Charges $OT Visit: 1 Visit OT Treatments $Self Care/Home Management : 23-37 mins  Jamy Whyte L. Diyan Dave, OTR/L  11/22/23, 11:54 AM

## 2023-11-22 NOTE — ED Notes (Signed)
 Patient in room resting quietly. No distress noted.

## 2023-11-22 NOTE — TOC Progression Note (Signed)
 Transition of Care [facility]) - Progression Note    Patient Details  Name: Hunter [name] MRN: [id number] Date of Birth: June 21, 1934  [facility]) CM/SW Contact  [name], RN Phone Number: 11/22/2023, 2:37 PM  Clinical Narrative:    Clinicals faxed to 3 facilities looking for LTC placement.  [facility], P# [phone], F# [phone] [facility], P# [phone], F# [phone] 3. [facility], P# [phone], F# [phone] This RNCM will follow up tomorrow and TOC will continue to follow.    Expected Discharge Plan:  (TBD) Barriers to Discharge:  (finding an accepting facility)  Expected Discharge Plan and Services     Post Acute Care Choice:  (TBD) Living arrangements for the past 2 months: Skilled Nursing Facility                                       Social Determinants of Health (SDOH) Interventions SDOH Screenings   Food Insecurity: No Food Insecurity (06/13/2023)   Received [facility]  Housing: Low Risk  (06/09/2023)  Transportation Needs: No Transportation Needs (06/13/2023)   Received from [facility]  Utilities: Low Risk  (06/13/2023)   Received from [facility]  Alcohol Screen: Low Risk  (07/06/2021)  Depression (PHQ2-9): Medium Risk (11/26/2022)  Financial Resource Strain: Low Risk  (06/13/2023)   Received from [facility]  Physical Activity: Insufficiently Active (06/13/2023)   Received from [facility]  Social Connections: Moderately Integrated (06/13/2023)   Received from [facility]  Stress: No Stress Concern Present (06/13/2023)   Received from [facility]  Tobacco Use: Low Risk  (08/23/2023)  Recent Concern: Tobacco Use - Medium Risk (06/14/2023)   Received from [facility] Literacy: Medium Risk (06/13/2023)   Received from [facility]    Readmission Risk Interventions     No data to display

## 2023-11-22 NOTE — TOC Progression Note (Signed)
 Transition of Care [facility]) - Progression Note    Patient Details  Name: Hunter [name] MRN: [id number] Date of Birth: [birth year]  [facility]) CM/SW Contact  [name], RN Phone Number: 11/22/2023, 10:05 AM  Clinical Narrative:    Spoke to his guardian Hunter [name]. She's not found any placement for him. Discussed making him a DNR due to his age and poor quality of life. Chase states she'll have to review his medical records and discuss with her supervisor. This RNCM to call and follow up with facilities. TOC to continue to follow.    Expected Discharge Plan:  (TBD) Barriers to Discharge:  (finding an accepting facility)  Expected Discharge Plan and Services     Post Acute Care Choice:  (TBD) Living arrangements for the past 2 months: Skilled Nursing Facility                                       Social Determinants of Health (SDOH) Interventions SDOH Screenings   Food Insecurity: No Food Insecurity (06/13/2023)   Received from [facility]  Housing: Low Risk  (06/09/2023)  Transportation Needs: No Transportation Needs (06/13/2023)   Received from [facility]  Utilities: Low Risk  (06/13/2023)   Received from [facility]  Alcohol Screen: Low Risk  (07/06/2021)  Depression (PHQ2-9): Medium Risk (11/26/2022)  Financial Resource Strain: Low Risk  (06/13/2023)   Received from [facility]  Physical Activity: Insufficiently Active (06/13/2023)   Received from [facility]  Social Connections: Moderately Integrated (06/13/2023)   Received from [facility]  Stress: No Stress Concern Present (06/13/2023)   Received from [facility]  Tobacco Use: Low Risk  (08/23/2023)  Recent Concern: Tobacco Use - Medium Risk (06/14/2023)   Received from [facility] Literacy: Medium Risk (06/13/2023)   Received from [facility]    Readmission Risk Interventions     No data to display

## 2023-11-22 NOTE — ED Provider Notes (Signed)
-----------------------------------------   6:08 AM on 11/22/2023 -----------------------------------------   Blood pressure 114/61, pulse 62, temperature 98.2 F (36.8 C), temperature source Oral, resp. rate 17, height 6' (1.829 m), weight 97.5 kg, SpO2 98%.  The patient is calm and cooperative at this time.  There have been no acute events since the last update.  Awaiting disposition plan from case management/social work.    Beatric Fulop, [name], DO 11/22/23 [phone]

## 2023-11-22 NOTE — ED Notes (Signed)
 Bed alarm ringing. Patient slid self down in bed attempting to exit. Patient brief changed from urine incontinence. Will continue to monitor. Rails up and fall precautions maintained.

## 2023-11-22 NOTE — ED Notes (Signed)
 Patient cleaned from urine soaked brief. Full linen change provided. Patient fed by this nurse and consumed 100% meal tray. Lights on and TV on for distraction/entertainment. Fall precautions maintained. Bed alarm on. Will continue to monitor.

## 2023-11-23 ENCOUNTER — Emergency Department

## 2023-11-23 ENCOUNTER — Observation Stay

## 2023-11-23 ENCOUNTER — Encounter: Payer: Self-pay | Admitting: Pharmacist

## 2023-11-23 DIAGNOSIS — Z950 Presence of cardiac pacemaker: Secondary | ICD-10-CM | POA: Diagnosis not present

## 2023-11-23 DIAGNOSIS — I959 Hypotension, unspecified: Secondary | ICD-10-CM | POA: Diagnosis present

## 2023-11-23 DIAGNOSIS — K219 Gastro-esophageal reflux disease without esophagitis: Secondary | ICD-10-CM | POA: Diagnosis not present

## 2023-11-23 DIAGNOSIS — E876 Hypokalemia: Secondary | ICD-10-CM | POA: Diagnosis not present

## 2023-11-23 DIAGNOSIS — J9811 Atelectasis: Secondary | ICD-10-CM | POA: Diagnosis not present

## 2023-11-23 DIAGNOSIS — T68XXXA Hypothermia, initial encounter: Secondary | ICD-10-CM

## 2023-11-23 DIAGNOSIS — F03918 Unspecified dementia, unspecified severity, with other behavioral disturbance: Secondary | ICD-10-CM | POA: Diagnosis not present

## 2023-11-23 DIAGNOSIS — F0392 Unspecified dementia, unspecified severity, with psychotic disturbance: Secondary | ICD-10-CM | POA: Diagnosis present

## 2023-11-23 DIAGNOSIS — R4689 Other symptoms and signs involving appearance and behavior: Secondary | ICD-10-CM | POA: Diagnosis not present

## 2023-11-23 DIAGNOSIS — W19XXXA Unspecified fall, initial encounter: Secondary | ICD-10-CM | POA: Diagnosis present

## 2023-11-23 DIAGNOSIS — E782 Mixed hyperlipidemia: Secondary | ICD-10-CM | POA: Diagnosis not present

## 2023-11-23 DIAGNOSIS — R918 Other nonspecific abnormal finding of lung field: Secondary | ICD-10-CM | POA: Diagnosis not present

## 2023-11-23 DIAGNOSIS — E785 Hyperlipidemia, unspecified: Secondary | ICD-10-CM | POA: Diagnosis not present

## 2023-11-23 DIAGNOSIS — I10 Essential (primary) hypertension: Secondary | ICD-10-CM | POA: Diagnosis not present

## 2023-11-23 DIAGNOSIS — F03911 Unspecified dementia, unspecified severity, with agitation: Secondary | ICD-10-CM

## 2023-11-23 DIAGNOSIS — D696 Thrombocytopenia, unspecified: Secondary | ICD-10-CM | POA: Diagnosis not present

## 2023-11-23 DIAGNOSIS — I5032 Chronic diastolic (congestive) heart failure: Secondary | ICD-10-CM | POA: Diagnosis not present

## 2023-11-23 DIAGNOSIS — Z1152 Encounter for screening for COVID-19: Secondary | ICD-10-CM | POA: Diagnosis not present

## 2023-11-23 DIAGNOSIS — Z515 Encounter for palliative care: Secondary | ICD-10-CM | POA: Diagnosis not present

## 2023-11-23 DIAGNOSIS — N189 Chronic kidney disease, unspecified: Secondary | ICD-10-CM | POA: Diagnosis not present

## 2023-11-23 DIAGNOSIS — I130 Hypertensive heart and chronic kidney disease with heart failure and stage 1 through stage 4 chronic kidney disease, or unspecified chronic kidney disease: Secondary | ICD-10-CM | POA: Diagnosis present

## 2023-11-23 DIAGNOSIS — R6510 Systemic inflammatory response syndrome (SIRS) of non-infectious origin without acute organ dysfunction: Secondary | ICD-10-CM

## 2023-11-23 DIAGNOSIS — I442 Atrioventricular block, complete: Secondary | ICD-10-CM | POA: Diagnosis not present

## 2023-11-23 DIAGNOSIS — E039 Hypothyroidism, unspecified: Secondary | ICD-10-CM | POA: Diagnosis not present

## 2023-11-23 DIAGNOSIS — R627 Adult failure to thrive: Secondary | ICD-10-CM | POA: Diagnosis not present

## 2023-11-23 DIAGNOSIS — N179 Acute kidney failure, unspecified: Secondary | ICD-10-CM | POA: Diagnosis not present

## 2023-11-23 DIAGNOSIS — I6782 Cerebral ischemia: Secondary | ICD-10-CM | POA: Diagnosis not present

## 2023-11-23 DIAGNOSIS — R0989 Other specified symptoms and signs involving the circulatory and respiratory systems: Secondary | ICD-10-CM | POA: Diagnosis not present

## 2023-11-23 DIAGNOSIS — I739 Peripheral vascular disease, unspecified: Secondary | ICD-10-CM | POA: Diagnosis present

## 2023-11-23 DIAGNOSIS — N185 Chronic kidney disease, stage 5: Secondary | ICD-10-CM | POA: Diagnosis not present

## 2023-11-23 DIAGNOSIS — I517 Cardiomegaly: Secondary | ICD-10-CM | POA: Diagnosis not present

## 2023-11-23 DIAGNOSIS — L89159 Pressure ulcer of sacral region, unspecified stage: Secondary | ICD-10-CM | POA: Diagnosis present

## 2023-11-23 DIAGNOSIS — K5641 Fecal impaction: Secondary | ICD-10-CM | POA: Diagnosis not present

## 2023-11-23 DIAGNOSIS — F0394 Unspecified dementia, unspecified severity, with anxiety: Secondary | ICD-10-CM | POA: Diagnosis present

## 2023-11-23 DIAGNOSIS — E870 Hyperosmolality and hypernatremia: Secondary | ICD-10-CM | POA: Diagnosis not present

## 2023-11-23 DIAGNOSIS — R531 Weakness: Secondary | ICD-10-CM | POA: Diagnosis not present

## 2023-11-23 DIAGNOSIS — K828 Other specified diseases of gallbladder: Secondary | ICD-10-CM | POA: Diagnosis not present

## 2023-11-23 DIAGNOSIS — F0393 Unspecified dementia, unspecified severity, with mood disturbance: Secondary | ICD-10-CM | POA: Diagnosis present

## 2023-11-23 DIAGNOSIS — K838 Other specified diseases of biliary tract: Secondary | ICD-10-CM | POA: Diagnosis not present

## 2023-11-23 DIAGNOSIS — K8020 Calculus of gallbladder without cholecystitis without obstruction: Secondary | ICD-10-CM | POA: Diagnosis not present

## 2023-11-23 DIAGNOSIS — N1832 Chronic kidney disease, stage 3b: Secondary | ICD-10-CM | POA: Diagnosis not present

## 2023-11-23 DIAGNOSIS — F329 Major depressive disorder, single episode, unspecified: Secondary | ICD-10-CM | POA: Diagnosis present

## 2023-11-23 DIAGNOSIS — Z66 Do not resuscitate: Secondary | ICD-10-CM | POA: Diagnosis not present

## 2023-11-23 DIAGNOSIS — R680 Hypothermia, not associated with low environmental temperature: Secondary | ICD-10-CM | POA: Diagnosis present

## 2023-11-23 DIAGNOSIS — K7689 Other specified diseases of liver: Secondary | ICD-10-CM | POA: Diagnosis not present

## 2023-11-23 DIAGNOSIS — L8990 Pressure ulcer of unspecified site, unspecified stage: Secondary | ICD-10-CM | POA: Insufficient documentation

## 2023-11-23 DIAGNOSIS — T68XXXS Hypothermia, sequela: Secondary | ICD-10-CM | POA: Diagnosis not present

## 2023-11-23 LAB — COMPREHENSIVE METABOLIC PANEL WITH GFR
ALT: 30 U/L (ref 0–44)
AST: 44 U/L — ABNORMAL HIGH (ref 15–41)
Albumin: 3.6 g/dL (ref 3.5–5.0)
Alkaline Phosphatase: 87 U/L (ref 38–126)
Anion gap: 10 (ref 5–15)
BUN: 34 mg/dL — ABNORMAL HIGH (ref 8–23)
CO2: 25 mmol/L (ref 22–32)
Calcium: 9.1 mg/dL (ref 8.9–10.3)
Chloride: 100 mmol/L (ref 98–111)
Creatinine, Ser: 1.53 mg/dL — ABNORMAL HIGH (ref 0.61–1.24)
GFR, Estimated: 44 mL/min — ABNORMAL LOW (ref >60)
Glucose, Bld: 95 mg/dL (ref 70–99)
Potassium: 3.6 mmol/L (ref 3.5–5.1)
Sodium: 135 mmol/L (ref 135–145)
Total Bilirubin: 0.8 mg/dL (ref 0.0–1.2)
Total Protein: 6.2 g/dL — ABNORMAL LOW (ref 6.5–8.1)

## 2023-11-23 LAB — CK: Total CK: 21 U/L — ABNORMAL LOW (ref 49–397)

## 2023-11-23 LAB — CBC WITH DIFFERENTIAL/PLATELET
Abs Immature Granulocytes: 0.03 10*3/uL (ref 0.00–0.07)
Basophils Absolute: 0 10*3/uL (ref 0.0–0.1)
Basophils Relative: 0 %
Eosinophils Absolute: 0.1 10*3/uL (ref 0.0–0.5)
Eosinophils Relative: 2 %
HCT: 45.2 % (ref 39.0–52.0)
Hemoglobin: 14.5 g/dL (ref 13.0–17.0)
Immature Granulocytes: 1 %
Lymphocytes Relative: 53 %
Lymphs Abs: 2.7 10*3/uL (ref 0.7–4.0)
MCH: 27.1 pg (ref 26.0–34.0)
MCHC: 32.1 g/dL (ref 30.0–36.0)
MCV: 84.3 fL (ref 80.0–100.0)
Monocytes Absolute: 0.4 10*3/uL (ref 0.1–1.0)
Monocytes Relative: 8 %
Neutro Abs: 1.8 10*3/uL (ref 1.7–7.7)
Neutrophils Relative %: 36 %
Platelets: 100 10*3/uL — ABNORMAL LOW (ref 150–400)
RBC: 5.36 MIL/uL (ref 4.22–5.81)
RDW: 15.5 % (ref 11.5–15.5)
WBC: 5.1 10*3/uL (ref 4.0–10.5)
nRBC: 0 % (ref 0.0–0.2)

## 2023-11-23 LAB — TROPONIN I (HIGH SENSITIVITY): Troponin I (High Sensitivity): 16 ng/L (ref <18)

## 2023-11-23 LAB — TSH: TSH: 3.2 u[IU]/mL (ref 0.350–4.500)

## 2023-11-23 LAB — URINALYSIS, W/ REFLEX TO CULTURE (INFECTION SUSPECTED)
Bacteria, UA: NONE SEEN
Bilirubin Urine: NEGATIVE
Glucose, UA: NEGATIVE mg/dL
Hgb urine dipstick: NEGATIVE
Ketones, ur: NEGATIVE mg/dL
Leukocytes,Ua: NEGATIVE
Nitrite: NEGATIVE
Protein, ur: NEGATIVE mg/dL
Specific Gravity, Urine: 1.016 (ref 1.005–1.030)
Squamous Epithelial / HPF: 0 /HPF (ref 0–5)
pH: 5 (ref 5.0–8.0)

## 2023-11-23 LAB — MRSA NEXT GEN BY PCR, NASAL: MRSA by PCR Next Gen: NOT DETECTED

## 2023-11-23 LAB — VALPROIC ACID LEVEL: Valproic Acid Lvl: 79 ug/mL (ref 50–100)

## 2023-11-23 LAB — PREALBUMIN: Prealbumin: 22 mg/dL (ref 18–38)

## 2023-11-23 LAB — BRAIN NATRIURETIC PEPTIDE: B Natriuretic Peptide: 48.7 pg/mL (ref 0.0–100.0)

## 2023-11-23 LAB — PROTIME-INR
INR: 1.1 (ref 0.8–1.2)
Prothrombin Time: 14.7 s (ref 11.4–15.2)

## 2023-11-23 LAB — RESP PANEL BY RT-PCR (RSV, FLU A&B, COVID)  RVPGX2
Influenza A by PCR: NEGATIVE
Influenza B by PCR: NEGATIVE
Resp Syncytial Virus by PCR: NEGATIVE
SARS Coronavirus 2 by RT PCR: NEGATIVE

## 2023-11-23 LAB — AMMONIA: Ammonia: <13 umol/L (ref 9–35)

## 2023-11-23 LAB — CORTISOL: Cortisol, Plasma: 10.4 ug/dL

## 2023-11-23 LAB — LACTIC ACID, PLASMA: Lactic Acid, Venous: 1.3 mmol/L (ref 0.5–1.9)

## 2023-11-23 LAB — APTT: aPTT: 26 s (ref 24–36)

## 2023-11-23 LAB — PROCALCITONIN: Procalcitonin: <0.1 ng/mL

## 2023-11-23 LAB — CBG MONITORING, ED: Glucose-Capillary: 96 mg/dL (ref 70–99)

## 2023-11-23 MED ORDER — ENOXAPARIN SODIUM 40 MG/0.4ML IJ SOSY
40.0000 mg | PREFILLED_SYRINGE | INTRAMUSCULAR | Status: DC
  Administered 2023-11-24 – 2023-12-01 (×7): 40 mg via SUBCUTANEOUS
  Filled 2023-11-23 (×7): qty 0.4

## 2023-11-23 MED ORDER — METRONIDAZOLE 500 MG/100ML IV SOLN
500.0000 mg | Freq: Two times a day (BID) | INTRAVENOUS | Status: DC
  Administered 2023-11-23 – 2023-11-24 (×3): 500 mg via INTRAVENOUS
  Filled 2023-11-23 (×3): qty 100

## 2023-11-23 MED ORDER — ACETAMINOPHEN 325 MG PO TABS: 650.0000 mg | ORAL_TABLET | Freq: Four times a day (QID) | ORAL | Status: DC | PRN

## 2023-11-23 MED ORDER — SODIUM CHLORIDE 0.9 % IV SOLN
2.0000 g | Freq: Two times a day (BID) | INTRAVENOUS | Status: DC
Start: 2023-11-23 — End: 2023-11-24
  Administered 2023-11-23 (×2): 2 g via INTRAVENOUS
  Filled 2023-11-23 (×3): qty 12.5

## 2023-11-23 MED ORDER — VANCOMYCIN HCL 1.25 G IV SOLR
1250.0000 mg | INTRAVENOUS | Status: DC
  Filled 2023-11-23: qty 25

## 2023-11-23 MED ORDER — LACTATED RINGERS IV SOLN: 150.0000 mL/h | INTRAVENOUS | Status: DC

## 2023-11-23 MED ORDER — SODIUM CHLORIDE 0.9 % IV BOLUS
1000.0000 mL | Freq: Once | INTRAVENOUS | Status: AC
  Administered 2023-11-23: 1000 mL via INTRAVENOUS

## 2023-11-23 MED ORDER — SODIUM CHLORIDE 0.9 % IV BOLUS
500.0000 mL | Freq: Once | INTRAVENOUS | Status: AC
  Administered 2023-11-23: 500 mL via INTRAVENOUS

## 2023-11-23 MED ORDER — LORAZEPAM 2 MG/ML IJ SOLN
1.0000 mg | INTRAMUSCULAR | Status: DC | PRN
  Administered 2023-11-25 – 2023-11-28 (×5): 1 mg via INTRAVENOUS
  Filled 2023-11-23 (×5): qty 1

## 2023-11-23 MED ORDER — ONDANSETRON HCL 4 MG/2ML IJ SOLN: 4.0000 mg | Freq: Four times a day (QID) | INTRAMUSCULAR | Status: DC | PRN

## 2023-11-23 MED ORDER — ONDANSETRON HCL 4 MG PO TABS: 4.0000 mg | ORAL_TABLET | Freq: Four times a day (QID) | ORAL | Status: DC | PRN

## 2023-11-23 MED ORDER — SODIUM CHLORIDE 0.9 % IV SOLN: INTRAVENOUS | Status: AC

## 2023-11-23 MED ORDER — VANCOMYCIN HCL 2000 MG/400ML IV SOLN
2000.0000 mg | Freq: Once | INTRAVENOUS | Status: AC
  Administered 2023-11-23: 2000 mg via INTRAVENOUS
  Filled 2023-11-23: qty 400

## 2023-11-23 NOTE — Assessment & Plan Note (Addendum)
 Borderline SIRS No evidence of sepsis.  Chest x-ray on 5/21 negative for infection.

## 2023-11-23 NOTE — H&P (Addendum)
 History and Physical    Patient: Hunter [name] ZOX:[id number] DOB: 1933-11-18 DOA: 08/24/2023 DOS: the patient was seen and examined on 11/23/2023 PCP: Almira Jaeger, MD  Patient coming from: Home  Chief Complaint:  Chief Complaint  Patient presents with   [facility] Placement   HPI: Hunter [name] is a 89 y.o. male with medical history significant of hypertension, chronic HFpEF, hyperlipidemia, complete bundle branch block status post pacemaker, stage III CKD, dementia currently in ED holding.  Requesting admission for hypothermia as well as lethargy.  Per report, patient has been a longstanding boarding patient in the ER for psychiatric issues including end-stage dementia and agitation.  Per report, patient with worsening lethargy over the course of the night.  This is a fairly new finding though patient has significant agitation as well as secondary lethargy after antipsychotic regimen.  Patient noted had a rectal temp around 95-96 on evaluation by nursing.  No reports of cough, shortness of breath.  No reports of chest pain.  No abdominal pain or reported diarrhea.  No reported falls.  Systolic pressures have been fairly stable though with pressures dropping into the 90s overnight.  Noted chronic small sacral pressure ulcer.  Psychiatry noted have been consulted on May 9 for worsening agitation in setting of end-stage dementia.  Medications including Depakote  and Zyprexa  ordered for psychiatric management. Overnight in the ER with a Tmax of 95.5, heart rate 50s to 70s.  Respirations into the 20s.  Blood pressure 90s to 110s over 40s to 50s.  Satting well on room air.  White count 5.1, hemoglobin 14.5, platelets 100, creatinine 1.53.  LFTs and T. bili grossly within normal limits.  Urinalysis not indicative of infection.  Chest x-ray and CT head grossly within normal limits.  CT of the chest as well as abdomen pelvis currently pending. Review of Systems: As mentioned in the history of present illness.  All other systems reviewed and are negative. Past Medical History:  Diagnosis Date   Allergic rhinitis 02/02/2007   Anemia    Arthritis    "knees" (02/08/2017)   Chronic lower back pain    CKD (chronic kidney disease), stage III (HCC) 05/27/2011   Complete heart block 02/08/2017   Eczema    Essential hypertension 02/02/2007   Lasix  60 mg, losartan  50mg ,  Amlodipine  5mg --> 2.5 mg.  Usually have to repeat BP measurements  In the past-Maxzide  37.5-25mg .   GERD (gastroesophageal reflux disease) occasional   Hearing loss of both ears    Heart failure with preserved ejection fraction (HFpEF) 12/06/2013   History of skin cancer 03/26/2014   nose    Hx of echocardiogram    Echo (07/2013): Mild LVH, EF 55-60%, grade 1 diastolic dysfunction, mild LAE, PASP 23   Hyperlipidemia    Hypertension    Hypothyroidism    LBBB (left bundle branch block)    Left patella fracture 2018   "no OR" (02/08/2017)   Major depression in partial remission 02/02/2007   Amitriptyline  25mg  for sleep (stop when runs out of #10 04/2018(, zoloft  100-->150mg --> 200mg    Mild neurocognitive disorder 06/21/2019   Neuropathy (HCC) 11/02/2011   Nocturia    Peripheral vascular disease (HCC) LOWER EXTREMITIES   Spinal stenosis in cervical region 03/21/2010   Squamous cell skin cancer, penis: glans (HCC) 05/2010   Initial excision 11/11; recurrence, excision and laser Rx 9/13   Thrombocytopenia (HCC) 05/23/2012   Urinary frequency 09/05/2009   Possible BPH- see Trudi Fus notes    Vitamin D   deficiency 08/15/2008   Past Surgical History:  Procedure Laterality Date   CIRCUMCISION/ LASER DISSECTION PENILE GLANS CANCER  06-02-2010   CYSTOSCOPY WITH URETHRAL DILATATION  03/28/2012   Procedure: CYSTOSCOPY WITH URETHRAL DILATATION;  Surgeon: Edmund Gouge, MD;  Location[facility];  Service: Urology;  Laterality: N/A;  excision biopsy extensive meatal penile carcinoma meatoplasty   EXCISION RIGHT WRIST BENIGN TUMOR  2002  (APPROX)   hip surgery     INCISION AND DRAINAGE DEEP NECK ABSCESS     INGUINAL HERNIA REPAIR  1970's   KNEE ARTHROSCOPY Right 2017   LUMBAR LAMINECTOMY/DECOMPRESSION MICRODISCECTOMY N/A 05/24/2020   Procedure: OPEN LAMINECTOMY LUMBAR THREE-LUMBAR FOUR;  Surgeon: Pincus Bridgeman, DO;  Location: [facility] OR;  Service: Neurosurgery;  Laterality: N/A;   PACEMAKER IMPLANT N/A 02/08/2017   Procedure: Pacemaker Implant;  Surgeon: Verona Goodwill, MD;  Location: [facility] INV[facility] CV LAB;  Service: Cardiovascular;  Laterality: N/A;   PACEMAKER IMPLANT  02/08/2017   SJM Assurity MRI dual chamber PPM implanted by Dr Rodolfo Clan for complete heart block   PENILE BX  05-09-2010   TONSILLECTOMY     TRANSURETHRAL RESECTION OF PROSTATE N/A 05/10/2015   Procedure: CYSTOSCOPY, URETHRAL MEATAL DILATION, TRANSURETHRAL RESECTION OF THE PROSTATE (TURP);  Surgeon: Annamarie Kid, MD;  Location: [facility] ORS;  Service: Urology;  Laterality: N/A;   Social History:  reports that he has never smoked. He has never been exposed to tobacco smoke. He has never used smokeless tobacco. He reports current alcohol use. He reports that he does not use drugs.  No Known Allergies  Family History  Problem Relation Age of Onset   Lung cancer Mother    Arthritis Other        family hx   Hyperlipidemia Other        family hx   Hypertension Other        family hx   Prostate cancer Other        family hx    Prior to Admission medications   Medication Sig Start Date End Date Taking? Authorizing Provider  albuterol  (VENTOLIN  HFA) 108 (90 Base) MCG/ACT inhaler Inhale 2 puffs into the lungs every 8 (eight) hours as needed for wheezing or shortness of breath.   Yes [provider]  amLODipine  (NORVASC ) 2.5 MG tablet TAKE 1 TABLET BY MOUTH DAILY 03/24/23  Yes Almira Jaeger, MD  aspirin  81 MG chewable tablet Chew 81 mg by mouth daily.    Yes [provider]  atorvastatin  (LIPITOR) 20 MG tablet TAKE 1 TABLET BY MOUTH  DAILY Patient taking differently: Take 20 mg by mouth at bedtime. 06/22/22  Yes Almira Jaeger, MD  cyanocobalamin  (VITAMIN B12) 1000 MCG/ML injection Inject 1,000 mcg into the muscle every 30 (thirty) days.   Yes [provider]  divalproex  (DEPAKOTE ) 125 MG DR tablet Take 125 mg by mouth 3 (three) times daily. 08/03/23  Yes [provider]  ferrous sulfate  325 (65 FE) MG tablet Take 325 mg by mouth daily with breakfast.   Yes [provider]  furosemide  (LASIX ) 80 MG tablet Take 1 tablet (80 mg total) by mouth daily. 05/31/23  Yes Laurann Pollock, MD  levothyroxine  (SYNTHROID ) 75 MCG tablet TAKE 1 TABLET BY MOUTH EVERY MORNING 01/13/23  Yes Almira Jaeger, MD  losartan  (COZAAR ) 50 MG tablet TAKE 1 TABLET BY MOUTH DAILY 01/13/23  Yes Almira Jaeger, MD  OLANZapine  (ZYPREXA ) 2.5 MG tablet Take 2.5 mg by mouth in  the morning and at bedtime. 08/20/23  Yes [provider]  omeprazole  (PRILOSEC) 20 MG capsule Take 20 mg by mouth daily. 08/23/23  Yes [provider]  sennosides-docusate sodium  (SENOKOT-S) 8.6-50 MG tablet Take 2 tablets by mouth daily.   Yes [provider]  sertraline  (ZOLOFT ) 100 MG tablet TAKE 2 TABLETS BY MOUTH DAILY 11/25/22  Yes Almira Jaeger, MD  ciprofloxacin  (CILOXAN ) 0.3 % ophthalmic solution Place 2 drops into both eyes every 4 (four) hours while awake. Patient not taking: Reported on 08/24/2023 08/20/23   [provider]  Cyanocobalamin  (B-12 PO) Take 1 tablet by mouth daily. Patient not taking: Reported on 05/31/2023    [provider]  Vitamin D , Ergocalciferol , (DRISDOL ) 1.25 MG (50000 UNIT) CAPS capsule TAKE 1 CAPSULE BY MOUTH EVERY 7  DAYS 08/30/23   Almira Jaeger, MD    Physical Exam: Vitals:   11/23/23 0600 11/23/23 0645 11/23/23 0700 11/23/23 0735  BP: (!) 91/44  (!) 96/46 94/82  Pulse: (!) 59 70 60 60  Resp:  19 13 12   Temp:  (!) 95.5 F (35.3 C) (!) 97.1 F (36.2 C) (!) 97.2 F  (36.2 C)  TempSrc:      SpO2: 99% 97% 95% 97%  Weight:      Height:       Physical Exam Constitutional:      Appearance: He is normal weight.     Comments: Sleeping, responsive to sternal rub   HENT:     Head: Normocephalic.     Nose: Nose normal.     Mouth/Throat:     Mouth: Mucous membranes are dry.  Eyes:     Pupils: Pupils are equal, round, and reactive to light.  Cardiovascular:     Rate and Rhythm: Normal rate and regular rhythm.  Abdominal:     General: Abdomen is flat.     Comments: + generalized abdominal tenderness to palpation    Musculoskeletal:        General: Normal range of motion.  Skin:    General: Skin is warm.  Neurological:     General: No focal deficit present.  Psychiatric:        Mood and Affect: Mood normal.     Data Reviewed:  There are no new results to review at this time.  CT ABDOMEN PELVIS WO CONTRAST CLINICAL DATA:  Abdominal pain acute nonlocalized  EXAM: CT ABDOMEN AND PELVIS WITHOUT CONTRAST  TECHNIQUE: Multidetector CT imaging of the abdomen and pelvis was performed following the standard protocol without IV contrast.  RADIATION DOSE REDUCTION: This exam was performed according to the departmental dose-optimization program which includes automated exposure control, adjustment of the mA and/or kV according to patient size and/or use of iterative reconstruction technique.  COMPARISON:  March 01, 2012  FINDINGS: Lower chest: Extensive coronary artery calcifications  Hepatobiliary: Liver normal size without parenchymal lesions. Gallbladder mildly distended with gallstones without gallbladder inflammatory changes could correlate with chronic cholelithiasis.  Pancreas: Unremarkable. No pancreatic ductal dilatation or surrounding inflammatory changes.  Spleen: Normal in size without focal abnormality.  Adrenals/Urinary Tract: Adrenal glands are unremarkable. Kidneys are normal, without renal calculi, focal lesion, or  hydronephrosis. Bladder is unremarkable. Exophytic cyst in the posterior cortical region of the left kidney lower pole 1.4 cm. Enter Bosniak 1  Stomach/Bowel: Stomach is within normal limits. Appendix appears normal. No evidence of bowel wall thickening, distention, or inflammatory changes. No evidence of appendicitis. No evidence of diverticulitis. Moderate amount of residual  fecal material throughout colon and in fecal impaction rectum without obstruction  Vascular/Lymphatic: Aortic atherosclerosis. No enlarged abdominal or pelvic lymph nodes.  Reproductive: Prostate is unremarkable.  Other: No abdominal wall hernia or abnormality. No abdominopelvic ascites.  Musculoskeletal: No acute or significant osseous findings.  IMPRESSION: *Moderate amount of residual fecal material throughout colon and in fecal impaction rectum without obstruction. *Cholelithiasis without evidence of acute cholecystitis. *Aortic atherosclerosis.  Electronically Signed   By: Fredrich Jefferson M.D.   On: 11/23/2023 08:50 CT CHEST WO CONTRAST CLINICAL DATA:  89 year old male with altered mental status. Respiratory illness.  EXAM: CT CHEST WITHOUT CONTRAST  TECHNIQUE: Multidetector CT imaging of the chest was performed following the standard protocol without IV contrast.  RADIATION DOSE REDUCTION: This exam was performed according to the departmental dose-optimization program which includes automated exposure control, adjustment of the mA and/or kV according to patient size and/or use of iterative reconstruction technique.  COMPARISON:  Chest CT 01/23/2023. Portable chest radiograph 0607 hours today.  CT Abdomen and Pelvis 2013.  FINDINGS: Cardiovascular: Chronic left chest cardiac pacemaker. Calcified aortic atherosclerosis. calcified coronary artery atherosclerosis. Mild cardiomegaly appears increased since last year. No pericardial effusion. Vascular patency is not evaluated in the absence  of IV contrast.  Mediastinum/Nodes: Negative for mediastinal mass or lymphadenopathy.  Lungs/Pleura: Lower lung volumes compared to last year. Major airways remain patent. Generalized bronchial wall thickening Cha peers new or increased from last year (such as in both lower lobes on series 3, image 80. Chronic subpleural lung scarring. Superimposed increased but symmetric dependent atelectasis. No consolidation. No pleural effusion. No convincing pulmonary edema.  Upper Abdomen: Chronic elevation of the right hemidiaphragm. Mildly nodular liver contour. Small but circumscribed 10 mm low-density area along the inferior right hepatic lobe series 2, image 136. This was apparent on a 2013 CT Abdomen and Pelvis, stable and benign (no follow-up imaging recommended). faint dependent tiny gallstones or sludge series 2, image 137.  Negative visible noncontrast pancreas, adrenal glands, bowel in the upper air abdomen  In the upper abdomen. Splenomegaly is partially visible (coronal image 109).  Musculoskeletal: Thoracic spine degeneration with bulky endplate osteophytes and occasional chronic thoracic interbody ankylosis. No acute or suspicious osseous lesion.  IMPRESSION: 1. Lower lung volumes with atelectasis, chronic subpleural lung scarring. But also evidence of generally increased bronchial wall thickening. Consider Acute Bronchitis. No consolidation or pleural effusion. 2. Partially visible splenomegaly, and partially visible nodular liver contour - constellation suspicious for Cirrhosis with portal venous hypertension. Correlation with LFTs might be valuable. 3. Mild cardiomegaly appears increased since last year. Chronic cardiac pacemaker. Aortic Atherosclerosis (ICD10-I70.0).  Electronically Signed   By: Marlise Simpers M.D.   On: 11/23/2023 08:36 CT Head Wo Contrast CLINICAL DATA:  Mental status changes.  06/06/2023  EXAM: CT HEAD WITHOUT CONTRAST  TECHNIQUE: Contiguous  axial images were obtained from the base of the skull through the vertex without intravenous contrast.  RADIATION DOSE REDUCTION: This exam was performed according to the departmental dose-optimization program which includes automated exposure control, adjustment of the mA and/or kV according to patient size and/or use of iterative reconstruction technique.  COMPARISON:  None Available.  FINDINGS: Brain: There is no evidence for acute hemorrhage, hydrocephalus, mass lesion, or abnormal extra-axial fluid collection. No definite CT evidence for acute infarction. Diffuse loss of parenchymal volume is consistent with atrophy. Patchy low attenuation in the deep hemispheric and periventricular white matter is nonspecific, but likely reflects chronic microvascular ischemic demyelination.  Vascular: No hyperdense vessel  or unexpected calcification.  Skull: No evidence for fracture. No worrisome lytic or sclerotic lesion.  Sinuses/Orbits: Paranasal sinuses are clear. Small right mastoid effusion is stable.  Other: None.  IMPRESSION: 1. No acute intracranial abnormality. 2. Atrophy with chronic small vessel ischemic disease. 3. Stable small right mastoid effusion.  Electronically Signed   By: Donnal Fusi M.D.   On: 11/23/2023 07:24 DG Chest Port 1 View CLINICAL DATA:  Weakness  EXAM: PORTABLE CHEST 1 VIEW  COMPARISON:  10/30/2023  FINDINGS: Mild cardiomegaly. Left chest multi lead pacer. Unchanged mild, diffuse interstitial opacity and possible small layering pleural effusions. No acute osseous findings.  IMPRESSION: Mild cardiomegaly with unchanged mild, diffuse interstitial opacity and possible small layering pleural effusions, most consistent with edema. No focal airspace opacity.  Electronically Signed   By: Fredricka Jenny M.D.   On: 11/23/2023 06:52  Lab Results  Component Value Date   WBC 5.1 11/23/2023   HGB 14.5 11/23/2023   HCT 45.2 11/23/2023   MCV 84.3  11/23/2023   PLT 100 (L) 11/23/2023   Last metabolic panel Lab Results  Component Value Date   GLUCOSE 95 11/23/2023   NA 135 11/23/2023   K 3.6 11/23/2023   CL 100 11/23/2023   CO2 25 11/23/2023   BUN 34 (H) 11/23/2023   CREATININE 1.53 (H) 11/23/2023   GFRNONAA 44 (L) 11/23/2023   CALCIUM  9.1 11/23/2023   PHOS 3.2 01/24/2023   PROT 6.2 (L) 11/23/2023   ALBUMIN 3.6 11/23/2023   BILITOT 0.8 11/23/2023   ALKPHOS 87 11/23/2023   AST 44 (H) 11/23/2023   ALT 30 11/23/2023   ANIONGAP 10 11/23/2023    Assessment and Plan: Hypothermia Borderline SIRS Temperature 95.5 today Meeting borderline SIRS criteria with hypothermia as well as respirations at 20 White count within normal limits Lactate normal Will check TSH as well as cortisol level Noted to be on Zyprexa  with side effect profile including hypothermia Zyprexa  level 78 today (range 50-100) Consider holding offending agent in conjunction with psychiatry evaluation CT chest, abdomen and pelvis pending to rule out an infectious etiology  Low threshold for broad-spectrum antibiotic coverage given age and overall presentation Monitor  Heart failure with preserved ejection fraction (HFpEF)  2D echo May 2024 with EF of 50 to 55% and grade 1 diastolic dysfunction Appears to be near baseline versus trace volume overload on evaluation today Will hold diuretic as well as antihypertensive regimen in the setting of borderline hypotension Monitor volume status closely with treatment Consult cardiology as appropriate Follow-up  Dementia (HCC) Baseline agitation in setting of dementia Management per psychiatric team  Complete heart block (HCC) Status post pacemaker  CKD (chronic kidney disease) Creatinine 1.5 today with GFR in the 50s Appears near baseline Monitor  GERD (gastroesophageal reflux disease) PPI  Essential hypertension Systolic pressures 90s on presentation with overlapping hypothermia Hold BP regimen  including Norvasc  Hold diuretic in the setting of HFpEF Monitor  Mixed hyperlipidemia Continue Lipitor pending CK level  Acquired hypothyroidism Continue Synthroid  Check TSH in the setting of hypothermia      Advance Care Planning:   Code Status: Full Code   Consults: None   Family Communication: No family at the bedside   Severity of Illness: The appropriate patient status for this patient is OBSERVATION. Observation status is judged to be reasonable and necessary in order to provide the required intensity of service to ensure the patient's safety. The patient's presenting symptoms, physical exam findings, and initial radiographic and laboratory data  in the context of their medical condition is felt to place them at decreased risk for further clinical deterioration. Furthermore, it is anticipated that the patient will be medically stable for discharge from the hospital within 2 midnights of admission.   Author: Corrinne Din, MD 11/23/2023 8:09 AM  For on call review [email or web address].

## 2023-11-23 NOTE — ED Notes (Signed)
 EDP notified of Pts declining and increased AMS. EDP at bedside to assess Pt, see new orders.

## 2023-11-23 NOTE — ED Notes (Signed)
 Pericare applied after in and out cath, Pt cleand up, rectal temp probe applied, and sheets changed. Sacral foam dressing applied. Pt hooked up to monitor, next to nurses station as Pt doesn't know how to push call button d/t dementia.

## 2023-11-23 NOTE — Assessment & Plan Note (Addendum)
 Held PPI

## 2023-11-23 NOTE — Assessment & Plan Note (Addendum)
 2D echo May 2024 with EF of 50 to 55% and grade 1 diastolic dysfunction.  No signs of heart failure.

## 2023-11-23 NOTE — ED Notes (Signed)
vol/toc placement.. 

## 2023-11-23 NOTE — Assessment & Plan Note (Addendum)
 Held blood pressure medication

## 2023-11-23 NOTE — Progress Notes (Signed)
 PT Cancellation Note  Patient Details Name: Hunter [name] MRN: [id number] DOB: August 08, 1933   Cancelled Treatment:    Reason Eval/Treat Not Completed: Patient at procedure or test/unavailable (Chart reviewed for planned treatment session.  Patient currently off unit for imaging.  Will re-attempt at later time/date as medically appropriate and available.)  Of note, transferred to medical unit overnight due to SIRS (unknown etiology); work-up ongoing.  Stoney Karczewski H. Bevin Bucks, PT, DPT, NCS 11/23/23, 11:47 AM [phone]

## 2023-11-23 NOTE — Assessment & Plan Note (Addendum)
 Held Synthroid 

## 2023-11-23 NOTE — Assessment & Plan Note (Signed)
 Status post pacemaker

## 2023-11-23 NOTE — Progress Notes (Signed)
 Physical Therapy Treatment Patient Details Name: Hunter [name] MRN: [id number] DOB: 12-Mar-1934 Today's Date: 11/23/2023   History of Present Illness Pt is an 89 y/o M admitted on 08/24/23 under IVC for aggression with staff & behavioral challenges (per psych, due to baseline dementia) at his facility. PMH: chronic LBP, CKD 3, essential HTN, HLD, L BBB, major depression, neuropathy, PVD.  Course complicated by hypothermia; transferred to med-surg floor (11/23/23) for management of SIRS, unknown etiology.    PT Comments  Patient lying naked in bed upon arrival to room, constantly pulling at covers.  Limited ability to redirect this date; intermittently resistant to hand-over-hand guidance by therapist. Maintains eyes partially closed (to fully closed) throughout session; limited interaction with therapist compared to previous visits.  Continues to speak tangentially, unable to effectively participate with purposeful conversation or follow commands.   Max/dep assist for all repositioning, mobility and feeding tasks incorporated into session.  Do question impact of medications (ativan  administered 1118 per MAR).  Will plan next session for AM and around medication as appropriate to promote optimal participation/progression.  Goals updated to reflect current status; POC update to reflect continued hospitalization and therapy plan/efforts.     If plan is discharge home, recommend the following: Assistance with cooking/housework;Direct supervision/assist for medications management;Assist for transportation;Help with stairs or ramp for entrance;Supervision due to cognitive status;A lot of help with walking and/or transfers;A lot of help with bathing/dressing/bathroom   Can travel by private vehicle     No  Equipment Recommendations  Rolling walker (2 wheels);Wheelchair (measurements PT);Wheelchair cushion (measurements PT);BSC/3in1;Hospital bed    Recommendations for Other Services        Precautions / Restrictions Precautions Precautions: Fall Recall of Precautions/Restrictions: Impaired Precaution/Restrictions Comments: hx of restlessness, agitation and aggression. however cooperative, & pleasant with therapy Restrictions Weight Bearing Restrictions Per Provider Order: No     Mobility  Bed Mobility Overal bed mobility: Needs Assistance Bed Mobility: Rolling (Repositioning) Rolling: Max assist              Transfers                   General transfer comment: unsafe/unable    Ambulation/Gait               General Gait Details: unsafe/unable   Stairs             Wheelchair Mobility     Tilt Bed    Modified Rankin (Stroke Patients Only)       Balance                                            Communication    Cognition Arousal: Lethargic, Suspect due to medications Behavior During Therapy: Restless   PT - Cognitive impairments: History of cognitive impairments                           Following commands impaired: Follows one step commands inconsistently    Cueing    Exercises Other Exercises Other Exercises: Patient with constant fidgeting, restlessness throughout session; intermittently resistant to redirection.  Limited response to hand-over-hand facilitation this date. Other Exercises: Noted with untouched meal tray at bedside; assisted with meal tray as appropriate (ate pudding, magic cup, few bites of pot roast/mashed potatoes).  Dep assist to manage utentil (unable to hold/manipulate this  date) and participate with PO intake.    General Comments        Pertinent Vitals/Pain Pain Assessment Pain Assessment: No/denies pain    Home Living                          Prior Function            PT Goals (current goals can now be found in the care plan section) Acute Rehab PT Goals Patient Stated Goal: none stated PT Goal Formulation: With patient Time For Goal  Achievement: 12/23/23 Potential to Achieve Goals: Fair Progress towards PT goals: Goals downgraded-see care plan    Frequency    Min 1X/week      PT Plan      Co-evaluation              AM-PAC PT "6 Clicks" Mobility   Outcome Measure  Help needed turning from your back to your side while in a flat bed without using bedrails?: A Lot Help needed moving from lying on your back to sitting on the side of a flat bed without using bedrails?: Total Help needed moving to and from a bed to a chair (including a wheelchair)?: Total Help needed standing up from a chair using your arms (e.g., wheelchair or bedside chair)?: Total Help needed to walk in hospital room?: Total Help needed climbing 3-5 steps with a railing? : Total 6 Click Score: 7    End of Session   Activity Tolerance: Patient limited by lethargy Patient left: in bed;with call bell/phone within reach;with bed alarm set Nurse Communication: Mobility status PT Visit Diagnosis: Unsteadiness on feet (R26.81);Muscle weakness (generalized) (M62.81);Other abnormalities of gait and mobility (R26.89);Difficulty in walking, not elsewhere classified (R26.2);History of falling (Z91.81)     Time: 1610-9604 PT Time Calculation (min) (ACUTE ONLY): 27 min  Charges:    $Therapeutic Activity: 23-37 mins PT General Charges $$ ACUTE PT VISIT: 1 Visit                     Velena Keegan H. Bevin Bucks, PT, DPT, NCS 11/23/23, 4:33 PM [phone]

## 2023-11-23 NOTE — ED Provider Notes (Signed)
-----------------------------------------   5:11 AM on 11/23/2023 -----------------------------------------   Called to bedside for patient more lethargic than usual.  Will recheck vital signs, sepsis workup including ammonia, CT head, chest x-ray, respiratory panel.   ED ECG REPORT I, Dearia Wilmouth J, the attending physician, personally viewed and interpreted this ECG.   Date: 11/23/2023  EKG Time: 0620  Rate: 63  Rhythm: normal sinus rhythm  Axis: LAD  Intervals:nonspecific intraventricular conduction delay  ST&T Change: Nonspecific No change from prior  ----------------------------------------- 7:03 AM on 11/23/2023 -----------------------------------------   Care transferred to Dr. Valetta Gaudy at change of shift. CXR clear. Pending rest of lab work, UA.  Anticipate hospitalization for AMS, lethargy, hypothermia.   Unity Luepke J, MD 11/23/23 [phone]

## 2023-11-23 NOTE — Assessment & Plan Note (Addendum)
 Held Lipitor

## 2023-11-23 NOTE — Progress Notes (Signed)
 CT imaging and abd u/s concerning for cholelithiasis and distended GB.  LFTs and T bili WNL  Will reach out to general surgery for consultation  Follow

## 2023-11-23 NOTE — ED Notes (Signed)
 Pt placed on monitoring d/t meds received for agitation, Pt seemed a little disoriented than usual and cool to touch.

## 2023-11-23 NOTE — Assessment & Plan Note (Addendum)
 Baseline agitation in setting of dementia

## 2023-11-23 NOTE — Progress Notes (Signed)
 Pharmacy Quality Measure Review  This patient is appearing on a report for being at risk of failing the adherence measure for hypertension (ACEi/ARB) medications this calendar year.   Medication: losartan  Last fill date: 08/13/2023 for 30 day supply  Patient is currently boarding in ER / admitted since 08/24/2023 - He continues to received losartan  in ED / hospital  No action needed to this time.    Cecilie Coffee, PharmD Clinical Pharmacist [facility]  Population Health [phone]

## 2023-11-23 NOTE — Progress Notes (Signed)
 Patient combative, hitting, kicking, cursing and trying to bite nurses. Unable to give po meds. Patient given Ativan  IM for CT scan, nurse will attempt to start IV fluids and IV abx when patient returns from MRI.

## 2023-11-23 NOTE — Plan of Care (Signed)
   Problem: Safety: Goal: Ability to remain free from injury will improve Outcome: Progressing

## 2023-11-23 NOTE — ED Provider Notes (Signed)
   Vitals:   11/23/23 0645 11/23/23 0700  BP:  (!) 96/46  Pulse: 70 60  Resp: 19 13  Temp: (!) 95.5 F (35.3 C) (!) 97.1 F (36.2 C)  SpO2: 97% 95%      Patient blood pressure currently 88 systolic.  He is rather lethargic, opens eyes to loud voice.  He opens his eyes slightly.  He was noted to be hypothermic and hypotensive on earlier rounding.  He had workup initiated at that time.  Urinalysis shows no clear evidence of infection.  CBC without acute departure.  Lactic acid normal reassuring against severe metabolic abnormality.  CMP shows baseline creatinine  Patient's mucous membranes seems just slightly dry.  He alerts to voice, when awaken him by his name he responds that that is my name" but is otherwise quite fastidious in his rest.  Pupils equal round reactive to light.  He requires stimulation including loud verbal and touch to maintain eye contact.  Based on previous history this is clearly departure from his baseline.  Due to concerns of developing hypotension hypothermia additional lab testing including ammonia, TSH is pending.  Valproate level has resulted normal  I have requested the patient be admitted to the hospitalist services at this point the patient has had a significant change from his baseline mentation, is not hypotensive borderline hypothermic and no clear cause has yet been identified in the emergency department.  CT Head Wo Contrast Result Date: 11/23/2023 CLINICAL DATA:  Mental status changes.  06/06/2023 EXAM: CT HEAD WITHOUT CONTRAST TECHNIQUE: Contiguous axial images were obtained from the base of the skull through the vertex without intravenous contrast. RADIATION DOSE REDUCTION: This exam was performed according to the departmental dose-optimization program which includes automated exposure control, adjustment of the mA and/or kV according to patient size and/or use of iterative reconstruction technique. COMPARISON:  None Available. FINDINGS: Brain: There is no  evidence for acute hemorrhage, hydrocephalus, mass lesion, or abnormal extra-axial fluid collection. No definite CT evidence for acute infarction. Diffuse loss of parenchymal volume is consistent with atrophy. Patchy low attenuation in the deep hemispheric and periventricular white matter is nonspecific, but likely reflects chronic microvascular ischemic demyelination. Vascular: No hyperdense vessel or unexpected calcification. Skull: No evidence for fracture. No worrisome lytic or sclerotic lesion. Sinuses/Orbits: Paranasal sinuses are clear. Small right mastoid effusion is stable. Other: None. IMPRESSION: 1. No acute intracranial abnormality. 2. Atrophy with chronic small vessel ischemic disease. 3. Stable small right mastoid effusion. Electronically Signed   By: Donnal Fusi M.D.   On: 11/23/2023 07:24   DG Chest Port 1 View Result Date: 11/23/2023 CLINICAL DATA:  Weakness EXAM: PORTABLE CHEST 1 VIEW COMPARISON:  10/30/2023 FINDINGS: Mild cardiomegaly. Left chest multi lead pacer. Unchanged mild, diffuse interstitial opacity and possible small layering pleural effusions. No acute osseous findings. IMPRESSION: Mild cardiomegaly with unchanged mild, diffuse interstitial opacity and possible small layering pleural effusions, most consistent with edema. No focal airspace opacity. Electronically Signed   By: Fredricka Jenny M.D.   On: 11/23/2023 06:52     The patient will require ongoing medical workup. Accepted to hospitalist service by Dr. Darline Eis, MD 11/23/23 2016

## 2023-11-23 NOTE — Consult Note (Signed)
 [facility] Consult Follow-up  Patient Name: .Hunter [name]  MRN: [id number]  DOB: 1933/08/25  Consult Order details:  Orders (From admission, onward)     Start     Ordered   11/19/23 1501  IP CONSULT TO PSYCHIATRY       Ordering Provider: Lind Repine, MD  Provider:  (Not yet assigned)  Question Answer Comment  Place call to: Psych NP   Reason for Consult Consult      11/19/23 1500   10/15/23 1359  IP CONSULT TO PSYCHIATRY       Ordering Provider: Viviano Ground, MD  Provider:  (Not yet assigned)  Question Answer Comment  Consult Timeframe URGENT - requires response within 12 hours   URGENT timeframe requires provider to provider communication, has the provider to provider communication been completed Yes   Reason for Consult? Consult for medication management   Contact phone number where the requesting provider can be reached [phone]      10/15/23 1358   10/09/23 0238  IP CONSULT TO PSYCHIATRY       Ordering Provider: Dewitt Forehand, DO  Provider:  (Not yet assigned)  Question Answer Comment  Consult Timeframe URGENT - requires response within 12 hours   URGENT timeframe requires provider to provider communication, has the provider to provider communication been completed Yes   Reason for Consult? Consult for medication management due to increasing agitation, not improving withy last medication adjustments 10/04/23   Contact phone number where the requesting provider can be reached [phone]      10/09/23 0238   10/08/23 1128  IP CONSULT TO PSYCHIATRY       Comments: Pt depakote  level being checked and psych needs to review/manage meds as agitation has continued.  Ordering Provider: Shane Darling, MD  Provider:  (Not yet assigned)  Question Answer Comment  Place call to: psych   Reason for Consult Other (See Comments) Medication mgmt  Diagnosis/Clinical Info for Consult: continued aggressive behavior      10/08/23 1132   08/24/23 0045  IP CONSULT TO  PSYCHIATRY       Ordering Provider: Norlene Beavers, MD  Provider:  (Not yet assigned)  Question Answer Comment  Place call to: psychiatry   Reason for Consult Consult   Diagnosis/Clinical Info for Consult: aggressive behavior      08/24/23 0044             Mode of Visit: In person    Psychiatry Consult Evaluation  Service Date: Nov 23, 2023 LOS:  LOS: 0 days  Chief Complaint "leave me alone"  Chart review: Hunter ODOMS is a 89 y.o. male admitted: Medicallyfor 08/24/2023 12:14 AM with hx of dementia brought in to ED for agitation form a facility. Patient has been in ED waiting for placement. Psychiatry is requested to get involved again as patient's agitation has been getting worse.   11/21/23: patient is responding well to titration of Depakote , zyprexa  and klonopin. Will continue to monitor the behaviors.   Sensory Deprivation: Legal guardian wants to bring patient's hearing aids and this provider recommended to bring in hearing aids as sensory deprivation can make confusion worse. EKG 11/19/23- QTC 539- discontinued PRN zyprexa  and increased ativan  2 mg Q6 hr PRN.   Discussed extensively with the legal guardian about the risk benefits of the current treatment in the context of patient's uncontrollable agitation and QTc prolongation and EKG.  Legal guardian expressed her understanding and gave consent for the  medication adjustments.  Face-to-face follow up assessment: Patient in bed awake. Guarded, restless, anxious. Resistant to care. He is combative, cursing. He has poor eye contact. Speech is pressured. He is disoriented to time and situations. His speech is incoherent and his thought process is disorganized, illogical. Patient is newly admitted to the unit.   Due to his prolonged Qtc, we are discontinuing  Zyprexa . Ativan  is ordered to manage agitations and aggressive behaviors.      Primary Psychiatric Diagnoses  Aggressive Behavior related to Dementia 2.  Dementia 3.   MDD  Assessment  Hunter [name] is a 89 y.o. male admitted: Presented to the EDfor 08/24/2023 12:14 AM for aggressive behaviors. He carries the psychiatric diagnoses of Dementia and has a past medical history of  HF, CKD, HTN, GERD.   His current presentation of agitations and aggressive behaviors  is most consistent with his psychogenic condition. He meets criteria for medications   to manage his current behaviors .  NO Current outpatient psychotropic medications.  Patient is a resident at a nursing home where his medications are managed and administered as recommended. Please see plan below for detailed recommendations.   Diagnoses:  Active Hospital problems: Principal Problem:   Aggressive behavior due to dementia [facility]) Active Problems:   Acquired hypothyroidism   Mixed hyperlipidemia   Essential hypertension   GERD (gastroesophageal reflux disease)   CKD (chronic kidney disease)   Heart failure with preserved ejection fraction (HFpEF)    Complete heart block (HCC)   Dementia (HCC)   Physically aggressive behavior   Hypothermia   Pressure injury of skin    Plan   ## Psychiatric Medication Recommendations:  Ativan  1 mg IV Q 6 PRN  ## Medical Decision Making Capacity: Patient has a guardian and has thus been adjudicated incompetent; please involve patients guardian in medical decision making  ## Further Work-up:  -- Continue to monitor   -- most recent EKG on 11/23/2023 had a prolonged QTC -- Pertinent labwork reviewed earlier this admission includes: All labs reviwed   ## Disposition:-- There are no psychiatric contraindications to discharge at this time  ## Behavioral / Environmental: - No specific recommendations at this time.     ## Safety and Observation Level:  - Based on my clinical evaluation, I estimate the patient to be at llow  risk of self harm in the current setting. - At this time, we recommend  routine. This decision is based on my review of the chart  including patient's history and current presentation, interview of the patient, mental status examination, and consideration of suicide risk including evaluating suicidal ideation, plan, intent, suicidal or self-harm behaviors, risk factors, and protective factors. This judgment is based on our ability to directly address suicide risk, implement suicide prevention strategies, and develop a safety plan while the patient is in the clinical setting. Please contact our team if there is a concern that risk level has changed.  CSSR Risk Category:C-SSRS RISK CATEGORY: No Risk  Suicide Risk Assessment: Patient has following modifiable risk factors for suicide: recklessness, which we are addressing by recommending medications for agitation/aggressive behaviors. Patient has following non-modifiable or demographic risk factors for suicide: male gender Patient has the following protective factors against suicide: no history of suicide attempts and no history of NSSIB  Thank you for this consult request. Recommendations have been communicated to the primary team.  We will continue to monitor/administer medications to manage aggressive behaviors at this time.   Elston Halsted, NP  History of Present Illness  Relevant Aspects of [facility] Course:  Admitted on 08/24/2023 for aggressive behaviors..   Patient Report:  "Leave me alone"  Psych ROS:  Depression: hx Anxiety:  current Mania (lifetime and current): NA Psychosis: (lifetime and current): NA  Collateral information:  Contacted Guardian [name] (DSS) [phone] and updates provided  Review of Systems  Constitutional: Negative.   HENT: Negative.    Eyes: Negative.   Respiratory: Negative.    Cardiovascular: Negative.   Gastrointestinal: Negative.   Genitourinary: Negative.   Musculoskeletal: Negative.   Skin: Negative.   Neurological: Negative.   Endo/Heme/Allergies: Negative.   Psychiatric/Behavioral:  The  patient is nervous/anxious.      Psychiatric and Social History  Psychiatric History:  Information collected from Chart, Nursing and Guardian  Prev Dx/Sx: Dementia Current Psych Provider: NA Home Meds (current): NA Previous Med Trials: NA Therapy: NA  Prior Psych Hospitalization: NA  Prior Self Harm: NA Prior Violence: reported  Family Psych History: NA Family Hx suicide: NA  Social History:  Developmental Hx: NA Educational Hx: NA Occupational Hx: NA Legal Hx: NA Living Situation: Lives at a nursing home, has a legal guardian Spiritual Hx: NA Access to weapons/lethal means: NA   Substance History Alcohol: NA  Type of alcohol NA Last Drink NA Number of drinks per day NA History of alcohol withdrawal seizures NA History of DT's NA Tobacco: NA Illicit drugs: NA Prescription drug abuse: NA Rehab hx: NA  Exam Findings  Physical Exam:  Vital Signs:  Temp:  [95.5 F (35.3 C)-98.1 F (36.7 C)] 97.8 F (36.6 C) (05/13 0856) Pulse Rate:  [55-70] 58 (05/13 0856) Resp:  [12-20] 16 (05/13 0856) BP: (91-119)/(44-82) 117/57 (05/13 0856) SpO2:  [94 %-99 %] 94 % (05/13 0856) Blood pressure (!) 117/57, pulse (!) 58, temperature 97.8 F (36.6 C), resp. rate 16, height 6' (1.829 m), weight 97.5 kg, SpO2 94%. Body mass index is 29.16 kg/m.  Physical Exam Vitals and nursing note reviewed.  HENT:     Head: Normocephalic and atraumatic.     Right Ear: Tympanic membrane normal.     Left Ear: Tympanic membrane normal.     Nose: Nose normal.     Mouth/Throat:     Mouth: Mucous membranes are moist.  Eyes:     Pupils: Pupils are equal, round, and reactive to light.  Pulmonary:     Effort: Pulmonary effort is normal.  Musculoskeletal:        General: Normal range of motion.     Cervical back: Normal range of motion.  Neurological:     General: No focal deficit present.     Mental Status: He is alert. He is disoriented.     Mental Status Exam: General Appearance:  Disheveled  Orientation:  Other:  disoriented to time and situation  Memory:  Immediate;   Poor Recent;   Poor Remote;   Poor  Concentration:  Concentration: Poor and Attention Span: Poor  Recall:  Poor  Attention  Poor  Eye Contact:  Poor  Speech:  Pressured  Language:  Fair  Volume:  Increased  Mood: angry  Affect:  Constricted and Inappropriate  Thought Process:  Disorganized and Irrelevant  Thought Content:  Illogical and Obsessions  Suicidal Thoughts:  No  Homicidal Thoughts:  No  Judgement:  Impaired  Insight:  Lacking  Psychomotor Activity:  Restlessness  Akathisia:  NA  Fund of Knowledge:  UTA      Assets:  Social Support  Others:  health care services  Cognition:  Impaired,  Severe  ADL's:  Impaired  AIMS (if indicated):        Other History   These have been pulled in through the EMR, reviewed, and updated if appropriate.  Family History:  The patient's family history includes Arthritis in an other family member; Hyperlipidemia in an other family member; Hypertension in an other family member; Lung cancer in his mother; Prostate cancer in an other family member.  Medical History: Past Medical History:  Diagnosis Date   Allergic rhinitis 02/02/2007   Anemia    Arthritis    "knees" (02/08/2017)   Chronic lower back pain    CKD (chronic kidney disease), stage III (HCC) 05/27/2011   Complete heart block 02/08/2017   Eczema    Essential hypertension 02/02/2007   Lasix  60 mg, losartan  50mg ,  Amlodipine  5mg --> 2.5 mg.  Usually have to repeat BP measurements  In the past-Maxzide  37.5-25mg .   GERD (gastroesophageal reflux disease) occasional   Hearing loss of both ears    Heart failure with preserved ejection fraction (HFpEF) 12/06/2013   History of skin cancer 03/26/2014   nose    Hx of echocardiogram    Echo (07/2013): Mild LVH, EF 55-60%, grade 1 diastolic dysfunction, mild LAE, PASP 23   Hyperlipidemia    Hypertension    Hypothyroidism    LBBB (left bundle  branch block)    Left patella fracture 2018   "no OR" (02/08/2017)   Major depression in partial remission 02/02/2007   Amitriptyline  25mg  for sleep (stop when runs out of #10 04/2018(, zoloft  100-->150mg --> 200mg    Mild neurocognitive disorder 06/21/2019   Neuropathy (HCC) 11/02/2011   Nocturia    Peripheral vascular disease (HCC) LOWER EXTREMITIES   Spinal stenosis in cervical region 03/21/2010   Squamous cell skin cancer, penis: glans (HCC) 05/2010   Initial excision 11/11; recurrence, excision and laser Rx 9/13   Thrombocytopenia (HCC) 05/23/2012   Urinary frequency 09/05/2009   Possible BPH- see Trudi Fus notes    Vitamin D  deficiency 08/15/2008    Surgical History: Past Surgical History:  Procedure Laterality Date   CIRCUMCISION/ LASER DISSECTION PENILE GLANS CANCER  06-02-2010   CYSTOSCOPY WITH URETHRAL DILATATION  03/28/2012   Procedure: CYSTOSCOPY WITH URETHRAL DILATATION;  Surgeon: Edmund Gouge, MD;  Location: [facility];  Service: Urology;  Laterality: N/A;  excision biopsy extensive meatal penile carcinoma meatoplasty   EXCISION RIGHT WRIST BENIGN TUMOR  2002 (APPROX)   hip surgery     INCISION AND DRAINAGE DEEP NECK ABSCESS     INGUINAL HERNIA REPAIR  1970's   KNEE ARTHROSCOPY Right 2017   LUMBAR LAMINECTOMY/DECOMPRESSION MICRODISCECTOMY N/A 05/24/2020   Procedure: OPEN LAMINECTOMY LUMBAR THREE-LUMBAR FOUR;  Surgeon: Pincus Bridgeman, DO;  Location: [facility] OR;  Service: Neurosurgery;  Laterality: N/A;   PACEMAKER IMPLANT N/A 02/08/2017   Procedure: Pacemaker Implant;  Surgeon: Verona Goodwill, MD;  Location: [facility] INVASIVE CV LAB;  Service: Cardiovascular;  Laterality: N/A;   PACEMAKER IMPLANT  02/08/2017   SJM Assurity MRI dual chamber PPM implanted by Dr Rodolfo Clan for complete heart block   PENILE BX  05-09-2010   TONSILLECTOMY     TRANSURETHRAL RESECTION OF PROSTATE N/A 05/10/2015   Procedure: CYSTOSCOPY, URETHRAL MEATAL DILATION, TRANSURETHRAL RESECTION OF THE  PROSTATE (TURP);  Surgeon: Annamarie Kid, MD;  Location: [facility]S;  Service: Urology;  Laterality: N/A;     Medications:   Current Facility-Administered Medications:    0.9 %  sodium chloride  infusion, , Intravenous, Continuous, Daisey Dryer Alayne Allis, MD   acetaminophen  (TYLENOL ) tablet 650 mg, 650 mg, Oral, Q6H PRN, Corrinne Din, MD   albuterol  (PROVENTIL ) (2.5 MG/3ML) 0.083% nebulizer solution 2.5 mg, 2.5 mg, Inhalation, Q8H PRN, Buell Carmin, MD   amLODipine  (NORVASC ) tablet 2.5 mg, 2.5 mg, Oral, Daily, Buell Carmin, MD, 2.5 mg at 11/22/23 2536   aspirin  chewable tablet 81 mg, 81 mg, Oral, Daily, Buell Carmin, MD, 81 mg at 11/22/23 6440   atorvastatin  (LIPITOR) tablet 20 mg, 20 mg, Oral, QHS, Buell Carmin, MD, 20 mg at 11/22/23 2106   ceFEPIme  (MAXIPIME ) 2 g in sodium chloride  0.9 % 100 mL IVPB, 2 g, Intravenous, Q12H, Merrill, Kristin A, RPH   clonazePAM (KLONOPIN) disintegrating tablet 0.5 mg, 0.5 mg, Oral, BID, Jadapalle, Sree, MD, 0.5 mg at 11/22/23 2106   cyanocobalamin  (VITAMIN B12) injection 1,000 mcg, 1,000 mcg, Intramuscular, Q30 days, Buell Carmin, MD, 1,000 mcg at 11/21/23 1018   enoxaparin  (LOVENOX ) injection 40 mg, 40 mg, Subcutaneous, Q24H, Corrinne Din, MD   ferrous sulfate  tablet 325 mg, 325 mg, Oral, Q breakfast, Buell Carmin, MD, 325 mg at 11/22/23 3474   furosemide  (LASIX ) tablet 80 mg, 80 mg, Oral, Daily, Buell Carmin, MD, 80 mg at 11/22/23 2595   levothyroxine  (SYNTHROID ) tablet 75 mcg, 75 mcg, Oral, q morning, Buell Carmin, MD, 75 mcg at 11/22/23 6387   LORazepam  (ATIVAN ) injection 1 mg, 1 mg, Intravenous, PRN, Kaylaann Mountz M, NP   LORazepam  (ATIVAN ) tablet 2 mg, 2 mg, Oral, Q6H PRN, 2 mg at 11/22/23 1652 **OR** LORazepam  (ATIVAN ) injection 2 mg, 2 mg, Intramuscular, Q6H PRN, Jadapalle, Sree, MD   losartan  (COZAAR ) tablet 50 mg, 50 mg, Oral, Daily, Buell Carmin, MD, 50 mg at 11/22/23 0948   metroNIDAZOLE (FLAGYL) IVPB 500 mg, 500 mg, Intravenous, Q12H, Corrinne Din, MD   ondansetron  (ZOFRAN ) tablet 4 mg, 4 mg, Oral, Q6H PRN **OR** ondansetron  (ZOFRAN ) injection 4 mg, 4 mg, Intravenous, Q6H PRN, Corrinne Din, MD   pantoprazole  (PROTONIX ) EC tablet 40 mg, 40 mg, Oral, Daily, Buell Carmin, MD, 40 mg at 11/22/23 0947   sertraline  (ZOLOFT ) tablet 150 mg, 150 mg, Oral, Daily, Lee, Jacqueline Eun, NP, 150 mg at 11/22/23 0947   vancomycin  (VANCOREADY) IVPB 2000 mg/400 mL, 2,000 mg, Intravenous, Once, Scotty Cyphers, RPH  Allergies: No Known Allergies  Elston Halsted, NP

## 2023-11-23 NOTE — Progress Notes (Signed)
 Pharmacy Antibiotic Note  Hunter [name] is a 89 y.o. male admitted on 08/24/2023 with SIRS/hypothermia/unknown source.  Pharmacy has been consulted for Vancomycin  and Cefepime  dosing.  -pt has been holding in ED since 08/24/2023 (neurocognitive disorder), new lethargy/hypothermia 5/13, admit to floor -f/u source -MRSA PCR pending  Plan: - will order Cefepime  2gm IV q12h   for Crcl 41  -will order Vancomycin  2000 mg IV x 1 as Loading dose. Will continue with Vancomycin  1250 mg IV Q 24 hrs. Goal AUC 400-550. Expected AUC: 503 Cmin 14 SCr used: 1.53  Continue to assess renal fxn, cultures, length of therapy    Height: 6' (182.9 cm) Weight: 97.5 kg (215 lb) IBW/kg (Calculated) : 77.6  Temp (24hrs), Avg:97 F (36.1 C), Min:95.5 F (35.3 C), Max:98.1 F (36.7 C)  Recent Labs  Lab 11/19/23 1750 11/20/23 0444 11/23/23 0550  WBC  --  5.1 5.1  CREATININE 1.69*  --  1.53*  LATICACIDVEN  --   --  1.3    Estimated Creatinine Clearance: 41.2 mL/min (A) (by C-G formula based on SCr of 1.53 mg/dL (H)).    No Known Allergies  Antimicrobials this admission: cefepime  5/13 >>   vancomycin  5/13 >>   Metronidazole 5/13>>  Dose adjustments this admission:    Microbiology results: 5/13 BCx: NGTD 5/13 UCx: pending    Sputum:    5/13 MRSA PCR: pending CT ABD neg, CXR: ?bronchitis   Thank you for allowing pharmacy to be a part of this patient's care.  Thomasine Flick PharmD Clinical Pharmacist 11/23/2023

## 2023-11-23 NOTE — Assessment & Plan Note (Deleted)
 CKD stage IIIb.  Gentle fluids if able to get an IV.

## 2023-11-23 NOTE — Consult Note (Signed)
 Subjective:   CC: Gallstones  HPI:  Hunter [name] is a 89 y.o. male who is consulted by [facility] for evaluation of above cc.  Report received and chart reviewed for history due to patient's baseline mentation.  Patient noted to have increased lethargy and hypothermia via rectal and oral temp.  Workup concerning for possible sepsis, with gallbladder distention and cholelithiasis noted.  Surgery consulted for possible gallbladder as source of lethargy and hypothermia.     Past Medical History:  has a past medical history of Allergic rhinitis (02/02/2007), Anemia, Arthritis, Chronic lower back pain, CKD (chronic kidney disease), stage III (HCC) (05/27/2011), Complete heart block (02/08/2017), Eczema, Essential hypertension (02/02/2007), GERD (gastroesophageal reflux disease) (occasional), Hearing loss of both ears, Heart failure with preserved ejection fraction (HFpEF) (12/06/2013), History of skin cancer (03/26/2014), echocardiogram, Hyperlipidemia, Hypertension, Hypothyroidism, LBBB (left bundle branch block), Left patella fracture (2018), Major depression in partial remission (02/02/2007), Mild neurocognitive disorder (06/21/2019), Neuropathy (HCC) (11/02/2011), Nocturia, Peripheral vascular disease (HCC) (LOWER EXTREMITIES), Spinal stenosis in cervical region (03/21/2010), Squamous cell skin cancer, penis: glans (HCC) (05/2010), Thrombocytopenia (HCC) (05/23/2012), Urinary frequency (09/05/2009), and Vitamin D  deficiency (08/15/2008).  Past Surgical History:  has a past surgical history that includes Tonsillectomy; EXCISION RIGHT WRIST BENIGN TUMOR (2002 (APPROX)); Incision and drainage deep neck abscess; PENILE BX (05-09-2010); CIRCUMCISION/ LASER DISSECTION PENILE GLANS CANCER (06-02-2010); Cystoscopy with urethral dilatation (03/28/2012); Transurethral resection of prostate (N/A, 05/10/2015); Inguinal hernia repair (1970's); Knee arthroscopy (Right, 2017); PACEMAKER IMPLANT (N/A, 02/08/2017); PACEMAKER IMPLANT  (02/08/2017); hip surgery; and Lumbar laminectomy/decompression microdiscectomy (N/A, 05/24/2020).  Family History: family history includes Arthritis in an other family member; Hyperlipidemia in an other family member; Hypertension in an other family member; Lung cancer in his mother; Prostate cancer in an other family member.  Social History:  reports that he has never smoked. He has never been exposed to tobacco smoke. He has never used smokeless tobacco. He reports current alcohol use. He reports that he does not use drugs.  Current Medications:  Prior to Admission medications   Medication Sig Start Date End Date Taking? Authorizing Provider  albuterol  (VENTOLIN  HFA) 108 (90 Base) MCG/ACT inhaler Inhale 2 puffs into the lungs every 8 (eight) hours as needed for wheezing or shortness of breath.   Yes [provider]  amLODipine  (NORVASC ) 2.5 MG tablet TAKE 1 TABLET BY MOUTH DAILY 03/24/23  Yes Almira Jaeger, MD  aspirin  81 MG chewable tablet Chew 81 mg by mouth daily.    Yes [provider]  atorvastatin  (LIPITOR) 20 MG tablet TAKE 1 TABLET BY MOUTH DAILY Patient taking differently: Take 20 mg by mouth at bedtime. 06/22/22  Yes Almira Jaeger, MD  cyanocobalamin  (VITAMIN B12) 1000 MCG/ML injection Inject 1,000 mcg into the muscle every 30 (thirty) days.   Yes [provider]  divalproex  (DEPAKOTE ) 125 MG DR tablet Take 125 mg by mouth 3 (three) times daily. 08/03/23  Yes [provider]  ferrous sulfate  325 (65 FE) MG tablet Take 325 mg by mouth daily with breakfast.   Yes [provider]  furosemide  (LASIX ) 80 MG tablet Take 1 tablet (80 mg total) by mouth daily. 05/31/23  Yes Laurann Pollock, MD  levothyroxine  (SYNTHROID ) 75 MCG tablet TAKE 1 TABLET BY MOUTH EVERY MORNING 01/13/23  Yes Almira Jaeger, MD  losartan  (COZAAR ) 50 MG tablet TAKE 1 TABLET BY MOUTH DAILY 01/13/23  Yes Almira Jaeger, MD  OLANZapine  (ZYPREXA ) 2.5 MG tablet Take  2.5 mg by mouth  in the morning and at bedtime. 08/20/23  Yes [provider]  omeprazole  (PRILOSEC) 20 MG capsule Take 20 mg by mouth daily. 08/23/23  Yes [provider]  sennosides-docusate sodium  (SENOKOT-S) 8.6-50 MG tablet Take 2 tablets by mouth daily.   Yes [provider]  sertraline  (ZOLOFT ) 100 MG tablet TAKE 2 TABLETS BY MOUTH DAILY 11/25/22  Yes Almira Jaeger, MD  ciprofloxacin  (CILOXAN ) 0.3 % ophthalmic solution Place 2 drops into both eyes every 4 (four) hours while awake. Patient not taking: Reported on 08/24/2023 08/20/23   [provider]  Cyanocobalamin  (B-12 PO) Take 1 tablet by mouth daily. Patient not taking: Reported on 05/31/2023    [provider]  Vitamin D , Ergocalciferol , (DRISDOL ) 1.25 MG (50000 UNIT) CAPS capsule TAKE 1 CAPSULE BY MOUTH EVERY 7  DAYS 08/30/23   Almira Jaeger, MD    Allergies:  Allergies as of 08/23/2023   (No Known Allergies)    ROS:  Pertinent positives and negatives noted in HPI   Objective:     BP (!) 145/46 (BP Location: Right Leg)   Pulse 63   Temp (!) 97.5 F (36.4 C)   Resp 18   Ht 6' (1.829 m)   Wt 97.5 kg   SpO2 100%   BMI 29.16 kg/m    Constitutional :  combative and uncooperative  Respiratory:  Clear to auscultation bilaterally  Cardiovascular:  Regular rate and rhythm  Gastrointestinal: soft, non-tender; bowel sounds normal; no masses,  no organomegaly.   Skin: Cool and moist  Psychiatric: Normal affect, non-agitated, not confused       LABS:     Latest Ref Rng & Units 11/23/2023    5:50 AM 11/19/2023    5:50 PM 11/08/2023   10:51 PM  CMP  Glucose 70 - 99 mg/dL 95  88  952   BUN 8 - 23 mg/dL 34  32  34   Creatinine 0.61 - 1.24 mg/dL 8.41  3.24  4.01   Sodium 135 - 145 mmol/L 135  134  135   Potassium 3.5 - 5.1 mmol/L 3.6  3.2  4.3   Chloride 98 - 111 mmol/L 100  100  100   CO2 22 - 32 mmol/L 25  24  25    Calcium  8.9 - 10.3 mg/dL 9.1  8.4  8.8   Total Protein 6.5  - 8.1 g/dL 6.2  5.2  5.4   Total Bilirubin 0.0 - 1.2 mg/dL 0.8  0.6  0.6   Alkaline Phos 38 - 126 U/L 87  70  92   AST 15 - 41 U/L 44  45  41   ALT 0 - 44 U/L 30  32  28       Latest Ref Rng & Units 11/23/2023    5:50 AM 11/20/2023    4:44 AM 10/30/2023    1:32 AM  CBC  WBC 4.0 - 10.5 K/uL 5.1  5.1  5.7   Hemoglobin 13.0 - 17.0 g/dL 02.7  25.3  66.4   Hematocrit 39.0 - 52.0 % 45.2  34.3  37.2   Platelets 150 - 400 K/uL 100  74  83      RADS: CLINICAL DATA:  Cholelithiasis.   EXAM: ULTRASOUND ABDOMEN LIMITED RIGHT UPPER QUADRANT   COMPARISON:  CT earlier 11/23/2023.   FINDINGS: Gallbladder:   Dilated gallbladder. No wall thickening or reported Murphy's sign. Layering sludge. There are also some dependent small stones.   Common bile duct:  Diameter: 3 mm   Liver:   Grossly preserved hepatic echotexture when adjusted for overlapping bowel gas and soft tissue. Small cysts seen once again along the inferior aspect of the right hepatic lobe which is near anechoic with through transmission. Thin septation. This measures 17 mm. Please correlate with prior CT portal vein is patent on color Doppler imaging with normal direction of blood flow towards the liver.   Other: None.   IMPRESSION: Dilated gallbladder with sludge and some small stones. No further sonographic evidence of acute cholecystitis. If there is further concern a HIDA scan could be considered. No biliary ductal dilatation.     Electronically Signed   By: Adrianna Horde M.D.   On: 11/23/2023 14:23   CLINICAL DATA:  Abdominal pain acute nonlocalized   EXAM: CT ABDOMEN AND PELVIS WITHOUT CONTRAST   TECHNIQUE: Multidetector CT imaging of the abdomen and pelvis was performed following the standard protocol without IV contrast.   RADIATION DOSE REDUCTION: This exam was performed according to the departmental dose-optimization program which includes automated exposure control, adjustment of the mA  and/or kV according to patient size and/or use of iterative reconstruction technique.   COMPARISON:  March 01, 2012   FINDINGS: Lower chest: Extensive coronary artery calcifications   Hepatobiliary: Liver normal size without parenchymal lesions. Gallbladder mildly distended with gallstones without gallbladder inflammatory changes could correlate with chronic cholelithiasis.   Pancreas: Unremarkable. No pancreatic ductal dilatation or surrounding inflammatory changes.   Spleen: Normal in size without focal abnormality.   Adrenals/Urinary Tract: Adrenal glands are unremarkable. Kidneys are normal, without renal calculi, focal lesion, or hydronephrosis. Bladder is unremarkable. Exophytic cyst in the posterior cortical region of the left kidney lower pole 1.4 cm. Enter Bosniak 1   Stomach/Bowel: Stomach is within normal limits. Appendix appears normal. No evidence of bowel wall thickening, distention, or inflammatory changes. No evidence of appendicitis. No evidence of diverticulitis. Moderate amount of residual fecal material throughout colon and in fecal impaction rectum without obstruction   Vascular/Lymphatic: Aortic atherosclerosis. No enlarged abdominal or pelvic lymph nodes.   Reproductive: Prostate is unremarkable.   Other: No abdominal wall hernia or abnormality. No abdominopelvic ascites.   Musculoskeletal: No acute or significant osseous findings.   IMPRESSION: *Moderate amount of residual fecal material throughout colon and in fecal impaction rectum without obstruction. *Cholelithiasis without evidence of acute cholecystitis. *Aortic atherosclerosis.     Electronically Signed   By: Fredrich Jefferson M.D.   On: 11/23/2023 08:50 Assessment:      Gallstones Agitation with advanced dementia  Plan:    Labs and imaging reviewed with no obvious concerns for source of sepsis.  Patient actively trying to remove hand covers, punching and kicking me during the exam,  no sign of lethargy.  Abdomen very soft, with no voluntary or involuntary guarding noted in any quadrants.  Patient facial expression did not denote any signs of tenderness to palpation either.  With CT and ultrasound results as noted above, highly unlikely for there to be any infectious etiology and abdomen.  The reported abdominal pain from hospitalist could potentially be constipation related due to the moderate stool burden.  Recommend stool softeners to try to facilitate bowel movements if possible.  Surgery will reexamine again tomorrow to make sure no changes in physical exam.  Doubt proceeding with HIDA scan would yield any significant benefit due to the lack of lab and physical exam findings at this point.  Also have concerns of patient cooperation through the HIDA  scan testing secondary to his baseline dementia and agitation.  Further care per hospitalist in the meantime. labs/images/medications/previous chart entries reviewed personally and relevant changes/updates noted above.

## 2023-11-24 DIAGNOSIS — F03918 Unspecified dementia, unspecified severity, with other behavioral disturbance: Secondary | ICD-10-CM | POA: Diagnosis not present

## 2023-11-24 LAB — COMPREHENSIVE METABOLIC PANEL WITH GFR
ALT: 26 U/L (ref 0–44)
AST: 35 U/L (ref 15–41)
Albumin: 3 g/dL — ABNORMAL LOW (ref 3.5–5.0)
Alkaline Phosphatase: 69 U/L (ref 38–126)
Anion gap: 6 (ref 5–15)
BUN: 26 mg/dL — ABNORMAL HIGH (ref 8–23)
CO2: 24 mmol/L (ref 22–32)
Calcium: 8.9 mg/dL (ref 8.9–10.3)
Chloride: 111 mmol/L (ref 98–111)
Creatinine, Ser: 1.3 mg/dL — ABNORMAL HIGH (ref 0.61–1.24)
GFR, Estimated: 53 mL/min — ABNORMAL LOW (ref >60)
Glucose, Bld: 89 mg/dL (ref 70–99)
Potassium: 3.9 mmol/L (ref 3.5–5.1)
Sodium: 141 mmol/L (ref 135–145)
Total Bilirubin: 0.9 mg/dL (ref 0.0–1.2)
Total Protein: 5.4 g/dL — ABNORMAL LOW (ref 6.5–8.1)

## 2023-11-24 LAB — CBC
HCT: 39.2 % (ref 39.0–52.0)
Hemoglobin: 13.2 g/dL (ref 13.0–17.0)
MCH: 27.3 pg (ref 26.0–34.0)
MCHC: 33.7 g/dL (ref 30.0–36.0)
MCV: 81 fL (ref 80.0–100.0)
Platelets: 84 10*3/uL — ABNORMAL LOW (ref 150–400)
RBC: 4.84 MIL/uL (ref 4.22–5.81)
RDW: 15.5 % (ref 11.5–15.5)
WBC: 4.4 10*3/uL (ref 4.0–10.5)
nRBC: 0 % (ref 0.0–0.2)

## 2023-11-24 MED ORDER — CLONAZEPAM 0.25 MG PO TBDP
1.0000 mg | ORAL_TABLET | Freq: Two times a day (BID) | ORAL | Status: DC
  Administered 2023-11-24 – 2023-12-01 (×9): 1 mg via ORAL
  Filled 2023-11-24 (×15): qty 4

## 2023-11-24 MED ORDER — SENNOSIDES-DOCUSATE SODIUM 8.6-50 MG PO TABS
1.0000 | ORAL_TABLET | Freq: Two times a day (BID) | ORAL | Status: DC
  Administered 2023-11-26 – 2023-11-30 (×5): 1 via ORAL
  Filled 2023-11-24 (×10): qty 1

## 2023-11-24 MED ORDER — ZIPRASIDONE MESYLATE 20 MG IM SOLR
10.0000 mg | Freq: Four times a day (QID) | INTRAMUSCULAR | Status: DC | PRN
  Administered 2023-11-24 – 2023-11-30 (×8): 10 mg via INTRAMUSCULAR
  Filled 2023-11-24 (×9): qty 20

## 2023-11-24 NOTE — Progress Notes (Signed)
 Subjective:  CC: [name] is a 89 y.o. male  Hospital stay day 1,   gallstones  HPI: No issues reported overnight   Objective:   Temp:  [96.8 F (36 C)-97.7 F (36.5 C)] 97.5 F (36.4 C) (05/14 0823) Pulse Rate:  [59-69] 69 (05/14 0823) Resp:  [15-18] 17 (05/14 0823) BP: (124-165)/(45-89) 133/67 (05/14 0823) SpO2:  [92 %-100 %] 100 % (05/14 0823)     Height: 6' (182.9 cm) Weight: 97.5 kg BMI (Calculated): 29.15   Intake/Output this shift:   Intake/Output Summary (Last 24 hours) at 11/24/2023 0916 Last data filed at 11/24/2023 1610 Gross per 24 hour  Intake 2413.15 ml  Output --  Net 2413.15 ml    Constitutional :  Calm this a.m., sleeping  Respiratory:  Clear to auscultation bilaterally  Cardiovascular:  Regular rate and rhythm  Gastrointestinal: soft, non-tender; bowel sounds normal; no masses,  no organomegaly.   Skin: Cool and moist.  No obvious skin lesions noted.  Backside not examined due to patient irritation when attempting to turn over  Psychiatric: Normal affect, non-agitated, not confused       LABS:     Latest Ref Rng & Units 11/23/2023    5:50 AM 11/19/2023    5:50 PM 11/08/2023   10:51 PM  CMP  Glucose 70 - 99 mg/dL 95  88  960   BUN 8 - 23 mg/dL 34  32  34   Creatinine 0.61 - 1.24 mg/dL 4.54  0.98  1.19   Sodium 135 - 145 mmol/L 135  134  135   Potassium 3.5 - 5.1 mmol/L 3.6  3.2  4.3   Chloride 98 - 111 mmol/L 100  100  100   CO2 22 - 32 mmol/L 25  24  25    Calcium  8.9 - 10.3 mg/dL 9.1  8.4  8.8   Total Protein 6.5 - 8.1 g/dL 6.2  5.2  5.4   Total Bilirubin 0.0 - 1.2 mg/dL 0.8  0.6  0.6   Alkaline Phos 38 - 126 U/L 87  70  92   AST 15 - 41 U/L 44  45  41   ALT 0 - 44 U/L 30  32  28       Latest Ref Rng & Units 11/24/2023    8:06 AM 11/23/2023    5:50 AM 11/20/2023    4:44 AM  CBC  WBC 4.0 - 10.5 K/uL 4.4  5.1  5.1   Hemoglobin 13.0 - 17.0 g/dL 14.7  82.9  56.2   Hematocrit 39.0 - 52.0 % 39.2  45.2  34.3   Platelets 150 - 400 K/uL 84  100   74     RADS: N/a Assessment:   Gallstones.  Abdominal exam remains benign, no change since yesterday.  White count also remains within normal limits this morning.  Again, doubt any abdominal pathology at this point as the cause of his irritation and hypothermia.  Pending CMP.if LFTs remain within normal limits, surgery to sign off at this time.  Please call with any changes in clinical exam such as worsening abdominal tenderness, or changes in labs  labs/images/medications/previous chart entries reviewed personally and relevant changes/updates noted above.

## 2023-11-24 NOTE — Plan of Care (Signed)
  Problem: Health Behavior/Discharge Planning: Goal: Ability to manage health-related needs will improve Outcome: Not Progressing   Problem: Clinical Measurements: Goal: Respiratory complications will improve Outcome: Progressing Goal: Cardiovascular complication will be avoided Outcome: Progressing   Problem: Activity: Goal: Risk for activity intolerance will decrease Outcome: Progressing   Problem: Safety: Goal: Ability to remain free from injury will improve Outcome: Not Progressing

## 2023-11-24 NOTE — Progress Notes (Signed)
 PROGRESS NOTE    Hunter [name]  FAO:[id number] DOB: 1933-12-18 DOA: 08/24/2023 PCP: Almira Jaeger, MD    Brief Narrative:   89 y.o. male with medical history significant of hypertension, chronic HFpEF, hyperlipidemia, complete bundle branch block status post pacemaker, stage III CKD, dementia currently in ED holding.  Requesting admission for hypothermia as well as lethargy.  Per report, patient has been a longstanding boarding patient in the ER for psychiatric issues including end-stage dementia and agitation.  Per report, patient with worsening lethargy over the course of the night.  This is a fairly new finding though patient has significant agitation as well as secondary lethargy after antipsychotic regimen.  Patient noted had a rectal temp around 95-96 on evaluation by nursing.  No reports of cough, shortness of breath.  No reports of chest pain.  No abdominal pain or reported diarrhea.  No reported falls.  Systolic pressures have been fairly stable though with pressures dropping into the 90s overnight.  Noted chronic small sacral pressure ulcer.  Psychiatry noted have been consulted on May 9 for worsening agitation in setting of end-stage dementia.  Medications including Depakote  and Zyprexa  ordered for psychiatric management. Overnight in the ER with a Tmax of 95.5, heart rate 50s to 70s.  Respirations into the 20s.  Blood pressure 90s to 110s over 40s to 50s.  Satting well on room air.  White count 5.1, hemoglobin 14.5, platelets 100, creatinine 1.53.  LFTs and T. bili grossly within normal limits.  Urinalysis not indicative of infection.  Chest x-ray and CT head grossly within normal limits.  CT abdomen pelvis with initial concern for gallbladder pathology however ruled out.  General surgery consultation or requested and appreciated.  No surgical intervention warranted.  General surgery has signed off as of 5/14.  LFTs have normalized.  5/14: Patient remain as agitated, dangerous behavior  towards staff.   Assessment & Plan:   Principal Problem:   Aggressive behavior due to dementia [facility]) Active Problems:   Hypothermia   Heart failure with preserved ejection fraction (HFpEF)    Acquired hypothyroidism   Mixed hyperlipidemia   Essential hypertension   GERD (gastroesophageal reflux disease)   CKD (chronic kidney disease)   Complete heart block (HCC)   Dementia (HCC)   Physically aggressive behavior   Pressure injury of skin  Hypothermia Borderline SIRS Temperature 95.5 today Meeting borderline SIRS criteria with hypothermia as well as respirations at 20 White count within normal limits Procalcitonin negative.  Lactate normal. No clinical evidence of infection Plan: Discontinue antibiotics Monitor vitals and fever curve   Heart failure with preserved ejection fraction (HFpEF)  2D echo May 2024 with EF of 50 to 55% and grade 1 diastolic dysfunction Appears to be near baseline versus trace volume overload on evaluation today Diuretic and antihypertensive regimen restarted    Dementia (HCC) Baseline agitation in setting of dementia Management per psychiatric team   Complete heart block (HCC) Status post pacemaker   CKD (chronic kidney disease) Creatinine 1.5 today with GFR in the 50s Appears near baseline Monitor   GERD (gastroesophageal reflux disease) PPI   Essential hypertension Systolic pressures 90s on presentation with overlapping hypothermia Antihypertensive and diuretic restarted   Mixed hyperlipidemia CK normal.  Continue Lipitor   Acquired hypothyroidism Continue Synthroid  TSH normal   DVT prophylaxis: SQ Lovenox  Code Status: Full Family Communication: None Disposition Plan: Status is: Inpatient Remains inpatient appropriate because: Unsafe discharge plan.  Patient is medically appropriate for discharge at this time.  Pending placement in skilled nursing facility versus long-term care   Level of care: Telemetry  Medical  Consultants:  Psychiatry  Procedures:  None  Antimicrobials: None   Subjective: Seen and examined.  Unable to provide history.  Not agitated at time of my evaluation.  Objective: Vitals:   11/23/23 1522 11/23/23 2001 11/24/23 0349 11/24/23 0823  BP: (!) 165/68 (!) 145/46 (!) 156/89 133/67  Pulse: (!) 59 63 60 69  Resp: 16 18 18 17   Temp: (!) 97.4 F (36.3 C) (!) 97.5 F (36.4 C) (!) 96.8 F (36 C) (!) 97.5 F (36.4 C)  TempSrc:      SpO2: 92% 100% 100% 100%  Weight:      Height:        Intake/Output Summary (Last 24 hours) at 11/24/2023 1331 Last data filed at 11/24/2023 0656 Gross per 24 hour  Intake 1663.15 ml  Output --  Net 1663.15 ml   Filed Weights   08/23/23 2222  Weight: 97.5 kg    Examination:  General exam: Appears chronically ill and disheveled Respiratory system: Overall clear.  Normal work of breathing.  Room air Cardiovascular system: S1-S2, RRR, no murmurs, trace pedal edema Gastrointestinal system: Soft, NT/ND, normal bowel sounds Central nervous system: Awake.  Lethargic.  Unable to assess orientation Extremities: Symmetric 5 x 5 power.  Gait not assessed Skin: No rashes, lesions or ulcers Psychiatry: Unable to assess    Data Reviewed: I have personally reviewed following labs and imaging studies  CBC: Recent Labs  Lab 11/20/23 0444 11/23/23 0550 11/24/23 0806  WBC 5.1 5.1 4.4  NEUTROABS 2.5 1.8  --   HGB 11.4* 14.5 13.2  HCT 34.3* 45.2 39.2  MCV 81.3 84.3 81.0  PLT 74* 100* 84*   Basic Metabolic Panel: Recent Labs  Lab 11/19/23 1750 11/23/23 0550 11/24/23 0806  NA 134* 135 141  K 3.2* 3.6 3.9  CL 100 100 111  CO2 24 25 24   GLUCOSE 88 95 89  BUN 32* 34* 26*  CREATININE 1.69* 1.53* 1.30*  CALCIUM  8.4* 9.1 8.9  MG 2.0  --   --    GFR: Estimated Creatinine Clearance: 48.5 mL/min (A) (by C-G formula based on SCr of 1.3 mg/dL (H)). Liver Function Tests: Recent Labs  Lab 11/19/23 1750 11/23/23 0550  11/24/23 0806  AST 45* 44* 35  ALT 32 30 26  ALKPHOS 70 87 69  BILITOT 0.6 0.8 0.9  PROT 5.2* 6.2* 5.4*  ALBUMIN 3.0* 3.6 3.0*   No results for input(s): "LIPASE", "AMYLASE" in the last 168 hours. Recent Labs  Lab 11/19/23 1730 11/23/23 0911  AMMONIA 18 <13   Coagulation Profile: Recent Labs  Lab 11/23/23 0945  INR 1.1   Cardiac Enzymes: Recent Labs  Lab 11/23/23 0911  CKTOTAL 21*   BNP (last 3 results) No results for input(s): "PROBNP" in the last 8760 hours. HbA1C: No results for input(s): "HGBA1C" in the last 72 hours. CBG: Recent Labs  Lab 11/23/23 0554  GLUCAP 96   Lipid Profile: No results for input(s): "CHOL", "HDL", "LDLCALC", "TRIG", "CHOLHDL", "LDLDIRECT" in the last 72 hours. Thyroid  Function Tests: Recent Labs    11/23/23 0911  TSH 3.200   Anemia Panel: No results for input(s): "VITAMINB12", "FOLATE", "FERRITIN", "TIBC", "IRON", "RETICCTPCT" in the last 72 hours. Sepsis Labs: Recent Labs  Lab 11/23/23 0550 11/23/23 1055  PROCALCITON  --  <0.10  LATICACIDVEN 1.3  --     Recent Results (from the past 240 hours)  Culture, blood (routine x 2)     Status: None (Preliminary result)   Collection Time: 11/23/23  5:50 AM   Specimen: BLOOD  Result Value Ref Range Status   Specimen Description BLOOD RIGHT ARM  Final   Special Requests   Final    BOTTLES DRAWN AEROBIC AND ANAEROBIC Blood Culture results may not be optimal due to an inadequate volume of blood received in culture bottles   Culture   Final    NO GROWTH 1 DAY Performed at [facility], [address], [place], [place] [address]    Report Status PENDING  Incomplete  Culture, blood (routine x 2)     Status: None (Preliminary result)   Collection Time: 11/23/23  5:50 AM   Specimen: BLOOD  Result Value Ref Range Status   Specimen Description BLOOD LEFT ARM  Final   Special Requests   Final    BOTTLES DRAWN AEROBIC AND ANAEROBIC Blood Culture results may not be optimal due  to an inadequate volume of blood received in culture bottles   Culture   Final    NO GROWTH 1 DAY Performed at [facility], [address], [place], [place] [address]    Report Status PENDING  Incomplete  Resp panel by RT-PCR (RSV, Flu A&B, Covid) Anterior Nasal Swab     Status: None   Collection Time: 11/23/23  5:50 AM   Specimen: Anterior Nasal Swab  Result Value Ref Range Status   SARS Coronavirus 2 by RT PCR NEGATIVE NEGATIVE Final    Comment: (NOTE) SARS-CoV-2 target nucleic acids are NOT DETECTED.  The SARS-CoV-2 RNA is generally detectable in upper respiratory specimens during the acute phase of infection. The lowest concentration of SARS-CoV-2 viral copies this assay can detect is 138 copies/mL. A negative result does not preclude SARS-Cov-2 infection and should not be used as the sole basis for treatment or other patient management decisions. A negative result may occur with  improper specimen collection/handling, submission of specimen other than nasopharyngeal swab, presence of viral mutation(s) within the areas targeted by this assay, and inadequate number of viral copies(<138 copies/mL). A negative result must be combined with clinical observations, patient history, and epidemiological information. The expected result is Negative.  Fact Sheet for Patients:  BloggerCourse.com  Fact Sheet for Healthcare Providers:  SeriousBroker.it  This test is no t yet approved or cleared by the [place]  FDA and  has been authorized for detection and/or diagnosis of SARS-CoV-2 by FDA under an Emergency Use Authorization (EUA). This EUA will remain  in effect (meaning this test can be used) for the duration of the COVID-19 declaration under Section 564(b)(1) of the Act, 21 U.S.C.section 360bbb-3(b)(1), unless the authorization is terminated  or revoked sooner.       Influenza A by PCR NEGATIVE NEGATIVE Final    Influenza B by PCR NEGATIVE NEGATIVE Final    Comment: (NOTE) The Xpert Xpress SARS-CoV-2/FLU/RSV plus assay is intended as an aid in the diagnosis of influenza from Nasopharyngeal swab specimens and should not be used as a sole basis for treatment. Nasal washings and aspirates are unacceptable for Xpert Xpress SARS-CoV-2/FLU/RSV testing.  Fact Sheet for Patients: BloggerCourse.com  Fact Sheet for Healthcare Providers: SeriousBroker.it  This test is not yet approved or cleared by the [place]  FDA and has been authorized for detection and/or diagnosis of SARS-CoV-2 by FDA under an Emergency Use Authorization (EUA). This EUA will remain in effect (meaning this test can be used) for the  duration of the COVID-19 declaration under Section 564(b)(1) of the Act, 21 U.S.C. section 360bbb-3(b)(1), unless the authorization is terminated or revoked.     Resp Syncytial Virus by PCR NEGATIVE NEGATIVE Final    Comment: (NOTE) Fact Sheet for Patients: BloggerCourse.com  Fact Sheet for Healthcare Providers: SeriousBroker.it  This test is not yet approved or cleared by the [place]  FDA and has been authorized for detection and/or diagnosis of SARS-CoV-2 by FDA under an Emergency Use Authorization (EUA). This EUA will remain in effect (meaning this test can be used) for the duration of the COVID-19 declaration under Section 564(b)(1) of the Act, 21 U.S.C. section 360bbb-3(b)(1), unless the authorization is terminated or revoked.  Performed at [facility], [address], [place], [place] [address]   Urine Culture     Status: Abnormal (Preliminary result)   Collection Time: 11/23/23  6:50 AM   Specimen: Urine, Random  Result Value Ref Range Status   Specimen Description   Final    URINE, RANDOM Performed at [facility], [address], [place], [place]  [address]    Special Requests   Final    NONE Reflexed from [id number] Performed at [facility], [address] [place], [place] [address]    Culture 80,000 COLONIES/mL STAPHYLOCOCCUS HAEMOLYTICUS (A)  Final   Report Status PENDING  Incomplete  MRSA Next Gen by PCR, Nasal     Status: None   Collection Time: 11/23/23  1:56 PM   Specimen: Nasal Mucosa; Nasal Swab  Result Value Ref Range Status   MRSA by PCR Next Gen NOT DETECTED NOT DETECTED Final    Comment: (NOTE) The GeneXpert MRSA Assay (FDA approved for NASAL specimens only), is one component of a comprehensive MRSA colonization surveillance program. It is not intended to diagnose MRSA infection nor to guide or monitor treatment for MRSA infections. Test performance is not FDA approved in patients less than 24 years old. Performed at [facility], [address] [place], [place] [address]          Radiology Studies: US  Abdomen Limited RUQ (LIVER/GB) Result Date: 11/23/2023 CLINICAL DATA:  Cholelithiasis. EXAM: ULTRASOUND ABDOMEN LIMITED RIGHT UPPER QUADRANT COMPARISON:  CT earlier 11/23/2023. FINDINGS: Gallbladder: Dilated gallbladder. No wall thickening or reported Murphy's sign. Layering sludge. There are also some dependent small stones. Common bile duct: Diameter: 3 mm Liver: Grossly preserved hepatic echotexture when adjusted for overlapping bowel gas and soft tissue. Small cysts seen once again along the inferior aspect of the right hepatic lobe which is near anechoic with through transmission. Thin septation. This measures 17 mm. Please correlate with prior CT portal vein is patent on color Doppler imaging with normal direction of blood flow towards the liver. Other: None. IMPRESSION: Dilated gallbladder with sludge and some small stones. No further sonographic evidence of acute cholecystitis. If there is further concern a HIDA scan could be considered. No biliary ductal dilatation. Electronically Signed   By:  Adrianna Horde M.D.   On: 11/23/2023 14:23   CT ABDOMEN PELVIS WO CONTRAST Result Date: 11/23/2023 CLINICAL DATA:  Abdominal pain acute nonlocalized EXAM: CT ABDOMEN AND PELVIS WITHOUT CONTRAST TECHNIQUE: Multidetector CT imaging of the abdomen and pelvis was performed following the standard protocol without IV contrast. RADIATION DOSE REDUCTION: This exam was performed according to the departmental dose-optimization program which includes automated exposure control, adjustment of the mA and/or kV according to patient size and/or use of iterative reconstruction technique. COMPARISON:  March 01, 2012 FINDINGS:  Lower chest: Extensive coronary artery calcifications Hepatobiliary: Liver normal size without parenchymal lesions. Gallbladder mildly distended with gallstones without gallbladder inflammatory changes could correlate with chronic cholelithiasis. Pancreas: Unremarkable. No pancreatic ductal dilatation or surrounding inflammatory changes. Spleen: Normal in size without focal abnormality. Adrenals/Urinary Tract: Adrenal glands are unremarkable. Kidneys are normal, without renal calculi, focal lesion, or hydronephrosis. Bladder is unremarkable. Exophytic cyst in the posterior cortical region of the left kidney lower pole 1.4 cm. Enter Bosniak 1 Stomach/Bowel: Stomach is within normal limits. Appendix appears normal. No evidence of bowel wall thickening, distention, or inflammatory changes. No evidence of appendicitis. No evidence of diverticulitis. Moderate amount of residual fecal material throughout colon and in fecal impaction rectum without obstruction Vascular/Lymphatic: Aortic atherosclerosis. No enlarged abdominal or pelvic lymph nodes. Reproductive: Prostate is unremarkable. Other: No abdominal wall hernia or abnormality. No abdominopelvic ascites. Musculoskeletal: No acute or significant osseous findings. IMPRESSION: *Moderate amount of residual fecal material throughout colon and in fecal impaction  rectum without obstruction. *Cholelithiasis without evidence of acute cholecystitis. *Aortic atherosclerosis. Electronically Signed   By: Fredrich Jefferson M.D.   On: 11/23/2023 08:50   CT CHEST WO CONTRAST Result Date: 11/23/2023 CLINICAL DATA:  89 year old male with altered mental status. Respiratory illness. EXAM: CT CHEST WITHOUT CONTRAST TECHNIQUE: Multidetector CT imaging of the chest was performed following the standard protocol without IV contrast. RADIATION DOSE REDUCTION: This exam was performed according to the departmental dose-optimization program which includes automated exposure control, adjustment of the mA and/or kV according to patient size and/or use of iterative reconstruction technique. COMPARISON:  Chest CT 01/23/2023. Portable chest radiograph 0607 hours today. CT Abdomen and Pelvis 2013. FINDINGS: Cardiovascular: Chronic left chest cardiac pacemaker. Calcified aortic atherosclerosis. calcified coronary artery atherosclerosis. Mild cardiomegaly appears increased since last year. No pericardial effusion. Vascular patency is not evaluated in the absence of IV contrast. Mediastinum/Nodes: Negative for mediastinal mass or lymphadenopathy. Lungs/Pleura: Lower lung volumes compared to last year. Major airways remain patent. Generalized bronchial wall thickening Cha peers new or increased from last year (such as in both lower lobes on series 3, image 80. Chronic subpleural lung scarring. Superimposed increased but symmetric dependent atelectasis. No consolidation. No pleural effusion. No convincing pulmonary edema. Upper Abdomen: Chronic elevation of the right hemidiaphragm. Mildly nodular liver contour. Small but circumscribed 10 mm low-density area along the inferior right hepatic lobe series 2, image 136. This was apparent on a 2013 CT Abdomen and Pelvis, stable and benign (no follow-up imaging recommended). faint dependent tiny gallstones or sludge series 2, image 137. Negative visible noncontrast  pancreas, adrenal glands, bowel in the upper air abdomen In the upper abdomen. Splenomegaly is partially visible (coronal image 109). Musculoskeletal: Thoracic spine degeneration with bulky endplate osteophytes and occasional chronic thoracic interbody ankylosis. No acute or suspicious osseous lesion. IMPRESSION: 1. Lower lung volumes with atelectasis, chronic subpleural lung scarring. But also evidence of generally increased bronchial wall thickening. Consider Acute Bronchitis. No consolidation or pleural effusion. 2. Partially visible splenomegaly, and partially visible nodular liver contour - constellation suspicious for Cirrhosis with portal venous hypertension. Correlation with LFTs might be valuable. 3. Mild cardiomegaly appears increased since last year. Chronic cardiac pacemaker. Aortic Atherosclerosis (ICD10-I70.0). Electronically Signed   By: Marlise Simpers M.D.   On: 11/23/2023 08:36   CT Head Wo Contrast Result Date: 11/23/2023 CLINICAL DATA:  Mental status changes.  06/06/2023 EXAM: CT HEAD WITHOUT CONTRAST TECHNIQUE: Contiguous axial images were obtained from the base of the skull through the vertex without intravenous  contrast. RADIATION DOSE REDUCTION: This exam was performed according to the departmental dose-optimization program which includes automated exposure control, adjustment of the mA and/or kV according to patient size and/or use of iterative reconstruction technique. COMPARISON:  None Available. FINDINGS: Brain: There is no evidence for acute hemorrhage, hydrocephalus, mass lesion, or abnormal extra-axial fluid collection. No definite CT evidence for acute infarction. Diffuse loss of parenchymal volume is consistent with atrophy. Patchy low attenuation in the deep hemispheric and periventricular white matter is nonspecific, but likely reflects chronic microvascular ischemic demyelination. Vascular: No hyperdense vessel or unexpected calcification. Skull: No evidence for fracture. No worrisome  lytic or sclerotic lesion. Sinuses/Orbits: Paranasal sinuses are clear. Small right mastoid effusion is stable. Other: None. IMPRESSION: 1. No acute intracranial abnormality. 2. Atrophy with chronic small vessel ischemic disease. 3. Stable small right mastoid effusion. Electronically Signed   By: Donnal Fusi M.D.   On: 11/23/2023 07:24   DG Chest Port 1 View Result Date: 11/23/2023 CLINICAL DATA:  Weakness EXAM: PORTABLE CHEST 1 VIEW COMPARISON:  10/30/2023 FINDINGS: Mild cardiomegaly. Left chest multi lead pacer. Unchanged mild, diffuse interstitial opacity and possible small layering pleural effusions. No acute osseous findings. IMPRESSION: Mild cardiomegaly with unchanged mild, diffuse interstitial opacity and possible small layering pleural effusions, most consistent with edema. No focal airspace opacity. Electronically Signed   By: Fredricka Jenny M.D.   On: 11/23/2023 06:52        Scheduled Meds:  amLODipine   2.5 mg Oral Daily   aspirin   81 mg Oral Daily   atorvastatin   20 mg Oral QHS   clonazepam  0.5 mg Oral BID   cyanocobalamin   1,000 mcg Intramuscular Q30 days   enoxaparin  (LOVENOX ) injection  40 mg Subcutaneous Q24H   ferrous sulfate   325 mg Oral Q breakfast   furosemide   80 mg Oral Daily   levothyroxine   75 mcg Oral q morning   losartan   50 mg Oral Daily   pantoprazole   40 mg Oral Daily   sertraline   150 mg Oral Daily   Continuous Infusions:   LOS: 1 day     Tiajuana Fluke, MD [facility]   If 7PM-7AM, please contact night-coverage  11/24/2023, 1:31 PM

## 2023-11-24 NOTE — Progress Notes (Signed)
 Occupational Therapy Treatment Patient Details Name: Hunter [name] MRN: [id number] DOB: 1933-10-31 Today's Date: 11/24/2023   History of present illness Pt is an 89 y/o M admitted on 08/24/23 under IVC for aggression with staff & behavioral challenges (per psych, due to baseline dementia) at his facility. PMH: chronic LBP, CKD 3, essential HTN, HLD, L BBB, major depression, neuropathy, PVD.  Course complicated by hypothermia; transferred to med-surg floor (11/23/23) for management of SIRS, unknown etiology.   OT comments  Pt seen for OT/PT co-tx this date, goals & POC updated to reflect continued hospitalization. Pt received sitting EOB with PT upon arrival, keeps eyes closed majority of session and opens briefly with max encouragement when spoken to from L side (pt very HOH, better from L ear). Requires MAX A +2 to perform step pivot from bed to recliner using RW; max cuing to stand upright and extend knees/trunk throughout. Pt participates in grooming/feeding tasks seated in recliner, poor alertness and requires MAX A to load spoon and bring to mouth; hand over hand unsuccessful as pt cannot motor plan/comprehend grasp pattern. Anticipate pt will continue to require MAX A - TOTAL A for all BADLs, will need staff assist for self-feeding at mealtimes. Updated RN, NT and charge RN on pt status and preferences for beverages/foods/+2 assist for mobility with potential use of stedy depending on pt's mentation.       If plan is discharge home, recommend the following:  Supervision due to cognitive status;Assist for transportation;Assistance with cooking/housework;Help with stairs or ramp for entrance;Two people to help with walking and/or transfers;Direct supervision/assist for financial management;Direct supervision/assist for medications management;Two people to help with bathing/dressing/bathroom   Equipment Recommendations  Other (comment)       Precautions / Restrictions Precautions Precautions:  Fall Recall of Precautions/Restrictions: Impaired Precaution/Restrictions Comments: hx of restlessness, agitation and aggression. however cooperative, & pleasant with therapy Restrictions Weight Bearing Restrictions Per Provider Order: No       Mobility Bed Mobility Overal bed mobility: Needs Assistance             General bed mobility comments: Pt recieved sitting EOB with PT    Transfers Overall transfer level: Needs assistance Equipment used: Rolling walker (2 wheels) Transfers: Sit to/from Stand Sit to Stand: +2 physical assistance, From elevated surface, Total assist, Max assist     Step pivot transfers: Max assist, +2 physical assistance, +2 safety/equipment           Balance Overall balance assessment: Needs assistance Sitting-balance support: Feet supported, Bilateral upper extremity supported Sitting balance-Leahy Scale: Fair     Standing balance support: Reliant on assistive device for balance, During functional activity, Bilateral upper extremity supported Standing balance-Leahy Scale: Zero                             ADL either performed or assessed with clinical judgement   ADL Overall ADL's : Needs assistance/impaired Eating/Feeding: Sitting;Total assistance;Maximal assistance Eating/Feeding Details (indicate cue type and reason): pt unable to feed self this date, requires assist to hold food or drink containers. OT attempts hand over hand to load spoon with applesauce or ice cream, pt unable to motor plan/comprehend task due to lethargy (keeps eyes closed throughout). OT loads spoon, and brings to mouth. Pt verbalizing "this is so good" when tasting ice cream and opens mouth for more Grooming: Total assistance;Sitting Grooming Details (indicate cue type and reason): sitting in recliner, pt unable to perform self-care  tasks this date                             Functional mobility during ADLs: Rolling walker (2 wheels);Cueing  for sequencing;Cueing for safety;+2 for safety/equipment;+2 for physical assistance;Maximal assistance;Total assistance General ADL Comments: Will require MAX - TOTAL A for ADLs this date.    Extremity/Trunk Assessment Upper Extremity Assessment Upper Extremity Assessment: Generalized weakness;Right hand dominant   Lower Extremity Assessment Lower Extremity Assessment: Generalized weakness                 Communication Communication Communication: Impaired Factors Affecting Communication: Hearing impaired (L ear better)   Cognition Arousal: Lethargic Behavior During Therapy: Flat affect Cognition: History of cognitive impairments, Cognition impaired   Orientation impairments: Place, Time, Situation Awareness: Intellectual awareness impaired Memory impairment (select all impairments): Short-term memory, Working [employer] fast streamer, Non-declarative long-term memory, Geneticist, molecular long-term memory Attention impairment (select first level of impairment): Focused attention Executive functioning impairment (select all impairments): Initiation, Organization, Sequencing, Reasoning, Problem solving OT - Cognition Comments: increased lethargy and decreased responses this date; pt keeping eyes closed throughout most of session, cooperative and redirectable.                 Following commands: Impaired Following commands impaired: Follows one step commands inconsistently      Cueing   Cueing Techniques: Verbal cues, Tactile cues        General Comments RN and NT updated on pt's status, preferences, assist level, and need for feeder. Skin bandage noted to be tight around LUE.    Pertinent Vitals/ Pain       Pain Assessment Breathing: normal Negative Vocalization: none Facial Expression: smiling or inexpressive Body Language: relaxed Consolability: no need to console PAINAD Score: 0  Home Living Family/patient expects to be discharged to:: Other (Comment)                                             Frequency  Min 2X/week        Progress Toward Goals  OT Goals(current goals can now be found in the care plan section)  Progress towards OT goals: Goals updated  Acute Rehab OT Goals OT Goal Formulation: Patient unable to participate in goal setting Time For Goal Achievement: 12/16/23 Potential to Achieve Goals: Poor ADL Goals Pt Will Perform Grooming: sitting;with contact guard assist Pt Will Perform Upper Body Bathing: sitting;with contact guard assist Pt Will Perform Lower Body Bathing: with mod assist;bed level Pt Will Perform Upper Body Dressing: with min assist;sitting Pt Will Transfer to Toilet: with +2 assist;squat pivot transfer;with mod assist Pt Will Perform Toileting - Clothing Manipulation and hygiene: with max assist;sitting/lateral leans;bed level  Plan      Co-evaluation    PT/OT/SLP Co-Evaluation/Treatment: Yes Reason for Co-Treatment: To address functional/ADL transfers PT goals addressed during session: Mobility/safety with mobility OT goals addressed during session: ADL's and self-care      AM-PAC OT "6 Clicks" Daily Activity     Outcome Measure   Help from another person eating meals?: Total Help from another person taking care of personal grooming?: Total Help from another person toileting, which includes using toliet, bedpan, or urinal?: Total Help from another person bathing (including washing, rinsing, drying)?: Total Help from another person to put on and taking off regular upper body clothing?: Total  Help from another person to put on and taking off regular lower body clothing?: Total 6 Click Score: 6    End of Session Equipment Utilized During Treatment: Gait belt;Rolling walker (2 wheels)  OT Visit Diagnosis: Unsteadiness on feet (R26.81);Repeated falls (R29.6);Muscle weakness (generalized) (M62.81)   Activity Tolerance Patient limited by lethargy   Patient Left in chair;with call bell/phone within  reach;with bed alarm set   Nurse Communication Mobility status        Time: 1610-9604 OT Time Calculation (min): 42 min  Charges: OT General Charges $OT Visit: 1 Visit OT Treatments $Self Care/Home Management : 8-22 mins Orland Visconti L. Chee Dimon, OTR/L  11/24/23, 3:38 PM

## 2023-11-24 NOTE — Progress Notes (Signed)
 Physical Therapy Treatment Patient Details Name: Hunter [name] MRN: [id number] DOB: 1934-06-13 Today's Date: 11/24/2023   History of Present Illness Pt is an 89 y/o M admitted on 08/24/23 under IVC for aggression with staff & behavioral challenges (per psych, due to baseline dementia) at his facility. PMH: chronic LBP, CKD 3, essential HTN, HLD, L BBB, major depression, neuropathy, PVD.  Course complicated by hypothermia; transferred to med-surg floor (11/23/23) for management of SIRS, unknown etiology.    PT Comments  Patient more alert, less restless this AM than during previous session.  Does maintain eyes closed majority of session; opens briefly with max cuing.  More verbally interactive and responsive, but still tangential in speech. Participates with transition to unsupported sitting, maintaining static sitting with close supervision this date.  Transitions bed/chair with RW, max assist +2; poor balance, poor postural extension and functional LE strength persist. Patient with OT end of session to complete light grooming tasks once in chair; OT to connect alarm and reapply safety mitts end of session as appropriate.      If plan is discharge home, recommend the following: Assistance with cooking/housework;Direct supervision/assist for medications management;Assist for transportation;Help with stairs or ramp for entrance;Supervision due to cognitive status;Two people to help with walking and/or transfers;Two people to help with bathing/dressing/bathroom   Can travel by private vehicle     No  Equipment Recommendations  Rolling walker (2 wheels);Wheelchair (measurements PT);Wheelchair cushion (measurements PT);BSC/3in1;Hospital bed    Recommendations for Other Services       Precautions / Restrictions Precautions Precautions: Fall Recall of Precautions/Restrictions: Impaired Precaution/Restrictions Comments: hx of restlessness, agitation and aggression. however cooperative, & pleasant  with therapy Restrictions Weight Bearing Restrictions Per Provider Order: No     Mobility  Bed Mobility Overal bed mobility: Needs Assistance Bed Mobility: Supine to Sit     Supine to sit: Mod assist, Max assist          Transfers Overall transfer level: Needs assistance Equipment used: Rolling walker (2 wheels) Transfers: Sit to/from Stand, Bed to chair/wheelchair/BSC Sit to Stand: Max assist, +2 physical assistance Stand pivot transfers: Max assist, +2 physical assistance         General transfer comment: bilat UEs on RW for optimal comprehension of planned task; extensive assist for lift off, standing balance, walker management.  Excessive posterior trunk lean, triple flexed posture (with limited ability to correct, respond to cuing)    Ambulation/Gait               General Gait Details: unsafe/unable   Stairs             Wheelchair Mobility     Tilt Bed    Modified Rankin (Stroke Patients Only)       Balance Overall balance assessment: Needs assistance Sitting-balance support: No upper extremity supported, Feet supported Sitting balance-Leahy Scale: Fair Sitting balance - Comments: close sup for unsupported sitting balance edge of bed   Standing balance support: Bilateral upper extremity supported Standing balance-Leahy Scale: Zero Standing balance comment: cannot rise fully upright, maxA +2 for partial stand                            Communication Communication Communication: Impaired Factors Affecting Communication: Hearing impaired (L ear better)  Cognition Arousal: Lethargic Behavior During Therapy: Flat affect, Restless   PT - Cognitive impairments: History of cognitive impairments   Orientation impairments: Place, Time, Situation  PT - Cognition Comments: Calm and cooperative throughout session; generally tangential in speech; limited ability to follow verbal commands without hand-over-hand  or gestures from therapist   Following commands impaired: Follows one step commands inconsistently    Cueing    Exercises Other Exercises Other Exercises: Unsupported sitting edge of bed, close sup; dep assist for PO intake while edge of bed.  Maintains eyes closed majority of time, but does open intermittently with max cuing    General Comments General comments (skin integrity, edema, etc.): RN and NT updated on pt's status, preferences, assist level, and need for feeder. Skin bandage noted to be tight around LUE.      Pertinent Vitals/Pain Pain Assessment Pain Assessment: PAINAD Breathing: normal Negative Vocalization: none Facial Expression: smiling or inexpressive Body Language: relaxed Consolability: no need to console PAINAD Score: 0    Home Living Family/patient expects to be discharged to:: Other (Comment)                        Prior Function            PT Goals (current goals can now be found in the care plan section) Acute Rehab PT Goals Patient Stated Goal: none stated PT Goal Formulation: With patient Time For Goal Achievement: 12/23/23 Potential to Achieve Goals: Fair Progress towards PT goals: Progressing toward goals    Frequency    Min 1X/week      PT Plan      Co-evaluation PT/OT/SLP Co-Evaluation/Treatment: Yes Reason for Co-Treatment: To address functional/ADL transfers PT goals addressed during session: Mobility/safety with mobility OT goals addressed during session: ADL's and self-care      AM-PAC PT "6 Clicks" Mobility   Outcome Measure  Help needed turning from your back to your side while in a flat bed without using bedrails?: A Lot Help needed moving from lying on your back to sitting on the side of a flat bed without using bedrails?: A Lot Help needed moving to and from a bed to a chair (including a wheelchair)?: A Lot Help needed standing up from a chair using your arms (e.g., wheelchair or bedside chair)?: A  Lot Help needed to walk in hospital room?: A Lot Help needed climbing 3-5 steps with a railing? : Total 6 Click Score: 11    End of Session Equipment Utilized During Treatment: Gait belt Activity Tolerance: Patient limited by lethargy Patient left: with call bell/phone within reach;in chair (OT at bedside to complete session; to active chair alarm once done) Nurse Communication: Mobility status PT Visit Diagnosis: Unsteadiness on feet (R26.81);Muscle weakness (generalized) (M62.81);Other abnormalities of gait and mobility (R26.89);Difficulty in walking, not elsewhere classified (R26.2);History of falling (Z91.81)     Time: 1191-4782 PT Time Calculation (min) (ACUTE ONLY): 25 min  Charges:    $Therapeutic Activity: 23-37 mins PT General Charges $$ ACUTE PT VISIT: 1 Visit                     Ayham Word H. Bevin Bucks, PT, DPT, NCS 11/24/23, 3:53 PM [phone]

## 2023-11-25 ENCOUNTER — Encounter: Payer: Self-pay | Admitting: Family Medicine

## 2023-11-25 DIAGNOSIS — F03918 Unspecified dementia, unspecified severity, with other behavioral disturbance: Secondary | ICD-10-CM | POA: Diagnosis not present

## 2023-11-25 DIAGNOSIS — F03911 Unspecified dementia, unspecified severity, with agitation: Secondary | ICD-10-CM | POA: Diagnosis not present

## 2023-11-25 LAB — URINE CULTURE: Culture: 80000 — AB

## 2023-11-25 MED ORDER — POLYETHYLENE GLYCOL 3350 17 G PO PACK
17.0000 g | PACK | Freq: Every day | ORAL | Status: DC
  Administered 2023-11-27: 17 g via ORAL
  Filled 2023-11-25 (×5): qty 1

## 2023-11-25 MED ORDER — BISACODYL 10 MG RE SUPP
10.0000 mg | Freq: Every day | RECTAL | Status: DC | PRN
  Administered 2023-11-25: 10 mg via RECTAL
  Filled 2023-11-25: qty 1

## 2023-11-25 NOTE — TOC Progression Note (Signed)
 Transition of Care [facility]) - Progression Note    Patient Details  Name: Hunter [name] MRN: [id number] Date of Birth: [birth year]  [facility]) CM/SW Contact  [name], RN Phone Number: 11/25/2023, 4:53 PM  Clinical Narrative:    Nadia Aurora at [facility] and had to leave a message requesting a call back.  Tried calling [facility] and was no answer also, left a message requesting a call back. TOC to continue to follow   Expected Discharge Plan:  (TBD) Barriers to Discharge:  (finding an accepting facility)  Expected Discharge Plan and Services     Post Acute Care Choice:  (TBD) Living arrangements for the past 2 months: Skilled Nursing Facility                                       Social Determinants of Health (SDOH) Interventions SDOH Screenings   Food Insecurity: Patient Unable To Answer (11/23/2023)  Housing: Patient Unable To Answer (11/23/2023)  Transportation Needs: Patient Unable To Answer (11/23/2023)  Utilities: Low Risk  (06/13/2023)   Received from [facility]  Alcohol Screen: Low Risk  (07/06/2021)  Depression (PHQ2-9): Medium Risk (11/26/2022)  Financial Resource Strain: Low Risk  (06/13/2023)   Received from [facility]  Physical Activity: Insufficiently Active (06/13/2023)   Received from [facility]  Social Connections: Patient Unable To Answer (11/23/2023)  Stress: No Stress Concern Present (06/13/2023)   Received from [facility]  Tobacco Use: Low Risk  (11/25/2023)  Health Literacy: Medium Risk (06/13/2023)   Received from [facility]    Readmission Risk Interventions     No data to display

## 2023-11-25 NOTE — Plan of Care (Signed)
  Problem: Clinical Measurements: Goal: Ability to maintain clinical measurements within normal limits will improve Outcome: Progressing Goal: Diagnostic test results will improve Outcome: Progressing   Problem: Nutrition: Goal: Adequate nutrition will be maintained Outcome: Progressing   Problem: Coping: Goal: Level of anxiety will decrease Outcome: Progressing   

## 2023-11-25 NOTE — Plan of Care (Signed)
   Problem: Education: Goal: Knowledge of General Education information will improve Description: Including pain rating scale, medication(s)/side effects and non-pharmacologic comfort measures Outcome: Not Progressing   Problem: Health Behavior/Discharge Planning: Goal: Ability to manage health-related needs will improve Outcome: Not Progressing

## 2023-11-25 NOTE — Consult Note (Signed)
 Hunter [name]  Patient Name: .[name]  MRN: [id number]  DOB: 04/09/1934  Consult Order details:  Orders (From admission, onward)     Start     Ordered   11/19/23 1501  IP CONSULT TO PSYCHIATRY       Ordering Provider: Lind Repine, MD  Provider:  (Not yet assigned)  Question Answer Comment  Place call to: Psych NP   Reason for Consult Consult      11/19/23 1500   10/15/23 1359  IP CONSULT TO PSYCHIATRY       Ordering Provider: Viviano Ground, MD  Provider:  (Not yet assigned)  Question Answer Comment  Consult Timeframe URGENT - requires response within 12 hours   URGENT timeframe requires provider to provider communication, has the provider to provider communication been completed Yes   Reason for Consult? Consult for medication management   Contact phone number where the requesting provider can be reached [phone]      10/15/23 1358   10/09/23 0238  IP CONSULT TO PSYCHIATRY       Ordering Provider: Dewitt Forehand, DO  Provider:  (Not yet assigned)  Question Answer Comment  Consult Timeframe URGENT - requires response within 12 hours   URGENT timeframe requires provider to provider communication, has the provider to provider communication been completed Yes   Reason for Consult? Consult for medication management due to increasing agitation, not improving withy last medication adjustments 10/04/23   Contact phone number where the requesting provider can be reached [phone]      10/09/23 0238   10/08/23 1128  IP CONSULT TO PSYCHIATRY       Comments: Pt depakote  level being checked and psych needs to review/manage meds as agitation has continued.  Ordering Provider: Shane Darling, MD  Provider:  (Not yet assigned)  Question Answer Comment  Place call to: psych   Reason for Consult Other (See Comments) Medication mgmt  Diagnosis/Clinical Info for Consult: continued aggressive behavior      10/08/23 1132   08/24/23 0045  IP CONSULT TO  PSYCHIATRY       Ordering Provider: Norlene Beavers, MD  Provider:  (Not yet assigned)  Question Answer Comment  Place call to: psychiatry   Reason for Consult Consult   Diagnosis/Clinical Info for Consult: aggressive behavior      08/24/23 0044             Mode of Visit: In person    Psychiatry Consult Evaluation  Service Date: Nov 25, 2023 LOS:  LOS: 2 days  Chief Complaint   Chart review: Hunter [name] is a 89 y.o. male admitted: Medicallyfor 08/24/2023 12:14 AM with hx of dementia brought in to ED for agitation form a facility. Patient has been in ED waiting for placement. Psychiatry is requested to get involved again as patient's agitation has been getting worse.   11/21/23: patient is responding well to titration of Depakote , zyprexa  and klonopin. Will continue to monitor the behaviors.   Sensory Deprivation: Legal guardian wants to bring patient's hearing aids and this provider recommended to bring in hearing aids as sensory deprivation can make confusion worse. EKG 11/19/23- QTC 539- discontinued PRN zyprexa  and increased ativan  2 mg Q6 hr PRN.   Discussed extensively with the legal guardian about the risk benefits of the current treatment in the context of patient's uncontrollable agitation and QTc prolongation and EKG.  Legal guardian expressed her understanding and gave consent for the medication adjustments.  Per nursing today: Patient has continued to be combative and resistant to care. Ativan /klonopin not helping. He is receiving Geodon  10 mg Q 6hrs.   Upon this encounter, patient is seen in bed awake. He appears restless, anxious. He is not combative and allows nurses to complete bath and bed change. He is screaming for help. He appears to have decreased energy comparing to previous encounter.   Patient's nurse reports he was combative yesterday, spitting at staff, biting, which required administration of Geodon  for  safety. Patient has not been eating. "It required 5  staff members to hold him.      Primary Psychiatric Diagnoses  Aggressive behaviors due to Dementia      Assessment  Hunter [name] is a 89 y.o. male admitted: Medicallyfor 08/24/2023 12:14 AM. He carries the neuropsychiatric diagnoses of Dementia and has a past medical history of  (see chart).   His current presentation of agitations, aggression  is most consistent with his past and present psychiatric hx.    Current outpatient psychotropic medications include  Geodon , Ativan , Klonopin and Zoloft  and historically    he is able to get some rest with the above medications.   Upon follow up encounter,  examination, patient continues to experience frustrations, agitations and resistance to care.  Please see plan below for detailed recommendations.   Diagnoses:  Active Hospital problems: Principal Problem:   Aggressive behavior due to dementia [facility]) Active Problems:   Acquired hypothyroidism   Mixed hyperlipidemia   Essential hypertension   GERD (gastroesophageal reflux disease)   CKD (chronic kidney disease)   Heart failure with preserved ejection fraction (HFpEF)    Complete heart block (HCC)   Dementia (HCC)   Physically aggressive behavior   Hypothermia   Pressure injury of skin    Plan   ## Psychiatric Medication Recommendations:  Continue current medications  ## Medical Decision Making Capacity: Not specifically addressed in this encounter  ## Further Work-up:   While pt on Qtc prolonging medications, please monitor & replete K+ to 4 and Mg2+ to 2 -- most recent EKG  -- Pertinent labwork reviewed earlier this admission includes: All labs reviewed   ## Disposition:-- Plan Post Discharge/Psychiatric Care [name] resources to be determined  ## Behavioral / Environmental: - No specific recommendations at this time.     ## Safety and Observation Level:  - Based on my clinical evaluation, I estimate the patient to be at low risk of self harm in the current setting. -  At this time, we recommend  routine. This decision is based on my review of the chart including patient's history and current presentation, interview of the patient, mental status examination, and consideration of suicide risk including evaluating suicidal ideation, plan, intent, suicidal or self-harm behaviors, risk factors, and protective factors. This judgment is based on our ability to directly address suicide risk, implement suicide prevention strategies, and develop a safety plan while the patient is in the clinical setting. Please contact our team if there is a concern that risk level has changed.  CSSR Risk Category:C-SSRS RISK CATEGORY: No Risk  Suicide Risk Assessment: Patient has following modifiable risk factors for suicide: recklessness, which we are addressing by continue to monitor for safety and continue current treatment . Patient has following non-modifiable or demographic risk factors for suicide: male gender Patient has the following protective factors against suicide: no history of suicide attempts  Thank you for this consult request. Recommendations have been communicated to the primary team.  We  will recommend to continue current treatment and to reinforce safety precautions.  at this time.   Elston Halsted, NP       History of Present Illness  Relevant Aspects of [facility] Course:  Admitted on 08/24/2023 for aggressive behaviors.   Patient Report:  "Help"  Psych ROS:  Depression: noted Anxiety:  Noted Mania (lifetime and current): NA Psychosis: (lifetime and current): NA  Collateral information:  Contacted: Attempted to contact guardian Adra Hope [phone] but no response. Voicemail delivered.   Review of Systems  Unable to perform ROS: Dementia  Gastrointestinal: Negative.   Genitourinary: Negative.   Musculoskeletal: Negative.   Neurological: Negative.   Endo/Heme/Allergies: Negative.   Psychiatric/Behavioral:  The patient is  nervous/anxious.      Psychiatric and Social History  Psychiatric History:  Information collected from Chart/nursing  Prev Dx/Sx: Dementia Current Psych Provider: NA Home Meds (current): NA Previous Med Trials: NA Therapy: NA  Prior Psych Hospitalization: Reported  Prior Self Harm: NA Prior Violence: Reported  Family Psych History: NA Family Hx suicide: NA  Social History:  Developmental Hx: NA Educational Hx: NA Occupational Hx: NA Legal Hx: NA Living Situation: NA Spiritual Hx: NA Access to weapons/lethal means: NA   Substance History Alcohol: NA  Type of alcohol NA Last Drink NA Number of drinks per day NA History of alcohol withdrawal seizures NA History of DT's NA Tobacco: NA Illicit drugs: NA Prescription drug abuse: NA Rehab hx: NA  Exam Findings  Physical Exam:  Vital Signs:  Temp:  [96.6 F (35.9 C)-97.8 F (36.6 C)] 97.4 F (36.3 C) (05/15 0336) Pulse Rate:  [56-90] 56 (05/15 0815) Resp:  [15-19] 16 (05/15 0815) BP: (107-123)/(48-77) 118/55 (05/15 0815) SpO2:  [96 %-100 %] 96 % (05/15 0815) Blood pressure (!) 118/55, pulse (!) 56, temperature (!) 97.4 F (36.3 C), temperature source Axillary, resp. rate 16, height 6' (1.829 m), weight 97.5 kg, SpO2 96%. Body mass index is 29.16 kg/m.  Physical Exam Vitals and nursing note reviewed.  Constitutional:      Appearance: He is ill-appearing.  HENT:     Head: Normocephalic and atraumatic.     Left Ear: Tympanic membrane normal.     Nose: Nose normal.     Mouth/Throat:     Mouth: Mucous membranes are moist.  Eyes:     Extraocular Movements: Extraocular movements intact.     Pupils: Pupils are equal, round, and reactive to light.  Musculoskeletal:        General: Normal range of motion.  Neurological:     General: No focal deficit present.     Mental Status: He is alert.     Mental Status Exam: General Appearance: Casual and in hospital gown  Orientation:  Other:  partial  Memory:   Immediate;   Poor Recent;   Poor Remote;   Poor  Concentration:  Concentration: Poor and Attention Span: Poor  Recall:  Poor  Attention  Poor  Eye Contact:  Poor  Speech:  Pressured  Language:  Fair  Volume:  Increased  Mood: Anxious  Affect:  Depressed and Restricted  Thought Process:  Irrelevant  Thought Content:  UTA  Suicidal Thoughts:  No  Homicidal Thoughts:  No  Judgement:  Poor  Insight:  Lacking  Psychomotor Activity:  Restlessness  Akathisia:  NA  Fund of Knowledge:  UTA      Assets:  Desire for Improvement Social Support  Cognition:  Impaired,  Severe  ADL's:  Impaired  AIMS (  if indicated):        Other History   These have been pulled in through the EMR, reviewed, and updated if appropriate.  Family History:  The patient's family history includes Arthritis in an other family member; Hyperlipidemia in an other family member; Hypertension in an other family member; Lung cancer in his mother; Prostate cancer in an other family member.  Medical History: Past Medical History:  Diagnosis Date   Allergic rhinitis 02/02/2007   Anemia    Arthritis    "knees" (02/08/2017)   Chronic lower back pain    CKD (chronic kidney disease), stage III (HCC) 05/27/2011   Complete heart block 02/08/2017   Eczema    Essential hypertension 02/02/2007   Lasix  60 mg, losartan  50mg ,  Amlodipine  5mg --> 2.5 mg.  Usually have to repeat BP measurements  In the past-Maxzide  37.5-25mg .   GERD (gastroesophageal reflux disease) occasional   Hearing loss of both ears    Heart failure with preserved ejection fraction (HFpEF) 12/06/2013   History of skin cancer 03/26/2014   nose    Hx of echocardiogram    Echo (07/2013): Mild LVH, EF 55-60%, grade 1 diastolic dysfunction, mild LAE, PASP 23   Hyperlipidemia    Hypertension    Hypothyroidism    LBBB (left bundle branch block)    Left patella fracture 2018   "no OR" (02/08/2017)   Major depression in partial remission 02/02/2007    Amitriptyline  25mg  for sleep (stop when runs out of #10 04/2018(, zoloft  100-->150mg --> 200mg    Mild neurocognitive disorder 06/21/2019   Neuropathy (HCC) 11/02/2011   Nocturia    Peripheral vascular disease (HCC) LOWER EXTREMITIES   Spinal stenosis in cervical region 03/21/2010   Squamous cell skin cancer, penis: glans (HCC) 05/2010   Initial excision 11/11; recurrence, excision and laser Rx 9/13   Thrombocytopenia (HCC) 05/23/2012   Urinary frequency 09/05/2009   Possible BPH- see Trudi Fus notes    Vitamin D  deficiency 08/15/2008    Surgical History: Past Surgical History:  Procedure Laterality Date   CIRCUMCISION/ LASER DISSECTION PENILE GLANS CANCER  06-02-2010   CYSTOSCOPY WITH URETHRAL DILATATION  03/28/2012   Procedure: CYSTOSCOPY WITH URETHRAL DILATATION;  Surgeon: Edmund Gouge, MD;  Location: [facility] [place];  Service: Urology;  Laterality: N/A;  excision biopsy extensive meatal penile carcinoma meatoplasty   EXCISION RIGHT WRIST BENIGN TUMOR  2002 (APPROX)   hip surgery     INCISION AND DRAINAGE DEEP NECK ABSCESS     INGUINAL HERNIA REPAIR  1970's   KNEE ARTHROSCOPY Right 2017   LUMBAR LAMINECTOMY/DECOMPRESSION MICRODISCECTOMY N/A 05/24/2020   Procedure: OPEN LAMINECTOMY LUMBAR THREE-LUMBAR FOUR;  Surgeon: Pincus Bridgeman, DO;  Location: [facility] OR;  Service: Neurosurgery;  Laterality: N/A;   PACEMAKER IMPLANT N/A 02/08/2017   Procedure: Pacemaker Implant;  Surgeon: Verona Goodwill, MD;  Location: [facility] INVASIVE CV LAB;  Service: Cardiovascular;  Laterality: N/A;   PACEMAKER IMPLANT  02/08/2017   SJM Assurity MRI dual chamber PPM implanted by Dr Rodolfo Clan for complete heart block   PENILE BX  05-09-2010   TONSILLECTOMY     TRANSURETHRAL RESECTION OF PROSTATE N/A 05/10/2015   Procedure: CYSTOSCOPY, URETHRAL MEATAL DILATION, TRANSURETHRAL RESECTION OF THE PROSTATE (TURP);  Surgeon: Annamarie Kid, MD;  Location: [facility];  Service: Urology;  Laterality: N/A;     Medications:    Current Facility-Administered Medications:    acetaminophen  (TYLENOL ) tablet 650 mg, 650 mg, Oral, Q6H PRN, Corrinne Din, MD   albuterol  (PROVENTIL ) (  2.5 MG/3ML) 0.083% nebulizer solution 2.5 mg, 2.5 mg, Inhalation, Q8H PRN, Buell Carmin, MD   amLODipine  (NORVASC ) tablet 2.5 mg, 2.5 mg, Oral, Daily, Buell Carmin, MD, 2.5 mg at 11/24/23 0160   aspirin  chewable tablet 81 mg, 81 mg, Oral, Daily, Buell Carmin, MD, 81 mg at 11/24/23 1093   atorvastatin  (LIPITOR) tablet 20 mg, 20 mg, Oral, QHS, Buell Carmin, MD, 20 mg at 11/22/23 2106   bisacodyl  (DULCOLAX) suppository 10 mg, 10 mg, Rectal, Daily PRN, Mosetta Areola, Sudheer B, MD, 10 mg at 11/25/23 1201   clonazePAM (KLONOPIN) disintegrating tablet 1 mg, 1 mg, Oral, BID, Jadapalle, Sree, MD, 1 mg at 11/24/23 2125   cyanocobalamin  (VITAMIN B12) injection 1,000 mcg, 1,000 mcg, Intramuscular, Q30 days, Buell Carmin, MD, 1,000 mcg at 11/21/23 1018   enoxaparin  (LOVENOX ) injection 40 mg, 40 mg, Subcutaneous, Q24H, Corrinne Din, MD, 40 mg at 11/24/23 2355   ferrous sulfate  tablet 325 mg, 325 mg, Oral, Q breakfast, Buell Carmin, MD, 325 mg at 11/24/23 7322   furosemide  (LASIX ) tablet 80 mg, 80 mg, Oral, Daily, Buell Carmin, MD, 80 mg at 11/24/23 0254   levothyroxine  (SYNTHROID ) tablet 75 mcg, 75 mcg, Oral, q morning, Buell Carmin, MD, 75 mcg at 11/24/23 2706   LORazepam  (ATIVAN ) injection 1 mg, 1 mg, Intravenous, PRN, Shanaye Rief M, NP, 1 mg at 11/25/23 0013   LORazepam  (ATIVAN ) tablet 2 mg, 2 mg, Oral, Q6H PRN, 2 mg at 11/22/23 1652 **OR** LORazepam  (ATIVAN ) injection 2 mg, 2 mg, Intramuscular, Q6H PRN, Jadapalle, Sree, MD, 2 mg at 11/24/23 1223   losartan  (COZAAR ) tablet 50 mg, 50 mg, Oral, Daily, Buell Carmin, MD, 50 mg at 11/24/23 2376   ondansetron  (ZOFRAN ) tablet 4 mg, 4 mg, Oral, Q6H PRN **OR** ondansetron  (ZOFRAN ) injection 4 mg, 4 mg, Intravenous, Q6H PRN, Corrinne Din, MD   pantoprazole  (PROTONIX ) EC tablet 40 mg, 40 mg, Oral, Daily, Buell Carmin, MD, 40 mg at 11/24/23 0906   polyethylene glycol (MIRALAX / GLYCOLAX) packet 17 g, 17 g, Oral, Daily, Sreenath, Sudheer B, MD   senna-docusate (Senokot-S) tablet 1 tablet, 1 tablet, Oral, BID, Sreenath, Sudheer B, MD   sertraline  (ZOLOFT ) tablet 150 mg, 150 mg, Oral, Daily, Lee, Jacqueline Eun, NP, 150 mg at 11/24/23 2831   ziprasidone  (GEODON ) injection 10 mg, 10 mg, Intramuscular, Q6H PRN, Margery Sheets B, MD, 10 mg at 11/25/23 1013  Allergies: No Known Allergies  Elston Halsted, NP

## 2023-11-25 NOTE — Progress Notes (Signed)
 Patient noted to be combative and aggressive with the staff and unable to redirect verbally.  Patient given 10mg  of geodon  at ~1013. Patient resting at this time.

## 2023-11-25 NOTE — Progress Notes (Signed)
 PROGRESS NOTE    Hunter [name]  ZOX:[id number] DOB: 08-01-1933 DOA: 08/24/2023 PCP: Almira Jaeger, MD    Brief Narrative:   89 y.o. male with medical history significant of hypertension, chronic HFpEF, hyperlipidemia, complete bundle branch block status post pacemaker, stage III CKD, dementia currently in ED holding.  Requesting admission for hypothermia as well as lethargy.  Per report, patient has been a longstanding boarding patient in the ER for psychiatric issues including end-stage dementia and agitation.  Per report, patient with worsening lethargy over the course of the night.  This is a fairly new finding though patient has significant agitation as well as secondary lethargy after antipsychotic regimen.  Patient noted had a rectal temp around 95-96 on evaluation by nursing.  No reports of cough, shortness of breath.  No reports of chest pain.  No abdominal pain or reported diarrhea.  No reported falls.  Systolic pressures have been fairly stable though with pressures dropping into the 90s overnight.  Noted chronic small sacral pressure ulcer.  Psychiatry noted have been consulted on May 9 for worsening agitation in setting of end-stage dementia.  Medications including Depakote  and Zyprexa  ordered for psychiatric management. Overnight in the ER with a Tmax of 95.5, heart rate 50s to 70s.  Respirations into the 20s.  Blood pressure 90s to 110s over 40s to 50s.  Satting well on room air.  White count 5.1, hemoglobin 14.5, platelets 100, creatinine 1.53.  LFTs and T. bili grossly within normal limits.  Urinalysis not indicative of infection.  Chest x-ray and CT head grossly within normal limits.  CT abdomen pelvis with initial concern for gallbladder pathology however ruled out.  General surgery consultation or requested and appreciated.  No surgical intervention warranted.  General surgery has signed off as of 5/14.  LFTs have normalized.  5/14: Patient remain as agitated, dangerous behavior  towards staff.    Assessment & Plan:   Principal Problem:   Aggressive behavior due to dementia [facility]) Active Problems:   Hypothermia   Heart failure with preserved ejection fraction (HFpEF)    Acquired hypothyroidism   Mixed hyperlipidemia   Essential hypertension   GERD (gastroesophageal reflux disease)   CKD (chronic kidney disease)   Complete heart block (HCC)   Dementia (HCC)   Physically aggressive behavior   Pressure injury of skin  Hypothermia Borderline SIRS Temperature 95.5 in ED on admission Meeting borderline SIRS criteria with hypothermia as well as respirations at 20 White count within normal limits Procalcitonin negative.  Lactate normal. No clinical evidence of infection No evidence of sepsis.  Ruled out Plan: Continue antibiotics.  Continue monitoring vitals and fever curve.  Low threshold to restart antibiotics should the patient develop any clinical signs of infection.   Heart failure with preserved ejection fraction (HFpEF)  2D echo May 2024 with EF of 50 to 55% and grade 1 diastolic dysfunction Appears euvolemic.  Continue diuretic and antihypertensive regimen    Dementia (HCC) Baseline agitation in setting of dementia Continue as needed Geodon  and Ativan  for agitation   Complete heart block (HCC) Status post pacemaker   CKD (chronic kidney disease) Creatinine 1.5 today with GFR in the 50s Appears near baseline Monitor   GERD (gastroesophageal reflux disease) PPI   Essential hypertension Systolic pressures 90s on presentation with overlapping hypothermia Antihypertensive and diuretic restarted   Mixed hyperlipidemia CK normal.  Continue Lipitor   Acquired hypothyroidism Continue Synthroid  TSH normal   DVT prophylaxis: SQ Lovenox  Code Status: Full Family Communication:  None Disposition Plan: Status is: Inpatient Remains inpatient appropriate because: Unsafe discharge plan.  Patient is medically appropriate for discharge at this  time.  Pending placement in skilled nursing facility versus long-term care   Level of care: Telemetry Medical  Consultants:  Psychiatry  Procedures:  None  Antimicrobials: None   Subjective: Seen and examined.  Unable to provide history.  Not agitated at the time of my evaluation.  Objective: Vitals:   11/24/23 1816 11/24/23 1954 11/25/23 0336 11/25/23 0815  BP: 107/77 (!) 121/55 (!) 123/48 (!) 118/55  Pulse: 90 87 75 (!) 56  Resp: 15 19 18 16   Temp: 97.8 F (36.6 C) (!) 96.6 F (35.9 C) (!) 97.4 F (36.3 C)   TempSrc:   Axillary   SpO2:  100% 98% 96%  Weight:      Height:        Intake/Output Summary (Last 24 hours) at 11/25/2023 1046 Last data filed at 11/25/2023 0347 Gross per 24 hour  Intake --  Output 150 ml  Net -150 ml   Filed Weights   08/23/23 2222  Weight: 97.5 kg    Examination:  General exam: Chronically ill-appearing Respiratory system: Clear.  Normal work of breathing.  Room air Cardiovascular system: S1-S2, RRR, no murmurs, trace pedal edema Gastrointestinal system: Soft, NT/ND, normal bowel sounds Central nervous system: Awake.  Lethargic.  Unable to assess orientation Extremities: Symmetric 5 x 5 power.  Gait not assessed Skin: No rashes, lesions or ulcers Psychiatry: Unable to assess    Data Reviewed: I have personally reviewed following labs and imaging studies  CBC: Recent Labs  Lab 11/20/23 0444 11/23/23 0550 11/24/23 0806  WBC 5.1 5.1 4.4  NEUTROABS 2.5 1.8  --   HGB 11.4* 14.5 13.2  HCT 34.3* 45.2 39.2  MCV 81.3 84.3 81.0  PLT 74* 100* 84*   Basic Metabolic Panel: Recent Labs  Lab 11/19/23 1750 11/23/23 0550 11/24/23 0806  NA 134* 135 141  K 3.2* 3.6 3.9  CL 100 100 111  CO2 24 25 24   GLUCOSE 88 95 89  BUN 32* 34* 26*  CREATININE 1.69* 1.53* 1.30*  CALCIUM  8.4* 9.1 8.9  MG 2.0  --   --    GFR: Estimated Creatinine Clearance: 48.5 mL/min (A) (by C-G formula based on SCr of 1.3 mg/dL (H)). Liver Function  Tests: Recent Labs  Lab 11/19/23 1750 11/23/23 0550 11/24/23 0806  AST 45* 44* 35  ALT 32 30 26  ALKPHOS 70 87 69  BILITOT 0.6 0.8 0.9  PROT 5.2* 6.2* 5.4*  ALBUMIN 3.0* 3.6 3.0*   No results for input(s): "LIPASE", "AMYLASE" in the last 168 hours. Recent Labs  Lab 11/19/23 1730 11/23/23 0911  AMMONIA 18 <13   Coagulation Profile: Recent Labs  Lab 11/23/23 0945  INR 1.1   Cardiac Enzymes: Recent Labs  Lab 11/23/23 0911  CKTOTAL 21*   BNP (last 3 results) No results for input(s): "PROBNP" in the last 8760 hours. HbA1C: No results for input(s): "HGBA1C" in the last 72 hours. CBG: Recent Labs  Lab 11/23/23 0554  GLUCAP 96   Lipid Profile: No results for input(s): "CHOL", "HDL", "LDLCALC", "TRIG", "CHOLHDL", "LDLDIRECT" in the last 72 hours. Thyroid  Function Tests: Recent Labs    11/23/23 0911  TSH 3.200   Anemia Panel: No results for input(s): "VITAMINB12", "FOLATE", "FERRITIN", "TIBC", "IRON", "RETICCTPCT" in the last 72 hours. Sepsis Labs: Recent Labs  Lab 11/23/23 0550 11/23/23 1055  PROCALCITON  --  <0.10  LATICACIDVEN 1.3  --     Recent Results (from the past 240 hours)  Culture, blood (routine x 2)     Status: None (Preliminary result)   Collection Time: 11/23/23  5:50 AM   Specimen: BLOOD  Result Value Ref Range Status   Specimen Description BLOOD RIGHT ARM  Final   Special Requests   Final    BOTTLES DRAWN AEROBIC AND ANAEROBIC Blood Culture results may not be optimal due to an inadequate volume of blood received in culture bottles   Culture   Final    NO GROWTH 2 DAYS Performed at [facility], [address], [place], [place] [address]    Report Status PENDING  Incomplete  Culture, blood (routine x 2)     Status: None (Preliminary result)   Collection Time: 11/23/23  5:50 AM   Specimen: BLOOD  Result Value Ref Range Status   Specimen Description BLOOD LEFT ARM  Final   Special Requests   Final    BOTTLES DRAWN AEROBIC  AND ANAEROBIC Blood Culture results may not be optimal due to an inadequate volume of blood received in culture bottles   Culture   Final    NO GROWTH 2 DAYS Performed at [facility], [address] [place], [place] [address]    Report Status PENDING  Incomplete  Resp panel by RT-PCR (RSV, Flu A&B, Covid) Anterior Nasal Swab     Status: None   Collection Time: 11/23/23  5:50 AM   Specimen: Anterior Nasal Swab  Result Value Ref Range Status   SARS Coronavirus 2 by RT PCR NEGATIVE NEGATIVE Final    Comment: (NOTE) SARS-CoV-2 target nucleic acids are NOT DETECTED.  The SARS-CoV-2 RNA is generally detectable in upper respiratory specimens during the acute phase of infection. The lowest concentration of SARS-CoV-2 viral copies this assay can detect is 138 copies/mL. A negative result does not preclude SARS-Cov-2 infection and should not be used as the sole basis for treatment or other patient management decisions. A negative result may occur with  improper specimen collection/handling, submission of specimen other than nasopharyngeal swab, presence of viral mutation(s) within the areas targeted by this assay, and inadequate number of viral copies(<138 copies/mL). A negative result must be combined with clinical observations, patient history, and epidemiological information. The expected result is Negative.  Fact Sheet for Patients:  BloggerCourse.com  Fact Sheet for Healthcare Providers:  SeriousBroker.it  This test is no t yet approved or cleared by the [place]  FDA and  has been authorized for detection and/or diagnosis of SARS-CoV-2 by FDA under an Emergency Use Authorization (EUA). This EUA will remain  in effect (meaning this test can be used) for the duration of the COVID-19 declaration under Section 564(b)(1) of the Act, 21 U.S.C.section 360bbb-3(b)(1), unless the authorization is terminated  or revoked sooner.        Influenza A by PCR NEGATIVE NEGATIVE Final   Influenza B by PCR NEGATIVE NEGATIVE Final    Comment: (NOTE) The Xpert Xpress SARS-CoV-2/FLU/RSV plus assay is intended as an aid in the diagnosis of influenza from Nasopharyngeal swab specimens and should not be used as a sole basis for treatment. Nasal washings and aspirates are unacceptable for Xpert Xpress SARS-CoV-2/FLU/RSV testing.  Fact Sheet for Patients: BloggerCourse.com  Fact Sheet for Healthcare Providers: SeriousBroker.it  This test is not yet approved or cleared by the [place]  FDA and has been authorized for detection and/or diagnosis of SARS-CoV-2 by FDA under an Emergency Use  Authorization (EUA). This EUA will remain in effect (meaning this test can be used) for the duration of the COVID-19 declaration under Section 564(b)(1) of the Act, 21 U.S.C. section 360bbb-3(b)(1), unless the authorization is terminated or revoked.     Resp Syncytial Virus by PCR NEGATIVE NEGATIVE Final    Comment: (NOTE) Fact Sheet for Patients: BloggerCourse.com  Fact Sheet for Healthcare Providers: SeriousBroker.it  This test is not yet approved or cleared by the [place]  FDA and has been authorized for detection and/or diagnosis of SARS-CoV-2 by FDA under an Emergency Use Authorization (EUA). This EUA will remain in effect (meaning this test can be used) for the duration of the COVID-19 declaration under Section 564(b)(1) of the Act, 21 U.S.C. section 360bbb-3(b)(1), unless the authorization is terminated or revoked.  Performed at [facility], [address] [place], [place] [address]   Urine Culture     Status: Abnormal   Collection Time: 11/23/23  6:50 AM   Specimen: Urine, Random  Result Value Ref Range Status   Specimen Description   Final    URINE, RANDOM Performed at [facility], [address] [place], [place] [address]    Special Requests   Final    NONE Reflexed from [id number] Performed at [facility], [address] [place], [place] [address]    Culture 80,000 COLONIES/mL STAPHYLOCOCCUS HAEMOLYTICUS (A)  Final   Report Status 11/25/2023 FINAL  Final   Organism ID, Bacteria STAPHYLOCOCCUS HAEMOLYTICUS (A)  Final      Susceptibility   Staphylococcus haemolyticus - MIC*    CIPROFLOXACIN  >=8 RESISTANT Resistant     GENTAMICIN  8 INTERMEDIATE Intermediate     NITROFURANTOIN 64 INTERMEDIATE Intermediate     OXACILLIN >=4 RESISTANT Resistant     TETRACYCLINE <=1 SENSITIVE Sensitive     VANCOMYCIN  1 SENSITIVE Sensitive     TRIMETH/SULFA <=10 SENSITIVE Sensitive     RIFAMPIN <=0.5 SENSITIVE Sensitive     Inducible Clindamycin POSITIVE Resistant     * 80,000 COLONIES/mL STAPHYLOCOCCUS HAEMOLYTICUS  MRSA Next Gen by PCR, Nasal     Status: None   Collection Time: 11/23/23  1:56 PM   Specimen: Nasal Mucosa; Nasal Swab  Result Value Ref Range Status   MRSA by PCR Next Gen NOT DETECTED NOT DETECTED Final    Comment: (NOTE) The GeneXpert MRSA Assay (FDA approved for NASAL specimens only), is one component of a comprehensive MRSA colonization surveillance program. It is not intended to diagnose MRSA infection nor to guide or monitor treatment for MRSA infections. Test performance is not FDA approved in patients less than 89 years old. Performed at [facility], [address], [place], [place] [address]          Radiology Studies: US  Abdomen Limited RUQ (LIVER/GB) Result Date: 11/23/2023 CLINICAL DATA:  Cholelithiasis. EXAM: ULTRASOUND ABDOMEN LIMITED RIGHT UPPER QUADRANT COMPARISON:  CT earlier 11/23/2023. FINDINGS: Gallbladder: Dilated gallbladder. No wall thickening or reported Murphy's sign. Layering sludge. There are also some dependent small stones. Common bile duct: Diameter: 3 mm Liver: Grossly preserved hepatic echotexture when adjusted  for overlapping bowel gas and soft tissue. Small cysts seen once again along the inferior aspect of the right hepatic lobe which is near anechoic with through transmission. Thin septation. This measures 17 mm. Please correlate with prior CT portal vein is patent on color Doppler imaging with normal direction of blood flow towards the liver. Other: None. IMPRESSION: Dilated gallbladder with sludge and some small stones. No  further sonographic evidence of acute cholecystitis. If there is further concern a HIDA scan could be considered. No biliary ductal dilatation. Electronically Signed   By: Adrianna Horde M.D.   On: 11/23/2023 14:23        Scheduled Meds:  amLODipine   2.5 mg Oral Daily   aspirin   81 mg Oral Daily   atorvastatin   20 mg Oral QHS   clonazepam  1 mg Oral BID   cyanocobalamin   1,000 mcg Intramuscular Q30 days   enoxaparin  (LOVENOX ) injection  40 mg Subcutaneous Q24H   ferrous sulfate   325 mg Oral Q breakfast   furosemide   80 mg Oral Daily   levothyroxine   75 mcg Oral q morning   losartan   50 mg Oral Daily   pantoprazole   40 mg Oral Daily   polyethylene glycol  17 g Oral Daily   senna-docusate  1 tablet Oral BID   sertraline   150 mg Oral Daily   Continuous Infusions:   LOS: 2 days     Tiajuana Fluke, MD [facility]   If 7PM-7AM, please contact night-coverage  11/25/2023, 10:46 AM

## 2023-11-25 NOTE — Progress Notes (Signed)
 Patient is verbally and physically abusive to staff. Patient is not cooperative. Patient was given Geodon  and ativan  this shift to combat agitation.

## 2023-11-26 DIAGNOSIS — F03918 Unspecified dementia, unspecified severity, with other behavioral disturbance: Secondary | ICD-10-CM | POA: Diagnosis not present

## 2023-11-26 MED ORDER — FUROSEMIDE 40 MG PO TABS
40.0000 mg | ORAL_TABLET | Freq: Every day | ORAL | Status: DC
  Administered 2023-11-27 – 2023-11-29 (×3): 40 mg via ORAL
  Filled 2023-11-26 (×4): qty 1

## 2023-11-26 MED ORDER — OLANZAPINE 5 MG PO TABS
5.0000 mg | ORAL_TABLET | Freq: Three times a day (TID) | ORAL | Status: DC
  Administered 2023-11-27 – 2023-12-01 (×11): 5 mg via ORAL
  Filled 2023-11-26 (×16): qty 1

## 2023-11-26 NOTE — Progress Notes (Signed)
 Pt alert confused, refuses care/ meds ; combative, cursing and throwing hands. Vitals taken; stable. Pt refused to drink and spill water . No changes overnight, continue with plan of care.

## 2023-11-26 NOTE — Consult Note (Signed)
 [facility] Psychiatric Consult Follow up  Patient Name: .Hunter [name]  MRN: [id number]  DOB: 1934-04-26  Consult Order details:  Orders (From admission, onward)     Start     Ordered   11/19/23 1501  IP CONSULT TO PSYCHIATRY       Ordering Provider: Lind Repine, MD  Provider:  (Not yet assigned)  Question Answer Comment  Place call to: Psych NP   Reason for Consult Consult      11/19/23 1500   10/15/23 1359  IP CONSULT TO PSYCHIATRY       Ordering Provider: Viviano Ground, MD  Provider:  (Not yet assigned)  Question Answer Comment  Consult Timeframe URGENT - requires response within 12 hours   URGENT timeframe requires provider to provider communication, has the provider to provider communication been completed Yes   Reason for Consult? Consult for medication management   Contact phone number where the requesting provider can be reached [phone]      10/15/23 1358   10/09/23 0238  IP CONSULT TO PSYCHIATRY       Ordering Provider: Dewitt Forehand, DO  Provider:  (Not yet assigned)  Question Answer Comment  Consult Timeframe URGENT - requires response within 12 hours   URGENT timeframe requires provider to provider communication, has the provider to provider communication been completed Yes   Reason for Consult? Consult for medication management due to increasing agitation, not improving withy last medication adjustments 10/04/23   Contact phone number where the requesting provider can be reached [phone]      10/09/23 0238   10/08/23 1128  IP CONSULT TO PSYCHIATRY       Comments: Pt depakote  level being checked and psych needs to review/manage meds as agitation has continued.  Ordering Provider: Shane Darling, MD  Provider:  (Not yet assigned)  Question Answer Comment  Place call to: psych   Reason for Consult Other (See Comments) Medication mgmt  Diagnosis/Clinical Info for Consult: continued aggressive behavior      10/08/23 1132   08/24/23 0045  IP CONSULT TO  PSYCHIATRY       Ordering Provider: Norlene Beavers, MD  Provider:  (Not yet assigned)  Question Answer Comment  Place call to: psychiatry   Reason for Consult Consult   Diagnosis/Clinical Info for Consult: aggressive behavior      08/24/23 0044             Mode of Visit: In person    Psychiatry Consult Evaluation  Service Date: Nov 26, 2023 LOS:  LOS: 3 days  Chief Complaint agitation  Primary Psychiatric Diagnoses  Dementia with behavioral disturbance    Assessment  Hunter [name] is a 89 y.o. male admitted: Medicallyfor 08/24/2023 12:14 AM with hx of dementia brought in to ED for agitation form a facility. Patient has been in ED waiting for placement. Psychiatry is requested to get involved again as patient's agitation has been getting worse.  Sensory Deprivation: Legal guardian wants to bring patient's hearing aids and this provider recommended to bring in hearing aids as sensory deprivation can make confusion worse. EKG 11/19/23- QTC 539- discontinued PRN zyprexa  and increased ativan  2 mg Q6 hr PRN.  Discussed extensively with the legal guardian about the risk benefits of the current treatment in the context of patient's uncontrollable agitation and QTc prolongation and EKG.  Also discussed to consider DNR and palliative care given the poor quality of life and all the complications if medicated. Legal guardian  expressed her understanding and gave consent for the medication adjustments.  11/26/23: Patient continues to display severe aggression when not medicated in the context of worsening dementia/confusion/delirium.  Discussed extensively with the legal guardian about the risk versus safety related to the psychotropic medications that are being used to manage patient's aggressive behaviors.  Legal guardian expressed her understanding and agreed with the plan to get medical records and have a meeting with the director of DSS regarding DNR decision.  Legal guardian gave consent for  restarting Zyprexa  5 mg 3 times daily and adjust Klonopin as required.  Diagnoses:  Active Hospital problems: Principal Problem:   Aggressive behavior due to dementia [facility]) Active Problems:   Acquired hypothyroidism   Mixed hyperlipidemia   Essential hypertension   GERD (gastroesophageal reflux disease)   CKD (chronic kidney disease)   Heart failure with preserved ejection fraction (HFpEF)    Complete heart block (HCC)   Dementia (HCC)   Physically aggressive behavior   Hypothermia   Pressure injury of skin    Plan   ## Psychiatric Medication Recommendations:  Klonopin 1 mg BID Added back Zyprexa  5 mg TID ( minimize the use of PRN geodon  as it has more impact on Qtc) Zoloft  150 mg daily  ## Medical Decision Making Capacity: Not specifically addressed in this encounter  ## Further Work-up:  -- Depakote  levels every morning 11/23/23-79  -- most recent EKG on 11/24/2023 had QtC of 514 -- Pertinent labwork reviewed earlier this admission includes: CMP, ammonia levels   ## Disposition:--Recommend nursing home after better control of behavioral disturbance  ## Behavioral / Environmental: -Delirium Precautions: Delirium Interventions for Nursing and Staff: - RN to open blinds every AM. - To Bedside: Glasses, hearing aide, and pt's own shoes. Make available to patients. when possible and encourage use. - Encourage po fluids when appropriate, keep fluids within reach. - OOB to chair with meals. - Passive ROM exercises to all extremities with AM & PM care. - RN to assess orientation to person, time and place QAM and PRN. - Recommend extended visitation hours with familiar family/friends as feasible. - Staff to minimize disturbances at night. Turn off television when pt asleep or when not in use.    ## Safety and Observation Level:  - Based on my clinical evaluation, I estimate the patient to be at low risk of self harm in the current setting. - At this time, we recommend  routine. This  decision is based on my review of the chart including patient's history and current presentation, interview of the patient, mental status examination, and consideration of suicide risk including evaluating suicidal ideation, plan, intent, suicidal or self-harm behaviors, risk factors, and protective factors. This judgment is based on our ability to directly address suicide risk, implement suicide prevention strategies, and develop a safety plan while the patient is in the clinical setting. Please contact our team if there is a concern that risk level has changed.  CSSR Risk Category:C-SSRS RISK CATEGORY: No Risk  Suicide Risk Assessment: Unable to assess at this point as patient has hard of hearing and has no hearing aids, patient completely confused and unable to engage in a meaningful interview  Thank you for this consult request. Recommendations have been communicated to the primary team.  We will continue to follow-up at this time.   Ashan Cueva, MD       History of Present Illness  Hunter [name] is a 89 y.o. male admitted: Medicallyfor 08/24/2023 12:14 AM with hx  of dementia brought in to ED for agitation form a facility. Patient has been in ED waiting for placement. Psychiatry is requested to get involved again as patient's agitation has been getting worse. 11/26/23: Patient is noted to be sedated, in his bed.  He did not respond to verbal commands or physical touch.  Per nursing report patient was extremely aggressive today morning and received his Klonopin added in the ice cream which she took and has been calm and sleeping since then.    Psychiatric and Social History  Psychiatric History: Unable to obtain as there is no family member available, DSS legal guardian is new to the patient as patient is extremely confused  Social History:  Unable to obtain  Substance History Unable to obtain  Exam Findings  Physical Exam: Reviewed and agree with the physical exam findings conducted  by the ED provider. Vital Signs:  Temp:  [97.8 F (36.6 C)-99.1 F (37.3 C)] 98.2 F (36.8 C) (05/16 0958) Pulse Rate:  [78-79] 78 (05/16 0958) Resp:  [15-20] 15 (05/16 0958) BP: (112-125)/(54-60) 125/58 (05/16 0958) SpO2:  [98 %-100 %] 98 % (05/16 0958) Blood pressure (!) 125/58, pulse 78, temperature 98.2 F (36.8 C), resp. rate 15, height 6' (1.829 m), weight 97.5 kg, SpO2 98%. Body mass index is 29.16 kg/m.    Mental Status Exam: General Appearance: Disheveled  Orientation:  Negative  Memory:  Negative  Concentration:  pt sedated  Recall:  Negative  Attention  Other: pt hard of hearing  Eye Contact:  pt sedated  Speech:  pt sedated  Language:  pt sedated  Volume:  pt sedated  Mood: unable to assess  Affect:  Labile  Thought Process:  pt sedated  Thought Content:  pt sedated  Suicidal Thoughts:  unable to assess  Homicidal Thoughts:  unable to assess  Judgement:  Impaired  Insight:  Lacking  Psychomotor Activity:  pt sedated  Akathisia:  No  Fund of Knowledge:  Negative      Assets:  Resilience  Cognition:  Impaired,  Moderate  ADL's:  Impaired  AIMS (if indicated):        Other History   These have been pulled in through the EMR, reviewed, and updated if appropriate.  Family History:  The patient's family history includes Arthritis in an other family member; Hyperlipidemia in an other family member; Hypertension in an other family member; Lung cancer in his mother; Prostate cancer in an other family member.  Medical History: Past Medical History:  Diagnosis Date   Allergic rhinitis 02/02/2007   Anemia    Arthritis    "knees" (02/08/2017)   Chronic lower back pain    CKD (chronic kidney disease), stage III (HCC) 05/27/2011   Complete heart block 02/08/2017   Eczema    Essential hypertension 02/02/2007   Lasix  60 mg, losartan  50mg ,  Amlodipine  5mg --> 2.5 mg.  Usually have to repeat BP measurements  In the past-Maxzide  37.5-25mg .   GERD (gastroesophageal  reflux disease) occasional   Hearing loss of both ears    Heart failure with preserved ejection fraction (HFpEF) 12/06/2013   History of skin cancer 03/26/2014   nose    Hx of echocardiogram    Echo (07/2013): Mild LVH, EF 55-60%, grade 1 diastolic dysfunction, mild LAE, PASP 23   Hyperlipidemia    Hypertension    Hypothyroidism    LBBB (left bundle branch block)    Left patella fracture 2018   "no OR" (02/08/2017)   Major depression in  partial remission 02/02/2007   Amitriptyline  25mg  for sleep (stop when runs out of #10 04/2018(, zoloft  100-->150mg --> 200mg    Mild neurocognitive disorder 06/21/2019   Neuropathy (HCC) 11/02/2011   Nocturia    Peripheral vascular disease (HCC) LOWER EXTREMITIES   Spinal stenosis in cervical region 03/21/2010   Squamous cell skin cancer, penis: glans (HCC) 05/2010   Initial excision 11/11; recurrence, excision and laser Rx 9/13   Thrombocytopenia (HCC) 05/23/2012   Urinary frequency 09/05/2009   Possible BPH- see Trudi Fus notes    Vitamin D  deficiency 08/15/2008    Surgical History: Past Surgical History:  Procedure Laterality Date   CIRCUMCISION/ LASER DISSECTION PENILE GLANS CANCER  06-02-2010   CYSTOSCOPY WITH URETHRAL DILATATION  03/28/2012   Procedure: CYSTOSCOPY WITH URETHRAL DILATATION;  Surgeon: Edmund Gouge, MD;  Location: [facility];  Service: Urology;  Laterality: N/A;  excision biopsy extensive meatal penile carcinoma meatoplasty   EXCISION RIGHT WRIST BENIGN TUMOR  2002 (APPROX)   hip surgery     INCISION AND DRAINAGE DEEP NECK ABSCESS     INGUINAL HERNIA REPAIR  1970's   KNEE ARTHROSCOPY Right 2017   LUMBAR LAMINECTOMY/DECOMPRESSION MICRODISCECTOMY N/A 05/24/2020   Procedure: OPEN LAMINECTOMY LUMBAR THREE-LUMBAR FOUR;  Surgeon: Pincus Bridgeman, DO;  Location: [facility] OR;  Service: Neurosurgery;  Laterality: N/A;   PACEMAKER IMPLANT N/A 02/08/2017   Procedure: Pacemaker Implant;  Surgeon: Verona Goodwill, MD;  Location[facility]  INVASIVE CV LAB;  Service: Cardiovascular;  Laterality: N/A;   PACEMAKER IMPLANT  02/08/2017   SJM Assurity MRI dual chamber PPM implanted by Dr Rodolfo Clan for complete heart block   PENILE BX  05-09-2010   TONSILLECTOMY     TRANSURETHRAL RESECTION OF PROSTATE N/A 05/10/2015   Procedure: CYSTOSCOPY, URETHRAL MEATAL DILATION, TRANSURETHRAL RESECTION OF THE PROSTATE (TURP);  Surgeon: Annamarie Kid, MD;  Location: [facility];  Service: Urology;  Laterality: N/A;     Medications:   Current Facility-Administered Medications:    acetaminophen  (TYLENOL ) tablet 650 mg, 650 mg, Oral, Q6H PRN, Corrinne Din, MD   albuterol  (PROVENTIL ) (2.5 MG/3ML) 0.083% nebulizer solution 2.5 mg, 2.5 mg, Inhalation, Q8H PRN, Buell Carmin, MD   amLODipine  (NORVASC ) tablet 2.5 mg, 2.5 mg, Oral, Daily, Buell Carmin, MD, 2.5 mg at 11/26/23 1001   aspirin  chewable tablet 81 mg, 81 mg, Oral, Daily, Buell Carmin, MD, 81 mg at 11/26/23 1001   atorvastatin  (LIPITOR) tablet 20 mg, 20 mg, Oral, QHS, Buell Carmin, MD, 20 mg at 11/22/23 2106   bisacodyl  (DULCOLAX) suppository 10 mg, 10 mg, Rectal, Daily PRN, Mosetta Areola, Sudheer B, MD, 10 mg at 11/25/23 1201   clonazePAM (KLONOPIN) disintegrating tablet 1 mg, 1 mg, Oral, BID, Asmaa Tirpak, MD, 1 mg at 11/26/23 1000   cyanocobalamin  (VITAMIN B12) injection 1,000 mcg, 1,000 mcg, Intramuscular, Q30 days, Buell Carmin, MD, 1,000 mcg at 11/21/23 1018   enoxaparin  (LOVENOX ) injection 40 mg, 40 mg, Subcutaneous, Q24H, Corrinne Din, MD, 40 mg at 11/26/23 1001   ferrous sulfate  tablet 325 mg, 325 mg, Oral, Q breakfast, Buell Carmin, MD, 325 mg at 11/26/23 1001   [START ON 11/27/2023] furosemide  (LASIX ) tablet 40 mg, 40 mg, Oral, Daily, Sreenath, Sudheer B, MD   levothyroxine  (SYNTHROID ) tablet 75 mcg, 75 mcg, Oral, q morning, Buell Carmin, MD, 75 mcg at 11/26/23 1001   LORazepam  (ATIVAN ) injection 1 mg, 1 mg, Intravenous, PRN, Byungura, Veronique M, NP, 1 mg at 11/25/23 0013   LORazepam   (ATIVAN ) tablet 2 mg, 2 mg,  Oral, Q6H PRN, 2 mg at 11/22/23 1652 **OR** LORazepam  (ATIVAN ) injection 2 mg, 2 mg, Intramuscular, Q6H PRN, Ercell Perlman, MD, 2 mg at 11/24/23 1223   losartan  (COZAAR ) tablet 50 mg, 50 mg, Oral, Daily, Buell Carmin, MD, 50 mg at 11/26/23 1001   OLANZapine  (ZYPREXA ) tablet 5 mg, 5 mg, Oral, TID, Deepa Barthel, MD   ondansetron  (ZOFRAN ) tablet 4 mg, 4 mg, Oral, Q6H PRN **OR** ondansetron  (ZOFRAN ) injection 4 mg, 4 mg, Intravenous, Q6H PRN, Corrinne Din, MD   pantoprazole  (PROTONIX ) EC tablet 40 mg, 40 mg, Oral, Daily, Buell Carmin, MD, 40 mg at 11/26/23 1000   polyethylene glycol (MIRALAX / GLYCOLAX) packet 17 g, 17 g, Oral, Daily, Sreenath, Sudheer B, MD   senna-docusate (Senokot-S) tablet 1 tablet, 1 tablet, Oral, BID, Sreenath, Sudheer B, MD, 1 tablet at 11/26/23 1001   sertraline  (ZOLOFT ) tablet 150 mg, 150 mg, Oral, Daily, Lee, Jacqueline Eun, NP, 150 mg at 11/26/23 1001   ziprasidone  (GEODON ) injection 10 mg, 10 mg, Intramuscular, Q6H PRN, Margery Sheets B, MD, 10 mg at 11/25/23 2353  Allergies: No Known Allergies  Ezell Poke, MD

## 2023-11-26 NOTE — Plan of Care (Signed)
   Problem: Clinical Measurements: Goal: Ability to maintain clinical measurements within normal limits will improve Outcome: Progressing

## 2023-11-26 NOTE — Progress Notes (Signed)
 Occupational Therapy Treatment Patient Details Name: Hunter [name] MRN: [id number] DOB: Mar 07, 1934 Today's Date: 11/26/2023   History of present illness Pt is an 89 y/o M admitted on 08/24/23 under IVC for aggression with staff & behavioral challenges (per psych, due to baseline dementia) at his facility. PMH: chronic LBP, CKD 3, essential HTN, HLD, L BBB, major depression, neuropathy, PVD.  Course complicated by hypothermia; transferred to med-surg floor (11/23/23) for management of SIRS, unknown etiology.   OT comments  Pt seen for OT tx overlapping with PT to optimize safety and mobility efforts. Pt malpositioned in bed, LLE flexed, partially side lying, and attempting to get his mit off his L hand. Pt initially resistant to any type of movement and keeps his eyes closed throughout the session, unable to follow simple commands. Pt required MAX A +2 for safety with bed mobility for improved joint protection, pressure relief, and to optimize his condom catheter output/flow. After repositioning, OT performed passive ROM for BUE with notable improvement in relaxation after repetitions. Pt required hand over hand TOTAL A for washing face and oral care using sponge. Pt limited by lethargy today, continues to benefit from OT services to optimize safety/indep with ADL/mobility.       If plan is discharge home, recommend the following:  Supervision due to cognitive status;Assist for transportation;Assistance with cooking/housework;Help with stairs or ramp for entrance;Two people to help with walking and/or transfers;Direct supervision/assist for financial management;Direct supervision/assist for medications management;Two people to help with bathing/dressing/bathroom   Equipment Recommendations  Other (comment) (defer)       Precautions / Restrictions Precautions Precautions: Fall Recall of Precautions/Restrictions: Impaired Precaution/Restrictions Comments: hx of restlessness, agitation and  aggression. however cooperative, & pleasant with therapy Restrictions Weight Bearing Restrictions Per Provider Order: No       Mobility Bed Mobility Overal bed mobility: Needs Assistance Bed Mobility: Rolling Rolling: Max assist, +2 for safety/equipment         General bed mobility comments: repositioned in bed. for comfort         ADL either performed or assessed with clinical judgement   ADL Overall ADL's : Needs assistance/impaired     Grooming: Bed level;Oral care;Wash/dry face;Total assistance         Communication Communication Communication: Impaired Factors Affecting Communication: Hearing impaired   Cognition Arousal: Lethargic, Suspect due to medications Behavior During Therapy: Flat affect, Restless Cognition: History of cognitive impairments, Cognition impaired             OT - Cognition Comments: lethargic and decreased responses this date; pt keeping eyes closed throughout session                 Following commands: Impaired Following commands impaired: Follows one step commands inconsistently      Cueing   Cueing Techniques: Verbal cues, Tactile cues  Exercises Other Exercises Other Exercises: BUE gentle PROM to improve comfort, joint support, and prevent injury            Pertinent Vitals/ Pain       Pain Assessment Pain Assessment: Faces Faces Pain Scale: Hurts little more Pain Location: L knee but improved with ROM Pain Descriptors / Indicators: Discomfort Pain Intervention(s): Limited activity within patient's tolerance, Monitored during session, Repositioned   Frequency  Min 2X/week        Progress Toward Goals  OT Goals(current goals can now be found in the care plan section)  Progress towards OT goals: OT to reassess next treatment  Acute Rehab  OT Goals OT Goal Formulation: Patient unable to participate in goal setting Time For Goal Achievement: 12/16/23 Potential to Achieve Goals: Poor  Plan       Co-evaluation    PT/OT/SLP Co-Evaluation/Treatment: Yes Reason for Co-Treatment: Complexity of the patient's impairments (multi-system involvement);For patient/therapist safety PT goals addressed during session: Mobility/safety with mobility;Strengthening/ROM OT goals addressed during session: ADL's and self-care;Strengthening/ROM      AM-PAC OT "6 Clicks" Daily Activity     Outcome Measure   Help from another person eating meals?: Total Help from another person taking care of personal grooming?: Total Help from another person toileting, which includes using toliet, bedpan, or urinal?: Total Help from another person bathing (including washing, rinsing, drying)?: Total Help from another person to put on and taking off regular upper body clothing?: Total Help from another person to put on and taking off regular lower body clothing?: Total 6 Click Score: 6    End of Session    OT Visit Diagnosis: Unsteadiness on feet (R26.81);Repeated falls (R29.6);Muscle weakness (generalized) (M62.81);Other symptoms and signs involving cognitive function   Activity Tolerance Patient limited by lethargy   Patient Left in bed;with call bell/phone within reach;with bed alarm set   Nurse Communication Other (comment) (found toenail in bed)        Time: 1050-1106 OT Time Calculation (min): 16 min  Charges: OT General Charges $OT Visit: 1 Visit OT Treatments $Self Care/Home Management : 8-22 mins  Berenda Breaker., MPH, MS, OTR/L ascom [phone] 11/26/23, 12:27 PM

## 2023-11-26 NOTE — Progress Notes (Signed)
 PROGRESS NOTE    Hunter [name]  UJW:[id number] DOB: March 28, 1934 DOA: 08/24/2023 PCP: Almira Jaeger, MD    Brief Narrative:   89 y.o. male with medical history significant of hypertension, chronic HFpEF, hyperlipidemia, complete bundle branch block status post pacemaker, stage III CKD, dementia currently in ED holding.  Requesting admission for hypothermia as well as lethargy.  Per report, patient has been a longstanding boarding patient in the ER for psychiatric issues including end-stage dementia and agitation.  Per report, patient with worsening lethargy over the course of the night.  This is a fairly new finding though patient has significant agitation as well as secondary lethargy after antipsychotic regimen.  Patient noted had a rectal temp around 95-96 on evaluation by nursing.  No reports of cough, shortness of breath.  No reports of chest pain.  No abdominal pain or reported diarrhea.  No reported falls.  Systolic pressures have been fairly stable though with pressures dropping into the 90s overnight.  Noted chronic small sacral pressure ulcer.  Psychiatry noted have been consulted on May 9 for worsening agitation in setting of end-stage dementia.  Medications including Depakote  and Zyprexa  ordered for psychiatric management. Overnight in the ER with a Tmax of 95.5, heart rate 50s to 70s.  Respirations into the 20s.  Blood pressure 90s to 110s over 40s to 50s.  Satting well on room air.  White count 5.1, hemoglobin 14.5, platelets 100, creatinine 1.53.  LFTs and T. bili grossly within normal limits.  Urinalysis not indicative of infection.  Chest x-ray and CT head grossly within normal limits.  CT abdomen pelvis with initial concern for gallbladder pathology however ruled out.  General surgery consultation or requested and appreciated.  No surgical intervention warranted.  General surgery has signed off as of 5/14.  LFTs have normalized.  5/14: Patient remain as agitated, dangerous behavior  towards staff.    Assessment & Plan:   Principal Problem:   Aggressive behavior due to dementia [facility]) Active Problems:   Hypothermia   Heart failure with preserved ejection fraction (HFpEF)    Acquired hypothyroidism   Mixed hyperlipidemia   Essential hypertension   GERD (gastroesophageal reflux disease)   CKD (chronic kidney disease)   Complete heart block (HCC)   Dementia (HCC)   Physically aggressive behavior   Pressure injury of skin  Dementia (HCC) Severe agitation Patient has been persistently agitated.  He was in the ED holding for an extended period of time greater than 3 months.  During that time psychiatry service was titrating his medications however medication titration was limited by QTc prolongation and hypothermia.  I have discussed patient's care with psychiatric consultant.  At this time we will add back Zyprexa  and Klonopin.  Minimize use of ziprasidone .  Functional decline Poor quality of life Adult failure to thrive Patient has no quality of life.  He has been in the hospital for greater than 3 months.  He has been extremely agitated at times.  Violent with behaviors of hitting, kicking, spitting at, biting nursing staff.  Unfortunately the patient's antipsychotic regimen may put him at increased risk of cardiac complications.  This may be increasing his morbidity.   Plan: At this time I strongly recommend making the patient a DO NOT RESUSCITATE and further consideration for palliative care/engagement of hospice services.  The patient's oral intake has been poor and he is on several antipsychotics that are increasing his underlying risk of arrhythmia and cardiovascular complications.  Psychiatry consultant has spoken with  the patient's legal guardian who will escalate the case to DSS supervisors.  If any information is needed from the medical team please reach out to us .    Hypothermia Borderline SIRS Temperature 95.5 in ED on admission Meeting borderline  SIRS criteria with hypothermia as well as respirations at 20 White count within normal limits Procalcitonin negative.  Lactate normal. No clinical evidence of infection No evidence of sepsis.  Ruled out Plan: Continue to hold antibiotics.  Continue to monitor vitals and fever curve.  Low threshold to restart antibiotic should he develop any clinical signs of infection.  At this particular moment there is no clinical indication of any underlying infection   Heart failure with preserved ejection fraction (HFpEF)  2D echo May 2024 with EF of 50 to 55% and grade 1 diastolic dysfunction Appears euvolemic.  Continue diuretic and antihypertensive regimen       Complete heart block (HCC) Status post pacemaker   CKD (chronic kidney disease) Creatinine 1.5 today with GFR in the 50s Appears near baseline Monitor   GERD (gastroesophageal reflux disease) PPI   Essential hypertension Systolic pressures 90s on presentation with overlapping hypothermia Antihypertensive and diuretic restarted   Mixed hyperlipidemia CK normal.  Continue Lipitor   Acquired hypothyroidism Continue Synthroid  TSH normal   DVT prophylaxis: SQ Lovenox  Code Status: Full Family Communication: None Disposition Plan: Status is: Inpatient Remains inpatient appropriate because: Unsafe discharge plan.  Patient is medically appropriate for discharge at this time.    Level of care: Telemetry Medical  Consultants:  Psychiatry  Procedures:  None  Antimicrobials: None   Subjective: Seen and examined.  Lethargic/obtunded.  Unable to provide history.  Not agitated at the time of my evaluation.  Objective: Vitals:   11/25/23 1616 11/25/23 2000 11/26/23 0433 11/26/23 0958  BP: (!) 112/54 122/60 (!) 116/56 (!) 125/58  Pulse: 78 79 78 78  Resp: 16 18 20 15   Temp:  97.8 F (36.6 C) 99.1 F (37.3 C) 98.2 F (36.8 C)  TempSrc:      SpO2: 100% 100% 100% 98%  Weight:      Height:        Intake/Output  Summary (Last 24 hours) at 11/26/2023 1257 Last data filed at 11/26/2023 0930 Gross per 24 hour  Intake --  Output 475 ml  Net -475 ml   Filed Weights   08/23/23 2222  Weight: 97.5 kg    Examination:  General exam: Chronically ill-appearing.  Fatigued.  Disheveled Respiratory system: Poor resp effort.  Normal work of breathing.  Room air Cardiovascular system: S1-S2, RRR, no murmurs, trace pedal edema Gastrointestinal system: Soft, NT/ND, normal bowel sounds Central nervous system: Awake.  Lethargic.  Unable to assess orientation Extremities: Symmetric 5 x 5 power.  Gait not assessed Skin: No rashes, lesions or ulcers Psychiatry: Unable to assess    Data Reviewed: I have personally reviewed following labs and imaging studies  CBC: Recent Labs  Lab 11/20/23 0444 11/23/23 0550 11/24/23 0806  WBC 5.1 5.1 4.4  NEUTROABS 2.5 1.8  --   HGB 11.4* 14.5 13.2  HCT 34.3* 45.2 39.2  MCV 81.3 84.3 81.0  PLT 74* 100* 84*   Basic Metabolic Panel: Recent Labs  Lab 11/19/23 1750 11/23/23 0550 11/24/23 0806  NA 134* 135 141  K 3.2* 3.6 3.9  CL 100 100 111  CO2 24 25 24   GLUCOSE 88 95 89  BUN 32* 34* 26*  CREATININE 1.69* 1.53* 1.30*  CALCIUM  8.4* 9.1 8.9  MG  2.0  --   --    GFR: Estimated Creatinine Clearance: 48.5 mL/min (A) (by C-G formula based on SCr of 1.3 mg/dL (H)). Liver Function Tests: Recent Labs  Lab 11/19/23 1750 11/23/23 0550 11/24/23 0806  AST 45* 44* 35  ALT 32 30 26  ALKPHOS 70 87 69  BILITOT 0.6 0.8 0.9  PROT 5.2* 6.2* 5.4*  ALBUMIN 3.0* 3.6 3.0*   No results for input(s): "LIPASE", "AMYLASE" in the last 168 hours. Recent Labs  Lab 11/19/23 1730 11/23/23 0911  AMMONIA 18 <13   Coagulation Profile: Recent Labs  Lab 11/23/23 0945  INR 1.1   Cardiac Enzymes: Recent Labs  Lab 11/23/23 0911  CKTOTAL 21*   BNP (last 3 results) No results for input(s): "PROBNP" in the last 8760 hours. HbA1C: No results for input(s): "HGBA1C" in the  last 72 hours. CBG: Recent Labs  Lab 11/23/23 0554  GLUCAP 96   Lipid Profile: No results for input(s): "CHOL", "HDL", "LDLCALC", "TRIG", "CHOLHDL", "LDLDIRECT" in the last 72 hours. Thyroid  Function Tests: No results for input(s): "TSH", "T4TOTAL", "FREET4", "T3FREE", "THYROIDAB" in the last 72 hours.  Anemia Panel: No results for input(s): "VITAMINB12", "FOLATE", "FERRITIN", "TIBC", "IRON", "RETICCTPCT" in the last 72 hours. Sepsis Labs: Recent Labs  Lab 11/23/23 0550 11/23/23 1055  PROCALCITON  --  <0.10  LATICACIDVEN 1.3  --     Recent Results (from the past 240 hours)  Culture, blood (routine x 2)     Status: None (Preliminary result)   Collection Time: 11/23/23  5:50 AM   Specimen: BLOOD  Result Value Ref Range Status   Specimen Description BLOOD RIGHT ARM  Final   Special Requests   Final    BOTTLES DRAWN AEROBIC AND ANAEROBIC Blood Culture results may not be optimal due to an inadequate volume of blood received in culture bottles   Culture   Final    NO GROWTH 3 DAYS Performed at [facility], [address] [place], [place] [address]    Report Status PENDING  Incomplete  Culture, blood (routine x 2)     Status: None (Preliminary result)   Collection Time: 11/23/23  5:50 AM   Specimen: BLOOD  Result Value Ref Range Status   Specimen Description BLOOD LEFT ARM  Final   Special Requests   Final    BOTTLES DRAWN AEROBIC AND ANAEROBIC Blood Culture results may not be optimal due to an inadequate volume of blood received in culture bottles   Culture   Final    NO GROWTH 3 DAYS Performed at [facility], [address], [place], [place] [address]    Report Status PENDING  Incomplete  Resp panel by RT-PCR (RSV, Flu A&B, Covid) Anterior Nasal Swab     Status: None   Collection Time: 11/23/23  5:50 AM   Specimen: Anterior Nasal Swab  Result Value Ref Range Status   SARS Coronavirus 2 by RT PCR NEGATIVE NEGATIVE Final    Comment:  (NOTE) SARS-CoV-2 target nucleic acids are NOT DETECTED.  The SARS-CoV-2 RNA is generally detectable in upper respiratory specimens during the acute phase of infection. The lowest concentration of SARS-CoV-2 viral copies this assay can detect is 138 copies/mL. A negative result does not preclude SARS-Cov-2 infection and should not be used as the sole basis for treatment or other patient management decisions. A negative result may occur with  improper specimen collection/handling, submission of specimen other than nasopharyngeal swab, presence of viral mutation(s) within the areas targeted  by this assay, and inadequate number of viral copies(<138 copies/mL). A negative result must be combined with clinical observations, patient history, and epidemiological information. The expected result is Negative.  Fact Sheet for Patients:  BloggerCourse.com  Fact Sheet for Healthcare Providers:  SeriousBroker.it  This test is no t yet approved or cleared by the [place]  FDA and  has been authorized for detection and/or diagnosis of SARS-CoV-2 by FDA under an Emergency Use Authorization (EUA). This EUA will remain  in effect (meaning this test can be used) for the duration of the COVID-19 declaration under Section 564(b)(1) of the Act, 21 U.S.C.section 360bbb-3(b)(1), unless the authorization is terminated  or revoked sooner.       Influenza A by PCR NEGATIVE NEGATIVE Final   Influenza B by PCR NEGATIVE NEGATIVE Final    Comment: (NOTE) The Xpert Xpress SARS-CoV-2/FLU/RSV plus assay is intended as an aid in the diagnosis of influenza from Nasopharyngeal swab specimens and should not be used as a sole basis for treatment. Nasal washings and aspirates are unacceptable for Xpert Xpress SARS-CoV-2/FLU/RSV testing.  Fact Sheet for Patients: BloggerCourse.com  Fact Sheet for Healthcare  Providers: SeriousBroker.it  This test is not yet approved or cleared by the [place]  FDA and has been authorized for detection and/or diagnosis of SARS-CoV-2 by FDA under an Emergency Use Authorization (EUA). This EUA will remain in effect (meaning this test can be used) for the duration of the COVID-19 declaration under Section 564(b)(1) of the Act, 21 U.S.C. section 360bbb-3(b)(1), unless the authorization is terminated or revoked.     Resp Syncytial Virus by PCR NEGATIVE NEGATIVE Final    Comment: (NOTE) Fact Sheet for Patients: BloggerCourse.com  Fact Sheet for Healthcare Providers: SeriousBroker.it  This test is not yet approved or cleared by the [place]  FDA and has been authorized for detection and/or diagnosis of SARS-CoV-2 by FDA under an Emergency Use Authorization (EUA). This EUA will remain in effect (meaning this test can be used) for the duration of the COVID-19 declaration under Section 564(b)(1) of the Act, 21 U.S.C. section 360bbb-3(b)(1), unless the authorization is terminated or revoked.  Performed at [facility], [address] [place], [place] [address]   Urine Culture     Status: Abnormal   Collection Time: 11/23/23  6:50 AM   Specimen: Urine, Random  Result Value Ref Range Status   Specimen Description   Final    URINE, RANDOM Performed at [facility], [address] [place], [place] [address]    Special Requests   Final    NONE Reflexed from [id number] Performed at [facility], [address] [place], [place] [address]    Culture 80,000 COLONIES/mL STAPHYLOCOCCUS HAEMOLYTICUS (A)  Final   Report Status 11/25/2023 FINAL  Final   Organism ID, Bacteria STAPHYLOCOCCUS HAEMOLYTICUS (A)  Final      Susceptibility   Staphylococcus haemolyticus - MIC*    CIPROFLOXACIN  >=8 RESISTANT Resistant     GENTAMICIN  8 INTERMEDIATE Intermediate      NITROFURANTOIN 64 INTERMEDIATE Intermediate     OXACILLIN >=4 RESISTANT Resistant     TETRACYCLINE <=1 SENSITIVE Sensitive     VANCOMYCIN  1 SENSITIVE Sensitive     TRIMETH/SULFA <=10 SENSITIVE Sensitive     RIFAMPIN <=0.5 SENSITIVE Sensitive     Inducible Clindamycin POSITIVE Resistant     * 80,000 COLONIES/mL STAPHYLOCOCCUS HAEMOLYTICUS  MRSA Next Gen by PCR, Nasal     Status: None   Collection Time: 11/23/23  1:56 PM  Specimen: Nasal Mucosa; Nasal Swab  Result Value Ref Range Status   MRSA by PCR Next Gen NOT DETECTED NOT DETECTED Final    Comment: (NOTE) The GeneXpert MRSA Assay (FDA approved for NASAL specimens only), is one component of a comprehensive MRSA colonization surveillance program. It is not intended to diagnose MRSA infection nor to guide or monitor treatment for MRSA infections. Test performance is not FDA approved in patients less than 40 years old. Performed at [name], [address] [place], [place] [address]          Radiology Studies: No results found.       Scheduled Meds:  amLODipine   2.5 mg Oral Daily   aspirin   81 mg Oral Daily   atorvastatin   20 mg Oral QHS   clonazepam  1 mg Oral BID   cyanocobalamin   1,000 mcg Intramuscular Q30 days   enoxaparin  (LOVENOX ) injection  40 mg Subcutaneous Q24H   ferrous sulfate   325 mg Oral Q breakfast   [START ON 11/27/2023] furosemide   40 mg Oral Daily   levothyroxine   75 mcg Oral q morning   losartan   50 mg Oral Daily   OLANZapine   5 mg Oral TID   pantoprazole   40 mg Oral Daily   polyethylene glycol  17 g Oral Daily   senna-docusate  1 tablet Oral BID   sertraline   150 mg Oral Daily   Continuous Infusions:   LOS: 3 days     Tiajuana Fluke, MD [facility]   If 7PM-7AM, please contact night-coverage  11/26/2023, 12:57 PM

## 2023-11-26 NOTE — Progress Notes (Signed)
 Physical Therapy Treatment Patient Details Name: DELDRICK YOUTZ MRN: [id number] DOB: 1933-10-26 Today's Date: 11/26/2023   History of Present Illness Pt is an 89 y/o M admitted on 08/24/23 under IVC for aggression with staff & behavioral challenges (per psych, due to baseline dementia) at his facility. PMH: chronic LBP, CKD 3, essential HTN, HLD, L BBB, major depression, neuropathy, PVD.  Course complicated by hypothermia; transferred to med-surg floor (11/23/23) for management of SIRS, unknown etiology.    PT Comments  Co-tx/overlap session with OT.  Pt in bed with poor positioning and generally appears uncomfortable.  Per RN, pt received meds this am due to aggressive behaviors and generally lethargic today unable to participate effectively in session.  Positioning and comfort needs addressed and OT remained in room for continued interventions. Pt unable to participate in sitting or OOB activities today.   If plan is discharge home, recommend the following: Assistance with cooking/housework;Direct supervision/assist for medications management;Assist for transportation;Help with stairs or ramp for entrance;Supervision due to cognitive status;Two people to help with walking and/or transfers;Two people to help with bathing/dressing/bathroom   Can travel by private vehicle        Equipment Recommendations       Recommendations for Other Services       Precautions / Restrictions Precautions Precautions: Fall Recall of Precautions/Restrictions: Impaired Precaution/Restrictions Comments: hx of restlessness, agitation and aggression. however cooperative, & pleasant with therapy Restrictions Weight Bearing Restrictions Per Provider Order: No     Mobility  Bed Mobility Overal bed mobility: Needs Assistance Bed Mobility: Rolling Rolling: Max assist, +2 for safety/equipment         General bed mobility comments: repositioned in bed. for comfort    Transfers                         Ambulation/Gait                   Stairs             Wheelchair Mobility     Tilt Bed    Modified Rankin (Stroke Patients Only)       Balance                                            Communication Communication Communication: Impaired Factors Affecting Communication: Hearing impaired  Cognition Arousal: Lethargic, Suspect due to medications Behavior During Therapy: Flat affect, Restless   PT - Cognitive impairments: History of cognitive impairments                       PT - Cognition Comments: lethargiv.  received meds earlier due to agression Following commands: Impaired Following commands impaired: Follows one step commands inconsistently    Cueing Cueing Techniques: Verbal cues, Tactile cues  Exercises Other Exercises Other Exercises: general ROM and positioning for comfort    General Comments        Pertinent Vitals/Pain Pain Assessment Pain Assessment: Faces Faces Pain Scale: Hurts little more Pain Location: L knee but improved with ROM Pain Descriptors / Indicators: Discomfort Pain Intervention(s): Limited activity within patient's tolerance, Monitored during session, Repositioned    Home Living                          Prior Function  PT Goals (current goals can now be found in the care plan section) Progress towards PT goals: Progressing toward goals    Frequency    Min 1X/week      PT Plan      Co-evaluation PT/OT/SLP Co-Evaluation/Treatment: Yes Reason for Co-Treatment: Complexity of the patient's impairments (multi-system involvement);For patient/therapist safety PT goals addressed during session: Mobility/safety with mobility;Strengthening/ROM OT goals addressed during session: ADL's and self-care;Strengthening/ROM      AM-PAC PT "6 Clicks" Mobility   Outcome Measure  Help needed turning from your back to your side while in a flat bed without using  bedrails?: A Lot Help needed moving from lying on your back to sitting on the side of a flat bed without using bedrails?: A Lot Help needed moving to and from a bed to a chair (including a wheelchair)?: A Lot Help needed standing up from a chair using your arms (e.g., wheelchair or bedside chair)?: A Lot Help needed to walk in hospital room?: A Lot Help needed climbing 3-5 steps with a railing? : Total 6 Click Score: 11    End of Session   Activity Tolerance: Patient limited by lethargy Patient left: in bed;with bed alarm set;with call bell/phone within reach Nurse Communication: Mobility status PT Visit Diagnosis: Unsteadiness on feet (R26.81);Muscle weakness (generalized) (M62.81);Other abnormalities of gait and mobility (R26.89);Difficulty in walking, not elsewhere classified (R26.2);History of falling (Z91.81)     Time: 1050-1058 PT Time Calculation (min) (ACUTE ONLY): 8 min  Charges:    $Therapeutic Activity: 8-22 mins PT General Charges $$ ACUTE PT VISIT: 1 Visit                   Charlanne Cong, PTA 11/26/23, 11:06 AM

## 2023-11-26 NOTE — Care Management Important Message (Signed)
 Important Message  Patient Details  Name: [name] MRN: [id number] Date of Birth: 06-23-1934   Important Message Given:  Yes - Medicare IM     Anise Kerns 11/26/2023, 3:36 PM

## 2023-11-26 NOTE — Plan of Care (Signed)
  Problem: Clinical Measurements: Goal: Ability to maintain clinical measurements within normal limits will improve Outcome: Progressing   Problem: Education: Goal: Knowledge of General Education information will improve Description: Including pain rating scale, medication(s)/side effects and non-pharmacologic comfort measures Outcome: Not Progressing   Problem: Health Behavior/Discharge Planning: Goal: Ability to manage health-related needs will improve Outcome: Not Progressing   Problem: Clinical Measurements: Goal: Will remain free from infection Outcome: Not Progressing Goal: Diagnostic test results will improve Outcome: Not Progressing Goal: Respiratory complications will improve Outcome: Not Progressing Goal: Cardiovascular complication will be avoided Outcome: Not Progressing   Problem: Activity: Goal: Risk for activity intolerance will decrease Outcome: Not Progressing   Problem: Nutrition: Goal: Adequate nutrition will be maintained Outcome: Not Progressing   Problem: Coping: Goal: Level of anxiety will decrease Outcome: Not Progressing   Problem: Elimination: Goal: Will not experience complications related to bowel motility Outcome: Not Progressing Goal: Will not experience complications related to urinary retention Outcome: Not Progressing   Problem: Pain Managment: Goal: General experience of comfort will improve and/or be controlled Outcome: Not Progressing   Problem: Safety: Goal: Ability to remain free from injury will improve Outcome: Not Progressing   Problem: Skin Integrity: Goal: Risk for impaired skin integrity will decrease Outcome: Not Progressing   Problem: Fluid Volume: Goal: Hemodynamic stability will improve Outcome: Not Progressing   Problem: Clinical Measurements: Goal: Diagnostic test results will improve Outcome: Not Progressing Goal: Signs and symptoms of infection will decrease Outcome: Not Progressing   Problem:  Respiratory: Goal: Ability to maintain adequate ventilation will improve Outcome: Not Progressing

## 2023-11-27 DIAGNOSIS — F03918 Unspecified dementia, unspecified severity, with other behavioral disturbance: Secondary | ICD-10-CM | POA: Diagnosis not present

## 2023-11-27 LAB — CBC WITH DIFFERENTIAL/PLATELET
Abs Immature Granulocytes: 0.08 10*3/uL — ABNORMAL HIGH (ref 0.00–0.07)
Basophils Absolute: 0.1 10*3/uL (ref 0.0–0.1)
Basophils Relative: 0 %
Eosinophils Absolute: 0 10*3/uL (ref 0.0–0.5)
Eosinophils Relative: 0 %
HCT: 45.5 % (ref 39.0–52.0)
Hemoglobin: 15.1 g/dL (ref 13.0–17.0)
Immature Granulocytes: 1 %
Lymphocytes Relative: 32 %
Lymphs Abs: 4.1 10*3/uL — ABNORMAL HIGH (ref 0.7–4.0)
MCH: 26.8 pg (ref 26.0–34.0)
MCHC: 33.2 g/dL (ref 30.0–36.0)
MCV: 80.8 fL (ref 80.0–100.0)
Monocytes Absolute: 1.2 10*3/uL — ABNORMAL HIGH (ref 0.1–1.0)
Monocytes Relative: 9 %
Neutro Abs: 7.3 10*3/uL (ref 1.7–7.7)
Neutrophils Relative %: 58 %
Platelets: 97 10*3/uL — ABNORMAL LOW (ref 150–400)
RBC: 5.63 MIL/uL (ref 4.22–5.81)
RDW: 15.9 % — ABNORMAL HIGH (ref 11.5–15.5)
WBC: 12.8 10*3/uL — ABNORMAL HIGH (ref 4.0–10.5)
nRBC: 0 % (ref 0.0–0.2)

## 2023-11-27 LAB — BASIC METABOLIC PANEL WITH GFR
Anion gap: 12 (ref 5–15)
BUN: 40 mg/dL — ABNORMAL HIGH (ref 8–23)
CO2: 20 mmol/L — ABNORMAL LOW (ref 22–32)
Calcium: 8.9 mg/dL (ref 8.9–10.3)
Chloride: 110 mmol/L (ref 98–111)
Creatinine, Ser: 1.62 mg/dL — ABNORMAL HIGH (ref 0.61–1.24)
GFR, Estimated: 41 mL/min — ABNORMAL LOW (ref >60)
Glucose, Bld: 123 mg/dL — ABNORMAL HIGH (ref 70–99)
Potassium: 3.6 mmol/L (ref 3.5–5.1)
Sodium: 142 mmol/L (ref 135–145)

## 2023-11-27 NOTE — Progress Notes (Signed)
 PROGRESS NOTE    Hunter [name]  ZOX:[id number] DOB: 1933/12/28 DOA: 08/24/2023 PCP: Almira Jaeger, MD    Brief Narrative:   89 y.o. male with medical history significant of hypertension, chronic HFpEF, hyperlipidemia, complete bundle branch block status post pacemaker, stage III CKD, dementia currently in ED holding.  Requesting admission for hypothermia as well as lethargy.  Per report, patient has been a longstanding boarding patient in the ER for psychiatric issues including end-stage dementia and agitation.  Per report, patient with worsening lethargy over the course of the night.  This is a fairly new finding though patient has significant agitation as well as secondary lethargy after antipsychotic regimen.  Patient noted had a rectal temp around 95-96 on evaluation by nursing.  No reports of cough, shortness of breath.  No reports of chest pain.  No abdominal pain or reported diarrhea.  No reported falls.  Systolic pressures have been fairly stable though with pressures dropping into the 90s overnight.  Noted chronic small sacral pressure ulcer.  Psychiatry noted have been consulted on May 9 for worsening agitation in setting of end-stage dementia.  Medications including Depakote  and Zyprexa  ordered for psychiatric management. Overnight in the ER with a Tmax of 95.5, heart rate 50s to 70s.  Respirations into the 20s.  Blood pressure 90s to 110s over 40s to 50s.  Satting well on room air.  White count 5.1, hemoglobin 14.5, platelets 100, creatinine 1.53.  LFTs and T. bili grossly within normal limits.  Urinalysis not indicative of infection.  Chest x-ray and CT head grossly within normal limits.  CT abdomen pelvis with initial concern for gallbladder pathology however ruled out.  General surgery consultation or requested and appreciated.  No surgical intervention warranted.  General surgery has signed off as of 5/14.  LFTs have normalized.  5/14: Patient remain as agitated, dangerous behavior  towards staff.    Assessment & Plan:   Principal Problem:   Aggressive behavior due to dementia [facility]) Active Problems:   Hypothermia   Heart failure with preserved ejection fraction (HFpEF)    Acquired hypothyroidism   Mixed hyperlipidemia   Essential hypertension   GERD (gastroesophageal reflux disease)   CKD (chronic kidney disease)   Complete heart block (HCC)   Dementia (HCC)   Physically aggressive behavior   Pressure injury of skin  Dementia (HCC) Severe agitation Patient has been persistently agitated.  He was in the ED holding for an extended period of time greater than 3 months.  During that time psychiatry service was titrating his medications however medication titration was limited by QTc prolongation and hypothermia.  I have discussed patient's care with psychiatric consultant.  Recommendations appreciated.  Zyprexa  5 mg 3 times daily and adjust Klonopin  as needed  Functional decline Poor quality of life Adult failure to thrive Patient has no quality of life.  He has been in the hospital for greater than 3 months.  He has been extremely agitated at times.  Violent with behaviors of hitting, kicking, spitting at, biting nursing staff.  Unfortunately the patient's antipsychotic regimen may put him at increased risk of cardiac complications.  This may be increasing his morbidity.   Plan: At this time I strongly recommend making the patient a DO NOT RESUSCITATE and further consideration for palliative care/engagement of hospice services.  The patient's oral intake has been poor and he is on several antipsychotics that are increasing his underlying risk of arrhythmia and cardiovascular complications.  Psychiatry consultant has spoken with the  patient's legal guardian who will escalate the case to DSS supervisors.  If any information is needed from the medical team please reach out to us .    Hypothermia Borderline SIRS Temperature 95.5 in ED on admission Meeting  borderline SIRS criteria with hypothermia as well as respirations at 20 White count within normal limits Procalcitonin negative.  Lactate normal. No clinical evidence of infection No evidence of sepsis.  Ruled out Plan: Continue holding antibiotics.  Monitor vitals and fever curve.  Low threshold to start antimicrobial therapy with any signs of evolving infection   Heart failure with preserved ejection fraction (HFpEF)  2D echo May 2024 with EF of 50 to 55% and grade 1 diastolic dysfunction Appears euvolemic.  Continue diuretic and antihypertensive regimen       Complete heart block (HCC) Status post pacemaker   CKD (chronic kidney disease) Creatinine 1.5 today with GFR in the 50s Appears near baseline Monitor   GERD (gastroesophageal reflux disease) PPI   Essential hypertension Systolic pressures 90s on presentation with overlapping hypothermia Antihypertensive and diuretic restarted   Mixed hyperlipidemia CK normal.  Continue Lipitor   Acquired hypothyroidism Continue Synthroid  TSH normal   DVT prophylaxis: SQ Lovenox  Code Status: Full Family Communication: None Disposition Plan: Status is: Inpatient Remains inpatient appropriate because: Unsafe discharge plan.  Patient is medically appropriate for discharge at this time.    Level of care: Telemetry Medical  Consultants:  Psychiatry  Procedures:  None  Antimicrobials: None   Subjective: Seen and examined.  Lethargic.  Verbally unresponsive to interview questions. Objective: Vitals:   11/26/23 1623 11/26/23 2216 11/27/23 0434 11/27/23 0806  BP: (!) 114/55 (!) 102/51 (!) 102/57 (!) 107/50  Pulse: 63 63 71 68  Resp: 14 18 18 18   Temp: 98.1 F (36.7 C) 98 F (36.7 C)  97.6 F (36.4 C)  TempSrc:  Axillary  Axillary  SpO2: 97% 96% (P) 95% 92%  Weight:      Height:        Intake/Output Summary (Last 24 hours) at 11/27/2023 1245 Last data filed at 11/27/2023 0809 Gross per 24 hour  Intake --   Output 200 ml  Net -200 ml   Filed Weights   08/23/23 2222  Weight: 97.5 kg    Examination:  General exam: Chronically ill-appearing.  Fatigued.  Disheveled. Respiratory system: Clear.  Normal work of breathing.  Room air Cardiovascular system: S1-S2, RRR, no murmurs, trace pedal edema Gastrointestinal system: Soft, NT/ND, normal bowel sounds Central nervous system: Awake.  Lethargic.  Unable to assess orientation Extremities: Symmetric 5 x 5 power.  Gait not assessed Skin: No rashes, lesions or ulcers Psychiatry: Unable to assess    Data Reviewed: I have personally reviewed following labs and imaging studies  CBC: Recent Labs  Lab 11/23/23 0550 11/24/23 0806 11/27/23 1013  WBC 5.1 4.4 12.8*  NEUTROABS 1.8  --  7.3  HGB 14.5 13.2 15.1  HCT 45.2 39.2 45.5  MCV 84.3 81.0 80.8  PLT 100* 84* 97*   Basic Metabolic Panel: Recent Labs  Lab 11/23/23 0550 11/24/23 0806 11/27/23 1013  NA 135 141 142  K 3.6 3.9 3.6  CL 100 111 110  CO2 25 24 20*  GLUCOSE 95 89 123*  BUN 34* 26* 40*  CREATININE 1.53* 1.30* 1.62*  CALCIUM  9.1 8.9 8.9   GFR: Estimated Creatinine Clearance: 38.9 mL/min (A) (by C-G formula based on SCr of 1.62 mg/dL (H)). Liver Function Tests: Recent Labs  Lab 11/23/23 0550 11/24/23 1610  AST 44* 35  ALT 30 26  ALKPHOS 87 69  BILITOT 0.8 0.9  PROT 6.2* 5.4*  ALBUMIN 3.6 3.0*   No results for input(s): "LIPASE", "AMYLASE" in the last 168 hours. Recent Labs  Lab 11/23/23 0911  AMMONIA <13   Coagulation Profile: Recent Labs  Lab 11/23/23 0945  INR 1.1   Cardiac Enzymes: Recent Labs  Lab 11/23/23 0911  CKTOTAL 21*   BNP (last 3 results) No results for input(s): "PROBNP" in the last 8760 hours. HbA1C: No results for input(s): "HGBA1C" in the last 72 hours. CBG: Recent Labs  Lab 11/23/23 0554  GLUCAP 96   Lipid Profile: No results for input(s): "CHOL", "HDL", "LDLCALC", "TRIG", "CHOLHDL", "LDLDIRECT" in the last 72  hours. Thyroid  Function Tests: No results for input(s): "TSH", "T4TOTAL", "FREET4", "T3FREE", "THYROIDAB" in the last 72 hours.  Anemia Panel: No results for input(s): "VITAMINB12", "FOLATE", "FERRITIN", "TIBC", "IRON", "RETICCTPCT" in the last 72 hours. Sepsis Labs: Recent Labs  Lab 11/23/23 0550 11/23/23 1055  PROCALCITON  --  <0.10  LATICACIDVEN 1.3  --     Recent Results (from the past 240 hours)  Culture, blood (routine x 2)     Status: None (Preliminary result)   Collection Time: 11/23/23  5:50 AM   Specimen: BLOOD  Result Value Ref Range Status   Specimen Description BLOOD RIGHT ARM  Final   Special Requests   Final    BOTTLES DRAWN AEROBIC AND ANAEROBIC Blood Culture results may not be optimal due to an inadequate volume of blood received in culture bottles   Culture   Final    NO GROWTH 4 DAYS Performed at [facility], [address], [place], [place] [address]    Report Status PENDING  Incomplete  Culture, blood (routine x 2)     Status: None (Preliminary result)   Collection Time: 11/23/23  5:50 AM   Specimen: BLOOD  Result Value Ref Range Status   Specimen Description BLOOD LEFT ARM  Final   Special Requests   Final    BOTTLES DRAWN AEROBIC AND ANAEROBIC Blood Culture results may not be optimal due to an inadequate volume of blood received in culture bottles   Culture   Final    NO GROWTH 4 DAYS Performed at [facility], [address], [place], [place] [address]    Report Status PENDING  Incomplete  Resp panel by RT-PCR (RSV, Flu A&B, Covid) Anterior Nasal Swab     Status: None   Collection Time: 11/23/23  5:50 AM   Specimen: Anterior Nasal Swab  Result Value Ref Range Status   SARS Coronavirus 2 by RT PCR NEGATIVE NEGATIVE Final    Comment: (NOTE) SARS-CoV-2 target nucleic acids are NOT DETECTED.  The SARS-CoV-2 RNA is generally detectable in upper respiratory specimens during the acute phase of infection. The lowest concentration  of SARS-CoV-2 viral copies this assay can detect is 138 copies/mL. A negative result does not preclude SARS-Cov-2 infection and should not be used as the sole basis for treatment or other patient management decisions. A negative result may occur with  improper specimen collection/handling, submission of specimen other than nasopharyngeal swab, presence of viral mutation(s) within the areas targeted by this assay, and inadequate number of viral copies(<138 copies/mL). A negative result must be combined with clinical observations, patient history, and epidemiological information. The expected result is Negative.  Fact Sheet for Patients:  BloggerCourse.com  Fact Sheet for Healthcare Providers:  SeriousBroker.it  This test is no  t yet approved or cleared by the [place]  FDA and  has been authorized for detection and/or diagnosis of SARS-CoV-2 by FDA under an Emergency Use Authorization (EUA). This EUA will remain  in effect (meaning this test can be used) for the duration of the COVID-19 declaration under Section 564(b)(1) of the Act, 21 U.S.C.section 360bbb-3(b)(1), unless the authorization is terminated  or revoked sooner.       Influenza A by PCR NEGATIVE NEGATIVE Final   Influenza B by PCR NEGATIVE NEGATIVE Final    Comment: (NOTE) The Xpert Xpress SARS-CoV-2/FLU/RSV plus assay is intended as an aid in the diagnosis of influenza from Nasopharyngeal swab specimens and should not be used as a sole basis for treatment. Nasal washings and aspirates are unacceptable for Xpert Xpress SARS-CoV-2/FLU/RSV testing.  Fact Sheet for Patients: BloggerCourse.com  Fact Sheet for Healthcare Providers: SeriousBroker.it  This test is not yet approved or cleared by the [place]  FDA and has been authorized for detection and/or diagnosis of SARS-CoV-2 by FDA under an Emergency Use  Authorization (EUA). This EUA will remain in effect (meaning this test can be used) for the duration of the COVID-19 declaration under Section 564(b)(1) of the Act, 21 U.S.C. section 360bbb-3(b)(1), unless the authorization is terminated or revoked.     Resp Syncytial Virus by PCR NEGATIVE NEGATIVE Final    Comment: (NOTE) Fact Sheet for Patients: BloggerCourse.com  Fact Sheet for Healthcare Providers: SeriousBroker.it  This test is not yet approved or cleared by the [place]  FDA and has been authorized for detection and/or diagnosis of SARS-CoV-2 by FDA under an Emergency Use Authorization (EUA). This EUA will remain in effect (meaning this test can be used) for the duration of the COVID-19 declaration under Section 564(b)(1) of the Act, 21 U.S.C. section 360bbb-3(b)(1), unless the authorization is terminated or revoked.  Performed at [facility], [address] [place], [place] [address]   Urine Culture     Status: Abnormal   Collection Time: 11/23/23  6:50 AM   Specimen: Urine, Random  Result Value Ref Range Status   Specimen Description   Final    URINE, RANDOM Performed at [facility], [address] [place], [place] [address]    Special Requests   Final    NONE Reflexed from [id number] Performed at [facility], [address] [place], [place] [address]    Culture 80,000 COLONIES/mL STAPHYLOCOCCUS HAEMOLYTICUS (A)  Final   Report Status 11/25/2023 FINAL  Final   Organism ID, Bacteria STAPHYLOCOCCUS HAEMOLYTICUS (A)  Final      Susceptibility   Staphylococcus haemolyticus - MIC*    CIPROFLOXACIN  >=8 RESISTANT Resistant     GENTAMICIN  8 INTERMEDIATE Intermediate     NITROFURANTOIN 64 INTERMEDIATE Intermediate     OXACILLIN >=4 RESISTANT Resistant     TETRACYCLINE <=1 SENSITIVE Sensitive     VANCOMYCIN  1 SENSITIVE Sensitive     TRIMETH/SULFA <=10 SENSITIVE Sensitive     RIFAMPIN  <=0.5 SENSITIVE Sensitive     Inducible Clindamycin POSITIVE Resistant     * 80,000 COLONIES/mL STAPHYLOCOCCUS HAEMOLYTICUS  MRSA Next Gen by PCR, Nasal     Status: None   Collection Time: 11/23/23  1:56 PM   Specimen: Nasal Mucosa; Nasal Swab  Result Value Ref Range Status   MRSA by PCR Next Gen NOT DETECTED NOT DETECTED Final    Comment: (NOTE) The GeneXpert MRSA Assay (FDA approved for NASAL specimens only), is one component of a comprehensive MRSA colonization surveillance  program. It is not intended to diagnose MRSA infection nor to guide or monitor treatment for MRSA infections. Test performance is not FDA approved in patients less than 20 years old. Performed at [facility] [address] [place], [place] [address]          Radiology Studies: No results found.       Scheduled Meds:  amLODipine   2.5 mg Oral Daily   aspirin   81 mg Oral Daily   atorvastatin   20 mg Oral QHS   clonazepam   1 mg Oral BID   cyanocobalamin   1,000 mcg Intramuscular Q30 days   enoxaparin  (LOVENOX ) injection  40 mg Subcutaneous Q24H   ferrous sulfate   325 mg Oral Q breakfast   furosemide   40 mg Oral Daily   levothyroxine   75 mcg Oral q morning   losartan   50 mg Oral Daily   OLANZapine   5 mg Oral TID   pantoprazole   40 mg Oral Daily   polyethylene glycol  17 g Oral Daily   senna-docusate  1 tablet Oral BID   sertraline   150 mg Oral Daily   Continuous Infusions:   LOS: 4 days     Tiajuana Fluke, MD [facility]   If 7PM-7AM, please contact night-coverage  11/27/2023, 12:45 PM

## 2023-11-27 NOTE — Plan of Care (Signed)
 Pt still confused, Pt eats small bites when fed.  Pt likes magic cup ice cream.   Fully dependent.  Pt has intervals of yelling and aggressive behavior.  PRN meds used when needed.   Goals of care should be addressed.   Problem: Education: Goal: Knowledge of General Education information will improve Description: Including pain rating scale, medication(s)/side effects and non-pharmacologic comfort measures Outcome: Not Progressing   Problem: Health Behavior/Discharge Planning: Goal: Ability to manage health-related needs will improve Outcome: Not Progressing   Problem: Clinical Measurements: Goal: Ability to maintain clinical measurements within normal limits will improve Outcome: Not Progressing Goal: Will remain free from infection Outcome: Not Progressing Goal: Diagnostic test results will improve Outcome: Not Progressing Goal: Respiratory complications will improve Outcome: Not Progressing Goal: Cardiovascular complication will be avoided Outcome: Not Progressing   Problem: Activity: Goal: Risk for activity intolerance will decrease Outcome: Not Progressing   Problem: Nutrition: Goal: Adequate nutrition will be maintained Outcome: Not Progressing   Problem: Coping: Goal: Level of anxiety will decrease Outcome: Not Progressing   Problem: Elimination: Goal: Will not experience complications related to bowel motility Outcome: Not Progressing Goal: Will not experience complications related to urinary retention Outcome: Not Progressing   Problem: Pain Managment: Goal: General experience of comfort will improve and/or be controlled Outcome: Not Progressing   Problem: Safety: Goal: Ability to remain free from injury will improve Outcome: Not Progressing   Problem: Skin Integrity: Goal: Risk for impaired skin integrity will decrease Outcome: Not Progressing   Problem: Fluid Volume: Goal: Hemodynamic stability will improve Outcome: Not Progressing   Problem:  Clinical Measurements: Goal: Diagnostic test results will improve Outcome: Not Progressing Goal: Signs and symptoms of infection will decrease Outcome: Not Progressing   Problem: Respiratory: Goal: Ability to maintain adequate ventilation will improve Outcome: Not Progressing

## 2023-11-27 NOTE — Plan of Care (Signed)
   Problem: Education: Goal: Knowledge of General Education information will improve Description: Including pain rating scale, medication(s)/side effects and non-pharmacologic comfort measures Outcome: Not Progressing

## 2023-11-27 NOTE — Consult Note (Signed)
 [facility] Psychiatric Consult Follow up  Patient Name: .Hunter [name]  MRN: [id number]  DOB: [birth year]  Consult Order details:  Orders (From admission, onward)     Start     Ordered   11/19/23 1501  IP CONSULT TO PSYCHIATRY       Ordering Provider: Lind Repine, MD  Provider:  (Not yet assigned)  Question Answer Comment  Place call to: Psych NP   Reason for Consult Consult      11/19/23 1500   10/15/23 1359  IP CONSULT TO PSYCHIATRY       Ordering Provider: Viviano Ground, MD  Provider:  (Not yet assigned)  Question Answer Comment  Consult Timeframe URGENT - requires response within 12 hours   URGENT timeframe requires provider to provider communication, has the provider to provider communication been completed Yes   Reason for Consult? Consult for medication management   Contact phone number where the requesting provider can be reached [phone]      10/15/23 1358   10/09/23 0238  IP CONSULT TO PSYCHIATRY       Ordering Provider: Dewitt Forehand, DO  Provider:  (Not yet assigned)  Question Answer Comment  Consult Timeframe URGENT - requires response within 12 hours   URGENT timeframe requires provider to provider communication, has the provider to provider communication been completed Yes   Reason for Consult? Consult for medication management due to increasing agitation, not improving withy last medication adjustments 10/04/23   Contact phone number where the requesting provider can be reached [phone]      10/09/23 0238   10/08/23 1128  IP CONSULT TO PSYCHIATRY       Comments: Pt depakote  level being checked and psych needs to review/manage meds as agitation has continued.  Ordering Provider: Shane Darling, MD  Provider:  (Not yet assigned)  Question Answer Comment  Place call to: psych   Reason for Consult Other (See Comments) Medication mgmt  Diagnosis/Clinical Info for Consult: continued aggressive behavior      10/08/23 1132   08/24/23 0045  IP CONSULT TO  PSYCHIATRY       Ordering Provider: Norlene Beavers, MD  Provider:  (Not yet assigned)  Question Answer Comment  Place call to: psychiatry   Reason for Consult Consult   Diagnosis/Clinical Info for Consult: aggressive behavior      08/24/23 0044             Mode of Visit: In person    Psychiatry Consult Evaluation  Service Date: Nov 27, 2023 LOS:  LOS: 4 days  Chief Complaint agitation  Primary Psychiatric Diagnoses  Dementia with behavioral disturbance    Assessment  Hunter [name] is a 89 y.o. male admitted: Medicallyfor 08/24/2023 12:14 AM with hx of dementia brought in to ED for agitation form a facility. Patient has been in ED waiting for placement. Psychiatry is requested to get involved again as patient's agitation has been getting worse.  Sensory Deprivation: Legal guardian wants to bring patient's hearing aids and this provider recommended to bring in hearing aids as sensory deprivation can make confusion worse. EKG 11/19/23- QTC 539- discontinued PRN zyprexa  and increased ativan  2 mg Q6 hr PRN.  Discussed extensively with the legal guardian about the risk benefits of the current treatment in the context of patient's uncontrollable agitation and QTc prolongation and EKG.  Also discussed to consider DNR and palliative care given the poor quality of life and all the complications if medicated. Legal guardian  expressed her understanding and gave consent for the medication adjustments.   11/27/23: Patient is noted to be resting in bed heavily sedated.  Given his sensory deprivation, well-appearing he did not respond to any verbal command and did not respond to any commands by touch.  Per nursing patient is given the medications through ice cream or pudding.  Patient has been resting and unable to participate in an interview at this time.  Diagnoses:  Active Hospital problems: Principal Problem:   Aggressive behavior due to dementia [facility]) Active Problems:   Acquired  hypothyroidism   Mixed hyperlipidemia   Essential hypertension   GERD (gastroesophageal reflux disease)   CKD (chronic kidney disease)   Heart failure with preserved ejection fraction (HFpEF)    Complete heart block (HCC)   Dementia (HCC)   Physically aggressive behavior   Hypothermia   Pressure injury of skin    Plan   ## Psychiatric Medication Recommendations:  Klonopin  1 mg BID  Zyprexa  5 mg TID ( minimize the use of PRN geodon  as it has more impact on Qtc) Zoloft  150 mg daily  ## Medical Decision Making Capacity: Not specifically addressed in this encounter  ## Further Work-up:  -- Depakote  levels every morning 11/23/23-79  -- most recent EKG on 11/24/2023 had QtC of 514 -- Pertinent labwork reviewed earlier this admission includes: CMP, ammonia levels   ## Disposition:--Recommend nursing home after better control of behavioral disturbance  ## Behavioral / Environmental: -Delirium Precautions: Delirium Interventions for Nursing and Staff: - RN to open blinds every AM. - To Bedside: Glasses, hearing aide, and pt's own shoes. Make available to patients. when possible and encourage use. - Encourage po fluids when appropriate, keep fluids within reach. - OOB to chair with meals. - Passive ROM exercises to all extremities with AM & PM care. - RN to assess orientation to person, time and place QAM and PRN. - Recommend extended visitation hours with familiar family/friends as feasible. - Staff to minimize disturbances at night. Turn off television when pt asleep or when not in use.    ## Safety and Observation Level:  - Based on my clinical evaluation, I estimate the patient to be at low risk of self harm in the current setting. - At this time, we recommend  routine. This decision is based on my review of the chart including patient's history and current presentation, interview of the patient, mental status examination, and consideration of suicide risk including evaluating suicidal  ideation, plan, intent, suicidal or self-harm behaviors, risk factors, and protective factors. This judgment is based on our ability to directly address suicide risk, implement suicide prevention strategies, and develop a safety plan while the patient is in the clinical setting. Please contact our team if there is a concern that risk level has changed.  CSSR Risk Category:C-SSRS RISK CATEGORY: No Risk  Suicide Risk Assessment: Unable to assess at this point as patient has hard of hearing and has no hearing aids, patient completely confused and unable to engage in a meaningful interview  Thank you for this consult request. Recommendations have been communicated to the primary team.  We will continue to follow-up at this time.   Jalei Shibley, MD       History of Present Illness  Hunter [name] is a 89 y.o. male admitted: Medicallyfor 08/24/2023 12:14 AM with hx of dementia brought in to ED for agitation form a facility. Patient has been in ED waiting for placement. Psychiatry is requested to get involved  again as patient's agitation has been getting worse.  11/27/23 patient is noted to be sedated and resting in his bed.  Per nursing patient is receiving his medications through ice cream or pudding.  He continues to be confused and illogical when he is awake and aggressive.  When he takes the medication he is calm and sleeping.    Psychiatric and Social History  Psychiatric History: Unable to obtain as there is no family member available, DSS legal guardian is new to the patient as patient is extremely confused  Social History:  Unable to obtain  Substance History Unable to obtain  Exam Findings  Physical Exam: Reviewed and agree with the physical exam findings conducted by the ED provider. Vital Signs:  Temp:  [97.6 F (36.4 C)-98 F (36.7 C)] 97.8 F (36.6 C) (05/17 1541) Pulse Rate:  [60-71] 63 (05/17 1541) Resp:  [18] 18 (05/17 1541) BP: (102-117)/(41-62) 117/62 (05/17  1541) SpO2:  [92 %-100 %] 100 % (05/17 1541) Blood pressure 117/62, pulse 63, temperature 97.8 F (36.6 C), temperature source Axillary, resp. rate 18, height 6' (1.829 m), weight 97.5 kg, SpO2 100%. Body mass index is 29.16 kg/m.    Mental Status Exam: General Appearance: Disheveled  Orientation:  Negative  Memory:  Negative  Concentration:  pt sedated  Recall:  Negative  Attention  Other: pt hard of hearing  Eye Contact:  pt sedated  Speech:  pt sedated  Language:  pt sedated  Volume:  pt sedated  Mood: unable to assess  Affect:  Labile  Thought Process:  pt sedated  Thought Content:  pt sedated  Suicidal Thoughts:  unable to assess  Homicidal Thoughts:  unable to assess  Judgement:  Impaired  Insight:  Lacking  Psychomotor Activity:  pt sedated  Akathisia:  No  Fund of Knowledge:  Negative      Assets:  Resilience  Cognition:  Impaired,  Moderate  ADL's:  Impaired  AIMS (if indicated):        Other History   These have been pulled in through the EMR, reviewed, and updated if appropriate.  Family History:  The patient's family history includes Arthritis in an other family member; Hyperlipidemia in an other family member; Hypertension in an other family member; Lung cancer in his mother; Prostate cancer in an other family member.  Medical History: Past Medical History:  Diagnosis Date   Allergic rhinitis 02/02/2007   Anemia    Arthritis    "knees" (02/08/2017)   Chronic lower back pain    CKD (chronic kidney disease), stage III (HCC) 05/27/2011   Complete heart block 02/08/2017   Eczema    Essential hypertension 02/02/2007   Lasix  60 mg, losartan  50mg ,  Amlodipine  5mg --> 2.5 mg.  Usually have to repeat BP measurements  In the past-Maxzide  37.5-25mg .   GERD (gastroesophageal reflux disease) occasional   Hearing loss of both ears    Heart failure with preserved ejection fraction (HFpEF) 12/06/2013   History of skin cancer 03/26/2014   nose    Hx of  echocardiogram    Echo (07/2013): Mild LVH, EF 55-60%, grade 1 diastolic dysfunction, mild LAE, PASP 23   Hyperlipidemia    Hypertension    Hypothyroidism    LBBB (left bundle branch block)    Left patella fracture 2018   "no OR" (02/08/2017)   Major depression in partial remission 02/02/2007   Amitriptyline  25mg  for sleep (stop when runs out of #10 04/2018(, zoloft  100-->150mg --> 200mg    Mild neurocognitive  disorder 06/21/2019   Neuropathy (HCC) 11/02/2011   Nocturia    Peripheral vascular disease (HCC) LOWER EXTREMITIES   Spinal stenosis in cervical region 03/21/2010   Squamous cell skin cancer, penis: glans (HCC) 05/2010   Initial excision 11/11; recurrence, excision and laser Rx 9/13   Thrombocytopenia (HCC) 05/23/2012   Urinary frequency 09/05/2009   Possible BPH- see Trudi Fus notes    Vitamin D  deficiency 08/15/2008    Surgical History: Past Surgical History:  Procedure Laterality Date   CIRCUMCISION/ LASER DISSECTION PENILE GLANS CANCER  06-02-2010   CYSTOSCOPY WITH URETHRAL DILATATION  03/28/2012   Procedure: CYSTOSCOPY WITH URETHRAL DILATATION;  Surgeon: Edmund Gouge, MD;  Location: [facility];  Service: Urology;  Laterality: N/A;  excision biopsy extensive meatal penile carcinoma meatoplasty   EXCISION RIGHT WRIST BENIGN TUMOR  2002 (APPROX)   hip surgery     INCISION AND DRAINAGE DEEP NECK ABSCESS     INGUINAL HERNIA REPAIR  1970's   KNEE ARTHROSCOPY Right 2017   LUMBAR LAMINECTOMY/DECOMPRESSION MICRODISCECTOMY N/A 05/24/2020   Procedure: OPEN LAMINECTOMY LUMBAR THREE-LUMBAR FOUR;  Surgeon: Pincus Bridgeman, DO;  Location: [facility] OR;  Service: Neurosurgery;  Laterality: N/A;   PACEMAKER IMPLANT N/A 02/08/2017   Procedure: Pacemaker Implant;  Surgeon: Verona Goodwill, MD;  Location: [facility] INVASIVE CV LAB;  Service: Cardiovascular;  Laterality: N/A;   PACEMAKER IMPLANT  02/08/2017   SJM Assurity MRI dual chamber PPM implanted by Dr Rodolfo Clan for complete heart block    PENILE BX  05-09-2010   TONSILLECTOMY     TRANSURETHRAL RESECTION OF PROSTATE N/A 05/10/2015   Procedure: CYSTOSCOPY, URETHRAL MEATAL DILATION, TRANSURETHRAL RESECTION OF THE PROSTATE (TURP);  Surgeon: Annamarie Kid, MD;  Location: [facility];  Service: Urology;  Laterality: N/A;     Medications:   Current Facility-Administered Medications:    acetaminophen  (TYLENOL ) tablet 650 mg, 650 mg, Oral, Q6H PRN, Corrinne Din, MD   albuterol  (PROVENTIL ) (2.5 MG/3ML) 0.083% nebulizer solution 2.5 mg, 2.5 mg, Inhalation, Q8H PRN, Buell Carmin, MD   amLODipine  (NORVASC ) tablet 2.5 mg, 2.5 mg, Oral, Daily, Buell Carmin, MD, 2.5 mg at 11/27/23 1115   aspirin  chewable tablet 81 mg, 81 mg, Oral, Daily, Buell Carmin, MD, 81 mg at 11/27/23 1116   atorvastatin  (LIPITOR) tablet 20 mg, 20 mg, Oral, QHS, Buell Carmin, MD, 20 mg at 11/22/23 2106   bisacodyl  (DULCOLAX) suppository 10 mg, 10 mg, Rectal, Daily PRN, Mosetta Areola, Sudheer B, MD, 10 mg at 11/25/23 1201   clonazePAM  (KLONOPIN ) disintegrating tablet 1 mg, 1 mg, Oral, BID, Ayleah Hofmeister, MD, 1 mg at 11/27/23 1117   cyanocobalamin  (VITAMIN B12) injection 1,000 mcg, 1,000 mcg, Intramuscular, Q30 days, Buell Carmin, MD, 1,000 mcg at 11/21/23 1018   enoxaparin  (LOVENOX ) injection 40 mg, 40 mg, Subcutaneous, Q24H, Corrinne Din, MD, 40 mg at 11/27/23 1114   ferrous sulfate  tablet 325 mg, 325 mg, Oral, Q breakfast, Buell Carmin, MD, 325 mg at 11/27/23 1116   furosemide  (LASIX ) tablet 40 mg, 40 mg, Oral, Daily, Sreenath, Sudheer B, MD, 40 mg at 11/27/23 1116   levothyroxine  (SYNTHROID ) tablet 75 mcg, 75 mcg, Oral, q morning, Buell Carmin, MD, 75 mcg at 11/27/23 1114   LORazepam  (ATIVAN ) injection 1 mg, 1 mg, Intravenous, PRN, Byungura, Veronique M, NP, 1 mg at 11/27/23 1214   LORazepam  (ATIVAN ) tablet 2 mg, 2 mg, Oral, Q6H PRN, 2 mg at 11/22/23 1652 **OR** LORazepam  (ATIVAN ) injection 2 mg, 2 mg, Intramuscular, Q6H PRN, [name], MD,  2 mg at 11/24/23 1223    losartan  (COZAAR ) tablet 50 mg, 50 mg, Oral, Daily, Buell Carmin, MD, 50 mg at 11/27/23 1116   OLANZapine  (ZYPREXA ) tablet 5 mg, 5 mg, Oral, TID, Imri Lor, MD, 5 mg at 11/27/23 1650   ondansetron  (ZOFRAN ) tablet 4 mg, 4 mg, Oral, Q6H PRN **OR** ondansetron  (ZOFRAN ) injection 4 mg, 4 mg, Intravenous, Q6H PRN, Corrinne Din, MD   pantoprazole  (PROTONIX ) EC tablet 40 mg, 40 mg, Oral, Daily, Buell Carmin, MD, 40 mg at 11/27/23 1116   polyethylene glycol (MIRALAX  / GLYCOLAX ) packet 17 g, 17 g, Oral, Daily, Sreenath, Sudheer B, MD, 17 g at 11/27/23 1124   senna-docusate (Senokot-S) tablet 1 tablet, 1 tablet, Oral, BID, Sreenath, Sudheer B, MD, 1 tablet at 11/27/23 1116   sertraline  (ZOLOFT ) tablet 150 mg, 150 mg, Oral, Daily, Lee, Jacqueline Eun, NP, 150 mg at 11/27/23 1117   ziprasidone  (GEODON ) injection 10 mg, 10 mg, Intramuscular, Q6H PRN, Margery Sheets B, MD, 10 mg at 11/27/23 1748  Allergies: No Known Allergies  Michaiah Maiden, MD

## 2023-11-28 DIAGNOSIS — F03918 Unspecified dementia, unspecified severity, with other behavioral disturbance: Secondary | ICD-10-CM | POA: Diagnosis not present

## 2023-11-28 LAB — CULTURE, BLOOD (ROUTINE X 2)
Culture: NO GROWTH
Culture: NO GROWTH

## 2023-11-28 MED ORDER — LORAZEPAM 2 MG PO TABS
2.0000 mg | ORAL_TABLET | Freq: Four times a day (QID) | ORAL | Status: DC | PRN
  Filled 2023-11-28: qty 1

## 2023-11-28 MED ORDER — LORAZEPAM 2 MG/ML IJ SOLN
2.0000 mg | Freq: Four times a day (QID) | INTRAMUSCULAR | Status: DC | PRN
  Administered 2023-11-28 – 2023-12-02 (×6): 2 mg via INTRAMUSCULAR
  Filled 2023-11-28 (×6): qty 1

## 2023-11-28 NOTE — Progress Notes (Signed)
 Pt is less combative at this time. Able to get vital signs utilizing x3 staff. Pt attempting to bite staff x2. Dr Mosetta Areola gave VO to check vital signs q shift at this time. Pt able to state his first name and that his birthday was [birth year]. Otherwise pt is completely disoriented. Pt asking to "Let the window down" and states "I need to go dig a well". Attempted to reorient patient, pt responded with "let me go god dammit".

## 2023-11-28 NOTE — Progress Notes (Signed)
 Attempted to give patient PO zyprexa . Pt spit pill at this RN. Crushed pill and attempted to give in magic cup. Pt again spit mouthful at this RN. Pt continuously trying to hit and punch this RN and other staff in the room to do a full bed change and provide hygiene care. Staff x5 were able to change bed, however pt continues to try to kick, hit, bite and spit at staff. Pt given PRN IM ativan  with no effect. Pt continues to state "God dammed bastards" and attempt to hit staff when attempting to care for patient.

## 2023-11-28 NOTE — Progress Notes (Signed)
 Patient's IV no longer working. Dr Mosetta Areola notified and stated it was OK to leave out at this time.

## 2023-11-28 NOTE — Consult Note (Signed)
 [facility] Psychiatric Consult Follow up  Patient Name: .Hunter [name]  MRN: [id number]  DOB: 24-Apr-1934  Consult Order details:  Orders (From admission, onward)     Start     Ordered   11/19/23 1501  IP CONSULT TO PSYCHIATRY       Ordering Provider: Lind Repine, MD  Provider:  (Not yet assigned)  Question Answer Comment  Place call to: Psych NP   Reason for Consult Consult      11/19/23 1500   10/15/23 1359  IP CONSULT TO PSYCHIATRY       Ordering Provider: Viviano Ground, MD  Provider:  (Not yet assigned)  Question Answer Comment  Consult Timeframe URGENT - requires response within 12 hours   URGENT timeframe requires provider to provider communication, has the provider to provider communication been completed Yes   Reason for Consult? Consult for medication management   Contact phone number where the requesting provider can be reached [phone]      10/15/23 1358   10/09/23 0238  IP CONSULT TO PSYCHIATRY       Ordering Provider: Dewitt Forehand, DO  Provider:  (Not yet assigned)  Question Answer Comment  Consult Timeframe URGENT - requires response within 12 hours   URGENT timeframe requires provider to provider communication, has the provider to provider communication been completed Yes   Reason for Consult? Consult for medication management due to increasing agitation, not improving withy last medication adjustments 10/04/23   Contact phone number where the requesting provider can be reached [phone]      10/09/23 0238   10/08/23 1128  IP CONSULT TO PSYCHIATRY       Comments: Pt depakote  level being checked and psych needs to review/manage meds as agitation has continued.  Ordering Provider: Shane Darling, MD  Provider:  (Not yet assigned)  Question Answer Comment  Place call to: psych   Reason for Consult Other (See Comments) Medication mgmt  Diagnosis/Clinical Info for Consult: continued aggressive behavior      10/08/23 1132   08/24/23 0045  IP CONSULT TO  PSYCHIATRY       Ordering Provider: Norlene Beavers, MD  Provider:  (Not yet assigned)  Question Answer Comment  Place call to: psychiatry   Reason for Consult Consult   Diagnosis/Clinical Info for Consult: aggressive behavior      08/24/23 0044             Mode of Visit: In person    Psychiatry Consult Evaluation  Service Date: Nov 28, 2023 LOS:  LOS: 5 days  Chief Complaint agitation  Primary Psychiatric Diagnoses  Dementia with behavioral disturbance    Assessment  Hunter [name] is a 89 y.o. male admitted: Medicallyfor 08/24/2023 12:14 AM with hx of dementia brought in to ED for agitation form a facility. Patient has been in ED waiting for placement. Psychiatry is requested to get involved again as patient's agitation has been getting worse.  Sensory Deprivation: Legal guardian wants to bring patient's hearing aids and this provider recommended to bring in hearing aids as sensory deprivation can make confusion worse. EKG 11/19/23- QTC 539- discontinued PRN zyprexa  and increased ativan  2 mg Q6 hr PRN.  Discussed extensively with the legal guardian about the risk benefits of the current treatment in the context of patient's uncontrollable agitation and QTc prolongation and EKG.  Also discussed to consider DNR and palliative care given the poor quality of life and all the complications if medicated. Legal guardian  expressed her understanding and gave consent for the medication adjustments.   11/28/2023.  Patient continues to display intermittent agitation when he spits out the medication but on the days when he swallows the medication he responds very well but with sedation and resting.  Unfortunately the goal of treatment is mainly to keep him safe and the staff safe.  Given the worsening terminal dementia we do not expect any major changes in his mental status.  Will continue to discuss DNR/palliative care with the guardian.  Diagnoses:  Active Hospital problems: Principal  Problem:   Aggressive behavior due to dementia [facility]) Active Problems:   Acquired hypothyroidism   Mixed hyperlipidemia   Essential hypertension   GERD (gastroesophageal reflux disease)   CKD (chronic kidney disease)   Heart failure with preserved ejection fraction (HFpEF)    Complete heart block (HCC)   Dementia (HCC)   Physically aggressive behavior   Hypothermia   Pressure injury of skin    Plan   ## Psychiatric Medication Recommendations:  Klonopin  1 mg BID  Zyprexa  5 mg TID ( minimize the use of PRN geodon  as it has more impact on Qtc) Zoloft  150 mg daily  ## Medical Decision Making Capacity: Not specifically addressed in this encounter  ## Further Work-up:  -- Depakote  levels every morning 11/23/23-79  -- most recent EKG on 11/24/2023 had QtC of 514 -- Pertinent labwork reviewed earlier this admission includes: CMP, ammonia levels   ## Disposition:--Recommend nursing home after better control of behavioral disturbance  ## Behavioral / Environmental: -Delirium Precautions: Delirium Interventions for Nursing and Staff: - RN to open blinds every AM. - To Bedside: Glasses, hearing aide, and pt's own shoes. Make available to patients. when possible and encourage use. - Encourage po fluids when appropriate, keep fluids within reach. - OOB to chair with meals. - Passive ROM exercises to all extremities with AM & PM care. - RN to assess orientation to person, time and place QAM and PRN. - Recommend extended visitation hours with familiar family/friends as feasible. - Staff to minimize disturbances at night. Turn off television when pt asleep or when not in use.    ## Safety and Observation Level:  - Based on my clinical evaluation, I estimate the patient to be at low risk of self harm in the current setting. - At this time, we recommend  routine. This decision is based on my review of the chart including patient's history and current presentation, interview of the patient, mental  status examination, and consideration of suicide risk including evaluating suicidal ideation, plan, intent, suicidal or self-harm behaviors, risk factors, and protective factors. This judgment is based on our ability to directly address suicide risk, implement suicide prevention strategies, and develop a safety plan while the patient is in the clinical setting. Please contact our team if there is a concern that risk level has changed.  CSSR Risk Category:C-SSRS RISK CATEGORY: No Risk  Suicide Risk Assessment: Unable to assess at this point as patient has hard of hearing and has no hearing aids, patient completely confused and unable to engage in a meaningful interview  Thank you for this consult request. Recommendations have been communicated to the primary team.  We will continue to follow-up at this time.   Challen Spainhour, MD       History of Present Illness  Hunter [name] is a 89 y.o. male admitted: Medicallyfor 08/24/2023 12:14 AM with hx of dementia brought in to ED for agitation form a facility. Patient  has been in ED waiting for placement. Psychiatry is requested to get involved again as patient's agitation has been getting worse.  11/28/2023 patient is noted to be sedated with eyes closed but moving his hands to remove the mittens.  He is in no position to engage in any interview given his sensory deprivation of hard of hearing and unable to comprehend even the written questions.    Psychiatric and Social History  Psychiatric History: Unable to obtain as there is no family member available, DSS legal guardian is new to the patient as patient is extremely confused  Social History:  Unable to obtain  Substance History Unable to obtain  Exam Findings  Physical Exam: Reviewed and agree with the physical exam findings conducted by the ED provider. Vital Signs:  Temp:  [97.1 F (36.2 C)-98.2 F (36.8 C)] 98.2 F (36.8 C) (05/18 1940) Pulse Rate:  [65-77] 76 (05/18 1940) Resp:   [16-18] 18 (05/18 1940) BP: (99-115)/(62-102) 99/62 (05/18 1940) SpO2:  [93 %-98 %] 98 % (05/18 1940) Blood pressure 99/62, pulse 76, temperature 98.2 F (36.8 C), resp. rate 18, height 6' (1.829 m), weight 97.5 kg, SpO2 98%. Body mass index is 29.16 kg/m.    Mental Status Exam: General Appearance: Disheveled  Orientation:  Negative  Memory:  Negative  Concentration:  pt sedated  Recall:  Negative  Attention  Other: pt hard of hearing  Eye Contact:  pt sedated  Speech:  pt sedated  Language:  pt sedated  Volume:  pt sedated  Mood: unable to assess  Affect:  Labile  Thought Process:  pt sedated  Thought Content:  pt sedated  Suicidal Thoughts:  unable to assess  Homicidal Thoughts:  unable to assess  Judgement:  Impaired  Insight:  Lacking  Psychomotor Activity:  pt sedated  Akathisia:  No  Fund of Knowledge:  Negative      Assets:  Resilience  Cognition:  Impaired,  Moderate  ADL's:  Impaired  AIMS (if indicated):        Other History   These have been pulled in through the EMR, reviewed, and updated if appropriate.  Family History:  The patient's family history includes Arthritis in an other family member; Hyperlipidemia in an other family member; Hypertension in an other family member; Lung cancer in his mother; Prostate cancer in an other family member.  Medical History: Past Medical History:  Diagnosis Date   Allergic rhinitis 02/02/2007   Anemia    Arthritis    "knees" (02/08/2017)   Chronic lower back pain    CKD (chronic kidney disease), stage III (HCC) 05/27/2011   Complete heart block 02/08/2017   Eczema    Essential hypertension 02/02/2007   Lasix  60 mg, losartan  50mg ,  Amlodipine  5mg --> 2.5 mg.  Usually have to repeat BP measurements  In the past-Maxzide  37.5-25mg .   GERD (gastroesophageal reflux disease) occasional   Hearing loss of both ears    Heart failure with preserved ejection fraction (HFpEF) 12/06/2013   History of skin cancer 03/26/2014    nose    Hx of echocardiogram    Echo (07/2013): Mild LVH, EF 55-60%, grade 1 diastolic dysfunction, mild LAE, PASP 23   Hyperlipidemia    Hypertension    Hypothyroidism    LBBB (left bundle branch block)    Left patella fracture 2018   "no OR" (02/08/2017)   Major depression in partial remission 02/02/2007   Amitriptyline  25mg  for sleep (stop when runs out of #10 04/2018(, zoloft  100-->150mg -->  200mg    Mild neurocognitive disorder 06/21/2019   Neuropathy (HCC) 11/02/2011   Nocturia    Peripheral vascular disease (HCC) LOWER EXTREMITIES   Spinal stenosis in cervical region 03/21/2010   Squamous cell skin cancer, penis: glans (HCC) 05/2010   Initial excision 11/11; recurrence, excision and laser Rx 9/13   Thrombocytopenia (HCC) 05/23/2012   Urinary frequency 09/05/2009   Possible BPH- see Trudi Fus notes    Vitamin D  deficiency 08/15/2008    Surgical History: Past Surgical History:  Procedure Laterality Date   CIRCUMCISION/ LASER DISSECTION PENILE GLANS CANCER  06-02-2010   CYSTOSCOPY WITH URETHRAL DILATATION  03/28/2012   Procedure: CYSTOSCOPY WITH URETHRAL DILATATION;  Surgeon: Edmund Gouge, MD;  Location: [facility] [place];  Service: Urology;  Laterality: N/A;  excision biopsy extensive meatal penile carcinoma meatoplasty   EXCISION RIGHT WRIST BENIGN TUMOR  2002 (APPROX)   hip surgery     INCISION AND DRAINAGE DEEP NECK ABSCESS     INGUINAL HERNIA REPAIR  1970's   KNEE ARTHROSCOPY Right 2017   LUMBAR LAMINECTOMY/DECOMPRESSION MICRODISCECTOMY N/A 05/24/2020   Procedure: OPEN LAMINECTOMY LUMBAR THREE-LUMBAR FOUR;  Surgeon: Pincus Bridgeman, DO;  Location: [facility] OR;  Service: Neurosurgery;  Laterality: N/A;   PACEMAKER IMPLANT N/A 02/08/2017   Procedure: Pacemaker Implant;  Surgeon: Verona Goodwill, MD;  Location: [facility] INVASIVE CV LAB;  Service: Cardiovascular;  Laterality: N/A;   PACEMAKER IMPLANT  02/08/2017   SJM Assurity MRI dual chamber PPM implanted by Dr Rodolfo Clan for complete  heart block   PENILE BX  05-09-2010   TONSILLECTOMY     TRANSURETHRAL RESECTION OF PROSTATE N/A 05/10/2015   Procedure: CYSTOSCOPY, URETHRAL MEATAL DILATION, TRANSURETHRAL RESECTION OF THE PROSTATE (TURP);  Surgeon: Annamarie Kid, MD;  Location: [facility];  Service: Urology;  Laterality: N/A;     Medications:   Current Facility-Administered Medications:    acetaminophen  (TYLENOL ) tablet 650 mg, 650 mg, Oral, Q6H PRN, Corrinne Din, MD   albuterol  (PROVENTIL ) (2.5 MG/3ML) 0.083% nebulizer solution 2.5 mg, 2.5 mg, Inhalation, Q8H PRN, Buell Carmin, MD   amLODipine  (NORVASC ) tablet 2.5 mg, 2.5 mg, Oral, Daily, Buell Carmin, MD, 2.5 mg at 11/28/23 1610   aspirin  chewable tablet 81 mg, 81 mg, Oral, Daily, Buell Carmin, MD, 81 mg at 11/28/23 9604   atorvastatin  (LIPITOR) tablet 20 mg, 20 mg, Oral, QHS, Buell Carmin, MD, 20 mg at 11/22/23 2106   bisacodyl  (DULCOLAX) suppository 10 mg, 10 mg, Rectal, Daily PRN, Mosetta Areola, Sudheer B, MD, 10 mg at 11/25/23 1201   clonazePAM  (KLONOPIN ) disintegrating tablet 1 mg, 1 mg, Oral, BID, Othman Masur, MD, 1 mg at 11/28/23 0815   cyanocobalamin  (VITAMIN B12) injection 1,000 mcg, 1,000 mcg, Intramuscular, Q30 days, Buell Carmin, MD, 1,000 mcg at 11/21/23 1018   enoxaparin  (LOVENOX ) injection 40 mg, 40 mg, Subcutaneous, Q24H, Corrinne Din, MD, 40 mg at 11/28/23 5409   ferrous sulfate  tablet 325 mg, 325 mg, Oral, Q breakfast, Buell Carmin, MD, 325 mg at 11/28/23 8119   furosemide  (LASIX ) tablet 40 mg, 40 mg, Oral, Daily, Sreenath, Sudheer B, MD, 40 mg at 11/28/23 0815   levothyroxine  (SYNTHROID ) tablet 75 mcg, 75 mcg, Oral, q morning, Buell Carmin, MD, 75 mcg at 11/28/23 1478   LORazepam  (ATIVAN ) tablet 2 mg, 2 mg, Oral, Q6H PRN **OR** LORazepam  (ATIVAN ) injection 2 mg, 2 mg, Intramuscular, Q6H PRN, Sreenath, Sudheer B, MD, 2 mg at 11/28/23 1625   losartan  (COZAAR ) tablet 50 mg, 50 mg, Oral, Daily, Buell Carmin, MD,  50 mg at 11/28/23 0816   OLANZapine  (ZYPREXA )  tablet 5 mg, 5 mg, Oral, TID, Chandell Attridge, MD, 5 mg at 11/28/23 0815   ondansetron  (ZOFRAN ) tablet 4 mg, 4 mg, Oral, Q6H PRN **OR** ondansetron  (ZOFRAN ) injection 4 mg, 4 mg, Intravenous, Q6H PRN, Corrinne Din, MD   pantoprazole  (PROTONIX ) EC tablet 40 mg, 40 mg, Oral, Daily, Buell Carmin, MD, 40 mg at 11/28/23 3474   polyethylene glycol (MIRALAX  / GLYCOLAX ) packet 17 g, 17 g, Oral, Daily, Sreenath, Sudheer B, MD, 17 g at 11/27/23 1124   senna-docusate (Senokot-S) tablet 1 tablet, 1 tablet, Oral, BID, Sreenath, Sudheer B, MD, 1 tablet at 11/27/23 1116   sertraline  (ZOLOFT ) tablet 150 mg, 150 mg, Oral, Daily, Lee, Jacqueline Eun, NP, 150 mg at 11/28/23 2595   ziprasidone  (GEODON ) injection 10 mg, 10 mg, Intramuscular, Q6H PRN, Margery Sheets B, MD, 10 mg at 11/27/23 1748  Allergies: No Known Allergies  Artavious Trebilcock, MD

## 2023-11-28 NOTE — Progress Notes (Signed)
 Pt combative with staff, hitting and attempting to bite. Pt refusing to take medication. Attempted to give PO meds crushed in magic cup. Pt spit all his medicine out. Unable to determine how much patient received. Pt refusing to drink water . Pt given PRN ativan  IV.

## 2023-11-28 NOTE — Progress Notes (Signed)
 PROGRESS NOTE    Hunter [name]  WGN:[id number] DOB: 09/09/1933 DOA: 08/24/2023 PCP: Almira Jaeger, MD    Brief Narrative:   89 y.o. male with medical history significant of hypertension, chronic HFpEF, hyperlipidemia, complete bundle branch block status post pacemaker, stage III CKD, dementia currently in ED holding.  Requesting admission for hypothermia as well as lethargy.  Per report, patient has been a longstanding boarding patient in the ER for psychiatric issues including end-stage dementia and agitation.  Per report, patient with worsening lethargy over the course of the night.  This is a fairly new finding though patient has significant agitation as well as secondary lethargy after antipsychotic regimen.  Patient noted had a rectal temp around 95-96 on evaluation by nursing.  No reports of cough, shortness of breath.  No reports of chest pain.  No abdominal pain or reported diarrhea.  No reported falls.  Systolic pressures have been fairly stable though with pressures dropping into the 90s overnight.  Noted chronic small sacral pressure ulcer.  Psychiatry noted have been consulted on May 9 for worsening agitation in setting of end-stage dementia.  Medications including Depakote  and Zyprexa  ordered for psychiatric management. Overnight in the ER with a Tmax of 95.5, heart rate 50s to 70s.  Respirations into the 20s.  Blood pressure 90s to 110s over 40s to 50s.  Satting well on room air.  White count 5.1, hemoglobin 14.5, platelets 100, creatinine 1.53.  LFTs and T. bili grossly within normal limits.  Urinalysis not indicative of infection.  Chest x-ray and CT head grossly within normal limits.  CT abdomen pelvis with initial concern for gallbladder pathology however ruled out.  General surgery consultation or requested and appreciated.  No surgical intervention warranted.  General surgery has signed off as of 5/14.  LFTs have normalized.  5/14: Patient remain as agitated, dangerous behavior  towards staff.    Assessment & Plan:   Principal Problem:   Aggressive behavior due to dementia [facility]) Active Problems:   Hypothermia   Heart failure with preserved ejection fraction (HFpEF)    Acquired hypothyroidism   Mixed hyperlipidemia   Essential hypertension   GERD (gastroesophageal reflux disease)   CKD (chronic kidney disease)   Complete heart block (HCC)   Dementia (HCC)   Physically aggressive behavior   Pressure injury of skin  Dementia (HCC) Severe agitation Patient has been persistently agitated.  He was in the ED holding for an extended period of time greater than 3 months.  During that time psychiatry service was titrating his medications however medication titration was limited by QTc prolongation and hypothermia.  I have discussed patient's care with psychiatric consultant.  Recommendations appreciated.  Zyprexa  5 mg 3 times daily and oral Klonopin .  Patient lost IV access on 5/18.  Did not wish to replace at this time.  Proceed with IM Ativan  for agitation  Functional decline Poor quality of life Adult failure to thrive Patient has no quality of life.  He has been in the hospital for greater than 3 months.  He has been extremely agitated at times.  Violent with behaviors of hitting, kicking, spitting at, biting nursing staff.  Unfortunately the patient's antipsychotic regimen may put him at increased risk of cardiac complications.  This may be increasing his morbidity.   Plan: At this time I strongly recommend making the patient a DO NOT RESUSCITATE and further consideration for palliative care/engagement of hospice services.  The patient's oral intake has been poor and he is  on several antipsychotics that are increasing his underlying risk of arrhythmia and cardiovascular complications.  Psychiatry consultant has spoken with the patient's legal guardian who will escalate the case to DSS supervisors.  If any information is needed from the medical team please reach  out to us .  The patient has a poor quality of life and medication burden is worsening his underlying morbidity and mortality.    Hypothermia Borderline SIRS Temperature 95.5 in ED on admission Meeting borderline SIRS criteria with hypothermia as well as respirations at 20 White count within normal limits Procalcitonin negative.  Lactate normal. No clinical evidence of infection No evidence of sepsis.  Ruled out Plan: Continue holding antibiotics.  Monitor vitals and fever curve.  Low threshold to start antimicrobial therapy with any signs of evolving infection   Heart failure with preserved ejection fraction (HFpEF)  2D echo May 2024 with EF of 50 to 55% and grade 1 diastolic dysfunction Appears euvolemic.  Continue diuretic and antihypertensive regimen       Complete heart block (HCC) Status post pacemaker   CKD (chronic kidney disease) Creatinine 1.5 today with GFR in the 50s Appears near baseline Monitor   GERD (gastroesophageal reflux disease) PPI   Essential hypertension Systolic pressures 90s on presentation with overlapping hypothermia Antihypertensive and diuretic restarted   Mixed hyperlipidemia CK normal.  Continue Lipitor   Acquired hypothyroidism Continue Synthroid  TSH normal   DVT prophylaxis: SQ Lovenox  Code Status: Full Family Communication: None Disposition Plan: Status is: Inpatient Remains inpatient appropriate because: Unsafe discharge plan.  Patient is medically appropriate for discharge at this time.    Level of care: Telemetry Medical  Consultants:  Psychiatry  Procedures:  None  Antimicrobials: None   Subjective: Seen and examined.  Lethargic.  Unable to answer questions  Objective: Vitals:   11/27/23 2011 11/28/23 0307 11/28/23 0916 11/28/23 0918  BP: (!) 126/94 (!) 115/102 111/77   Pulse: 80 65 77 77  Resp: 18 18 16    Temp: (!) 97.2 F (36.2 C) (!) 97.1 F (36.2 C) 97.7 F (36.5 C)   TempSrc: Axillary Axillary     SpO2: 100% 93%  93%  Weight:      Height:        Intake/Output Summary (Last 24 hours) at 11/28/2023 1055 Last data filed at 11/27/2023 2035 Gross per 24 hour  Intake 50 ml  Output --  Net 50 ml   Filed Weights   08/23/23 2222  Weight: 97.5 kg    Examination:  General exam: Ill-appearing.  Fatigued.  Disheveled. Respiratory system: Clear.  Normal work of breathing.  Room air Cardiovascular system: S1-S2, RRR, no murmurs, trace pedal edema Gastrointestinal system: Soft, NT/ND, normal bowel sounds Central nervous system: Awake.  Lethargic.  Unable to assess orientation Extremities: Symmetric 5 x 5 power.  Gait not assessed Skin: No rashes, lesions or ulcers Psychiatry: Unable to assess    Data Reviewed: I have personally reviewed following labs and imaging studies  CBC: Recent Labs  Lab 11/23/23 0550 11/24/23 0806 11/27/23 1013  WBC 5.1 4.4 12.8*  NEUTROABS 1.8  --  7.3  HGB 14.5 13.2 15.1  HCT 45.2 39.2 45.5  MCV 84.3 81.0 80.8  PLT 100* 84* 97*   Basic Metabolic Panel: Recent Labs  Lab 11/23/23 0550 11/24/23 0806 11/27/23 1013  NA 135 141 142  K 3.6 3.9 3.6  CL 100 111 110  CO2 25 24 20*  GLUCOSE 95 89 123*  BUN 34* 26* 40*  CREATININE  1.53* 1.30* 1.62*  CALCIUM  9.1 8.9 8.9   GFR: Estimated Creatinine Clearance: 38.9 mL/min (A) (by C-G formula based on SCr of 1.62 mg/dL (H)). Liver Function Tests: Recent Labs  Lab 11/23/23 0550 11/24/23 0806  AST 44* 35  ALT 30 26  ALKPHOS 87 69  BILITOT 0.8 0.9  PROT 6.2* 5.4*  ALBUMIN 3.6 3.0*   No results for input(s): "LIPASE", "AMYLASE" in the last 168 hours. Recent Labs  Lab 11/23/23 0911  AMMONIA <13   Coagulation Profile: Recent Labs  Lab 11/23/23 0945  INR 1.1   Cardiac Enzymes: Recent Labs  Lab 11/23/23 0911  CKTOTAL 21*   BNP (last 3 results) No results for input(s): "PROBNP" in the last 8760 hours. HbA1C: No results for input(s): "HGBA1C" in the last 72 hours. CBG: Recent  Labs  Lab 11/23/23 0554  GLUCAP 96   Lipid Profile: No results for input(s): "CHOL", "HDL", "LDLCALC", "TRIG", "CHOLHDL", "LDLDIRECT" in the last 72 hours. Thyroid  Function Tests: No results for input(s): "TSH", "T4TOTAL", "FREET4", "T3FREE", "THYROIDAB" in the last 72 hours.  Anemia Panel: No results for input(s): "VITAMINB12", "FOLATE", "FERRITIN", "TIBC", "IRON", "RETICCTPCT" in the last 72 hours. Sepsis Labs: Recent Labs  Lab 11/23/23 0550 11/23/23 1055  PROCALCITON  --  <0.10  LATICACIDVEN 1.3  --     Recent Results (from the past 240 hours)  Culture, blood (routine x 2)     Status: None   Collection Time: 11/23/23  5:50 AM   Specimen: BLOOD  Result Value Ref Range Status   Specimen Description BLOOD RIGHT ARM  Final   Special Requests   Final    BOTTLES DRAWN AEROBIC AND ANAEROBIC Blood Culture results may not be optimal due to an inadequate volume of blood received in culture bottles   Culture   Final    NO GROWTH 5 DAYS Performed at [facility], [address] [place], [place] [address]    Report Status 11/28/2023 FINAL  Final  Culture, blood (routine x 2)     Status: None   Collection Time: 11/23/23  5:50 AM   Specimen: BLOOD  Result Value Ref Range Status   Specimen Description BLOOD LEFT ARM  Final   Special Requests   Final    BOTTLES DRAWN AEROBIC AND ANAEROBIC Blood Culture results may not be optimal due to an inadequate volume of blood received in culture bottles   Culture   Final    NO GROWTH 5 DAYS Performed at [facility], [address] [place], [place] [address]    Report Status 11/28/2023 FINAL  Final  Resp panel by RT-PCR (RSV, Flu A&B, Covid) Anterior Nasal Swab     Status: None   Collection Time: 11/23/23  5:50 AM   Specimen: Anterior Nasal Swab  Result Value Ref Range Status   SARS Coronavirus 2 by RT PCR NEGATIVE NEGATIVE Final    Comment: (NOTE) SARS-CoV-2 target nucleic acids are NOT DETECTED.  The SARS-CoV-2  RNA is generally detectable in upper respiratory specimens during the acute phase of infection. The lowest concentration of SARS-CoV-2 viral copies this assay can detect is 138 copies/mL. A negative result does not preclude SARS-Cov-2 infection and should not be used as the sole basis for treatment or other patient management decisions. A negative result may occur with  improper specimen collection/handling, submission of specimen other than nasopharyngeal swab, presence of viral mutation(s) within the areas targeted by this assay, and inadequate number of viral copies(<138 copies/mL). A negative  result must be combined with clinical observations, patient history, and epidemiological information. The expected result is Negative.  Fact Sheet for Patients:  BloggerCourse.com  Fact Sheet for Healthcare Providers:  SeriousBroker.it  This test is no t yet approved or cleared by the [place]  FDA and  has been authorized for detection and/or diagnosis of SARS-CoV-2 by FDA under an Emergency Use Authorization (EUA). This EUA will remain  in effect (meaning this test can be used) for the duration of the COVID-19 declaration under Section 564(b)(1) of the Act, 21 U.S.C.section 360bbb-3(b)(1), unless the authorization is terminated  or revoked sooner.       Influenza A by PCR NEGATIVE NEGATIVE Final   Influenza B by PCR NEGATIVE NEGATIVE Final    Comment: (NOTE) The Xpert Xpress SARS-CoV-2/FLU/RSV plus assay is intended as an aid in the diagnosis of influenza from Nasopharyngeal swab specimens and should not be used as a sole basis for treatment. Nasal washings and aspirates are unacceptable for Xpert Xpress SARS-CoV-2/FLU/RSV testing.  Fact Sheet for Patients: BloggerCourse.com  Fact Sheet for Healthcare Providers: SeriousBroker.it  This test is not yet approved or cleared by the  [place]  FDA and has been authorized for detection and/or diagnosis of SARS-CoV-2 by FDA under an Emergency Use Authorization (EUA). This EUA will remain in effect (meaning this test can be used) for the duration of the COVID-19 declaration under Section 564(b)(1) of the Act, 21 U.S.C. section 360bbb-3(b)(1), unless the authorization is terminated or revoked.     Resp Syncytial Virus by PCR NEGATIVE NEGATIVE Final    Comment: (NOTE) Fact Sheet for Patients: BloggerCourse.com  Fact Sheet for Healthcare Providers: SeriousBroker.it  This test is not yet approved or cleared by the [place]  FDA and has been authorized for detection and/or diagnosis of SARS-CoV-2 by FDA under an Emergency Use Authorization (EUA). This EUA will remain in effect (meaning this test can be used) for the duration of the COVID-19 declaration under Section 564(b)(1) of the Act, 21 U.S.C. section 360bbb-3(b)(1), unless the authorization is terminated or revoked.  Performed at [facility], [address] [place], [place] [address]   Urine Culture     Status: Abnormal   Collection Time: 11/23/23  6:50 AM   Specimen: Urine, Random  Result Value Ref Range Status   Specimen Description   Final    URINE, RANDOM Performed at [facility], [address] [place], [place] [address]    Special Requests   Final    NONE Reflexed from [id number] Performed at [facility], [address] [place], [place] [address]    Culture 80,000 COLONIES/mL STAPHYLOCOCCUS HAEMOLYTICUS (A)  Final   Report Status 11/25/2023 FINAL  Final   Organism ID, Bacteria STAPHYLOCOCCUS HAEMOLYTICUS (A)  Final      Susceptibility   Staphylococcus haemolyticus - MIC*    CIPROFLOXACIN  >=8 RESISTANT Resistant     GENTAMICIN  8 INTERMEDIATE Intermediate     NITROFURANTOIN 64 INTERMEDIATE Intermediate     OXACILLIN >=4 RESISTANT Resistant     TETRACYCLINE  <=1 SENSITIVE Sensitive     VANCOMYCIN  1 SENSITIVE Sensitive     TRIMETH/SULFA <=10 SENSITIVE Sensitive     RIFAMPIN <=0.5 SENSITIVE Sensitive     Inducible Clindamycin POSITIVE Resistant     * 80,000 COLONIES/mL STAPHYLOCOCCUS HAEMOLYTICUS  MRSA Next Gen by PCR, Nasal     Status: None   Collection Time: 11/23/23  1:56 PM   Specimen: Nasal Mucosa; Nasal Swab  Result Value Ref Range  Status   MRSA by PCR Next Gen NOT DETECTED NOT DETECTED Final    Comment: (NOTE) The GeneXpert MRSA Assay (FDA approved for NASAL specimens only), is one component of a comprehensive MRSA colonization surveillance program. It is not intended to diagnose MRSA infection nor to guide or monitor treatment for MRSA infections. Test performance is not FDA approved in patients less than 29 years old. Performed at [facility], [address] [place], [place] [address]          Radiology Studies: No results found.       Scheduled Meds:  amLODipine   2.5 mg Oral Daily   aspirin   81 mg Oral Daily   atorvastatin   20 mg Oral QHS   clonazepam   1 mg Oral BID   cyanocobalamin   1,000 mcg Intramuscular Q30 days   enoxaparin  (LOVENOX ) injection  40 mg Subcutaneous Q24H   ferrous sulfate   325 mg Oral Q breakfast   furosemide   40 mg Oral Daily   levothyroxine   75 mcg Oral q morning   losartan   50 mg Oral Daily   OLANZapine   5 mg Oral TID   pantoprazole   40 mg Oral Daily   polyethylene glycol  17 g Oral Daily   senna-docusate  1 tablet Oral BID   sertraline   150 mg Oral Daily   Continuous Infusions:   LOS: 5 days     Tiajuana Fluke, MD [facility]   If 7PM-7AM, please contact night-coverage  11/28/2023, 10:55 AM

## 2023-11-29 DIAGNOSIS — F03918 Unspecified dementia, unspecified severity, with other behavioral disturbance: Secondary | ICD-10-CM | POA: Diagnosis not present

## 2023-11-29 MED ORDER — SERTRALINE HCL 50 MG PO TABS
100.0000 mg | ORAL_TABLET | Freq: Every day | ORAL | Status: DC
  Filled 2023-11-29 (×3): qty 2

## 2023-11-29 NOTE — Progress Notes (Signed)
 PT Cancellation Note  Patient Details Name: Hunter [name] MRN: [id number] DOB: 06-Aug-1933   Cancelled Treatment:     Pt was agitated earlier and received 2mg  of Ativan , now resting quietly. Nursing requested to not disturb patient at this time, will re-attempt next available date/time per POC.    Diona Franklin 11/29/2023, 4:14 PM

## 2023-11-29 NOTE — Consult Note (Signed)
 [facility] Psychiatric Consult Follow up  Patient Name: .Hunter [name]  MRN: [id number]  DOB: [birth year]  Consult Order details:  Orders (From admission, onward)     Start     Ordered   11/19/23 1501  IP CONSULT TO PSYCHIATRY       Ordering Provider: Lind Repine, MD  Provider:  (Not yet assigned)  Question Answer Comment  Place call to: Psych NP   Reason for Consult Consult      11/19/23 1500   10/15/23 1359  IP CONSULT TO PSYCHIATRY       Ordering Provider: Viviano Ground, MD  Provider:  (Not yet assigned)  Question Answer Comment  Consult Timeframe URGENT - requires response within 12 hours   URGENT timeframe requires provider to provider communication, has the provider to provider communication been completed Yes   Reason for Consult? Consult for medication management   Contact phone number where the requesting provider can be reached [phone]      10/15/23 1358   10/09/23 0238  IP CONSULT TO PSYCHIATRY       Ordering Provider: Dewitt Forehand, DO  Provider:  (Not yet assigned)  Question Answer Comment  Consult Timeframe URGENT - requires response within 12 hours   URGENT timeframe requires provider to provider communication, has the provider to provider communication been completed Yes   Reason for Consult? Consult for medication management due to increasing agitation, not improving withy last medication adjustments 10/04/23   Contact phone number where the requesting provider can be reached [phone]      10/09/23 0238   10/08/23 1128  IP CONSULT TO PSYCHIATRY       Comments: Pt depakote  level being checked and psych needs to review/manage meds as agitation has continued.  Ordering Provider: Shane Darling, MD  Provider:  (Not yet assigned)  Question Answer Comment  Place call to: psych   Reason for Consult Other (See Comments) Medication mgmt  Diagnosis/Clinical Info for Consult: continued aggressive behavior      10/08/23 1132   08/24/23 0045  IP CONSULT TO  PSYCHIATRY       Ordering Provider: Norlene Beavers, MD  Provider:  (Not yet assigned)  Question Answer Comment  Place call to: psychiatry   Reason for Consult Consult   Diagnosis/Clinical Info for Consult: aggressive behavior      08/24/23 0044             Mode of Visit: In person    Psychiatry Consult Evaluation  Service Date: Nov 29, 2023 LOS:  LOS: 6 days  Chief Complaint agitation  Primary Psychiatric Diagnoses  Dementia with behavioral disturbance    Assessment  Hunter [name] is a 89 y.o. male admitted: Medicallyfor 08/24/2023 12:14 AM with hx of dementia brought in to ED for agitation form a facility. Patient has been in ED waiting for placement. Psychiatry is requested to get involved again as patient's agitation has been getting worse.  Sensory Deprivation: Legal guardian wants to bring patient's hearing aids and this provider recommended to bring in hearing aids as sensory deprivation can make confusion worse. EKG 11/19/23- QTC 539- discontinued PRN zyprexa  and increased ativan  2 mg Q6 hr PRN.  Discussed extensively with the legal guardian about the risk benefits of the current treatment in the context of patient's uncontrollable agitation and QTc prolongation and EKG.  Also discussed to consider DNR and palliative care given the poor quality of life and all the complications if medicated. Legal guardian  expressed her understanding and gave consent for the medication adjustments.   11/29/23: Patient is noted to be restless in the bed with mittens, trying to pull the mittens out and yelling out.  His eyes remain open.  Given he is hard of hearing and confusion he is not able to communicate even with written down questions as he keeps his eyes closed and is profoundly confused.  Called the guardian again and left a voicemail to further check on the status of DNR decision making.  Continue the current medication.  Patient is a poor oral intake..  Will recommend IV access to give  him IV medication to manage his agitation.  Diagnoses:  Active Hospital problems: Principal Problem:   Aggressive behavior due to dementia [facility]) Active Problems:   Acquired hypothyroidism   Mixed hyperlipidemia   Essential hypertension   GERD (gastroesophageal reflux disease)   CKD (chronic kidney disease)   Heart failure with preserved ejection fraction (HFpEF)    Complete heart block (HCC)   Dementia (HCC)   Physically aggressive behavior   Hypothermia   Pressure injury of skin    Plan   ## Psychiatric Medication Recommendations:  Klonopin  1 mg BID  Zyprexa  5 mg TID ( minimize the use of PRN geodon  as it has more impact on Qtc) Reduced Zoloft  100 mg daily to see if his irritability improves  ## Medical Decision Making Capacity: Not specifically addressed in this encounter  ## Further Work-up:  -- Depakote  levels every morning 11/23/23-79  -- most recent EKG on 11/24/2023 had QtC of 514 -- Pertinent labwork reviewed earlier this admission includes: CMP, ammonia levels   ## Disposition:--Recommend nursing home after better control of behavioral disturbance  ## Behavioral / Environmental: -Delirium Precautions: Delirium Interventions for Nursing and Staff: - RN to open blinds every AM. - To Bedside: Glasses, hearing aide, and pt's own shoes. Make available to patients. when possible and encourage use. - Encourage po fluids when appropriate, keep fluids within reach. - OOB to chair with meals. - Passive ROM exercises to all extremities with AM & PM care. - RN to assess orientation to person, time and place QAM and PRN. - Recommend extended visitation hours with familiar family/friends as feasible. - Staff to minimize disturbances at night. Turn off television when pt asleep or when not in use.    ## Safety and Observation Level:  - Based on my clinical evaluation, I estimate the patient to be at low risk of self harm in the current setting. - At this time, we recommend   routine. This decision is based on my review of the chart including patient's history and current presentation, interview of the patient, mental status examination, and consideration of suicide risk including evaluating suicidal ideation, plan, intent, suicidal or self-harm behaviors, risk factors, and protective factors. This judgment is based on our ability to directly address suicide risk, implement suicide prevention strategies, and develop a safety plan while the patient is in the clinical setting. Please contact our team if there is a concern that risk level has changed.  CSSR Risk Category:C-SSRS RISK CATEGORY: No Risk  Suicide Risk Assessment: Unable to assess at this point as patient has hard of hearing and has no hearing aids, patient completely confused and unable to engage in a meaningful interview  Thank you for this consult request. Recommendations have been communicated to the primary team.  We will continue to follow-up at this time.   Parisha Beaulac, MD  History of Present Illness  Hunter [name] is a 89 y.o. male admitted: Medicallyfor 08/24/2023 12:14 AM with hx of dementia brought in to ED for agitation form a facility. Patient has been in ED waiting for placement. Psychiatry is requested to get involved again as patient's agitation has been getting worse.  11/29/22:Patient is noted to be restless in the bed with mittens, trying to pull the mittens out and yelling out.  His eyes remain open.  Given he is hard of hearing and confusion he is not able to communicate even with written down questions as he keeps his eyes closed and is profoundly confused.    Psychiatric and Social History  Psychiatric History: Unable to obtain as there is no family member available, DSS legal guardian is new to the patient as patient is extremely confused  Social History:  Unable to obtain  Substance History Unable to obtain  Exam Findings  Physical Exam: Reviewed and agree with the  physical exam findings conducted by the ED provider. Vital Signs:  Temp:  [97.9 F (36.6 C)-98.2 F (36.8 C)] 97.9 F (36.6 C) (05/19 0700) Pulse Rate:  [70-76] 70 (05/19 0700) Resp:  [18] 18 (05/19 0700) BP: (99-151)/(62-111) 151/111 (05/19 0700) SpO2:  [98 %-100 %] 100 % (05/19 0700) Blood pressure (!) 151/111, pulse 70, temperature 97.9 F (36.6 C), temperature source Axillary, resp. rate 18, height 6' (1.829 m), weight 97.5 kg, SpO2 100%. Body mass index is 29.16 kg/m.    Mental Status Exam: General Appearance: Disheveled  Orientation:  Negative  Memory:  Negative  Concentration:  pt sedated  Recall:  Negative  Attention  Other: pt hard of hearing  Eye Contact:  pt sedated  Speech:  pt sedated  Language:  pt sedated  Volume:  pt sedated  Mood: unable to assess  Affect:  Labile  Thought Process:  pt sedated  Thought Content:  pt sedated  Suicidal Thoughts:  unable to assess  Homicidal Thoughts:  unable to assess  Judgement:  Impaired  Insight:  Lacking  Psychomotor Activity:  pt sedated  Akathisia:  No  Fund of Knowledge:  Negative      Assets:  Resilience  Cognition:  Impaired,  Moderate  ADL's:  Impaired  AIMS (if indicated):        Other History   These have been pulled in through the EMR, reviewed, and updated if appropriate.  Family History:  The patient's family history includes Arthritis in an other family member; Hyperlipidemia in an other family member; Hypertension in an other family member; Lung cancer in his mother; Prostate cancer in an other family member.  Medical History: Past Medical History:  Diagnosis Date   Allergic rhinitis 02/02/2007   Anemia    Arthritis    "knees" (02/08/2017)   Chronic lower back pain    CKD (chronic kidney disease), stage III (HCC) 05/27/2011   Complete heart block 02/08/2017   Eczema    Essential hypertension 02/02/2007   Lasix  60 mg, losartan  50mg ,  Amlodipine  5mg --> 2.5 mg.  Usually have to repeat BP  measurements  In the past-Maxzide  37.5-25mg .   GERD (gastroesophageal reflux disease) occasional   Hearing loss of both ears    Heart failure with preserved ejection fraction (HFpEF) 12/06/2013   History of skin cancer 03/26/2014   nose    Hx of echocardiogram    Echo (07/2013): Mild LVH, EF 55-60%, grade 1 diastolic dysfunction, mild LAE, PASP 23   Hyperlipidemia    Hypertension  Hypothyroidism    LBBB (left bundle branch block)    Left patella fracture 2018   "no OR" (02/08/2017)   Major depression in partial remission 02/02/2007   Amitriptyline  25mg  for sleep (stop when runs out of #10 04/2018(, zoloft  100-->150mg --> 200mg    Mild neurocognitive disorder 06/21/2019   Neuropathy (HCC) 11/02/2011   Nocturia    Peripheral vascular disease (HCC) LOWER EXTREMITIES   Spinal stenosis in cervical region 03/21/2010   Squamous cell skin cancer, penis: glans (HCC) 05/2010   Initial excision 11/11; recurrence, excision and laser Rx 9/13   Thrombocytopenia (HCC) 05/23/2012   Urinary frequency 09/05/2009   Possible BPH- see Trudi Fus notes    Vitamin D  deficiency 08/15/2008    Surgical History: Past Surgical History:  Procedure Laterality Date   CIRCUMCISION/ LASER DISSECTION PENILE GLANS CANCER  06-02-2010   CYSTOSCOPY WITH URETHRAL DILATATION  03/28/2012   Procedure: CYSTOSCOPY WITH URETHRAL DILATATION;  Surgeon: Edmund Gouge, MD;  Location: [facility];  Service: Urology;  Laterality: N/A;  excision biopsy extensive meatal penile carcinoma meatoplasty   EXCISION RIGHT WRIST BENIGN TUMOR  2002 (APPROX)   hip surgery     INCISION AND DRAINAGE DEEP NECK ABSCESS     INGUINAL HERNIA REPAIR  1970's   KNEE ARTHROSCOPY Right 2017   LUMBAR LAMINECTOMY/DECOMPRESSION MICRODISCECTOMY N/A 05/24/2020   Procedure: OPEN LAMINECTOMY LUMBAR THREE-LUMBAR FOUR;  Surgeon: Pincus Bridgeman, DO;  Location: [facility] OR;  Service: Neurosurgery;  Laterality: N/A;   PACEMAKER IMPLANT N/A 02/08/2017    Procedure: Pacemaker Implant;  Surgeon: Verona Goodwill, MD;  Location: [facility] INVASIVE CV LAB;  Service: Cardiovascular;  Laterality: N/A;   PACEMAKER IMPLANT  02/08/2017   SJM Assurity MRI dual chamber PPM implanted by Dr Rodolfo Clan for complete heart block   PENILE BX  05-09-2010   TONSILLECTOMY     TRANSURETHRAL RESECTION OF PROSTATE N/A 05/10/2015   Procedure: CYSTOSCOPY, URETHRAL MEATAL DILATION, TRANSURETHRAL RESECTION OF THE PROSTATE (TURP);  Surgeon: Annamarie Kid, MD;  Location: [facility];  Service: Urology;  Laterality: N/A;     Medications:   Current Facility-Administered Medications:    acetaminophen  (TYLENOL ) tablet 650 mg, 650 mg, Oral, Q6H PRN, Corrinne Din, MD   albuterol  (PROVENTIL ) (2.5 MG/3ML) 0.083% nebulizer solution 2.5 mg, 2.5 mg, Inhalation, Q8H PRN, Buell Carmin, MD   amLODipine  (NORVASC ) tablet 2.5 mg, 2.5 mg, Oral, Daily, Buell Carmin, MD, 2.5 mg at 11/29/23 4098   aspirin  chewable tablet 81 mg, 81 mg, Oral, Daily, Buell Carmin, MD, 81 mg at 11/29/23 1191   atorvastatin  (LIPITOR) tablet 20 mg, 20 mg, Oral, QHS, Buell Carmin, MD, 20 mg at 11/22/23 2106   bisacodyl  (DULCOLAX) suppository 10 mg, 10 mg, Rectal, Daily PRN, Mosetta Areola, Sudheer B, MD, 10 mg at 11/25/23 1201   clonazePAM  (KLONOPIN ) disintegrating tablet 1 mg, 1 mg, Oral, BID, Donavyn Fecher, MD, 1 mg at 11/29/23 0831   cyanocobalamin  (VITAMIN B12) injection 1,000 mcg, 1,000 mcg, Intramuscular, Q30 days, Buell Carmin, MD, 1,000 mcg at 11/21/23 1018   enoxaparin  (LOVENOX ) injection 40 mg, 40 mg, Subcutaneous, Q24H, Corrinne Din, MD, 40 mg at 11/29/23 4782   ferrous sulfate  tablet 325 mg, 325 mg, Oral, Q breakfast, Buell Carmin, MD, 325 mg at 11/29/23 9562   furosemide  (LASIX ) tablet 40 mg, 40 mg, Oral, Daily, Sreenath, Sudheer B, MD, 40 mg at 11/29/23 1308   levothyroxine  (SYNTHROID ) tablet 75 mcg, 75 mcg, Oral, q morning, Buell Carmin, MD, 75 mcg at 11/29/23 0830  LORazepam  (ATIVAN ) tablet 2 mg, 2 mg, Oral, Q6H  PRN **OR** LORazepam  (ATIVAN ) injection 2 mg, 2 mg, Intramuscular, Q6H PRN, Sreenath, Sudheer B, MD, 2 mg at 11/29/23 1447   losartan  (COZAAR ) tablet 50 mg, 50 mg, Oral, Daily, Buell Carmin, MD, 50 mg at 11/29/23 7829   OLANZapine  (ZYPREXA ) tablet 5 mg, 5 mg, Oral, TID, Alyzae Hawkey, MD, 5 mg at 11/29/23 5621   ondansetron  (ZOFRAN ) tablet 4 mg, 4 mg, Oral, Q6H PRN **OR** ondansetron  (ZOFRAN ) injection 4 mg, 4 mg, Intravenous, Q6H PRN, Corrinne Din, MD   pantoprazole  (PROTONIX ) EC tablet 40 mg, 40 mg, Oral, Daily, Buell Carmin, MD, 40 mg at 11/29/23 3086   polyethylene glycol (MIRALAX  / GLYCOLAX ) packet 17 g, 17 g, Oral, Daily, Sreenath, Sudheer B, MD, 17 g at 11/27/23 1124   senna-docusate (Senokot-S) tablet 1 tablet, 1 tablet, Oral, BID, Sreenath, Sudheer B, MD, 1 tablet at 11/27/23 1116   [START ON 11/30/2023] sertraline  (ZOLOFT ) tablet 100 mg, 100 mg, Oral, Daily, Caetano Oberhaus, MD   ziprasidone  (GEODON ) injection 10 mg, 10 mg, Intramuscular, Q6H PRN, Margery Sheets B, MD, 10 mg at 11/27/23 1748  Allergies: No Known Allergies  Britteny Fiebelkorn, MD

## 2023-11-29 NOTE — Progress Notes (Signed)
 OT Cancellation Note  Patient Details Name: Hunter [name] MRN: [id number] DOB: 1933-12-23   Cancelled Treatment:    Reason Eval/Treat Not Completed: Fatigue/lethargy limiting ability to participate; Pt sleeping upon OT arrival.  OT attempted to arouse pt with gentle touch to both arms, but pt did not awaken.  Charge nurse confirmed pt had been medicated and requested to let pt remain sleeping d/t behavior challenges earlier.  Marcus Sewer, MS, OTR/L  Casandra Claw 11/29/2023, 11:49 AM

## 2023-11-29 NOTE — Progress Notes (Addendum)
 PROGRESS NOTE    Hunter [name]  ZOX:[id number] DOB: 08/07/1933 DOA: 08/24/2023 PCP: Almira Jaeger, MD    Brief Narrative:    89 y.o. male with medical history significant of hypertension, chronic HFpEF, hyperlipidemia, complete bundle branch block status post pacemaker, stage III CKD, dementia currently in ED holding.  Requesting admission for hypothermia as well as lethargy.  Per report, patient has been a longstanding boarding patient in the ER for psychiatric issues including end-stage dementia and agitation.  Per report, patient with worsening lethargy over the course of the night.  This is a fairly new finding though patient has significant agitation as well as secondary lethargy after antipsychotic regimen.  Patient noted had a rectal temp around 95-96 on evaluation by nursing.  No reports of cough, shortness of breath.  No reports of chest pain.  No abdominal pain or reported diarrhea.  No reported falls.  Systolic pressures have been fairly stable though with pressures dropping into the 90s overnight.  Noted chronic small sacral pressure ulcer.  Psychiatry noted have been consulted on May 9 for worsening agitation in setting of end-stage dementia.  Medications including Depakote  and Zyprexa  ordered for psychiatric management. Overnight in the ER with a Tmax of 95.5, heart rate 50s to 70s.  Respirations into the 20s.  Blood pressure 90s to 110s over 40s to 50s.  Satting well on room air.  White count 5.1, hemoglobin 14.5, platelets 100, creatinine 1.53.  LFTs and T. bili grossly within normal limits.  Urinalysis not indicative of infection.  Chest x-ray and CT head grossly within normal limits.  CT abdomen pelvis with initial concern for gallbladder pathology however ruled out.  General surgery consultation or requested and appreciated.  No surgical intervention warranted.  General surgery has signed off as of 5/14.  LFTs have normalized.  5/14: Patient remain as agitated, dangerous behavior  towards staff.    Assessment & Plan:   Principal Problem:   Aggressive behavior due to dementia [facility]) Active Problems:   Hypothermia   Heart failure with preserved ejection fraction (HFpEF)    Acquired hypothyroidism   Mixed hyperlipidemia   Essential hypertension   GERD (gastroesophageal reflux disease)   CKD (chronic kidney disease)   Complete heart block (HCC)   Dementia (HCC)   Physically aggressive behavior   Pressure injury of skin  Dementia (HCC) Severe agitation Patient has been persistently agitated.  He was in the ED holding for an extended period of time greater than 3 months.  During that time psychiatry service was titrating his medications however medication titration was limited by QTc prolongation and hypothermia.  I have discussed patient's care with psychiatric consultant.  Recommendations appreciated.  Zyprexa  5 mg 3 times daily and oral Klonopin .  Patient lost IV access on 5/18.  Did not wish to replace at this time.  IM Ativan  for agitation.  Attempt to avoid IM Geodon  but is present if unable to calm or redirect otherwise.  Current treatment regimen is likely worsening the patient's underlying morbidity and mortality increasing the risk of cardiovascular demise.  At this point his quality of life is very poor.  Hospital environment is deleterious on his mental status and strongly urge DSS guardianship to consider de-escalation of care, DNR status, engagement of hospice services.   Functional decline Poor quality of life Adult failure to thrive Patient has no quality of life.  He has been in the hospital for greater than 3 months.  He has been extremely agitated at  times.  Violent with behaviors of hitting, kicking, spitting at, biting nursing staff.  Unfortunately the patient's antipsychotic regimen may put him at increased risk of cardiac complications.  This may be increasing his morbidity.   Plan: At this time I strongly recommend making the patient a DO NOT  RESUSCITATE and further consideration for palliative care/engagement of hospice services.  The patient's oral intake has been poor and he is on several antipsychotics that are increasing his underlying risk of arrhythmia and cardiovascular complications.  Psychiatry consultant has spoken with the patient's legal guardian who will escalate the case to DSS supervisors.  If any information is needed from the medical team please reach out to us .  The patient has a poor quality of life and medication burden is worsening his underlying morbidity and mortality.    Hypothermia Borderline SIRS Temperature 95.5 in ED on admission Meeting borderline SIRS criteria with hypothermia as well as respirations at 20 White count within normal limits Procalcitonin negative.  Lactate normal. No clinical evidence of infection No evidence of sepsis.  Ruled out Plan: Continue holding antibiotics.  Monitor vitals and fever curve.  Low threshold to start antimicrobial therapy with any signs of evolving infection   Heart failure with preserved ejection fraction (HFpEF)  2D echo May 2024 with EF of 50 to 55% and grade 1 diastolic dysfunction Appears euvolemic.  Continue diuretic and antihypertensive regimen       Complete heart block (HCC) Status post pacemaker   CKD (chronic kidney disease) Creatinine 1.5 today with GFR in the 50s Appears near baseline Monitor   GERD (gastroesophageal reflux disease) PPI   Essential hypertension Systolic pressures 90s on presentation with overlapping hypothermia Antihypertensive and diuretic restarted   Mixed hyperlipidemia CK normal.  Continue Lipitor   Acquired hypothyroidism Continue Synthroid  TSH normal   DVT prophylaxis: SQ Lovenox  Code Status: Full Family Communication: Left voicemail for legal guardian [name] [phone] on 5/19 Disposition Plan: Status is: Inpatient Remains inpatient appropriate because: Unsafe discharge plan.  Patient is  medically appropriate for discharge at this time.    Level of care: Telemetry Medical  Consultants:  Psychiatry  Procedures:  None  Antimicrobials: None   Subjective: Seen and examined.  Agitated last night.  Calm on my evaluation.  Objective: Vitals:   11/28/23 0916 11/28/23 0918 11/28/23 1940 11/29/23 0700  BP: 111/77  99/62 (!) 151/111  Pulse: 77 77 76 70  Resp: 16  18 18   Temp: 97.7 F (36.5 C)  98.2 F (36.8 C) 97.9 F (36.6 C)  TempSrc:    Axillary  SpO2:  93% 98% 100%  Weight:      Height:       No intake or output data in the 24 hours ending 11/29/23 1041  Filed Weights   08/23/23 2222  Weight: 97.5 kg    Examination:  General exam: Ill-appearing.  Fatigued.  Disheveled. Respiratory system: Clear.  Normal work of breathing.  Room air Cardiovascular system: S1-S2, RRR, no murmurs, trace pedal edema Gastrointestinal system: Soft, NT/ND, normal bowel sounds Central nervous system: Awake.  Lethargic.  Unable to assess orientation Extremities: Symmetric 5 x 5 power.  Gait not assessed Skin: No rashes, lesions or ulcers Psychiatry: Unable to assess    Data Reviewed: I have personally reviewed following labs and imaging studies  CBC: Recent Labs  Lab 11/23/23 0550 11/24/23 0806 11/27/23 1013  WBC 5.1 4.4 12.8*  NEUTROABS 1.8  --  7.3  HGB 14.5 13.2 15.1  HCT  45.2 39.2 45.5  MCV 84.3 81.0 80.8  PLT 100* 84* 97*   Basic Metabolic Panel: Recent Labs  Lab 11/23/23 0550 11/24/23 0806 11/27/23 1013  NA 135 141 142  K 3.6 3.9 3.6  CL 100 111 110  CO2 25 24 20*  GLUCOSE 95 89 123*  BUN 34* 26* 40*  CREATININE 1.53* 1.30* 1.62*  CALCIUM  9.1 8.9 8.9   GFR: Estimated Creatinine Clearance: 38.9 mL/min (A) (by C-G formula based on SCr of 1.62 mg/dL (H)). Liver Function Tests: Recent Labs  Lab 11/23/23 0550 11/24/23 0806  AST 44* 35  ALT 30 26  ALKPHOS 87 69  BILITOT 0.8 0.9  PROT 6.2* 5.4*  ALBUMIN 3.6 3.0*   No results for  input(s): "LIPASE", "AMYLASE" in the last 168 hours. Recent Labs  Lab 11/23/23 0911  AMMONIA <13   Coagulation Profile: Recent Labs  Lab 11/23/23 0945  INR 1.1   Cardiac Enzymes: Recent Labs  Lab 11/23/23 0911  CKTOTAL 21*   BNP (last 3 results) No results for input(s): "PROBNP" in the last 8760 hours. HbA1C: No results for input(s): "HGBA1C" in the last 72 hours. CBG: Recent Labs  Lab 11/23/23 0554  GLUCAP 96   Lipid Profile: No results for input(s): "CHOL", "HDL", "LDLCALC", "TRIG", "CHOLHDL", "LDLDIRECT" in the last 72 hours. Thyroid  Function Tests: No results for input(s): "TSH", "T4TOTAL", "FREET4", "T3FREE", "THYROIDAB" in the last 72 hours.  Anemia Panel: No results for input(s): "VITAMINB12", "FOLATE", "FERRITIN", "TIBC", "IRON", "RETICCTPCT" in the last 72 hours. Sepsis Labs: Recent Labs  Lab 11/23/23 0550 11/23/23 1055  PROCALCITON  --  <0.10  LATICACIDVEN 1.3  --     Recent Results (from the past 240 hours)  Culture, blood (routine x 2)     Status: None   Collection Time: 11/23/23  5:50 AM   Specimen: BLOOD  Result Value Ref Range Status   Specimen Description BLOOD RIGHT ARM  Final   Special Requests   Final    BOTTLES DRAWN AEROBIC AND ANAEROBIC Blood Culture results may not be optimal due to an inadequate volume of blood received in culture bottles   Culture   Final    NO GROWTH 5 DAYS Performed at [facility], [address] [place], [place] [address]    Report Status 11/28/2023 FINAL  Final  Culture, blood (routine x 2)     Status: None   Collection Time: 11/23/23  5:50 AM   Specimen: BLOOD  Result Value Ref Range Status   Specimen Description BLOOD LEFT ARM  Final   Special Requests   Final    BOTTLES DRAWN AEROBIC AND ANAEROBIC Blood Culture results may not be optimal due to an inadequate volume of blood received in culture bottles   Culture   Final    NO GROWTH 5 DAYS Performed at [facility], [address] [place], [place] [address]    Report Status 11/28/2023 FINAL  Final  Resp panel by RT-PCR (RSV, Flu A&B, Covid) Anterior Nasal Swab     Status: None   Collection Time: 11/23/23  5:50 AM   Specimen: Anterior Nasal Swab  Result Value Ref Range Status   SARS Coronavirus 2 by RT PCR NEGATIVE NEGATIVE Final    Comment: (NOTE) SARS-CoV-2 target nucleic acids are NOT DETECTED.  The SARS-CoV-2 RNA is generally detectable in upper respiratory specimens during the acute phase of infection. The lowest concentration of SARS-CoV-2 viral copies this assay can detect is 138 copies/mL. A  negative result does not preclude SARS-Cov-2 infection and should not be used as the sole basis for treatment or other patient management decisions. A negative result may occur with  improper specimen collection/handling, submission of specimen other than nasopharyngeal swab, presence of viral mutation(s) within the areas targeted by this assay, and inadequate number of viral copies(<138 copies/mL). A negative result must be combined with clinical observations, patient history, and epidemiological information. The expected result is Negative.  Fact Sheet for Patients:  BloggerCourse.com  Fact Sheet for Healthcare Providers:  SeriousBroker.it  This test is no t yet approved or cleared by the [place]  FDA and  has been authorized for detection and/or diagnosis of SARS-CoV-2 by FDA under an Emergency Use Authorization (EUA). This EUA will remain  in effect (meaning this test can be used) for the duration of the COVID-19 declaration under Section 564(b)(1) of the Act, 21 U.S.C.section 360bbb-3(b)(1), unless the authorization is terminated  or revoked sooner.       Influenza A by PCR NEGATIVE NEGATIVE Final   Influenza B by PCR NEGATIVE NEGATIVE Final    Comment: (NOTE) The Xpert Xpress SARS-CoV-2/FLU/RSV plus assay is intended as an aid in the diagnosis of  influenza from Nasopharyngeal swab specimens and should not be used as a sole basis for treatment. Nasal washings and aspirates are unacceptable for Xpert Xpress SARS-CoV-2/FLU/RSV testing.  Fact Sheet for Patients: BloggerCourse.com  Fact Sheet for Healthcare Providers: SeriousBroker.it  This test is not yet approved or cleared by the [place]  FDA and has been authorized for detection and/or diagnosis of SARS-CoV-2 by FDA under an Emergency Use Authorization (EUA). This EUA will remain in effect (meaning this test can be used) for the duration of the COVID-19 declaration under Section 564(b)(1) of the Act, 21 U.S.C. section 360bbb-3(b)(1), unless the authorization is terminated or revoked.     Resp Syncytial Virus by PCR NEGATIVE NEGATIVE Final    Comment: (NOTE) Fact Sheet for Patients: BloggerCourse.com  Fact Sheet for Healthcare Providers: SeriousBroker.it  This test is not yet approved or cleared by the [place]  FDA and has been authorized for detection and/or diagnosis of SARS-CoV-2 by FDA under an Emergency Use Authorization (EUA). This EUA will remain in effect (meaning this test can be used) for the duration of the COVID-19 declaration under Section 564(b)(1) of the Act, 21 U.S.C. section 360bbb-3(b)(1), unless the authorization is terminated or revoked.  Performed at [facility], [address] [place], [place] [address]   Urine Culture     Status: Abnormal   Collection Time: 11/23/23  6:50 AM   Specimen: Urine, Random  Result Value Ref Range Status   Specimen Description   Final    URINE, RANDOM Performed at [facility], [address] [place], [place] [address]    Special Requests   Final    NONE Reflexed from [id number] Performed at [place], [address] [place], [place] [address]    Culture 80,000 COLONIES/mL  STAPHYLOCOCCUS HAEMOLYTICUS (A)  Final   Report Status 11/25/2023 FINAL  Final   Organism ID, Bacteria STAPHYLOCOCCUS HAEMOLYTICUS (A)  Final      Susceptibility   Staphylococcus haemolyticus - MIC*    CIPROFLOXACIN  >=8 RESISTANT Resistant     GENTAMICIN  8 INTERMEDIATE Intermediate     NITROFURANTOIN 64 INTERMEDIATE Intermediate     OXACILLIN >=4 RESISTANT Resistant     TETRACYCLINE <=1 SENSITIVE Sensitive     VANCOMYCIN  1 SENSITIVE Sensitive     TRIMETH/SULFA <=10  SENSITIVE Sensitive     RIFAMPIN <=0.5 SENSITIVE Sensitive     Inducible Clindamycin POSITIVE Resistant     * 80,000 COLONIES/mL STAPHYLOCOCCUS HAEMOLYTICUS  MRSA Next Gen by PCR, Nasal     Status: None   Collection Time: 11/23/23  1:56 PM   Specimen: Nasal Mucosa; Nasal Swab  Result Value Ref Range Status   MRSA by PCR Next Gen NOT DETECTED NOT DETECTED Final    Comment: (NOTE) The GeneXpert MRSA Assay (FDA approved for NASAL specimens only), is one component of a comprehensive MRSA colonization surveillance program. It is not intended to diagnose MRSA infection nor to guide or monitor treatment for MRSA infections. Test performance is not FDA approved in patients less than 70 years old. Performed at [facility], [address], [place], [place] [address]          Radiology Studies: No results found.       Scheduled Meds:  amLODipine   2.5 mg Oral Daily   aspirin   81 mg Oral Daily   atorvastatin   20 mg Oral QHS   clonazepam   1 mg Oral BID   cyanocobalamin   1,000 mcg Intramuscular Q30 days   enoxaparin  (LOVENOX ) injection  40 mg Subcutaneous Q24H   ferrous sulfate   325 mg Oral Q breakfast   furosemide   40 mg Oral Daily   levothyroxine   75 mcg Oral q morning   losartan   50 mg Oral Daily   OLANZapine   5 mg Oral TID   pantoprazole   40 mg Oral Daily   polyethylene glycol  17 g Oral Daily   senna-docusate  1 tablet Oral BID   sertraline   150 mg Oral Daily   Continuous Infusions:   LOS: 6  days     Tiajuana Fluke, MD [facility]   If 7PM-7AM, please contact night-coverage  11/29/2023, 10:41 AM

## 2023-11-30 DIAGNOSIS — F03918 Unspecified dementia, unspecified severity, with other behavioral disturbance: Secondary | ICD-10-CM | POA: Diagnosis not present

## 2023-11-30 NOTE — Progress Notes (Signed)
 PROGRESS NOTE    Hunter [name]  ZOX:[id number] DOB: [birth year] DOA: 08/24/2023 PCP: Almira Jaeger, MD    Brief Narrative:    89 y.o. male with medical history significant of hypertension, chronic HFpEF, hyperlipidemia, complete bundle branch block status post pacemaker, stage III CKD, dementia currently in ED holding.  Requesting admission for hypothermia as well as lethargy.  Per report, patient has been a longstanding boarding patient in the ER for psychiatric issues including end-stage dementia and agitation.  Per report, patient with worsening lethargy over the course of the night.  This is a fairly new finding though patient has significant agitation as well as secondary lethargy after antipsychotic regimen.  Patient noted had a rectal temp around 95-96 on evaluation by nursing.  No reports of cough, shortness of breath.  No reports of chest pain.  No abdominal pain or reported diarrhea.  No reported falls.  Systolic pressures have been fairly stable though with pressures dropping into the 90s overnight.  Noted chronic small sacral pressure ulcer.  Psychiatry noted have been consulted on May 9 for worsening agitation in setting of end-stage dementia.  Medications including Depakote  and Zyprexa  ordered for psychiatric management. Overnight in the ER with a Tmax of 95.5, heart rate 50s to 70s.  Respirations into the 20s.  Blood pressure 90s to 110s over 40s to 50s.  Satting well on room air.  White count 5.1, hemoglobin 14.5, platelets 100, creatinine 1.53.  LFTs and T. bili grossly within normal limits.  Urinalysis not indicative of infection.  Chest x-ray and CT head grossly within normal limits.  CT abdomen pelvis with initial concern for gallbladder pathology however ruled out.  General surgery consultation or requested and appreciated.  No surgical intervention warranted.  General surgery has signed off as of 5/14.  LFTs have normalized.  5/14: Patient remain as agitated, dangerous behavior  towards staff.     Assessment & Plan:   Principal Problem:   Aggressive behavior due to dementia [facility]) Active Problems:   Hypothermia   Heart failure with preserved ejection fraction (HFpEF)    Acquired hypothyroidism   Mixed hyperlipidemia   Essential hypertension   GERD (gastroesophageal reflux disease)   CKD (chronic kidney disease)   Complete heart block (HCC)   Dementia (HCC)   Physically aggressive behavior   Pressure injury of skin  Dementia (HCC) Severe agitation Patient has been persistently agitated.  He was in the ED holding for an extended period of time greater than 3 months.  During that time psychiatry service was titrating his medications however medication titration was limited by QTc prolongation and hypothermia.  I have discussed patient's care with psychiatric consultant.  Recommendations appreciated.  Zyprexa  5 mg 3 times daily and oral Klonopin .  Patient lost IV access on 5/18.  Did not wish to replace at this time.  IM Ativan  for agitation.  Attempt to avoid IM Geodon  but is present if unable to calm or redirect otherwise.  Current treatment regimen is likely worsening the patient's underlying morbidity and mortality increasing the risk of cardiovascular demise.  At this point his quality of life is very poor.  Hospital environment is deleterious on his mental status and strongly urge DSS guardianship to consider de-escalation of care, DNR status, engagement of hospice services.  Appreciate assistance from psychiatry service   Functional decline Poor quality of life Adult failure to thrive Patient has no quality of life.  He has been in the hospital for greater than 3 months.  He has been extremely agitated at times.  Violent with behaviors of hitting, kicking, spitting at, biting nursing staff.  Unfortunately the patient's antipsychotic regimen may put him at increased risk of cardiac complications.  This may be increasing his morbidity.   Plan: At this  time I strongly recommend making the patient a DO NOT RESUSCITATE and further consideration for palliative care/engagement of hospice services.  The patient's oral intake has been poor and he is on several antipsychotics that are increasing his underlying risk of arrhythmia and cardiovascular complications.  Psychiatry consultant has spoken with the patient's legal guardian who will escalate the case to DSS supervisors.  If any information is needed from the medical team please reach out to us .  The patient has a poor quality of life and medication burden is worsening his underlying morbidity and mortality.    Hypothermia Borderline SIRS Temperature 95.5 in ED on admission Meeting borderline SIRS criteria with hypothermia as well as respirations at 20 White count within normal limits Procalcitonin negative.  Lactate normal. No clinical evidence of infection No evidence of sepsis.  Ruled out Plan: Continue holding antibiotics.  Monitor vitals and fever curve.  Low threshold to start antimicrobial therapy with any signs of evolving infection   Heart failure with preserved ejection fraction (HFpEF)  2D echo May 2024 with EF of 50 to 55% and grade 1 diastolic dysfunction Appears euvolemic.  Continue diuretic and antihypertensive regimen       Complete heart block (HCC) Status post pacemaker   CKD (chronic kidney disease) Creatinine 1.5 today with GFR in the 50s Appears near baseline Monitor   GERD (gastroesophageal reflux disease) PPI   Essential hypertension Systolic pressures 90s on presentation with overlapping hypothermia Antihypertensive and diuretic restarted   Mixed hyperlipidemia CK normal.  Continue Lipitor   Acquired hypothyroidism Continue Synthroid  TSH normal   DVT prophylaxis: SQ Lovenox  Code Status: Full Family Communication: Left voicemail for legal guardian [name] [phone] on 5/19 Disposition Plan: Status is: Inpatient Remains inpatient  appropriate because: Unsafe discharge plan   Level of care: Telemetry Medical  Consultants:  Psychiatry  Procedures:  None  Antimicrobials: None   Subjective: Seen and examined.  Lethargic.  Obtunded  Objective: Vitals:   11/29/23 0700 11/29/23 2041 11/30/23 0500 11/30/23 0818  BP: (!) 151/111 94/65 (!) 140/60 129/76  Pulse: 70 71 65 80  Resp: 18 17 17 17   Temp: 97.9 F (36.6 C) 98.6 F (37 C) (!) 97.4 F (36.3 C) 98 F (36.7 C)  TempSrc: Axillary Axillary Axillary   SpO2: 100% 98% 97% 99%  Weight:      Height:       No intake or output data in the 24 hours ending 11/30/23 1255  Filed Weights   08/23/23 2222  Weight: 97.5 kg    Examination:  General exam:  ill-appearing.  Fatigue.  Lethargic Respiratory system: Clear.  Normal work of breathing.  Room air Cardiovascular system: S1-S2, RRR, no murmurs, trace pedal edema Gastrointestinal system: Soft, NT/ND, normal bowel sounds Central nervous system: Awake.  Lethargic.  Unable to assess orientation Extremities: Symmetric 5 x 5 power.  Gait not assessed Skin: No rashes, lesions or ulcers Psychiatry: Unable to assess    Data Reviewed: I have personally reviewed following labs and imaging studies  CBC: Recent Labs  Lab 11/24/23 0806 11/27/23 1013  WBC 4.4 12.8*  NEUTROABS  --  7.3  HGB 13.2 15.1  HCT 39.2 45.5  MCV 81.0 80.8  PLT 84*  97*   Basic Metabolic Panel: Recent Labs  Lab 11/24/23 0806 11/27/23 1013  NA 141 142  K 3.9 3.6  CL 111 110  CO2 24 20*  GLUCOSE 89 123*  BUN 26* 40*  CREATININE 1.30* 1.62*  CALCIUM  8.9 8.9   GFR: Estimated Creatinine Clearance: 38.9 mL/min (A) (by C-G formula based on SCr of 1.62 mg/dL (H)). Liver Function Tests: Recent Labs  Lab 11/24/23 0806  AST 35  ALT 26  ALKPHOS 69  BILITOT 0.9  PROT 5.4*  ALBUMIN 3.0*   No results for input(s): "LIPASE", "AMYLASE" in the last 168 hours. No results for input(s): "AMMONIA" in the last 168  hours.  Coagulation Profile: No results for input(s): "INR", "PROTIME" in the last 168 hours.  Cardiac Enzymes: No results for input(s): "CKTOTAL", "CKMB", "CKMBINDEX", "TROPONINI" in the last 168 hours.  BNP (last 3 results) No results for input(s): "PROBNP" in the last 8760 hours. HbA1C: No results for input(s): "HGBA1C" in the last 72 hours. CBG: No results for input(s): "GLUCAP" in the last 168 hours.  Lipid Profile: No results for input(s): "CHOL", "HDL", "LDLCALC", "TRIG", "CHOLHDL", "LDLDIRECT" in the last 72 hours. Thyroid  Function Tests: No results for input(s): "TSH", "T4TOTAL", "FREET4", "T3FREE", "THYROIDAB" in the last 72 hours.  Anemia Panel: No results for input(s): "VITAMINB12", "FOLATE", "FERRITIN", "TIBC", "IRON", "RETICCTPCT" in the last 72 hours. Sepsis Labs: No results for input(s): "PROCALCITON", "LATICACIDVEN" in the last 168 hours.   Recent Results (from the past 240 hours)  Culture, blood (routine x 2)     Status: None   Collection Time: 11/23/23  5:50 AM   Specimen: BLOOD  Result Value Ref Range Status   Specimen Description BLOOD RIGHT ARM  Final   Special Requests   Final    BOTTLES DRAWN AEROBIC AND ANAEROBIC Blood Culture results may not be optimal due to an inadequate volume of blood received in culture bottles   Culture   Final    NO GROWTH 5 DAYS Performed at [facility], [address] [place], [place] [address]    Report Status 11/28/2023 FINAL  Final  Culture, blood (routine x 2)     Status: None   Collection Time: 11/23/23  5:50 AM   Specimen: BLOOD  Result Value Ref Range Status   Specimen Description BLOOD LEFT ARM  Final   Special Requests   Final    BOTTLES DRAWN AEROBIC AND ANAEROBIC Blood Culture results may not be optimal due to an inadequate volume of blood received in culture bottles   Culture   Final    NO GROWTH 5 DAYS Performed at [facility], [address] [place], [place] [address]    Report  Status 11/28/2023 FINAL  Final  Resp panel by RT-PCR (RSV, Flu A&B, Covid) Anterior Nasal Swab     Status: None   Collection Time: 11/23/23  5:50 AM   Specimen: Anterior Nasal Swab  Result Value Ref Range Status   SARS Coronavirus 2 by RT PCR NEGATIVE NEGATIVE Final    Comment: (NOTE) SARS-CoV-2 target nucleic acids are NOT DETECTED.  The SARS-CoV-2 RNA is generally detectable in upper respiratory specimens during the acute phase of infection. The lowest concentration of SARS-CoV-2 viral copies this assay can detect is 138 copies/mL. A negative result does not preclude SARS-Cov-2 infection and should not be used as the sole basis for treatment or other patient management decisions. A negative result may occur with  improper specimen collection/handling, submission of specimen  other than nasopharyngeal swab, presence of viral mutation(s) within the areas targeted by this assay, and inadequate number of viral copies(<138 copies/mL). A negative result must be combined with clinical observations, patient history, and epidemiological information. The expected result is Negative.  Fact Sheet for Patients:  BloggerCourse.com  Fact Sheet for Healthcare Providers:  SeriousBroker.it  This test is no t yet approved or cleared by the [place]  FDA and  has been authorized for detection and/or diagnosis of SARS-CoV-2 by FDA under an Emergency Use Authorization (EUA). This EUA will remain  in effect (meaning this test can be used) for the duration of the COVID-19 declaration under Section 564(b)(1) of the Act, 21 U.S.C.section 360bbb-3(b)(1), unless the authorization is terminated  or revoked sooner.       Influenza A by PCR NEGATIVE NEGATIVE Final   Influenza B by PCR NEGATIVE NEGATIVE Final    Comment: (NOTE) The Xpert Xpress SARS-CoV-2/FLU/RSV plus assay is intended as an aid in the diagnosis of influenza from Nasopharyngeal swab  specimens and should not be used as a sole basis for treatment. Nasal washings and aspirates are unacceptable for Xpert Xpress SARS-CoV-2/FLU/RSV testing.  Fact Sheet for Patients: BloggerCourse.com  Fact Sheet for Healthcare Providers: SeriousBroker.it  This test is not yet approved or cleared by the [place]  FDA and has been authorized for detection and/or diagnosis of SARS-CoV-2 by FDA under an Emergency Use Authorization (EUA). This EUA will remain in effect (meaning this test can be used) for the duration of the COVID-19 declaration under Section 564(b)(1) of the Act, 21 U.S.C. section 360bbb-3(b)(1), unless the authorization is terminated or revoked.     Resp Syncytial Virus by PCR NEGATIVE NEGATIVE Final    Comment: (NOTE) Fact Sheet for Patients: BloggerCourse.com  Fact Sheet for Healthcare Providers: SeriousBroker.it  This test is not yet approved or cleared by the [place]  FDA and has been authorized for detection and/or diagnosis of SARS-CoV-2 by FDA under an Emergency Use Authorization (EUA). This EUA will remain in effect (meaning this test can be used) for the duration of the COVID-19 declaration under Section 564(b)(1) of the Act, 21 U.S.C. section 360bbb-3(b)(1), unless the authorization is terminated or revoked.  Performed at [facility], [address] [place], [place] [address]   Urine Culture     Status: Abnormal   Collection Time: 11/23/23  6:50 AM   Specimen: Urine, Random  Result Value Ref Range Status   Specimen Description   Final    URINE, RANDOM Performed at [facility], [address] [place], [place] [address]    Special Requests   Final    NONE Reflexed from [id number] Performed at [facility], [address] [place], [place] [address]    Culture 80,000 COLONIES/mL STAPHYLOCOCCUS HAEMOLYTICUS (A)   Final   Report Status 11/25/2023 FINAL  Final   Organism ID, Bacteria STAPHYLOCOCCUS HAEMOLYTICUS (A)  Final      Susceptibility   Staphylococcus haemolyticus - MIC*    CIPROFLOXACIN  >=8 RESISTANT Resistant     GENTAMICIN  8 INTERMEDIATE Intermediate     NITROFURANTOIN 64 INTERMEDIATE Intermediate     OXACILLIN >=4 RESISTANT Resistant     TETRACYCLINE <=1 SENSITIVE Sensitive     VANCOMYCIN  1 SENSITIVE Sensitive     TRIMETH/SULFA <=10 SENSITIVE Sensitive     RIFAMPIN <=0.5 SENSITIVE Sensitive     Inducible Clindamycin POSITIVE Resistant     * 80,000 COLONIES/mL STAPHYLOCOCCUS HAEMOLYTICUS  MRSA Next Gen by PCR, Nasal  Status: None   Collection Time: 11/23/23  1:56 PM   Specimen: Nasal Mucosa; Nasal Swab  Result Value Ref Range Status   MRSA by PCR Next Gen NOT DETECTED NOT DETECTED Final    Comment: (NOTE) The GeneXpert MRSA Assay (FDA approved for NASAL specimens only), is one component of a comprehensive MRSA colonization surveillance program. It is not intended to diagnose MRSA infection nor to guide or monitor treatment for MRSA infections. Test performance is not FDA approved in patients less than 97 years old. Performed at [facility], [address], [place], [place] [address]          Radiology Studies: No results found.       Scheduled Meds:  amLODipine   2.5 mg Oral Daily   aspirin   81 mg Oral Daily   atorvastatin   20 mg Oral QHS   clonazepam   1 mg Oral BID   cyanocobalamin   1,000 mcg Intramuscular Q30 days   enoxaparin  (LOVENOX ) injection  40 mg Subcutaneous Q24H   ferrous sulfate   325 mg Oral Q breakfast   furosemide   40 mg Oral Daily   levothyroxine   75 mcg Oral q morning   losartan   50 mg Oral Daily   OLANZapine   5 mg Oral TID   pantoprazole   40 mg Oral Daily   polyethylene glycol  17 g Oral Daily   senna-docusate  1 tablet Oral BID   sertraline   100 mg Oral Daily   Continuous Infusions:   LOS: 7 days     Tiajuana Fluke,  MD [facility]   If 7PM-7AM, please contact night-coverage  11/30/2023, 12:55 PM

## 2023-11-30 NOTE — Progress Notes (Signed)
 Occupational Therapy Treatment Patient Details Name: Hunter [name] MRN: [id number] DOB: [birth year] Today's Date: 11/30/2023   History of present illness Pt is an 89 y/o M admitted on 08/24/23 under IVC for aggression with staff & behavioral challenges (per psych, due to baseline dementia) at his facility. PMH: chronic LBP, CKD 3, essential HTN, HLD, L BBB, major depression, neuropathy, PVD.  Course complicated by hypothermia; transferred to med-surg floor (11/23/23) for management of SIRS, unknown etiology.   OT comments  Pt received in bed, restless, mittens in place. Pt maintains eyes closed throughout session, does not respond to name or follow any commands. Intermittent growling at OT/nursing staff with attempts to encourage alertness and PO intake. Decreased responsiveness to external stimuli, does not attempt to drink from straw when provided with preferred drink (milk), TOTAL A to wash face bed level, +3 assist to adjust mitts due to agitation with pt attempting to hit care team. Secure chat to MD with concerns regarding decreased PO intake. OT will continue to attempt engagement in meaningful task performance, and consider signing off if pt unable to participate due to end stage dementia. Recommending involvement from palliative care for quality of life considerations.       If plan is discharge home, recommend the following:  Supervision due to cognitive status;Assist for transportation;Assistance with cooking/housework;Help with stairs or ramp for entrance;Two people to help with walking and/or transfers;Direct supervision/assist for financial management;Direct supervision/assist for medications management;Two people to help with bathing/dressing/bathroom   Equipment Recommendations  Other (comment)       Precautions / Restrictions Precautions Precautions: Fall Recall of Precautions/Restrictions: Impaired Precaution/Restrictions Comments: hx of restlessness, agitation and aggression.  however cooperative, & pleasant with therapy Restrictions Weight Bearing Restrictions Per Provider Order: No       Mobility Bed Mobility Overal bed mobility: Needs Assistance             General bed mobility comments: NT. Pt agitated, attempting to hit staff upon repositioning attempts. +3 to  adjust bilat mittens as pt is removing / biting. OT assists RN with placing foam padding on heels for pressure sore protection    Transfers                   General transfer comment: unsafe to attempt     Balance Overall balance assessment: Needs assistance     Sitting balance - Comments: unsafe to attempt                                   ADL either performed or assessed with clinical judgement   ADL Overall ADL's : Needs assistance/impaired Eating/Feeding: Bed level;Total assistance Eating/Feeding Details (indicate cue type and reason): pt with decreased interaction and responsivenes. does not open eyes, does not engage purposefuly at all. OT attempts to offer pt milk through straw (his preferred drink), pt closing jaw and growling in agitation. later in session, pt with eyes briefly open and OT attempts again, using straw place a drop of milk on tounge. Pt makes no attempt to swallow or move tounge.                                   General ADL Comments: TOTAL +5 for ADLs required per nursing staff as pt has been agitated. discussed use of briefs to help decrease agitation when performing pericare  Communication Communication Communication: Impaired Factors Affecting Communication: Hearing impaired;Reduced clarity of speech;Difficulty expressing self (pt does not verbalize. growls at times in agitation, does not respond to name.)   Cognition Arousal: Obtunded, Lethargic Behavior During Therapy: Restless, Flat affect Cognition: History of cognitive impairments, Cognition impaired             OT - Cognition Comments: lethargic and  decreased responses this date; pt keeping eyes closed throughout session. does not respond to external stimuli on lip to encourge PO intake. attempting to hit staff and therapist.                 Following commands: Impaired Following commands impaired: Follows one step commands inconsistently (does not follow any commands)      Cueing   Cueing Techniques: Verbal cues, Tactile cues        General Comments Secure chat to MD regarding pt's status and decreased PO intake    Pertinent Vitals/ Pain       Pain Assessment Pain Assessment: No/denies pain Breathing: normal Negative Vocalization: none Facial Expression: smiling or inexpressive Body Language: relaxed Consolability: no need to console PAINAD Score: 0         Frequency  Min 2X/week        Progress Toward Goals  OT Goals(current goals can now be found in the care plan section)  Progress towards OT goals: OT to reassess next treatment  Acute Rehab OT Goals OT Goal Formulation: Patient unable to participate in goal setting Time For Goal Achievement: 12/16/23 Potential to Achieve Goals: Poor ADL Goals Pt Will Perform Grooming: sitting;with contact guard assist Pt Will Perform Upper Body Bathing: sitting;with contact guard assist Pt Will Perform Lower Body Bathing: with mod assist;bed level Pt Will Perform Upper Body Dressing: with min assist;sitting Pt Will Perform Lower Body Dressing: with min assist;sit to/from stand Pt Will Transfer to Toilet: with +2 assist;squat pivot transfer;with mod assist Pt Will Perform Toileting - Clothing Manipulation and hygiene: with max assist;sitting/lateral leans;bed level  Plan         AM-PAC OT "6 Clicks" Daily Activity     Outcome Measure   Help from another person eating meals?: Total Help from another person taking care of personal grooming?: Total Help from another person toileting, which includes using toliet, bedpan, or urinal?: Total Help from another person  bathing (including washing, rinsing, drying)?: Total Help from another person to put on and taking off regular upper body clothing?: Total Help from another person to put on and taking off regular lower body clothing?: Total 6 Click Score: 6    End of Session    OT Visit Diagnosis: Unsteadiness on feet (R26.81);Repeated falls (R29.6);Muscle weakness (generalized) (M62.81);Other symptoms and signs involving cognitive function   Activity Tolerance Treatment limited secondary to agitation   Patient Left in bed;with call bell/phone within reach;with bed alarm set   Nurse Communication Other (comment) (PO intake)        Time: 7829-5621 OT Time Calculation (min): 30 min  Charges: OT General Charges $OT Visit: 1 Visit OT Treatments $Self Care/Home Management : 23-37 mins  Robertson Colclough L. Juanisha Bautch, OTR/L  11/30/23, 4:38 PM

## 2023-11-30 NOTE — Plan of Care (Signed)
  Problem: Health Behavior/Discharge Planning: Goal: Ability to manage health-related needs will improve 11/30/2023 0403 by Ian Maine, RN Outcome: Progressing 11/30/2023 0403 by Ian Maine, RN Outcome: Progressing   Problem: Clinical Measurements: Goal: Ability to maintain clinical measurements within normal limits will improve 11/30/2023 0403 by Ian Maine, RN Outcome: Progressing 11/30/2023 0403 by Ian Maine, RN Outcome: Progressing   Problem: Activity: Goal: Risk for activity intolerance will decrease 11/30/2023 0403 by Ian Maine, RN Outcome: Progressing 11/30/2023 0403 by Ian Maine, RN Outcome: Progressing   Problem: Nutrition: Goal: Adequate nutrition will be maintained 11/30/2023 0403 by Ian Maine, RN Outcome: Progressing 11/30/2023 0403 by Ian Maine, RN Outcome: Progressing

## 2023-12-01 ENCOUNTER — Inpatient Hospital Stay

## 2023-12-01 DIAGNOSIS — D696 Thrombocytopenia, unspecified: Secondary | ICD-10-CM

## 2023-12-01 DIAGNOSIS — T68XXXS Hypothermia, sequela: Secondary | ICD-10-CM

## 2023-12-01 DIAGNOSIS — E782 Mixed hyperlipidemia: Secondary | ICD-10-CM

## 2023-12-01 DIAGNOSIS — N1832 Chronic kidney disease, stage 3b: Secondary | ICD-10-CM

## 2023-12-01 DIAGNOSIS — F03918 Unspecified dementia, unspecified severity, with other behavioral disturbance: Secondary | ICD-10-CM | POA: Diagnosis not present

## 2023-12-01 DIAGNOSIS — I442 Atrioventricular block, complete: Secondary | ICD-10-CM

## 2023-12-01 DIAGNOSIS — E039 Hypothyroidism, unspecified: Secondary | ICD-10-CM

## 2023-12-01 DIAGNOSIS — R627 Adult failure to thrive: Secondary | ICD-10-CM | POA: Diagnosis not present

## 2023-12-01 DIAGNOSIS — I5032 Chronic diastolic (congestive) heart failure: Secondary | ICD-10-CM

## 2023-12-01 MED ORDER — SODIUM CHLORIDE 0.9 % IV BOLUS
500.0000 mL | Freq: Once | INTRAVENOUS | Status: AC
  Administered 2023-12-01: 500 mL via INTRAVENOUS

## 2023-12-01 MED ORDER — DEXTROSE-SODIUM CHLORIDE 5-0.9 % IV SOLN
INTRAVENOUS | Status: DC
Start: 2023-12-01 — End: 2023-12-02

## 2023-12-01 NOTE — Progress Notes (Signed)
 Patient remains  agitated, combative, non complaint with meds and spitting. Attempted throughout the shift for oral intake and meals, still refusing and spitting.Reassurance provided. MD aware. Patient had low BP this am, notified MD and ordered IV fluids. Continue to monitor.

## 2023-12-01 NOTE — Assessment & Plan Note (Signed)
Present on admission see full description below. 

## 2023-12-01 NOTE — Assessment & Plan Note (Addendum)
 Patient failed swallow evaluation 2 days ago.

## 2023-12-01 NOTE — Plan of Care (Signed)
  Problem: Clinical Measurements: Goal: Ability to maintain clinical measurements within normal limits will improve Outcome: Progressing   Problem: Activity: Goal: Risk for activity intolerance will decrease Outcome: Progressing   Problem: Nutrition: Goal: Adequate nutrition will be maintained Outcome: Progressing   Problem: Coping: Goal: Level of anxiety will decrease Outcome: Progressing   Problem: Safety: Goal: Ability to remain free from injury will improve Outcome: Progressing   Problem: Skin Integrity: Goal: Risk for impaired skin integrity will decrease Outcome: Progressing

## 2023-12-01 NOTE — Progress Notes (Signed)
 Progress Note   Patient: Hunter [name] WFU:[id number] DOB: 04-13-1934 DOA: 08/24/2023     8 DOS: the patient was seen and examined on 12/01/2023   Brief hospital course: 89 y.o. male with medical history significant of hypertension, chronic HFpEF, hyperlipidemia, complete bundle branch block status post pacemaker, stage III CKD, dementia currently in ED holding.  Requesting admission for hypothermia as well as lethargy.  Per report, patient has been a longstanding boarding patient in the ER for psychiatric issues including end-stage dementia and agitation.  Per report, patient with worsening lethargy over the course of the night.  This is a fairly new finding though patient has significant agitation as well as secondary lethargy after antipsychotic regimen.  Patient noted had a rectal temp around 95-96 on evaluation by nursing.  No reports of cough, shortness of breath.  No reports of chest pain.  No abdominal pain or reported diarrhea.  No reported falls.  Systolic pressures have been fairly stable though with pressures dropping into the 90s overnight.  Noted chronic small sacral pressure ulcer.  Psychiatry noted have been consulted on May 9 for worsening agitation in setting of end-stage dementia.  Medications including Depakote  and Zyprexa  ordered for psychiatric management. Overnight in the ER with a Tmax of 95.5, heart rate 50s to 70s.  Respirations into the 20s.  Blood pressure 90s to 110s over 40s to 50s.  Satting well on room air.  White count 5.1, hemoglobin 14.5, platelets 100, creatinine 1.53.  LFTs and T. bili grossly within normal limits.  Urinalysis not indicative of infection.  Chest x-ray and CT head grossly within normal limits.  CT abdomen pelvis with initial concern for gallbladder pathology however ruled out.  General surgery consultation or requested and appreciated.  No surgical intervention warranted.  General surgery has signed off as of 5/14.  LFTs have normalized.   5/14: Patient  remain as agitated, dangerous behavior towards staff.  5/21.  Called by nursing staff that his blood pressure is low.  He is spitting out medications and swelling at the nursing staff.  Would not take anything oral even food.  Spoke with patient's guardian and he will come visit tomorrow to assess.  He needs a lot of paperwork and needs to present case to his supervisor in order to get a DNR.  Assessment and Plan: * Failure to thrive in adult Patient not eating and spitting out medications.  Patient has poor quality of life.  Patient has been here 99 days and has been very agitated during the hospital course.  Spoke with patient's guardian and recommended a DO NOT RESUSCITATE.  If he does not eat or drink in 7 days he will pass away.  I ordered for an IV to be placed but with severe agitation this may be difficult to do.  Gentle IV fluids if we are able to get an IV.  Patient has a very poor quality of life with a functional decline.  Aggressive behavior due to dementia [facility]) Patient today would not take any medications or food.  We are left with IM medications currently.  Current treatments increasing morbidity and mortality.  Hypothermia Borderline SIRS No evidence of sepsis.  Heart failure with preserved ejection fraction (HFpEF)  2D echo May 2024 with EF of 50 to 55% and grade 1 diastolic dysfunction.  Gentle IV fluids ordered  Pressure injury of skin Present on admission see full description below  Dementia (HCC) Baseline agitation in setting of dementia   Complete heart  block (HCC) Status post pacemaker  Thrombocytopenia (HCC) Chronic in nature  CKD (chronic kidney disease) CKD stage IIIb.  Gentle fluids if able to get an IV.  GERD (gastroesophageal reflux disease) PPI if able to take  Essential hypertension Blood pressure on the lower side today.  Hold all antihypertensive medication  Mixed hyperlipidemia Hold Lipitor  Acquired hypothyroidism Continue Synthroid  if  able to take.  Last TSH normal range.        Subjective: Patient not being able to provide any history.  Here 99 days with altered mental status.  Physical Exam: Vitals:   11/30/23 1927 12/01/23 0523 12/01/23 0801 12/01/23 1144  BP: (!) 102/54 (!) 97/53 94/63 (!) 93/57  Pulse: 74 70 70 74  Resp: 16 17 16    Temp: (!) 97.5 F (36.4 C) 98 F (36.7 C)    TempSrc:      SpO2: 93%  100%   Weight:      Height:       Physical Exam HENT:     Head: Normocephalic.     Mouth/Throat:     Pharynx: No oropharyngeal exudate.  Eyes:     General: Lids are normal.     Conjunctiva/sclera: Conjunctivae normal.  Cardiovascular:     Rate and Rhythm: Normal rate and regular rhythm.     Heart sounds: Normal heart sounds, S1 normal and S2 normal.  Pulmonary:     Breath sounds: No decreased breath sounds, wheezing, rhonchi or rales.  Abdominal:     Palpations: Abdomen is soft.     Tenderness: There is no abdominal tenderness.  Musculoskeletal:     Right lower leg: No swelling.     Left lower leg: No swelling.  Skin:    General: Skin is warm.     Findings: No rash.  Neurological:     Mental Status: He is lethargic.     Comments: Moving his arms and legs.  Kept eyes closed the entire time.     Data Reviewed: Last creatinine 1.62, CO2 20, white blood count 12.8, platelet count 97, hemoglobin 15.1  Family Communication: Spoke with guardian on the phone  Disposition: Status is: Inpatient Remains inpatient appropriate because: We do not have any placement options.  With him not eating or drinking at this point or taking medications his overall prognosis is very poor.  High likelihood of cardiopulmonary arrest.  Suggested DNR.  Planned Discharge Destination: We do not have any disposition plans at this point    Time spent: 32 minutes  Author: Verla Glaze, MD 12/01/2023 1:00 PM  For on call review [email or web address].

## 2023-12-01 NOTE — Assessment & Plan Note (Signed)
Chronic in nature.

## 2023-12-01 NOTE — Progress Notes (Signed)
 Physical Therapy Treatment Patient Details Name: ORBIE [name] MRN: [id number] DOB: 03-Oct-1933 Today's Date: 12/01/2023   History of Present Illness Pt is an 89 y/o M admitted on 08/24/23 under IVC for aggression with staff & behavioral challenges (per psych, due to baseline dementia) at his facility. PMH: chronic LBP, CKD 3, essential HTN, HLD, L BBB, major depression, neuropathy, PVD.  Course complicated by hypothermia; transferred to med-surg floor (11/23/23) for management of SIRS, unknown etiology.    PT Comments  PT/OT session performed this date. Pt initially calm, received in B mitts. Occasional "yes" or "hi" but unable to carry conversation. Unable to take bite of applesauce provided. With rolling, noted pt soiled in bed. Therapist + multiple other staff required as pt very resistant to rolling/mobility and bed change. Pt with severe agitation with biting/hitting. Not appropriate for skilled intervention and is not making progress towards goals. Appears at end stage dementia. Concerns relayed to care team who plan to escalate to legal guardian. Will try for 1 more session, then will need to sign off if pt unable to participate.    If plan is discharge home, recommend the following: Assistance with cooking/housework;Direct supervision/assist for medications management;Assist for transportation;Help with stairs or ramp for entrance;Supervision due to cognitive status;Two people to help with walking and/or transfers;Two people to help with bathing/dressing/bathroom   Can travel by private vehicle     No  Equipment Recommendations  Rolling walker (2 wheels);Wheelchair (measurements PT);Wheelchair cushion (measurements PT);BSC/3in1;Hospital bed    Recommendations for Other Services       Precautions / Restrictions Precautions Precautions: Fall Recall of Precautions/Restrictions: Impaired Precaution/Restrictions Comments: agitated with mobility, mitts and tries to  bite Restrictions Weight Bearing Restrictions Per Provider Order: No     Mobility  Bed Mobility Overal bed mobility: Needs Assistance Bed Mobility: Rolling Rolling: Total assist, +2 for physical assistance (+5 for assistance)         General bed mobility comments: pt noted to be soiled in bed. Needs help for rolling for full bed change and hygiene. Pt very agitated, tries to bite/swing/hit staff and is resistant to movement. Calming techniques including i[employer] statements, firm touch and reassurance given. Further mobility deferred at this time    Transfers                   General transfer comment: unsafe to attempt    Ambulation/Gait                   Stairs             Wheelchair Mobility     Tilt Bed    Modified Rankin (Stroke Patients Only)       Balance                                            Communication Communication Communication: Impaired Factors Affecting Communication: Hearing impaired;Reduced clarity of speech;Difficulty expressing self  Cognition Arousal:  (agitated) Behavior During Therapy: Restless, Flat affect   PT - Cognitive impairments: History of cognitive impairments   Orientation impairments: Place, Time, Situation                   PT - Cognition Comments: initially calm, however becomes increasingly agitated with movement Following commands: Impaired Following commands impaired:  (unable to follow commands)    Cueing Cueing Techniques: Verbal cues,  Gestural cues, Tactile cues, Visual cues  Exercises Other Exercises Other Exercises: attempted to feed a bite of applesauce. Pt unable to coordinate movements of lips. Very minimal if any intake of PO x several days    General Comments        Pertinent Vitals/Pain Pain Assessment Pain Assessment: No/denies pain    Home Living                          Prior Function            PT Goals (current goals can now be  found in the care plan section) Acute Rehab PT Goals Patient Stated Goal: none stated PT Goal Formulation: Patient unable to participate in goal setting Time For Goal Achievement: 12/23/23 Potential to Achieve Goals: Poor Progress towards PT goals: Not progressing toward goals - comment    Frequency    Min 1X/week      PT Plan      Co-evaluation PT/OT/SLP Co-Evaluation/Treatment: Yes Reason for Co-Treatment: Complexity of the patient's impairments (multi-system involvement);For patient/therapist safety PT goals addressed during session: Mobility/safety with mobility;Strengthening/ROM OT goals addressed during session: ADL's and self-care;Strengthening/ROM      AM-PAC PT "6 Clicks" Mobility   Outcome Measure  Help needed turning from your back to your side while in a flat bed without using bedrails?: Total Help needed moving from lying on your back to sitting on the side of a flat bed without using bedrails?: Total Help needed moving to and from a bed to a chair (including a wheelchair)?: Total Help needed standing up from a chair using your arms (e.g., wheelchair or bedside chair)?: Total Help needed to walk in hospital room?: Total Help needed climbing 3-5 steps with a railing? : Total 6 Click Score: 6    End of Session   Activity Tolerance: Treatment limited secondary to agitation Patient left: in bed;with bed alarm set;with call bell/phone within reach Nurse Communication: Mobility status PT Visit Diagnosis: Unsteadiness on feet (R26.81);Muscle weakness (generalized) (M62.81);Other abnormalities of gait and mobility (R26.89);Difficulty in walking, not elsewhere classified (R26.2);History of falling (Z91.81)     Time: 1610-9604 PT Time Calculation (min) (ACUTE ONLY): 28 min  Charges:    $Therapeutic Activity: 8-22 mins PT General Charges $$ ACUTE PT VISIT: 1 Visit                     Amparo Balk, PT, DPT, [facility] [phone]    Shaconda Hajduk 12/01/2023,  3:02 PM

## 2023-12-01 NOTE — Hospital Course (Addendum)
 89 y.o. male with medical history significant of hypertension, chronic HFpEF, hyperlipidemia, complete bundle branch block status post pacemaker, stage III CKD, dementia currently in ED holding.  Requesting admission for hypothermia as well as lethargy.  Per report, patient has been a longstanding boarding patient in the ER for psychiatric issues including end-stage dementia and agitation.  Per report, patient with worsening lethargy over the course of the night.  This is a fairly new finding though patient has significant agitation as well as secondary lethargy after antipsychotic regimen.  Patient noted had a rectal temp around 95-96 on evaluation by nursing.  No reports of cough, shortness of breath.  No reports of chest pain.  No abdominal pain or reported diarrhea.  No reported falls.  Systolic pressures have been fairly stable though with pressures dropping into the 90s overnight.  Noted chronic small sacral pressure ulcer.  Psychiatry noted have been consulted on May 9 for worsening agitation in setting of end-stage dementia.  Medications including Depakote  and Zyprexa  ordered for psychiatric management. Overnight in the ER with a Tmax of 95.5, heart rate 50s to 70s.  Respirations into the 20s.  Blood pressure 90s to 110s over 40s to 50s.  Satting well on room air.  White count 5.1, hemoglobin 14.5, platelets 100, creatinine 1.53.  LFTs and T. bili grossly within normal limits.  Urinalysis not indicative of infection.  Chest x-ray and CT head grossly within normal limits.  CT abdomen pelvis with initial concern for gallbladder pathology however ruled out.  General surgery consultation or requested and appreciated.  No surgical intervention warranted.  General surgery has signed off as of 5/14.  LFTs have normalized.   5/14: Patient remain as agitated, dangerous behavior towards staff.  5/21.  Called by nursing staff that his blood pressure is low.  He is spitting out medications and swelling at the  nursing staff.  Would not take anything oral even food.  Spoke with patient's guardian and he will come visit tomorrow to assess.  He needs a lot of paperwork and needs to present case to his supervisor in order to get a DNR. 5/22.  Sodium up at 164, creatinine 5.16.  Fluid bolus and IV fluids switched over to D5W.  Overall prognosis is poor.  Patient made comfort care measures and DNR on the evening of 5/22. 5/23.  Continue comfort care measures. Dec 05, 2023.  Patient passed away at 6:56 AM.

## 2023-12-01 NOTE — Progress Notes (Signed)
 Occupational Therapy Treatment Patient Details Name: Hunter [name] MRN: [id number] DOB: 11/19/1933 Today's Date: 12/01/2023   History of present illness Pt is an 89 y/o M admitted on 08/24/23 under IVC for aggression with staff & behavioral challenges (per psych, due to baseline dementia) at his facility. PMH: chronic LBP, CKD 3, essential HTN, HLD, L BBB, major depression, neuropathy, PVD.  Course complicated by hypothermia; transferred to med-surg floor (11/23/23) for management of SIRS, unknown etiology.   OT comments  Pt seen with PT to maximize functional outcomes and for pt/therapist safety. Pt initially calm, slightly restless, increased verbalizations "yes" from yesterday but maintains eyes closed. OT offers pt small amount of applesauce on lip, pt unable to recognize/respond to external stimuli. BM noted upon rolling, ultimately pt requires +5 assist for pericare, linen change and donning clean brief/gown. Pt agitated, attempting to swing/hit/bite team members throughout. Secure chat sent to care team with concerns for decreased PO intake - plan is to escalate to legal guardian and palliative consult placed. Pt not making progress towards goals, and is limited by dementia to participate in skilled therapy. Will attempt 1 more session and sign off.       If plan is discharge home, recommend the following:  Supervision due to cognitive status;Assist for transportation;Assistance with cooking/housework;Help with stairs or ramp for entrance;Two people to help with walking and/or transfers;Direct supervision/assist for financial management;Direct supervision/assist for medications management;Two people to help with bathing/dressing/bathroom   Equipment Recommendations  Other (comment)       Precautions / Restrictions Precautions Precautions: Fall Recall of Precautions/Restrictions: Impaired Precaution/Restrictions Comments: agitated with mobility, mitts and tries to bite Restrictions Weight  Bearing Restrictions Per Provider Order: No       Mobility Bed Mobility Overal bed mobility: Needs Assistance Bed Mobility: Rolling Rolling: Total assist, +2 for physical assistance (+5 for safety due to pt agitated and biting, resistive)              Transfers Overall transfer level: Needs assistance                 General transfer comment: unsafe to attempt     Balance                                           ADL either performed or assessed with clinical judgement   ADL Overall ADL's : Needs assistance/impaired Eating/Feeding: Bed level;Total assistance               Upper Body Dressing : Total assistance;Bed level   Lower Body Dressing: Bed level;+2 for safety/equipment;+2 for physical assistance;Total assistance       Toileting- Clothing Manipulation and Hygiene: Total assistance;+2 for physical assistance;+2 for safety/equipment;Bed level Toileting - Clothing Manipulation Details (indicate cue type and reason): +5 for pericare bed level TOTAL A       General ADL Comments: TOTAL A +5 for linen change, donning brief and pericare. Calming techniques used but pt not responsive     Communication Communication Communication: Impaired Factors Affecting Communication: Hearing impaired;Reduced clarity of speech;Difficulty expressing self   Cognition Arousal: Obtunded Behavior During Therapy: Restless, Agitated, Flat affect Cognition: History of cognitive impairments, Cognition impaired             OT - Cognition Comments: lethargic and decreased responses this date; pt keeping eyes closed throughout session. does not respond to external stimuli  on lip to encourge PO intake. attempting to hit/bite.                 Following commands: Impaired Following commands impaired:  (does not follow any commands or directions)      Cueing   Cueing Techniques: Verbal cues, Gestural cues, Tactile cues, Visual cues         General Comments Secure chat to care team regarding concerns of decreased PO intake, potential need for pallative involvement    Pertinent Vitals/ Pain       Pain Assessment Pain Assessment: PAINAD Pain Score: 0-No pain Faces Pain Scale: No hurt Breathing: normal Negative Vocalization: none Facial Expression: smiling or inexpressive Body Language: relaxed Consolability: no need to console PAINAD Score: 0   Frequency  Min 2X/week        Progress Toward Goals  OT Goals(current goals can now be found in the care plan section)  Progress towards OT goals: Not progressing toward goals - comment (will attempt 1x more time)  Acute Rehab OT Goals OT Goal Formulation: Patient unable to participate in goal setting Time For Goal Achievement: 12/16/23 Potential to Achieve Goals: Poor ADL Goals Pt Will Perform Grooming: sitting;with contact guard assist Pt Will Perform Upper Body Bathing: sitting;with contact guard assist Pt Will Perform Lower Body Bathing: with mod assist;bed level Pt Will Perform Upper Body Dressing: with min assist;sitting Pt Will Perform Lower Body Dressing: with min assist;sit to/from stand Pt Will Transfer to Toilet: with +2 assist;squat pivot transfer;with mod assist Pt Will Perform Toileting - Clothing Manipulation and hygiene: with max assist;sitting/lateral leans;bed level  Plan      Co-evaluation    PT/OT/SLP Co-Evaluation/Treatment: Yes Reason for Co-Treatment: Complexity of the patient's impairments (multi-system involvement);For patient/therapist safety PT goals addressed during session: Mobility/safety with mobility;Strengthening/ROM OT goals addressed during session: ADL's and self-care;Strengthening/ROM      AM-PAC OT "6 Clicks" Daily Activity     Outcome Measure   Help from another person eating meals?: Total Help from another person taking care of personal grooming?: Total Help from another person toileting, which includes using toliet,  bedpan, or urinal?: Total Help from another person bathing (including washing, rinsing, drying)?: Total Help from another person to put on and taking off regular upper body clothing?: Total Help from another person to put on and taking off regular lower body clothing?: Total 6 Click Score: 6    End of Session Equipment Utilized During Treatment:  (mitts)  OT Visit Diagnosis: Unsteadiness on feet (R26.81);Repeated falls (R29.6);Muscle weakness (generalized) (M62.81);Other symptoms and signs involving cognitive function   Activity Tolerance Treatment limited secondary to agitation   Patient Left in bed;with call bell/phone within reach;with bed alarm set;with nursing/sitter in room (mitts on)   Nurse Communication Other (comment) (questionable PO intake)        Time: 1610-9604 OT Time Calculation (min): 26 min  Charges: OT General Charges $OT Visit: 1 Visit OT Treatments $Self Care/Home Management : 8-22 mins  Adilynne Fitzwater L. Arlin Sass, OTR/L  12/01/23, 5:12 PM

## 2023-12-01 NOTE — Progress Notes (Signed)
 Psychiatry called legal guardian to discuss their decision on palliative care, due to poor oral intake, end stage dementia, ongoing agitation requiring multiple doses of benzos, antipsychotics with no quality of life. It went to voicemail and HIPAA complaint voicemail is left.

## 2023-12-01 NOTE — Assessment & Plan Note (Addendum)
 Patient has a very poor quality of life with a functional decline.  Patient failed swallow evaluation 2 days ago.

## 2023-12-02 ENCOUNTER — Ambulatory Visit: Payer: Medicare Other

## 2023-12-02 DIAGNOSIS — F03918 Unspecified dementia, unspecified severity, with other behavioral disturbance: Secondary | ICD-10-CM | POA: Diagnosis not present

## 2023-12-02 DIAGNOSIS — N189 Chronic kidney disease, unspecified: Secondary | ICD-10-CM

## 2023-12-02 DIAGNOSIS — E870 Hyperosmolality and hypernatremia: Secondary | ICD-10-CM

## 2023-12-02 DIAGNOSIS — N179 Acute kidney failure, unspecified: Secondary | ICD-10-CM

## 2023-12-02 DIAGNOSIS — N185 Chronic kidney disease, stage 5: Secondary | ICD-10-CM

## 2023-12-02 DIAGNOSIS — R4689 Other symptoms and signs involving appearance and behavior: Secondary | ICD-10-CM | POA: Diagnosis not present

## 2023-12-02 DIAGNOSIS — T68XXXA Hypothermia, initial encounter: Secondary | ICD-10-CM | POA: Diagnosis not present

## 2023-12-02 DIAGNOSIS — R627 Adult failure to thrive: Secondary | ICD-10-CM | POA: Diagnosis not present

## 2023-12-02 DIAGNOSIS — K219 Gastro-esophageal reflux disease without esophagitis: Secondary | ICD-10-CM

## 2023-12-02 LAB — CBC
HCT: 50.1 % (ref 39.0–52.0)
Hemoglobin: 15.6 g/dL (ref 13.0–17.0)
MCH: 26.5 pg (ref 26.0–34.0)
MCHC: 31.1 g/dL (ref 30.0–36.0)
MCV: 85.1 fL (ref 80.0–100.0)
Platelets: 114 10*3/uL — ABNORMAL LOW (ref 150–400)
RBC: 5.89 MIL/uL — ABNORMAL HIGH (ref 4.22–5.81)
RDW: 16.7 % — ABNORMAL HIGH (ref 11.5–15.5)
WBC: 17.8 10*3/uL — ABNORMAL HIGH (ref 4.0–10.5)
nRBC: 0 % (ref 0.0–0.2)

## 2023-12-02 LAB — COMPREHENSIVE METABOLIC PANEL WITH GFR
ALT: 99 U/L — ABNORMAL HIGH (ref 0–44)
AST: 115 U/L — ABNORMAL HIGH (ref 15–41)
Albumin: 3.3 g/dL — ABNORMAL LOW (ref 3.5–5.0)
Alkaline Phosphatase: 74 U/L (ref 38–126)
Anion gap: 14 (ref 5–15)
BUN: 97 mg/dL — ABNORMAL HIGH (ref 8–23)
CO2: 22 mmol/L (ref 22–32)
Calcium: 8.9 mg/dL (ref 8.9–10.3)
Chloride: 128 mmol/L — ABNORMAL HIGH (ref 98–111)
Creatinine, Ser: 5.16 mg/dL — ABNORMAL HIGH (ref 0.61–1.24)
GFR, Estimated: 10 mL/min — ABNORMAL LOW (ref >60)
Glucose, Bld: 138 mg/dL — ABNORMAL HIGH (ref 70–99)
Potassium: 3.9 mmol/L (ref 3.5–5.1)
Sodium: 164 mmol/L (ref 135–145)
Total Bilirubin: 1 mg/dL (ref 0.0–1.2)
Total Protein: 5.8 g/dL — ABNORMAL LOW (ref 6.5–8.1)

## 2023-12-02 MED ORDER — GLYCOPYRROLATE 1 MG PO TABS: 1.0000 mg | ORAL_TABLET | ORAL | Status: DC | PRN

## 2023-12-02 MED ORDER — GLYCOPYRROLATE 0.2 MG/ML IJ SOLN: 0.2000 mg | INTRAMUSCULAR | Status: DC | PRN

## 2023-12-02 MED ORDER — LORAZEPAM 1 MG PO TABS: 1.0000 mg | ORAL_TABLET | ORAL | Status: DC | PRN

## 2023-12-02 MED ORDER — HYDROMORPHONE HCL 1 MG/ML IJ SOLN
0.2500 mg | INTRAMUSCULAR | Status: DC | PRN
  Administered 2023-12-03: 1 mg via INTRAVENOUS
  Filled 2023-12-02: qty 1

## 2023-12-02 MED ORDER — POLYVINYL ALCOHOL 1.4 % OP SOLN: 1.0000 [drp] | Freq: Four times a day (QID) | OPHTHALMIC | Status: DC | PRN

## 2023-12-02 MED ORDER — HALOPERIDOL LACTATE 5 MG/ML IJ SOLN
0.5000 mg | INTRAMUSCULAR | Status: DC | PRN
  Administered 2023-12-03 (×2): 0.5 mg via INTRAVENOUS
  Filled 2023-12-02 (×2): qty 1

## 2023-12-02 MED ORDER — HYDROMORPHONE HCL-NACL 50-0.9 MG/50ML-% IV SOLN
0.5000 mg/h | INTRAVENOUS | Status: DC
  Filled 2023-12-02: qty 50

## 2023-12-02 MED ORDER — DEXTROSE 5 % IV SOLN
INTRAVENOUS | Status: DC
  Filled 2023-12-02: qty 1000

## 2023-12-02 MED ORDER — HYDROMORPHONE 1 MG/ML IV SOLN
INTRAVENOUS | Status: DC
  Filled 2023-12-02: qty 30

## 2023-12-02 MED ORDER — SODIUM CHLORIDE 0.9 % IV BOLUS
500.0000 mL | Freq: Once | INTRAVENOUS | Status: AC
  Administered 2023-12-02: 500 mL via INTRAVENOUS

## 2023-12-02 MED ORDER — HYDROMORPHONE BOLUS VIA INFUSION: 0.2500 mg | INTRAVENOUS | Status: DC | PRN

## 2023-12-02 MED ORDER — LORAZEPAM 2 MG/ML IJ SOLN
1.0000 mg | INTRAMUSCULAR | Status: DC | PRN
  Administered 2023-12-03: 1 mg via INTRAVENOUS
  Filled 2023-12-02: qty 1

## 2023-12-02 MED ORDER — BIOTENE DRY MOUTH MT LIQD: 15.0000 mL | OROMUCOSAL | Status: DC | PRN

## 2023-12-02 MED ORDER — HEPARIN SODIUM (PORCINE) 5000 UNIT/ML IJ SOLN
5000.0000 [IU] | Freq: Two times a day (BID) | INTRAMUSCULAR | Status: DC
  Administered 2023-12-02: 5000 [IU] via SUBCUTANEOUS
  Filled 2023-12-02: qty 1

## 2023-12-02 MED ORDER — SODIUM CHLORIDE 0.9% FLUSH: 9.0000 mL | INTRAVENOUS | Status: DC | PRN

## 2023-12-02 MED ORDER — HALOPERIDOL 0.5 MG PO TABS: 0.5000 mg | ORAL_TABLET | ORAL | Status: DC | PRN

## 2023-12-02 MED ORDER — HALOPERIDOL LACTATE 2 MG/ML PO CONC: 0.5000 mg | ORAL | Status: DC | PRN

## 2023-12-02 MED ORDER — LORAZEPAM 2 MG/ML PO CONC: 1.0000 mg | ORAL | Status: DC | PRN

## 2023-12-02 MED ORDER — ACETAMINOPHEN 325 MG PO TABS: 650.0000 mg | ORAL_TABLET | Freq: Four times a day (QID) | ORAL | Status: DC | PRN

## 2023-12-02 MED ORDER — ACETAMINOPHEN 650 MG RE SUPP: 650.0000 mg | Freq: Four times a day (QID) | RECTAL | Status: DC | PRN

## 2023-12-02 NOTE — Progress Notes (Signed)
 Progress Note   Patient: Hunter [name] UUV:[id number] DOB: 1934-06-04 DOA: 08/24/2023     9 DOS: the patient was seen and examined on 12/02/2023   Brief hospital course: 89 y.o. male with medical history significant of hypertension, chronic HFpEF, hyperlipidemia, complete bundle branch block status post pacemaker, stage III CKD, dementia currently in ED holding.  Requesting admission for hypothermia as well as lethargy.  Per report, patient has been a longstanding boarding patient in the ER for psychiatric issues including end-stage dementia and agitation.  Per report, patient with worsening lethargy over the course of the night.  This is a fairly new finding though patient has significant agitation as well as secondary lethargy after antipsychotic regimen.  Patient noted had a rectal temp around 95-96 on evaluation by nursing.  No reports of cough, shortness of breath.  No reports of chest pain.  No abdominal pain or reported diarrhea.  No reported falls.  Systolic pressures have been fairly stable though with pressures dropping into the 90s overnight.  Noted chronic small sacral pressure ulcer.  Psychiatry noted have been consulted on May 9 for worsening agitation in setting of end-stage dementia.  Medications including Depakote  and Zyprexa  ordered for psychiatric management. Overnight in the ER with a Tmax of 95.5, heart rate 50s to 70s.  Respirations into the 20s.  Blood pressure 90s to 110s over 40s to 50s.  Satting well on room air.  White count 5.1, hemoglobin 14.5, platelets 100, creatinine 1.53.  LFTs and T. bili grossly within normal limits.  Urinalysis not indicative of infection.  Chest x-ray and CT head grossly within normal limits.  CT abdomen pelvis with initial concern for gallbladder pathology however ruled out.  General surgery consultation or requested and appreciated.  No surgical intervention warranted.  General surgery has signed off as of 5/14.  LFTs have normalized.   5/14: Patient  remain as agitated, dangerous behavior towards staff.  5/21.  Called by nursing staff that his blood pressure is low.  He is spitting out medications and swelling at the nursing staff.  Would not take anything oral even food.  Spoke with patient's guardian and he will come visit tomorrow to assess.  He needs a lot of paperwork and needs to present case to his supervisor in order to get a DNR. 5/22.  Sodium up at 164, creatinine 5.16.  Fluid bolus and IV fluids switched over to D5W.  Overall prognosis is poor.  Assessment and Plan: * Hypernatremia Sodium very elevated at 164.  This is an independent risk factor for mortality.  IV fluid bolus and switch fluids to D5W at 100 mL/h.  Appreciate palliative care consultation.  Patient failed swallow evaluation today.  Acute kidney injury superimposed on CKD (HCC) AKI on CKD stage IIIb.  Creatinine 1.62 on 5/17.  Today's creatinine 5.16.  Fluid bolus plus D5W at 100 mL/h.  Patient failed swallow evaluation today.  Failure to thrive in adult Patient has a very poor quality of life with a functional decline.  Patient failed swallow evaluation today.  On fluid hydration.  Patient will not be able to keep up with his nutritional needs.  Patient will be a candidate for hospice and comfort care.  Guardian to come see patient today and then submit paperwork for DNR.  Aggressive behavior due to dementia [facility]) Patient failed swallow evaluation today.  Patient was calm this morning when I saw him.  Hypothermia Borderline SIRS No evidence of sepsis.  Chest x-ray on 5/21 negative  for infection.  Heart failure with preserved ejection fraction (HFpEF)  2D echo May 2024 with EF of 50 to 55% and grade 1 diastolic dysfunction.  No signs of heart failure.  Pressure injury of skin Present on admission see full description below  Dementia (HCC) Baseline agitation in setting of dementia   Complete heart block (HCC) Status post pacemaker  Thrombocytopenia  (HCC) Chronic in nature  GERD (gastroesophageal reflux disease) Hold PPI  Essential hypertension Blood pressure normal range off medication.  Mixed hyperlipidemia Hold Lipitor  Acquired hypothyroidism Continue Synthroid  if able to take.  Last TSH normal range.        Subjective: Patient seen this morning and open his eyes to stimulation.  Did not talk to me.  Admitted 100 days ago with SIRS.  Yesterday was agitated but today more calm.  Sodium up at 164 and creatinine up at 5.16.  Failed swallow evaluation.  Physical Exam: Vitals:   12/01/23 1409 12/01/23 1956 12/02/23 0334 12/02/23 0847  BP: (!) 109/99 112/88 106/86 135/62  Pulse: 81 78 72 67  Resp: 16 17 17 14   Temp:  98 F (36.7 C) 98 F (36.7 C) 98.2 F (36.8 C)  TempSrc:  Oral    SpO2: 100% 100% 100% 98%  Weight:      Height:       Physical Exam HENT:     Head: Normocephalic.     Mouth/Throat:     Pharynx: No oropharyngeal exudate.  Eyes:     General: Lids are normal.     Conjunctiva/sclera: Conjunctivae normal.  Cardiovascular:     Rate and Rhythm: Normal rate and regular rhythm.     Heart sounds: Normal heart sounds, S1 normal and S2 normal.  Pulmonary:     Breath sounds: No decreased breath sounds, wheezing, rhonchi or rales.  Abdominal:     Palpations: Abdomen is soft.     Tenderness: There is no abdominal tenderness.  Musculoskeletal:     Right lower leg: No swelling.     Left lower leg: No swelling.  Skin:    General: Skin is warm.     Findings: No rash.  Neurological:     Mental Status: He is lethargic.     Comments: Opened eyes for me     Data Reviewed: White blood count 17.8, hemoglobin 15.6, platelet count 114, creatinine 5.16, sodium 164, elevated liver function test  Family Communication: Spoke with guardian on the phone and he is coming in to evaluate.  Disposition: Status is: Inpatient Remains inpatient appropriate because: Appreciate palliative care consultation.  Guardian to  come in and evaluate him.  He will get paperwork submitted for DO NOT RESUSCITATE.  Continue D5W IV.  Overall prognosis is poor.  High likelihood of cardiopulmonary arrest.  Planned Discharge Destination: Patient is a candidate for hospice facility but will need to be a DO NOT RESUSCITATE.    Time spent: 30 minutes Spoke with the palliative care team, nursing staff, speech pathology and guardian.  Author: Verla Glaze, MD 12/02/2023 1:59 PM  For on call review [email or web address].

## 2023-12-02 NOTE — Evaluation (Signed)
 Clinical/Bedside Swallow Evaluation Patient Details  Name: Hunter [name] MRN: [id number] Date of Birth: [birth year]  Today's Date: 12/02/2023 Time: SLP Start Time (ACUTE ONLY): 1050 SLP Stop Time (ACUTE ONLY): 1110 SLP Time Calculation (min) (ACUTE ONLY): 20 min  Past Medical History:  Past Medical History:  Diagnosis Date   Allergic rhinitis 02/02/2007   Anemia    Arthritis    "knees" (02/08/2017)   Chronic lower back pain    CKD (chronic kidney disease), stage III (HCC) 05/27/2011   Complete heart block 02/08/2017   Eczema    Essential hypertension 02/02/2007   Lasix  60 mg, losartan  50mg ,  Amlodipine  5mg --> 2.5 mg.  Usually have to repeat BP measurements  In the past-Maxzide  37.5-25mg .   GERD (gastroesophageal reflux disease) occasional   Hearing loss of both ears    Heart failure with preserved ejection fraction (HFpEF) 12/06/2013   History of skin cancer 03/26/2014   nose    Hx of echocardiogram    Echo (07/2013): Mild LVH, EF 55-60%, grade 1 diastolic dysfunction, mild LAE, PASP 23   Hyperlipidemia    Hypertension    Hypothyroidism    LBBB (left bundle branch block)    Left patella fracture 2018   "no OR" (02/08/2017)   Major depression in partial remission 02/02/2007   Amitriptyline  25mg  for sleep (stop when runs out of #10 04/2018(, zoloft  100-->150mg --> 200mg    Mild neurocognitive disorder 06/21/2019   Neuropathy (HCC) 11/02/2011   Nocturia    Peripheral vascular disease (HCC) LOWER EXTREMITIES   Spinal stenosis in cervical region 03/21/2010   Squamous cell skin cancer, penis: glans (HCC) 05/2010   Initial excision 11/11; recurrence, excision and laser Rx 9/13   Thrombocytopenia (HCC) 05/23/2012   Urinary frequency 09/05/2009   Possible BPH- see Trudi Fus notes    Vitamin D  deficiency 08/15/2008   Past Surgical History:  Past Surgical History:  Procedure Laterality Date   CIRCUMCISION/ LASER DISSECTION PENILE GLANS CANCER  06-02-2010   CYSTOSCOPY WITH URETHRAL DILATATION   03/28/2012   Procedure: CYSTOSCOPY WITH URETHRAL DILATATION;  Surgeon: Edmund Gouge, MD;  Location: [facility];  Service: Urology;  Laterality: N/A;  excision biopsy extensive meatal penile carcinoma meatoplasty   EXCISION RIGHT WRIST BENIGN TUMOR  2002 (APPROX)   hip surgery     INCISION AND DRAINAGE DEEP NECK ABSCESS     INGUINAL HERNIA REPAIR  1970's   KNEE ARTHROSCOPY Right 2017   LUMBAR LAMINECTOMY/DECOMPRESSION MICRODISCECTOMY N/A 05/24/2020   Procedure: OPEN LAMINECTOMY LUMBAR THREE-LUMBAR FOUR;  Surgeon: Pincus Bridgeman, DO;  Location: [facility] OR;  Service: Neurosurgery;  Laterality: N/A;   PACEMAKER IMPLANT N/A 02/08/2017   Procedure: Pacemaker Implant;  Surgeon: Verona Goodwill, MD;  Location: [facility] INVASIVE CV LAB;  Service: Cardiovascular;  Laterality: N/A;   PACEMAKER IMPLANT  02/08/2017   SJM Assurity MRI dual chamber PPM implanted by Dr Rodolfo Clan for complete heart block   PENILE BX  05-09-2010   TONSILLECTOMY     TRANSURETHRAL RESECTION OF PROSTATE N/A 05/10/2015   Procedure: CYSTOSCOPY, URETHRAL MEATAL DILATION, TRANSURETHRAL RESECTION OF THE PROSTATE (TURP);  Surgeon: Annamarie Kid, MD;  Location: [facility]S;  Service: Urology;  Laterality: N/A;   HPI:  Per MD progress note, "89 y.o. male with medical history significant of hypertension, chronic HFpEF, hyperlipidemia, complete bundle branch block status post pacemaker, stage III CKD, dementia currently in ED holding.  Requesting admission for hypothermia as well as lethargy.  Per report, patient has been a longstanding boarding  patient in the ER for psychiatric issues including end-stage dementia and agitation.  Per report, patient with worsening lethargy over the course of the night.  This is a fairly new finding though patient has significant agitation as well as secondary lethargy after antipsychotic regimen.  Patient noted had a rectal temp around 95-96 on evaluation by nursing.  No reports of cough, shortness of  breath.  No reports of chest pain.  No abdominal pain or reported diarrhea.  No reported falls.  Systolic pressures have been fairly stable though with pressures dropping into the 90s overnight.  Noted chronic small sacral pressure ulcer.  Psychiatry noted have been consulted on May 9 for worsening agitation in setting of end-stage dementia.  Medications including Depakote  and Zyprexa  ordered for psychiatric management.  Overnight in the ER with a Tmax of 95.5, heart rate 50s to 70s.  Respirations into the 20s.  Blood pressure 90s to 110s over 40s to 50s.  Satting well on room air.  White count 5.1, hemoglobin 14.5, platelets 100, creatinine 1.53.  LFTs and T. bili grossly within normal limits.  Urinalysis not indicative of infection.  Chest x-ray and CT head grossly within normal limits.  CT abdomen pelvis with initial concern for gallbladder pathology however ruled out.  General surgery consultation or requested and appreciated.  No surgical intervention warranted.  General surgery has signed off as of 5/14.  LFTs have normalized." CXR 12/01/23: No active disease.    Assessment / Plan / Recommendation  Clinical Impression  Pt seen for bedside swallow assessment secondary to pt refusal/expectoration of all PO and nursing concern for aspiration. Pt presents with severe cognitive impairment (per psych note from 5/21- end stage dementia) directly impacting oropharyngeal function and PO intake. Pt with significant oral secretions (pooling in mouth and dried to lips/lingual surface) indicating reduced secretion management. Oral phase deficits notable for lack of labial seal on straw/cup, oral holding, and expectoration of all solids. Secondary to expectoration- no swallow completion observed. Pt demonstrating increased physical agitation during session- leading to pt swatting at therapist with PO attempt; therefore further trials held.   Defer to medical team for diet recommendation- suspect that severity of  cognitive impairment will hinder PO intake/medication administration no matter presentation. Cognition and dependency with feeding increases pt risk for aspiration- recommend aspiration precautions (slow rate, small bites, elevated HOB, and alert for PO intake). Recommend palliative consult. MD and RN aware of recommendations. Based on severity of cognitive impairment/agitation- pt is not a candidate for speech therapy intervention at this time.    SLP Visit Diagnosis: Dysphagia, unspecified (R13.10) (related to end stage dementia)    Aspiration Risk  Moderate aspiration risk    Diet Recommendation    (defer to MD team)       Other  Recommendations Oral Care Recommendations: Oral care before and after PO;Oral care BID;Staff/trained caregiver to provide oral care    Recommendations for follow up therapy are one component of a multi-disciplinary discharge planning process, led by the attending physician.  Recommendations may be updated based on patient status, additional functional criteria and insurance authorization.  Follow up Recommendations No SLP follow up      Assistance Recommended at Discharge  Total assist for PO intake  Functional Status Assessment Patient has had a recent decline in their functional status and/or demonstrates limited ability to make significant improvements in function in a reasonable and predictable amount of time    Swallow Study   General Date of Onset: 12/02/23  HPI: Per MD progress note, "89 y.o. male with medical history significant of hypertension, chronic HFpEF, hyperlipidemia, complete bundle branch block status post pacemaker, stage III CKD, dementia currently in ED holding.  Requesting admission for hypothermia as well as lethargy.  Per report, patient has been a longstanding boarding patient in the ER for psychiatric issues including end-stage dementia and agitation.  Per report, patient with worsening lethargy over the course of the night.  This is a  fairly new finding though patient has significant agitation as well as secondary lethargy after antipsychotic regimen.  Patient noted had a rectal temp around 95-96 on evaluation by nursing.  No reports of cough, shortness of breath.  No reports of chest pain.  No abdominal pain or reported diarrhea.  No reported falls.  Systolic pressures have been fairly stable though with pressures dropping into the 90s overnight.  Noted chronic small sacral pressure ulcer.  Psychiatry noted have been consulted on May 9 for worsening agitation in setting of end-stage dementia.  Medications including Depakote  and Zyprexa  ordered for psychiatric management.  Overnight in the ER with a Tmax of 95.5, heart rate 50s to 70s.  Respirations into the 20s.  Blood pressure 90s to 110s over 40s to 50s.  Satting well on room air.  White count 5.1, hemoglobin 14.5, platelets 100, creatinine 1.53.  LFTs and T. bili grossly within normal limits.  Urinalysis not indicative of infection.  Chest x-ray and CT head grossly within normal limits.  CT abdomen pelvis with initial concern for gallbladder pathology however ruled out.  General surgery consultation or requested and appreciated.  No surgical intervention warranted.  General surgery has signed off as of 5/14.  LFTs have normalized." CXR 12/01/23: No active disease. Type of Study: Bedside Swallow Evaluation Previous Swallow Assessment: none in chart Diet Prior to this Study: Regular;Thin liquids (Level 0) Temperature Spikes Noted: No (WBC 17.8) Respiratory Status: Room air History of Recent Intubation: No Behavior/Cognition: Confused;Agitated;Uncooperative;Lethargic/Drowsy Oral Cavity Assessment: Excessive secretions;Dry Oral Care Completed by SLP: Yes Oral Cavity - Dentition: Poor condition;Adequate natural dentition Vision:  (not assessed) Self-Feeding Abilities: Total assist (bilateral mittens in place.) Patient Positioning: Upright in bed Baseline Vocal Quality:  (no  vocalizations) Volitional Cough: Cognitively unable to elicit Volitional Swallow: Unable to elicit    Oral/Motor/Sensory Function Overall Oral Motor/Sensory Function: Other (comment) (grossly intact- pt did not complete oral motor commands.)   Ice Chips Ice chips: Not tested   Thin Liquid Thin Liquid: Impaired Presentation: Cup;Straw Oral Phase Impairments: Poor awareness of bolus;Reduced labial seal Oral Phase Functional Implications:  (inability to pull on straw- seal lips to cup) Pharyngeal  Phase Impairments:  (no swallow completed)    Nectar Thick Nectar Thick Liquid: Not tested   Honey Thick Honey Thick Liquid: Not tested   Puree Puree: Not tested   Solid     Solid: Impaired Presentation:  (therapist admistered) Oral Phase Impairments: Poor awareness of bolus;Reduced lingual movement/coordination (no attempt to manipulate) Oral Phase Functional Implications: Oral holding (expectoration) Pharyngeal Phase Impairments:  (no swallow completed)     [place] Maansi Wike Clapp, MS, CCC-SLP Speech Language Pathologist Rehab Services; [facility] [place] [phone] (ascom)   [place] J Clapp 12/02/2023,12:49 PM

## 2023-12-02 NOTE — Assessment & Plan Note (Addendum)
 Sodium very elevated at 164.  This is an independent risk factor for mortality.  Patient failed swallow evaluation 2 days ago.

## 2023-12-02 NOTE — Progress Notes (Signed)
 HYDROmorphone  (DILAUDID ) 1 mg/mL PCA injection returned to Pharmacy. Handed it to [employer].

## 2023-12-02 NOTE — Plan of Care (Signed)
  Problem: Education: Goal: Knowledge of General Education information will improve Description: Including pain rating scale, medication(s)/side effects and non-pharmacologic comfort measures Outcome: Progressing   Problem: Elimination: Goal: Will not experience complications related to bowel motility Outcome: Progressing   Problem: Pain Managment: Goal: General experience of comfort will improve and/or be controlled Outcome: Progressing   Problem: Safety: Goal: Ability to remain free from injury will improve Outcome: Progressing   Problem: Skin Integrity: Goal: Risk for impaired skin integrity will decrease Outcome: Progressing

## 2023-12-02 NOTE — Consult Note (Cosign Needed Addendum)
 Consultation Note Date: 12/02/2023 at 1030  Patient Name: Hunter [name]  DOB: July 05, 1934  MRN: [id number]  Age / Sex: 89 y.o., male  PCP: Hunter Jaeger, MD Referring Physician: Verla Glaze, MD  HPI/Patient Profile: 89 y.o. male  with past medical history of hypertension, chronic HFpEF, hyperlipidemia, complete bundle branch block status post pacemaker, stage III CKD, dementia who has been holding in the ED since August 24, 2023.  Patient was admitted for hypothermia as well as lethargy admitted on 08/24/2023 with psychiatric issues with end-stage dementia and agitation.  As per notes and chart review, patient was admitted after becoming violent with staff and residents at his facility.   Patient is being treated for severe agitation, functional decline, adult failure to thrive, hypothermia, borderline SIRS, and AKI.  PMT was consulted to support patient with goals of care discussions.   Clinical Assessment and Goals of Care: Extensive chart review completed prior to meeting patient including labs, vital signs, imaging, progress notes, orders, and available advanced directive documents from current and previous encounters. I then met with patient at bedside.  He is thin, frail, and lethargic.  His respirations are even and unlabored.  He awakens after significant verbal and physical stimuli.  However, he quickly returned back to sleep.  Patient is unable to participate in goals of care medical decision making at this time.  After assessing the patient, I counseled with attending.  Attending is spoken with guardian who plans to be at bedside today at 2 PM.  Discussed with RN.  Requested that RN notify me when guardian is at bedside for goals of care discussions to continue.    Return bedside later in the day and met with patient's guardian Hunter [name].  Reviewed patient's current medical situation.  Discussed  patient failed his swallow study.  He has poor p.o. intake, poor functional status, and is at end-stage dementia.  Recommended DNR status with comfort measures.  Hunter [name] also received update and had discussion with attending prior to my visit.  Hunter [name] shares understanding of patient's current medical situation and is in agreement with DNR but needs his supervisor to make final improvement.  Reviewed documentation needed to convert patient to DNR and comfort measures.  Completed non-attending provider paperwork.  Clarified and confirmed with DSS guardian Hunter [name] that I, an NP, am able to complete paperwork. Hunter [name] plans to take paperwork to supervisor and call with verbal confirmation of DNR and comfort measures.  Advised Hunter [name] that PMT will continue to follow and support patient throughout this hospitalization.  Briefly discussed potential for transfer to hospice inpatient unit as well as anticipating an in-hospital death.  Hunter [name] is aware and shares he will continue to be available to discuss next steps and adjustments to patient's plan of care.  Awaiting response from guardian in order to move forward with comfort focused care.  Primary Decision Maker LEGAL GUARDIAN  Physical Exam Vitals reviewed.  Constitutional:      General: He is not in acute distress.    Appearance: He  is ill-appearing. He is not diaphoretic.  HENT:     Head: Normocephalic.     Mouth/Throat:     Mouth: Mucous membranes are dry.  Pulmonary:     Effort: Pulmonary effort is normal.  Abdominal:     Palpations: Abdomen is soft.  Musculoskeletal:     Comments: Bilateral mitts in place, unna boots in place  Skin:    Coloration: Skin is pale. Skin is not jaundiced.    Palliative Assessment/Data:10-20%     Thank you for this consult. Palliative medicine will continue to follow and assist holistically.   Time Total: 75 minutes  Time spent includes: Detailed review of medical records (labs, imaging, vital signs),  medically appropriate exam (mental status, respiratory, cardiac, skin), discussed with treatment team, counseling and educating patient, family and staff, documenting clinical information, medication management and coordination of care.  Signed by: Hunter Gut, DNP, FNP-BC Palliative Medicine   Please contact Palliative Medicine Team providers via [facility] for questions and concerns.

## 2023-12-02 NOTE — TOC Progression Note (Signed)
 Transition of Care [facility]) - Progression Note    Patient Details  Name: Hunter [name] MRN: [id number] Date of Birth: 03-05-1934  Transition of Care [facility]) CM/SW Contact  Alexandra Ice, RN Phone Number: 12/02/2023, 3:47 PM  Clinical Narrative:    Met with [name], DDS legal guardian. He sent forms via email that need to be filled out by MD and another non attending physician for DNR submission. He also needed some additional supporting documentation for his supervisor to approve the DNR status change. Provided the information. Forms filled out by MD and NP. He is meeting his supervisor after visiting patient today. He will call TOC with determination.    Expected Discharge Plan:  (TBD) Barriers to Discharge:  (finding an accepting facility)  Expected Discharge Plan and Services     Post Acute Care Choice:  (TBD) Living arrangements for the past 2 months: Skilled Nursing Facility                                       Social Determinants of Health (SDOH) Interventions SDOH Screenings   Food Insecurity: Patient Unable To Answer (11/23/2023)  Housing: Patient Unable To Answer (11/23/2023)  Transportation Needs: Patient Unable To Answer (11/23/2023)  Utilities: Low Risk  (06/13/2023)   Received from [facility]  Alcohol Screen: Low Risk  (07/06/2021)  Depression (PHQ2-9): Medium Risk (11/26/2022)  Financial Resource Strain: Low Risk  (06/13/2023)   Received from [facility]  Physical Activity: Insufficiently Active (06/13/2023)   Received from [facility]  Social Connections: Patient Unable To Answer (11/23/2023)  Stress: No Stress Concern Present (06/13/2023)   Received from [facility]  Tobacco Use: Low Risk  (11/25/2023)  Health Literacy: Medium Risk (06/13/2023)   Received from [facility]    Readmission Risk Interventions     No data to display

## 2023-12-02 NOTE — Consult Note (Signed)
 [facility] Psychiatric Consult Follow up  Patient Name: [name]  MRN: [id number]  DOB: Jan 10, 1934  Consult Order details:  Orders (From admission, onward)     Start     Ordered   11/19/23 1501  IP CONSULT TO PSYCHIATRY       Ordering Provider: Lind Repine, MD  Provider:  (Not yet assigned)  Question Answer Comment  Place call to: Psych NP   Reason for Consult Consult      11/19/23 1500   10/15/23 1359  IP CONSULT TO PSYCHIATRY       Ordering Provider: Viviano Ground, MD  Provider:  (Not yet assigned)  Question Answer Comment  Consult Timeframe URGENT - requires response within 12 hours   URGENT timeframe requires provider to provider communication, has the provider to provider communication been completed Yes   Reason for Consult? Consult for medication management   Contact phone number where the requesting provider can be reached [phone]      10/15/23 1358   10/09/23 0238  IP CONSULT TO PSYCHIATRY       Ordering Provider: Dewitt Forehand, DO  Provider:  (Not yet assigned)  Question Answer Comment  Consult Timeframe URGENT - requires response within 12 hours   URGENT timeframe requires provider to provider communication, has the provider to provider communication been completed Yes   Reason for Consult? Consult for medication management due to increasing agitation, not improving withy last medication adjustments 10/04/23   Contact phone number where the requesting provider can be reached [phone]      10/09/23 0238   10/08/23 1128  IP CONSULT TO PSYCHIATRY       Comments: Pt depakote  level being checked and psych needs to review/manage meds as agitation has continued.  Ordering Provider: Shane Darling, MD  Provider:  (Not yet assigned)  Question Answer Comment  Place call to: psych   Reason for Consult Other (See Comments) Medication mgmt  Diagnosis/Clinical Info for Consult: continued aggressive behavior      10/08/23 1132   08/24/23 0045  IP CONSULT TO  PSYCHIATRY       Ordering Provider: Norlene Beavers, MD  Provider:  (Not yet assigned)  Question Answer Comment  Place call to: psychiatry   Reason for Consult Consult   Diagnosis/Clinical Info for Consult: aggressive behavior      08/24/23 0044             Mode of Visit: In person    Psychiatry Consult Evaluation  Service Date: Dec 02, 2023 LOS:  LOS: 9 days  Chief Complaint agitation  Primary Psychiatric Diagnoses  Dementia with behavioral disturbance    Assessment  Hunter [name] is a 89 y.o. male admitted: Medicallyfor 08/24/2023 12:14 AM with hx of dementia brought in to ED for agitation form a facility. Patient has been in ED waiting for placement. Psychiatry is requested to get involved again as patient's agitation has been getting worse.  Sensory Deprivation: Legal guardian wants to bring patient's hearing aids and this provider recommended to bring in hearing aids as sensory deprivation can make confusion worse. EKG 11/19/23- QTC 539- discontinued PRN zyprexa  and increased ativan  2 mg Q6 hr PRN.  Discussed extensively with the legal guardian about the risk benefits of the current treatment in the context of patient's uncontrollable agitation and QTc prolongation and EKG.  Also discussed to consider DNR and palliative care given the poor quality of life and all the complications if medicated. Legal guardian  expressed her understanding and gave consent for the medication adjustments.   12/02/23: Patient is noted to be laying in the bed very restless with eyes closed.  He is not responding to any commands as he is hard of hearing and also very confused so he does not respond to any other means of communication including writing or sign language.  DSS caseworker/patient's legal guardian visited the patient and got a request for DNR and palliative care signed by the primary team and palliative care NP.  We will await the final decision by DSS  Diagnoses:  Active Hospital  problems: Principal Problem:   Hypernatremia Active Problems:   Acquired hypothyroidism   Mixed hyperlipidemia   Essential hypertension   GERD (gastroesophageal reflux disease)   CKD (chronic kidney disease)   Thrombocytopenia (HCC)   Heart failure with preserved ejection fraction (HFpEF)    Complete heart block (HCC)   Dementia (HCC)   Aggressive behavior due to dementia [facility])   Physically aggressive behavior   Hypothermia   Pressure injury of skin   Failure to thrive in adult   Acute kidney injury superimposed on CKD (HCC)    Plan   ## Psychiatric Medication Recommendations:  Klonopin  1 mg BID  Zyprexa  5 mg TID ( minimize the use of PRN geodon  as it has more impact on Qtc) Reduced Zoloft  100 mg daily to see if his irritability improves  ## Medical Decision Making Capacity: Not specifically addressed in this encounter  ## Further Work-up:  -- Depakote  levels every morning 11/23/23-79  -- most recent EKG on 11/24/2023 had QtC of 514 -- Pertinent labwork reviewed earlier this admission includes: CMP, ammonia levels   ## Disposition:--Recommend nursing home after better control of behavioral disturbance  ## Behavioral / Environmental: -Delirium Precautions: Delirium Interventions for Nursing and Staff: - RN to open blinds every AM. - To Bedside: Glasses, hearing aide, and pt's own shoes. Make available to patients. when possible and encourage use. - Encourage po fluids when appropriate, keep fluids within reach. - OOB to chair with meals. - Passive ROM exercises to all extremities with AM & PM care. - RN to assess orientation to person, time and place QAM and PRN. - Recommend extended visitation hours with familiar family/friends as feasible. - Staff to minimize disturbances at night. Turn off television when pt asleep or when not in use.    ## Safety and Observation Level:  - Based on my clinical evaluation, I estimate the patient to be at low risk of self harm in the current  setting. - At this time, we recommend  routine. This decision is based on my review of the chart including patient's history and current presentation, interview of the patient, mental status examination, and consideration of suicide risk including evaluating suicidal ideation, plan, intent, suicidal or self-harm behaviors, risk factors, and protective factors. This judgment is based on our ability to directly address suicide risk, implement suicide prevention strategies, and develop a safety plan while the patient is in the clinical setting. Please contact our team if there is a concern that risk level has changed.  CSSR Risk Category:C-SSRS RISK CATEGORY: No Risk  Suicide Risk Assessment: Unable to assess at this point as patient has hard of hearing and has no hearing aids, patient completely confused and unable to engage in a meaningful interview  Thank you for this consult request. Recommendations have been communicated to the primary team.  We will continue to follow-up as needed basis at this time.  Leroi Haque, MD       History of Present Illness  Hunter [name] is a 89 y.o. male admitted: Medicallyfor 08/24/2023 12:14 AM with hx of dementia brought in to ED for agitation form a facility. Patient has been in ED waiting for placement. Psychiatry is requested to get involved again as patient's agitation has been getting worse.  12/02/23: Patient is noted to be laying in the bed very restless with eyes closed.  He is not responding to any commands as he is hard of hearing and also very confused so he does not respond to any other means of communication including writing or sign language.    Psychiatric and Social History  Psychiatric History: Unable to obtain as there is no family member available, DSS legal guardian is new to the patient as patient is extremely confused  Social History:  Unable to obtain  Substance History Unable to obtain  Exam Findings  Physical Exam: Reviewed  and agree with the physical exam findings conducted by the ED provider. Vital Signs:  Temp:  [98 F (36.7 C)-98.4 F (36.9 C)] 98.4 F (36.9 C) (05/22 1626) Pulse Rate:  [67-73] 73 (05/22 1626) Resp:  [14-17] 15 (05/22 1626) BP: (106-155)/(59-86) 155/59 (05/22 1626) SpO2:  [95 %-100 %] 95 % (05/22 1626) Blood pressure (!) 155/59, pulse 73, temperature 98.4 F (36.9 C), resp. rate 15, height 6' (1.829 m), weight 97.5 kg, SpO2 95%. Body mass index is 29.16 kg/m.    Mental Status Exam: General Appearance: Disheveled  Orientation:  Negative  Memory:  Negative  Concentration:  pt sedated  Recall:  Negative  Attention  Other: pt hard of hearing  Eye Contact:  pt sedated  Speech:  pt sedated  Language:  pt sedated  Volume:  pt sedated  Mood: unable to assess  Affect:  Labile  Thought Process:  pt sedated  Thought Content:  pt sedated  Suicidal Thoughts:  unable to assess  Homicidal Thoughts:  unable to assess  Judgement:  Impaired  Insight:  Lacking  Psychomotor Activity:  pt sedated  Akathisia:  No  Fund of Knowledge:  Negative      Assets:  Resilience  Cognition:  Impaired,  Moderate  ADL's:  Impaired  AIMS (if indicated):        Other History   These have been pulled in through the EMR, reviewed, and updated if appropriate.  Family History:  The patient's family history includes Arthritis in an other family member; Hyperlipidemia in an other family member; Hypertension in an other family member; Lung cancer in his mother; Prostate cancer in an other family member.  Medical History: Past Medical History:  Diagnosis Date   Allergic rhinitis 02/02/2007   Anemia    Arthritis    "knees" (02/08/2017)   Chronic lower back pain    CKD (chronic kidney disease), stage III (HCC) 05/27/2011   Complete heart block 02/08/2017   Eczema    Essential hypertension 02/02/2007   Lasix  60 mg, losartan  50mg ,  Amlodipine  5mg --> 2.5 mg.  Usually have to repeat BP measurements  In the  past-Maxzide  37.5-25mg .   GERD (gastroesophageal reflux disease) occasional   Hearing loss of both ears    Heart failure with preserved ejection fraction (HFpEF) 12/06/2013   History of skin cancer 03/26/2014   nose    Hx of echocardiogram    Echo (07/2013): Mild LVH, EF 55-60%, grade 1 diastolic dysfunction, mild LAE, PASP 23   Hyperlipidemia    Hypertension  Hypothyroidism    LBBB (left bundle branch block)    Left patella fracture 2018   "no OR" (02/08/2017)   Major depression in partial remission 02/02/2007   Amitriptyline  25mg  for sleep (stop when runs out of #10 04/2018(, zoloft  100-->150mg --> 200mg    Mild neurocognitive disorder 06/21/2019   Neuropathy (HCC) 11/02/2011   Nocturia    Peripheral vascular disease (HCC) LOWER EXTREMITIES   Spinal stenosis in cervical region 03/21/2010   Squamous cell skin cancer, penis: glans (HCC) 05/2010   Initial excision 11/11; recurrence, excision and laser Rx 9/13   Thrombocytopenia (HCC) 05/23/2012   Urinary frequency 09/05/2009   Possible BPH- see Trudi Fus notes    Vitamin D  deficiency 08/15/2008    Surgical History: Past Surgical History:  Procedure Laterality Date   CIRCUMCISION/ LASER DISSECTION PENILE GLANS CANCER  06-02-2010   CYSTOSCOPY WITH URETHRAL DILATATION  03/28/2012   Procedure: CYSTOSCOPY WITH URETHRAL DILATATION;  Surgeon: Edmund Gouge, MD;  Location: [facility] [place];  Service: Urology;  Laterality: N/A;  excision biopsy extensive meatal penile carcinoma meatoplasty   EXCISION RIGHT WRIST BENIGN TUMOR  2002 (APPROX)   hip surgery     INCISION AND DRAINAGE DEEP NECK ABSCESS     INGUINAL HERNIA REPAIR  1970's   KNEE ARTHROSCOPY Right 2017   LUMBAR LAMINECTOMY/DECOMPRESSION MICRODISCECTOMY N/A 05/24/2020   Procedure: OPEN LAMINECTOMY LUMBAR THREE-LUMBAR FOUR;  Surgeon: Pincus Bridgeman, DO;  Location: [facility] OR;  Service: Neurosurgery;  Laterality: N/A;   PACEMAKER IMPLANT N/A 02/08/2017   Procedure: Pacemaker Implant;   Surgeon: Verona Goodwill, MD;  Location: [facility] INVASIVE CV LAB;  Service: Cardiovascular;  Laterality: N/A;   PACEMAKER IMPLANT  02/08/2017   SJM Assurity MRI dual chamber PPM implanted by Dr Rodolfo Clan for complete heart block   PENILE BX  05-09-2010   TONSILLECTOMY     TRANSURETHRAL RESECTION OF PROSTATE N/A 05/10/2015   Procedure: CYSTOSCOPY, URETHRAL MEATAL DILATION, TRANSURETHRAL RESECTION OF THE PROSTATE (TURP);  Surgeon: Annamarie Kid, MD;  Location: [facility] ORS;  Service: Urology;  Laterality: N/A;     Medications:   Current Facility-Administered Medications:    acetaminophen  (TYLENOL ) tablet 650 mg, 650 mg, Oral, Q6H PRN **OR** acetaminophen  (TYLENOL ) suppository 650 mg, 650 mg, Rectal, Q6H PRN, Clelia Current, Richard, MD   antiseptic oral rinse (BIOTENE) solution 15 mL, 15 mL, Topical, PRN, Clelia Current, Richard, MD   glycopyrrolate (ROBINUL) tablet 1 mg, 1 mg, Oral, Q4H PRN **OR** glycopyrrolate (ROBINUL) injection 0.2 mg, 0.2 mg, Subcutaneous, Q4H PRN **OR** glycopyrrolate (ROBINUL) injection 0.2 mg, 0.2 mg, Intravenous, Q4H PRN, Wieting, Richard, MD   haloperidol  (HALDOL ) tablet 0.5 mg, 0.5 mg, Oral, Q4H PRN **OR** haloperidol  (HALDOL ) 2 MG/ML solution 0.5 mg, 0.5 mg, Sublingual, Q4H PRN **OR** haloperidol  lactate (HALDOL ) injection 0.5 mg, 0.5 mg, Intravenous, Q4H PRN, Clelia Current, Richard, MD   HYDROmorphone  (DILAUDID ) 1 mg/mL PCA injection, , Intravenous, Q4H, Wieting, Richard, MD   HYDROmorphone  (DILAUDID ) injection 0.25-1 mg, 0.25-1 mg, Intravenous, Q30 min PRN, Lanetta Pion, MD   LORazepam  (ATIVAN ) tablet 1 mg, 1 mg, Oral, Q4H PRN **OR** LORazepam  (ATIVAN ) 2 MG/ML concentrated solution 1 mg, 1 mg, Sublingual, Q4H PRN **OR** LORazepam  (ATIVAN ) injection 1 mg, 1 mg, Intravenous, Q4H PRN, Clelia Current, Richard, MD   [DISCONTINUED] ondansetron  (ZOFRAN ) tablet 4 mg, 4 mg, Oral, Q6H PRN **OR** ondansetron  (ZOFRAN ) injection 4 mg, 4 mg, Intravenous, Q6H PRN, Corrinne Din, MD   polyvinyl alcohol  (LIQUIFILM TEARS) 1.4 % ophthalmic solution 1 drop, 1 drop, Both Eyes, QID  PRN, Verla Glaze, MD   sodium chloride  flush (NS) 0.9 % injection 9 mL, 9 mL, Intravenous, PRN, Verla Glaze, MD  Allergies: No Known Allergies  Dimetrius Montfort, MD

## 2023-12-02 NOTE — Assessment & Plan Note (Addendum)
 AKI on CKD stage IIIb.  Creatinine 1.62 on 5/17.  Last creatinine 5.16.

## 2023-12-02 NOTE — Progress Notes (Signed)
 Spoke with gaurdian,  DNR approved and patient made a DNR.  They are okay with comfort measures for comfort with end of life care.  Will place end of life order set.  Dr Verla Glaze

## 2023-12-03 DIAGNOSIS — Z515 Encounter for palliative care: Secondary | ICD-10-CM | POA: Diagnosis not present

## 2023-12-03 DIAGNOSIS — E870 Hyperosmolality and hypernatremia: Secondary | ICD-10-CM | POA: Diagnosis not present

## 2023-12-03 DIAGNOSIS — E876 Hypokalemia: Secondary | ICD-10-CM

## 2023-12-03 DIAGNOSIS — N179 Acute kidney failure, unspecified: Secondary | ICD-10-CM | POA: Diagnosis not present

## 2023-12-03 DIAGNOSIS — R4689 Other symptoms and signs involving appearance and behavior: Secondary | ICD-10-CM | POA: Diagnosis not present

## 2023-12-03 DIAGNOSIS — R627 Adult failure to thrive: Secondary | ICD-10-CM | POA: Diagnosis not present

## 2023-12-03 DIAGNOSIS — T68XXXA Hypothermia, initial encounter: Secondary | ICD-10-CM | POA: Diagnosis not present

## 2023-12-03 MED ORDER — HYDROMORPHONE HCL-NACL 50-0.9 MG/50ML-% IV SOLN
0.5000 mg/h | INTRAVENOUS | Status: DC
  Administered 2023-12-03: 0.5 mg/h via INTRAVENOUS
  Filled 2023-12-03: qty 50

## 2023-12-03 NOTE — TOC Progression Note (Signed)
 Transition of Care [facility]) - Progression Note    Patient Details  Name: Hunter [name] MRN: [id number] Date of Birth: 1934-05-20  Transition of Care [facility]) CM/SW Contact  Alexandra Ice, RN Phone Number: 12/03/2023, 12:34 PM  Clinical Narrative:     Received call from [name], DSS/legal guardian. He stated that patient was made DNR, with comfort measures. He will have Palliative consult. He stated if patient is eligible for inpatient hospice. He would like patient to go to [facility] in [place], [place], as patient has friends there would to visit him.   Expected Discharge Plan:  (TBD) Barriers to Discharge:  (finding an accepting facility)  Expected Discharge Plan and Services     Post Acute Care Choice:  (TBD) Living arrangements for the past 2 months: Skilled Nursing Facility                                       Social Determinants of Health (SDOH) Interventions SDOH Screenings   Food Insecurity: Patient Unable To Answer (11/23/2023)  Housing: Patient Unable To Answer (11/23/2023)  Transportation Needs: Patient Unable To Answer (11/23/2023)  Utilities: Low Risk  (06/13/2023)   Received from [facility]  Alcohol Screen: Low Risk  (07/06/2021)  Depression (PHQ2-9): Medium Risk (11/26/2022)  Financial Resource Strain: Low Risk  (06/13/2023)   Received from [facility]  Physical Activity: Insufficiently Active (06/13/2023)   Received from [facility]  Social Connections: Patient Unable To Answer (11/23/2023)  Stress: No Stress Concern Present (06/13/2023)   Received from [facility]  Tobacco Use: Low Risk  (11/25/2023)  Health Literacy: Medium Risk (06/13/2023)   Received from [facility]    Readmission Risk Interventions     No data to display

## 2023-12-03 NOTE — Progress Notes (Signed)
 Nutrition Brief Note  Chart reviewed. Pt now transitioning to comfort care.  No further nutrition interventions planned at this time.  Please re-consult as needed.   Levada Schilling, RD, LDN, CDCES Registered Dietitian III Certified Diabetes Care and Education Specialist If unable to reach this RD, please use "RD Inpatient" group chat on secure chat between hours of 8am-4 pm daily

## 2023-12-03 NOTE — Assessment & Plan Note (Addendum)
 Patient made comfort care measures in the evening of 5/22.  Patient also made DNR.  Patient passed away on 03-Jan-2024 and 6:56 AM.

## 2023-12-03 NOTE — Progress Notes (Addendum)
 [facility] LIAISON NOTE  Received request for Hospice InPatient Unit [facility]).  During chart review, I saw patient DSS guardian had made a request to Natasha Bailiff [facility] that if patient met criteria for IPU, then they would like to use [facility], in [place] due to location of patient's friends/family who would want to visit.  Communicated same to hospital care team and to [facility] who is now covering patient since he has moved location to [address].  Please call with any hospice related questions or concerns or if there is a change and patient needs hospice IPU in [facility].  Thank you for the opportunity to participate in this patient's care.  Ambrosio Junker, MA, BSN, RN, FNE Nurse Liaison [phone]

## 2023-12-03 NOTE — Progress Notes (Signed)
 Progress Note   Patient: Hunter [name]:[id number] DOB: 04-20-1934 DOA: 08/24/2023     10 DOS: the patient was seen and examined on 12/03/2023   Brief hospital course: 89 y.o. male with medical history significant of hypertension, chronic HFpEF, hyperlipidemia, complete bundle branch block status post pacemaker, stage III CKD, dementia currently in ED holding.  Requesting admission for hypothermia as well as lethargy.  Per report, patient has been a longstanding boarding patient in the ER for psychiatric issues including end-stage dementia and agitation.  Per report, patient with worsening lethargy over the course of the night.  This is a fairly new finding though patient has significant agitation as well as secondary lethargy after antipsychotic regimen.  Patient noted had a rectal temp around 95-96 on evaluation by nursing.  No reports of cough, shortness of breath.  No reports of chest pain.  No abdominal pain or reported diarrhea.  No reported falls.  Systolic pressures have been fairly stable though with pressures dropping into the 90s overnight.  Noted chronic small sacral pressure ulcer.  Psychiatry noted have been consulted on May 9 for worsening agitation in setting of end-stage dementia.  Medications including Depakote  and Zyprexa  ordered for psychiatric management. Overnight in the ER with a Tmax of 95.5, heart rate 50s to 70s.  Respirations into the 20s.  Blood pressure 90s to 110s over 40s to 50s.  Satting well on room air.  White count 5.1, hemoglobin 14.5, platelets 100, creatinine 1.53.  LFTs and T. bili grossly within normal limits.  Urinalysis not indicative of infection.  Chest x-ray and CT head grossly within normal limits.  CT abdomen pelvis with initial concern for gallbladder pathology however ruled out.  General surgery consultation or requested and appreciated.  No surgical intervention warranted.  General surgery has signed off as of 5/14.  LFTs have normalized.   5/14: Patient  remain as agitated, dangerous behavior towards staff.  5/21.  Called by nursing staff that his blood pressure is low.  He is spitting out medications and swelling at the nursing staff.  Would not take anything oral even food.  Spoke with patient's guardian and he will come visit tomorrow to assess.  He needs a lot of paperwork and needs to present case to his supervisor in order to get a DNR. 5/22.  Sodium up at 164, creatinine 5.16.  Fluid bolus and IV fluids switched over to D5W.  Overall prognosis is poor.  Patient made comfort care measures and DNR on the evening of 5/22. 5/23.  Continue comfort care measures.  Assessment and Plan: * End of life care Patient made comfort care measures in the evening of 5/22.  Patient also made DNR.  Continue comfort care measures.  Hospice to evaluate.  Acute kidney injury superimposed on CKD (HCC) AKI on CKD stage IIIb.  Creatinine 1.62 on 5/17.  Last creatinine 5.16.    Hypernatremia Sodium very elevated at 164.  This is an independent risk factor for mortality.  Patient failed swallow evaluation yesterday.  Failure to thrive in adult Patient has a very poor quality of life with a functional decline.  Patient failed swallow evaluation yesterday.    Aggressive behavior due to dementia [facility]) Patient failed swallow evaluation yesterday.  On comfort care measures currently  Hypothermia Borderline SIRS No evidence of sepsis.  Chest x-ray on 5/21 negative for infection.  Heart failure with preserved ejection fraction (HFpEF)  2D echo May 2024 with EF of 50 to 55% and grade 1  diastolic dysfunction.  No signs of heart failure.  Pressure injury of skin Present on admission see full description below  Dementia (HCC) Baseline agitation in setting of dementia   Complete heart block (HCC) Status post pacemaker  Thrombocytopenia (HCC) Chronic in nature  GERD (gastroesophageal reflux disease) Hold PPI  Essential hypertension Holding blood  pressure medication  Mixed hyperlipidemia Hold Lipitor  Acquired hypothyroidism Holding Synthroid         Subjective: Patient has his eyes open.  Started comfort care measures last night.  Here in the hospital 101 days.  Admitted on 5/13 for hypothermia.  Physical Exam: Vitals:   12/02/23 0847 12/02/23 1626 12/03/23 0317 12/03/23 0754  BP: 135/62 (!) 155/59 117/63 138/63  Pulse: 67 73 (!) 59 85  Resp: 14 15 16 16   Temp: 98.2 F (36.8 C) 98.4 F (36.9 C) 97.8 F (36.6 C) 97.6 F (36.4 C)  TempSrc:    Axillary  SpO2: 98% 95% 98% 96%  Weight:      Height:       Physical Exam HENT:     Head: Normocephalic.  Eyes:     General: Lids are normal.     Conjunctiva/sclera: Conjunctivae normal.  Cardiovascular:     Rate and Rhythm: Normal rate and regular rhythm.     Heart sounds: Normal heart sounds, S1 normal and S2 normal.  Pulmonary:     Breath sounds: No decreased breath sounds, wheezing, rhonchi or rales.  Abdominal:     Palpations: Abdomen is soft.     Tenderness: There is no abdominal tenderness.  Musculoskeletal:     Right lower leg: No swelling.     Left lower leg: No swelling.  Skin:    General: Skin is warm.     Findings: No rash.  Neurological:     Mental Status: He is unresponsive.     Comments: Had eyes open     Data Reviewed: No further lab draws.  Last sodium 164 last creatinine 5.16  Family Communication: Spoke with guardian on the phone  Disposition: Status is: Inpatient Remains inpatient appropriate because: Patient undergoing end-of-life care Planned Discharge Destination: Hospice evaluation.    Time spent: 27 minutes Case discussed with nursing staff and palliative team.  Author: Verla Glaze, MD 12/03/2023 11:47 AM  For on call review [email or web address].

## 2023-12-03 NOTE — Progress Notes (Signed)
 Daily Progress Note   Patient Name: Hunter [name]       Date: 12/03/2023 DOB: May 16, 1934  Age: 89 y.o. MRN#: [id number] Attending Physician: Verla Glaze, MD Primary Care Physician: Almira Jaeger, MD Admit Date: 08/24/2023  Reason for Consultation/Follow-up: Establishing goals of care  HPI/Brief Hospital Review: 89 y.o. male  with past medical history of hypertension, chronic HFpEF, hyperlipidemia, complete bundle branch block status post pacemaker, stage III CKD, dementia who has been holding in the ED since August 24, 2023.  Patient was admitted for hypothermia as well as lethargy admitted on 08/24/2023 with psychiatric issues with end-stage dementia and agitation.  As per notes and chart review, patient was admitted after becoming violent with staff and residents at his facility.    Patient is being treated for severe agitation, functional decline, adult failure to thrive, hypothermia, borderline SIRS, and AKI.  Per chart review, primary team discussed plan with legal guardian and decision was made to transfer to [facility] 12/02/2023.   PMT was consulted to support patient with goals of care discussions.   Subjective: Extensive chart review has been completed prior to meeting patient including labs, vital signs, imaging, progress notes, orders, and available advanced directive documents from current and previous encounters.    Visited with Hunter [name] at his bedside earlier in AM. He is resting in bed with eyes closed, he does not acknowledge my presence in room. Attempted gentle sternal rub without response. He appears comfortable, even respirations, no signs of discomfort or distress. Per MAR, recently received dose of Haldol , Lorazepam  and Dilaudid . Orders placed to initiate continuous  hydromorphone  infusion.   Collaborated with TOC, primary team as well as hospice liaison regarding hospice IPU. Later updated from team, conversations had between [facility] and legal guardian with requests made for [facility].  Returned to bedside, has been transferred to [facility], continuous dilaudid  infusion infusing, Hunter [name] remains unresponsive to voice or gentle touch. Respirations remain even and unlabored, appears comfortable without discomfort or distress. Cool lower extremities and signs of mottling to lower extremities. Will reassess in AM, likely anticipating hospital death. Please continue to treat and maintain comfort.  PMT to continue to follow for ongoing needs and support.  Care plan was discussed with primary team, nursing staff and TOC.  Thank you for allowing the Palliative Medicine  Team to assist in the care of this patient.  Total time:  35 minutes  Time spent includes: Detailed review of medical records (labs, imaging, vital signs), medically appropriate exam (mental status, respiratory, cardiac, skin), discussed with treatment team, counseling and educating patient, family and staff, documenting clinical information, medication management and coordination of care.  Hunter Marble, DNP, AGNP-C Palliative Medicine   Please contact Palliative Medicine Team phone at [phone] for questions and concerns.

## 2023-12-12 NOTE — Death Summary Note (Signed)
 DEATH SUMMARY   Patient Details  Name: Hunter [name] MRN: [id number] DOB: 02-20-1934 CWC:[id number], Saverio Curling, MD Admission/Discharge Information   Admit Date:  09-07-2023  Date of Death: Date of Death: December 18, 2023  Time of Death: Time of Death: 0656  Length of Stay: 01-05-2024   Principle Cause of death: Failure to thrive, hypernatremia, acute kidney injury on CKD  Hospital Diagnoses: Principal Problem:   End of life care Active Problems:   Hypernatremia   Acute kidney injury superimposed on CKD (HCC)   Aggressive behavior due to dementia (HCC)   Failure to thrive in adult   Hypothermia   Heart failure with preserved ejection fraction (HFpEF)    Acquired hypothyroidism   Mixed hyperlipidemia   Essential hypertension   GERD (gastroesophageal reflux disease)   CKD (chronic kidney disease)   Thrombocytopenia (HCC)   Complete heart block (HCC)   Dementia (HCC)   Physically aggressive behavior   Pressure injury of skin   Hospital Course: 89 y.o. male with medical history significant of hypertension, chronic HFpEF, hyperlipidemia, complete bundle branch block status post pacemaker, stage III CKD, dementia currently in ED holding.  Requesting admission for hypothermia as well as lethargy.  Per report, patient has been a longstanding boarding patient in the ER for psychiatric issues including end-stage dementia and agitation.  Per report, patient with worsening lethargy over the course of the night.  This is a fairly new finding though patient has significant agitation as well as secondary lethargy after antipsychotic regimen.  Patient noted had a rectal temp around 95-96 on evaluation by nursing.  No reports of cough, shortness of breath.  No reports of chest pain.  No abdominal pain or reported diarrhea.  No reported falls.  Systolic pressures have been fairly stable though with pressures dropping into the 90s overnight.  Noted chronic small sacral pressure ulcer.  Psychiatry noted have been  consulted on May 9 for worsening agitation in setting of end-stage dementia.  Medications including Depakote  and Zyprexa  ordered for psychiatric management. Overnight in the ER with a Tmax of 95.5, heart rate 50s to 70s.  Respirations into the 20s.  Blood pressure 90s to 110s over 40s to 50s.  Satting well on room air.  White count 5.1, hemoglobin 14.5, platelets 100, creatinine 1.53.  LFTs and T. bili grossly within normal limits.  Urinalysis not indicative of infection.  Chest x-ray and CT head grossly within normal limits.  CT abdomen pelvis with initial concern for gallbladder pathology however ruled out.  General surgery consultation or requested and appreciated.  No surgical intervention warranted.  General surgery has signed off as of 5/14.  LFTs have normalized.   5/14: Patient remain as agitated, dangerous behavior towards staff.  5/21.  Called by nursing staff that his blood pressure is low.  He is spitting out medications and swelling at the nursing staff.  Would not take anything oral even food.  Spoke with patient's guardian and he will come visit tomorrow to assess.  He needs a lot of paperwork and needs to present case to his supervisor in order to get a DNR. 5/22.  Sodium up at 164, creatinine 5.16.  Fluid bolus and IV fluids switched over to D5W.  Overall prognosis is poor.  Patient made comfort care measures and DNR on the evening of 5/22. 5/23.  Continue comfort care measures. 12-18-2023.  Patient passed away at 6:56 AM.  Assessment and Plan: * End of life care Patient made comfort care measures in  the evening of 5/22.  Patient also made DNR.  Patient passed away on 12/11/23 and 6:56 AM.  Acute kidney injury superimposed on CKD (HCC) AKI on CKD stage IIIb.  Creatinine 1.62 on 5/17.  Last creatinine 5.16.    Hypernatremia Sodium very elevated at 164.  This is an independent risk factor for mortality.  Patient failed swallow evaluation 2 days ago.  Failure to thrive in adult Patient has a  very poor quality of life with a functional decline.  Patient failed swallow evaluation 2 days ago.    Aggressive behavior due to dementia [facility]) Patient failed swallow evaluation 2 days ago.   Hypothermia Borderline SIRS No evidence of sepsis.  Chest x-ray on 5/21 negative for infection.  Heart failure with preserved ejection fraction (HFpEF)  2D echo May 2024 with EF of 50 to 55% and grade 1 diastolic dysfunction.  No signs of heart failure.  Pressure injury of skin Present on admission see full description below  Dementia (HCC) Baseline agitation in setting of dementia   Complete heart block (HCC) Status post pacemaker  Thrombocytopenia (HCC) Chronic in nature  GERD (gastroesophageal reflux disease) Held PPI  Essential hypertension Held blood pressure medication  Mixed hyperlipidemia Held Lipitor  Acquired hypothyroidism Held Synthroid          Procedures: None  Consultations: Psychiatry and palliative care  The results of significant diagnostics from this hospitalization (including imaging, microbiology, ancillary and laboratory) are listed below for reference.   Significant Diagnostic Studies: DG Chest Port 1 View Result Date: 12/01/2023 CLINICAL DATA:  Altered mental status EXAM: PORTABLE CHEST 1 VIEW COMPARISON:  Chest x-ray 12/10/2023 FINDINGS: A left-sided pacemaker again noted.The heart size and mediastinal contours are within normal limits. Both lungs are clear. The visualized skeletal structures are unremarkable. IMPRESSION: No active disease. Electronically Signed   By: Tyron Gallon M.D.   On: 12/01/2023 17:45   US  Abdomen Limited RUQ (LIVER/GB) Result Date: 11/23/2023 CLINICAL DATA:  Cholelithiasis. EXAM: ULTRASOUND ABDOMEN LIMITED RIGHT UPPER QUADRANT COMPARISON:  CT earlier 11/23/2023. FINDINGS: Gallbladder: Dilated gallbladder. No wall thickening or reported Murphy's sign. Layering sludge. There are also some dependent small stones. Common bile  duct: Diameter: 3 mm Liver: Grossly preserved hepatic echotexture when adjusted for overlapping bowel gas and soft tissue. Small cysts seen once again along the inferior aspect of the right hepatic lobe which is near anechoic with through transmission. Thin septation. This measures 17 mm. Please correlate with prior CT portal vein is patent on color Doppler imaging with normal direction of blood flow towards the liver. Other: None. IMPRESSION: Dilated gallbladder with sludge and some small stones. No further sonographic evidence of acute cholecystitis. If there is further concern a HIDA scan could be considered. No biliary ductal dilatation. Electronically Signed   By: Adrianna Horde M.D.   On: 11/23/2023 14:23   CT ABDOMEN PELVIS WO CONTRAST Result Date: 11/23/2023 CLINICAL DATA:  Abdominal pain acute nonlocalized EXAM: CT ABDOMEN AND PELVIS WITHOUT CONTRAST TECHNIQUE: Multidetector CT imaging of the abdomen and pelvis was performed following the standard protocol without IV contrast. RADIATION DOSE REDUCTION: This exam was performed according to the departmental dose-optimization program which includes automated exposure control, adjustment of the mA and/or kV according to patient size and/or use of iterative reconstruction technique. COMPARISON:  March 01, 2012 FINDINGS: Lower chest: Extensive coronary artery calcifications Hepatobiliary: Liver normal size without parenchymal lesions. Gallbladder mildly distended with gallstones without gallbladder inflammatory changes could correlate with chronic cholelithiasis. Pancreas: Unremarkable. No pancreatic  ductal dilatation or surrounding inflammatory changes. Spleen: Normal in size without focal abnormality. Adrenals/Urinary Tract: Adrenal glands are unremarkable. Kidneys are normal, without renal calculi, focal lesion, or hydronephrosis. Bladder is unremarkable. Exophytic cyst in the posterior cortical region of the left kidney lower pole 1.4 cm. Enter Bosniak 1  Stomach/Bowel: Stomach is within normal limits. Appendix appears normal. No evidence of bowel wall thickening, distention, or inflammatory changes. No evidence of appendicitis. No evidence of diverticulitis. Moderate amount of residual fecal material throughout colon and in fecal impaction rectum without obstruction Vascular/Lymphatic: Aortic atherosclerosis. No enlarged abdominal or pelvic lymph nodes. Reproductive: Prostate is unremarkable. Other: No abdominal wall hernia or abnormality. No abdominopelvic ascites. Musculoskeletal: No acute or significant osseous findings. IMPRESSION: *Moderate amount of residual fecal material throughout colon and in fecal impaction rectum without obstruction. *Cholelithiasis without evidence of acute cholecystitis. *Aortic atherosclerosis. Electronically Signed   By: Fredrich Jefferson M.D.   On: 11/23/2023 08:50   CT CHEST WO CONTRAST Result Date: 11/23/2023 CLINICAL DATA:  89 year old male with altered mental status. Respiratory illness. EXAM: CT CHEST WITHOUT CONTRAST TECHNIQUE: Multidetector CT imaging of the chest was performed following the standard protocol without IV contrast. RADIATION DOSE REDUCTION: This exam was performed according to the departmental dose-optimization program which includes automated exposure control, adjustment of the mA and/or kV according to patient size and/or use of iterative reconstruction technique. COMPARISON:  Chest CT 01/23/2023. Portable chest radiograph 0607 hours today. CT Abdomen and Pelvis 2013. FINDINGS: Cardiovascular: Chronic left chest cardiac pacemaker. Calcified aortic atherosclerosis. calcified coronary artery atherosclerosis. Mild cardiomegaly appears increased since last year. No pericardial effusion. Vascular patency is not evaluated in the absence of IV contrast. Mediastinum/Nodes: Negative for mediastinal mass or lymphadenopathy. Lungs/Pleura: Lower lung volumes compared to last year. Major airways remain patent. Generalized  bronchial wall thickening Cha peers new or increased from last year (such as in both lower lobes on series 3, image 80. Chronic subpleural lung scarring. Superimposed increased but symmetric dependent atelectasis. No consolidation. No pleural effusion. No convincing pulmonary edema. Upper Abdomen: Chronic elevation of the right hemidiaphragm. Mildly nodular liver contour. Small but circumscribed 10 mm low-density area along the inferior right hepatic lobe series 2, image 136. This was apparent on a 2013 CT Abdomen and Pelvis, stable and benign (no follow-up imaging recommended). faint dependent tiny gallstones or sludge series 2, image 137. Negative visible noncontrast pancreas, adrenal glands, bowel in the upper air abdomen In the upper abdomen. Splenomegaly is partially visible (coronal image 109). Musculoskeletal: Thoracic spine degeneration with bulky endplate osteophytes and occasional chronic thoracic interbody ankylosis. No acute or suspicious osseous lesion. IMPRESSION: 1. Lower lung volumes with atelectasis, chronic subpleural lung scarring. But also evidence of generally increased bronchial wall thickening. Consider Acute Bronchitis. No consolidation or pleural effusion. 2. Partially visible splenomegaly, and partially visible nodular liver contour - constellation suspicious for Cirrhosis with portal venous hypertension. Correlation with LFTs might be valuable. 3. Mild cardiomegaly appears increased since last year. Chronic cardiac pacemaker. Aortic Atherosclerosis (ICD10-I70.0). Electronically Signed   By: Marlise Simpers M.D.   On: 11/23/2023 08:36   CT Head Wo Contrast Result Date: 11/23/2023 CLINICAL DATA:  Mental status changes.  06/06/2023 EXAM: CT HEAD WITHOUT CONTRAST TECHNIQUE: Contiguous axial images were obtained from the base of the skull through the vertex without intravenous contrast. RADIATION DOSE REDUCTION: This exam was performed according to the departmental dose-optimization program which  includes automated exposure control, adjustment of the mA and/or kV according to patient size and/or  use of iterative reconstruction technique. COMPARISON:  None Available. FINDINGS: Brain: There is no evidence for acute hemorrhage, hydrocephalus, mass lesion, or abnormal extra-axial fluid collection. No definite CT evidence for acute infarction. Diffuse loss of parenchymal volume is consistent with atrophy. Patchy low attenuation in the deep hemispheric and periventricular white matter is nonspecific, but likely reflects chronic microvascular ischemic demyelination. Vascular: No hyperdense vessel or unexpected calcification. Skull: No evidence for fracture. No worrisome lytic or sclerotic lesion. Sinuses/Orbits: Paranasal sinuses are clear. Small right mastoid effusion is stable. Other: None. IMPRESSION: 1. No acute intracranial abnormality. 2. Atrophy with chronic small vessel ischemic disease. 3. Stable small right mastoid effusion. Electronically Signed   By: Donnal Fusi M.D.   On: 11/23/2023 07:24   DG Chest Port 1 View Result Date: 11/23/2023 CLINICAL DATA:  Weakness EXAM: PORTABLE CHEST 1 VIEW COMPARISON:  10/30/2023 FINDINGS: Mild cardiomegaly. Left chest multi lead pacer. Unchanged mild, diffuse interstitial opacity and possible small layering pleural effusions. No acute osseous findings. IMPRESSION: Mild cardiomegaly with unchanged mild, diffuse interstitial opacity and possible small layering pleural effusions, most consistent with edema. No focal airspace opacity. Electronically Signed   By: Fredricka Jenny M.D.   On: 11/23/2023 06:52      Time spent: 5 minutes. Patient was not seen on the day of passing away.  Did speak with nursing staff and charge nurse.  Will not charge for today.  Signed: Verla Glaze, MD 11/22/2023

## 2023-12-12 NOTE — Progress Notes (Signed)
 [facility] notified that pt expired, AC and MD [place] aware

## 2023-12-12 NOTE — Plan of Care (Signed)
 Patient remains on comfort care.  Remains non-responsive.

## 2023-12-12 NOTE — TOC Transition Note (Signed)
 Transition of Care [facility]) - Discharge Note   Patient Details  Name: Hunter [name] MRN: [id number] Date of Birth: 01/16/1934  Transition [facility] CM/SW Contact:  Alexandra Ice, RN Phone Number: 12/09/2023, 7:49 AM   Clinical Narrative:    Patient expired this morning.    Final next level of care: Expired Barriers to Discharge: Barriers Resolved   Patient Goals and CMS Choice   CMS Medicare.gov Compare Post Acute Care list provided to:: Legal Guardian        Discharge Placement                  Name of family member notified: Legal guardian Patient and family notified of of transfer: 11/14/2023  Discharge Plan and Services Additional resources added to the After Visit Summary for       Post Acute Care Choice:  (TBD)            DME Agency: NA       HH Arranged: NA          Social Drivers of Health (SDOH) Interventions SDOH Screenings   Food Insecurity: Patient Unable To Answer (11/23/2023)  Housing: Patient Unable To Answer (11/23/2023)  Transportation Needs: Patient Unable To Answer (11/23/2023)  Utilities: Low Risk  (06/13/2023)   Received from [facility]  Alcohol Screen: Low Risk  (07/06/2021)  Depression (PHQ2-9): Medium Risk (11/26/2022)  Financial Resource Strain: Low Risk  (06/13/2023)   Received from [facility]  Physical Activity: Insufficiently Active (06/13/2023)   Received from [facility]  Social Connections: Patient Unable To Answer (11/23/2023)  Stress: No Stress Concern Present (06/13/2023)   Received from [facility]  Tobacco Use: Low Risk  (11/25/2023)  Health Literacy: Medium Risk (06/13/2023)   Received from [facility]     Readmission Risk Interventions     No data to display

## 2023-12-12 NOTE — Progress Notes (Signed)
 Patient has  expired at ~ [phone].  MD notified.  Caregiver notified.  Post-mortem care to be performed by hospital staff.

## 2023-12-12 DEATH — deceased
